# Patient Record
Sex: Female | Born: 1940
Health system: Southern US, Community
[De-identification: ages and names within clinical notes are randomized; demographics above are authoritative.]

## PROBLEM LIST (undated history)

## (undated) DIAGNOSIS — I739 Peripheral vascular disease, unspecified: Secondary | ICD-10-CM

## (undated) DIAGNOSIS — N952 Postmenopausal atrophic vaginitis: Secondary | ICD-10-CM

## (undated) DIAGNOSIS — E7800 Pure hypercholesterolemia, unspecified: Secondary | ICD-10-CM

## (undated) DIAGNOSIS — N200 Calculus of kidney: Secondary | ICD-10-CM

## (undated) DIAGNOSIS — I2510 Atherosclerotic heart disease of native coronary artery without angina pectoris: Secondary | ICD-10-CM

## (undated) DIAGNOSIS — G4700 Insomnia, unspecified: Secondary | ICD-10-CM

## (undated) DIAGNOSIS — H8110 Benign paroxysmal vertigo, unspecified ear: Secondary | ICD-10-CM

## (undated) DIAGNOSIS — Z87891 Personal history of nicotine dependence: Secondary | ICD-10-CM

## (undated) DIAGNOSIS — M069 Rheumatoid arthritis, unspecified: Secondary | ICD-10-CM

## (undated) DIAGNOSIS — M109 Gout, unspecified: Secondary | ICD-10-CM

## (undated) DIAGNOSIS — Z9049 Acquired absence of other specified parts of digestive tract: Secondary | ICD-10-CM

## (undated) DIAGNOSIS — F419 Anxiety disorder, unspecified: Secondary | ICD-10-CM

## (undated) DIAGNOSIS — N209 Urinary calculus, unspecified: Secondary | ICD-10-CM

## (undated) DIAGNOSIS — Z90710 Acquired absence of both cervix and uterus: Secondary | ICD-10-CM

## (undated) DIAGNOSIS — E079 Disorder of thyroid, unspecified: Secondary | ICD-10-CM

## (undated) DIAGNOSIS — L309 Dermatitis, unspecified: Secondary | ICD-10-CM

## (undated) DIAGNOSIS — I10 Essential (primary) hypertension: Secondary | ICD-10-CM

## (undated) DIAGNOSIS — E119 Type 2 diabetes mellitus without complications: Secondary | ICD-10-CM

## (undated) DIAGNOSIS — C4490 Unspecified malignant neoplasm of skin, unspecified: Secondary | ICD-10-CM

## (undated) DIAGNOSIS — M1990 Unspecified osteoarthritis, unspecified site: Secondary | ICD-10-CM

## (undated) DIAGNOSIS — Z8489 Family history of other specified conditions: Secondary | ICD-10-CM

## (undated) DIAGNOSIS — K219 Gastro-esophageal reflux disease without esophagitis: Secondary | ICD-10-CM

## (undated) DIAGNOSIS — E042 Nontoxic multinodular goiter: Secondary | ICD-10-CM

## (undated) DIAGNOSIS — N3000 Acute cystitis without hematuria: Secondary | ICD-10-CM

## (undated) DIAGNOSIS — G4730 Sleep apnea, unspecified: Secondary | ICD-10-CM

## (undated) DIAGNOSIS — N816 Rectocele: Secondary | ICD-10-CM

## (undated) DIAGNOSIS — N815 Vaginal enterocele: Secondary | ICD-10-CM

## (undated) DIAGNOSIS — R339 Retention of urine, unspecified: Secondary | ICD-10-CM

## (undated) HISTORY — DX: Postmenopausal atrophic vaginitis: N95.2

## (undated) HISTORY — DX: Acquired absence of both cervix and uterus: Z90.710

## (undated) HISTORY — DX: Anxiety disorder, unspecified: F41.9

## (undated) HISTORY — PX: CHOLECYSTECTOMY: SHX55

## (undated) HISTORY — DX: Gastro-esophageal reflux disease without esophagitis: K21.9

## (undated) HISTORY — DX: Acquired absence of other specified parts of digestive tract: Z90.49

## (undated) HISTORY — DX: Unspecified malignant neoplasm of skin, unspecified: C44.90

## (undated) HISTORY — DX: Personal history of nicotine dependence: Z87.891

## (undated) HISTORY — DX: Urinary calculus, unspecified: N20.9

## (undated) HISTORY — DX: Pure hypercholesterolemia, unspecified: E78.00

## (undated) HISTORY — DX: Rectocele: N81.6

## (undated) HISTORY — PX: COLONOSCOPY: SHX174

## (undated) HISTORY — DX: Sleep apnea, unspecified: G47.30

## (undated) HISTORY — DX: Type 2 diabetes mellitus without complications: E11.9

## (undated) HISTORY — DX: Disorder of thyroid, unspecified: E07.9

## (undated) HISTORY — PX: GALLBLADDER SURGERY: SHX652

## (undated) HISTORY — DX: Gout, unspecified: M10.9

## (undated) HISTORY — DX: Retention of urine, unspecified: R33.9

## (undated) HISTORY — DX: Benign paroxysmal vertigo, unspecified ear: H81.10

## (undated) HISTORY — DX: Vaginal enterocele: N81.5

## (undated) HISTORY — DX: Dermatitis, unspecified: L30.9

## (undated) HISTORY — DX: Insomnia, unspecified: G47.00

## (undated) HISTORY — DX: Acute cystitis without hematuria: N30.00

## (undated) HISTORY — PX: PARTIAL HYSTERECTOMY: SHX80

## (undated) HISTORY — DX: Calculus of kidney: N20.0

## (undated) HISTORY — DX: Unspecified osteoarthritis, unspecified site: M19.90

## (undated) HISTORY — PX: CATARACT EXTRACTION, BILATERAL: SHX1313

---

## 2003-08-14 ENCOUNTER — Ambulatory Visit (HOSPITAL_COMMUNITY): Admission: RE | Admit: 2003-08-14 | Discharge: 2003-08-14 | Payer: Self-pay | Admitting: Family Medicine

## 2004-03-03 ENCOUNTER — Ambulatory Visit (HOSPITAL_BASED_OUTPATIENT_CLINIC_OR_DEPARTMENT_OTHER): Admission: RE | Admit: 2004-03-03 | Discharge: 2004-03-03 | Payer: Self-pay | Admitting: Cardiology

## 2004-03-20 ENCOUNTER — Ambulatory Visit (HOSPITAL_COMMUNITY): Admission: RE | Admit: 2004-03-20 | Discharge: 2004-03-20 | Payer: Self-pay | Admitting: Gastroenterology

## 2004-03-20 ENCOUNTER — Encounter (INDEPENDENT_AMBULATORY_CARE_PROVIDER_SITE_OTHER): Payer: Self-pay | Admitting: Specialist

## 2004-05-06 ENCOUNTER — Ambulatory Visit: Payer: Self-pay | Admitting: Pulmonary Disease

## 2010-02-06 ENCOUNTER — Encounter: Admission: RE | Admit: 2010-02-06 | Discharge: 2010-02-06 | Payer: Self-pay | Admitting: Internal Medicine

## 2010-02-11 ENCOUNTER — Encounter: Admission: RE | Admit: 2010-02-11 | Discharge: 2010-02-11 | Payer: Self-pay | Admitting: Internal Medicine

## 2010-02-11 ENCOUNTER — Other Ambulatory Visit: Admission: RE | Admit: 2010-02-11 | Discharge: 2010-02-11 | Payer: Self-pay | Admitting: Interventional Radiology

## 2010-06-10 ENCOUNTER — Encounter
Admission: RE | Admit: 2010-06-10 | Discharge: 2010-06-10 | Payer: Self-pay | Source: Home / Self Care | Admitting: Internal Medicine

## 2010-09-29 ENCOUNTER — Other Ambulatory Visit: Payer: Self-pay | Admitting: Obstetrics and Gynecology

## 2010-09-29 DIAGNOSIS — R102 Pelvic and perineal pain: Secondary | ICD-10-CM

## 2010-10-06 ENCOUNTER — Ambulatory Visit (HOSPITAL_COMMUNITY)
Admission: RE | Admit: 2010-10-06 | Discharge: 2010-10-06 | Disposition: A | Discharge status: Home / Self Care | Payer: Medicare Other | Source: Ambulatory Visit | Attending: Obstetrics and Gynecology | Admitting: Obstetrics and Gynecology

## 2010-10-06 DIAGNOSIS — N949 Unspecified condition associated with female genital organs and menstrual cycle: Secondary | ICD-10-CM | POA: Insufficient documentation

## 2010-10-06 DIAGNOSIS — R102 Pelvic and perineal pain: Secondary | ICD-10-CM

## 2010-10-06 DIAGNOSIS — Z90710 Acquired absence of both cervix and uterus: Secondary | ICD-10-CM | POA: Insufficient documentation

## 2010-11-20 NOTE — Op Note (Signed)
Annette Suarez, Annette Suarez                            ACCOUNT NO.:  [id number]   MEDICAL RECORD NO.:  [id number]                   PATIENT TYPE:  AMB   LOCATION:  ENDO                                 FACILITY:  [facility]   PHYSICIAN:  John C. Madilyn Fireman, M.D.                 DATE OF BIRTH:  1941/04/16   DATE OF PROCEDURE:  03/20/2004  DATE OF DISCHARGE:                                 OPERATIVE REPORT   OPERATION:  Colonoscopy with polypectomy.   INDICATIONS FOR PROCEDURE:  Heme positive stool.   PROCEDURE:  The patient was placed in the left lateral decubitus position  and placed on the pulse monitor with continuous low flow oxygen delivered by  nasal cannula.  She was sedated with 75 mcg IV Fentanyl and 8 mg IV Versed.  The Olympus video colonoscope was inserted into the rectum and advanced to  the cecum, confirmed by transillumination of McBurney's point and  visualization of the ileocecal valve and appendiceal orifice.  The prep was  excellent.  The cecum, ascending, transverse, descending, and sigmoid colon  all appeared normal with no masses, polyps, diverticula or other mucosal  abnormalities.  At the rectosigmoid junction, there was an 8 mm relatively  flat polyp which was fulgurated by hot biopsy.  The remainder of the rectum  and sigmoid appeared normal down to the anus where a retroflex view did  reveal some small internal hemorrhoids.  The scope was then withdrawn and  the patient returned to the recovery room in stable condition.  She  tolerated the procedure well and there were no immediate complications.   IMPRESSION:  1.  Small rectosigmoid colon polyp.  2.  Small internal hemorrhoids.   PLAN:  Await histology to determine method and interval for future colon  screening.                                               John C. Madilyn Fireman, M.D.    JCH/MEDQ  D:  03/20/2004  T:  03/20/2004  Job:  [id number]   cc:   Holley Bouche, M.D.  [address]  [place], [place] [address]  Fax: [phone]

## 2010-11-20 NOTE — Procedures (Signed)
NAME:  Annette Suarez, Annette Suarez                ACCOUNT NO.:  [id number]   MEDICAL RECORD NO.:  [id number]          PATIENT TYPE:  OUT   LOCATION:  SLEEP CENTER                 FACILITY:  [facility]   PHYSICIAN:  Clinton D. Maple Hudson, M.D. DATE OF BIRTH:  09-05-1940   DATE OF ADMISSION:  03/03/2004  DATE OF DISCHARGE:  03/03/2004                              NOCTURNAL POLYSOMNOGRAM   REFERRING PHYSICIAN:  Dr. Armanda Magic   INDICATION FOR STUDY:  Hypersomnia with sleep apnea, daytime fatigue,  complains of waking at night with a jerk.   EPWORTH SLEEPINESS SCORE:  6/24   NECK SIZE:  15 inches.   BMI:  27.   WEIGHT:  151 pounds.   MEDICATION LIST:  Estradiol, Glipizide, enalapril, Zocor, aspirin, calcium.   SLEEP ARCHITECTURE:  Short total sleep time 283 minutes with sleep  efficiency 57%.  Stage I was 6%, Stage II 44%, Stages III 23%.  REM was 27%  of total sleep time.  Latency to sleep onset 48 minutes, latency to REM 85  minutes, awake after sleep onset 162 minutes, arousal index 23/hr.   RESPIRATORY DATA:  Split-study protocol. RDI 18/hr before CPAP reflecting 2  obstructive apneas and 47 hypopneas before CPAP, consistent with mild  obstructive sleep apnea. Events were not positional. REM RDI 32/hr. CPAP was  titrated to 10 CWP, RDI 06/hr.  A Respironics Comfort Curve Mask with small  nasal pillows and heated humidifier was used.   OXYGEN DATA:  Moderate snoring before CPAP with mild to moderate oxygen  desaturation briefly to 85%. Mean oxygen saturation through the study 95-96%  and oxygenation was normal with CPAP control.   CARDIAC DATA:  Sinus rhythm with PACs.   MOVEMENT/PARASOMNIA:  38 limb jerks were recorded of which 3 were associated  with arousal or awakening for a periodic limb movement with arousal index of  0.6/hr.   IMPRESSION/RECOMMENDATION:  Mild obstructive sleep apnea/hypopnea syndrome,  respiratory disturbance index 18/hr.  Good control on CPAP at 10 CWP, using  a small  Comfort Curv Respironics Mask with heated humidifier.                                   ______________________________                                Rennis Chris Maple Hudson, M.D.                                Diplomate, Biomedical engineer of Sleep Medicine    CDY/MEDQ  D:  03/08/2004 12:27:32  T:  03/09/2004 15:34:41  Job:  [id number]

## 2010-12-23 ENCOUNTER — Other Ambulatory Visit: Payer: Self-pay | Admitting: Family Medicine

## 2010-12-23 DIAGNOSIS — G8929 Other chronic pain: Secondary | ICD-10-CM

## 2010-12-30 ENCOUNTER — Ambulatory Visit
Admission: RE | Admit: 2010-12-30 | Discharge: 2010-12-30 | Disposition: A | Discharge status: Home / Self Care | Payer: Medicare Other | Source: Ambulatory Visit | Attending: Family Medicine | Admitting: Family Medicine

## 2010-12-30 DIAGNOSIS — M542 Cervicalgia: Secondary | ICD-10-CM

## 2010-12-30 DIAGNOSIS — G8929 Other chronic pain: Secondary | ICD-10-CM

## 2011-05-12 ENCOUNTER — Other Ambulatory Visit: Payer: Self-pay | Admitting: Internal Medicine

## 2011-05-12 DIAGNOSIS — E042 Nontoxic multinodular goiter: Secondary | ICD-10-CM

## 2011-05-17 ENCOUNTER — Ambulatory Visit
Admission: RE | Admit: 2011-05-17 | Discharge: 2011-05-17 | Disposition: A | Discharge status: Home / Self Care | Payer: Medicare Other | Source: Ambulatory Visit | Attending: Internal Medicine | Admitting: Internal Medicine

## 2011-05-17 DIAGNOSIS — E042 Nontoxic multinodular goiter: Secondary | ICD-10-CM

## 2011-09-09 ENCOUNTER — Ambulatory Visit: Payer: Medicare Other | Admitting: Gynecology

## 2012-02-17 ENCOUNTER — Other Ambulatory Visit: Payer: Self-pay | Admitting: Family Medicine

## 2012-02-17 DIAGNOSIS — H539 Unspecified visual disturbance: Secondary | ICD-10-CM

## 2012-02-23 ENCOUNTER — Other Ambulatory Visit: Payer: Medicare Other

## 2012-02-24 ENCOUNTER — Ambulatory Visit
Admission: RE | Admit: 2012-02-24 | Discharge: 2012-02-24 | Disposition: A | Discharge status: Home / Self Care | Payer: Medicare Other | Source: Ambulatory Visit | Attending: Family Medicine | Admitting: Family Medicine

## 2012-02-24 DIAGNOSIS — H539 Unspecified visual disturbance: Secondary | ICD-10-CM

## 2012-05-01 ENCOUNTER — Other Ambulatory Visit: Payer: Self-pay | Admitting: Internal Medicine

## 2012-05-01 DIAGNOSIS — E049 Nontoxic goiter, unspecified: Secondary | ICD-10-CM

## 2012-05-10 ENCOUNTER — Ambulatory Visit
Admission: RE | Admit: 2012-05-10 | Discharge: 2012-05-10 | Disposition: A | Discharge status: Home / Self Care | Payer: Medicare Other | Source: Ambulatory Visit | Attending: Internal Medicine | Admitting: Internal Medicine

## 2012-05-10 DIAGNOSIS — E049 Nontoxic goiter, unspecified: Secondary | ICD-10-CM

## 2014-08-08 ENCOUNTER — Other Ambulatory Visit: Payer: Self-pay | Admitting: Dermatology

## 2015-04-15 ENCOUNTER — Other Ambulatory Visit: Payer: Self-pay | Admitting: Internal Medicine

## 2015-04-15 DIAGNOSIS — E042 Nontoxic multinodular goiter: Secondary | ICD-10-CM

## 2015-04-23 ENCOUNTER — Ambulatory Visit
Admission: RE | Admit: 2015-04-23 | Discharge: 2015-04-23 | Disposition: A | Discharge status: Home / Self Care | Payer: Medicare Other | Source: Ambulatory Visit | Attending: Internal Medicine | Admitting: Internal Medicine

## 2015-04-23 DIAGNOSIS — E042 Nontoxic multinodular goiter: Secondary | ICD-10-CM

## 2016-03-25 ENCOUNTER — Other Ambulatory Visit: Payer: Self-pay | Admitting: Gastroenterology

## 2016-03-25 DIAGNOSIS — R1310 Dysphagia, unspecified: Secondary | ICD-10-CM

## 2016-03-30 ENCOUNTER — Ambulatory Visit
Admission: RE | Admit: 2016-03-30 | Discharge: 2016-03-30 | Disposition: A | Discharge status: Home / Self Care | Payer: Medicare Other | Source: Ambulatory Visit | Attending: Gastroenterology | Admitting: Gastroenterology

## 2016-03-30 DIAGNOSIS — R1310 Dysphagia, unspecified: Secondary | ICD-10-CM

## 2016-06-10 ENCOUNTER — Other Ambulatory Visit (HOSPITAL_COMMUNITY): Payer: Self-pay | Admitting: Family Medicine

## 2016-06-10 ENCOUNTER — Other Ambulatory Visit: Payer: Self-pay | Admitting: Family Medicine

## 2016-06-10 ENCOUNTER — Ambulatory Visit
Admission: RE | Admit: 2016-06-10 | Discharge: 2016-06-10 | Disposition: A | Discharge status: Home / Self Care | Payer: Medicare Other | Source: Ambulatory Visit | Attending: Family Medicine | Admitting: Family Medicine

## 2016-06-10 DIAGNOSIS — M25541 Pain in joints of right hand: Secondary | ICD-10-CM

## 2016-06-14 ENCOUNTER — Other Ambulatory Visit: Payer: Self-pay | Admitting: Family Medicine

## 2016-06-14 DIAGNOSIS — M79641 Pain in right hand: Secondary | ICD-10-CM

## 2016-06-15 ENCOUNTER — Ambulatory Visit
Admission: RE | Admit: 2016-06-15 | Discharge: 2016-06-15 | Disposition: A | Discharge status: Home / Self Care | Payer: Medicare Other | Source: Ambulatory Visit | Attending: Family Medicine | Admitting: Family Medicine

## 2016-06-15 ENCOUNTER — Other Ambulatory Visit: Payer: Self-pay | Admitting: Family Medicine

## 2016-06-15 DIAGNOSIS — M79641 Pain in right hand: Secondary | ICD-10-CM

## 2016-06-15 DIAGNOSIS — R9389 Abnormal findings on diagnostic imaging of other specified body structures: Secondary | ICD-10-CM

## 2016-06-15 MED ORDER — GADOBENATE DIMEGLUMINE 529 MG/ML IV SOLN
15.0000 mL | Freq: Once | INTRAVENOUS | Status: AC | PRN
  Administered 2016-06-15: 13 mL via INTRAVENOUS

## 2016-06-25 ENCOUNTER — Other Ambulatory Visit: Payer: Medicare Other

## 2016-07-05 HISTORY — PX: CARPAL TUNNEL RELEASE: SHX101

## 2016-09-06 ENCOUNTER — Telehealth: Payer: Self-pay | Admitting: Rheumatology

## 2016-09-06 NOTE — Telephone Encounter (Signed)
Opened in error

## 2016-09-07 NOTE — Progress Notes (Signed)
Office Visit Note  Patient: Annette Suarez             Date of Birth: [birth year]           MRN: [id number]             PCP: Shirline Frees, MD Referring: Delrae Rend, MD Visit Date: 09/08/2016 Occupation: @GUAROCC @    Subjective:  Pain hands   History of Present Illness: Annette Suarez is a 76 y.o. female seen in consultation per request of Dr. Buddy Duty. According to patient in early December she started experiencing hand pain and swelling. At the time she was seen by her PCP and had x-rays of her bilateral hands which were abnormal as was followed by MRI of her hands which was also abnormal. She was given some medication which did not help. She also had some lab work which was unremarkable. She states she was given meloxicam and Tylenol arthritis was advised. It is not helping her much. She also complains of Raynaud's phenomena  which is been lasting for over a year now. She denies pain and swelling in any other joints.  Activities of Daily Living:  Patient reports morning stiffness for 4-5 hours.   Patient Reports nocturnal pain.  Difficulty dressing/grooming: Denies Difficulty climbing stairs: Denies Difficulty getting out of chair: Denies Difficulty using hands for taps, buttons, cutlery, and/or writing: Reports   Review of Systems  Constitutional: Positive for fatigue. Negative for night sweats, weight gain, weight loss and weakness.  HENT: Positive for mouth dryness. Negative for mouth sores, trouble swallowing, trouble swallowing and nose dryness.   Eyes: Negative for pain, redness, visual disturbance and dryness.  Respiratory: Negative for cough, shortness of breath and difficulty breathing.   Cardiovascular: Negative for chest pain, palpitations, hypertension, irregular heartbeat and swelling in legs/feet.  Gastrointestinal: Negative for blood in stool, constipation and diarrhea.  Endocrine: Negative for increased urination.  Genitourinary: Negative for vaginal dryness.    Musculoskeletal: Positive for arthralgias, joint pain, joint swelling and morning stiffness. Negative for myalgias, muscle weakness, muscle tenderness and myalgias.  Skin: Positive for color change. Negative for rash, hair loss, skin tightness, ulcers and sensitivity to sunlight.  Allergic/Immunologic: Negative for susceptible to infections.  Neurological: Negative for dizziness, memory loss and night sweats.  Hematological: Negative for swollen glands.  Psychiatric/Behavioral: Positive for sleep disturbance. Negative for depressed mood. The patient is not nervous/anxious.     PMFS History:  Patient Active Problem List   Diagnosis Date Noted  . Pain in both hands 09/08/2016  . Essential hypertension 09/08/2016  . Type 2 diabetes mellitus  09/08/2016  . Gastroesophageal reflux disease without esophagitis 09/08/2016  . Osteopenia of multiple sites 09/08/2016  . History of anxiety 09/08/2016  . Benign paroxysmal positional vertigo 09/08/2016  . Smoker 09/08/2016  . Sleep apnea 09/08/2016    No past medical history on file.  No family history on file. No past surgical history on file. Social History   Social History Narrative  . No narrative on file     Objective: Vital Signs: BP 138/78 (BP Location: Right Arm)   Pulse 78   Resp 16   Ht 5\' 1"  (1.549 m)   Wt 150 lb (68 kg)   BMI 28.34 kg/m    Physical Exam  Constitutional: She is oriented to person, place, and time. She appears well-developed and well-nourished.  HENT:  Head: Normocephalic and atraumatic.  Eyes: Conjunctivae and EOM are normal.  Neck: Normal range of motion.  Cardiovascular: Normal rate, regular rhythm, normal heart sounds and intact distal pulses.   Pulmonary/Chest: Effort normal and breath sounds normal.  Abdominal: Soft. Bowel sounds are normal.  Lymphadenopathy:    She has no cervical adenopathy.  Neurological: She is alert and oriented to person, place, and time.  Skin: Skin is warm and dry.  Capillary refill takes less than 2 seconds.  Psychiatric: She has a normal mood and affect. Her behavior is normal.  Nursing note and vitals reviewed.    Musculoskeletal Exam: C-spine, thoracic spine good range of motion. Shoulder joints elbow joints are good range of motion. She has limited range of motion of bilateral wrist joints right wrist joint extension 40 and flexion 20, left wrist joint synovitis with extension 40 and flexion 45 she has synovitis on palpation of bilateral MCPs and PIP joints as depicted below. Hip joints knee joints ankles MTPs PIPs DIPs with good range of motion with no synovitis.  CDAI Exam: CDAI Homunculus Exam:   Tenderness:  RUE: wrist LUE: wrist Right hand: 1st MCP, 2nd MCP, 3rd MCP, 4th MCP, 5th MCP, 2nd PIP, 3rd PIP, 4th PIP and 5th PIP Left hand: 1st MCP, 2nd MCP, 3rd MCP, 4th MCP, 5th MCP, 2nd PIP, 3rd PIP, 4th PIP and 5th PIP  Swelling:  RUE: wrist LUE: wrist Right hand: 1st MCP, 2nd MCP, 3rd MCP, 5th MCP, 2nd PIP, 3rd PIP, 4th PIP and 5th PIP Left hand: 2nd MCP, 3rd MCP, 5th MCP, 2nd PIP, 3rd PIP, 4th PIP and 5th PIP  Joint Counts:  CDAI Tender Joint count: 20 CDAI Swollen Joint count: 17  Global Assessments:  Patient Global Assessment: 8 Provider Global Assessment: 8  CDAI Calculated Score: 53    Investigation: Findings:  07/15/2016 RF negative, ANA negative 04/12/2016 CMP normal, hemoglobin A1c 7.8, TSH 1.24 02/20/2016 CBC WBC 11.1, hemoglobin 13.9, platelets 317 IMPRESSION: MRI hand 1. Synovitis and suspected mild erosions along the index finger MCP joint. Lesser degree of synovitis in the fifth MCP joint and along the ring finger proximal interphalangeal joint. There is some scattered small erosions. Appearance suspicious for erosive arthropathy such as rheumatoid arthropathy. Given the multifocal involvement, I doubt that this is an example of septic joint. 2. Superimposed osteoarthritis especially notable in  the interphalangeal joints. Electronically Signed   By: Van Clines M.D.   On: 06/16/2016 09:02 06/15/2012 DEXA T score -1.6 left femoral    Imaging: Xr Hand 2 View Left  Result Date: 09/08/2016 Juxta articular osteopenia. Left second MCP joint narrowing was noted. All PIP joint space narrowing was noted. No erosive changes were noted. Impression: These signs are consistent with inflammatory arthritis and osteoarthritis overlap   Speciality Comments: No specialty comments available.    Procedures:  No procedures performed Allergies: Latex   Assessment / Plan:     Visit Diagnoses: Rheumatoid arthritis seronegative: Patient is symmetrical inflammatory arthritis involving wrist joint MCPs and PIP joints. She had MRI revealing synovitis and possible erosive changes. All her autoimmune workup so far including ANA and rheumatoid factor is negative. She's alarmed discomfort with nocturnal pain and incomplete fist formation she's having difficulty doing routine activities at home. She will benefit from prednisone as a bridging therapy but I'm hesitant to give her an adequate dose due to poorly controlled blood sugars. She states her blood sugar last night was 180. My plan is to keep her on prednisone 5 mg by mouth daily to relieve some of the stiffness in her hands. I will also  obtain following labs and plan to start her on methotrexate after the lab work. She'll also need folic acid 1 mg 2 tablets by mouth daily. We will have to monitor her blood work very closely every 2 weeks 3 and then every 2 months to monitor for drug toxicity. Indications side effects contraindications of methotrexate were discussed at length by me and Dr. Koleen Nimrod today. We will obtain an informed consent. A baseline chest x-ray will be also obtained. May need a pneumococcal vaccine as well.  Essential hypertension: Her blood pressure is fairly well controlled.  Type 2 diabetes mellitus : I have advised her to  monitor her blood sugars closely and then follow-up with Dr. Buddy Duty.  Dyslipidemia: According to patient her lipids have not been high and she is taking statins because of history of diabetes.  Gastroesophageal reflux disease without esophagitis: Not very symptomatic  History of anxiety  Benign paroxysmal positional vertigo  Sleep apnea  Former smoker   Orders: Orders Placed This Encounter  Procedures  . XR Hand 2 View Left  . DG Chest 2 View  . CBC with Differential/Platelet  . COMPLETE METABOLIC PANEL WITH GFR  . Sedimentation rate  . Cyclic citrul peptide antibody, IgG  . 14-3-3 eta Protein  . Angiotensin converting enzyme  . Hepatitis C antibody  . Hepatitis B Surface AntiGEN  . Hepatitis B Core Antibody, IgM  . Hepatitis A antibody, IgM  . Serum protein electrophoresis with reflex  . IgG, IgA, IgM  . HIV antibody  . Urinalysis, Routine w reflex microscopic  . Quantiferon tb gold assay (blood)  . CBC with Differential/Platelet  . COMPLETE METABOLIC PANEL WITH GFR   Meds ordered this encounter  Medications  . predniSONE (DELTASONE) 5 MG tablet    Sig: Take 1 tablet (5 mg total) by mouth daily with breakfast.    Dispense:  30 tablet    Refill:  1    Face-to-face time spent with patient was 50 minutes. 50% of time was spent in counseling and coordination of care.  Follow-Up Instructions: Return for Rheumatoid arthritis.   Bo Merino, MD  Note - This record has been created using Editor, commissioning.  Chart creation errors have been sought, but may not always  have been located. Such creation errors do not reflect on  the standard of medical care.

## 2016-09-08 ENCOUNTER — Ambulatory Visit (INDEPENDENT_AMBULATORY_CARE_PROVIDER_SITE_OTHER): Payer: Medicare Other | Admitting: Rheumatology

## 2016-09-08 ENCOUNTER — Ambulatory Visit (HOSPITAL_COMMUNITY)
Admission: RE | Admit: 2016-09-08 | Discharge: 2016-09-08 | Disposition: A | Discharge status: Home / Self Care | Payer: Medicare Other | Source: Ambulatory Visit | Attending: Rheumatology | Admitting: Rheumatology

## 2016-09-08 ENCOUNTER — Ambulatory Visit (INDEPENDENT_AMBULATORY_CARE_PROVIDER_SITE_OTHER): Payer: Medicare Other

## 2016-09-08 ENCOUNTER — Telehealth: Payer: Self-pay | Admitting: Radiology

## 2016-09-08 ENCOUNTER — Encounter: Payer: Self-pay | Admitting: Rheumatology

## 2016-09-08 VITALS — BP 138/78 | HR 78 | Resp 16 | Ht 61.0 in | Wt 150.0 lb

## 2016-09-08 DIAGNOSIS — I10 Essential (primary) hypertension: Secondary | ICD-10-CM | POA: Diagnosis not present

## 2016-09-08 DIAGNOSIS — K219 Gastro-esophageal reflux disease without esophagitis: Secondary | ICD-10-CM | POA: Diagnosis not present

## 2016-09-08 DIAGNOSIS — M79641 Pain in right hand: Secondary | ICD-10-CM | POA: Diagnosis not present

## 2016-09-08 DIAGNOSIS — M79642 Pain in left hand: Secondary | ICD-10-CM | POA: Diagnosis not present

## 2016-09-08 DIAGNOSIS — E118 Type 2 diabetes mellitus with unspecified complications: Secondary | ICD-10-CM

## 2016-09-08 DIAGNOSIS — E1159 Type 2 diabetes mellitus with other circulatory complications: Secondary | ICD-10-CM | POA: Insufficient documentation

## 2016-09-08 DIAGNOSIS — E119 Type 2 diabetes mellitus without complications: Secondary | ICD-10-CM | POA: Insufficient documentation

## 2016-09-08 DIAGNOSIS — Z79899 Other long term (current) drug therapy: Secondary | ICD-10-CM

## 2016-09-08 DIAGNOSIS — M0600 Rheumatoid arthritis without rheumatoid factor, unspecified site: Secondary | ICD-10-CM | POA: Diagnosis not present

## 2016-09-08 DIAGNOSIS — H8110 Benign paroxysmal vertigo, unspecified ear: Secondary | ICD-10-CM

## 2016-09-08 DIAGNOSIS — Z8659 Personal history of other mental and behavioral disorders: Secondary | ICD-10-CM

## 2016-09-08 DIAGNOSIS — Z111 Encounter for screening for respiratory tuberculosis: Secondary | ICD-10-CM | POA: Diagnosis not present

## 2016-09-08 DIAGNOSIS — Z1159 Encounter for screening for other viral diseases: Secondary | ICD-10-CM

## 2016-09-08 DIAGNOSIS — G4730 Sleep apnea, unspecified: Secondary | ICD-10-CM

## 2016-09-08 DIAGNOSIS — M8589 Other specified disorders of bone density and structure, multiple sites: Secondary | ICD-10-CM

## 2016-09-08 DIAGNOSIS — F17200 Nicotine dependence, unspecified, uncomplicated: Secondary | ICD-10-CM

## 2016-09-08 DIAGNOSIS — Z794 Long term (current) use of insulin: Secondary | ICD-10-CM

## 2016-09-08 DIAGNOSIS — Z114 Encounter for screening for human immunodeficiency virus [HIV]: Secondary | ICD-10-CM

## 2016-09-08 DIAGNOSIS — I152 Hypertension secondary to endocrine disorders: Secondary | ICD-10-CM | POA: Insufficient documentation

## 2016-09-08 LAB — COMPLETE METABOLIC PANEL WITH GFR
ALT: 7 U/L (ref 6–29)
AST: 9 U/L — ABNORMAL LOW (ref 10–35)
Albumin: 3.9 g/dL (ref 3.6–5.1)
Alkaline Phosphatase: 84 U/L (ref 33–130)
BUN: 23 mg/dL (ref 7–25)
CO2: 23 mmol/L (ref 20–31)
Calcium: 9.2 mg/dL (ref 8.6–10.4)
Chloride: 100 mmol/L (ref 98–110)
Creat: 0.82 mg/dL (ref 0.60–0.93)
GFR, Est African American: 82 mL/min (ref >=60)
GFR, Est Non African American: 71 mL/min (ref >=60)
Glucose, Bld: 132 mg/dL — ABNORMAL HIGH (ref 65–99)
Potassium: 4.4 mmol/L (ref 3.5–5.3)
Sodium: 140 mmol/L (ref 135–146)
Total Bilirubin: 0.8 mg/dL (ref 0.2–1.2)
Total Protein: 6.9 g/dL (ref 6.1–8.1)

## 2016-09-08 LAB — CBC WITH DIFFERENTIAL/PLATELET
Basophils Absolute: 0 cells/uL (ref 0–200)
Basophils Relative: 0 %
Eosinophils Absolute: 240 cells/uL (ref 15–500)
Eosinophils Relative: 2 %
HCT: 33.5 % — ABNORMAL LOW (ref 35.0–45.0)
Hemoglobin: 11.4 g/dL — ABNORMAL LOW (ref 11.7–15.5)
Lymphocytes Relative: 15 %
Lymphs Abs: 1800 cells/uL (ref 850–3900)
MCH: 30.2 pg (ref 27.0–33.0)
MCHC: 34 g/dL (ref 32.0–36.0)
MCV: 88.6 fL (ref 80.0–100.0)
MPV: 9.7 fL (ref 7.5–12.5)
Monocytes Absolute: 600 cells/uL (ref 200–950)
Monocytes Relative: 5 %
Neutro Abs: 9360 cells/uL — ABNORMAL HIGH (ref 1500–7800)
Neutrophils Relative %: 78 %
Platelets: 426 10*3/uL — ABNORMAL HIGH (ref 140–400)
RBC: 3.78 MIL/uL — ABNORMAL LOW (ref 3.80–5.10)
RDW: 13.4 % (ref 11.0–15.0)
WBC: 12 10*3/uL — ABNORMAL HIGH (ref 3.8–10.8)

## 2016-09-08 MED ORDER — PREDNISONE 5 MG PO TABS: 5.0000 mg | ORAL_TABLET | Freq: Every day | ORAL | 1 refills | Status: DC

## 2016-09-08 NOTE — Progress Notes (Signed)
Pharmacy Note  Subjective: Patient presents today to the [facility] to see Dr. Estanislado Pandy.  Patient seen by the pharmacist for counseling on methotrexate.    Objective: CBC, CMP: ordered today TB Gold: ordered today Hepatitis panel: ordered today HIV: ordered today  Chest-xray:  Ordered today  Contraception: post-menopausal  Assessment/Plan:  Patient was counseled on the purpose, proper use, and adverse effects of methotrexate including nausea, infection, and signs and symptoms of pneumonitis.  Reviewed instructions with patient to take methotrexate weekly along with folic acid daily.  Discussed the importance of frequent monitoring of kidney and liver function and blood counts.  Counseled patient to avoid NSAIDs and alcohol while on methotrexate.  Provided patient with educational materials on methotrexate and answered all questions.  Advised patient to get annual influenza vaccine and to get a pneumococcal vaccine if patient has not already had one.  Patient voiced understanding.  Patient consented to methotrexate use.  Will upload into chart.  Will plan to initiate methotrexate once patient's labs and chest x-ray returns.  Patient prefers to use subcutaneous methotrexate.  Educated patient on how to use a vial and syringe and reviewed injection technique with patient.  Patient was able to demonstrate proper technique for injections using vial and syringe.  Provided patient on educational material regarding injection technique and storage of methotrexate.    Patient was also started on prednisone 5 mg daily.  Counseled patient on the purpose, proper use, and adverse effects of prednisone.  Advised patient to closely monitor her blood glucose and contact her endocrinologist if her blood sugars increase while on low dose prednisone.  Patient voiced understanding.    Elisabeth Most, Pharm.D., BCPS Clinical Pharmacist Pager: [phone] Phone: [phone] 09/08/2016 8:57  AM

## 2016-09-08 NOTE — Telephone Encounter (Signed)
New patient/ plan is to start injectable Methotrexate when labs result. To you FYI you will likely see labs   She knows how to inject, she uses injectable insulin

## 2016-09-08 NOTE — Progress Notes (Signed)
normal

## 2016-09-08 NOTE — Patient Instructions (Addendum)
Check with your primary care to make sure you have had your pneumonia vaccines/ go to [facility] today for your chest xray. You do not need an appointment.     Methotrexate tablets What is this medicine? METHOTREXATE (METH oh TREX ate) is a chemotherapy drug used to treat cancer including breast cancer, leukemia, and lymphoma. This medicine can also be used to treat psoriasis and certain kinds of arthritis. This medicine may be used for other purposes; ask your health care provider or pharmacist if you have questions. COMMON BRAND NAME(S): Rheumatrex, Trexall What should I tell my health care provider before I take this medicine? They need to know if you have any of these conditions: -fluid in the stomach area or lungs -if you often drink alcohol -infection or immune system problems -kidney disease or on hemodialysis -liver disease -low blood counts, like low white cell, platelet, or red cell counts -lung disease -radiation therapy -stomach ulcers -ulcerative colitis -an unusual or allergic reaction to methotrexate, other medicines, foods, dyes, or preservatives -pregnant or trying to get pregnant -breast-feeding How should I use this medicine? Take this medicine by mouth with a glass of water. Follow the directions on the prescription label. Take your medicine at regular intervals. Do not take it more often than directed. Do not stop taking except on your doctor's advice. Make sure you know why you are taking this medicine and how often you should take it. If this medicine is used for a condition that is not cancer, like arthritis or psoriasis, it should be taken weekly, NOT daily. Taking this medicine more often than directed can cause serious side effects, even death. Talk to your healthcare provider about safe handling and disposal of this medicine. You may need to take special precautions. Talk to your pediatrician regarding the use of this medicine in children. While this  drug may be prescribed for selected conditions, precautions do apply. Overdosage: If you think you have taken too much of this medicine contact a poison control center or emergency room at once. NOTE: This medicine is only for you. Do not share this medicine with others. What if I miss a dose? If you miss a dose, talk with your doctor or health care professional. Do not take double or extra doses. What may interact with this medicine? This medicine may interact with the following medication: -acitretin -aspirin and aspirin-like medicines including salicylates -azathioprine -certain antibiotics like penicillins, tetracycline, and chloramphenicol -cyclosporine -gold -hydroxychloroquine -live virus vaccines -NSAIDs, medicines for pain and inflammation, like ibuprofen or naproxen -other cytotoxic agents -penicillamine -phenylbutazone -phenytoin -probenecid -retinoids such as isotretinoin and tretinoin -steroid medicines like prednisone or cortisone -sulfonamides like sulfasalazine and trimethoprim/sulfamethoxazole -theophylline This list may not describe all possible interactions. Give your health care provider a list of all the medicines, herbs, non-prescription drugs, or dietary supplements you use. Also tell them if you smoke, drink alcohol, or use illegal drugs. Some items may interact with your medicine. What should I watch for while using this medicine? Avoid alcoholic drinks. This medicine can make you more sensitive to the sun. Keep out of the sun. If you cannot avoid being in the sun, wear protective clothing and use sunscreen. Do not use sun lamps or tanning beds/booths. You may need blood work done while you are taking this medicine. Call your doctor or health care professional for advice if you get a fever, chills or sore throat, or other symptoms of a cold or flu. Do not treat yourself. This  drug decreases your body's ability to fight infections. Try to avoid being around  people who are sick. This medicine may increase your risk to bruise or bleed. Call your doctor or health care professional if you notice any unusual bleeding. Check with your doctor or health care professional if you get an attack of severe diarrhea, nausea and vomiting, or if you sweat a lot. The loss of too much body fluid can make it dangerous for you to take this medicine. Talk to your doctor about your risk of cancer. You may be more at risk for certain types of cancers if you take this medicine. Both men and women must use effective birth control with this medicine. Do not become pregnant while taking this medicine or until at least 1 normal menstrual cycle has occurred after stopping it. Women should inform their doctor if they wish to become pregnant or think they might be pregnant. Men should not father a child while taking this medicine and for 3 months after stopping it. There is a potential for serious side effects to an unborn child. Talk to your health care professional or pharmacist for more information. Do not breast-feed an infant while taking this medicine. What side effects may I notice from receiving this medicine? Side effects that you should report to your doctor or health care professional as soon as possible: -allergic reactions like skin rash, itching or hives, swelling of the face, lips, or tongue -breathing problems or shortness of breath -diarrhea -dry, nonproductive cough -low blood counts - this medicine may decrease the number of white blood cells, red blood cells and platelets. You may be at increased risk for infections and bleeding. -mouth sores -redness, blistering, peeling or loosening of the skin, including inside the mouth -signs of infection - fever or chills, cough, sore throat, pain or trouble passing urine -signs and symptoms of bleeding such as bloody or black, tarry stools; red or dark-brown urine; spitting up blood or brown material that looks like coffee  grounds; red spots on the skin; unusual bruising or bleeding from the eye, gums, or nose -signs and symptoms of kidney injury like trouble passing urine or change in the amount of urine -signs and symptoms of liver injury like dark yellow or brown urine; general ill feeling or flu-like symptoms; light-colored stools; loss of appetite; nausea; right upper belly pain; unusually weak or tired; yellowing of the eyes or skin Side effects that usually do not require medical attention (report to your doctor or health care professional if they continue or are bothersome): -dizziness -hair loss -tiredness -upset stomach -vomiting This list may not describe all possible side effects. Call your doctor for medical advice about side effects. You may report side effects to FDA at 1-800-FDA-1088. Where should I keep my medicine? Keep out of the reach of children. Store at room temperature between 20 and 25 degrees C (68 and 77 degrees F). Protect from light. Throw away any unused medicine after the expiration date. NOTE: This sheet is a summary. It may not cover all possible information. If you have questions about this medicine, talk to your doctor, pharmacist, or health care provider.  2018 [employer] Standard (2015-02-24 05:39:22)  Prednisone tablets What is this medicine? PREDNISONE (PRED ni sone) is a corticosteroid. It is commonly used to treat inflammation of the skin, joints, lungs, and other organs. Common conditions treated include asthma, allergies, and arthritis. It is also used for other conditions, such as blood disorders and diseases of the adrenal  glands. This medicine may be used for other purposes; ask your health care provider or pharmacist if you have questions. COMMON BRAND NAME(S): Deltasone, Predone, Sterapred, Sterapred DS What should I tell my health care provider before I take this medicine? They need to know if you have any of these conditions: -Cushing's  syndrome -diabetes -glaucoma -heart disease -high blood pressure -infection (especially a virus infection such as chickenpox, cold sores, or herpes) -kidney disease -liver disease -mental illness -myasthenia gravis -osteoporosis -seizures -stomach or intestine problems -thyroid disease -an unusual or allergic reaction to lactose, prednisone, other medicines, foods, dyes, or preservatives -pregnant or trying to get pregnant -breast-feeding How should I use this medicine? Take this medicine by mouth with a glass of water. Follow the directions on the prescription label. Take this medicine with food. If you are taking this medicine once a day, take it in the morning. Do not take more medicine than you are told to take. Do not suddenly stop taking your medicine because you may develop a severe reaction. Your doctor will tell you how much medicine to take. If your doctor wants you to stop the medicine, the dose may be slowly lowered over time to avoid any side effects. Talk to your pediatrician regarding the use of this medicine in children. Special care may be needed. Overdosage: If you think you have taken too much of this medicine contact a poison control center or emergency room at once. NOTE: This medicine is only for you. Do not share this medicine with others. What if I miss a dose? If you miss a dose, take it as soon as you can. If it is almost time for your next dose, talk to your doctor or health care professional. You may need to miss a dose or take an extra dose. Do not take double or extra doses without advice. What may interact with this medicine? Do not take this medicine with any of the following medications: -metyrapone -mifepristone This medicine may also interact with the following medications: -aminoglutethimide -amphotericin B -aspirin and aspirin-like medicines -barbiturates -certain medicines for diabetes, like glipizide or  glyburide -cholestyramine -cholinesterase inhibitors -cyclosporine -digoxin -diuretics -ephedrine -female hormones, like estrogens and birth control pills -isoniazid -ketoconazole -NSAIDS, medicines for pain and inflammation, like ibuprofen or naproxen -phenytoin -rifampin -toxoids -vaccines -warfarin This list may not describe all possible interactions. Give your health care provider a list of all the medicines, herbs, non-prescription drugs, or dietary supplements you use. Also tell them if you smoke, drink alcohol, or use illegal drugs. Some items may interact with your medicine. What should I watch for while using this medicine? Visit your doctor or health care professional for regular checks on your progress. If you are taking this medicine over a prolonged period, carry an identification card with your name and address, the type and dose of your medicine, and your doctor's name and address. This medicine may increase your risk of getting an infection. Tell your doctor or health care professional if you are around anyone with measles or chickenpox, or if you develop sores or blisters that do not heal properly. If you are going to have surgery, tell your doctor or health care professional that you have taken this medicine within the last twelve months. Ask your doctor or health care professional about your diet. You may need to lower the amount of salt you eat. This medicine may affect blood sugar levels. If you have diabetes, check with your doctor or health care professional before  you change your diet or the dose of your diabetic medicine. What side effects may I notice from receiving this medicine? Side effects that you should report to your doctor or health care professional as soon as possible: -allergic reactions like skin rash, itching or hives, swelling of the face, lips, or tongue -changes in emotions or moods -changes in vision -depressed mood -eye pain -fever or chills,  cough, sore throat, pain or difficulty passing urine -increased thirst -swelling of ankles, feet Side effects that usually do not require medical attention (report to your doctor or health care professional if they continue or are bothersome): -confusion, excitement, restlessness -headache -nausea, vomiting -skin problems, acne, thin and shiny skin -trouble sleeping -weight gain This list may not describe all possible side effects. Call your doctor for medical advice about side effects. You may report side effects to FDA at 1-800-FDA-1088. Where should I keep my medicine? Keep out of the reach of children. Store at room temperature between 15 and 30 degrees C (59 and 86 degrees F). Protect from light. Keep container tightly closed. Throw away any unused medicine after the expiration date. NOTE: This sheet is a summary. It may not cover all possible information. If you have questions about this medicine, talk to your doctor, pharmacist, or health care provider.  2018 [employer] Standard (2011-02-04 10:57:14)

## 2016-09-09 ENCOUNTER — Telehealth: Payer: Self-pay | Admitting: Rheumatology

## 2016-09-09 LAB — IGG, IGA, IGM
IgA: 275 mg/dL (ref 81–463)
IgG (Immunoglobin G), Serum: 837 mg/dL (ref 694–1618)
IgM, Serum: 37 mg/dL — ABNORMAL LOW (ref 48–271)

## 2016-09-09 LAB — URINALYSIS, ROUTINE W REFLEX MICROSCOPIC
Bilirubin Urine: NEGATIVE
Glucose, UA: NEGATIVE
Hgb urine dipstick: NEGATIVE
Ketones, ur: NEGATIVE
Nitrite: NEGATIVE
Protein, ur: NEGATIVE
Specific Gravity, Urine: 1.017 (ref 1.001–1.035)
pH: 5.5 (ref 5.0–8.0)

## 2016-09-09 LAB — URINALYSIS, MICROSCOPIC ONLY
Bacteria, UA: NONE SEEN [HPF]
Casts: NONE SEEN [LPF]
Crystals: NONE SEEN [HPF]
RBC / HPF: NONE SEEN RBC/HPF (ref <=2)
Yeast: NONE SEEN [HPF]

## 2016-09-09 LAB — HEPATITIS A ANTIBODY, IGM: Hep A IgM: NONREACTIVE

## 2016-09-09 LAB — SEDIMENTATION RATE: Sed Rate: 32 mm/hr — ABNORMAL HIGH (ref 0–30)

## 2016-09-09 LAB — HEPATITIS B CORE ANTIBODY, IGM: Hep B C IgM: NONREACTIVE

## 2016-09-09 LAB — HEPATITIS B SURFACE ANTIGEN: Hepatitis B Surface Ag: NEGATIVE

## 2016-09-09 LAB — CYCLIC CITRUL PEPTIDE ANTIBODY, IGG: Cyclic Citrullin Peptide Ab: <16 Units

## 2016-09-09 LAB — ANGIOTENSIN CONVERTING ENZYME: Angiotensin-Converting Enzyme: 37 U/L (ref 9–67)

## 2016-09-09 LAB — HEPATITIS C ANTIBODY: HCV Ab: NEGATIVE

## 2016-09-09 NOTE — Telephone Encounter (Signed)
Patient was seen yesterday for a new patient appt and patient states that Dr. Estanislado Pandy asked if she had both pneumonia shots and patient says that she has. Just an fyi.

## 2016-09-10 LAB — PROTEIN ELECTROPHORESIS, SERUM, WITH REFLEX
Albumin ELP: 3.8 g/dL (ref 3.8–4.8)
Alpha-1-Globulin: 0.5 g/dL — ABNORMAL HIGH (ref 0.2–0.3)
Alpha-2-Globulin: 0.9 g/dL (ref 0.5–0.9)
Beta 2: 0.5 g/dL (ref 0.2–0.5)
Beta Globulin: 0.4 g/dL (ref 0.4–0.6)
Gamma Globulin: 0.8 g/dL (ref 0.8–1.7)
Total Protein, Serum Electrophoresis: 6.9 g/dL (ref 6.1–8.1)

## 2016-09-10 LAB — QUANTIFERON TB GOLD ASSAY (BLOOD)
Interferon Gamma Release Assay: NEGATIVE
Mitogen-Nil: 9.33 IU/mL
Quantiferon Nil Value: 0.02 IU/mL
Quantiferon Tb Ag Minus Nil Value: 0 IU/mL

## 2016-09-11 LAB — 14-3-3 ETA PROTEIN: 14-3-3 eta Protein: <0.2 ng/mL (ref <0.2)

## 2016-09-13 ENCOUNTER — Telehealth: Payer: Self-pay | Admitting: Radiology

## 2016-09-13 MED ORDER — FOLIC ACID 1 MG PO TABS: 1.0000 mg | ORAL_TABLET | Freq: Every day | ORAL | 4 refills | Status: DC

## 2016-09-13 MED ORDER — "TUBERCULIN SYRINGE 27G X 1/2"" 1 ML MISC": 3 refills | Status: DC

## 2016-09-13 MED ORDER — METHOTREXATE SODIUM CHEMO INJECTION 50 MG/2ML: 10.0000 mg | INTRAMUSCULAR | 2 refills | Status: DC

## 2016-09-13 NOTE — Telephone Encounter (Signed)
-----   Message from Bo Merino, MD sent at 09/13/2016  9:00 AM EDT ----- Ok to start MTx at 0.46ml sq qweek with Folic acid. We can increase to 0.6 ml sq qwk and monitor labs. She will need rx for needles and syringes.

## 2016-09-13 NOTE — Progress Notes (Signed)
Ok to start MTx at 0.98ml sq qweek with Folic acid. We can increase to 0.6 ml sq qwk and monitor labs. She will need rx for needles and syringes.

## 2016-09-13 NOTE — Telephone Encounter (Signed)
Sent in for her / with folic acid needles syringes.

## 2016-09-21 ENCOUNTER — Ambulatory Visit: Payer: Self-pay | Admitting: Rheumatology

## 2016-09-23 ENCOUNTER — Encounter: Payer: Self-pay | Admitting: Pharmacist

## 2016-09-23 NOTE — Progress Notes (Signed)
Received letter from patient's insurance regarding potential clinical concern with use of methotrexate and meloxicam.  Patient was initiated on methotrexate on 09/13/16.  At 09/08/16 visit she was counseled to avoid NSAIDs while on methotrexate.    Elisabeth Most, Pharm.D., BCPS, CPP Clinical Pharmacist Pager: [phone] Phone: [phone] 09/23/2016 9:11 AM

## 2016-09-30 NOTE — Progress Notes (Signed)
Office Visit Note  Patient: Annette Suarez             Date of Birth: 02/21/1941           MRN: [id number]             PCP: Shirline Frees, MD Referring: Shirline Frees, MD Visit Date: 10/08/2016 Occupation: _0 @    Subjective:  Pain hands.   History of Present Illness: Annette Suarez is a 76 y.o. female with history of rheumatoid arthritis. She started methotrexate 0.4 ML subcutaneously per week about a month ago. She has not had any lab work since then and could not increase the dose further she continues to have pain and discomfort and swelling in her bilateral hands and bilateral wrist joints. He has some discomfort in her C-spine.   Activities of Daily Living:  Patient reports morning stiffness for 2 hours.   Patient Reports nocturnal pain.  Difficulty dressing/grooming: Reports Difficulty climbing stairs: Denies Difficulty getting out of chair: Denies Difficulty using hands for taps, buttons, cutlery, and/or writing: Reports   Review of Systems  Constitutional: Positive for fatigue. Negative for night sweats, weight gain, weight loss and weakness.  HENT: Negative for mouth sores, trouble swallowing, trouble swallowing, mouth dryness and nose dryness.   Eyes: Negative for pain, redness, visual disturbance and dryness.  Respiratory: Negative for cough, shortness of breath and difficulty breathing.   Cardiovascular: Negative for chest pain, palpitations, hypertension, irregular heartbeat and swelling in legs/feet.  Gastrointestinal: Negative for blood in stool, constipation and diarrhea.  Endocrine: Negative for increased urination.  Genitourinary: Negative for vaginal dryness.  Musculoskeletal: Positive for arthralgias, joint pain, joint swelling and morning stiffness. Negative for myalgias, muscle weakness, muscle tenderness and myalgias.  Skin: Negative for color change, rash, hair loss, skin tightness, ulcers and sensitivity to sunlight.  Allergic/Immunologic:  Negative for susceptible to infections.  Neurological: Negative for dizziness, memory loss and night sweats.  Hematological: Negative for swollen glands.  Psychiatric/Behavioral: Positive for sleep disturbance. Negative for depressed mood. The patient is not nervous/anxious.     PMFS History:  Patient Active Problem List   Diagnosis Date Noted  . Rheumatoid arthritis of multiple sites with negative rheumatoid factor ([place]) 10/06/2016  . High risk medication use 10/06/2016  . Dyslipidemia 10/06/2016  . Pain in both hands 09/08/2016  . Essential hypertension 09/08/2016  . Type 2 diabetes mellitus  09/08/2016  . Gastroesophageal reflux disease without esophagitis 09/08/2016  . Osteopenia of multiple sites 09/08/2016  . History of anxiety 09/08/2016  . Benign paroxysmal positional vertigo 09/08/2016  . Smoker 09/08/2016  . Sleep apnea 09/08/2016    No past medical history on file.  No family history on file. No past surgical history on file. Social History   Social History Narrative  . No narrative on file     Objective: Vital Signs: BP 130/74   Pulse 82   Resp 14   Wt 150 lb (68 kg)   BMI 28.34 kg/m    Physical Exam  Constitutional: She is oriented to person, place, and time. She appears well-developed and well-nourished.  HENT:  Head: Normocephalic and atraumatic.  Eyes: Conjunctivae and EOM are normal.  Neck: Normal range of motion.  Cardiovascular: Normal rate, regular rhythm, normal heart sounds and intact distal pulses.   Pulmonary/Chest: Effort normal and breath sounds normal.  Abdominal: Soft. Bowel sounds are normal.  Lymphadenopathy:    She has no cervical adenopathy.  Neurological: She is alert and oriented  to person, place, and time.  Skin: Skin is warm and dry. Capillary refill takes less than 2 seconds.  Psychiatric: She has a normal mood and affect. Her behavior is normal.  Nursing note and vitals reviewed.    Musculoskeletal Exam: C-spine some  discomfort with range of motion thoracic and lumbar spine good range of motion she has some thoracic kyphosis. Shoulder joints elbow joints were good range of motion. She has some synovitis in bilateral wrist joints with some him tenosynovitis. Synovial thickening and mild synovitis over her MCP joints was noted. PIP/DIP joints are good range of motion. Hip joints knee joints ankles MTPs PIPs with good range of motion with no synovitis..  CDAI Exam: CDAI Homunculus Exam:   Tenderness:  RUE: wrist LUE: wrist  Swelling:  RUE: wrist LUE: wrist Right hand: 2nd MCP and 3rd MCP Left hand: 2nd MCP and 3rd MCP  Joint Counts:  CDAI Tender Joint count: 2 CDAI Swollen Joint count: 6  Global Assessments:  Patient Global Assessment: 8 Provider Global Assessment: 7  CDAI Calculated Score: 23    Investigation: No additional findings. Office Visit on 09/08/2016  Component Date Value Ref Range Status  . WBC 09/08/2016 12.0* 3.8 - 10.8 K/uL Final  . RBC 09/08/2016 3.78* 3.80 - 5.10 MIL/uL Final  . Hemoglobin 09/08/2016 11.4* 11.7 - 15.5 g/dL Final  . HCT 09/08/2016 33.5* 35.0 - 45.0 % Final  . MCV 09/08/2016 88.6  80.0 - 100.0 fL Final  . MCH 09/08/2016 30.2  27.0 - 33.0 pg Final  . MCHC 09/08/2016 34.0  32.0 - 36.0 g/dL Final  . RDW 09/08/2016 13.4  11.0 - 15.0 % Final  . Platelets 09/08/2016 426* 140 - 400 K/uL Final  . MPV 09/08/2016 9.7  7.5 - 12.5 fL Final  . Neutro Abs 09/08/2016 9360* 1,500 - 7,800 cells/uL Final  . Lymphs Abs 09/08/2016 1800  850 - 3,900 cells/uL Final  . Monocytes Absolute 09/08/2016 600  200 - 950 cells/uL Final  . Eosinophils Absolute 09/08/2016 240  15 - 500 cells/uL Final  . Basophils Absolute 09/08/2016 0  0 - 200 cells/uL Final  . Neutrophils Relative % 09/08/2016 78  % Final  . Lymphocytes Relative 09/08/2016 15  % Final  . Monocytes Relative 09/08/2016 5  % Final  . Eosinophils Relative 09/08/2016 2  % Final  . Basophils Relative 09/08/2016 0  % Final   . Smear Review 09/08/2016 Criteria for review not met   Final  . Sodium 09/08/2016 140  135 - 146 mmol/L Final  . Potassium 09/08/2016 4.4  3.5 - 5.3 mmol/L Final  . Chloride 09/08/2016 100  98 - 110 mmol/L Final  . CO2 09/08/2016 23  20 - 31 mmol/L Final  . Glucose, Bld 09/08/2016 132* 65 - 99 mg/dL Final  . BUN 09/08/2016 23  7 - 25 mg/dL Final  . Creat 09/08/2016 0.82  0.60 - 0.93 mg/dL Final   Comment:   For patients > or = 74 years of age: The upper reference limit for Creatinine is approximately 13% higher for people identified as African-American.     . Total Bilirubin 09/08/2016 0.8  0.2 - 1.2 mg/dL Final  . Alkaline Phosphatase 09/08/2016 84  33 - 130 U/L Final  . AST 09/08/2016 9* 10 - 35 U/L Final  . ALT 09/08/2016 7  6 - 29 U/L Final  . Total Protein 09/08/2016 6.9  6.1 - 8.1 g/dL Final  . Albumin 09/08/2016 3.9  3.6 -  5.1 g/dL Final  . Calcium 09/08/2016 9.2  8.6 - 10.4 mg/dL Final  . GFR, Est African American 09/08/2016 82  >=60 mL/min Final  . GFR, Est Non African American 09/08/2016 71  >=60 mL/min Final  . Sed Rate 09/08/2016 32* 0 - 30 mm/hr Final  . Cyclic Citrullin Peptide Ab 09/08/2016 <16  Units Final   Comment:   Reference Range Negative               < 20 Weak Positive            20 - 39 Moderate Positive        40 - 59 Strong Positive        > 59   . 14-3-3 eta Protein 09/08/2016 <0.2  <0.2 ng/mL Final   Comment:   This test was developed and its analytical performance characteristics have been determined by [facility]. It has not been cleared or approved by FDA. This assay has been validated pursuant to the CLIA regulations and is used for clinical purposes.   . Angiotensin-Converting Enzyme 09/08/2016 37  9 - 67 U/L Final   Comment: ** Please note change in reference range(s). **     . HCV Ab 09/08/2016 NEGATIVE  NEGATIVE Final  . Hepatitis B Surface Ag 09/08/2016 NEGATIVE  NEGATIVE Final  . Hep B  C IgM 09/08/2016 NON REACTIVE  NON REACTIVE Final   Comment: High levels of Hepatitis B Core IgM antibody are detectable during the acute stage of Hepatitis B. This antibody is used to differentiate current from past HBV infection.     . Hep A IgM 09/08/2016 NON REACTIVE  NON REACTIVE Final  . Total Protein, Serum Electrophores* 09/08/2016 6.9  6.1 - 8.1 g/dL Final  . Albumin ELP 09/08/2016 3.8  3.8 - 4.8 g/dL Final  . Alpha-1-Globulin 09/08/2016 0.5* 0.2 - 0.3 g/dL Final  . Alpha-2-Globulin 09/08/2016 0.9  0.5 - 0.9 g/dL Final  . Beta Globulin 09/08/2016 0.4  0.4 - 0.6 g/dL Final  . Beta 2 09/08/2016 0.5  0.2 - 0.5 g/dL Final  . Gamma Globulin 09/08/2016 0.8  0.8 - 1.7 g/dL Final  . Abnormal Protein Band1 09/08/2016 NOT DET  g/dL Final  . SPE Interp. 09/08/2016 SEE NOTE   Final   Comment: One or more serum protein fractions are outside the normal ranges. No abnormal protein bands are apparent. Reviewed by Odis Hollingshead, MD, PhD, FCAP (Electronic Signature on File)   . Abnormal Protein Band2 09/08/2016 NOT DET  g/dL Final  . Abnormal Protein Band3 09/08/2016 NOT DET  g/dL Final  . IgG (Immunoglobin G), Serum 09/08/2016 837  694 - 1,618 mg/dL Final  . IgA 09/08/2016 275  81 - 463 mg/dL Final  . IgM, Serum 09/08/2016 37* 48 - 271 mg/dL Final  . Color, Urine 09/08/2016 YELLOW  YELLOW Final  . APPearance 09/08/2016 CLEAR  CLEAR Final  . Specific Gravity, Urine 09/08/2016 1.017  1.001 - 1.035 Final  . pH 09/08/2016 5.5  5.0 - 8.0 Final  . Glucose, UA 09/08/2016 NEGATIVE  NEGATIVE Final  . Bilirubin Urine 09/08/2016 NEGATIVE  NEGATIVE Final  . Ketones, ur 09/08/2016 NEGATIVE  NEGATIVE Final  . Hgb urine dipstick 09/08/2016 NEGATIVE  NEGATIVE Final  . Protein, ur 09/08/2016 NEGATIVE  NEGATIVE Final  . Nitrite 09/08/2016 NEGATIVE  NEGATIVE Final  . Leukocytes, UA 09/08/2016 2+* NEGATIVE Final  . Interferon Gamma Release Assay 09/08/2016 NEGATIVE  NEGATIVE Final  .  Quantiferon Nil  Value 09/08/2016 0.02  IU/mL Final  . Mitogen-Nil 09/08/2016 9.33  IU/mL Final  . Quantiferon Tb Ag Minus Nil Value 09/08/2016 0.00  IU/mL Final   Comment:   The Nil tube value is used to determine if the patient has a preexisting immune response which could cause a false-positive reading on the test. In order for a test to be valid, the Nil tube must have a value of less than or equal to 8.0 IU/mL.   The mitogen control tube is used to assure the patient has a healthy immune status and also serves as a control for correct blood handling and incubation. It is used to detect false-negative readings. The mitogen tube must have a gamma interferon value of greater than or equal to 0.5 IU/mL higher than the value of the Nil tube.   The TB antigen tube is coated with the M. tuberculosis specific antigens. For a test to be considered positive, the TB antigen tube value minus the Nil tube value must be greater than or equal to 0.35 IU/mL.   For additional information, please refer to http://education.questdiagnostics.com/faq/QFT (This link is being provided for informational/educational purposes only.)   . WBC, UA 09/08/2016 6-10* <=5 WBC/HPF Final  . RBC / HPF 09/08/2016 NONE SEEN  <=2 RBC/HPF Final  . Squamous Epithelial / LPF 09/08/2016 6-10* <=5 HPF Final  . Bacteria, UA 09/08/2016 NONE SEEN  NONE SEEN HPF Final  . Crystals 09/08/2016 NONE SEEN  NONE SEEN HPF Final  . Casts 09/08/2016 NONE SEEN  NONE SEEN LPF Final  . Yeast 09/08/2016 NONE SEEN  NONE SEEN HPF Final     Imaging: No results found.  Speciality Comments: No specialty comments available.    Procedures:  Medium Joint Inj Date/Time: 10/08/2016 10:11 AM Performed by: Bo Merino Authorized by: Bo Merino   Consent Given by:  Patient Site marked: the procedure site was marked   Timeout: prior to procedure the correct patient, procedure, and site was verified   Indications:  Pain and joint  swelling Location:  Wrist Site:  L intercarpal Prep: patient was prepped and draped in usual sterile fashion   Needle Size:  27 G Needle Length:  1.5 inches Approach:  Dorsal Ultrasound Guided: No   Fluoroscopic Guidance: No   Medications:  1 mL lidocaine 1 %; 20 mg triamcinolone acetonide 40 MG/ML Aspiration Attempted: Yes   Aspirate amount (mL):  0 Patient tolerance:  Patient tolerated the procedure well with no immediate complications   Allergies: Latex   Assessment / Plan:     Visit Diagnoses: Rheumatoid arthritis of multiple sites with negative rheumatoid factor ([place]) - MRI positive for synovitis and erosive changes. She has noticed some improvement on methotrexate but she is still on very low-dose at 0.4 ML subcutaneously. She has not had follow-up labs. We will check those labs today. She has not of pain and swelling in her bilateral wrist joints especially her left wrist joint. After informed consent was obtained left wrist joint was prepped and strong fashion injected with cortisone the procedures described above.  Pain in both hands: She continues to have some pain and swelling in her bilateral hands and wrists joints.  High risk medication use - Methotrexate 0.4 ML subcutaneous per week, folic acid 1 mg by mouth daily. Plan is to increase her methotrexate to 0.6 ML subcutaneous per week if her labs are stable we'll increase it to 0.8 ML subcutaneous per week. If she cannot tolerate higher dose  of methotrexate Plaquenil could be an option. Indications side effects contraindications of Plaquenil were also discussed at length today in case we have to at that in future.  Smoker  Osteopenia of multiple sites  Essential hypertension  Type 2 diabetes mellitus   Gastroesophageal reflux disease without esophagitis  History of anxiety  Sleep apnea, unspecified type  Dyslipidemia     Orders: Orders Placed This Encounter  Procedures  . Medium Joint Injection/Arthrocentesis    Meds ordered this encounter  Medications  . methotrexate 50 MG/2ML injection    Sig: Inject 0.6 mLs (15 mg total) into the skin once a week for two weeks then inject 0.8 mL (20 mg total) into the skin once a week.    Dispense:  4 mL    Refill:  0    Use 2 mL vials with preservative  . folic acid (FOLVITE) 1 MG tablet    Sig: Take 2 tablets (2 mg total) by mouth daily.    Dispense:  180 tablet    Refill:  4    Face-to-face time spent with patient was 30 minutes. 50% of time was spent in counseling and coordination of care.  Follow-Up Instructions: Return in about 2 months (around 12/08/2016) for Rheumatoid arthritis.   Bo Merino, MD  Note - This record has been created using Editor, commissioning.  Chart creation errors have been sought, but may not always  have been located. Such creation errors do not reflect on  the standard of medical care.

## 2016-10-06 DIAGNOSIS — M0609 Rheumatoid arthritis without rheumatoid factor, multiple sites: Secondary | ICD-10-CM | POA: Insufficient documentation

## 2016-10-06 DIAGNOSIS — E785 Hyperlipidemia, unspecified: Secondary | ICD-10-CM | POA: Insufficient documentation

## 2016-10-06 DIAGNOSIS — Z79899 Other long term (current) drug therapy: Secondary | ICD-10-CM | POA: Insufficient documentation

## 2016-10-08 ENCOUNTER — Ambulatory Visit (INDEPENDENT_AMBULATORY_CARE_PROVIDER_SITE_OTHER): Payer: Medicare Other | Admitting: Rheumatology

## 2016-10-08 ENCOUNTER — Other Ambulatory Visit: Payer: Self-pay | Admitting: Radiology

## 2016-10-08 ENCOUNTER — Encounter: Payer: Self-pay | Admitting: Rheumatology

## 2016-10-08 VITALS — BP 130/74 | HR 82 | Resp 14 | Wt 150.0 lb

## 2016-10-08 DIAGNOSIS — M25532 Pain in left wrist: Secondary | ICD-10-CM

## 2016-10-08 DIAGNOSIS — M0609 Rheumatoid arthritis without rheumatoid factor, multiple sites: Secondary | ICD-10-CM | POA: Diagnosis not present

## 2016-10-08 DIAGNOSIS — E785 Hyperlipidemia, unspecified: Secondary | ICD-10-CM | POA: Diagnosis not present

## 2016-10-08 DIAGNOSIS — Z8659 Personal history of other mental and behavioral disorders: Secondary | ICD-10-CM

## 2016-10-08 DIAGNOSIS — Z794 Long term (current) use of insulin: Secondary | ICD-10-CM

## 2016-10-08 DIAGNOSIS — I10 Essential (primary) hypertension: Secondary | ICD-10-CM | POA: Diagnosis not present

## 2016-10-08 DIAGNOSIS — M25432 Effusion, left wrist: Secondary | ICD-10-CM

## 2016-10-08 DIAGNOSIS — K219 Gastro-esophageal reflux disease without esophagitis: Secondary | ICD-10-CM

## 2016-10-08 DIAGNOSIS — Z79899 Other long term (current) drug therapy: Secondary | ICD-10-CM

## 2016-10-08 DIAGNOSIS — E118 Type 2 diabetes mellitus with unspecified complications: Secondary | ICD-10-CM

## 2016-10-08 DIAGNOSIS — M79641 Pain in right hand: Secondary | ICD-10-CM | POA: Diagnosis not present

## 2016-10-08 DIAGNOSIS — M8589 Other specified disorders of bone density and structure, multiple sites: Secondary | ICD-10-CM

## 2016-10-08 DIAGNOSIS — F17200 Nicotine dependence, unspecified, uncomplicated: Secondary | ICD-10-CM | POA: Diagnosis not present

## 2016-10-08 DIAGNOSIS — G4730 Sleep apnea, unspecified: Secondary | ICD-10-CM

## 2016-10-08 DIAGNOSIS — M79642 Pain in left hand: Secondary | ICD-10-CM

## 2016-10-08 LAB — CBC WITH DIFFERENTIAL/PLATELET
Basophils Absolute: 0 cells/uL (ref 0–200)
Basophils Relative: 0 %
Eosinophils Absolute: 169 cells/uL (ref 15–500)
Eosinophils Relative: 1 %
HCT: 36.6 % (ref 35.0–45.0)
Hemoglobin: 12.3 g/dL (ref 11.7–15.5)
Lymphocytes Relative: 10 %
Lymphs Abs: 1690 cells/uL (ref 850–3900)
MCH: 30.4 pg (ref 27.0–33.0)
MCHC: 33.6 g/dL (ref 32.0–36.0)
MCV: 90.4 fL (ref 80.0–100.0)
MPV: 9.5 fL (ref 7.5–12.5)
Monocytes Absolute: 507 cells/uL (ref 200–950)
Monocytes Relative: 3 %
Neutro Abs: 14534 cells/uL — ABNORMAL HIGH (ref 1500–7800)
Neutrophils Relative %: 86 %
Platelets: 361 10*3/uL (ref 140–400)
RBC: 4.05 MIL/uL (ref 3.80–5.10)
RDW: 14.5 % (ref 11.0–15.0)
WBC: 16.9 10*3/uL — ABNORMAL HIGH (ref 3.8–10.8)

## 2016-10-08 LAB — COMPLETE METABOLIC PANEL WITH GFR
ALT: 20 U/L (ref 6–29)
AST: 16 U/L (ref 10–35)
Albumin: 4.2 g/dL (ref 3.6–5.1)
Alkaline Phosphatase: 87 U/L (ref 33–130)
BUN: 18 mg/dL (ref 7–25)
CO2: 26 mmol/L (ref 20–31)
Calcium: 9.1 mg/dL (ref 8.6–10.4)
Chloride: 100 mmol/L (ref 98–110)
Creat: 0.85 mg/dL (ref 0.60–0.93)
GFR, Est African American: 79 mL/min (ref >=60)
GFR, Est Non African American: 68 mL/min (ref >=60)
Glucose, Bld: 162 mg/dL — ABNORMAL HIGH (ref 65–99)
Potassium: 4.5 mmol/L (ref 3.5–5.3)
Sodium: 138 mmol/L (ref 135–146)
Total Bilirubin: 0.9 mg/dL (ref 0.2–1.2)
Total Protein: 6.8 g/dL (ref 6.1–8.1)

## 2016-10-08 MED ORDER — FOLIC ACID 1 MG PO TABS
2.0000 mg | ORAL_TABLET | Freq: Every day | ORAL | 4 refills | Status: DC
Start: 2016-10-08 — End: 2017-04-13

## 2016-10-08 MED ORDER — LIDOCAINE HCL 1 % IJ SOLN
1.0000 mL | INTRAMUSCULAR | Status: AC | PRN
  Administered 2016-10-08: 1 mL

## 2016-10-08 MED ORDER — TRIAMCINOLONE ACETONIDE 40 MG/ML IJ SUSP
20.0000 mg | INTRAMUSCULAR | Status: AC | PRN
  Administered 2016-10-08: 20 mg via INTRA_ARTICULAR

## 2016-10-08 MED ORDER — METHOTREXATE SODIUM CHEMO INJECTION 50 MG/2ML
INTRAMUSCULAR | 0 refills | Status: DC
Start: 2016-10-08 — End: 2016-11-09

## 2016-10-08 NOTE — Patient Instructions (Addendum)
Continue prednisone 5 mg (1 tablet) daily for one week then decrease to 2.5 mg (1/2 tablet) daily for one week  Increase folic acid to 2 mg (2 tablets) daily  We will increase methotrexate to 0.6 mL weekly next week as long as your labs are stable.  Get labs two weeks after starting 0.6 mL weekly.  If those labs are stable, we can increase dose again to 0.8 mL weekly.  Get labs two weeks after increasing to 0.8 mL dose.   Standing Labs We placed an order today for your standing lab work.    Please come back and get your standing labs in 2 weeks x 2, then every 2 months.    We have open lab Monday through Friday from 8:30-11:30 AM and 1:30-4 PM at the office of Dr. Tresa Moore, PA.   The office is located at [address], [place], [place], [place] [address] No appointment is necessary.   Labs are drawn by [facility].  You may receive a bill from [facility] for your lab work.     Hydroxychloroquine tablets What is this medicine? HYDROXYCHLOROQUINE (hye drox ee KLOR oh kwin) is used to treat rheumatoid arthritis and systemic lupus erythematosus. It is also used to treat malaria. This medicine may be used for other purposes; ask your health care provider or pharmacist if you have questions. COMMON BRAND NAME(S): Plaquenil, Quineprox What should I tell my health care provider before I take this medicine? They need to know if you have any of these conditions: -diabetes -eye disease, vision problems -G6PD deficiency -history of blood diseases -history of irregular heartbeat -if you often drink alcohol -kidney disease -liver disease -porphyria -psoriasis -seizures -an unusual or allergic reaction to chloroquine, hydroxychloroquine, other medicines, foods, dyes, or preservatives -pregnant or trying to get pregnant -breast-feeding How should I use this medicine? Take this medicine by mouth with a glass of water. Follow the directions on the prescription label. Avoid  taking antacids within 4 hours of taking this medicine. It is best to separate these medicines by at least 4 hours. Do not cut, crush or chew this medicine. You can take it with or without food. If it upsets your stomach, take it with food. Take your medicine at regular intervals. Do not take your medicine more often than directed. Take all of your medicine as directed even if you think you are better. Do not skip doses or stop your medicine early. Talk to your pediatrician regarding the use of this medicine in children. While this drug may be prescribed for selected conditions, precautions do apply. Overdosage: If you think you have taken too much of this medicine contact a poison control center or emergency room at once. NOTE: This medicine is only for you. Do not share this medicine with others. What if I miss a dose? If you miss a dose, take it as soon as you can. If it is almost time for your next dose, take only that dose. Do not take double or extra doses. What may interact with this medicine? Do not take this medicine with any of the following medications: -cisapride -dofetilide -dronedarone -live virus vaccines -penicillamine -pimozide -thioridazine -ziprasidone This medicine may also interact with the following medications: -ampicillin -antacids -cimetidine -cyclosporine -digoxin -medicines for diabetes, like insulin, glipizide, glyburide -medicines for seizures like carbamazepine, phenobarbital, phenytoin -mefloquine -methotrexate -other medicines that prolong the QT interval (cause an abnormal heart rhythm) -praziquantel This list may not describe all possible interactions. Give your health  care provider a list of all the medicines, herbs, non-prescription drugs, or dietary supplements you use. Also tell them if you smoke, drink alcohol, or use illegal drugs. Some items may interact with your medicine. What should I watch for while using this medicine? Tell your doctor or  healthcare professional if your symptoms do not start to get better or if they get worse. Avoid taking antacids within 4 hours of taking this medicine. It is best to separate these medicines by at least 4 hours. Tell your doctor or health care professional right away if you have any change in your eyesight. Your vision and blood may be tested before and during use of this medicine. This medicine can make you more sensitive to the sun. Keep out of the sun. If you cannot avoid being in the sun, wear protective clothing and use sunscreen. Do not use sun lamps or tanning beds/booths. What side effects may I notice from receiving this medicine? Side effects that you should report to your doctor or health care professional as soon as possible: -allergic reactions like skin rash, itching or hives, swelling of the face, lips, or tongue -changes in vision -decreased hearing or ringing of the ears -redness, blistering, peeling or loosening of the skin, including inside the mouth -seizures -sensitivity to light -signs and symptoms of a dangerous change in heartbeat or heart rhythm like chest pain; dizziness; fast or irregular heartbeat; palpitations; feeling faint or lightheaded, falls; breathing problems -signs and symptoms of liver injury like dark yellow or brown urine; general ill feeling or flu-like symptoms; light-colored stools; loss of appetite; nausea; right upper belly pain; unusually weak or tired; yellowing of the eyes or skin -signs and symptoms of low blood sugar such as feeling anxious; confusion; dizziness; increased hunger; unusually weak or tired; sweating; shakiness; cold; irritable; headache; blurred vision; fast heartbeat; loss of consciousness -uncontrollable head, mouth, neck, arm, or leg movements Side effects that usually do not require medical attention (report to your doctor or health care professional if they continue or are bothersome): -anxious -diarrhea -dizziness -hair  loss -headache -irritable -loss of appetite -nausea, vomiting -stomach pain This list may not describe all possible side effects. Call your doctor for medical advice about side effects. You may report side effects to FDA at 1-800-FDA-1088. Where should I keep my medicine? Keep out of the reach of children. In children, this medicine can cause overdose with small doses. Store at room temperature between 15 and 30 degrees C (59 and 86 degrees F). Protect from moisture and light. Throw away any unused medicine after the expiration date. NOTE: This sheet is a summary. It may not cover all possible information. If you have questions about this medicine, talk to your doctor, pharmacist, or health care provider.  2018 Elsevier/Gold Standard (2016-02-04 14:16:15)

## 2016-10-08 NOTE — Progress Notes (Signed)
Pharmacy Note  Subjective: Patient presents today to the [facility] to see Dr. Estanislado Pandy.  Annette Suarez is a pleasant 76 yo F who presents for follow up of rheumatoid arthritis.  She is currently taking prednisone 5 mg daily and methotrexate 0.4 mL weekly and folic acid 1 mg daily.    Does the patient feel that his/her medications are working for him/her?  Patient started methotrexate 0.4 mL weekly four weeks ago.  She reports some improvement in her right hand, but continues to have joint pain and swelling.   Has the patient been experiencing any side effects to the medications prescribed?  No Does the patient have any problems obtaining medications?  No  Patient seen by the pharmacist for counseling on hydroxychloroquine.    Objective: CMP Latest Ref Rng & Units 09/08/2016  Glucose 65 - 99 mg/dL 132(H)  BUN 7 - 25 mg/dL 23  Creatinine 0.60 - 0.93 mg/dL 0.82  Sodium 135 - 146 mmol/L 140  Potassium 3.5 - 5.3 mmol/L 4.4  Chloride 98 - 110 mmol/L 100  CO2 20 - 31 mmol/L 23  Calcium 8.6 - 10.4 mg/dL 9.2  Total Protein 6.1 - 8.1 g/dL 6.9  Total Bilirubin 0.2 - 1.2 mg/dL 0.8  Alkaline Phos 33 - 130 U/L 84  AST 10 - 35 U/L 9(L)  ALT 6 - 29 U/L 7    CBC    Component Value Date/Time   WBC 12.0 (H) 09/08/2016 0912   RBC 3.78 (L) 09/08/2016 0912   HGB 11.4 (L) 09/08/2016 0912   HCT 33.5 (L) 09/08/2016 0912   PLT 426 (H) 09/08/2016 0912   MCV 88.6 09/08/2016 0912   MCH 30.2 09/08/2016 0912   MCHC 34.0 09/08/2016 0912   RDW 13.4 09/08/2016 0912   LYMPHSABS 1,800 09/08/2016 0912   MONOABS 600 09/08/2016 0912   EOSABS 240 09/08/2016 0912   BASOSABS 0 09/08/2016 0912    Assessment/Plan: She started methotrexate 0.4 mL weekly on 09/13/16.  Patient reports she is tolerating the medication well.  She is past due for standing labs today.  Plan is to increase dose to 0.6 mL weekly next week if labs are stable then increase to 0.8 mL two weeks later.  Patient was given standing lab  instructions for labs every 2 weeks x 2.  Patient also counseled to increase folic acid dose to 2 mg daily.    If patient is unable to increase methotrexate dose, will consider addition of hydroxychloroquine.  Patient was counseled on the purpose, proper use, and adverse effects of hydroxychloroquine including nausea/diarrhea, skin rash, headaches, and sun sensitivity.  Discussed importance of annual eye exams while on hydroxychloroquine to monitor to ocular toxicity and discussed importance of frequent laboratory monitoring.  Provided patient with eye exam form for baseline ophthalmologic exam.  Provided patient with educational materials on hydroxychloroquine and answered all questions.  Patient consented to hydroxychloroquine.  Will upload consent in the media tab.    Prednisone taper discussed with Dr. Estanislado Pandy.  Patient to continue prednisone 5 mg daily for one week then decrease dose to 2.5 mg daily for one week.  Patient was given prednisone taper instructions.    Elisabeth Most, Pharm.D., BCPS Clinical Pharmacist Pager: [phone] Phone: [phone] 10/08/2016 10:14 AM

## 2016-10-10 NOTE — Progress Notes (Signed)
Labs are stable. Glucose is elevated. She should monitor her glucose closely and follow up with PCP. She should be able to increase her methotrexate 0.6 mL subcutaneous every week. Her repeat labs should be in 2 weeks after increased dose.

## 2016-10-20 ENCOUNTER — Ambulatory Visit: Payer: Self-pay | Admitting: Rheumatology

## 2016-10-25 ENCOUNTER — Other Ambulatory Visit: Payer: Self-pay | Admitting: Radiology

## 2016-10-25 ENCOUNTER — Telehealth: Payer: Self-pay | Admitting: Radiology

## 2016-10-25 DIAGNOSIS — R200 Anesthesia of skin: Secondary | ICD-10-CM

## 2016-10-25 DIAGNOSIS — R202 Paresthesia of skin: Principal | ICD-10-CM

## 2016-10-25 DIAGNOSIS — Z79899 Other long term (current) drug therapy: Secondary | ICD-10-CM

## 2016-10-25 LAB — CBC WITH DIFFERENTIAL/PLATELET
Basophils Absolute: 0 cells/uL (ref 0–200)
Basophils Relative: 0 %
Eosinophils Absolute: 194 cells/uL (ref 15–500)
Eosinophils Relative: 2 %
HCT: 35 % (ref 35.0–45.0)
Hemoglobin: 11.6 g/dL — ABNORMAL LOW (ref 11.7–15.5)
Lymphocytes Relative: 18 %
Lymphs Abs: 1746 cells/uL (ref 850–3900)
MCH: 30.8 pg (ref 27.0–33.0)
MCHC: 33.1 g/dL (ref 32.0–36.0)
MCV: 92.8 fL (ref 80.0–100.0)
MPV: 9.5 fL (ref 7.5–12.5)
Monocytes Absolute: 582 cells/uL (ref 200–950)
Monocytes Relative: 6 %
Neutro Abs: 7178 cells/uL (ref 1500–7800)
Neutrophils Relative %: 74 %
Platelets: 314 10*3/uL (ref 140–400)
RBC: 3.77 MIL/uL — ABNORMAL LOW (ref 3.80–5.10)
RDW: 15.5 % — ABNORMAL HIGH (ref 11.0–15.0)
WBC: 9.7 10*3/uL (ref 3.8–10.8)

## 2016-10-25 LAB — COMPLETE METABOLIC PANEL WITH GFR
ALT: 11 U/L (ref 6–29)
AST: 11 U/L (ref 10–35)
Albumin: 4 g/dL (ref 3.6–5.1)
Alkaline Phosphatase: 78 U/L (ref 33–130)
BUN: 20 mg/dL (ref 7–25)
CO2: 23 mmol/L (ref 20–31)
Calcium: 8.9 mg/dL (ref 8.6–10.4)
Chloride: 104 mmol/L (ref 98–110)
Creat: 0.89 mg/dL (ref 0.60–0.93)
GFR, Est African American: 74 mL/min (ref >=60)
GFR, Est Non African American: 65 mL/min (ref >=60)
Glucose, Bld: 215 mg/dL — ABNORMAL HIGH (ref 65–99)
Potassium: 4.2 mmol/L (ref 3.5–5.3)
Sodium: 140 mmol/L (ref 135–146)
Total Bilirubin: 0.9 mg/dL (ref 0.2–1.2)
Total Protein: 6.3 g/dL (ref 6.1–8.1)

## 2016-10-25 NOTE — Telephone Encounter (Signed)
Patient stopped by for labs today and states her left hand fingers 1/2/3 are numb she feels like this is worse since her injection in her carpal tunnel. The injection was done on 10/08/16. I told her I would ask you about the increased numbness and call her back. Please advise.

## 2016-10-25 NOTE — Telephone Encounter (Signed)
I tried calling patient but could not reach her. We can do a prednisone taper with prednisone 20 mg for 2 days 15 for 2 days 10 for 2 days and 5 for 2 days to decrease pain and inflammation in her carpal tunnel. If her symptoms persist we can schedule nerve conduction velocities.

## 2016-10-26 ENCOUNTER — Telehealth: Payer: Self-pay | Admitting: Radiology

## 2016-10-26 NOTE — Telephone Encounter (Signed)
-----   Message from Bo Merino, MD sent at 10/26/2016 12:25 PM EDT ----- Glu elevated, anemia. Fax to PCP and notify Pt.

## 2016-10-26 NOTE — Progress Notes (Signed)
Glu elevated, anemia. Fax to PCP and notify Pt.

## 2016-10-26 NOTE — Telephone Encounter (Signed)
Patient has just completed a prednisone taper / I have called to advise of her labs.

## 2016-10-26 NOTE — Telephone Encounter (Signed)
She has declined the prednisone taper she has diabetes and has just finished prednisone you previously gave her.  She did want to proceed with the nerve study, I have put in the order  To you FYI

## 2016-11-05 ENCOUNTER — Encounter (INDEPENDENT_AMBULATORY_CARE_PROVIDER_SITE_OTHER): Payer: Self-pay | Admitting: Physical Medicine and Rehabilitation

## 2016-11-05 ENCOUNTER — Ambulatory Visit (INDEPENDENT_AMBULATORY_CARE_PROVIDER_SITE_OTHER): Payer: Medicare Other | Admitting: Physical Medicine and Rehabilitation

## 2016-11-05 DIAGNOSIS — R202 Paresthesia of skin: Secondary | ICD-10-CM | POA: Diagnosis not present

## 2016-11-05 NOTE — Progress Notes (Deleted)
Left wrist pain a few weeks ago. Had an injection, but fingers began to swell and she has difficulty bending them. Does have some numbness more in the first through third fingers with some in the fourth. Has sharp pains in left wrist which sometimes goes up to elbow. Difficulty sleeping. Right hand dominant.

## 2016-11-05 NOTE — Progress Notes (Signed)
GRETTELL Suarez - 76 y.o. female MRN [id number]  Date of birth: 1941/01/21  Office Visit Note: Visit Date: 11/05/2016 PCP: Shirline Frees, MD Referred by: Shirline Frees, MD  Subjective: Chief Complaint  Patient presents with  . Left Wrist - Pain  . Left Hand - Numbness   HPI: Mrs. Annette Suarez is a 76 year old right-hand dominant female followed by Dr. Patrecia Pour for her rheumatoid arthritis. She reports having had an injection into the dorsum of the hand several weeks ago and ever since then she feels like she is having increased numbness and tingling in the first 3 digits and half of the fourth digit. These are all radial digits. She denies any symptoms into the fifth digit. She said the injection was because of some left wrist pain that she was having. She does get some sharp pains in the left wrist and sometimes refer up to the elbow. She's had no pain radiating down the arm. She has difficulty sleeping because the symptoms do worsen at night. They seem to worsen with position. She does endorse some tingling numbness type symptoms off and on prior to the injection. She also endorses some right-sided complaints but milder. She's had no prior electrodiagnostic studies.    ROS Otherwise per HPI.  Assessment & Plan: Visit Diagnoses:  1. Paresthesia of skin     Plan: No additional findings.  Impression: The above electrodiagnostic study is ABNORMAL and reveals evidence of a severe left median nerve entrapment at the wrist (carpal tunnel syndrome) affecting sensory and motor components. The lesion is characterized by sensory and motor demyelination with evidence of axonal injury.   Recommendations: 1.  Follow-up with referring physician. 2.  Continue current management of symptoms. 3.  Suggest surgical evaluation.    Meds & Orders: No orders of the defined types were placed in this encounter.   Orders Placed This Encounter  Procedures  . NCV with EMG (electromyography)    Follow-up:  Return for Dr. Estanislado Pandy to call her with surgical reccomendation..   Procedures: No procedures performed  EMG & NCV Findings: Evaluation of the left median motor nerve showed prolonged distal onset latency (4.7 ms), reduced amplitude (0.4 mV), and decreased conduction velocity (Elbow-Wrist, 33 m/s).  The left median (across palm) sensory nerve showed no response (Wrist) and no response (Palm).  All remaining nerves (as indicated in the following tables) were within normal limits.    Needle evaluation of the left abductor pollicis brevis muscle showed increased insertional activity, widespread spontaneous activity, increased motor unit amplitude, and diminished recruitment.  All remaining muscles (as indicated in the following table) showed no evidence of electrical instability.    Impression: The above electrodiagnostic study is ABNORMAL and reveals evidence of a severe left median nerve entrapment at the wrist (carpal tunnel syndrome) affecting sensory and motor components. The lesion is characterized by sensory and motor demyelination with evidence of axonal injury.   Recommendations: 1.  Follow-up with referring physician. 2.  Continue current management of symptoms. 3.  Suggest surgical evaluation.    Nerve Conduction Studies Anti Sensory Summary Table   Stim Site NR Peak (ms) Norm Peak (ms) P-T Amp (V) Norm P-T Amp Site1 Site2 Delta-P (ms) Dist (cm) Vel (m/s) Norm Vel (m/s)  Left Median Acr Palm Anti Sensory (2nd Digit)  33.3C  Wrist *NR  <3.6  >10 Wrist Palm  0.0    Palm *NR  <2.0          Left Radial Anti Sensory (Base 1st  Digit)  32.8C  Wrist    1.8 <3.1 22.0  Wrist Base 1st Digit 1.8 0.0    Left Ulnar Anti Sensory (5th Digit)  33.4C  Wrist    3.2 <3.7 21.7 >15.0 Wrist 5th Digit 3.2 14.0 44 >38   Motor Summary Table   Stim Site NR Onset (ms) Norm Onset (ms) O-P Amp (mV) Norm O-P Amp Site1 Site2 Delta-0 (ms) Dist (cm) Vel (m/s) Norm Vel (m/s)  Left Median Motor (Abd Poll  Brev)  33.2C  Wrist    *4.7 <4.2 *0.4 >5 Elbow Wrist 6.0 20.0 *33 >50  Elbow    10.7  0.8         Left Ulnar Motor (Abd Dig Min)  33.4C  Wrist    2.6 <4.2 6.5 >3 B Elbow Wrist 2.9 18.0 62 >53  B Elbow    5.5  6.1  A Elbow B Elbow 1.5 9.5 63 >53  A Elbow    7.0  5.7          EMG   Side Muscle Nerve Root Ins Act Fibs Psw Amp Dur Poly Recrt Int Fraser Din Comment  Left Abd Poll Brev Median C8-T1 *Incr *4+ *4+ *Incr Nml 0 *Reduced Nml   Left 1stDorInt Ulnar C8-T1 Nml Nml Nml Nml Nml 0 Nml Nml     Nerve Conduction Studies Anti Sensory Left/Right Comparison   Stim Site L Lat (ms) R Lat (ms) L-R Lat (ms) L Amp (V) R Amp (V) L-R Amp (%) Site1 Site2 L Vel (m/s) R Vel (m/s) L-R Vel (m/s)  Median Acr Palm Anti Sensory (2nd Digit)  33.3C  Wrist       Wrist Palm     Palm             Radial Anti Sensory (Base 1st Digit)  32.8C  Wrist 1.8   22.0   Wrist Base 1st Digit     Ulnar Anti Sensory (5th Digit)  33.4C  Wrist 3.2   21.7   Wrist 5th Digit 44     Motor Left/Right Comparison   Stim Site L Lat (ms) R Lat (ms) L-R Lat (ms) L Amp (mV) R Amp (mV) L-R Amp (%) Site1 Site2 L Vel (m/s) R Vel (m/s) L-R Vel (m/s)  Median Motor (Abd Poll Brev)  33.2C  Wrist *4.7   *0.4   Elbow Wrist *33    Elbow 10.7   0.8         Ulnar Motor (Abd Dig Min)  33.4C  Wrist 2.6   6.5   B Elbow Wrist 62    B Elbow 5.5   6.1   A Elbow B Elbow 63    A Elbow 7.0   5.7               Clinical History: No specialty comments available.  She reports that she quit smoking about 38 years ago. She has never used smokeless tobacco. No results for input(s): HGBA1C, LABURIC in the last 8760 hours.  Objective:  VS:  HT:    WT:   BMI:     BP:   HR: bpm  TEMP: ( )  RESP:  Physical Exam  Musculoskeletal:  Inspection reveals no atrophy of the bilateral  FDI or hand intrinsics but there is atrophy of the left APB compared to right. There is no swelling, color changes, allodynia or dystrophic changes. There is 5 out of 5  strength in the bilateral wrist extension, finger abduction and long  finger flexion. There is decreased sensation to light touch in a median nerve distribution on the left compared to right.. There is a positive Tinel's test at the left wrist but not at the elbow. There is a negative Hoffmann's test bilaterally.    Ortho Exam Imaging: No results found.  Past Medical/Family/Surgical/Social History: Medications & Allergies reviewed per EMR Patient Active Problem List   Diagnosis Date Noted  . Rheumatoid arthritis of multiple sites with negative rheumatoid factor ([place]) 10/06/2016  . High risk medication use 10/06/2016  . Dyslipidemia 10/06/2016  . Pain in both hands 09/08/2016  . Essential hypertension 09/08/2016  . Type 2 diabetes mellitus  09/08/2016  . Gastroesophageal reflux disease without esophagitis 09/08/2016  . Osteopenia of multiple sites 09/08/2016  . History of anxiety 09/08/2016  . Benign paroxysmal positional vertigo 09/08/2016  . Smoker 09/08/2016  . Sleep apnea 09/08/2016   History reviewed. No pertinent past medical history. History reviewed. No pertinent family history. History reviewed. No pertinent surgical history. Social History   Occupational History  . Not on file.   Social History Main Topics  . Smoking status: Former Smoker    Quit date: 07/05/1978  . Smokeless tobacco: Never Used  . Alcohol use No  . Drug use: Unknown  . Sexual activity: Not on file

## 2016-11-08 ENCOUNTER — Other Ambulatory Visit: Payer: Self-pay

## 2016-11-08 ENCOUNTER — Other Ambulatory Visit: Payer: Self-pay | Admitting: Radiology

## 2016-11-08 DIAGNOSIS — Z79899 Other long term (current) drug therapy: Secondary | ICD-10-CM

## 2016-11-08 DIAGNOSIS — G5602 Carpal tunnel syndrome, left upper limb: Secondary | ICD-10-CM

## 2016-11-08 LAB — COMPLETE METABOLIC PANEL WITH GFR
ALT: 13 U/L (ref 6–29)
AST: 14 U/L (ref 10–35)
Albumin: 4.2 g/dL (ref 3.6–5.1)
Alkaline Phosphatase: 74 U/L (ref 33–130)
BUN: 15 mg/dL (ref 7–25)
CO2: 27 mmol/L (ref 20–31)
Calcium: 9.3 mg/dL (ref 8.6–10.4)
Chloride: 99 mmol/L (ref 98–110)
Creat: 0.93 mg/dL (ref 0.60–0.93)
GFR, Est African American: 71 mL/min (ref >=60)
GFR, Est Non African American: 61 mL/min (ref >=60)
Glucose, Bld: 238 mg/dL — ABNORMAL HIGH (ref 65–99)
Potassium: 4.5 mmol/L (ref 3.5–5.3)
Sodium: 137 mmol/L (ref 135–146)
Total Bilirubin: 0.9 mg/dL (ref 0.2–1.2)
Total Protein: 6.6 g/dL (ref 6.1–8.1)

## 2016-11-08 LAB — CBC WITH DIFFERENTIAL/PLATELET
Basophils Absolute: 121 cells/uL (ref 0–200)
Basophils Relative: 1 %
Eosinophils Absolute: 242 cells/uL (ref 15–500)
Eosinophils Relative: 2 %
HCT: 36.3 % (ref 35.0–45.0)
Hemoglobin: 12.4 g/dL (ref 11.7–15.5)
Lymphocytes Relative: 18 %
Lymphs Abs: 2178 cells/uL (ref 850–3900)
MCH: 30.9 pg (ref 27.0–33.0)
MCHC: 34.2 g/dL (ref 32.0–36.0)
MCV: 90.5 fL (ref 80.0–100.0)
MPV: 9.5 fL (ref 7.5–12.5)
Monocytes Absolute: 726 cells/uL (ref 200–950)
Monocytes Relative: 6 %
Neutro Abs: 8833 cells/uL — ABNORMAL HIGH (ref 1500–7800)
Neutrophils Relative %: 73 %
Platelets: 376 10*3/uL (ref 140–400)
RBC: 4.01 MIL/uL (ref 3.80–5.10)
RDW: 15.9 % — ABNORMAL HIGH (ref 11.0–15.0)
WBC: 12.1 10*3/uL — ABNORMAL HIGH (ref 3.8–10.8)

## 2016-11-08 NOTE — Procedures (Signed)
EMG & NCV Findings: Evaluation of the left median motor nerve showed prolonged distal onset latency (4.7 ms), reduced amplitude (0.4 mV), and decreased conduction velocity (Elbow-Wrist, 33 m/s).  The left median (across palm) sensory nerve showed no response (Wrist) and no response (Palm).  All remaining nerves (as indicated in the following tables) were within normal limits.    Needle evaluation of the left abductor pollicis brevis muscle showed increased insertional activity, widespread spontaneous activity, increased motor unit amplitude, and diminished recruitment.  All remaining muscles (as indicated in the following table) showed no evidence of electrical instability.    Impression: The above electrodiagnostic study is ABNORMAL and reveals evidence of a severe left median nerve entrapment at the wrist (carpal tunnel syndrome) affecting sensory and motor components. The lesion is characterized by sensory and motor demyelination with evidence of axonal injury.   Recommendations: 1.  Follow-up with referring physician. 2.  Continue current management of symptoms. 3.  Suggest surgical evaluation.    Nerve Conduction Studies Anti Sensory Summary Table   Stim Site NR Peak (ms) Norm Peak (ms) P-T Amp (V) Norm P-T Amp Site1 Site2 Delta-P (ms) Dist (cm) Vel (m/s) Norm Vel (m/s)  Left Median Acr Palm Anti Sensory (2nd Digit)  33.3C  Wrist *NR  <3.6  >10 Wrist Palm  0.0    Palm *NR  <2.0          Left Radial Anti Sensory (Base 1st Digit)  32.8C  Wrist    1.8 <3.1 22.0  Wrist Base 1st Digit 1.8 0.0    Left Ulnar Anti Sensory (5th Digit)  33.4C  Wrist    3.2 <3.7 21.7 >15.0 Wrist 5th Digit 3.2 14.0 44 >38   Motor Summary Table   Stim Site NR Onset (ms) Norm Onset (ms) O-P Amp (mV) Norm O-P Amp Site1 Site2 Delta-0 (ms) Dist (cm) Vel (m/s) Norm Vel (m/s)  Left Median Motor (Abd Poll Brev)  33.2C  Wrist    *4.7 <4.2 *0.4 >5 Elbow Wrist 6.0 20.0 *33 >50  Elbow    10.7  0.8         Left  Ulnar Motor (Abd Dig Min)  33.4C  Wrist    2.6 <4.2 6.5 >3 B Elbow Wrist 2.9 18.0 62 >53  B Elbow    5.5  6.1  A Elbow B Elbow 1.5 9.5 63 >53  A Elbow    7.0  5.7          EMG   Side Muscle Nerve Root Ins Act Fibs Psw Amp Dur Poly Recrt Int Fraser Din Comment  Left Abd Poll Brev Median C8-T1 *Incr *4+ *4+ *Incr Nml 0 *Reduced Nml   Left 1stDorInt Ulnar C8-T1 Nml Nml Nml Nml Nml 0 Nml Nml     Nerve Conduction Studies Anti Sensory Left/Right Comparison   Stim Site L Lat (ms) R Lat (ms) L-R Lat (ms) L Amp (V) R Amp (V) L-R Amp (%) Site1 Site2 L Vel (m/s) R Vel (m/s) L-R Vel (m/s)  Median Acr Palm Anti Sensory (2nd Digit)  33.3C  Wrist       Wrist Palm     Palm             Radial Anti Sensory (Base 1st Digit)  32.8C  Wrist 1.8   22.0   Wrist Base 1st Digit     Ulnar Anti Sensory (5th Digit)  33.4C  Wrist 3.2   21.7   Wrist 5th Digit 44  Motor Left/Right Comparison   Stim Site L Lat (ms) R Lat (ms) L-R Lat (ms) L Amp (mV) R Amp (mV) L-R Amp (%) Site1 Site2 L Vel (m/s) R Vel (m/s) L-R Vel (m/s)  Median Motor (Abd Poll Brev)  33.2C  Wrist *4.7   *0.4   Elbow Wrist *33    Elbow 10.7   0.8         Ulnar Motor (Abd Dig Min)  33.4C  Wrist 2.6   6.5   B Elbow Wrist 62    B Elbow 5.5   6.1   A Elbow B Elbow 63    A Elbow 7.0   5.7

## 2016-11-08 NOTE — Telephone Encounter (Signed)
Please refer to Dr.Xu for left carpal tunnel release

## 2016-11-08 NOTE — Telephone Encounter (Signed)
Patient came in for a lab draw today and has asked about the nerve study. She had a severe left CTS on the nerve study. I have told her I do not think you have reviewed the study yet, but I will ask you to. She states if she has to have CTR she would like to see Dr Erlinda Hong. Please review study and advise

## 2016-11-09 MED ORDER — METHOTREXATE SODIUM CHEMO INJECTION 50 MG/2ML: 20.0000 mg | INTRAMUSCULAR | 0 refills | Status: DC

## 2016-11-09 NOTE — Telephone Encounter (Signed)
ok 

## 2016-11-09 NOTE — Progress Notes (Signed)
Glucose is elevated, WBC elevated which is related to prednisone use

## 2016-11-09 NOTE — Telephone Encounter (Signed)
Patient advised referral has been made to Dr. Erlinda Hong. Patient advised Dr. Estanislado Pandy reviewed nerve study and she has left severe CTS. Patient verbalized understanding. Patient states she needs refill on MTX.   Last Visit: 10/08/16 Next Visit: 12/09/16 Labs: 11/09/16 Elevated glucose and WBC due to prednisone use  Okay to refill MTX?

## 2016-11-09 NOTE — Addendum Note (Signed)
Addended by: Carole Binning on: 11/09/2016 03:51 PM   Modules accepted: Orders

## 2016-11-09 NOTE — Telephone Encounter (Signed)
Referral placed for Dr. Erlinda Hong.

## 2016-11-09 NOTE — Telephone Encounter (Signed)
Attempted to contact the patient and left message for patient to call the office.  

## 2016-11-15 ENCOUNTER — Ambulatory Visit (INDEPENDENT_AMBULATORY_CARE_PROVIDER_SITE_OTHER): Payer: Medicare Other | Admitting: Orthopaedic Surgery

## 2016-11-15 ENCOUNTER — Encounter (INDEPENDENT_AMBULATORY_CARE_PROVIDER_SITE_OTHER): Payer: Self-pay | Admitting: Orthopaedic Surgery

## 2016-11-15 DIAGNOSIS — G5602 Carpal tunnel syndrome, left upper limb: Secondary | ICD-10-CM | POA: Diagnosis not present

## 2016-11-15 NOTE — Progress Notes (Signed)
Office Visit Note   Patient: Annette Suarez           Date of Birth: [birth year]           MRN: [id number] Visit Date: 11/15/2016              Requested by: Shirline Frees, MD [address], [place] [address] PCP: Shirline Frees, MD   Assessment & Plan: Visit Diagnoses:  1. Left carpal tunnel syndrome     Plan: I reviewed the nerve conduction studies which does show severe carpal tunnel syndrome with signs of axonal injury and demyelination. We do need to proceed with surgical release at this point. She understands risks benefits of surgery including infection, neurovascular injury, continued numbness postoperatively, temporary worsening of pain. She understands and wished to proceed. We'll get her scheduled in near future.  Follow-Up Instructions: Return for 2 week postop visit.   Orders:  No orders of the defined types were placed in this encounter.  No orders of the defined types were placed in this encounter.     Procedures: No procedures performed   Clinical Data: No additional findings.   Subjective: Chief Complaint  Patient presents with  . Left Hand - Pain    Patient is a 76 year old female comes in today with a history of rheumatoid arthritis. She comes in for left carpal tunnel syndrome diagnosed by nerve conduction studies with Dr. Ernestina Patches. It did show that it was severe. She endorses constant pain and stiffness and burning numbness worse at night.    Review of Systems  Constitutional: Negative.   HENT: Negative.   Eyes: Negative.   Respiratory: Negative.   Cardiovascular: Negative.   Endocrine: Negative.   Musculoskeletal: Negative.   Neurological: Negative.   Hematological: Negative.   Psychiatric/Behavioral: Negative.   All other systems reviewed and are negative.    Objective: Vital Signs: There were no vitals taken for this visit.  Physical Exam  Constitutional: She is oriented to person, place, and time. She appears  well-developed and well-nourished.  HENT:  Head: Normocephalic and atraumatic.  Eyes: EOM are normal.  Neck: Neck supple.  Pulmonary/Chest: Effort normal.  Abdominal: Soft.  Neurological: She is alert and oriented to person, place, and time.  Skin: Skin is warm. Capillary refill takes less than 2 seconds.  Psychiatric: She has a normal mood and affect. Her behavior is normal. Judgment and thought content normal.  Nursing note and vitals reviewed.   Ortho Exam Left hand exam does show flattening of the thenar muscles. APB is 4 out of 5 strength. Positive carpal tunnel compression signs. Specialty Comments:  No specialty comments available.  Imaging: No results found.   PMFS History: Patient Active Problem List   Diagnosis Date Noted  . Left carpal tunnel syndrome 11/15/2016  . Rheumatoid arthritis of multiple sites with negative rheumatoid factor ([place]) 10/06/2016  . High risk medication use 10/06/2016  . Dyslipidemia 10/06/2016  . Pain in both hands 09/08/2016  . Essential hypertension 09/08/2016  . Type 2 diabetes mellitus  09/08/2016  . Gastroesophageal reflux disease without esophagitis 09/08/2016  . Osteopenia of multiple sites 09/08/2016  . History of anxiety 09/08/2016  . Benign paroxysmal positional vertigo 09/08/2016  . Smoker 09/08/2016  . Sleep apnea 09/08/2016   No past medical history on file.  No family history on file.  No past surgical history on file. Social History   Occupational History  . Not on file.   Social History  Main Topics  . Smoking status: Former Smoker    Quit date: 07/05/1978  . Smokeless tobacco: Never Used  . Alcohol use No  . Drug use: Unknown  . Sexual activity: Not on file

## 2016-11-16 ENCOUNTER — Telehealth: Payer: Self-pay | Admitting: Rheumatology

## 2016-11-16 LAB — HM DIABETES EYE EXAM

## 2016-11-16 NOTE — Telephone Encounter (Signed)
Patient states she woke up this morning in severe pain in both hands and wrist and the base of her neck on both sides. Patient states the pain has subsided some. Patient states she is using Tylenol Arthritis. Patient states she is having trouble using her hands. Patient is scheduled to have CTR with Dr. Erlinda Hong on 11/19/16. Patient state she had a baseline PLQ eye exam in consideration of the possibility of starting her on PLQ. Patient is currently on MTX.

## 2016-11-16 NOTE — Telephone Encounter (Signed)
Patient having severe pain in neck, and both wrists. Patient had eye exam today. Question if she needs to come back for blood work in two week as she has been doing in the past. Patient scheduled for CTR on lt hand. Patient questions a different medicine for pain. Please call to discuss.

## 2016-11-16 NOTE — Telephone Encounter (Signed)
I spoke with patient. She is having a lot of discomfort in her hands. She is been taking methotrexate 0.8 ML subcutaneously per week. I've advised her to stay on the medication and do not stop for the surgery. We discussed possible use of Plaquenil although it can't have any immediate benefit. She would like to hold off until the surgery. I would prefer not to put her on prednisone as she'll be having carpal tunnel release soon although that can be started after her surgery. I also discussed possible use of gabapentin and tramadol but she declined. She will take Tylenol for right now.

## 2016-11-19 DIAGNOSIS — G5602 Carpal tunnel syndrome, left upper limb: Secondary | ICD-10-CM | POA: Diagnosis not present

## 2016-11-22 ENCOUNTER — Telehealth: Payer: Self-pay | Admitting: Rheumatology

## 2016-11-22 NOTE — Telephone Encounter (Signed)
Patient's husband called and states patient is supposed to be changing arthritis meds. She has had her eyes examined and her carpal tunnel surgery done. Please call patient or her husband about the medication change.

## 2016-11-22 NOTE — Telephone Encounter (Signed)
You spoke to patient about adding PLQ do you want her come in before her follow up on June 6th to discuss?   You also mentioned prednisone taper, but she had CTR scheduled, which she has now had done  Please advise.

## 2016-11-23 MED ORDER — HYDROXYCHLOROQUINE SULFATE 200 MG PO TABS: ORAL_TABLET | ORAL | 2 refills | Status: DC

## 2016-11-23 NOTE — Telephone Encounter (Signed)
Patient advised she may start PLQ BID Monday- Friday along with her MTX. Patient advised she would need to continue the yearly eye exam. Patient asked how long she before the medicine would take effect. Patient advise we give it a good 3 months of her being on the medication to evaluate its effect. Patient advised she may notice it helping sooner. Patient verbalized understanding. Prescription sent to the pharmacy.

## 2016-11-23 NOTE — Telephone Encounter (Signed)
She may start Plaquenil 200 mg by mouth twice a day Monday to Friday. She will continue to take methotrexate this will be a combination therapy. She will need eye exam on a yearly basis after baseline eye exam. We already have consent on her.

## 2016-12-02 NOTE — Progress Notes (Signed)
I can not interpret this opth report. Please, ask the form for PLQ eye exam to her opth.

## 2016-12-03 ENCOUNTER — Encounter (INDEPENDENT_AMBULATORY_CARE_PROVIDER_SITE_OTHER): Payer: Self-pay | Admitting: Orthopaedic Surgery

## 2016-12-03 ENCOUNTER — Ambulatory Visit (INDEPENDENT_AMBULATORY_CARE_PROVIDER_SITE_OTHER): Payer: Medicare Other | Admitting: Orthopaedic Surgery

## 2016-12-03 DIAGNOSIS — G5601 Carpal tunnel syndrome, right upper limb: Secondary | ICD-10-CM

## 2016-12-03 DIAGNOSIS — G5602 Carpal tunnel syndrome, left upper limb: Secondary | ICD-10-CM

## 2016-12-03 NOTE — Progress Notes (Signed)
Patient is 2 weeks status post left carpal tunnel release. She states she has no pain but still has persistent numbness. The incisions have healed. The sutures were removed. She does also have hand arthritis which has caused her to have some stiffness. I did tell her today that the numbness tends to persist longer than the pain especially since she has severe carpal tunnel syndrome with evidence of axonal injury.  I went ahead and put her in hand therapy and I'll like to see her back in about 4 weeks for recheck. Questions encouraged and answered.

## 2016-12-03 NOTE — Progress Notes (Signed)
Office Visit Note  Patient: Annette Suarez             Date of Birth: 21-Mar-1941           MRN: [id number]             PCP: Shirline Frees, MD Referring: Shirline Frees, MD Visit Date: 12/09/2016 Occupation: @GUAROCC @    Subjective:  Pain and swelling in bilateral hands.   History of Present Illness: Annette Suarez is a 76 y.o. female with seronegative rheumatoid arthritis. She states she is not any better on the current combination of medications. The Plaquenil causes diarrhea and she has to take Imodium at that. Despite of combination of methotrexate and Plaquenil she continues to have swelling and discomfort in her bilateral hands, her shoulders and neck.  Activities of Daily Living:  Patient reports morning stiffness for4-5  hours.   Patient Reports nocturnal pain.  Difficulty dressing/grooming: Reports Difficulty climbing stairs: Denies Difficulty getting out of chair: Denies Difficulty using hands for taps, buttons, cutlery, and/or writing: Reports   Review of Systems  Constitutional: Positive for fatigue. Negative for night sweats, weight gain, weight loss and weakness.  HENT: Negative for mouth sores, trouble swallowing, trouble swallowing, mouth dryness and nose dryness.   Eyes: Negative for pain, redness, visual disturbance and dryness.  Respiratory: Negative for cough, shortness of breath and difficulty breathing.   Cardiovascular: Negative for chest pain, palpitations, hypertension, irregular heartbeat and swelling in legs/feet.  Gastrointestinal: Negative for blood in stool, constipation and diarrhea.  Endocrine: Negative for increased urination.  Genitourinary: Negative for vaginal dryness.  Musculoskeletal: Positive for arthralgias, joint pain, joint swelling and morning stiffness. Negative for myalgias, muscle weakness, muscle tenderness and myalgias.  Skin: Negative for color change, rash, hair loss, skin tightness, ulcers and sensitivity to sunlight.    Allergic/Immunologic: Negative for susceptible to infections.  Neurological: Negative for dizziness, memory loss and night sweats.  Hematological: Negative for swollen glands.  Psychiatric/Behavioral: Positive for depressed mood and sleep disturbance. The patient is nervous/anxious.     PMFS History:  Patient Active Problem List   Diagnosis Date Noted  . Left carpal tunnel syndrome 11/15/2016  . Rheumatoid arthritis of multiple sites with negative rheumatoid factor ([place]) 10/06/2016  . High risk medication use 10/06/2016  . Dyslipidemia 10/06/2016  . Pain in both hands 09/08/2016  . Essential hypertension 09/08/2016  . Type 2 diabetes mellitus  09/08/2016  . Gastroesophageal reflux disease without esophagitis 09/08/2016  . Osteopenia of multiple sites 09/08/2016  . History of anxiety 09/08/2016  . Benign paroxysmal positional vertigo 09/08/2016  . Smoker 09/08/2016  . Sleep apnea 09/08/2016    No past medical history on file.  No family history on file. No past surgical history on file. Social History   Social History Narrative  . No narrative on file     Objective: Vital Signs: There were no vitals taken for this visit.   Physical Exam  Constitutional: She is oriented to person, place, and time. She appears well-developed and well-nourished.  HENT:  Head: Normocephalic and atraumatic.  Eyes: Conjunctivae and EOM are normal.  Neck: Normal range of motion.  Cardiovascular: Normal rate, regular rhythm, normal heart sounds and intact distal pulses.   Pulmonary/Chest: Effort normal and breath sounds normal.  Abdominal: Soft. Bowel sounds are normal.  Lymphadenopathy:    She has no cervical adenopathy.  Neurological: She is alert and oriented to person, place, and time.  Skin: Skin is warm and dry.  Capillary refill takes less than 2 seconds.  Psychiatric: She has a normal mood and affect. Her behavior is normal.  Nursing note and vitals reviewed.    Musculoskeletal  Exam: C-spine and thoracic lumbar spine good range of motion. She is some discomfort range of motion of her C-spine. She had discomfort range of motion of her right shoulder and right elbow. She has synovitis over bilateral wrist joints bilateral MCP joints and bilateral PIP joints as described below she has difficulty making a fist. Hip joints knee joints ankles MTPs PIPs with good range of motion with no synovitis.  CDAI Exam: CDAI Homunculus Exam:   Tenderness:  RUE: glenohumeral, ulnohumeral and radiohumeral and wrist LUE: wrist Right hand: 1st MCP, 2nd MCP, 3rd MCP, 4th MCP, 5th MCP, 2nd PIP, 3rd PIP, 4th PIP and 5th PIP Left hand: 1st MCP, 2nd MCP, 3rd MCP, 4th MCP, 5th MCP, 2nd PIP, 3rd PIP, 4th PIP and 5th PIP  Swelling:  RUE: wrist LUE: wrist Right hand: 1st MCP, 2nd MCP, 3rd MCP, 4th MCP, 5th MCP, 2nd PIP, 3rd PIP and 4th PIP Left hand: 2nd MCP, 3rd MCP, 4th MCP, 2nd PIP and 3rd PIP  Joint Counts:  CDAI Tender Joint count: 22 CDAI Swollen Joint count: 15  Global Assessments:  Patient Global Assessment: 9 Provider Global Assessment: 9  CDAI Calculated Score: 55    Investigation: No additional findings. CBC Latest Ref Rng & Units 11/08/2016 10/25/2016 10/08/2016  WBC 3.8 - 10.8 K/uL 12.1(H) 9.7 16.9(H)  Hemoglobin 11.7 - 15.5 g/dL 12.4 11.6(L) 12.3  Hematocrit 35.0 - 45.0 % 36.3 35.0 36.6  Platelets 140 - 400 K/uL 376 314 361   CMP Latest Ref Rng & Units 11/08/2016 10/25/2016 10/08/2016  Glucose 65 - 99 mg/dL 238(H) 215(H) 162(H)  BUN 7 - 25 mg/dL 15 20 18   Creatinine 0.60 - 0.93 mg/dL 0.93 0.89 0.85  Sodium 135 - 146 mmol/L 137 140 138  Potassium 3.5 - 5.3 mmol/L 4.5 4.2 4.5  Chloride 98 - 110 mmol/L 99 104 100  CO2 20 - 31 mmol/L 27 23 26   Calcium 8.6 - 10.4 mg/dL 9.3 8.9 9.1  Total Protein 6.1 - 8.1 g/dL 6.6 6.3 6.8  Total Bilirubin 0.2 - 1.2 mg/dL 0.9 0.9 0.9  Alkaline Phos 33 - 130 U/L 74 78 87  AST 10 - 35 U/L 14 11 16   ALT 6 - 29 U/L 13 11 20       Imaging: No results found.  Speciality Comments: No specialty comments available.    Procedures:  No procedures performed Allergies: Latex   Assessment / Plan:     Visit Diagnoses: Rheumatoid arthritis of multiple sites with negative rheumatoid factor [facility]): Patient is having a flare with increased pain and swelling in multiple joints. She's having difficulty with her daily activities. She reports diarrhea on Plaquenil. We will discontinue Plaquenil. Different treatment options and their side effects were discussed at length. After reviewing indications side effects contraindications she wants to proceed with Enbrel. A handout was given consent was taken. We'll apply for Enbrel. Once approved we will start the injection as soon as possible. She is diabetic I'll give her low-dose prednisone as a bridging therapy. Prescription was called in for prednisone 5 mg by mouth daily total 30 tablets with one refill.  High risk medication use - Methotrexate 20 mg subcutaneous every week, folic acid 2 mg by mouth daily, Plaquenil 200 mg twice a day Monday to Friday  Pain in both hands: She  has synovitis in bilateral hands and wrists joints  Left carpal tunnel syndrome: Most likely due to severe swelling  Osteopenia of multiple sites  History of sleep apnea  History of diabetes mellitus: Patient was advised to monitor blood sugar closely  History of hypertension: Monitor blood pressure closely  Smoker  History of hyperlipidemia  History of anxiety  History of vertigo    Orders: No orders of the defined types were placed in this encounter.  No orders of the defined types were placed in this encounter.   Face-to-face time spent with patient was 30 minutes. 50% of time was spent in counseling and coordination of care.  Follow-Up Instructions: No Follow-up on file.   Bo Merino, MD  Note - This record has been created using Editor, commissioning.  Chart creation errors have  been sought, but may not always  have been located. Such creation errors do not reflect on  the standard of medical care.

## 2016-12-07 ENCOUNTER — Telehealth (INDEPENDENT_AMBULATORY_CARE_PROVIDER_SITE_OTHER): Payer: Self-pay | Admitting: Orthopaedic Surgery

## 2016-12-07 NOTE — Telephone Encounter (Signed)
FAXED

## 2016-12-07 NOTE — Telephone Encounter (Signed)
Elmyra Ricks with [facility] on [address] called requesting a Rx for the patient and any op notes, MRI report and X-rays. Elmyra Ricks also request when the patient has surgery. The fax# is [phone]   The ph# is [phone]

## 2016-12-09 ENCOUNTER — Ambulatory Visit (INDEPENDENT_AMBULATORY_CARE_PROVIDER_SITE_OTHER): Payer: Medicare Other | Admitting: Rheumatology

## 2016-12-09 ENCOUNTER — Encounter: Payer: Self-pay | Admitting: Rheumatology

## 2016-12-09 ENCOUNTER — Telehealth: Payer: Self-pay

## 2016-12-09 VITALS — BP 121/77 | HR 86 | Resp 14 | Ht 62.0 in | Wt 151.0 lb

## 2016-12-09 DIAGNOSIS — Z87898 Personal history of other specified conditions: Secondary | ICD-10-CM

## 2016-12-09 DIAGNOSIS — Z8639 Personal history of other endocrine, nutritional and metabolic disease: Secondary | ICD-10-CM | POA: Diagnosis not present

## 2016-12-09 DIAGNOSIS — M0609 Rheumatoid arthritis without rheumatoid factor, multiple sites: Secondary | ICD-10-CM

## 2016-12-09 DIAGNOSIS — Z8669 Personal history of other diseases of the nervous system and sense organs: Secondary | ICD-10-CM | POA: Diagnosis not present

## 2016-12-09 DIAGNOSIS — G5602 Carpal tunnel syndrome, left upper limb: Secondary | ICD-10-CM | POA: Diagnosis not present

## 2016-12-09 DIAGNOSIS — M79641 Pain in right hand: Secondary | ICD-10-CM

## 2016-12-09 DIAGNOSIS — Z8679 Personal history of other diseases of the circulatory system: Secondary | ICD-10-CM

## 2016-12-09 DIAGNOSIS — F17200 Nicotine dependence, unspecified, uncomplicated: Secondary | ICD-10-CM | POA: Diagnosis not present

## 2016-12-09 DIAGNOSIS — M79642 Pain in left hand: Secondary | ICD-10-CM | POA: Diagnosis not present

## 2016-12-09 DIAGNOSIS — Z79899 Other long term (current) drug therapy: Secondary | ICD-10-CM | POA: Diagnosis not present

## 2016-12-09 DIAGNOSIS — M8589 Other specified disorders of bone density and structure, multiple sites: Secondary | ICD-10-CM

## 2016-12-09 DIAGNOSIS — Z8659 Personal history of other mental and behavioral disorders: Secondary | ICD-10-CM

## 2016-12-09 MED ORDER — PREDNISONE 5 MG PO TABS: 5.0000 mg | ORAL_TABLET | Freq: Every day | ORAL | 2 refills | Status: DC

## 2016-12-09 NOTE — Patient Instructions (Signed)
Etanercept injection What is this medicine? ETANERCEPT (et a NER sept) is used for the treatment of rheumatoid arthritis in adults and children. The medicine is also used to treat psoriatic arthritis, ankylosing spondylitis, and psoriasis. This medicine may be used for other purposes; ask your health care provider or pharmacist if you have questions. COMMON BRAND NAME(S): Enbrel What should I tell my health care provider before I take this medicine? They need to know if you have any of these conditions: -blood disorders -cancer -congestive heart failure -diabetes -exposure to chickenpox -immune system problems -infection -multiple sclerosis -seizure disorder -tuberculosis, a positive skin test for tuberculosis or have recently been in close contact with someone who has tuberculosis -Wegener's granulomatosis -an unusual or allergic reaction to etanercept, latex, other medicines, foods, dyes, or preservatives -pregnant or trying to get pregnant -breast-feeding How should I use this medicine? The medicine is given by injection under the skin. You will be taught how to prepare and give this medicine. Use exactly as directed. Take your medicine at regular intervals. Do not take your medicine more often than directed. It is important that you put your used needles and syringes in a special sharps container. Do not put them in a trash can. If you do not have a sharps container, call your pharmacist or healthcare provider to get one. A special MedGuide will be given to you by the pharmacist with each prescription and refill. Be sure to read this information carefully each time. Talk to your pediatrician regarding the use of this medicine in children. While this drug may be prescribed for children as young as 4 years of age for selected conditions, precautions do apply. Overdosage: If you think you have taken too much of this medicine contact a poison control center or emergency room at once. NOTE:  This medicine is only for you. Do not share this medicine with others. What if I miss a dose? If you miss a dose, contact your health care professional to find out when you should take your next dose. Do not take double or extra doses without advice. What may interact with this medicine? Do not take this medicine with any of the following medications: -anakinra This medicine may also interact with the following medications: -cyclophosphamide -sulfasalazine -vaccines This list may not describe all possible interactions. Give your health care provider a list of all the medicines, herbs, non-prescription drugs, or dietary supplements you use. Also tell them if you smoke, drink alcohol, or use illegal drugs. Some items may interact with your medicine. What should I watch for while using this medicine? Tell your doctor or healthcare professional if your symptoms do not start to get better or if they get worse. You will be tested for tuberculosis (TB) before you start this medicine. If your doctor prescribes any medicine for TB, you should start taking the TB medicine before starting this medicine. Make sure to finish the full course of TB medicine. Call your doctor or health care professional for advice if you get a fever, chills or sore throat, or other symptoms of a cold or flu. Do not treat yourself. This drug decreases your body's ability to fight infections. Try to avoid being around people who are sick. What side effects may I notice from receiving this medicine? Side effects that you should report to your doctor or health care professional as soon as possible: -allergic reactions like skin rash, itching or hives, swelling of the face, lips, or tongue -changes in vision -fever, chills   or any other sign of infection -numbness or tingling in legs or other parts of the body -red, scaly patches or raised bumps on the skin -shortness of breath or difficulty breathing -swollen lymph nodes in the  neck, underarm, or groin areas -unexplained weight loss -unusual bleeding or bruising -unusual swelling or fluid retention in the legs -unusually weak or tired Side effects that usually do not require medical attention (report to your doctor or health care professional if they continue or are bothersome): -dizziness -headache -nausea -redness, itching, or swelling at the injection site -vomiting This list may not describe all possible side effects. Call your doctor for medical advice about side effects. You may report side effects to FDA at 1-800-FDA-1088. Where should I keep my medicine? Keep out of the reach of children. Store between 2 and 8 degrees C (36 and 46 degrees F). Do not freeze or shake. Protect from light. Throw away any unused medicine after the expiration date. You will be instructed on how to store this medicine. NOTE: This sheet is a summary. It may not cover all possible information. If you have questions about this medicine, talk to your doctor, pharmacist, or health care provider.  2018 Elsevier/Gold Standard (2011-12-27 15:33:36)  

## 2016-12-09 NOTE — Telephone Encounter (Signed)
Received a fax from [employer] regarding a prior authorization approval for Enbrel through 07/04/2017  Reference number: [id number] Phone number:[phone]  Will scan document to scan center.   Left message for patient to call back. She has a high co-pay. Will offer to apply to [place] for patient assistance.    Juandiego Kolenovic, [place], CPhT   2:09 PM

## 2016-12-09 NOTE — Telephone Encounter (Signed)
Patient returned call. Informed her of the authorization approval. She agreed to start the [employer] application. She will come by on Monday to sign her part. We we fax the application after she signs. Patient voices understanding and denies any questions at this time.   Annette Suarez, [place], CPhT 2:52 PM

## 2016-12-09 NOTE — Telephone Encounter (Signed)
A prior authorization was submitted for Enbrel through Cover My Meds. Patient was in office and was provided with an [employer] application if needed. Will update and contact patient once we receive a response.   Kaoru Benda, [place], CPhT 11:49 AM

## 2016-12-13 ENCOUNTER — Telehealth: Payer: Self-pay

## 2016-12-13 NOTE — Telephone Encounter (Signed)
Patient came in office to sign application to [employer] for Enbrel. Patient portion is complete. We will fax the documents once the provider portion is complete. Can you complete the provider Rx portion to be faxed with the application? Thank you  Orlanda Lemmerman, [place], CPhT  10:27 AM

## 2016-12-13 NOTE — Telephone Encounter (Signed)
Last visit: 12/09/16 Next visit: 01/06/17 Labs: 11/08/16 Glucose is elevated, WBC elevated which is related to prednisone use, 09/08/16 TB negative  Plan per 12/09/16 was to apply for Enbrel.  Noted patient has latex allergy.  Counseled patient that the needle covers on the Enbrel Mini single-dose prefilled cartridge, SureClick autoinjector, and the single-dose prefilled syringes contain dry natural rubber (derivative of latex), which should not be handled by persons sensitive to the substance.  Patient voiced understanding.  Enbrel was approved by patient's insurance but has high copay.  Patient brought patient portion of Enbrel patient assistance application.    Provider form for [employer] was placed on your desk.

## 2016-12-14 ENCOUNTER — Ambulatory Visit (HOSPITAL_COMMUNITY)
Admission: RE | Admit: 2016-12-14 | Discharge: 2016-12-14 | Disposition: A | Discharge status: Home / Self Care | Payer: Medicare Other | Source: Ambulatory Visit | Attending: Family Medicine | Admitting: Family Medicine

## 2016-12-14 ENCOUNTER — Other Ambulatory Visit (HOSPITAL_COMMUNITY): Payer: Self-pay | Admitting: Internal Medicine

## 2016-12-14 DIAGNOSIS — M7989 Other specified soft tissue disorders: Secondary | ICD-10-CM | POA: Diagnosis not present

## 2016-12-14 DIAGNOSIS — M79661 Pain in right lower leg: Secondary | ICD-10-CM

## 2016-12-14 NOTE — Progress Notes (Signed)
VASCULAR LAB PRELIMINARY  PRELIMINARY  PRELIMINARY  PRELIMINARY  Right lower extremity venous duplex completed.    Preliminary report:  No evidence of DVT or superficial thrombosis in the right lower extremity. There is an area of fluid and mixed echoes coursing from the popliteal fossa into the mid calf consistent with a ruptured Baker's cyst.  Cecile Gillispie, RVS 12/14/2016, 4:53 PM

## 2016-12-14 NOTE — Telephone Encounter (Signed)
[employer] Safety net application for Enbrel was faxed to [phone]. Will update once we receive a response.  Phone: [phone]  Demetrios Loll, CPhT 1:34 PM

## 2016-12-17 ENCOUNTER — Encounter (INDEPENDENT_AMBULATORY_CARE_PROVIDER_SITE_OTHER): Payer: Medicare Other | Admitting: Physical Medicine and Rehabilitation

## 2016-12-20 ENCOUNTER — Telehealth: Payer: Self-pay

## 2016-12-20 NOTE — Addendum Note (Signed)
Addended by: Cyndia Skeeters on: 12/20/2016 11:12 AM   Modules accepted: Orders

## 2016-12-20 NOTE — Telephone Encounter (Signed)
Received a fax from [employer] regarding an approval for Enbrel. Patient has been approved from 12/20/16 through 07/04/17. This will be a new start. We will need to set up a nursing visit. Will you call patient to set that up. The medication will be sent to patient's home unless she request otherwise from the foundation. Thanks!  Will send documents to scan center.  Kathye Cipriani, [place], CPhT 10:41 AM

## 2016-12-20 NOTE — Telephone Encounter (Signed)
I spoke to patient.  She scheduled nurse visit for tomorrow, 12/21/16 at 2:00 PM for initial Enbrel injection.  We will start patient using an Enbrel sample.    Elisabeth Most, Pharm.D., BCPS, CPP Clinical Pharmacist Pager: [phone] Phone: [phone] 12/20/2016 11:10 AM

## 2016-12-21 ENCOUNTER — Ambulatory Visit (INDEPENDENT_AMBULATORY_CARE_PROVIDER_SITE_OTHER): Payer: Medicare Other | Admitting: *Deleted

## 2016-12-21 VITALS — BP 153/73 | HR 87

## 2016-12-21 DIAGNOSIS — M0579 Rheumatoid arthritis with rheumatoid factor of multiple sites without organ or systems involvement: Secondary | ICD-10-CM | POA: Diagnosis not present

## 2016-12-21 MED ORDER — ETANERCEPT 50 MG/ML ~~LOC~~ SOSY
50.0000 mg | PREFILLED_SYRINGE | Freq: Once | SUBCUTANEOUS | Status: AC
  Administered 2016-12-21: 50 mg via SUBCUTANEOUS

## 2016-12-21 NOTE — Progress Notes (Signed)
Patient in office for initial start to Enbrel. Patient was given injection in her right thigh. Patient tolerated injection well. Patient was provided with a sample. Patient was monitored in office for 30 minutes after administration for adverse reactions. No adverse reactions noted.   Administrations This Visit    etanercept (ENBREL) 50 MG/ML injection 50 mg    Admin Date 12/21/2016 Action Given Dose 50 mg Route Subcutaneous Administered By Carole Binning, LPN

## 2016-12-21 NOTE — Progress Notes (Signed)
Pharmacy Note  Subjective: Patient presents today to the [facility] to be initiated on Enbrel.  Patient previously consented to Enbrel on 12/09/16 (see Media Tab).  Patient presents to clinic today to receive first dose of Enbrel.  Patient seen by the pharmacist for counseling on Enbrel.    Objective: TB Test: negative (09/08/16) Hepatitis panel: negative (09/08/16)  CBC    Component Value Date/Time   WBC 12.1 (H) 11/08/2016 1402   RBC 4.01 11/08/2016 1402   HGB 12.4 11/08/2016 1402   HCT 36.3 11/08/2016 1402   PLT 376 11/08/2016 1402   MCV 90.5 11/08/2016 1402   MCH 30.9 11/08/2016 1402   MCHC 34.2 11/08/2016 1402   RDW 15.9 (H) 11/08/2016 1402   LYMPHSABS 2,178 11/08/2016 1402   MONOABS 726 11/08/2016 1402   EOSABS 242 11/08/2016 1402   BASOSABS 121 11/08/2016 1402    Assessment/Plan:  Counseled patient that Enbrel is a TNF blocking agent.  Reviewed Enbrel dose of 50 mg once weekly.  Counseled patient on purpose, proper use, and adverse effects of Enbrel.  Reviewed the most common adverse effects including infections, headache, and injection site reactions. Discussed that there is the possibility of an increased risk of malignancy but it is not well understood if this increased risk is due to the medication or the disease state.  Advised patient to get yearly dermatology exams due to risk of skin cancer.  Reviewed the importance of regular labs while on Enbrel therapy.  Advised patient to get standing labs one month after starting Enbrel then every 2 months.  Provided patient with standing lab orders.  Counseled patient that Enbrel should be held prior to scheduled surgery.  Counseled patient to avoid live vaccines while on Enbrel.  Advised patient to get annual influenza vaccine and the pneumococcal vaccine as needed.  Reviewed storage instructions for Enbrel.    Patient was counseled on how to administer subcutaneous Enbrel injection using a demonstration pen.  Patient  received her first Enbrel injection.  Patient was monitored for 30 minutes post injection.  No injection site reaction noted.    Patient scheduled for follow up on 01/06/17.    Elisabeth Most, Pharm.D., BCPS Clinical Pharmacist Pager: [phone] Phone: [phone] 12/21/2016 2:19 PM

## 2016-12-21 NOTE — Patient Instructions (Addendum)
Start taking Enbrel 50 mg once weekly.    Your prescription will be mailed from [facility].  Their phone number is [phone]   Standing Labs We placed an order today for your standing lab work.    Please come back and get your standing labs in 1 month and every 2 months  We have open lab Monday through Friday from 8:30-11:30 AM and 1:30-4 PM at the office of Dr. Tresa Moore, PA.   The office is located at [address] [place], [place], [place] [address] No appointment is necessary.   Labs are drawn by [facility].  You may receive a bill from [facility] for your lab work. If you have any questions regarding directions or hours of operation,  please call [phone].    Etanercept injection What is this medicine? ETANERCEPT (et a [employer]) is used for the treatment of rheumatoid arthritis in adults and children. The medicine is also used to treat psoriatic arthritis, ankylosing spondylitis, and psoriasis. This medicine may be used for other purposes; ask your health care provider or pharmacist if you have questions. COMMON BRAND NAME(S): Enbrel What should I tell my health care provider before I take this medicine? They need to know if you have any of these conditions: -blood disorders -cancer -congestive heart failure -diabetes -exposure to chickenpox -immune system problems -infection -multiple sclerosis -seizure disorder -tuberculosis, a positive skin test for tuberculosis or have recently been in close contact with someone who has tuberculosis -Wegener's granulomatosis -an unusual or allergic reaction to etanercept, latex, other medicines, foods, dyes, or preservatives -pregnant or trying to get pregnant -breast-feeding How should I use this medicine? The medicine is given by injection under the skin. You will be taught how to prepare and give this medicine. Use exactly as directed. Take your medicine at regular intervals. Do not take  your medicine more often than directed. It is important that you put your used needles and syringes in a special sharps container. Do not put them in a trash can. If you do not have a sharps container, call your pharmacist or healthcare provider to get one. A special MedGuide will be given to you by the pharmacist with each prescription and refill. Be sure to read this information carefully each time. Talk to your pediatrician regarding the use of this medicine in children. While this drug may be prescribed for children as young as 52 years of age for selected conditions, precautions do apply. Overdosage: If you think you have taken too much of this medicine contact a poison control center or emergency room at once. NOTE: This medicine is only for you. Do not share this medicine with others. What if I miss a dose? If you miss a dose, contact your health care professional to find out when you should take your next dose. Do not take double or extra doses without advice. What may interact with this medicine? Do not take this medicine with any of the following medications: -anakinra This medicine may also interact with the following medications: -cyclophosphamide -sulfasalazine -vaccines This list may not describe all possible interactions. Give your health care provider a list of all the medicines, herbs, non-prescription drugs, or dietary supplements you use. Also tell them if you smoke, drink alcohol, or use illegal drugs. Some items may interact with your medicine. What should I watch for while using this medicine? Tell your doctor or healthcare professional if your symptoms do not start to get better or if they get  worse. You will be tested for tuberculosis (TB) before you start this medicine. If your doctor prescribes any medicine for TB, you should start taking the TB medicine before starting this medicine. Make sure to finish the full course of TB medicine. Call your doctor or health care  professional for advice if you get a fever, chills or sore throat, or other symptoms of a cold or flu. Do not treat yourself. This drug decreases your body's ability to fight infections. Try to avoid being around people who are sick. What side effects may I notice from receiving this medicine? Side effects that you should report to your doctor or health care professional as soon as possible: -allergic reactions like skin rash, itching or hives, swelling of the face, lips, or tongue -changes in vision -fever, chills or any other sign of infection -numbness or tingling in legs or other parts of the body -red, scaly patches or raised bumps on the skin -shortness of breath or difficulty breathing -swollen lymph nodes in the neck, underarm, or groin areas -unexplained weight loss -unusual bleeding or bruising -unusual swelling or fluid retention in the legs -unusually weak or tired Side effects that usually do not require medical attention (report to your doctor or health care professional if they continue or are bothersome): -dizziness -headache -nausea -redness, itching, or swelling at the injection site -vomiting This list may not describe all possible side effects. Call your doctor for medical advice about side effects. You may report side effects to FDA at 1-800-FDA-1088. Where should I keep my medicine? Keep out of the reach of children. Store between 2 and 8 degrees C (36 and 46 degrees F). Do not freeze or shake. Protect from light. Throw away any unused medicine after the expiration date. You will be instructed on how to store this medicine. NOTE: This sheet is a summary. It may not cover all possible information. If you have questions about this medicine, talk to your doctor, pharmacist, or health care provider.  2018 Elsevier/Gold Standard (2011-12-27 15:33:36)

## 2016-12-30 ENCOUNTER — Encounter (INDEPENDENT_AMBULATORY_CARE_PROVIDER_SITE_OTHER): Payer: Self-pay | Admitting: Orthopaedic Surgery

## 2016-12-30 ENCOUNTER — Ambulatory Visit (INDEPENDENT_AMBULATORY_CARE_PROVIDER_SITE_OTHER): Payer: Medicare Other | Admitting: Orthopaedic Surgery

## 2016-12-30 DIAGNOSIS — G5602 Carpal tunnel syndrome, left upper limb: Secondary | ICD-10-CM

## 2016-12-30 NOTE — Progress Notes (Signed)
Patient is 6 weeks status post left carpal tunnel release. She is doing well. She is just complaining of global numbness in her index finger. Her scar is fully healed. There is no tenderness. She is progressing appropriately with hand therapy. At this point patient is doing very well release her. Follow-up with me as needed.

## 2017-01-03 NOTE — Progress Notes (Signed)
Office Visit Note  Patient: Annette Suarez             Date of Birth: 20-Sep-1940           MRN: [id number]             PCP: Shirline Frees, MD Referring: Shirline Frees, MD Visit Date: 01/06/2017 Occupation: @GUAROCC @    Subjective:  Left hand pain.   History of Present Illness: Annette Suarez is a 76 y.o. female with history of  seronegative rheumatoid arthritis. She was started on an Brohl on 12/20/2016 after being on methotrexate monotherapy. She had left carpal tunnel release in May 2018 and has been going through physical therapy. She has not noticed some improvement since she's been on Enbrel. She states she still have some discomfort in her bilateral hands. Increase activity causes increased pain.  Activities of Daily Living:  Patient reports morning stiffness for 1 hour  Patient Denies nocturnal pain.  Difficulty dressing/grooming: Reports Difficulty climbing stairs: Denies Difficulty getting out of chair: Denies Difficulty using hands for taps, buttons, cutlery, and/or writing: Reports   Review of Systems  Constitutional: Negative for fatigue, night sweats, weight gain, weight loss and weakness.  HENT: Positive for mouth sores. Negative for trouble swallowing, trouble swallowing, mouth dryness and nose dryness.   Eyes: Positive for dryness. Negative for pain, redness and visual disturbance.  Respiratory: Negative.  Negative for cough, shortness of breath and difficulty breathing.   Cardiovascular: Negative.  Negative for chest pain, palpitations, hypertension, irregular heartbeat and swelling in legs/feet.  Gastrointestinal: Negative.  Negative for blood in stool, constipation and diarrhea.  Endocrine: Negative.  Negative for increased urination.  Genitourinary: Negative.  Negative for nocturia and vaginal dryness.  Musculoskeletal: Positive for arthralgias, joint pain, joint swelling, muscle weakness and morning stiffness. Negative for myalgias, muscle tenderness and  myalgias.  Skin: Negative.  Negative for color change, rash, hair loss, skin tightness, ulcers and sensitivity to sunlight.  Allergic/Immunologic: Negative for susceptible to infections.  Neurological: Negative.  Negative for dizziness, headaches, memory loss and night sweats.  Hematological: Negative.  Negative for swollen glands.  Psychiatric/Behavioral: Negative for depressed mood and sleep disturbance. The patient is nervous/anxious.     PMFS History:  Patient Active Problem List   Diagnosis Date Noted  . Left carpal tunnel syndrome 11/15/2016  . Rheumatoid arthritis of multiple sites with negative rheumatoid factor ([place]) 10/06/2016  . High risk medication use 10/06/2016  . Dyslipidemia 10/06/2016  . Pain in both hands 09/08/2016  . Essential hypertension 09/08/2016  . Type 2 diabetes mellitus  09/08/2016  . Gastroesophageal reflux disease without esophagitis 09/08/2016  . Osteopenia of multiple sites 09/08/2016  . History of anxiety 09/08/2016  . Benign paroxysmal positional vertigo 09/08/2016  . Smoker 09/08/2016  . Sleep apnea 09/08/2016    History reviewed. No pertinent past medical history.  History reviewed. No pertinent family history. History reviewed. No pertinent surgical history. Social History   Social History Narrative  . No narrative on file     Objective: Vital Signs: BP (!) 160/74   Pulse 92   Resp 14   Ht 5\' 2"  (1.575 m)   Wt 154 lb (69.9 kg)   BMI 28.17 kg/m    Physical Exam  Constitutional: She is oriented to person, place, and time. She appears well-developed and well-nourished.  HENT:  Head: Normocephalic and atraumatic.  Eyes: Conjunctivae and EOM are normal.  Neck: Normal range of motion.  Cardiovascular: Normal rate, regular  rhythm, normal heart sounds and intact distal pulses.   Pulmonary/Chest: Effort normal and breath sounds normal.  Abdominal: Soft. Bowel sounds are normal.  Lymphadenopathy:    She has no cervical adenopathy.    Neurological: She is alert and oriented to person, place, and time.  Skin: Skin is warm and dry. Capillary refill takes less than 2 seconds.  Psychiatric: She has a normal mood and affect. Her behavior is normal.  Nursing note and vitals reviewed.    Musculoskeletal Exam: C-spine and thoracic lumbar spine good range of motion. Shoulder joints elbow joints wrist joints are good range of motion. She had no synovitis on examination today she had DIP PIP thickening consistent with osteoarthritis. Hip joints knee joints ankles MTPs PIPs DIPs are good range of motion. She has postsurgical changes in her left wrist secondary to carpal tunnel release.  CDAI Exam: CDAI Homunculus Exam:   Joint Counts:  CDAI Tender Joint count: 0 CDAI Swollen Joint count: 0  Global Assessments:  Patient Global Assessment: 6 Provider Global Assessment: 2  CDAI Calculated Score: 8    Investigation: Findings:  09/08/16 Tb gold negative    CBC Latest Ref Rng & Units 11/08/2016 10/25/2016 10/08/2016  WBC 3.8 - 10.8 K/uL 12.1(H) 9.7 16.9(H)  Hemoglobin 11.7 - 15.5 g/dL 12.4 11.6(L) 12.3  Hematocrit 35.0 - 45.0 % 36.3 35.0 36.6  Platelets 140 - 400 K/uL 376 314 361   CMP Latest Ref Rng & Units 11/08/2016 10/25/2016 10/08/2016  Glucose 65 - 99 mg/dL 238(H) 215(H) 162(H)  BUN 7 - 25 mg/dL 15 20 18   Creatinine 0.60 - 0.93 mg/dL 0.93 0.89 0.85  Sodium 135 - 146 mmol/L 137 140 138  Potassium 3.5 - 5.3 mmol/L 4.5 4.2 4.5  Chloride 98 - 110 mmol/L 99 104 100  CO2 20 - 31 mmol/L 27 23 26   Calcium 8.6 - 10.4 mg/dL 9.3 8.9 9.1  Total Protein 6.1 - 8.1 g/dL 6.6 6.3 6.8  Total Bilirubin 0.2 - 1.2 mg/dL 0.9 0.9 0.9  Alkaline Phos 33 - 130 U/L 74 78 87  AST 10 - 35 U/L 14 11 16   ALT 6 - 29 U/L 13 11 20    Imaging: No results found.  Speciality Comments: No specialty comments available.    Procedures:  No procedures performed Allergies: Latex   Assessment / Plan:     Visit Diagnoses: Rheumatoid arthritis of  multiple sites with negative rheumatoid factor ([place]): She has no synovitis on examination today. Although she continues to have some arthralgias which I believe is secondary to underlying osteoarthritis.  High risk medication use - Methotrexate 0.8 ML subcutaneous every week, folic acid 2 mg by mouth daily, Enbrel 50 mg subcutaneous every week(12/20/16) prednisone 5 mg by mouth daily. We will taper her prednisone to 2.5 mg by mouth daily for next 2 weeks and then 2.5 mg every other week for the next 2 weeks. She is also interested in switching to oral methotrexate. I will do that next visit if she continues to do well. - Plan: CBC with Differential/Platelet, COMPLETE METABOLIC PANEL WITH GFR today and then every 3 months.  Pain in both hands: She has some underlying osteoarthritis which contradicts to some of the discomfort.  S/P carpal tunnel release - left by Dr. Erlinda Hong 11/2016  Osteopenia of multiple sites: Followed up by PCP  Smoker: Smoking cessation  History of diabetes mellitus  History of sleep apnea  History of anxiety  History of hyperlipidemia  History of gastroesophageal reflux (  GERD)  High risk medication use - Plan: CBC with Differential/Platelet, COMPLETE METABOLIC PANEL WITH GFR    Orders: No orders of the defined types were placed in this encounter.  No orders of the defined types were placed in this encounter.   Face-to-face time spent with patient was 30 minutes. 50% of time was spent in counseling and coordination of care.  Follow-Up Instructions: Return in about 3 months (around 04/08/2017) for Rheumatoid arthritis, oPenia.   Bo Merino, MD  Note - This record has been created using Editor, commissioning.  Chart creation errors have been sought, but may not always  have been located. Such creation errors do not reflect on  the standard of medical care.

## 2017-01-06 ENCOUNTER — Ambulatory Visit (INDEPENDENT_AMBULATORY_CARE_PROVIDER_SITE_OTHER): Payer: Medicare Other | Admitting: Rheumatology

## 2017-01-06 ENCOUNTER — Encounter: Payer: Self-pay | Admitting: Rheumatology

## 2017-01-06 VITALS — BP 160/74 | HR 92 | Resp 14 | Ht 62.0 in | Wt 154.0 lb

## 2017-01-06 DIAGNOSIS — M79642 Pain in left hand: Secondary | ICD-10-CM | POA: Diagnosis not present

## 2017-01-06 DIAGNOSIS — Z9889 Other specified postprocedural states: Secondary | ICD-10-CM

## 2017-01-06 DIAGNOSIS — M0609 Rheumatoid arthritis without rheumatoid factor, multiple sites: Secondary | ICD-10-CM | POA: Diagnosis not present

## 2017-01-06 DIAGNOSIS — M79641 Pain in right hand: Secondary | ICD-10-CM | POA: Diagnosis not present

## 2017-01-06 DIAGNOSIS — Z79899 Other long term (current) drug therapy: Secondary | ICD-10-CM | POA: Diagnosis not present

## 2017-01-06 DIAGNOSIS — F17200 Nicotine dependence, unspecified, uncomplicated: Secondary | ICD-10-CM

## 2017-01-06 DIAGNOSIS — Z8659 Personal history of other mental and behavioral disorders: Secondary | ICD-10-CM

## 2017-01-06 DIAGNOSIS — Z8719 Personal history of other diseases of the digestive system: Secondary | ICD-10-CM

## 2017-01-06 DIAGNOSIS — M8589 Other specified disorders of bone density and structure, multiple sites: Secondary | ICD-10-CM

## 2017-01-06 DIAGNOSIS — Z8669 Personal history of other diseases of the nervous system and sense organs: Secondary | ICD-10-CM

## 2017-01-06 DIAGNOSIS — Z8639 Personal history of other endocrine, nutritional and metabolic disease: Secondary | ICD-10-CM

## 2017-01-06 LAB — CBC WITH DIFFERENTIAL/PLATELET
Basophils Absolute: 0 cells/uL (ref 0–200)
Basophils Relative: 0 %
Eosinophils Absolute: 102 cells/uL (ref 15–500)
Eosinophils Relative: 1 %
HCT: 34.1 % — ABNORMAL LOW (ref 35.0–45.0)
Hemoglobin: 11.5 g/dL — ABNORMAL LOW (ref 11.7–15.5)
Lymphocytes Relative: 13 %
Lymphs Abs: 1326 cells/uL (ref 850–3900)
MCH: 32 pg (ref 27.0–33.0)
MCHC: 33.7 g/dL (ref 32.0–36.0)
MCV: 95 fL (ref 80.0–100.0)
MPV: 9.6 fL (ref 7.5–12.5)
Monocytes Absolute: 408 cells/uL (ref 200–950)
Monocytes Relative: 4 %
Neutro Abs: 8364 cells/uL — ABNORMAL HIGH (ref 1500–7800)
Neutrophils Relative %: 82 %
Platelets: 295 10*3/uL (ref 140–400)
RBC: 3.59 MIL/uL — ABNORMAL LOW (ref 3.80–5.10)
RDW: 15 % (ref 11.0–15.0)
WBC: 10.2 10*3/uL (ref 3.8–10.8)

## 2017-01-06 LAB — COMPLETE METABOLIC PANEL WITH GFR
ALT: 16 U/L (ref 6–29)
AST: 15 U/L (ref 10–35)
Albumin: 4.5 g/dL (ref 3.6–5.1)
Alkaline Phosphatase: 120 U/L (ref 33–130)
BUN: 17 mg/dL (ref 7–25)
CO2: 23 mmol/L (ref 20–31)
Calcium: 9.2 mg/dL (ref 8.6–10.4)
Chloride: 100 mmol/L (ref 98–110)
Creat: 0.9 mg/dL (ref 0.60–0.93)
GFR, Est African American: 73 mL/min (ref >=60)
GFR, Est Non African American: 64 mL/min (ref >=60)
Glucose, Bld: 236 mg/dL — ABNORMAL HIGH (ref 65–99)
Potassium: 5.2 mmol/L (ref 3.5–5.3)
Sodium: 134 mmol/L — ABNORMAL LOW (ref 135–146)
Total Bilirubin: 0.9 mg/dL (ref 0.2–1.2)
Total Protein: 6.7 g/dL (ref 6.1–8.1)

## 2017-01-06 NOTE — Patient Instructions (Signed)
Taper prednisone take one half tablet for two weeks, then one half tablet every other day for two weeks, then you can stop prednisone   Standing Labs We placed an order today for your standing lab work.    Please come back and get your standing labs in three months   We have open lab Monday through Friday from 8:30-11:30 AM and 1:30-4 PM at the office of Dr. Bo Merino.   The office is located at [address] [place], [place], [place] [address] No appointment is necessary.   Labs are drawn by [facility].  You may receive a bill from [facility] for your lab work. If you have any questions regarding directions or hours of operation,  please call [phone].

## 2017-01-07 NOTE — Progress Notes (Signed)
Glu elevated

## 2017-01-21 ENCOUNTER — Telehealth: Payer: Self-pay

## 2017-01-21 NOTE — Telephone Encounter (Addendum)
Patient called wanting to let us know that she had a red circle on her upper right leg from where she self injected her Enbrel.  Stated that it's not that red, not itching, and not hot.  She noticed it on Wednesday, but it has stayed the same size since then, and this is the first time this has happened.    Patient would also would like to know would it be okay if she were to be a day late taking her Enbrel due to shipment not arriving until next Wednesday.  CB# [phone]. Please advise.

## 2017-01-21 NOTE — Telephone Encounter (Signed)
I spoke to patient regarding injection site reaction.  Patient reports she has mild pinkness around where she injected her Enbrel on Tuesday.  She reports the area is not raised.  It is not itchy or hard.    I discussed that injection site reactions occur with Enbrel.  Reviewed management strategies including Zyrtec and Zantac.  Patient voiced understanding.    Reviewed that more severe anaphylaxis reactions would require 911 and discontinuation of the medication.  Patient denies any other adverse effects except mild pinkness at injection site.    We also discussed Enbrel dose for next week.  Offered patient a sample if she would like to take her medication on Tuesday as scheduled or advised it was okay for her to wait until Wednesday for next next dose.  Patient reports she will wait until Wednesday.  Patient denies any further questions or concerns at this time.   Elisabeth Most, Pharm.D., BCPS, CPP Clinical Pharmacist Pager: [phone] Phone: [phone] 01/21/2017 12:23 PM

## 2017-01-27 ENCOUNTER — Telehealth (INDEPENDENT_AMBULATORY_CARE_PROVIDER_SITE_OTHER): Payer: Self-pay | Admitting: Orthopaedic Surgery

## 2017-01-27 NOTE — Telephone Encounter (Signed)
Plan of Care faxed 01/26/17

## 2017-02-09 ENCOUNTER — Other Ambulatory Visit: Payer: Self-pay | Admitting: Rheumatology

## 2017-02-09 NOTE — Telephone Encounter (Signed)
Last Visit: 01/06/17 Next Visit: 04/13/17 Labs: 01/06/17 Elevated glucose  Okay to refill per Dr. Estanislado Pandy

## 2017-03-02 ENCOUNTER — Telehealth: Payer: Self-pay | Admitting: Rheumatology

## 2017-03-02 NOTE — Telephone Encounter (Signed)
Please call in prednisone 5 mg by mouth daily. Schedule appointment to  discuss other treatment options.

## 2017-03-02 NOTE — Telephone Encounter (Signed)
Patient states she is having a flare. She came off of prednisone completely last week and started to flare. Patient would like to know what to do.

## 2017-03-02 NOTE — Telephone Encounter (Signed)
Patient states she is having pain in her right hand and is unable to use it. Patient states her wrist is swollen. Patient states she is having swelling in her fingers and is unable to make a fist. Patient states she is right hand dominant. Patient states she finished Prednisone about 2 weeks ago. Patient states she started to noticed the pain starting then. Patient is on Enbrel as well as MTX 0.8 mL weekly and taking it as prescribed. Please advise.

## 2017-03-03 NOTE — Telephone Encounter (Signed)
Patient advised of recommendation. Patient states she has Prednisone 5 mg at home and will take it daily. Patient has been scheduled for 03/15/17 at 2:30 pm.

## 2017-03-07 NOTE — Progress Notes (Signed)
Office Visit Note  Patient: Annette Suarez             Date of Birth: [birth year]           MRN: [id number]             PCP: Shirline Frees, MD Referring: Shirline Frees, MD Visit Date: 03/15/2017 Occupation: @GUAROCC @    Subjective:  Pain and swelling in hands.   History of Present Illness: Annette Suarez is a 76 y.o. female with history of sero negative rheumatoid arthritis. She's been on methotrexate and Enbrel combination for almost 3 months now. She has not noticed any improvement in her symptoms being on Enbrel. She continues to have pain and swelling in her bilateral hands. She has difficulty gripping objects and having significant morning stiffness. She had to start back on prednisone and she's currently taking 5 mg by mouth daily. She would like to taper prednisone as she is diabetic. Her husband accompanied her today. He is uncertain if she really had swelling and was only having pain.  Activities of Daily Living:  Patient reports morning stiffness for 1-2 hours.   Patient Reports nocturnal pain.  Difficulty dressing/grooming: Denies Difficulty climbing stairs: Denies Difficulty getting out of chair: Denies Difficulty using hands for taps, buttons, cutlery, and/or writing: Reports   Review of Systems  Constitutional: Positive for fatigue. Negative for night sweats, weight gain, weight loss and weakness.  HENT: Negative for mouth sores, trouble swallowing, trouble swallowing, mouth dryness and nose dryness.   Eyes: Negative for pain, redness, visual disturbance and dryness.  Respiratory: Negative for cough, shortness of breath and difficulty breathing.   Cardiovascular: Negative for chest pain, palpitations, hypertension, irregular heartbeat and swelling in legs/feet.  Gastrointestinal: Negative for blood in stool, constipation and diarrhea.  Endocrine: Negative for increased urination.  Genitourinary: Negative for vaginal dryness.  Musculoskeletal: Positive for  arthralgias, joint pain, joint swelling and morning stiffness. Negative for myalgias, muscle weakness, muscle tenderness and myalgias.  Skin: Negative for color change, rash, hair loss, skin tightness, ulcers and sensitivity to sunlight.  Allergic/Immunologic: Negative for susceptible to infections.  Neurological: Negative for dizziness, memory loss and night sweats.  Hematological: Negative for swollen glands.  Psychiatric/Behavioral: Positive for sleep disturbance. Negative for depressed mood. The patient is nervous/anxious.     PMFS History:  Patient Active Problem List   Diagnosis Date Noted  . Left carpal tunnel syndrome 11/15/2016  . Rheumatoid arthritis of multiple sites with negative rheumatoid factor ([place]) 10/06/2016  . High risk medication use 10/06/2016  . Dyslipidemia 10/06/2016  . Pain in both hands 09/08/2016  . Essential hypertension 09/08/2016  . Type 2 diabetes mellitus  09/08/2016  . Gastroesophageal reflux disease without esophagitis 09/08/2016  . Osteopenia of multiple sites 09/08/2016  . History of anxiety 09/08/2016  . Benign paroxysmal positional vertigo 09/08/2016  . Smoker 09/08/2016  . Sleep apnea 09/08/2016    History reviewed. No pertinent past medical history.  No family history on file. History reviewed. No pertinent surgical history. Social History   Social History Narrative  . No narrative on file     Objective: Vital Signs: BP 136/78   Pulse 78   Resp 14   Ht 5\' 1"  (1.549 m)   Wt 158 lb (71.7 kg)   BMI 29.85 kg/m    Physical Exam  Constitutional: She is oriented to person, place, and time. She appears well-developed and well-nourished.  HENT:  Head: Normocephalic and atraumatic.  Eyes: Conjunctivae and  EOM are normal.  Neck: Normal range of motion.  Cardiovascular: Normal rate, regular rhythm, normal heart sounds and intact distal pulses.   Pulmonary/Chest: Effort normal and breath sounds normal.  Abdominal: Soft. Bowel sounds are  normal.  Lymphadenopathy:    She has no cervical adenopathy.  Neurological: She is alert and oriented to person, place, and time.  Skin: Skin is warm and dry. Capillary refill takes less than 2 seconds.  Psychiatric: She has a normal mood and affect. Her behavior is normal.  Nursing note and vitals reviewed.    Musculoskeletal Exam: C-spine and thoracic lumbar spine good range of motion. Shoulder joints elbow joints wrist joints are good range of motion. She had tenderness on palpation of her wrist joints but no synovitis was noted. Minimal synovitis was noted over bilateral second MCP joints. She has severe DIP PIP thickening consistent with osteoarthritis. Hip joints knee joints ankles MTPs PIPs with good range of motion with no synovitis.  CDAI Exam: CDAI Homunculus Exam:   Tenderness:  RUE: wrist LUE: wrist Right hand: 2nd MCP Left hand: 2nd MCP  Swelling:  Right hand: 2nd MCP Left hand: 2nd MCP  Joint Counts:  CDAI Tender Joint count: 4 CDAI Swollen Joint count: 2  Global Assessments:  Patient Global Assessment: 8 Provider Global Assessment: 4  CDAI Calculated Score: 18    Investigation: No additional findings.TB Gold: Negative in 09/2016 CBC Latest Ref Rng & Units 01/06/2017 11/08/2016 10/25/2016  WBC 3.8 - 10.8 K/uL 10.2 12.1(H) 9.7  Hemoglobin 11.7 - 15.5 g/dL 11.5(L) 12.4 11.6(L)  Hematocrit 35.0 - 45.0 % 34.1(L) 36.3 35.0  Platelets 140 - 400 K/uL 295 376 314   CMP Latest Ref Rng & Units 01/06/2017 11/08/2016 10/25/2016  Glucose 65 - 99 mg/dL 236(H) 238(H) 215(H)  BUN 7 - 25 mg/dL 17 15 20   Creatinine 0.60 - 0.93 mg/dL 0.90 0.93 0.89  Sodium 135 - 146 mmol/L 134(L) 137 140  Potassium 3.5 - 5.3 mmol/L 5.2 4.5 4.2  Chloride 98 - 110 mmol/L 100 99 104  CO2 20 - 31 mmol/L 23 27 23   Calcium 8.6 - 10.4 mg/dL 9.2 9.3 8.9  Total Protein 6.1 - 8.1 g/dL 6.7 6.6 6.3  Total Bilirubin 0.2 - 1.2 mg/dL 0.9 0.9 0.9  Alkaline Phos 33 - 130 U/L 120 74 78  AST 10 - 35 U/L 15 14 11    ALT 6 - 29 U/L 16 13 11     Imaging: Dg Ribs Unilateral W/chest Right  Result Date: 03/08/2017 CLINICAL DATA:  Right lateral chest and rib pain for 2 months EXAM: RIGHT RIBS AND CHEST - 3+ VIEW COMPARISON:  09/08/2016 FINDINGS: Single-view chest demonstrates no acute consolidation or effusion. Normal cardiomediastinal silhouette with atherosclerosis. No pneumothorax. Surgical clips in the right upper quadrant. Right rib series demonstrates no acute displaced right rib fracture. IMPRESSION: 1. No radiographic evidence for acute cardiopulmonary abnormality 2. No acute displaced right rib fracture Electronically Signed   By: Donavan Foil M.D.   On: 03/08/2017 14:11    Speciality Comments: No specialty comments available.    Procedures:  No procedures performed Allergies: Latex   Assessment / Plan:     Visit Diagnoses: Rheumatoid arthritis of multiple sites with negative rheumatoid factor [facility]): Patient continues to have ongoing pain and discomfort. She gives history of a severe flare about 2 weeks ago requiring prednisone which she's taking 5 mg a day. She states she has not noticed much improvement so far. She believes that Enbrel is  not working for her. I do not see much synovitis on examination today. We had detailed discussion regarding this for over an hour. At this point she would like to taper off prednisone over the next 2 weeks. If she has a flare then she will switch to Humira. If her symptoms do not change and her symptoms are coming from osteoarthritis then she may continue on Enbrel. Informed consent was obtained and a handout on Humira was given. I will apply for Humira per her request. She understands that she will have to stop the Enbrel every week prior to starting Humira.  High risk medication use - MTX 0.8 ML subcutaneous every week, folic acid 2 mg by mouth daily, Enbrel 50 mg subcutaneous every week(12/20/16) prednisone 5 mg by mouth daily. - Plan: CBC with  Differential/Platelet, COMPLETE METABOLIC PANEL WITH GFR today and every 3 months.  Pain in both hands - She has moderate to severe osteoarthritis in her hands which is causing a lot of her symptoms. We had detailed discussion regarding that as well. Those symptoms are not going to  improve by use of DMARD's or Biologics.  S/P carpal tunnel release - left by Dr. Erlinda Hong 11/2016: Doing well  Osteopenia of multiple sites - Followed up by PCP  Smoker - Smoking cessation  History of hypertension: Her blood pressure is slightly elevated.  History of diabetes mellitus: I would prefer her to get off prednisone. I've advised to monitor blood glucose closely.  History of sleep apnea  History of anxiety: Contributes to a lot of her symptoms.  History of hyperlipidemia  History of gastroesophageal reflux (GERD)  OSA   Orders: No orders of the defined types were placed in this encounter.  No orders of the defined types were placed in this encounter.   Face-to-face time spent with patient was 60 minutes. Greater than 70 50% of time was spent in counseling and coordination of care.  Follow-Up Instructions: Return for Rheumatoid arthritis, Osteoarthritis.   Bo Merino, MD  Note - This record has been created using Editor, commissioning.  Chart creation errors have been sought, but may not always  have been located. Such creation errors do not reflect on  the standard of medical care.

## 2017-03-08 ENCOUNTER — Other Ambulatory Visit: Payer: Self-pay | Admitting: Family Medicine

## 2017-03-08 ENCOUNTER — Ambulatory Visit
Admission: RE | Admit: 2017-03-08 | Discharge: 2017-03-08 | Disposition: A | Discharge status: Home / Self Care | Payer: Medicare Other | Source: Ambulatory Visit | Attending: Family Medicine | Admitting: Family Medicine

## 2017-03-08 DIAGNOSIS — R0789 Other chest pain: Secondary | ICD-10-CM

## 2017-03-09 ENCOUNTER — Telehealth: Payer: Self-pay | Admitting: Rheumatology

## 2017-03-09 MED ORDER — ETANERCEPT 50 MG/ML ~~LOC~~ SOAJ: 50.0000 mg | SUBCUTANEOUS | 0 refills | Status: DC

## 2017-03-09 NOTE — Telephone Encounter (Signed)
Prescription faxed to [facility].

## 2017-03-09 NOTE — Telephone Encounter (Signed)
Patient requesting a refill called in on her [facility]. Patient uses Production assistant, radio.

## 2017-03-09 NOTE — Telephone Encounter (Signed)
Last Visit: 01/06/17 Next Visit: 04/13/17 Labs: 01/06/17 Elevated glucose TB Gold: 09/08/16 Neg   Okay to refill per Dr. Estanislado Pandy

## 2017-03-15 ENCOUNTER — Encounter: Payer: Self-pay | Admitting: Rheumatology

## 2017-03-15 ENCOUNTER — Ambulatory Visit (INDEPENDENT_AMBULATORY_CARE_PROVIDER_SITE_OTHER): Payer: Medicare Other | Admitting: Rheumatology

## 2017-03-15 ENCOUNTER — Telehealth: Payer: Self-pay | Admitting: Radiology

## 2017-03-15 VITALS — BP 136/78 | HR 78 | Resp 14 | Ht 61.0 in | Wt 158.0 lb

## 2017-03-15 DIAGNOSIS — Z9889 Other specified postprocedural states: Secondary | ICD-10-CM

## 2017-03-15 DIAGNOSIS — Z8659 Personal history of other mental and behavioral disorders: Secondary | ICD-10-CM | POA: Diagnosis not present

## 2017-03-15 DIAGNOSIS — F17200 Nicotine dependence, unspecified, uncomplicated: Secondary | ICD-10-CM | POA: Diagnosis not present

## 2017-03-15 DIAGNOSIS — Z8669 Personal history of other diseases of the nervous system and sense organs: Secondary | ICD-10-CM

## 2017-03-15 DIAGNOSIS — M79641 Pain in right hand: Secondary | ICD-10-CM

## 2017-03-15 DIAGNOSIS — Z8679 Personal history of other diseases of the circulatory system: Secondary | ICD-10-CM

## 2017-03-15 DIAGNOSIS — M0609 Rheumatoid arthritis without rheumatoid factor, multiple sites: Secondary | ICD-10-CM | POA: Diagnosis not present

## 2017-03-15 DIAGNOSIS — M8589 Other specified disorders of bone density and structure, multiple sites: Secondary | ICD-10-CM

## 2017-03-15 DIAGNOSIS — Z8719 Personal history of other diseases of the digestive system: Secondary | ICD-10-CM | POA: Diagnosis not present

## 2017-03-15 DIAGNOSIS — M79642 Pain in left hand: Secondary | ICD-10-CM

## 2017-03-15 DIAGNOSIS — Z79899 Other long term (current) drug therapy: Secondary | ICD-10-CM

## 2017-03-15 DIAGNOSIS — Z8639 Personal history of other endocrine, nutritional and metabolic disease: Secondary | ICD-10-CM | POA: Diagnosis not present

## 2017-03-15 NOTE — Telephone Encounter (Signed)
Enbrel not working  Dr Estanislado Pandy wants [place] to change to another treatment  Humira, Orencia, or Annette Suarez  Please check and see what would be covered best, she will also need patient assistance, so Humira may be best

## 2017-03-15 NOTE — Patient Instructions (Addendum)
Standing Labs We placed an order today for your standing lab work.    Please come back and get your standing labs in one month after starting Humira, then every 3 months.   We have open lab Monday through Friday from 8:30-11:30 AM and 1:30-4 PM at the office of Dr. Bo Merino.   The office is located at [address] [place], [place], [place] [address] No appointment is necessary.   Labs are drawn by [facility].  You may receive a bill from [facility] for your lab work. If you have any questions regarding directions or hours of operation,  please call [phone].      Adalimumab Injection What is this medicine? ADALIMUMAB (a dal AYE mu mab) is used to treat rheumatoid and psoriatic arthritis. It is also used to treat ankylosing spondylitis, Crohn's disease, ulcerative colitis, plaque psoriasis, hidradenitis suppurativa, and uveitis. This medicine may be used for other purposes; ask your health care provider or pharmacist if you have questions. COMMON BRAND NAME(S): CYLTEZO, Humira What should I tell my health care provider before I take this medicine? They need to know if you have any of these conditions: -diabetes -heart disease -hepatitis B or history of hepatitis B infection -immune system problems -infection or history of infections -multiple sclerosis -recently received or scheduled to receive a vaccine -scheduled to have surgery -tuberculosis, a positive skin test for tuberculosis or have recently been in close contact with someone who has tuberculosis -an unusual reaction to adalimumab, other medicines, mannitol, latex, rubber, foods, dyes, or preservatives -pregnant or trying to get pregnant -breast-feeding How should I use this medicine? This medicine is for injection under the skin. You will be taught how to prepare and give this medicine. Use exactly as directed. Take your medicine at regular intervals. Do not take your medicine more often than directed. A special  MedGuide will be given to you by the pharmacist with each prescription and refill. Be sure to read this information carefully each time. It is important that you put your used needles and syringes in a special sharps container. Do not put them in a trash can. If you do not have a sharps container, call your pharmacist or healthcare provider to get one. Talk to your pediatrician regarding the use of this medicine in children. While this drug may be prescribed for children as young as 2 years for selected conditions, precautions do apply. The manufacturer of the medicine offers free information to patients and their health care partners. Call [phone] for more information. Overdosage: If you think you have taken too much of this medicine contact a poison control center or emergency room at once. NOTE: This medicine is only for you. Do not share this medicine with others. What if I miss a dose? If you miss a dose, take it as soon as you can. If it is almost time for your next dose, take only that dose. Do not take double or extra doses. Give the next dose when your next scheduled dose is due. Call your doctor or health care professional if you are not sure how to handle a missed dose. What may interact with this medicine? Do not take this medicine with any of the following medications: -abatacept -anakinra -etanercept -infliximab -live virus vaccines -rilonacept This medicine may also interact with the following medications: -vaccines This list may not describe all possible interactions. Give your health care provider a list of all the medicines, herbs, non-prescription drugs, or dietary supplements you use. Also  tell them if you smoke, drink alcohol, or use illegal drugs. Some items may interact with your medicine. What should I watch for while using this medicine? Visit your doctor or health care professional for regular checks on your progress. Tell your doctor or healthcare professional if  your symptoms do not start to get better or if they get worse. You will be tested for tuberculosis (TB) before you start this medicine. If your doctor prescribes any medicine for TB, you should start taking the TB medicine before starting this medicine. Make sure to finish the full course of TB medicine. Call your doctor or health care professional if you get a cold or other infection while receiving this medicine. Do not treat yourself. This medicine may decrease your body's ability to fight infection. Talk to your doctor about your risk of cancer. You may be more at risk for certain types of cancers if you take this medicine. What side effects may I notice from receiving this medicine? Side effects that you should report to your doctor or health care professional as soon as possible: -allergic reactions like skin rash, itching or hives, swelling of the face, lips, or tongue -breathing problems -changes in vision -chest pain -fever, chills, or any other sign of infection -numbness or tingling -red, scaly patches or raised bumps on the skin -swelling of the ankles -swollen lymph nodes in the neck, underarm, or groin areas -unexplained weight loss -unusual bleeding or bruising -unusually weak or tired Side effects that usually do not require medical attention (report to your doctor or health care professional if they continue or are bothersome): -headache -nausea -redness, itching, swelling, or bruising at site where injected This list may not describe all possible side effects. Call your doctor for medical advice about side effects. You may report side effects to FDA at 1-800-FDA-1088. Where should I keep my medicine? Keep out of the reach of children. Store in the original container and in the refrigerator between 2 and 8 degrees C (36 and 46 degrees F). Do not freeze. The product may be stored in a cool carrier with an ice pack, if needed. Protect from light. Throw away any unused medicine  after the expiration date. NOTE: This sheet is a summary. It may not cover all possible information. If you have questions about this medicine, talk to your doctor, pharmacist, or health care provider.  2018 Elsevier/Gold Standard (2015-01-08 11:11:43)

## 2017-03-15 NOTE — Progress Notes (Signed)
Counseled patient on purpose, proper use, and adverse effects of Humira.  Reviewed the most common adverse effects including infections, headache, and injection site reactions.Reviewed the importance of regular labs while on Humira therapy.  Counseled patient that Humira should be held prior to scheduled surgery.  Counseled patient to avoid live vaccines while on Humira.  Advised patient to get annual influenza vaccine and the pneumococcal vaccine as indicated.  Provided patient with medication education material and answered all questions.  Patient consented to Humira.  Will upload consent into the media tab.  Reviewed storage instructions of Humira.  Advised initial injection must be administered in office.

## 2017-03-16 ENCOUNTER — Telehealth: Payer: Self-pay

## 2017-03-16 LAB — CBC WITH DIFFERENTIAL/PLATELET
Basophils Absolute: 87 cells/uL (ref 0–200)
Basophils Relative: 0.7 %
Eosinophils Absolute: 87 cells/uL (ref 15–500)
Eosinophils Relative: 0.7 %
HCT: 35.5 % (ref 35.0–45.0)
Hemoglobin: 12.1 g/dL (ref 11.7–15.5)
Lymphs Abs: 1401 cells/uL (ref 850–3900)
MCH: 31.8 pg (ref 27.0–33.0)
MCHC: 34.1 g/dL (ref 32.0–36.0)
MCV: 93.4 fL (ref 80.0–100.0)
MPV: 10.8 fL (ref 7.5–12.5)
Monocytes Relative: 3.6 %
Neutro Abs: 10379 cells/uL — ABNORMAL HIGH (ref 1500–7800)
Neutrophils Relative %: 83.7 %
Platelets: 346 10*3/uL (ref 140–400)
RBC: 3.8 10*6/uL (ref 3.80–5.10)
RDW: 13.6 % (ref 11.0–15.0)
Total Lymphocyte: 11.3 %
WBC mixed population: 446 cells/uL (ref 200–950)
WBC: 12.4 10*3/uL — ABNORMAL HIGH (ref 3.8–10.8)

## 2017-03-16 LAB — COMPLETE METABOLIC PANEL WITH GFR
AG Ratio: 1.7 (calc) (ref 1.0–2.5)
ALT: 14 U/L (ref 6–29)
AST: 16 U/L (ref 10–35)
Albumin: 4.4 g/dL (ref 3.6–5.1)
Alkaline phosphatase (APISO): 89 U/L (ref 33–130)
BUN/Creatinine Ratio: 18 (calc) (ref 6–22)
BUN: 19 mg/dL (ref 7–25)
CO2: 27 mmol/L (ref 20–32)
Calcium: 9.4 mg/dL (ref 8.6–10.4)
Chloride: 100 mmol/L (ref 98–110)
Creat: 1.06 mg/dL — ABNORMAL HIGH (ref 0.60–0.93)
GFR, Est African American: 60 mL/min/{1.73_m2} (ref >=60)
GFR, Est Non African American: 52 mL/min/{1.73_m2} — ABNORMAL LOW (ref >=60)
Globulin: 2.6 g/dL (calc) (ref 1.9–3.7)
Glucose, Bld: 229 mg/dL — ABNORMAL HIGH (ref 65–99)
Potassium: 4.9 mmol/L (ref 3.5–5.3)
Sodium: 136 mmol/L (ref 135–146)
Total Bilirubin: 1.1 mg/dL (ref 0.2–1.2)
Total Protein: 7 g/dL (ref 6.1–8.1)

## 2017-03-16 NOTE — Telephone Encounter (Signed)
Received a fax from [employer] regarding a prior authorization approval for HUMIRA from 03/16/17 to 07/04/17.   Reference number:[id number] Phone [phone]  Will send document to scan center.   Pheonix Wisby, [place], CPhT 10:53 AM

## 2017-03-16 NOTE — Telephone Encounter (Signed)
A prior authorization for Humira was submitted to pts insurance [employer]) via cover my meds. Will update once we receive a response.  Marleny Faller, [place], CPhT 10:07 AM

## 2017-03-16 NOTE — Telephone Encounter (Signed)
Completed prescriber portion and placed on her desk for signature

## 2017-03-16 NOTE — Telephone Encounter (Signed)
A prior authorization for HUMIRA was approved by patients insurance. (See notes)  Spoke to patient in office about [employer] Patient Assistance. Patient agreed to apply. Application was filled out and signed by patient. Will need the provider portion completed and signed before it can be submitted.    Lore Polka, [place], CPhT 11:01 AM

## 2017-03-16 NOTE — Progress Notes (Signed)
Repeat labs in 1 mth

## 2017-03-17 ENCOUNTER — Telehealth: Payer: Self-pay

## 2017-03-17 NOTE — Telephone Encounter (Signed)
Received a fax from [employer] stating that they received the pts application. They will complete the review and contact us with results.  Will update once we receive a response.  Haylyn Halberg, [place], CPhT 2:52 PM

## 2017-03-17 NOTE — Telephone Encounter (Signed)
Patients application for [employer] patient assistance was faxed to foundation. Will update once we receive a response.   Brick Ketcher, [place], CPhT 8:19 AM

## 2017-03-24 NOTE — Telephone Encounter (Signed)
Called to check the status of patients applications. Spoke with Annette Suarez who states that the patient is missing a copy of her medicare card and proof of income. Patient gave consent on application for program to use [employer] Report for proof of income. Annette Suarez states that the foundation has not yet implemented that program and documents would be required in order to process application. A call was made to the patient from the foundation [employer]) and a letter was sent out as well. Information can be faxed to [phone] with pt name and dob.   Called patient to update her. Had to leave a message.  Annette Suarez 1:40 PM

## 2017-03-25 ENCOUNTER — Telehealth: Payer: Self-pay | Admitting: Rheumatology

## 2017-03-25 NOTE — Telephone Encounter (Signed)
Patient left a message, returning your call.

## 2017-03-25 NOTE — Telephone Encounter (Signed)
Called patient to follow up on application. Gave her the update and informed her of the missing documents. Patient will bring documents to be faxed on Tuesday, September 25. Will fax documents and update once we receive a response.   Annette Suarez, [place], CPhT 4:11 PM

## 2017-03-31 ENCOUNTER — Other Ambulatory Visit: Payer: Self-pay | Admitting: Family Medicine

## 2017-03-31 ENCOUNTER — Ambulatory Visit
Admission: RE | Admit: 2017-03-31 | Discharge: 2017-03-31 | Disposition: A | Discharge status: Home / Self Care | Payer: Medicare Other | Source: Ambulatory Visit | Attending: Family Medicine | Admitting: Family Medicine

## 2017-03-31 DIAGNOSIS — M533 Sacrococcygeal disorders, not elsewhere classified: Secondary | ICD-10-CM

## 2017-04-01 NOTE — Telephone Encounter (Signed)
Patient did not show up with her documents like she said. Called her to follow up. She states that she has been sick and was unable to come out with the information. She will bring them with her to her next appointment on 10/10. We will submit the documents then.   Patient voices understanding and denies any questions at this time.   Zethan Alfieri, [place], CPhT 9:26 AM

## 2017-04-05 DIAGNOSIS — M19042 Primary osteoarthritis, left hand: Secondary | ICD-10-CM

## 2017-04-05 DIAGNOSIS — M19041 Primary osteoarthritis, right hand: Secondary | ICD-10-CM | POA: Insufficient documentation

## 2017-04-05 NOTE — Progress Notes (Signed)
Office Visit Note  Patient: Annette Suarez             Date of Birth: 29-Mar-1941           MRN: [id number]             PCP: Shirline Frees, MD Referring: Shirline Frees, MD Visit Date: 04/13/2017 Occupation: @GUAROCC @    Subjective:  Medication Management (feels okay except for her right hand, has increased activity has not changed to Humira yet, is using Enbrel )   History of Present Illness: Annette Suarez is a 76 y.o. female with history of rheumatoid arthritis and osteoarthritis overlap. She states about 2 weeks ago she fell and landed on her back. Her pelvis was hurting and she went to see Dr. Kenton Kingfisher. She states x-rays were unremarkable except for some arthritis. She was given 8 tablets of meloxicam which were helpful. She states her arthritis is doing better on Enbrel. At this once point she does not want to change to Humira. She has some discomfort in her wrists joints and MCP joints. She states the pain is tolerable. She does have a stiffness every morning. She taper off prednisone.    Activities of Daily Living:  Patient reports morning stiffness for left hand 30 minutes.   Patient Denies nocturnal pain.  Difficulty dressing/grooming: Denies Difficulty climbing stairs: Denies Difficulty getting out of chair: Denies Difficulty using hands for taps, buttons, cutlery, and/or writing: Reports   Review of Systems  Constitutional: Positive for fatigue. Negative for night sweats, weight gain, weight loss and weakness.  HENT: Negative.  Negative for mouth sores, trouble swallowing, trouble swallowing, mouth dryness and nose dryness.   Eyes: Negative.  Negative for pain, redness, visual disturbance and dryness.  Respiratory: Negative.  Negative for cough, shortness of breath and difficulty breathing.   Cardiovascular: Negative.  Negative for chest pain, palpitations, hypertension, irregular heartbeat and swelling in legs/feet.  Gastrointestinal: Positive for constipation. Negative  for abdominal pain, blood in stool and diarrhea.       Stools are black  Endocrine: Negative.  Negative for increased urination.  Genitourinary: Negative for nocturia and vaginal dryness.  Musculoskeletal: Positive for arthralgias, joint pain and morning stiffness. Negative for joint swelling, myalgias, muscle weakness, muscle tenderness and myalgias.       Right hand pain  Skin: Negative.  Negative for color change, rash, hair loss, skin tightness, ulcers and sensitivity to sunlight.  Allergic/Immunologic: Negative.  Negative for susceptible to infections.  Neurological: Negative.  Negative for dizziness, memory loss and night sweats.       [place] backwards couple weeks ago hurt back/ buttocks area   Hematological: Negative for swollen glands.  Psychiatric/Behavioral: Positive for depressed mood. Negative for suicidal ideas and sleep disturbance. The patient is nervous/anxious.     PMFS History:  Patient Active Problem List   Diagnosis Date Noted  . Primary osteoarthritis of both hands 04/05/2017  . Left carpal tunnel syndrome 11/15/2016  . Rheumatoid arthritis of multiple sites with negative rheumatoid factor ([place]) 10/06/2016  . High risk medication use 10/06/2016  . Dyslipidemia 10/06/2016  . Pain in both hands 09/08/2016  . Essential hypertension 09/08/2016  . Type 2 diabetes mellitus  09/08/2016  . Gastroesophageal reflux disease without esophagitis 09/08/2016  . Osteopenia of multiple sites 09/08/2016  . History of anxiety 09/08/2016  . Benign paroxysmal positional vertigo 09/08/2016  . Smoker 09/08/2016  . Sleep apnea 09/08/2016    No past medical history on file.  No family history on file. No past surgical history on file. Social History   Social History Narrative  . No narrative on file     Objective: Vital Signs: BP 136/74   Pulse 78   Resp 16   Ht 5\' 2"  (1.575 m)   Wt 156 lb (70.8 kg)   BMI 28.53 kg/m    Physical Exam  Constitutional: She is oriented to  person, place, and time. She appears well-developed and well-nourished.  HENT:  Head: Normocephalic and atraumatic.  Eyes: Conjunctivae and EOM are normal.  Neck: Normal range of motion.  Cardiovascular: Normal rate, regular rhythm, normal heart sounds and intact distal pulses.   Pulmonary/Chest: Effort normal and breath sounds normal.  Abdominal: Soft. Bowel sounds are normal.  Lymphadenopathy:    She has no cervical adenopathy.  Neurological: She is alert and oriented to person, place, and time.  Skin: Skin is warm and dry. Capillary refill takes less than 2 seconds.  Psychiatric: She has a normal mood and affect. Her behavior is normal.  Nursing note and vitals reviewed.    Musculoskeletal Exam: C-spine and thoracic lumbar spine good range of motion. Shoulder joints elbow joints wrist joints with good range of motion. She has some synovial thickening over her MCP joints. She has prominence of PIP/DIP joints. No synovitis was noted. Hip joints knee joints ankles MTPs PIPs with good range of motion with no synovitis.  CDAI Exam: CDAI Homunculus Exam:   Joint Counts:  CDAI Tender Joint count: 0 CDAI Swollen Joint count: 0  Global Assessments:  Patient Global Assessment: 2 Provider Global Assessment: 2  CDAI Calculated Score: 4    Investigation: No additional findings. CBC Latest Ref Rng & Units 03/15/2017 01/06/2017 11/08/2016  WBC 3.8 - 10.8 Thousand/uL 12.4(H) 10.2 12.1(H)  Hemoglobin 11.7 - 15.5 g/dL 12.1 11.5(L) 12.4  Hematocrit 35.0 - 45.0 % 35.5 34.1(L) 36.3  Platelets 140 - 400 Thousand/uL 346 295 376   CMP Latest Ref Rng & Units 03/15/2017 01/06/2017 11/08/2016  Glucose 65 - 99 mg/dL 229(H) 236(H) 238(H)  BUN 7 - 25 mg/dL 19 17 15   Creatinine 0.60 - 0.93 mg/dL 1.06(H) 0.90 0.93  Sodium 135 - 146 mmol/L 136 134(L) 137  Potassium 3.5 - 5.3 mmol/L 4.9 5.2 4.5  Chloride 98 - 110 mmol/L 100 100 99  CO2 20 - 32 mmol/L 27 23 27   Calcium 8.6 - 10.4 mg/dL 9.4 9.2 9.3  Total  Protein 6.1 - 8.1 g/dL 7.0 6.7 6.6  Total Bilirubin 0.2 - 1.2 mg/dL 1.1 0.9 0.9  Alkaline Phos 33 - 130 U/L - 120 74  AST 10 - 35 U/L 16 15 14   ALT 6 - 29 U/L 14 16 13     Imaging: Dg Lumbar Spine 2-3 Views  Result Date: 03/31/2017 CLINICAL DATA:  Status post fall ten days ago with persistent left posterior back and hip and pelvis pain. EXAM: LUMBAR SPINE - 2-3 VIEW COMPARISON:  None in PACs FINDINGS: The lumbar vertebral bodies are preserved in height. The pedicles and transverse processes are intact. The disc space heights are well maintained with exception of L5-S1 where there is moderate narrowing. There is facet joint hypertrophy from L3-4 inferiorly. There is no spondylolisthesis. The observed portions of the sacrum are normal. There is dense calcification in the wall of the abdominal aorta. IMPRESSION: There is no compression fracture or spondylolisthesis. There is moderate degenerative disc space narrowing at L5-S1. There is degenerative facet joint hypertrophy from L3 inferiorly. Abdominal aortic  atherosclerosis. Electronically Signed   By: David  Martinique M.D.   On: 03/31/2017 10:19   Dg Sacrum/coccyx  Result Date: 03/31/2017 CLINICAL DATA:  Left posterior back and hip and pelvis pain after a fall 10 days ago. EXAM: SACRUM AND COCCYX - 2+ VIEW COMPARISON:  None in PACs FINDINGS: The sacrum is subjectively adequately mineralized. Large phleboliths or uterine fibroids project over the left aspect of the body of the sacrum. The SI joints are grossly normal. On the lateral view the contour of the sacrum is normal. The sacrococcygeal junction exhibits mild angulation posteriorly. The presacral soft tissues are normal. IMPRESSION: There is no acute bony abnormality of the sacrum or coccyx. Minimal posterior orientation of the coccyx is most likely developmental. Electronically Signed   By: David  Martinique M.D.   On: 03/31/2017 10:20    Speciality Comments: No specialty comments  available.    Procedures:  No procedures performed Allergies: Latex   Assessment / Plan:     Visit Diagnoses: Rheumatoid arthritis of multiple sites with negative rheumatoid factor ([place]): She's been having some arthralgias but had no synovitis on examination. I believe she is doing quite well on current combination of medications. Her most recent lab shows elevation in her creatinine. I will reduce her methotrexate to 0.6 ML subcutaneous every week. Prescription refill was given today.  High risk medication use - Methotrexate 0.8 ML subcutaneous every week, folic acid 2 mg by mouth daily, Enbrel 50 mg subcutaneous every week. Labs have been stable except for increase in creatinine. She will need labs every 3 months to monitor for drug toxicity. Her TB gold which is a new on yearly basis is due in March.  Pain in both hands: Joint protection and muscle strengthening discussed.  Primary osteoarthritis of both hands  Osteopenia of multiple sites - Followed up by PCP. She's been taking calcium and vitamin D.  Her other medical problems are listed as follows:  History of gastroesophageal reflux (GERD)  History of hyperlipidemia  History of sleep apnea  History of anxiety  History of hypertension  History of diabetes mellitus    Orders: Orders Placed This Encounter  Procedures  . CBC with Differential/Platelet  . COMPLETE METABOLIC PANEL WITH GFR  . Quantiferon tb gold assay (blood)   Meds ordered this encounter  Medications  . methotrexate 50 MG/2ML injection    Sig: INJECT 0.6 ML (20 MG) INTO THE SKIN ONCE A WEEK.    Dispense:  10 mL    Refill:  0    Please dispense 2 mL vials with perseveratives.  . folic acid (FOLVITE) 1 MG tablet    Sig: Take 2 tablets (2 mg total) by mouth daily.    Dispense:  180 tablet    Refill:  4    Face-to-face time spent with patient was 30 minutes. Greater than 50% of time was spent in counseling and coordination of care.  Follow-Up  Instructions: Return in about 5 months (around 09/11/2017) for Rheumatoid arthritis, Osteoarthritis.   Bo Merino, MD  Note - This record has been created using Editor, commissioning.  Chart creation errors have been sought, but may not always  have been located. Such creation errors do not reflect on  the standard of medical care.

## 2017-04-13 ENCOUNTER — Encounter: Payer: Self-pay | Admitting: Rheumatology

## 2017-04-13 ENCOUNTER — Ambulatory Visit (INDEPENDENT_AMBULATORY_CARE_PROVIDER_SITE_OTHER): Payer: Medicare Other | Admitting: Rheumatology

## 2017-04-13 VITALS — BP 136/74 | HR 78 | Resp 16 | Ht 62.0 in | Wt 156.0 lb

## 2017-04-13 DIAGNOSIS — Z8669 Personal history of other diseases of the nervous system and sense organs: Secondary | ICD-10-CM | POA: Diagnosis not present

## 2017-04-13 DIAGNOSIS — M79642 Pain in left hand: Secondary | ICD-10-CM

## 2017-04-13 DIAGNOSIS — Z8659 Personal history of other mental and behavioral disorders: Secondary | ICD-10-CM | POA: Diagnosis not present

## 2017-04-13 DIAGNOSIS — M79641 Pain in right hand: Secondary | ICD-10-CM

## 2017-04-13 DIAGNOSIS — Z79899 Other long term (current) drug therapy: Secondary | ICD-10-CM | POA: Diagnosis not present

## 2017-04-13 DIAGNOSIS — M8589 Other specified disorders of bone density and structure, multiple sites: Secondary | ICD-10-CM | POA: Diagnosis not present

## 2017-04-13 DIAGNOSIS — M19041 Primary osteoarthritis, right hand: Secondary | ICD-10-CM | POA: Diagnosis not present

## 2017-04-13 DIAGNOSIS — M19042 Primary osteoarthritis, left hand: Secondary | ICD-10-CM | POA: Diagnosis not present

## 2017-04-13 DIAGNOSIS — Z8679 Personal history of other diseases of the circulatory system: Secondary | ICD-10-CM

## 2017-04-13 DIAGNOSIS — Z8639 Personal history of other endocrine, nutritional and metabolic disease: Secondary | ICD-10-CM | POA: Diagnosis not present

## 2017-04-13 DIAGNOSIS — M0609 Rheumatoid arthritis without rheumatoid factor, multiple sites: Secondary | ICD-10-CM | POA: Diagnosis not present

## 2017-04-13 DIAGNOSIS — Z8719 Personal history of other diseases of the digestive system: Secondary | ICD-10-CM | POA: Diagnosis not present

## 2017-04-13 MED ORDER — FOLIC ACID 1 MG PO TABS: 2.0000 mg | ORAL_TABLET | Freq: Every day | ORAL | 4 refills | Status: DC

## 2017-04-13 MED ORDER — METHOTREXATE SODIUM CHEMO INJECTION 50 MG/2ML: INTRAMUSCULAR | 0 refills | Status: DC

## 2017-04-13 NOTE — Patient Instructions (Addendum)
Standing Labs We placed an order today for your standing lab work.    Please come back and get your standing labs in January and every 3 months.   We have open lab Monday through Friday from 8:30-11:30 AM and 1:30-4 PM at the office of Dr. Bo Merino.   The office is located at [address], [place], [place], [place] [address] No appointment is necessary.   Labs are drawn by [facility].  You may receive a bill from [facility] for your lab work. If you have any questions regarding directions or hours of operation,  please call [phone].   Knee Exercises Ask your health care provider which exercises are safe for you. Do exercises exactly as told by your health care provider and adjust them as directed. It is normal to feel mild stretching, pulling, tightness, or discomfort as you do these exercises, but you should stop right away if you feel sudden pain or your pain gets worse.Do not begin these exercises until told by your health care provider. STRETCHING AND RANGE OF MOTION EXERCISES These exercises warm up your muscles and joints and improve the movement and flexibility of your knee. These exercises also help to relieve pain, numbness, and tingling. Exercise A: Knee Extension, Prone 1. Lie on your abdomen on a bed. 2. Place your left / right knee just beyond the edge of the surface so your knee is not on the bed. You can put a towel under your left / right thigh just above your knee for comfort. 3. Relax your leg muscles and allow gravity to straighten your knee. You should feel a stretch behind your left / right knee. 4. Hold this position for __________ seconds. 5. Scoot up so your knee is supported between repetitions. Repeat __________ times. Complete this stretch __________ times a day. Exercise B: Knee Flexion, Active  1. Lie on your back with both knees straight. If this causes back discomfort, bend your left / right knee so your foot is flat on the floor. 2. Slowly slide  your left / right heel back toward your buttocks until you feel a gentle stretch in the front of your knee or thigh. 3. Hold this position for __________ seconds. 4. Slowly slide your left / right heel back to the starting position. Repeat __________ times. Complete this exercise __________ times a day. Exercise C: Quadriceps, Prone  1. Lie on your abdomen on a firm surface, such as a bed or padded floor. 2. Bend your left / right knee and hold your ankle. If you cannot reach your ankle or pant leg, loop a belt around your foot and grab the belt instead. 3. Gently pull your heel toward your buttocks. Your knee should not slide out to the side. You should feel a stretch in the front of your thigh and knee. 4. Hold this position for __________ seconds. Repeat __________ times. Complete this stretch __________ times a day. Exercise D: Hamstring, Supine 1. Lie on your back. 2. Loop a belt or towel over the ball of your left / right foot. The ball of your foot is on the walking surface, right under your toes. 3. Straighten your left / right knee and slowly pull on the belt to raise your leg until you feel a gentle stretch behind your knee. ? Do not let your left / right knee bend while you do this. ? Keep your other leg flat on the floor. 4. Hold this position for __________ seconds. Repeat __________ times. Complete  this stretch __________ times a day. STRENGTHENING EXERCISES These exercises build strength and endurance in your knee. Endurance is the ability to use your muscles for a long time, even after they get tired. Exercise E: Quadriceps, Isometric  1. Lie on your back with your left / right leg extended and your other knee bent. Put a rolled towel or small pillow under your knee if told by your health care provider. 2. Slowly tense the muscles in the front of your left / right thigh. You should see your kneecap slide up toward your hip or see increased dimpling just above the knee. This  motion will push the back of the knee toward the floor. 3. For __________ seconds, keep the muscle as tight as you can without increasing your pain. 4. Relax the muscles slowly and completely. Repeat __________ times. Complete this exercise __________ times a day. Exercise F: Straight Leg Raises - Quadriceps 1. Lie on your back with your left / right leg extended and your other knee bent. 2. Tense the muscles in the front of your left / right thigh. You should see your kneecap slide up or see increased dimpling just above the knee. Your thigh may even shake a bit. 3. Keep these muscles tight as you raise your leg 4-6 inches (10-15 cm) off the floor. Do not let your knee bend. 4. Hold this position for __________ seconds. 5. Keep these muscles tense as you lower your leg. 6. Relax your muscles slowly and completely after each repetition. Repeat __________ times. Complete this exercise __________ times a day. Exercise G: Hamstring, Isometric 1. Lie on your back on a firm surface. 2. Bend your left / right knee approximately __________ degrees. 3. Dig your left / right heel into the surface as if you are trying to pull it toward your buttocks. Tighten the muscles in the back of your thighs to dig as hard as you can without increasing any pain. 4. Hold this position for __________ seconds. 5. Release the tension gradually and allow your muscles to relax completely for __________ seconds after each repetition. Repeat __________ times. Complete this exercise __________ times a day. Exercise H: Hamstring Curls  If told by your health care provider, do this exercise while wearing ankle weights. Begin with __________ weights. Then increase the weight by 1 lb (0.5 kg) increments. Do not wear ankle weights that are more than __________. 1. Lie on your abdomen with your legs straight. 2. Bend your left / right knee as far as you can without feeling pain. Keep your hips flat against the floor. 3. Hold  this position for __________ seconds. 4. Slowly lower your leg to the starting position.  Repeat __________ times. Complete this exercise __________ times a day. Exercise I: Squats (Quadriceps) 1. Stand in front of a table, with your feet and knees pointing straight ahead. You may rest your hands on the table for balance but not for support. 2. Slowly bend your knees and lower your hips like you are going to sit in a chair. ? Keep your weight over your heels, not over your toes. ? Keep your lower legs upright so they are parallel with the table legs. ? Do not let your hips go lower than your knees. ? Do not bend lower than told by your health care provider. ? If your knee pain increases, do not bend as low. 3. Hold the squat position for __________ seconds. 4. Slowly push with your legs to return to standing. Do  not use your hands to pull yourself to standing. Repeat __________ times. Complete this exercise __________ times a day. Exercise J: Wall Slides (Quadriceps)  1. Lean your back against a smooth wall or door while you walk your feet out 18-24 inches (46-61 cm) from it. 2. Place your feet hip-width apart. 3. Slowly slide down the wall or door until your knees bend __________ degrees. Keep your knees over your heels, not over your toes. Keep your knees in line with your hips. 4. Hold for __________ seconds. Repeat __________ times. Complete this exercise __________ times a day. Exercise K: Straight Leg Raises - Hip Abductors 1. Lie on your side with your left / right leg in the top position. Lie so your head, shoulder, knee, and hip line up. You may bend your bottom knee to help you keep your balance. 2. Roll your hips slightly forward so your hips are stacked directly over each other and your left / right knee is facing forward. 3. Leading with your heel, lift your top leg 4-6 inches (10-15 cm). You should feel the muscles in your outer hip lifting. ? Do not let your foot drift  forward. ? Do not let your knee roll toward the ceiling. 4. Hold this position for __________ seconds. 5. Slowly return your leg to the starting position. 6. Let your muscles relax completely after each repetition. Repeat __________ times. Complete this exercise __________ times a day. Exercise L: Straight Leg Raises - Hip Extensors 1. Lie on your abdomen on a firm surface. You can put a pillow under your hips if that is more comfortable. 2. Tense the muscles in your buttocks and lift your left / right leg about 4-6 inches (10-15 cm). Keep your knee straight as you lift your leg. 3. Hold this position for __________ seconds. 4. Slowly lower your leg to the starting position. 5. Let your leg relax completely after each repetition. Repeat __________ times. Complete this exercise __________ times a day. This information is not intended to replace advice given to you by your health care provider. Make sure you discuss any questions you have with your health care provider. Document Released: 05/05/2005 Document Revised: 03/15/2016 Document Reviewed: 04/27/2015 Elsevier Interactive Patient Education  2018 [employer].

## 2017-04-14 NOTE — Telephone Encounter (Signed)
*  See previous office visit note* Patient is not interested in starting HUMIRA at this time. Will hold off at this time.  Sreshta Cressler, [place], CPhT 10:37 AM

## 2017-05-05 ENCOUNTER — Telehealth: Payer: Self-pay

## 2017-05-05 NOTE — Telephone Encounter (Signed)
Patient would like a call back.  Has some questions.  Cb# is [phone].  Thank You.

## 2017-05-05 NOTE — Telephone Encounter (Signed)
Returned patient's call. She states that she received a letter from [employer] requesting a renewal application for [id number]. Patient plans to come in on 11/2 to complete and sign application. We will faxed application to foundation once provider portion is complete.   Prestina Raigoza, [place], CPhT 4:20 PM

## 2017-05-06 ENCOUNTER — Telehealth: Payer: Self-pay

## 2017-05-06 NOTE — Telephone Encounter (Signed)
Patient came by clinic with her financial documents for [employer] application. Application was completed and faxed to foundation. Will update once we receive a response.   Ambriella Kitt, [place], CPhT 1:45 PM

## 2017-05-24 ENCOUNTER — Telehealth: Payer: Self-pay

## 2017-05-24 NOTE — Telephone Encounter (Signed)
Called foundation to check the status of patient's application. Spoke to [place] who states that the application is pending a benefits investigation. Will update once we receive a response.   Annette Suarez, [place], CPhT 2:04 PM

## 2017-05-24 NOTE — Telephone Encounter (Signed)
Patient called to check the status of her [employer] patient assistance application for [id number]. Her application is still being reviewed but we should have an answer by the end of the year. She also states that she will need a refill on her medication before the end of the year. She has 3 pens left and it should last her until the first week of December. Please send a new Rx to [employer] for her.   Thanks!  Locklan Canoy, [place], CPhT 9:55 AM

## 2017-05-25 NOTE — Telephone Encounter (Signed)
Last Visit: 03/15/17 Next Visit: 09/12/17 Labs: 03/15/17 Glucose and WBC elevated, pt on Prednisone TB Gold: 09/08/16 Neg  Okay to refill per Dr. Estanislado Pandy

## 2017-07-07 ENCOUNTER — Telehealth: Payer: Self-pay

## 2017-07-07 NOTE — Telephone Encounter (Signed)
Received a fax from [employer] regarding a prior authorization approval for HUMIRA through 07/04/2018.   Reference [id number] Phone number:[phone]  Will send document to scan center.  Lakayla Barrington, [place], CPhT 2:44 PM

## 2017-07-07 NOTE — Telephone Encounter (Signed)
Received a prior authorization renewal request for Humira from cover my meds. Authorization was submitted to pts insurance via cover my meds. Will update once we receive a response.   Andy Moye, [place], CPhT 2:36 PM

## 2017-07-18 ENCOUNTER — Other Ambulatory Visit: Payer: Self-pay | Admitting: Rheumatology

## 2017-07-19 NOTE — Telephone Encounter (Signed)
Last Visit: 04/13/17 Next Visit: 09/12/17   Okay to refill per Dr. Estanislado Pandy

## 2017-07-20 ENCOUNTER — Telehealth: Payer: Self-pay

## 2017-07-20 ENCOUNTER — Other Ambulatory Visit: Payer: Self-pay

## 2017-07-20 DIAGNOSIS — Z79899 Other long term (current) drug therapy: Secondary | ICD-10-CM

## 2017-07-20 NOTE — Telephone Encounter (Signed)
[place] to check the status of pts application. Spoke with Daisa who states that the pt application is currently in the insurance verification stage (last stage). She could not give me a timeframe when we would have a response. She is hoping by the end of the month.   Called pt to update. No answer.  Vivan Vanderveer, [place], CPhT 1:46 PM

## 2017-07-22 LAB — CBC WITH DIFFERENTIAL/PLATELET
Basophils Absolute: 77 cells/uL (ref 0–200)
Basophils Relative: 1 %
Eosinophils Absolute: 239 cells/uL (ref 15–500)
Eosinophils Relative: 3.1 %
HCT: 36.2 % (ref 35.0–45.0)
Hemoglobin: 12.4 g/dL (ref 11.7–15.5)
Lymphs Abs: 1717 cells/uL (ref 850–3900)
MCH: 31.4 pg (ref 27.0–33.0)
MCHC: 34.3 g/dL (ref 32.0–36.0)
MCV: 91.6 fL (ref 80.0–100.0)
MPV: 10.3 fL (ref 7.5–12.5)
Monocytes Relative: 8.7 %
Neutro Abs: 4997 cells/uL (ref 1500–7800)
Neutrophils Relative %: 64.9 %
Platelets: 332 10*3/uL (ref 140–400)
RBC: 3.95 10*6/uL (ref 3.80–5.10)
RDW: 13.1 % (ref 11.0–15.0)
Total Lymphocyte: 22.3 %
WBC mixed population: 670 cells/uL (ref 200–950)
WBC: 7.7 10*3/uL (ref 3.8–10.8)

## 2017-07-22 LAB — COMPLETE METABOLIC PANEL WITH GFR
AG Ratio: 2.1 (calc) (ref 1.0–2.5)
ALT: 13 U/L (ref 6–29)
AST: 11 U/L (ref 10–35)
Albumin: 4.6 g/dL (ref 3.6–5.1)
Alkaline phosphatase (APISO): 102 U/L (ref 33–130)
BUN/Creatinine Ratio: 21 (calc) (ref 6–22)
BUN: 20 mg/dL (ref 7–25)
CO2: 27 mmol/L (ref 20–32)
Calcium: 9.7 mg/dL (ref 8.6–10.4)
Chloride: 101 mmol/L (ref 98–110)
Creat: 0.96 mg/dL — ABNORMAL HIGH (ref 0.60–0.93)
GFR, Est African American: 68 mL/min/{1.73_m2} (ref >=60)
GFR, Est Non African American: 58 mL/min/{1.73_m2} — ABNORMAL LOW (ref >=60)
Globulin: 2.2 g/dL (calc) (ref 1.9–3.7)
Glucose, Bld: 257 mg/dL — ABNORMAL HIGH (ref 65–99)
Potassium: 5.3 mmol/L (ref 3.5–5.3)
Sodium: 138 mmol/L (ref 135–146)
Total Bilirubin: 1.1 mg/dL (ref 0.2–1.2)
Total Protein: 6.8 g/dL (ref 6.1–8.1)

## 2017-07-22 LAB — QUANTIFERON-TB GOLD PLUS
Mitogen-NIL: 6.8 IU/mL
NIL: 0.03 IU/mL
QuantiFERON-TB Gold Plus: NEGATIVE
TB1-NIL: 0.01 IU/mL
TB2-NIL: 0 IU/mL

## 2017-07-22 NOTE — Telephone Encounter (Signed)
Received a fax from [employer] stating that pt has been approved to receive Enbrel free of charge from the foundation through 07/04/2018.   Called pt to update. Patient voices understanding and denies any questions at this time.  Will send documents to scan center  Demetrios Loll, CPhT 10:28 AM

## 2017-07-29 ENCOUNTER — Encounter: Payer: Self-pay | Admitting: *Deleted

## 2017-08-18 ENCOUNTER — Other Ambulatory Visit: Payer: Self-pay | Admitting: Rheumatology

## 2017-08-18 NOTE — Telephone Encounter (Signed)
Last Visit: 04/13/17 Next Visit: 09/12/17 Labs: 07/20/17 Elevated glucose   Okay to refill per Dr. Estanislado Pandy

## 2017-08-29 NOTE — Progress Notes (Signed)
Office Visit Note  Patient: Annette Suarez             Date of Birth: May 11, 1941           MRN: [id number]             PCP: Shirline Frees, MD Referring: Shirline Frees, MD Visit Date: 09/12/2017 Occupation: @GUAROCC @    Subjective:  Medication Management   History of Present Illness: Annette Suarez is a 77 y.o. female with history of rheumatoid arthritis and osteoarthritis.  Patient states that in January 2019 she developed pain and swelling in her right first MTP joint.  At the time she was seen by Dr. Moreen Fowler who evaluated her and checked uric acid.  She was diagnosed with gout and was placed on prednisone taper.  She has not taken any other medications since then.  Her rheumatoid arthritis is well controlled currently.  Patient was hospitalized on March 7 for the nausea and vomiting.  She was diagnosed with urinary tract infection and was treated with ciprofloxacin.  Activities of Daily Living:  Patient reports morning stiffness for 0 minute.   Patient Denies nocturnal pain.  Difficulty dressing/grooming: Denies Difficulty climbing stairs: Reports Difficulty getting out of chair: Reports Difficulty using hands for taps, buttons, cutlery, and/or writing: Denies   Review of Systems  Constitutional: Positive for fatigue. Negative for night sweats, weight gain, weight loss and weakness.  HENT: Positive for mouth dryness. Negative for mouth sores, trouble swallowing, trouble swallowing and nose dryness.   Eyes: Positive for dryness. Negative for pain, redness and visual disturbance.  Respiratory: Negative for cough, shortness of breath and difficulty breathing.   Cardiovascular: Negative for chest pain, palpitations, hypertension, irregular heartbeat and swelling in legs/feet.  Gastrointestinal: Negative for blood in stool, constipation and diarrhea.  Endocrine: Negative for increased urination.  Genitourinary: Negative for vaginal dryness.  Musculoskeletal: Negative for  arthralgias, joint pain, joint swelling, myalgias, muscle weakness, morning stiffness, muscle tenderness and myalgias.  Skin: Negative for color change, rash, hair loss, skin tightness, ulcers and sensitivity to sunlight.  Allergic/Immunologic: Negative for susceptible to infections.  Neurological: Negative for dizziness, memory loss and night sweats.  Hematological: Negative for swollen glands.  Psychiatric/Behavioral: Negative for depressed mood and sleep disturbance. The patient is nervous/anxious.     PMFS History:  Patient Active Problem List   Diagnosis Date Noted  . Dehydration 09/09/2017  . Acute lower UTI 09/09/2017  . Nausea & vomiting 09/08/2017  . Primary osteoarthritis of both hands 04/05/2017  . Left carpal tunnel syndrome 11/15/2016  . Rheumatoid arthritis of multiple sites with negative rheumatoid factor ([place]) 10/06/2016  . High risk medication use 10/06/2016  . Dyslipidemia 10/06/2016  . Pain in both hands 09/08/2016  . Essential hypertension 09/08/2016  . Type 2 diabetes mellitus  09/08/2016  . Gastroesophageal reflux disease without esophagitis 09/08/2016  . Osteopenia of multiple sites 09/08/2016  . History of anxiety 09/08/2016  . Benign paroxysmal positional vertigo 09/08/2016  . Smoker 09/08/2016  . Sleep apnea 09/08/2016    Past Medical History:  Diagnosis Date  . Diabetes mellitus without complication ([place])   . Goiter, nontoxic, multinodular   . Hypertension   . RA (rheumatoid arthritis) ([facility])     Family History  Problem Relation Age of Onset  . Hypertension Other   . Diabetes Mother   . Heart disease Father   . Diabetes Brother   . Cancer Sister        breast   .  Diabetes Son   . Crohn's disease Son    Past Surgical History:  Procedure Laterality Date  . CHOLECYSTECTOMY     Social History   Social History Narrative  . Not on file     Objective: Vital Signs: BP 121/78 (BP Location: Left Arm, Patient Position: Sitting, Cuff Size:  Normal)   Pulse 78   Resp 18   Ht 5\' 2"  (1.575 m)   Wt 161 lb (73 kg)   BMI 29.45 kg/m    Physical Exam  Constitutional: She is oriented to person, place, and time. She appears well-developed and well-nourished.  HENT:  Head: Normocephalic and atraumatic.  Eyes: Conjunctivae and EOM are normal.  Neck: Normal range of motion.  Cardiovascular: Normal rate, regular rhythm, normal heart sounds and intact distal pulses.  Pulmonary/Chest: Effort normal and breath sounds normal.  Abdominal: Soft. Bowel sounds are normal.  Lymphadenopathy:    She has no cervical adenopathy.  Neurological: She is alert and oriented to person, place, and time.  Skin: Skin is warm and dry. Capillary refill takes less than 2 seconds.  Psychiatric: She has a normal mood and affect. Her behavior is normal.  Nursing note and vitals reviewed.    Musculoskeletal Exam: C-spine thoracic lumbar spine good range of motion.  Shoulder joints elbow joints wrist joint MCPs PIPs [place] good range of motion.  She has mild MCP thickening.  Hip joints knee joints ankles MTPs PIPs are good range of motion with no synovitis.  CDAI Exam: CDAI Homunculus Exam:   Joint Counts:  CDAI Tender Joint count: 0 CDAI Swollen Joint count: 0  Global Assessments:  Patient Global Assessment: 1 Provider Global Assessment: 1  CDAI Calculated Score: 2    Investigation: No additional findings.TB Gold: 07/20/2017 Negative  CBC Latest Ref Rng & Units 09/09/2017 09/08/2017 09/08/2017  WBC 4.0 - 10.5 K/uL 9.5 - 15.2(H)  Hemoglobin 12.0 - 15.0 g/dL 10.8(L) 12.2 12.6  Hematocrit 36.0 - 46.0 % 31.8(L) 36.0 36.0  Platelets 150 - 400 K/uL 210 - 220   CMP Latest Ref Rng & Units 09/10/2017 09/09/2017 09/08/2017  Glucose 65 - 99 mg/dL 169(H) 245(H) 294(H)  BUN 6 - 20 mg/dL 15 18 17   Creatinine 0.44 - 1.00 mg/dL 0.98 0.98 0.80  Sodium 135 - 145 mmol/L 140 140 139  Potassium 3.5 - 5.1 mmol/L 3.7 3.5 3.9  Chloride 101 - 111 mmol/L 109 107 103  CO2 22 -  32 mmol/L 24 23 -  Calcium 8.9 - 10.3 mg/dL 9.1 7.6(L) -  Total Protein 6.5 - 8.1 g/dL - 5.7(L) -  Total Bilirubin 0.3 - 1.2 mg/dL - 1.5(H) -  Alkaline Phos 38 - 126 U/L - 56 -  AST 15 - 41 U/L - 35 -  ALT 14 - 54 U/L - 27 -    Imaging: Ct Abdomen Pelvis Wo Contrast  Result Date: 09/08/2017 CLINICAL DATA:  Nausea vomiting diarrhea EXAM: CT ABDOMEN AND PELVIS WITHOUT CONTRAST TECHNIQUE: Multidetector CT imaging of the abdomen and pelvis was performed following the standard protocol without IV contrast. COMPARISON:  None. FINDINGS: Lower chest: Lung bases demonstrate no acute consolidation or pleural effusion. Normal heart size. Hepatobiliary: No focal liver abnormality is seen. Status post cholecystectomy. No biliary dilatation. Pancreas: Unremarkable. No pancreatic ductal dilatation or surrounding inflammatory changes. Spleen: Normal in size without focal abnormality. Adrenals/Urinary Tract: Adrenal glands are within normal limits. No hydronephrosis. No ureteral stone. Nonspecific perinephric fat stranding. The bladder is within normal limits Stomach/Bowel: Stomach is  nonenlarged. No dilated small bowel. No colon wall thickening. Appendix not confidently identified but no right lower quadrant inflammation. Extensive sigmoid colon diverticula without acute disease. Vascular/Lymphatic: Moderate severe aortic atherosclerosis. No aneurysmal dilatation. No significantly enlarged lymph nodes. Reproductive: Status post hysterectomy. No adnexal masses. Other: Negative for free air or free fluid. Small fat in the left inguinal region. Mild hazy appearance of the central mesentery with small scattered lymph nodes measuring up to 5 mm. Musculoskeletal: Marked degenerative change at L5-S1. No acute or suspicious bone lesion. IMPRESSION: 1. No definite CT evidence for acute intra-abdominal or pelvic abnormality. Negative for bowel obstruction or bowel wall thickening. 2. Minimal haziness of the central mesentery with  a few small lymph nodes present, could be due to nonspecific mesenteric edema or possibly panniculitis. Electronically Signed   By: Donavan Foil M.D.   On: 09/08/2017 20:46   US Abdomen Limited  Result Date: 09/09/2017 CLINICAL DATA:  Right upper quadrant pain with nausea vomiting and diarrhea EXAM: ULTRASOUND ABDOMEN LIMITED RIGHT UPPER QUADRANT COMPARISON:  CT 09/08/2017 FINDINGS: Gallbladder: Surgically absent Common bile duct: Diameter: 5.6 mm Liver: No focal lesion identified. Within normal limits in parenchymal echogenicity. Portal vein is patent on color Doppler imaging with normal direction of blood flow towards the liver. IMPRESSION: 1. Surgical absence of the gallbladder.  No biliary dilatation 2. Otherwise negative right upper quadrant abdominal ultrasound Electronically Signed   By: Donavan Foil M.D.   On: 09/09/2017 18:01   Dg Abd Acute W/chest  Result Date: 09/08/2017 CLINICAL DATA:  Nausea, vomiting, diarrhea, hyperglycemia, diabetes, former smoking history EXAM: DG ABDOMEN ACUTE W/ 1V CHEST COMPARISON:  Chest x-ray 03/08/2017 FINDINGS: No active infiltrate or effusion is seen. Mediastinal hilar contours are unremarkable. The heart is mildly enlarged and stable. Supine and left lateral decubitus films of the abdomen show no bowel obstruction. No free air is seen. Rounded calcifications are noted to the left of midline of the pelvis. These possibly could be within the urinary bladder or within a urinary bladder diverticulum. These do not appear to be typical of phleboliths or calcified nodes there are degenerative changes present in the lower lumbar spine. IMPRESSION: 1. No active lung disease.  Stable mild cardiomegaly. 2. No bowel obstruction.  No free air. 3. Rounded calcifications in the left pelvis as noted above. CT of the abdomen pelvis may be helpful to localize if necessary. Electronically Signed   By: Ivar Drape M.D.   On: 09/08/2017 17:11    Speciality Comments: No specialty  comments available.    Procedures:  No procedures performed Allergies: Latex   Assessment / Plan:     Visit Diagnoses: Rheumatoid arthritis of multiple sites with negative rheumatoid factor [facility]): Patient has no active synovitis.  Her arthritis seems to be quite well controlled on current regimen.  I have advised her to hold Enbrel and methotrexate until she has repeat UA done.  She is on ciprofloxacin for recent urinary tract infection for which she was hospitalized.  We also discussed spacing Enbrel to every other week and  staying on methotrexate on a weekly basis.  High risk medication use - Enbrel 50 mg sq q wk, MTX 0.6 ml sq qwk, folic acid 1mg  po qd.  Her labs are stable.  She had mild elevation of creatinine probably secondary to dehydration.  Primary osteoarthritis of both hands: Some discomfort.  Right foot pain: She had an episode of increased right foot pain and was treated for possible gout.  She  was asymptomatic today.  I will obtain uric acid level today.  If her uric acid is elevated we may have to add colchicine.  She is also on thiazide diuretic.  Have advised to not to take the medication and discuss that further with her PCP.  Osteopenia of multiple sites - Followed up by PCP. She's been taking calcium and vitamin D.  Other medical problems are listed as follows:  History of diabetes mellitus  History of gastroesophageal reflux (GERD)  History of hypertension  History of hyperlipidemia  History of anxiety  History of sleep apnea    Orders: Orders Placed This Encounter  Procedures  . Uric acid   No orders of the defined types were placed in this encounter.   Face-to-face time spent with patient was 30 minutes.  Greater than 50% of time was spent in counseling and coordination of care.  Follow-Up Instructions: Return in about 3 months (around 12/13/2017) for Rheumatoid arthritis, Osteoarthritis.   Bo Merino, MD  Note - This record has been  created using Editor, commissioning.  Chart creation errors have been sought, but may not always  have been located. Such creation errors do not reflect on  the standard of medical care.

## 2017-09-08 ENCOUNTER — Emergency Department (HOSPITAL_COMMUNITY): Payer: Medicare Other

## 2017-09-08 ENCOUNTER — Other Ambulatory Visit: Payer: Self-pay

## 2017-09-08 ENCOUNTER — Encounter (HOSPITAL_COMMUNITY): Payer: Self-pay | Admitting: *Deleted

## 2017-09-08 ENCOUNTER — Observation Stay (HOSPITAL_COMMUNITY)
Admission: EM | Admit: 2017-09-08 | Discharge: 2017-09-10 | Disposition: A | Discharge status: Home / Self Care | Payer: Medicare Other | Attending: Internal Medicine | Admitting: Internal Medicine

## 2017-09-08 DIAGNOSIS — Z9049 Acquired absence of other specified parts of digestive tract: Secondary | ICD-10-CM | POA: Insufficient documentation

## 2017-09-08 DIAGNOSIS — E860 Dehydration: Secondary | ICD-10-CM | POA: Diagnosis not present

## 2017-09-08 DIAGNOSIS — Z79899 Other long term (current) drug therapy: Secondary | ICD-10-CM | POA: Diagnosis not present

## 2017-09-08 DIAGNOSIS — Z794 Long term (current) use of insulin: Secondary | ICD-10-CM | POA: Diagnosis not present

## 2017-09-08 DIAGNOSIS — Z7982 Long term (current) use of aspirin: Secondary | ICD-10-CM | POA: Diagnosis not present

## 2017-09-08 DIAGNOSIS — R112 Nausea with vomiting, unspecified: Secondary | ICD-10-CM

## 2017-09-08 DIAGNOSIS — I10 Essential (primary) hypertension: Secondary | ICD-10-CM | POA: Insufficient documentation

## 2017-09-08 DIAGNOSIS — M069 Rheumatoid arthritis, unspecified: Secondary | ICD-10-CM | POA: Diagnosis not present

## 2017-09-08 DIAGNOSIS — E118 Type 2 diabetes mellitus with unspecified complications: Secondary | ICD-10-CM

## 2017-09-08 DIAGNOSIS — E1165 Type 2 diabetes mellitus with hyperglycemia: Secondary | ICD-10-CM | POA: Insufficient documentation

## 2017-09-08 DIAGNOSIS — E785 Hyperlipidemia, unspecified: Secondary | ICD-10-CM | POA: Diagnosis not present

## 2017-09-08 DIAGNOSIS — M0609 Rheumatoid arthritis without rheumatoid factor, multiple sites: Secondary | ICD-10-CM | POA: Diagnosis present

## 2017-09-08 DIAGNOSIS — E1159 Type 2 diabetes mellitus with other circulatory complications: Secondary | ICD-10-CM | POA: Diagnosis present

## 2017-09-08 DIAGNOSIS — I700 Atherosclerosis of aorta: Secondary | ICD-10-CM | POA: Diagnosis not present

## 2017-09-08 DIAGNOSIS — Z90710 Acquired absence of both cervix and uterus: Secondary | ICD-10-CM | POA: Insufficient documentation

## 2017-09-08 DIAGNOSIS — K5730 Diverticulosis of large intestine without perforation or abscess without bleeding: Secondary | ICD-10-CM | POA: Insufficient documentation

## 2017-09-08 DIAGNOSIS — E119 Type 2 diabetes mellitus without complications: Secondary | ICD-10-CM | POA: Diagnosis present

## 2017-09-08 DIAGNOSIS — N390 Urinary tract infection, site not specified: Secondary | ICD-10-CM | POA: Diagnosis present

## 2017-09-08 DIAGNOSIS — N3000 Acute cystitis without hematuria: Secondary | ICD-10-CM | POA: Diagnosis not present

## 2017-09-08 DIAGNOSIS — G4730 Sleep apnea, unspecified: Secondary | ICD-10-CM | POA: Diagnosis not present

## 2017-09-08 DIAGNOSIS — E042 Nontoxic multinodular goiter: Secondary | ICD-10-CM | POA: Diagnosis not present

## 2017-09-08 DIAGNOSIS — R1011 Right upper quadrant pain: Secondary | ICD-10-CM

## 2017-09-08 DIAGNOSIS — I152 Hypertension secondary to endocrine disorders: Secondary | ICD-10-CM | POA: Diagnosis present

## 2017-09-08 DIAGNOSIS — Z87891 Personal history of nicotine dependence: Secondary | ICD-10-CM | POA: Diagnosis not present

## 2017-09-08 HISTORY — DX: Nausea with vomiting, unspecified: R11.2

## 2017-09-08 HISTORY — DX: Rheumatoid arthritis, unspecified: M06.9

## 2017-09-08 HISTORY — DX: Essential (primary) hypertension: I10

## 2017-09-08 HISTORY — DX: Type 2 diabetes mellitus without complications: E11.9

## 2017-09-08 HISTORY — DX: Nontoxic multinodular goiter: E04.2

## 2017-09-08 LAB — I-STAT CHEM 8, ED
BUN: 17 mg/dL (ref 6–20)
Calcium, Ion: 0.99 mmol/L — ABNORMAL LOW (ref 1.15–1.40)
Chloride: 103 mmol/L (ref 101–111)
Creatinine, Ser: 0.8 mg/dL (ref 0.44–1.00)
Glucose, Bld: 294 mg/dL — ABNORMAL HIGH (ref 65–99)
HCT: 36 % (ref 36.0–46.0)
Hemoglobin: 12.2 g/dL (ref 12.0–15.0)
Potassium: 3.9 mmol/L (ref 3.5–5.1)
Sodium: 139 mmol/L (ref 135–145)
TCO2: 21 mmol/L — ABNORMAL LOW (ref 22–32)

## 2017-09-08 LAB — COMPREHENSIVE METABOLIC PANEL
ALT: 17 U/L (ref 14–54)
AST: 30 U/L (ref 15–41)
Albumin: 3.9 g/dL (ref 3.5–5.0)
Alkaline Phosphatase: 77 U/L (ref 38–126)
Anion gap: 12 (ref 5–15)
BUN: 18 mg/dL (ref 6–20)
CO2: 23 mmol/L (ref 22–32)
Calcium: 8.1 mg/dL — ABNORMAL LOW (ref 8.9–10.3)
Chloride: 109 mmol/L (ref 101–111)
Creatinine, Ser: 0.97 mg/dL (ref 0.44–1.00)
GFR calc Af Amer: >60 mL/min (ref >60)
GFR calc non Af Amer: 56 mL/min — ABNORMAL LOW (ref >60)
Glucose, Bld: 300 mg/dL — ABNORMAL HIGH (ref 65–99)
Potassium: 4.2 mmol/L (ref 3.5–5.1)
Sodium: 144 mmol/L (ref 135–145)
Total Bilirubin: 1.6 mg/dL — ABNORMAL HIGH (ref 0.3–1.2)
Total Protein: 6.4 g/dL — ABNORMAL LOW (ref 6.5–8.1)

## 2017-09-08 LAB — URINALYSIS, ROUTINE W REFLEX MICROSCOPIC
Bilirubin Urine: NEGATIVE
Glucose, UA: >=500 mg/dL — AB
Ketones, ur: 20 mg/dL — AB
Leukocytes, UA: NEGATIVE
Nitrite: POSITIVE — AB
Protein, ur: NEGATIVE mg/dL
Specific Gravity, Urine: 1.012 (ref 1.005–1.030)
pH: 5 (ref 5.0–8.0)

## 2017-09-08 LAB — CBC WITH DIFFERENTIAL/PLATELET
Basophils Absolute: 0 10*3/uL (ref 0.0–0.1)
Basophils Relative: 0 %
Eosinophils Absolute: 0 10*3/uL (ref 0.0–0.7)
Eosinophils Relative: 0 %
HCT: 36 % (ref 36.0–46.0)
Hemoglobin: 12.6 g/dL (ref 12.0–15.0)
Lymphocytes Relative: 2 %
Lymphs Abs: 0.3 10*3/uL — ABNORMAL LOW (ref 0.7–4.0)
MCH: 32.6 pg (ref 26.0–34.0)
MCHC: 35 g/dL (ref 30.0–36.0)
MCV: 93.3 fL (ref 78.0–100.0)
Monocytes Absolute: 0.2 10*3/uL (ref 0.1–1.0)
Monocytes Relative: 2 %
Neutro Abs: 14.6 10*3/uL — ABNORMAL HIGH (ref 1.7–7.7)
Neutrophils Relative %: 96 %
Platelets: 220 10*3/uL (ref 150–400)
RBC: 3.86 MIL/uL — ABNORMAL LOW (ref 3.87–5.11)
RDW: 13.7 % (ref 11.5–15.5)
WBC: 15.2 10*3/uL — ABNORMAL HIGH (ref 4.0–10.5)

## 2017-09-08 LAB — CBG MONITORING, ED
Glucose-Capillary: 293 mg/dL — ABNORMAL HIGH (ref 65–99)
Glucose-Capillary: 203 mg/dL — ABNORMAL HIGH (ref 65–99)

## 2017-09-08 MED ORDER — MECLIZINE HCL 25 MG PO TABS: 25.0000 mg | ORAL_TABLET | Freq: Three times a day (TID) | ORAL | 0 refills | Status: DC | PRN

## 2017-09-08 MED ORDER — CIPROFLOXACIN HCL 500 MG PO TABS: 500.0000 mg | ORAL_TABLET | Freq: Two times a day (BID) | ORAL | 0 refills | Status: DC

## 2017-09-08 MED ORDER — ONDANSETRON 4 MG PO TBDP: ORAL_TABLET | ORAL | 0 refills | Status: DC

## 2017-09-08 MED ORDER — METOCLOPRAMIDE HCL 5 MG/ML IJ SOLN
10.0000 mg | Freq: Once | INTRAMUSCULAR | Status: AC
  Administered 2017-09-08: 10 mg via INTRAVENOUS
  Filled 2017-09-08: qty 2

## 2017-09-08 MED ORDER — ONDANSETRON HCL 4 MG/2ML IJ SOLN
4.0000 mg | Freq: Once | INTRAMUSCULAR | Status: AC
  Administered 2017-09-08: 4 mg via INTRAVENOUS
  Filled 2017-09-08: qty 2

## 2017-09-08 MED ORDER — MECLIZINE HCL 25 MG PO TABS
25.0000 mg | ORAL_TABLET | Freq: Once | ORAL | Status: AC
  Administered 2017-09-08: 25 mg via ORAL
  Filled 2017-09-08: qty 1

## 2017-09-08 MED ORDER — CIPROFLOXACIN HCL 500 MG PO TABS
500.0000 mg | ORAL_TABLET | Freq: Once | ORAL | Status: AC
  Administered 2017-09-08: 500 mg via ORAL
  Filled 2017-09-08: qty 1

## 2017-09-08 MED ORDER — IOPAMIDOL (ISOVUE-300) INJECTION 61%
INTRAVENOUS | Status: AC
  Filled 2017-09-08: qty 30

## 2017-09-08 MED ORDER — SODIUM CHLORIDE 0.9 % IV BOLUS (SEPSIS)
1000.0000 mL | Freq: Once | INTRAVENOUS | Status: AC
  Administered 2017-09-08: 1000 mL via INTRAVENOUS

## 2017-09-08 MED ORDER — SODIUM CHLORIDE 0.9 % IV BOLUS (SEPSIS)
500.0000 mL | Freq: Once | INTRAVENOUS | Status: AC
  Administered 2017-09-08: 500 mL via INTRAVENOUS

## 2017-09-08 NOTE — ED Notes (Signed)
Patient transported to CT 

## 2017-09-08 NOTE — Discharge Instructions (Signed)
Drink plenty of fluids.  Follow-up with your doctor next week for recheck.  Return here sooner if getting worse

## 2017-09-08 NOTE — ED Provider Notes (Addendum)
[facility] DEPT Provider Note   CSN: [id number] Arrival date & time: 09/08/17  1500     History   Chief Complaint Chief Complaint  Patient presents with  . Emesis  . Hyperglycemia    HPI Annette Suarez is a 77 y.o. female.  Patient complains of vomiting today multiple times she did have one episode of diarrhea yesterday and she also complains of vertigo symptoms when she moves around   The history is provided by the patient. No language interpreter was used.  Emesis   This is a new problem. The current episode started 12 to 24 hours ago. The problem occurs 2 to 4 times per day. The problem has not changed since onset.The emesis has an appearance of stomach contents. There has been no fever. Pertinent negatives include no abdominal pain, no cough, no diarrhea and no headaches.  Hyperglycemia  Associated symptoms: dizziness and vomiting   Associated symptoms: no abdominal pain, no chest pain and no fatigue     Past Medical History:  Diagnosis Date  . Diabetes mellitus without complication ([place])   . Goiter, nontoxic, multinodular   . Hypertension   . RA (rheumatoid arthritis) [facility])     Patient Active Problem List   Diagnosis Date Noted  . Primary osteoarthritis of both hands 04/05/2017  . Left carpal tunnel syndrome 11/15/2016  . Rheumatoid arthritis of multiple sites with negative rheumatoid factor ([place]) 10/06/2016  . High risk medication use 10/06/2016  . Dyslipidemia 10/06/2016  . Pain in both hands 09/08/2016  . Essential hypertension 09/08/2016  . Type 2 diabetes mellitus  09/08/2016  . Gastroesophageal reflux disease without esophagitis 09/08/2016  . Osteopenia of multiple sites 09/08/2016  . History of anxiety 09/08/2016  . Benign paroxysmal positional vertigo 09/08/2016  . Smoker 09/08/2016  . Sleep apnea 09/08/2016    Past Surgical History:  Procedure Laterality Date  . CHOLECYSTECTOMY      OB History    No data  available       Home Medications    Prior to Admission medications   Medication Sig Start Date End Date Taking? Authorizing Provider  acetaminophen (TYLENOL 8 HOUR) 650 MG CR tablet Take 650 mg by mouth every 8 (eight) hours as needed for pain.   Yes [provider]  aspirin EC 81 MG tablet Take 81 mg by mouth daily.   Yes [provider]  etanercept (ENBREL SURECLICK) 50 MG/ML injection Inject 0.98 mLs (50 mg total) into the skin once a week. 03/09/17  Yes Deveshwar, Abel Presto, MD  folic acid (FOLVITE) 1 MG tablet Take 2 tablets (2 mg total) by mouth daily. 04/13/17  Yes Deveshwar, Abel Presto, MD  insulin aspart protamine- aspart (NOVOLOG MIX 70/30) (70-30) 100 UNIT/ML injection Inject 30 Units into the skin 2 (two) times daily with a meal.   Yes [provider]  irbesartan (AVAPRO) 300 MG tablet  08/29/16  Yes [provider]  LORazepam (ATIVAN) 0.5 MG tablet Take 0.5 mg by mouth at bedtime as needed for anxiety.  08/13/16  Yes [provider]  metFORMIN (GLUCOPHAGE-XR) 500 MG 24 hr tablet Take 500 mg by mouth daily.  08/30/16  Yes [provider]  methotrexate 50 MG/2ML injection INJECT 0.6 MILLILITERS INTO THE SKIN ONCE WEEKLY 08/18/17  Yes Deveshwar, Abel Presto, MD  omeprazole (PRILOSEC) 40 MG capsule Take 40 mg by mouth daily.  11/10/16  Yes [provider]  simvastatin (ZOCOR) 20 MG tablet Take 20 mg by mouth daily.  08/29/16  Yes [provider]  ciprofloxacin (CIPRO) 500 MG tablet Take 1 tablet (500 mg total) by mouth 2 (two) times daily. 09/08/17   Milton Ferguson, MD  hydrochlorothiazide (HYDRODIURIL) 12.5 MG tablet Take 12.5 mg by mouth daily.  03/14/17   [provider]  meclizine (ANTIVERT) 25 MG tablet Take 1 tablet (25 mg total) by mouth 3 (three) times daily as needed for dizziness. 09/08/17   Milton Ferguson, MD  ondansetron (ZOFRAN ODT) 4 MG disintegrating tablet 4mg  ODT q4 hours prn nausea/vomit 09/08/17   Milton Ferguson,  MD  TUBERCULIN SYR 1CC/27GX1/2" (B-D TB SYRINGE 1CC/27GX1/2") 27G X 1/2" 1 ML MISC USE WEEKLY TO INJECT METHOTREXATE 07/19/17   Bo Merino, MD    Family History No family history on file.  Social History Social History   Tobacco Use  . Smoking status: Former Smoker    Last attempt to quit: 07/05/1978    Years since quitting: 39.2  . Smokeless tobacco: Never Used  Substance Use Topics  . Alcohol use: No  . Drug use: Not on file     Allergies   Latex   Review of Systems Review of Systems  Constitutional: Negative for appetite change and fatigue.  HENT: Negative for congestion, ear discharge and sinus pressure.   Eyes: Negative for discharge.  Respiratory: Negative for cough.   Cardiovascular: Negative for chest pain.  Gastrointestinal: Positive for vomiting. Negative for abdominal pain and diarrhea.  Genitourinary: Negative for frequency and hematuria.  Musculoskeletal: Negative for back pain.  Skin: Negative for rash.  Neurological: Positive for dizziness. Negative for seizures and headaches.  Psychiatric/Behavioral: Negative for hallucinations.     Physical Exam Updated Vital Signs BP 128/73   Pulse (!) 113   Temp 98.3 F (36.8 C) (Oral)   Resp (!) 26   Ht 5\' 2"  (1.575 m)   Wt 72.1 kg (159 lb)   SpO2 94%   BMI 29.08 kg/m   Physical Exam  Constitutional: She is oriented to person, place, and time. She appears well-developed.  HENT:  Head: Normocephalic.  Dry mucous membranes  Eyes: Conjunctivae and EOM are normal. No scleral icterus.  Neck: Neck supple. No thyromegaly present.  Cardiovascular: Exam reveals no gallop and no friction rub.  No murmur heard. Mild tachycardia regular rhythm  Pulmonary/Chest: No stridor. She has no wheezes. She has no rales. She exhibits no tenderness.  Abdominal: She exhibits no distension. There is no tenderness. There is no rebound.  Musculoskeletal: Normal range of motion. She exhibits no edema.  Lymphadenopathy:     She has no cervical adenopathy.  Neurological: She is oriented to person, place, and time. She exhibits normal muscle tone. Coordination normal.  Skin: No rash noted. No erythema.  Psychiatric: She has a normal mood and affect. Her behavior is normal.     ED Treatments / Results  Labs (all labs ordered are listed, but only abnormal results are displayed) Labs Reviewed  CBC WITH DIFFERENTIAL/PLATELET - Abnormal; Notable for the following components:      Result Value   WBC 15.2 (*)    RBC 3.86 (*)    Neutro Abs 14.6 (*)    Lymphs Abs 0.3 (*)    All other components within normal limits  COMPREHENSIVE METABOLIC PANEL - Abnormal; Notable for the following components:   Glucose, Bld 300 (*)    Calcium 8.1 (*)    Total Protein 6.4 (*)    Total Bilirubin 1.6 (*)    GFR calc non  Af Amer 56 (*)    All other components within normal limits  URINALYSIS, ROUTINE W REFLEX MICROSCOPIC - Abnormal; Notable for the following components:   Glucose, UA >=500 (*)    Hgb urine dipstick MODERATE (*)    Ketones, ur 20 (*)    Nitrite POSITIVE (*)    Bacteria, UA MANY (*)    Squamous Epithelial / LPF 0-5 (*)    All other components within normal limits  CBG MONITORING, ED - Abnormal; Notable for the following components:   Glucose-Capillary 293 (*)    All other components within normal limits  I-STAT CHEM 8, ED - Abnormal; Notable for the following components:   Glucose, Bld 294 (*)    Calcium, Ion 0.99 (*)    TCO2 21 (*)    All other components within normal limits  CBG MONITORING, ED - Abnormal; Notable for the following components:   Glucose-Capillary 203 (*)    All other components within normal limits    EKG  EKG Interpretation None       Radiology Ct Abdomen Pelvis Wo Contrast  Result Date: 09/08/2017 CLINICAL DATA:  Nausea vomiting diarrhea EXAM: CT ABDOMEN AND PELVIS WITHOUT CONTRAST TECHNIQUE: Multidetector CT imaging of the abdomen and pelvis was performed following the  standard protocol without IV contrast. COMPARISON:  None. FINDINGS: Lower chest: Lung bases demonstrate no acute consolidation or pleural effusion. Normal heart size. Hepatobiliary: No focal liver abnormality is seen. Status post cholecystectomy. No biliary dilatation. Pancreas: Unremarkable. No pancreatic ductal dilatation or surrounding inflammatory changes. Spleen: Normal in size without focal abnormality. Adrenals/Urinary Tract: Adrenal glands are within normal limits. No hydronephrosis. No ureteral stone. Nonspecific perinephric fat stranding. The bladder is within normal limits Stomach/Bowel: Stomach is nonenlarged. No dilated small bowel. No colon wall thickening. Appendix not confidently identified but no right lower quadrant inflammation. Extensive sigmoid colon diverticula without acute disease. Vascular/Lymphatic: Moderate severe aortic atherosclerosis. No aneurysmal dilatation. No significantly enlarged lymph nodes. Reproductive: Status post hysterectomy. No adnexal masses. Other: Negative for free air or free fluid. Small fat in the left inguinal region. Mild hazy appearance of the central mesentery with small scattered lymph nodes measuring up to 5 mm. Musculoskeletal: Marked degenerative change at L5-S1. No acute or suspicious bone lesion. IMPRESSION: 1. No definite CT evidence for acute intra-abdominal or pelvic abnormality. Negative for bowel obstruction or bowel wall thickening. 2. Minimal haziness of the central mesentery with a few small lymph nodes present, could be due to nonspecific mesenteric edema or possibly panniculitis. Electronically Signed   By: Donavan Foil M.D.   On: 09/08/2017 20:46   Dg Abd Acute W/chest  Result Date: 09/08/2017 CLINICAL DATA:  Nausea, vomiting, diarrhea, hyperglycemia, diabetes, former smoking history EXAM: DG ABDOMEN ACUTE W/ 1V CHEST COMPARISON:  Chest x-ray 03/08/2017 FINDINGS: No active infiltrate or effusion is seen. Mediastinal hilar contours are  unremarkable. The heart is mildly enlarged and stable. Supine and left lateral decubitus films of the abdomen show no bowel obstruction. No free air is seen. Rounded calcifications are noted to the left of midline of the pelvis. These possibly could be within the urinary bladder or within a urinary bladder diverticulum. These do not appear to be typical of phleboliths or calcified nodes there are degenerative changes present in the lower lumbar spine. IMPRESSION: 1. No active lung disease.  Stable mild cardiomegaly. 2. No bowel obstruction.  No free air. 3. Rounded calcifications in the left pelvis as noted above. CT of the abdomen pelvis may  be helpful to localize if necessary. Electronically Signed   By: Ivar Drape M.D.   On: 09/08/2017 17:11    Procedures Procedures (including critical care time)  Medications Ordered in ED Medications  iopamidol (ISOVUE-300) 61 % injection (not administered)  ciprofloxacin (CIPRO) tablet 500 mg (not administered)  metoCLOPramide (REGLAN) injection 10 mg (not administered)  sodium chloride 0.9 % bolus 1,000 mL (0 mLs Intravenous Stopped 09/08/17 1709)  ondansetron (ZOFRAN) injection 4 mg (4 mg Intravenous Given 09/08/17 1555)  sodium chloride 0.9 % bolus 500 mL (0 mLs Intravenous Stopped 09/08/17 2019)  meclizine (ANTIVERT) tablet 25 mg (25 mg Oral Given 09/08/17 2047)     Initial Impression / Assessment and Plan / ED Course  I have reviewed the triage vital signs and the nursing notes.  Pertinent labs & imaging results that were available during my care of the patient were reviewed by me and considered in my medical decision making (see chart for details).     Patient's labs including CBC chemistries urinalysis.  They show sugar elevation possible urinary tract infection, mild elevated white count.  CT scan unremarkable.  Patient will be treated for vertigo with Antivert.  She is also given Zofran and Cipro for possible urinary tract infection.  Patient was  offered admission to the hospital for the nausea vomiting but she did not want to be admitted.  Patient was told to follow-up with her doctor next week and definitely return here sooner if having any problems When the patient sat up to try to leave she got dizzy again nauseated and she will be admitted to the hospital for vomiting Final Clinical Impressions(s) / ED Diagnoses   Final diagnoses:  Dehydration    ED Discharge Orders        Ordered    ciprofloxacin (CIPRO) 500 MG tablet  2 times daily     09/08/17 2217    ondansetron (ZOFRAN ODT) 4 MG disintegrating tablet     09/08/17 2217    meclizine (ANTIVERT) 25 MG tablet  3 times daily PRN     09/08/17 2217       Milton Ferguson, MD 09/08/17 2221    Milton Ferguson, MD 09/08/17 2241

## 2017-09-08 NOTE — ED Notes (Signed)
Family at bedside. 

## 2017-09-08 NOTE — ED Triage Notes (Signed)
Per EMS, pt from home, reports n/v/d, and hyperglycemia at 373.  Denies abd pain.  She is diabetic.  sxs onset at 0600 this am with several emesis.  ? Change in LOC.  Denies any urinary sxs.  Pt is mildly hypertensive, but has not taken her meds d/t vomiting.

## 2017-09-09 ENCOUNTER — Observation Stay (HOSPITAL_COMMUNITY): Payer: Medicare Other

## 2017-09-09 ENCOUNTER — Encounter (HOSPITAL_COMMUNITY): Payer: Self-pay

## 2017-09-09 ENCOUNTER — Other Ambulatory Visit: Payer: Self-pay

## 2017-09-09 DIAGNOSIS — M0609 Rheumatoid arthritis without rheumatoid factor, multiple sites: Secondary | ICD-10-CM

## 2017-09-09 DIAGNOSIS — R112 Nausea with vomiting, unspecified: Secondary | ICD-10-CM

## 2017-09-09 DIAGNOSIS — N390 Urinary tract infection, site not specified: Secondary | ICD-10-CM

## 2017-09-09 DIAGNOSIS — I10 Essential (primary) hypertension: Secondary | ICD-10-CM | POA: Diagnosis not present

## 2017-09-09 DIAGNOSIS — E860 Dehydration: Secondary | ICD-10-CM

## 2017-09-09 LAB — GLUCOSE, CAPILLARY
Glucose-Capillary: 226 mg/dL — ABNORMAL HIGH (ref 65–99)
Glucose-Capillary: 212 mg/dL — ABNORMAL HIGH (ref 65–99)
Glucose-Capillary: 168 mg/dL — ABNORMAL HIGH (ref 65–99)
Glucose-Capillary: 121 mg/dL — ABNORMAL HIGH (ref 65–99)
Glucose-Capillary: 159 mg/dL — ABNORMAL HIGH (ref 65–99)

## 2017-09-09 LAB — HEPATIC FUNCTION PANEL
ALT: 27 U/L (ref 14–54)
AST: 35 U/L (ref 15–41)
Albumin: 3.2 g/dL — ABNORMAL LOW (ref 3.5–5.0)
Alkaline Phosphatase: 56 U/L (ref 38–126)
Bilirubin, Direct: 0.3 mg/dL (ref 0.1–0.5)
Indirect Bilirubin: 1.2 mg/dL — ABNORMAL HIGH (ref 0.3–0.9)
Total Bilirubin: 1.5 mg/dL — ABNORMAL HIGH (ref 0.3–1.2)
Total Protein: 5.7 g/dL — ABNORMAL LOW (ref 6.5–8.1)

## 2017-09-09 LAB — BASIC METABOLIC PANEL
Anion gap: 10 (ref 5–15)
BUN: 18 mg/dL (ref 6–20)
CO2: 23 mmol/L (ref 22–32)
Calcium: 7.6 mg/dL — ABNORMAL LOW (ref 8.9–10.3)
Chloride: 107 mmol/L (ref 101–111)
Creatinine, Ser: 0.98 mg/dL (ref 0.44–1.00)
GFR calc Af Amer: >60 mL/min (ref >60)
GFR calc non Af Amer: 55 mL/min — ABNORMAL LOW (ref >60)
Glucose, Bld: 245 mg/dL — ABNORMAL HIGH (ref 65–99)
Potassium: 3.5 mmol/L (ref 3.5–5.1)
Sodium: 140 mmol/L (ref 135–145)

## 2017-09-09 LAB — CBC
HCT: 31.8 % — ABNORMAL LOW (ref 36.0–46.0)
Hemoglobin: 10.8 g/dL — ABNORMAL LOW (ref 12.0–15.0)
MCH: 32.3 pg (ref 26.0–34.0)
MCHC: 34 g/dL (ref 30.0–36.0)
MCV: 95.2 fL (ref 78.0–100.0)
Platelets: 210 10*3/uL (ref 150–400)
RBC: 3.34 MIL/uL — ABNORMAL LOW (ref 3.87–5.11)
RDW: 14.1 % (ref 11.5–15.5)
WBC: 9.5 10*3/uL (ref 4.0–10.5)

## 2017-09-09 LAB — MAGNESIUM: Magnesium: 1.2 mg/dL — ABNORMAL LOW (ref 1.7–2.4)

## 2017-09-09 MED ORDER — SODIUM CHLORIDE 0.9 % IV SOLN
1.5000 mg/kg/h | Freq: Once | INTRAVENOUS | Status: AC
  Administered 2017-09-09: 1.5 mg/kg/h via INTRAVENOUS
  Filled 2017-09-09: qty 100

## 2017-09-09 MED ORDER — ACETAMINOPHEN 325 MG PO TABS
650.0000 mg | ORAL_TABLET | Freq: Four times a day (QID) | ORAL | Status: DC | PRN
  Administered 2017-09-09: 650 mg via ORAL
  Filled 2017-09-09: qty 2

## 2017-09-09 MED ORDER — IRBESARTAN 300 MG PO TABS
300.0000 mg | ORAL_TABLET | Freq: Every day | ORAL | Status: DC
  Administered 2017-09-09 – 2017-09-10 (×2): 300 mg via ORAL
  Filled 2017-09-09 (×2): qty 1

## 2017-09-09 MED ORDER — INSULIN ASPART 100 UNIT/ML ~~LOC~~ SOLN
0.0000 [IU] | Freq: Three times a day (TID) | SUBCUTANEOUS | Status: DC
  Administered 2017-09-09: 1 [IU] via SUBCUTANEOUS
  Administered 2017-09-09: 3 [IU] via SUBCUTANEOUS
  Administered 2017-09-09 – 2017-09-10 (×2): 2 [IU] via SUBCUTANEOUS

## 2017-09-09 MED ORDER — ASPIRIN EC 81 MG PO TBEC
81.0000 mg | DELAYED_RELEASE_TABLET | Freq: Every day | ORAL | Status: DC
  Administered 2017-09-09 – 2017-09-10 (×2): 81 mg via ORAL
  Filled 2017-09-09 (×2): qty 1

## 2017-09-09 MED ORDER — INSULIN GLARGINE 100 UNIT/ML ~~LOC~~ SOLN
10.0000 [IU] | Freq: Every day | SUBCUTANEOUS | Status: DC
  Administered 2017-09-09 – 2017-09-10 (×2): 10 [IU] via SUBCUTANEOUS
  Filled 2017-09-09 (×2): qty 0.1

## 2017-09-09 MED ORDER — PANTOPRAZOLE SODIUM 40 MG PO TBEC
40.0000 mg | DELAYED_RELEASE_TABLET | Freq: Every day | ORAL | Status: DC
  Administered 2017-09-09: 40 mg via ORAL
  Filled 2017-09-09: qty 1

## 2017-09-09 MED ORDER — ONDANSETRON HCL 4 MG PO TABS: 4.0000 mg | ORAL_TABLET | Freq: Four times a day (QID) | ORAL | Status: DC | PRN

## 2017-09-09 MED ORDER — CIPROFLOXACIN HCL 500 MG PO TABS
500.0000 mg | ORAL_TABLET | Freq: Two times a day (BID) | ORAL | Status: DC
  Administered 2017-09-09 – 2017-09-10 (×3): 500 mg via ORAL
  Filled 2017-09-09 (×3): qty 1

## 2017-09-09 MED ORDER — SODIUM CHLORIDE 0.9 % IV BOLUS (SEPSIS)
500.0000 mL | Freq: Once | INTRAVENOUS | Status: AC
  Administered 2017-09-09: 500 mL via INTRAVENOUS

## 2017-09-09 MED ORDER — FOLIC ACID 1 MG PO TABS
2.0000 mg | ORAL_TABLET | Freq: Every day | ORAL | Status: DC
  Administered 2017-09-09 – 2017-09-10 (×2): 2 mg via ORAL
  Filled 2017-09-09 (×2): qty 2

## 2017-09-09 MED ORDER — ENOXAPARIN SODIUM 40 MG/0.4ML ~~LOC~~ SOLN
40.0000 mg | SUBCUTANEOUS | Status: DC
  Administered 2017-09-09 – 2017-09-10 (×2): 40 mg via SUBCUTANEOUS
  Filled 2017-09-09 (×2): qty 0.4

## 2017-09-09 MED ORDER — GLUCERNA SHAKE PO LIQD
237.0000 mL | Freq: Once | ORAL | Status: AC
  Administered 2017-09-09: 237 mL via ORAL
  Filled 2017-09-09: qty 237

## 2017-09-09 MED ORDER — LORAZEPAM 0.5 MG PO TABS: 0.5000 mg | ORAL_TABLET | Freq: Every evening | ORAL | Status: DC | PRN

## 2017-09-09 MED ORDER — SIMVASTATIN 20 MG PO TABS
20.0000 mg | ORAL_TABLET | Freq: Every day | ORAL | Status: DC
  Administered 2017-09-09 – 2017-09-10 (×2): 20 mg via ORAL
  Filled 2017-09-09 (×2): qty 1

## 2017-09-09 MED ORDER — METOCLOPRAMIDE HCL 5 MG/ML IJ SOLN
10.0000 mg | Freq: Three times a day (TID) | INTRAMUSCULAR | Status: DC
  Administered 2017-09-09 – 2017-09-10 (×4): 10 mg via INTRAVENOUS
  Filled 2017-09-09 (×4): qty 2

## 2017-09-09 MED ORDER — SODIUM CHLORIDE 0.9 % IV SOLN
INTRAVENOUS | Status: AC
  Administered 2017-09-09 (×2): via INTRAVENOUS

## 2017-09-09 MED ORDER — ACETAMINOPHEN 650 MG RE SUPP: 650.0000 mg | Freq: Four times a day (QID) | RECTAL | Status: DC | PRN

## 2017-09-09 MED ORDER — RISAQUAD PO CAPS
1.0000 | ORAL_CAPSULE | Freq: Three times a day (TID) | ORAL | Status: DC
  Administered 2017-09-09 – 2017-09-10 (×3): 1 via ORAL
  Filled 2017-09-09 (×3): qty 1

## 2017-09-09 MED ORDER — ONDANSETRON HCL 4 MG/2ML IJ SOLN: 4.0000 mg | Freq: Four times a day (QID) | INTRAMUSCULAR | Status: DC | PRN

## 2017-09-09 NOTE — ED Notes (Signed)
ED TO INPATIENT HANDOFF REPORT  Name/Age/Gender Annette Suarez 77 y.o. female  Code Status Code Status History    This patient does not have a recorded code status. Please follow your organizational policy for patients in this situation.      Home/SNF/Other Home  Chief Complaint Nausea; Vomiting; Diarrhea; Hyperglycemia; Tachacardia  Level of Care/Admitting Diagnosis ED Disposition    ED Disposition Condition Comment   Admit  Hospital Area: [facility] [[id number]]  Level of Care: Telemetry [5]  Admit to tele based on following criteria: Monitor QTC interval  Diagnosis: Nausea & vomiting [295188]  Admitting Physician: Rise Patience [phone]  Attending Physician: Rise Patience [phone]  PT Class (Do Not Modify): Observation [104]  PT Acc Code (Do Not Modify): Observation [10022]       Medical History Past Medical History:  Diagnosis Date  . Diabetes mellitus without complication ([place])   . Goiter, nontoxic, multinodular   . Hypertension   . RA (rheumatoid arthritis) ([facility])     Allergies Allergies  Allergen Reactions  . Latex     IV Location/Drains/Wounds Patient Lines/Drains/Airways Status   Active Line/Drains/Airways    Name:   Placement date:   Placement time:   Site:   Days:   Peripheral IV 09/08/17 Left Hand   09/08/17    1538    Hand   1   Peripheral IV 09/08/17 Left;Posterior;Lateral Forearm   09/08/17    1557    Forearm   1          Labs/Imaging Results for orders placed or performed during the hospital encounter of 09/08/17 (from the past 48 hour(s))  CBG monitoring, ED     Status: Abnormal   Collection Time: 09/08/17  3:27 PM  Result Value Ref Range   Glucose-Capillary 293 (H) 65 - 99 mg/dL  CBC with Differential/Platelet     Status: Abnormal   Collection Time: 09/08/17  3:57 PM  Result Value Ref Range   WBC 15.2 (H) 4.0 - 10.5 K/uL   RBC 3.86 (L) 3.87 - 5.11 MIL/uL   Hemoglobin 12.6 12.0 - 15.0 g/dL   HCT 36.0 36.0  - 46.0 %   MCV 93.3 78.0 - 100.0 fL   MCH 32.6 26.0 - 34.0 pg   MCHC 35.0 30.0 - 36.0 g/dL   RDW 13.7 11.5 - 15.5 %   Platelets 220 150 - 400 K/uL   Neutrophils Relative % 96 %   Neutro Abs 14.6 (H) 1.7 - 7.7 K/uL   Lymphocytes Relative 2 %   Lymphs Abs 0.3 (L) 0.7 - 4.0 K/uL   Monocytes Relative 2 %   Monocytes Absolute 0.2 0.1 - 1.0 K/uL   Eosinophils Relative 0 %   Eosinophils Absolute 0.0 0.0 - 0.7 K/uL   Basophils Relative 0 %   Basophils Absolute 0.0 0.0 - 0.1 K/uL    Comment: Performed at [facility], [place] [address], [place], [place] [address]  Comprehensive metabolic panel     Status: Abnormal   Collection Time: 09/08/17  3:57 PM  Result Value Ref Range   Sodium 144 135 - 145 mmol/L   Potassium 4.2 3.5 - 5.1 mmol/L   Chloride 109 101 - 111 mmol/L   CO2 23 22 - 32 mmol/L   Glucose, Bld 300 (H) 65 - 99 mg/dL   BUN 18 6 - 20 mg/dL   Creatinine, Ser 0.97 0.44 - 1.00 mg/dL   Calcium 8.1 (L) 8.9 - 10.3  mg/dL   Total Protein 6.4 (L) 6.5 - 8.1 g/dL   Albumin 3.9 3.5 - 5.0 g/dL   AST 30 15 - 41 U/L   ALT 17 14 - 54 U/L   Alkaline Phosphatase 77 38 - 126 U/L   Total Bilirubin 1.6 (H) 0.3 - 1.2 mg/dL   GFR calc non Af Amer 56 (L) >60 mL/min   GFR calc Af Amer >60 >60 mL/min    Comment: (NOTE) The eGFR has been calculated using the CKD EPI equation. This calculation has not been validated in all clinical situations. eGFR's persistently <60 mL/min signify possible Chronic Kidney Disease.    Anion gap 12 5 - 15    Comment: Performed at [facility], [place] [address] [place], [place] [address]  I-stat chem 8, ed     Status: Abnormal   Collection Time: 09/08/17  4:04 PM  Result Value Ref Range   Sodium 139 135 - 145 mmol/L   Potassium 3.9 3.5 - 5.1 mmol/L   Chloride 103 101 - 111 mmol/L   BUN 17 6 - 20 mg/dL   Creatinine, Ser 0.80 0.44 - 1.00 mg/dL   Glucose, Bld 294 (H) 65 - 99 mg/dL   Calcium, Ion 0.99 (L) 1.15 - 1.40 mmol/L   TCO2  21 (L) 22 - 32 mmol/L   Hemoglobin 12.2 12.0 - 15.0 g/dL   HCT 36.0 36.0 - 46.0 %  Urinalysis, Routine w reflex microscopic     Status: Abnormal   Collection Time: 09/08/17  5:59 PM  Result Value Ref Range   Color, Urine YELLOW YELLOW   APPearance CLEAR CLEAR   Specific Gravity, Urine 1.012 1.005 - 1.030   pH 5.0 5.0 - 8.0   Glucose, UA >=500 (A) NEGATIVE mg/dL   Hgb urine dipstick MODERATE (A) NEGATIVE   Bilirubin Urine NEGATIVE NEGATIVE   Ketones, ur 20 (A) NEGATIVE mg/dL   Protein, ur NEGATIVE NEGATIVE mg/dL   Nitrite POSITIVE (A) NEGATIVE   Leukocytes, UA NEGATIVE NEGATIVE   RBC / HPF 0-5 0 - 5 RBC/hpf   WBC, UA 0-5 0 - 5 WBC/hpf   Bacteria, UA MANY (A) NONE SEEN   Squamous Epithelial / LPF 0-5 (A) NONE SEEN    Comment: Performed at [facility], [place] [address] [place], [place] [address]  CBG monitoring, ED     Status: Abnormal   Collection Time: 09/08/17  8:43 PM  Result Value Ref Range   Glucose-Capillary 203 (H) 65 - 99 mg/dL   Ct Abdomen Pelvis Wo Contrast  Result Date: 09/08/2017 CLINICAL DATA:  Nausea vomiting diarrhea EXAM: CT ABDOMEN AND PELVIS WITHOUT CONTRAST TECHNIQUE: Multidetector CT imaging of the abdomen and pelvis was performed following the standard protocol without IV contrast. COMPARISON:  None. FINDINGS: Lower chest: Lung bases demonstrate no acute consolidation or pleural effusion. Normal heart size. Hepatobiliary: No focal liver abnormality is seen. Status post cholecystectomy. No biliary dilatation. Pancreas: Unremarkable. No pancreatic ductal dilatation or surrounding inflammatory changes. Spleen: Normal in size without focal abnormality. Adrenals/Urinary Tract: Adrenal glands are within normal limits. No hydronephrosis. No ureteral stone. Nonspecific perinephric fat stranding. The bladder is within normal limits Stomach/Bowel: Stomach is nonenlarged. No dilated small bowel. No colon wall thickening. Appendix not confidently identified but  no right lower quadrant inflammation. Extensive sigmoid colon diverticula without acute disease. Vascular/Lymphatic: Moderate severe aortic atherosclerosis. No aneurysmal dilatation. No significantly enlarged lymph nodes. Reproductive: Status post hysterectomy. No adnexal masses. Other: Negative for free air  or free fluid. Small fat in the left inguinal region. Mild hazy appearance of the central mesentery with small scattered lymph nodes measuring up to 5 mm. Musculoskeletal: Marked degenerative change at L5-S1. No acute or suspicious bone lesion. IMPRESSION: 1. No definite CT evidence for acute intra-abdominal or pelvic abnormality. Negative for bowel obstruction or bowel wall thickening. 2. Minimal haziness of the central mesentery with a few small lymph nodes present, could be due to nonspecific mesenteric edema or possibly panniculitis. Electronically Signed   By: Donavan Foil M.D.   On: 09/08/2017 20:46   Dg Abd Acute W/chest  Result Date: 09/08/2017 CLINICAL DATA:  Nausea, vomiting, diarrhea, hyperglycemia, diabetes, former smoking history EXAM: DG ABDOMEN ACUTE W/ 1V CHEST COMPARISON:  Chest x-ray 03/08/2017 FINDINGS: No active infiltrate or effusion is seen. Mediastinal hilar contours are unremarkable. The heart is mildly enlarged and stable. Supine and left lateral decubitus films of the abdomen show no bowel obstruction. No free air is seen. Rounded calcifications are noted to the left of midline of the pelvis. These possibly could be within the urinary bladder or within a urinary bladder diverticulum. These do not appear to be typical of phleboliths or calcified nodes there are degenerative changes present in the lower lumbar spine. IMPRESSION: 1. No active lung disease.  Stable mild cardiomegaly. 2. No bowel obstruction.  No free air. 3. Rounded calcifications in the left pelvis as noted above. CT of the abdomen pelvis may be helpful to localize if necessary. Electronically Signed   By: Ivar Drape  M.D.   On: 09/08/2017 17:11    Pending Labs Unresulted Labs (From admission, onward)   None      Vitals/Pain Today's Vitals   09/08/17 2300 09/08/17 2330 09/09/17 0002 09/09/17 0012  BP: 125/79 106/64 100/71   Pulse: (!) 107 (!) 114 (!) 107   Resp: (!) 24 (!) 23 (!) 29   Temp:      TempSrc:      SpO2: 94% 94% 98%   Weight:      Height:      PainSc:    0-No pain    Isolation Precautions No active isolations  Medications Medications  iopamidol (ISOVUE-300) 61 % injection (not administered)  sodium chloride 0.9 % bolus 1,000 mL (0 mLs Intravenous Stopped 09/08/17 1709)  ondansetron (ZOFRAN) injection 4 mg (4 mg Intravenous Given 09/08/17 1555)  sodium chloride 0.9 % bolus 500 mL (0 mLs Intravenous Stopped 09/08/17 2019)  meclizine (ANTIVERT) tablet 25 mg (25 mg Oral Given 09/08/17 2047)  ciprofloxacin (CIPRO) tablet 500 mg (500 mg Oral Given 09/08/17 2233)  metoCLOPramide (REGLAN) injection 10 mg (10 mg Intravenous Given 09/08/17 2229)    Mobility walks

## 2017-09-09 NOTE — Progress Notes (Signed)
PROGRESS NOTE    Patient: Annette Suarez     PCP: Shirline Frees, MD                    DOB: 1941/05/03            DOA: 09/08/2017 [id number]             DOS: 09/09/2017, 11:48 AM   LOS: 0 days   Date of Service: The patient was seen and examined on 09/09/2017  Subjective:  She was seen and examined this morning aside from episode of diarrhea this morning but reports continued to have nausea but no vomiting. Complete by husband and son at the bedside.  Reporting that metformin also gives some nausea and diarrhea but her doctor continues to prescribe the medication. Denies any fever or chills. Patient urinalysis also consistent with UTI.  ----------------------------------------------------------------------------------------------------------------------  Brief Narrative:   Principal Problem:   Nausea & vomiting Active Problems:   Essential hypertension   Type 2 diabetes mellitus    Sleep apnea   Rheumatoid arthritis of multiple sites with negative rheumatoid factor (HCC)   Dehydration   Acute lower UTI   Assessment & Plan:   Nausea vomiting with dehydration -Unknown etiology, possible gastroenteritis, viral -Gentle IV fluid hydration -Continue Reglan IV every 8 hours -Continue to be symptomatic and no resolution considering MRI of the head, evaluation for possible gastroparesis - Nonspecific haziness in the mesentery and possible panniculitis per CAT scan-closely observe.  Diabetes mellitus type II/hyperglycemia -Home medication Novolin 70/30, patient has been placed on Lantus and sliding scale insulin. -Per patient's family still on metformin which is hard to have diarrhea nausea -plan to hold it   Acute cystitis -Follow with cultures, getting p.o. antibiotics of ciprofloxacin   Hypertension -Stable for now, holding HCTZ/ARB  Diarrhea -If continued episode we will check for C. difficile colitis  History of rheumatoid arthritis  -takes Enbrel and methotrexate on  Tuesdays.  History of sleep apnea  - cont. CPAP.  Hyperlipidemia  - cont.  statins.   DVT prophylaxis: Lovenox. Code Status: Full code. Family Communication: Patient's husband. Disposition Plan: Home. Consults called: None. Admission status: Observation.    Antimicrobials:  Anti-infectives (From admission, onward)   Start     Dose/Rate Route Frequency Ordered Stop   09/09/17 0900  ciprofloxacin (CIPRO) tablet 500 mg     500 mg Oral 2 times daily 09/09/17 0813     09/08/17 2230  ciprofloxacin (CIPRO) tablet 500 mg     500 mg Oral  Once 09/08/17 2216 09/08/17 2233   09/08/17 0000  ciprofloxacin (CIPRO) 500 MG tablet     500 mg Oral 2 times daily 09/08/17 2217        Objective: Vitals:   09/09/17 0002 09/09/17 0038 09/09/17 0046 09/09/17 0512  BP: 100/71 (!) 156/54  (!) 121/47  Pulse: (!) 107 (!) 115  (!) 104  Resp: (!) 29 18  20   Temp:  99.1 F (37.3 C)  99.3 F (37.4 C)  TempSrc:  Oral  Oral  SpO2: 98% 95%  95%  Weight:   72.3 kg (159 lb 6.3 oz)   Height:   5\' 2"  (1.575 m)     Intake/Output Summary (Last 24 hours) at 09/09/2017 1148 Last data filed at 09/09/2017 0802 Gross per 24 hour  Intake 2415 ml  Output -  Net 2415 ml   Filed Weights   09/08/17 1535 09/09/17 0046  Weight: 72.1 kg (159 lb) 72.3  kg (159 lb 6.3 oz)    Examination:  General exam: Appears calm and comfortable  Psychiatry: Judgement and insight appear normal. Mood & affect appropriate. HEENT: WNLs Respiratory system: Clear to auscultation. Respiratory effort normal. Cardiovascular system: S1 & S2 heard, RRR. No JVD, murmurs, rubs, gallops or clicks. No pedal edema. Gastrointestinal system: Abd. nondistended, soft and nontender. No organomegaly or masses felt. Normal bowel sounds heard. Central nervous system: Alert and oriented. No focal neurological deficits. Extremities: Symmetric 5 x 5 power. Skin: No rashes, lesions or ulcers Wounds:   Data Reviewed: I have personally reviewed  following labs and imaging studies  CBC: Recent Labs  Lab 09/08/17 1557 09/08/17 1604 09/09/17 0441  WBC 15.2*  --  9.5  NEUTROABS 14.6*  --   --   HGB 12.6 12.2 10.8*  HCT 36.0 36.0 31.8*  MCV 93.3  --  95.2  PLT 220  --  481   Basic Metabolic Panel: Recent Labs  Lab 09/08/17 1557 09/08/17 1604 09/09/17 0441  NA 144 139 140  K 4.2 3.9 3.5  CL 109 103 107  CO2 23  --  23  GLUCOSE 300* 294* 245*  BUN 18 17 18   CREATININE 0.97 0.80 0.98  CALCIUM 8.1*  --  7.6*  MG  --   --  1.2*   GFR: Estimated Creatinine Clearance: 46.9 mL/min (by C-G formula based on SCr of 0.98 mg/dL). Liver Function Tests: Recent Labs  Lab 09/08/17 1557 09/09/17 0441  AST 30 35  ALT 17 27  ALKPHOS 77 56  BILITOT 1.6* 1.5*  PROT 6.4* 5.7*  ALBUMIN 3.9 3.2*   No results for input(s): LIPASE, AMYLASE in the last 168 hours. No results for input(s): AMMONIA in the last 168 hours. Coagulation Profile: No results for input(s): INR, PROTIME in the last 168 hours. Cardiac Enzymes: No results for input(s): CKTOTAL, CKMB, CKMBINDEX, TROPONINI in the last 168 hours. BNP (last 3 results) No results for input(s): PROBNP in the last 8760 hours. HbA1C: No results for input(s): HGBA1C in the last 72 hours. CBG: Recent Labs  Lab 09/08/17 1527 09/08/17 2043 09/09/17 0112 09/09/17 0735 09/09/17 1137  GLUCAP 293* 203* 226* 212* 168*   Lipid Profile: No results for input(s): CHOL, HDL, LDLCALC, TRIG, CHOLHDL, LDLDIRECT in the last 72 hours. Thyroid Function Tests: No results for input(s): TSH, T4TOTAL, FREET4, T3FREE, THYROIDAB in the last 72 hours. Anemia Panel: No results for input(s): VITAMINB12, FOLATE, FERRITIN, TIBC, IRON, RETICCTPCT in the last 72 hours. Sepsis Labs: No results for input(s): PROCALCITON, LATICACIDVEN in the last 168 hours.  No results found for this or any previous visit (from the past 240 hour(s)).    Radiology Studies: Ct Abdomen Pelvis Wo Contrast  Result Date:  09/08/2017 CLINICAL DATA:  Nausea vomiting diarrhea EXAM: CT ABDOMEN AND PELVIS WITHOUT CONTRAST TECHNIQUE: Multidetector CT imaging of the abdomen and pelvis was performed following the standard protocol without IV contrast. COMPARISON:  None. FINDINGS: Lower chest: Lung bases demonstrate no acute consolidation or pleural effusion. Normal heart size. Hepatobiliary: No focal liver abnormality is seen. Status post cholecystectomy. No biliary dilatation. Pancreas: Unremarkable. No pancreatic ductal dilatation or surrounding inflammatory changes. Spleen: Normal in size without focal abnormality. Adrenals/Urinary Tract: Adrenal glands are within normal limits. No hydronephrosis. No ureteral stone. Nonspecific perinephric fat stranding. The bladder is within normal limits Stomach/Bowel: Stomach is nonenlarged. No dilated small bowel. No colon wall thickening. Appendix not confidently identified but no right lower quadrant inflammation. Extensive sigmoid colon  diverticula without acute disease. Vascular/Lymphatic: Moderate severe aortic atherosclerosis. No aneurysmal dilatation. No significantly enlarged lymph nodes. Reproductive: Status post hysterectomy. No adnexal masses. Other: Negative for free air or free fluid. Small fat in the left inguinal region. Mild hazy appearance of the central mesentery with small scattered lymph nodes measuring up to 5 mm. Musculoskeletal: Marked degenerative change at L5-S1. No acute or suspicious bone lesion. IMPRESSION: 1. No definite CT evidence for acute intra-abdominal or pelvic abnormality. Negative for bowel obstruction or bowel wall thickening. 2. Minimal haziness of the central mesentery with a few small lymph nodes present, could be due to nonspecific mesenteric edema or possibly panniculitis. Electronically Signed   By: Donavan Foil M.D.   On: 09/08/2017 20:46   Dg Abd Acute W/chest  Result Date: 09/08/2017 CLINICAL DATA:  Nausea, vomiting, diarrhea, hyperglycemia, diabetes,  former smoking history EXAM: DG ABDOMEN ACUTE W/ 1V CHEST COMPARISON:  Chest x-ray 03/08/2017 FINDINGS: No active infiltrate or effusion is seen. Mediastinal hilar contours are unremarkable. The heart is mildly enlarged and stable. Supine and left lateral decubitus films of the abdomen show no bowel obstruction. No free air is seen. Rounded calcifications are noted to the left of midline of the pelvis. These possibly could be within the urinary bladder or within a urinary bladder diverticulum. These do not appear to be typical of phleboliths or calcified nodes there are degenerative changes present in the lower lumbar spine. IMPRESSION: 1. No active lung disease.  Stable mild cardiomegaly. 2. No bowel obstruction.  No free air. 3. Rounded calcifications in the left pelvis as noted above. CT of the abdomen pelvis may be helpful to localize if necessary. Electronically Signed   By: Ivar Drape M.D.   On: 09/08/2017 17:11    Scheduled Meds: . aspirin EC  81 mg Oral Daily  . ciprofloxacin  500 mg Oral BID  . enoxaparin (LOVENOX) injection  40 mg Subcutaneous Q24H  . folic acid  2 mg Oral Daily  . insulin aspart  0-9 Units Subcutaneous TID WC  . insulin glargine  10 Units Subcutaneous Daily  . irbesartan  300 mg Oral Daily  . metoCLOPramide (REGLAN) injection  10 mg Intravenous Q8H  . pantoprazole  40 mg Oral Daily  . simvastatin  20 mg Oral Daily   Continuous Infusions: . sodium chloride 125 mL/hr at 09/09/17 0300    Time spent: >25 minutes  Deatra James, MD [facility],  Pager [phone]  If 7PM-7AM, please contact night-coverage [email or web address]   Password [facility]  09/09/2017, 11:48 AM

## 2017-09-09 NOTE — Progress Notes (Signed)
Inpatient Diabetes Program Recommendations  AACE/ADA: New Consensus Statement on Inpatient Glycemic Control (2015)  Target Ranges:  Prepandial:   less than 140 mg/dL      Peak postprandial:   less than 180 mg/dL (1-2 hours)      Critically ill patients:  140 - 180 mg/dL   Lab Results  Component Value Date   GLUCAP 212 (H) 09/09/2017    Review of Glycemic Control  Diabetes history: DM2 Outpatient Diabetes medications: 70/30 30 units bid,  Current orders for Inpatient glycemic control: Lantus 10 units QD, Novolog 0-9 units tidwc Blood sugars in 200s over past 24H.  Inpatient Diabetes Program Recommendations:     HgbA1C to assess glycemic control prior to discharge. Increase Lantus to 15 units Q24H Add Novolog 3 units tidwc for meal coverage insulin if pt is eating > 50%.  Thank you. Lorenda Peck, RD, LDN, CDE Inpatient Diabetes Coordinator [phone]

## 2017-09-09 NOTE — H&P (Signed)
History and Physical    Annette Suarez YQI:[id number] DOB: [birth year] DOA: 09/08/2017  PCP: Shirline Frees, MD  Patient coming from: Home.  Chief Complaint: Nausea vomiting.  HPI: Annette Suarez is a 77 y.o. female with history of diabetes mellitus type 2, hypertension, hyperlipidemia, rheumatoid arthritis presents to the ER with complaints of nausea vomiting.  Patient has been having nausea vomiting since yesterday morning.  Denies any abdominal pain or diarrhea.  Denies any blood in the vomitus.  Denies any fever or chills.  Denies any recent travel or sick contacts.  Denies using any antibiotics recently.  Since patient had persistent vomiting patient presented to the ER.  On standing patient also gets some dizziness.  ED Course: In the ER patient was initially tachycardic and was given fluids.  UA shows ketones.  No definite signs of infection.  CT scan shows nothing acute except for some haziness in the mesentery.  Possible panniculitis.  Patient was given fluids.  Since patient looks dehydrated and also get dizzy on standing patient admitted for further observation.  Review of Systems: As per HPI, rest all negative.   Past Medical History:  Diagnosis Date  . Diabetes mellitus without complication ([place])   . Goiter, nontoxic, multinodular   . Hypertension   . RA (rheumatoid arthritis) ([place])     Past Surgical History:  Procedure Laterality Date  . CHOLECYSTECTOMY       reports that she quit smoking about 39 years ago. she has never used smokeless tobacco. She reports that she does not drink alcohol. Her drug history is not on file.  Allergies  Allergen Reactions  . Latex     Family History  Problem Relation Age of Onset  . Hypertension Other     Prior to Admission medications   Medication Sig Start Date End Date Taking? Authorizing Provider  acetaminophen (TYLENOL 8 HOUR) 650 MG CR tablet Take 650 mg by mouth every 8 (eight) hours as needed for pain.   Yes [provider]  aspirin EC 81 MG tablet Take 81 mg by mouth daily.   Yes [provider]  etanercept (ENBREL SURECLICK) 50 MG/ML injection Inject 0.98 mLs (50 mg total) into the skin once a week. 03/09/17  Yes Deveshwar, Abel Presto, MD  folic acid (FOLVITE) 1 MG tablet Take 2 tablets (2 mg total) by mouth daily. 04/13/17  Yes Deveshwar, Abel Presto, MD  insulin aspart protamine- aspart (NOVOLOG MIX 70/30) (70-30) 100 UNIT/ML injection Inject 30 Units into the skin 2 (two) times daily with a meal.   Yes [provider]  irbesartan (AVAPRO) 300 MG tablet  08/29/16  Yes [provider]  LORazepam (ATIVAN) 0.5 MG tablet Take 0.5 mg by mouth at bedtime as needed for anxiety.  08/13/16  Yes [provider]  metFORMIN (GLUCOPHAGE-XR) 500 MG 24 hr tablet Take 500 mg by mouth daily.  08/30/16  Yes [provider]  methotrexate 50 MG/2ML injection INJECT 0.6 MILLILITERS INTO THE SKIN ONCE WEEKLY 08/18/17  Yes Deveshwar, Abel Presto, MD  omeprazole (PRILOSEC) 40 MG capsule Take 40 mg by mouth daily.  11/10/16  Yes [provider]  simvastatin (ZOCOR) 20 MG tablet Take 20 mg by mouth daily.  08/29/16  Yes [provider]  ciprofloxacin (CIPRO) 500 MG tablet Take 1 tablet (500 mg total) by mouth 2 (two) times daily. 09/08/17   Milton Ferguson, MD  hydrochlorothiazide (HYDRODIURIL) 12.5 MG tablet Take 12.5 mg by mouth daily.  03/14/17   [provider]  meclizine (ANTIVERT) 25 MG tablet Take 1 tablet (25 mg total) by mouth 3 (three) times daily as needed for dizziness. 09/08/17   Milton Ferguson, MD  ondansetron (ZOFRAN ODT) 4 MG disintegrating tablet 4mg  ODT q4 hours prn nausea/vomit 09/08/17   Milton Ferguson, MD  TUBERCULIN SYR 1CC/27GX1/2" (B-D TB SYRINGE 1CC/27GX1/2") 27G X 1/2" 1 ML MISC USE WEEKLY TO INJECT METHOTREXATE 07/19/17   Bo Merino, MD    Physical Exam: Vitals:   09/08/17 2330 09/09/17 0002 09/09/17 0038 09/09/17 0046  BP: 106/64 100/71 (!) 156/54     Pulse: (!) 114 (!) 107 (!) 115   Resp: (!) 23 (!) 29 18   Temp:   99.1 F (37.3 C)   TempSrc:   Oral   SpO2: 94% 98% 95%   Weight:    72.3 kg (159 lb 6.3 oz)  Height:    5\' 2"  (1.575 m)      Constitutional: Moderately built and nourished. Vitals:   09/08/17 2330 09/09/17 0002 09/09/17 0038 09/09/17 0046  BP: 106/64 100/71 (!) 156/54   Pulse: (!) 114 (!) 107 (!) 115   Resp: (!) 23 (!) 29 18   Temp:   99.1 F (37.3 C)   TempSrc:   Oral   SpO2: 94% 98% 95%   Weight:    72.3 kg (159 lb 6.3 oz)  Height:    5\' 2"  (1.575 m)   Eyes: Anicteric no pallor. ENMT: No discharge from the ears eyes nose or mouth. Neck: No mass felt.  No neck rigidity.  No JVD appreciated. Respiratory: No rhonchi or crepitations.  Cardiovascular: S1-S2 heard. Abdomen: Soft nontender bowel sounds present.  No guarding or rigidity. Musculoskeletal: No edema.  No joint effusion. Skin: No rash.  Skin appears warm. Neurologic: Alert awake oriented to time place and person.  Moves all extremities. Psychiatric: Appears normal.  Normal affect.   Labs on Admission: I have personally reviewed following labs and imaging studies  CBC: Recent Labs  Lab 09/08/17 1557 09/08/17 1604  WBC 15.2*  --   NEUTROABS 14.6*  --   HGB 12.6 12.2  HCT 36.0 36.0  MCV 93.3  --   PLT 220  --    Basic Metabolic Panel: Recent Labs  Lab 09/08/17 1557 09/08/17 1604  NA 144 139  K 4.2 3.9  CL 109 103  CO2 23  --   GLUCOSE 300* 294*  BUN 18 17  CREATININE 0.97 0.80  CALCIUM 8.1*  --    GFR: Estimated Creatinine Clearance: 57.5 mL/min (by C-G formula based on SCr of 0.8 mg/dL). Liver Function Tests: Recent Labs  Lab 09/08/17 1557  AST 30  ALT 17  ALKPHOS 77  BILITOT 1.6*  PROT 6.4*  ALBUMIN 3.9   No results for input(s): LIPASE, AMYLASE in the last 168 hours. No results for input(s): AMMONIA in the last 168 hours. Coagulation Profile: No results for input(s): INR, PROTIME in the last 168 hours. Cardiac  Enzymes: No results for input(s): CKTOTAL, CKMB, CKMBINDEX, TROPONINI in the last 168 hours. BNP (last 3 results) No results for input(s): PROBNP in the last 8760 hours. HbA1C: No results for input(s): HGBA1C in the last 72 hours. CBG: Recent Labs  Lab 09/08/17 1527 09/08/17 2043 09/09/17 0112  GLUCAP 293* 203* 226*   Lipid Profile: No results for input(s): CHOL, HDL, LDLCALC, TRIG, CHOLHDL, LDLDIRECT in the last 72 hours. Thyroid Function Tests: No results for input(s): TSH, T4TOTAL, FREET4, T3FREE, THYROIDAB in the  last 72 hours. Anemia Panel: No results for input(s): VITAMINB12, FOLATE, FERRITIN, TIBC, IRON, RETICCTPCT in the last 72 hours. Urine analysis:    Component Value Date/Time   COLORURINE YELLOW 09/08/2017 1759   APPEARANCEUR CLEAR 09/08/2017 1759   LABSPEC 1.012 09/08/2017 1759   PHURINE 5.0 09/08/2017 1759   GLUCOSEU >=500 (A) 09/08/2017 1759   HGBUR MODERATE (A) 09/08/2017 1759   BILIRUBINUR NEGATIVE 09/08/2017 1759   KETONESUR 20 (A) 09/08/2017 1759   PROTEINUR NEGATIVE 09/08/2017 1759   NITRITE POSITIVE (A) 09/08/2017 1759   LEUKOCYTESUR NEGATIVE 09/08/2017 1759   Sepsis Labs: @LABRCNTIP (procalcitonin:4,lacticidven:4) )No results found for this or any previous visit (from the past 240 hour(s)).   Radiological Exams on Admission: Ct Abdomen Pelvis Wo Contrast  Result Date: 09/08/2017 CLINICAL DATA:  Nausea vomiting diarrhea EXAM: CT ABDOMEN AND PELVIS WITHOUT CONTRAST TECHNIQUE: Multidetector CT imaging of the abdomen and pelvis was performed following the standard protocol without IV contrast. COMPARISON:  None. FINDINGS: Lower chest: Lung bases demonstrate no acute consolidation or pleural effusion. Normal heart size. Hepatobiliary: No focal liver abnormality is seen. Status post cholecystectomy. No biliary dilatation. Pancreas: Unremarkable. No pancreatic ductal dilatation or surrounding inflammatory changes. Spleen: Normal in size without focal  abnormality. Adrenals/Urinary Tract: Adrenal glands are within normal limits. No hydronephrosis. No ureteral stone. Nonspecific perinephric fat stranding. The bladder is within normal limits Stomach/Bowel: Stomach is nonenlarged. No dilated small bowel. No colon wall thickening. Appendix not confidently identified but no right lower quadrant inflammation. Extensive sigmoid colon diverticula without acute disease. Vascular/Lymphatic: Moderate severe aortic atherosclerosis. No aneurysmal dilatation. No significantly enlarged lymph nodes. Reproductive: Status post hysterectomy. No adnexal masses. Other: Negative for free air or free fluid. Small fat in the left inguinal region. Mild hazy appearance of the central mesentery with small scattered lymph nodes measuring up to 5 mm. Musculoskeletal: Marked degenerative change at L5-S1. No acute or suspicious bone lesion. IMPRESSION: 1. No definite CT evidence for acute intra-abdominal or pelvic abnormality. Negative for bowel obstruction or bowel wall thickening. 2. Minimal haziness of the central mesentery with a few small lymph nodes present, could be due to nonspecific mesenteric edema or possibly panniculitis. Electronically Signed   By: Donavan Foil M.D.   On: 09/08/2017 20:46   Dg Abd Acute W/chest  Result Date: 09/08/2017 CLINICAL DATA:  Nausea, vomiting, diarrhea, hyperglycemia, diabetes, former smoking history EXAM: DG ABDOMEN ACUTE W/ 1V CHEST COMPARISON:  Chest x-ray 03/08/2017 FINDINGS: No active infiltrate or effusion is seen. Mediastinal hilar contours are unremarkable. The heart is mildly enlarged and stable. Supine and left lateral decubitus films of the abdomen show no bowel obstruction. No free air is seen. Rounded calcifications are noted to the left of midline of the pelvis. These possibly could be within the urinary bladder or within a urinary bladder diverticulum. These do not appear to be typical of phleboliths or calcified nodes there are  degenerative changes present in the lower lumbar spine. IMPRESSION: 1. No active lung disease.  Stable mild cardiomegaly. 2. No bowel obstruction.  No free air. 3. Rounded calcifications in the left pelvis as noted above. CT of the abdomen pelvis may be helpful to localize if necessary. Electronically Signed   By: Ivar Drape M.D.   On: 09/08/2017 17:11     Assessment/Plan Principal Problem:   Nausea & vomiting Active Problems:   Essential hypertension   Type 2 diabetes mellitus    Sleep apnea   Rheumatoid arthritis of multiple sites with negative rheumatoid factor (  [place])   Dehydration    1. Nausea and vomiting with dehydration -cause unclear.  Could be viral gastroenteritis.  We will continue with hydration.  Closely observe.  I have placed patient on full liquid diet for now.  Since patient also has dizziness and if vomiting persist may consider MRI brain. 2. Nonspecific haziness in the mesentery and possible panniculitis per CAT scan-closely observe. 3. Diabetes mellitus type 2 presently hyperglycemic -patient usually takes Novolin 70/30.  I have placed patient on Lantus 10 units along with sliding scale coverage for now.  Closely follow CBGs.  Patient agreeable for being placed on Lantus. 4. Hypertension -patient states he does not take hydrochlorothiazide.  Takes ARB.  Which will be continued. 5. History of rheumatoid arthritis -takes Enbrel and methotrexate on Tuesdays. 6. History of sleep apnea on CPAP. 7. Hyperlipidemia on statins.   DVT prophylaxis: Lovenox. Code Status: Full code. Family Communication: Patient's husband. Disposition Plan: Home. Consults called: None. Admission status: Observation.   Rise Patience MD [facility] Pager [phone].  If 7PM-7AM, please contact night-coverage [email or web address] Password [email or web address]  09/09/2017, 2:20 AM

## 2017-09-09 NOTE — Progress Notes (Signed)
Pt has declined the use of CPAP QHS.  RT to monitor and assess as needed.  

## 2017-09-10 DIAGNOSIS — N390 Urinary tract infection, site not specified: Secondary | ICD-10-CM | POA: Diagnosis not present

## 2017-09-10 DIAGNOSIS — R112 Nausea with vomiting, unspecified: Secondary | ICD-10-CM | POA: Diagnosis not present

## 2017-09-10 LAB — GLUCOSE, CAPILLARY: Glucose-Capillary: 158 mg/dL — ABNORMAL HIGH (ref 65–99)

## 2017-09-10 LAB — BASIC METABOLIC PANEL
Anion gap: 7 (ref 5–15)
BUN: 15 mg/dL (ref 6–20)
CO2: 24 mmol/L (ref 22–32)
Calcium: 9.1 mg/dL (ref 8.9–10.3)
Chloride: 109 mmol/L (ref 101–111)
Creatinine, Ser: 0.98 mg/dL (ref 0.44–1.00)
GFR calc Af Amer: >60 mL/min (ref >60)
GFR calc non Af Amer: 55 mL/min — ABNORMAL LOW (ref >60)
Glucose, Bld: 169 mg/dL — ABNORMAL HIGH (ref 65–99)
Potassium: 3.7 mmol/L (ref 3.5–5.1)
Sodium: 140 mmol/L (ref 135–145)

## 2017-09-10 MED ORDER — CIPROFLOXACIN HCL 500 MG PO TABS: 500.0000 mg | ORAL_TABLET | Freq: Two times a day (BID) | ORAL | 0 refills | Status: AC

## 2017-09-10 MED ORDER — ONDANSETRON 4 MG PO TBDP: ORAL_TABLET | ORAL | 0 refills | Status: DC

## 2017-09-10 NOTE — Discharge Summary (Signed)
Physician Discharge Summary  Annette Suarez EQA:[id number] DOB: 05/11/1941 DOA: 09/08/2017  PCP: Shirline Frees, MD  Admit date: 09/08/2017 Discharge date: 09/10/2017  Admitted From: Home Disposition:  Home  Discharge Condition: Stable CODE STATUS:Full Diet recommendation: Heart Healthy  Brief/Interim Summary:  Admission H and P:  Annette Suarez is a 77 y.o. female with history of diabetes mellitus type 2, hypertension, hyperlipidemia, rheumatoid arthritis presents to the ER with complaints of nausea vomiting.  Patient has been having nausea vomiting since yesterday morning.  Denies any abdominal pain or diarrhea.  Denies any blood in the vomitus.  Denies any fever or chills.  Denies any recent travel or sick contacts.  Denies using any antibiotics recently.  Since patient had persistent vomiting patient presented to the ER.  On standing patient also gets some dizziness.  Hospital course: Patient was admitted and started on IV fluids.  Antiemetic were administered. she was also suspected to have urinary tract infection and she was started on antibiotics. During my evaluation today, she feels very comfortable.  Nausea and vomiting stopped.  Patient is stable for discharge to home.   Following problems were addressed during her  hospitalization:   Nausea vomiting with dehydration -Unknown etiology, possible gastroenteritis, viral -Was treated with IV fluid hydration -Continue PRN antiemetics at home  Diabetes mellitus type II/hyperglycemia -Continue the home regimen  UTI -On p.o. antibiotics , ciprofloxacin.  Patient denies any dysuria.  She is afebrile.  Her white cell counts are stable.  Hypertension -Continue home meds  History of rheumatoid arthritis -takes Enbrel and methotrexate on Tuesdays.  History of sleep apnea  - cont. CPAP.  Hyperlipidemia  - cont.  statins.         Discharge Diagnoses:  Principal Problem:   Nausea & vomiting Active Problems:    Essential hypertension   Type 2 diabetes mellitus    Sleep apnea   Rheumatoid arthritis of multiple sites with negative rheumatoid factor (HCC)   Dehydration   Acute lower UTI    Discharge Instructions  Discharge Instructions    Diet - low sodium heart healthy   Complete by:  As directed    Discharge instructions   Complete by:  As directed    1) Follow up with your PMD in a week. 2) Do CBC and BMP test during the follow up with your PMD. 3) Take prescribed medications as instructed.   Increase activity slowly   Complete by:  As directed      Allergies as of 09/10/2017      Reactions   Latex       Medication List    TAKE these medications   aspirin EC 81 MG tablet Take 81 mg by mouth daily.   ciprofloxacin 500 MG tablet Commonly known as:  CIPRO Take 1 tablet (500 mg total) by mouth 2 (two) times daily for 2 days.   etanercept 50 MG/ML injection Commonly known as:  ENBREL SURECLICK Inject 9.79 mLs (50 mg total) into the skin once a week.   folic acid 1 MG tablet Commonly known as:  FOLVITE Take 2 tablets (2 mg total) by mouth daily.   hydrochlorothiazide 12.5 MG tablet Commonly known as:  HYDRODIURIL Take 12.5 mg by mouth daily.   insulin aspart protamine- aspart (70-30) 100 UNIT/ML injection Commonly known as:  NOVOLOG MIX 70/30 Inject 30 Units into the skin 2 (two) times daily with a meal.   irbesartan 300 MG tablet Commonly known as:  AVAPRO   LORazepam  0.5 MG tablet Commonly known as:  ATIVAN Take 0.5 mg by mouth at bedtime as needed for anxiety.   metFORMIN 500 MG 24 hr tablet Commonly known as:  GLUCOPHAGE-XR Take 500 mg by mouth daily.   methotrexate 50 MG/2ML injection INJECT 0.6 MILLILITERS INTO THE SKIN ONCE WEEKLY   omeprazole 40 MG capsule Commonly known as:  PRILOSEC Take 40 mg by mouth daily.   ondansetron 4 MG disintegrating tablet Commonly known as:  ZOFRAN ODT 4mg  ODT q4 hours prn nausea/vomit   simvastatin 20 MG  tablet Commonly known as:  ZOCOR Take 20 mg by mouth daily.   TUBERCULIN SYR 1CC/27GX1/2" 27G X 1/2" 1 ML Misc Commonly known as:  B-D TB SYRINGE 1CC/27GX1/2" USE WEEKLY TO INJECT METHOTREXATE   TYLENOL 8 HOUR 650 MG CR tablet Generic drug:  acetaminophen Take 650 mg by mouth every 8 (eight) hours as needed for pain.      Follow-up Information    Shirline Frees, MD. Schedule an appointment as soon as possible for a visit in 1 week(s).   Specialty:  Family Medicine Contact information: [place] [address] [phone]          Allergies  Allergen Reactions  . Latex     Consultations: None  Procedures/Studies: Ct Abdomen Pelvis Wo Contrast  Result Date: 09/08/2017 CLINICAL DATA:  Nausea vomiting diarrhea EXAM: CT ABDOMEN AND PELVIS WITHOUT CONTRAST TECHNIQUE: Multidetector CT imaging of the abdomen and pelvis was performed following the standard protocol without IV contrast. COMPARISON:  None. FINDINGS: Lower chest: Lung bases demonstrate no acute consolidation or pleural effusion. Normal heart size. Hepatobiliary: No focal liver abnormality is seen. Status post cholecystectomy. No biliary dilatation. Pancreas: Unremarkable. No pancreatic ductal dilatation or surrounding inflammatory changes. Spleen: Normal in size without focal abnormality. Adrenals/Urinary Tract: Adrenal glands are within normal limits. No hydronephrosis. No ureteral stone. Nonspecific perinephric fat stranding. The bladder is within normal limits Stomach/Bowel: Stomach is nonenlarged. No dilated small bowel. No colon wall thickening. Appendix not confidently identified but no right lower quadrant inflammation. Extensive sigmoid colon diverticula without acute disease. Vascular/Lymphatic: Moderate severe aortic atherosclerosis. No aneurysmal dilatation. No significantly enlarged lymph nodes. Reproductive: Status post hysterectomy. No adnexal masses. Other: Negative for free air or  free fluid. Small fat in the left inguinal region. Mild hazy appearance of the central mesentery with small scattered lymph nodes measuring up to 5 mm. Musculoskeletal: Marked degenerative change at L5-S1. No acute or suspicious bone lesion. IMPRESSION: 1. No definite CT evidence for acute intra-abdominal or pelvic abnormality. Negative for bowel obstruction or bowel wall thickening. 2. Minimal haziness of the central mesentery with a few small lymph nodes present, could be due to nonspecific mesenteric edema or possibly panniculitis. Electronically Signed   By: Donavan Foil M.D.   On: 09/08/2017 20:46   US Abdomen Limited  Result Date: 09/09/2017 CLINICAL DATA:  Right upper quadrant pain with nausea vomiting and diarrhea EXAM: ULTRASOUND ABDOMEN LIMITED RIGHT UPPER QUADRANT COMPARISON:  CT 09/08/2017 FINDINGS: Gallbladder: Surgically absent Common bile duct: Diameter: 5.6 mm Liver: No focal lesion identified. Within normal limits in parenchymal echogenicity. Portal vein is patent on color Doppler imaging with normal direction of blood flow towards the liver. IMPRESSION: 1. Surgical absence of the gallbladder.  No biliary dilatation 2. Otherwise negative right upper quadrant abdominal ultrasound Electronically Signed   By: Donavan Foil M.D.   On: 09/09/2017 18:01   Dg Abd Acute W/chest  Result Date: 09/08/2017  CLINICAL DATA:  Nausea, vomiting, diarrhea, hyperglycemia, diabetes, former smoking history EXAM: DG ABDOMEN ACUTE W/ 1V CHEST COMPARISON:  Chest x-ray 03/08/2017 FINDINGS: No active infiltrate or effusion is seen. Mediastinal hilar contours are unremarkable. The heart is mildly enlarged and stable. Supine and left lateral decubitus films of the abdomen show no bowel obstruction. No free air is seen. Rounded calcifications are noted to the left of midline of the pelvis. These possibly could be within the urinary bladder or within a urinary bladder diverticulum. These do not appear to be typical of  phleboliths or calcified nodes there are degenerative changes present in the lower lumbar spine. IMPRESSION: 1. No active lung disease.  Stable mild cardiomegaly. 2. No bowel obstruction.  No free air. 3. Rounded calcifications in the left pelvis as noted above. CT of the abdomen pelvis may be helpful to localize if necessary. Electronically Signed   By: Ivar Drape M.D.   On: 09/08/2017 17:11    (Echo, Carotid, EGD, Colonoscopy, ERCP)    Subjective: Patient seen and examined the bedside this morning.  Remains comfortable.  Denies any nausea, vomiting or abdominal pain.  Stable for discharge to home today.  Discharge Exam: Vitals:   09/09/17 2136 09/10/17 0448  BP: 139/71 (!) 190/87  Pulse: 63 76  Resp: 18 18  Temp: 98 F (36.7 C) 98 F (36.7 C)  SpO2: 96% 98%   Vitals:   09/09/17 0512 09/09/17 1430 09/09/17 2136 09/10/17 0448  BP: (!) 121/47 (!) 114/44 139/71 (!) 190/87  Pulse: (!) 104 90 63 76  Resp: 20 18 18 18   Temp: 99.3 F (37.4 C) 98.8 F (37.1 C) 98 F (36.7 C) 98 F (36.7 C)  TempSrc: Oral Oral Oral Oral  SpO2: 95% 97% 96% 98%  Weight:      Height:        General: Pt is alert, awake, not in acute distress Cardiovascular: RRR, S1/S2 +, no rubs, no gallops Respiratory: CTA bilaterally, no wheezing, no rhonchi Abdominal: Soft, NT, ND, bowel sounds + Extremities: no edema, no cyanosis    The results of significant diagnostics from this hospitalization (including imaging, microbiology, ancillary and laboratory) are listed below for reference.     Microbiology: No results found for this or any previous visit (from the past 240 hour(s)).   Labs: BNP (last 3 results) No results for input(s): BNP in the last 8760 hours. Basic Metabolic Panel: Recent Labs  Lab 09/08/17 1557 09/08/17 1604 09/09/17 0441 09/10/17 0454  NA 144 139 140 140  K 4.2 3.9 3.5 3.7  CL 109 103 107 109  CO2 23  --  23 24  GLUCOSE 300* 294* 245* 169*  BUN 18 17 18 15   CREATININE  0.97 0.80 0.98 0.98  CALCIUM 8.1*  --  7.6* 9.1  MG  --   --  1.2*  --    Liver Function Tests: Recent Labs  Lab 09/08/17 1557 09/09/17 0441  AST 30 35  ALT 17 27  ALKPHOS 77 56  BILITOT 1.6* 1.5*  PROT 6.4* 5.7*  ALBUMIN 3.9 3.2*   No results for input(s): LIPASE, AMYLASE in the last 168 hours. No results for input(s): AMMONIA in the last 168 hours. CBC: Recent Labs  Lab 09/08/17 1557 09/08/17 1604 09/09/17 0441  WBC 15.2*  --  9.5  NEUTROABS 14.6*  --   --   HGB 12.6 12.2 10.8*  HCT 36.0 36.0 31.8*  MCV 93.3  --  95.2  PLT 220  --  210   Cardiac Enzymes: No results for input(s): CKTOTAL, CKMB, CKMBINDEX, TROPONINI in the last 168 hours. BNP: Invalid input(s): POCBNP CBG: Recent Labs  Lab 09/09/17 0735 09/09/17 1137 09/09/17 1635 09/09/17 2127 09/10/17 0754  GLUCAP 212* 168* 121* 159* 158*   D-Dimer No results for input(s): DDIMER in the last 72 hours. Hgb A1c No results for input(s): HGBA1C in the last 72 hours. Lipid Profile No results for input(s): CHOL, HDL, LDLCALC, TRIG, CHOLHDL, LDLDIRECT in the last 72 hours. Thyroid function studies No results for input(s): TSH, T4TOTAL, T3FREE, THYROIDAB in the last 72 hours.  Invalid input(s): FREET3 Anemia work up No results for input(s): VITAMINB12, FOLATE, FERRITIN, TIBC, IRON, RETICCTPCT in the last 72 hours. Urinalysis    Component Value Date/Time   COLORURINE YELLOW 09/08/2017 1759   APPEARANCEUR CLEAR 09/08/2017 1759   LABSPEC 1.012 09/08/2017 1759   PHURINE 5.0 09/08/2017 1759   GLUCOSEU >=500 (A) 09/08/2017 1759   HGBUR MODERATE (A) 09/08/2017 1759   BILIRUBINUR NEGATIVE 09/08/2017 1759   KETONESUR 20 (A) 09/08/2017 1759   PROTEINUR NEGATIVE 09/08/2017 1759   NITRITE POSITIVE (A) 09/08/2017 1759   LEUKOCYTESUR NEGATIVE 09/08/2017 1759   Sepsis Labs Invalid input(s): PROCALCITONIN,  WBC,  LACTICIDVEN Microbiology No results found for this or any previous visit (from the past 240  hour(s)).   Time coordinating discharge: Over 30 minutes  SIGNED:   Marene Lenz, MD  [facility] 09/10/2017, 11:47 AM Pager [phone]  If 7PM-7AM, please contact night-coverage [email or web address] Password [email or web address]

## 2017-09-12 ENCOUNTER — Ambulatory Visit: Payer: Medicare Other | Admitting: Rheumatology

## 2017-09-12 ENCOUNTER — Encounter: Payer: Self-pay | Admitting: Rheumatology

## 2017-09-12 VITALS — BP 121/78 | HR 78 | Resp 18 | Ht 62.0 in | Wt 161.0 lb

## 2017-09-12 DIAGNOSIS — Z8669 Personal history of other diseases of the nervous system and sense organs: Secondary | ICD-10-CM

## 2017-09-12 DIAGNOSIS — Z8639 Personal history of other endocrine, nutritional and metabolic disease: Secondary | ICD-10-CM | POA: Diagnosis not present

## 2017-09-12 DIAGNOSIS — M19042 Primary osteoarthritis, left hand: Secondary | ICD-10-CM

## 2017-09-12 DIAGNOSIS — Z8659 Personal history of other mental and behavioral disorders: Secondary | ICD-10-CM

## 2017-09-12 DIAGNOSIS — M0609 Rheumatoid arthritis without rheumatoid factor, multiple sites: Secondary | ICD-10-CM | POA: Diagnosis not present

## 2017-09-12 DIAGNOSIS — Z8679 Personal history of other diseases of the circulatory system: Secondary | ICD-10-CM

## 2017-09-12 DIAGNOSIS — Z79899 Other long term (current) drug therapy: Secondary | ICD-10-CM

## 2017-09-12 DIAGNOSIS — Z8719 Personal history of other diseases of the digestive system: Secondary | ICD-10-CM | POA: Diagnosis not present

## 2017-09-12 DIAGNOSIS — M79671 Pain in right foot: Secondary | ICD-10-CM | POA: Diagnosis not present

## 2017-09-12 DIAGNOSIS — M19041 Primary osteoarthritis, right hand: Secondary | ICD-10-CM

## 2017-09-12 DIAGNOSIS — M8589 Other specified disorders of bone density and structure, multiple sites: Secondary | ICD-10-CM

## 2017-09-12 LAB — URIC ACID: Uric Acid, Serum: 5.7 mg/dL (ref 2.5–7.0)

## 2017-09-12 NOTE — Patient Instructions (Signed)
Standing Labs We placed an order today for your standing lab work.    Please come back and get your standing labs in June and every 3 months  We have open lab Monday through Friday from 8:30-11:30 AM and 1:30-4 PM at the office of Dr. Kaile Bixler.   The office is located at [address], [address], [place], [place] [address] No appointment is necessary.   Labs are drawn by [facility].  You may receive a bill from [facility] for your lab work. If you have any questions regarding directions or hours of operation,  please call [phone].    

## 2017-09-13 NOTE — Progress Notes (Signed)
Uric acid normal

## 2017-09-15 LAB — BASIC METABOLIC PANEL
BUN: 12 (ref 4–21)
Chloride: 103 (ref 99–108)
Creatinine: 0.8 (ref 0.5–1.1)
Glucose: 182
Potassium: 4.5 (ref 3.4–5.3)
Sodium: 140 (ref 137–147)

## 2017-09-15 LAB — COMPREHENSIVE METABOLIC PANEL
GFR calc Af Amer: 86
GFR calc non Af Amer: 71

## 2017-11-29 NOTE — Progress Notes (Signed)
Office Visit Note  Patient: Annette Suarez             Date of Birth: Apr 06, 1941           MRN: [id number]             PCP: Shirline Frees, MD Referring: Shirline Frees, MD Visit Date: 12/13/2017 Occupation: @GUAROCC @    Subjective:  Follow-up (BI LAT HAND PAIN AND WEAKNESS, POSSIBLE BLOOD WORK)   History of Present Illness: Annette Suarez is a 77 y.o. female with history of rheumatoid arthritis and osteoarthritis.  She states she was recently hospitalized due to nausea and vomiting.  She was diagnosed with possible GI or UTI infection.  The symptoms have resolved now.  Her arthritis is well controlled.  She states she had swelling in her right first MTP joint 1 day which is resolved now.  Activities of Daily Living:  Patient reports morning stiffness for 10 minutes.   Patient Reports nocturnal pain.  Difficulty dressing/grooming: Denies Difficulty climbing stairs: Reports Difficulty getting out of chair: Denies Difficulty using hands for taps, buttons, cutlery, and/or writing: Reports   Review of Systems  Constitutional: Positive for fatigue. Negative for fever, night sweats, weight gain and weight loss.  HENT: Negative for ear pain, mouth sores, trouble swallowing, trouble swallowing, mouth dryness and nose dryness.   Eyes: Negative for pain, redness, visual disturbance and dryness.  Respiratory: Negative for cough, shortness of breath and difficulty breathing.   Cardiovascular: Positive for swelling in legs/feet. Negative for chest pain, palpitations, hypertension and irregular heartbeat.  Gastrointestinal: Negative for blood in stool, constipation and diarrhea.  Endocrine: Negative for increased urination.  Genitourinary: Negative for difficulty urinating and vaginal dryness.  Musculoskeletal: Positive for morning stiffness. Negative for arthralgias, joint pain, joint swelling, myalgias, muscle weakness, muscle tenderness and myalgias.  Skin: Positive for sensitivity to  sunlight. Negative for color change, rash, hair loss, skin tightness and ulcers.  Allergic/Immunologic: Negative for susceptible to infections.  Neurological: Negative for dizziness, numbness, memory loss, night sweats and weakness.  Hematological: Negative for bruising/bleeding tendency and swollen glands.  Psychiatric/Behavioral: Positive for depressed mood and sleep disturbance. The patient is not nervous/anxious.     PMFS History:  Patient Active Problem List   Diagnosis Date Noted  . Primary osteoarthritis of both feet 12/13/2017  . Dehydration 09/09/2017  . Acute lower UTI 09/09/2017  . Nausea & vomiting 09/08/2017  . Primary osteoarthritis of both hands 04/05/2017  . Left carpal tunnel syndrome 11/15/2016  . Rheumatoid arthritis of multiple sites with negative rheumatoid factor ([place]) 10/06/2016  . High risk medication use 10/06/2016  . Dyslipidemia 10/06/2016  . Pain in both hands 09/08/2016  . Essential hypertension 09/08/2016  . Type 2 diabetes mellitus  09/08/2016  . Gastroesophageal reflux disease without esophagitis 09/08/2016  . Osteopenia of multiple sites 09/08/2016  . History of anxiety 09/08/2016  . Benign paroxysmal positional vertigo 09/08/2016  . Smoker 09/08/2016  . Sleep apnea 09/08/2016    Past Medical History:  Diagnosis Date  . Diabetes mellitus without complication ([place])   . Goiter, nontoxic, multinodular   . Hypertension   . RA (rheumatoid arthritis) (HCC)     Family History  Problem Relation Age of Onset  . Hypertension Other   . Diabetes Mother   . Heart disease Father   . Diabetes Brother   . Cancer Sister        breast   . Diabetes Son   . Crohn's disease Son  Past Surgical History:  Procedure Laterality Date  . CHOLECYSTECTOMY     Social History   Social History Narrative  . Not on file     Objective: Vital Signs: BP 118/78 (BP Location: Left Arm, Patient Position: Sitting, Cuff Size: Normal)   Pulse 89   Ht 5\' 1"  (1.549  m)   Wt 157 lb (71.2 kg)   BMI 29.66 kg/m    Physical Exam  Constitutional: She is oriented to person, place, and time. She appears well-developed and well-nourished.  HENT:  Head: Normocephalic and atraumatic.  Eyes: Conjunctivae and EOM are normal.  Neck: Normal range of motion.  Cardiovascular: Normal rate, regular rhythm, normal heart sounds and intact distal pulses.  Pulmonary/Chest: Effort normal and breath sounds normal.  Abdominal: Soft. Bowel sounds are normal.  Lymphadenopathy:    She has no cervical adenopathy.  Neurological: She is alert and oriented to person, place, and time.  Skin: Skin is warm and dry. Capillary refill takes less than 2 seconds.  Psychiatric: She has a normal mood and affect. Her behavior is normal.  Nursing note and vitals reviewed.    Musculoskeletal Exam: C-spine thoracic lumbar spine good range of motion.  Shoulder joints elbow joints wrist joints are good range of motion.  She has DIP PIP thickening in her hands and feet consistent with osteoarthritis.  No synovitis was noted.  Hip joints were in good range of motion.  Knee joints were in good range of motion.  With no synovitis or swelling.  No effusion or warmth was noted.  Bilateral first MTP thickening consistent with bunions was noted.  CDAI Exam: CDAI Homunculus Exam:   Joint Counts:  CDAI Tender Joint count: 0 CDAI Swollen Joint count: 0  Global Assessments:  Patient Global Assessment: 4 Provider Global Assessment: 2  CDAI Calculated Score: 6    Investigation: TB Gold -January 2019. No additional findings. CBC Latest Ref Rng & Units 09/09/2017 09/08/2017 09/08/2017  WBC 4.0 - 10.5 K/uL 9.5 - 15.2(H)  Hemoglobin 12.0 - 15.0 g/dL 10.8(L) 12.2 12.6  Hematocrit 36.0 - 46.0 % 31.8(L) 36.0 36.0  Platelets 150 - 400 K/uL 210 - 220   CMP Latest Ref Rng & Units 09/10/2017 09/09/2017 09/08/2017  Glucose 65 - 99 mg/dL 169(H) 245(H) 294(H)  BUN 6 - 20 mg/dL 15 18 17   Creatinine 0.44 - 1.00 mg/dL  0.98 0.98 0.80  Sodium 135 - 145 mmol/L 140 140 139  Potassium 3.5 - 5.1 mmol/L 3.7 3.5 3.9  Chloride 101 - 111 mmol/L 109 107 103  CO2 22 - 32 mmol/L 24 23 -  Calcium 8.9 - 10.3 mg/dL 9.1 7.6(L) -  Total Protein 6.5 - 8.1 g/dL - 5.7(L) -  Total Bilirubin 0.3 - 1.2 mg/dL - 1.5(H) -  Alkaline Phos 38 - 126 U/L - 56 -  AST 15 - 41 U/L - 35 -  ALT 14 - 54 U/L - 27 -     Imaging: No results found.  Speciality Comments: No specialty comments available.    Procedures:  No procedures performed Allergies: Latex   Assessment / Plan:     Visit Diagnoses: Rheumatoid arthritis of multiple sites with negative rheumatoid factor (HCC)-patient had no synovitis on examination.  She has been doing quite well on combination of Enbrel subcu every other week and methotrexate 0.6 mL subcu weekly.  She would like to reduce the methotrexate dose to 0.4 mL subcu weekly.  I was in agreement .  She will continue Adderall  at current dose.  High risk medication use -  Enbrel 50 mg sq q o wk, MTX 0.6 ml sq qwk, folic acid 1mg  po qd.  Her labs are stable.  She had mild elevation of creatinine probably secondary to dehydration. - Plan: CBC with Differential/Platelet, COMPLETE METABOLIC PANEL WITH GFR today and then every 3 months.  Primary osteoarthritis of both hands-she has severe osteoarthritis in her hands which causes discomfort.  Joint protection muscle strengthening was discussed.  A handout on hand exercises was given.  Primary osteoarthritis of both feet-she has bilateral first MTP thickening.  She is not having much discomfort in her feet.  Osteopenia of multiple sites-use of calcium and vitamin D was discussed.  Essential hypertension-her blood pressure is stable.  History of vertigo  History of hyperlipidemia  History of sleep apnea  History of anxiety  Type 2 diabetes mellitus   Gastroesophageal reflux disease without esophagitis  Former smoker    Orders: Orders Placed This  Encounter  Procedures  . CBC with Differential/Platelet  . COMPLETE METABOLIC PANEL WITH GFR   No orders of the defined types were placed in this encounter.   Face-to-face time spent with patient was 30 minutes.> 50% of time was spent in counseling and coordination of care.  Follow-Up Instructions: Return in about 5 months (around 05/15/2018) for Rheumatoid arthritis, Osteoarthritis.   Bo Merino, MD  Note - This record has been created using Editor, commissioning.  Chart creation errors have been sought, but may not always  have been located. Such creation errors do not reflect on  the standard of medical care.

## 2017-12-13 ENCOUNTER — Ambulatory Visit: Payer: Medicare Other | Admitting: Rheumatology

## 2017-12-13 ENCOUNTER — Encounter: Payer: Self-pay | Admitting: Physician Assistant

## 2017-12-13 VITALS — BP 118/78 | HR 89 | Ht 61.0 in | Wt 157.0 lb

## 2017-12-13 DIAGNOSIS — Z79899 Other long term (current) drug therapy: Secondary | ICD-10-CM

## 2017-12-13 DIAGNOSIS — Z87898 Personal history of other specified conditions: Secondary | ICD-10-CM

## 2017-12-13 DIAGNOSIS — I10 Essential (primary) hypertension: Secondary | ICD-10-CM

## 2017-12-13 DIAGNOSIS — E118 Type 2 diabetes mellitus with unspecified complications: Secondary | ICD-10-CM

## 2017-12-13 DIAGNOSIS — M19042 Primary osteoarthritis, left hand: Secondary | ICD-10-CM

## 2017-12-13 DIAGNOSIS — M0609 Rheumatoid arthritis without rheumatoid factor, multiple sites: Secondary | ICD-10-CM | POA: Diagnosis not present

## 2017-12-13 DIAGNOSIS — Z87891 Personal history of nicotine dependence: Secondary | ICD-10-CM

## 2017-12-13 DIAGNOSIS — Z8659 Personal history of other mental and behavioral disorders: Secondary | ICD-10-CM

## 2017-12-13 DIAGNOSIS — K219 Gastro-esophageal reflux disease without esophagitis: Secondary | ICD-10-CM | POA: Diagnosis not present

## 2017-12-13 DIAGNOSIS — Z8669 Personal history of other diseases of the nervous system and sense organs: Secondary | ICD-10-CM | POA: Diagnosis not present

## 2017-12-13 DIAGNOSIS — M8589 Other specified disorders of bone density and structure, multiple sites: Secondary | ICD-10-CM

## 2017-12-13 DIAGNOSIS — M19072 Primary osteoarthritis, left ankle and foot: Secondary | ICD-10-CM

## 2017-12-13 DIAGNOSIS — Z794 Long term (current) use of insulin: Secondary | ICD-10-CM

## 2017-12-13 DIAGNOSIS — M19041 Primary osteoarthritis, right hand: Secondary | ICD-10-CM

## 2017-12-13 DIAGNOSIS — Z8639 Personal history of other endocrine, nutritional and metabolic disease: Secondary | ICD-10-CM

## 2017-12-13 DIAGNOSIS — M19071 Primary osteoarthritis, right ankle and foot: Secondary | ICD-10-CM

## 2017-12-13 LAB — COMPLETE METABOLIC PANEL WITH GFR
AG Ratio: 1.9 (calc) (ref 1.0–2.5)
ALT: 12 U/L (ref 6–29)
AST: 12 U/L (ref 10–35)
Albumin: 4.5 g/dL (ref 3.6–5.1)
Alkaline phosphatase (APISO): 104 U/L (ref 33–130)
BUN: 19 mg/dL (ref 7–25)
CO2: 25 mmol/L (ref 20–32)
Calcium: 9.4 mg/dL (ref 8.6–10.4)
Chloride: 100 mmol/L (ref 98–110)
Creat: 0.93 mg/dL (ref 0.60–0.93)
GFR, Est African American: 70 mL/min/{1.73_m2} (ref >=60)
GFR, Est Non African American: 61 mL/min/{1.73_m2} (ref >=60)
Globulin: 2.4 g/dL (calc) (ref 1.9–3.7)
Glucose, Bld: 245 mg/dL — ABNORMAL HIGH (ref 65–99)
Potassium: 4.9 mmol/L (ref 3.5–5.3)
Sodium: 136 mmol/L (ref 135–146)
Total Bilirubin: 0.9 mg/dL (ref 0.2–1.2)
Total Protein: 6.9 g/dL (ref 6.1–8.1)

## 2017-12-13 LAB — CBC WITH DIFFERENTIAL/PLATELET
Basophils Absolute: 81 cells/uL (ref 0–200)
Basophils Relative: 0.8 %
Eosinophils Absolute: 192 cells/uL (ref 15–500)
Eosinophils Relative: 1.9 %
HCT: 37.2 % (ref 35.0–45.0)
Hemoglobin: 13 g/dL (ref 11.7–15.5)
Lymphs Abs: 2141 cells/uL (ref 850–3900)
MCH: 31.3 pg (ref 27.0–33.0)
MCHC: 34.9 g/dL (ref 32.0–36.0)
MCV: 89.6 fL (ref 80.0–100.0)
MPV: 10.4 fL (ref 7.5–12.5)
Monocytes Relative: 6 %
Neutro Abs: 7080 cells/uL (ref 1500–7800)
Neutrophils Relative %: 70.1 %
Platelets: 330 10*3/uL (ref 140–400)
RBC: 4.15 10*6/uL (ref 3.80–5.10)
RDW: 13 % (ref 11.0–15.0)
Total Lymphocyte: 21.2 %
WBC mixed population: 606 cells/uL (ref 200–950)
WBC: 10.1 10*3/uL (ref 3.8–10.8)

## 2017-12-13 NOTE — Patient Instructions (Addendum)
Hand Exercises Hand exercises can be helpful to almost anyone. These exercises can strengthen the hands, improve flexibility and movement, and increase blood flow to the hands. These results can make work and daily tasks easier. Hand exercises can be especially helpful for people who have joint pain from arthritis or have nerve damage from overuse (carpal tunnel syndrome). These exercises can also help people who have injured a hand. Most of these hand exercises are fairly gentle stretching routines. You can do them often throughout the day. Still, it is a good idea to ask your health care provider which exercises would be best for you. Warming your hands before exercise may help to reduce stiffness. You can do this with gentle massage or by placing your hands in warm water for 15 minutes. Also, make sure you pay attention to your level of hand pain as you begin an exercise routine. Exercises Knuckle Bend Repeat this exercise 5-10 times with each hand. 1. Stand or sit with your arm, hand, and all five fingers pointed straight up. Make sure your wrist is straight. 2. Gently and slowly bend your fingers down and inward until the tips of your fingers are touching the tops of your palm. 3. Hold this position for a few seconds. 4. Extend your fingers out to their original position, all pointing straight up again.  Finger Fan Repeat this exercise 5-10 times with each hand. 1. Hold your arm and hand out in front of you. Keep your wrist straight. 2. Squeeze your hand into a fist. 3. Hold this position for a few seconds. 4. Fan out, or spread apart, your hand and fingers as much as possible, stretching every joint fully.  Tabletop Repeat this exercise 5-10 times with each hand. 1. Stand or sit with your arm, hand, and all five fingers pointed straight up. Make sure your wrist is straight. 2. Gently and slowly bend your fingers at the knuckles where they meet the hand until your hand is making an  upside-down L shape. Your fingers should form a tabletop. 3. Hold this position for a few seconds. 4. Extend your fingers out to their original position, all pointing straight up again.  Making Os Repeat this exercise 5-10 times with each hand. 1. Stand or sit with your arm, hand, and all five fingers pointed straight up. Make sure your wrist is straight. 2. Make an O shape by touching your pointer finger to your thumb. Hold for a few seconds. Then open your hand wide. 3. Repeat this motion with each finger on your hand.  Table Spread Repeat this exercise 5-10 times with each hand. 1. Place your hand on a table with your palm facing down. Make sure your wrist is straight. 2. Spread your fingers out as much as possible. Hold this position for a few seconds. 3. Slide your fingers back together again. Hold for a few seconds.  Ball Grip  Repeat this exercise 10-15 times with each hand. 1. Hold a tennis ball or another soft ball in your hand. 2. While slowly increasing pressure, squeeze the ball as hard as possible. 3. Squeeze as hard as you can for 3-5 seconds. 4. Relax and repeat.  Wrist Curls Repeat this exercise 10-15 times with each hand. 1. Sit in a chair that has armrests. 2. Hold a light weight in your hand, such as a dumbbell that weighs 1-3 pounds (0.5-1.4 kg). Ask your health care provider what weight would be best for you. 3. Rest your hand just over   the end of the chair arm with your palm facing up. 4. Gently pivot your wrist up and down while holding the weight. Do not twist your wrist from side to side.  Contact a health care provider if:  Your hand pain or discomfort gets much worse when you do an exercise.  Your hand pain or discomfort does not improve within 2 hours after you exercise. If you have any of these problems, stop doing these exercises right away. Do not do them again unless your health care provider says that you can. Get help right away if:  You  develop sudden, severe hand pain. If this happens, stop doing these exercises right away. Do not do them again unless your health care provider says that you can. This information is not intended to replace advice given to you by your health care provider. Make sure you discuss any questions you have with your health care provider. Document Released: 06/02/2015 Document Revised: 11/27/2015 Document Reviewed: 12/30/2014 Elsevier Interactive Patient Education  2018 [place] We placed an order today for your standing lab work.    Please come back and get your standing labs in September and every 3 months  We have open lab Monday through Friday from 8:30-11:30 AM and 1:30-4:00 PM  at the office of Dr. Bo Merino.   You may experience shorter wait times on Monday and Friday afternoons. The office is located at [address] [place], [place], [place] [address] No appointment is necessary.   Labs are drawn by [facility].  You may receive a bill from [facility] for your lab work. If you have any questions regarding directions or hours of operation,  please call [phone].

## 2017-12-14 NOTE — Progress Notes (Signed)
Consult elevated.  Please notify patient.

## 2018-01-03 ENCOUNTER — Telehealth: Payer: Self-pay | Admitting: Rheumatology

## 2018-01-03 MED ORDER — ETANERCEPT 50 MG/ML ~~LOC~~ SOAJ: 50.0000 mg | SUBCUTANEOUS | 0 refills | Status: DC

## 2018-01-03 NOTE — Telephone Encounter (Signed)
Last Visit: 12/13/17 Next Visit: 05/15/18 Labs: 12/13/17 glucose is elevated but everything is normal. TB Gold: 07/20/17   Okay to refill per Hazel Sams, PA-C  Prescription faxed to [facility]

## 2018-01-03 NOTE — Telephone Encounter (Signed)
Patient needs a refill on Enbrel sent to [facility].

## 2018-01-18 ENCOUNTER — Other Ambulatory Visit: Payer: Self-pay | Admitting: Rheumatology

## 2018-01-18 NOTE — Telephone Encounter (Signed)
Last Visit: 12/13/17 Next visit: 05/15/18 Labs: 12/13/17 glucose is elevated but everything is normal.  Okay to refill per Dr. Estanislado Pandy

## 2018-03-09 LAB — LIPID PANEL
Cholesterol: 131 (ref 0–200)
HDL: 40 (ref 35–70)
LDL Cholesterol: 65
Triglycerides: 130 (ref 40–160)

## 2018-03-22 ENCOUNTER — Other Ambulatory Visit: Payer: Self-pay

## 2018-03-22 DIAGNOSIS — Z79899 Other long term (current) drug therapy: Secondary | ICD-10-CM

## 2018-03-22 LAB — COMPLETE METABOLIC PANEL WITH GFR
AG Ratio: 1.8 (calc) (ref 1.0–2.5)
ALT: 12 U/L (ref 6–29)
AST: 14 U/L (ref 10–35)
Albumin: 4.6 g/dL (ref 3.6–5.1)
Alkaline phosphatase (APISO): 89 U/L (ref 33–130)
BUN/Creatinine Ratio: 21 (calc) (ref 6–22)
BUN: 24 mg/dL (ref 7–25)
CO2: 21 mmol/L (ref 20–32)
Calcium: 9.9 mg/dL (ref 8.6–10.4)
Chloride: 106 mmol/L (ref 98–110)
Creat: 1.14 mg/dL — ABNORMAL HIGH (ref 0.60–0.93)
GFR, Est African American: 54 mL/min/{1.73_m2} — ABNORMAL LOW (ref >=60)
GFR, Est Non African American: 47 mL/min/{1.73_m2} — ABNORMAL LOW (ref >=60)
Globulin: 2.5 g/dL (calc) (ref 1.9–3.7)
Glucose, Bld: 166 mg/dL — ABNORMAL HIGH (ref 65–99)
Potassium: 5.5 mmol/L — ABNORMAL HIGH (ref 3.5–5.3)
Sodium: 141 mmol/L (ref 135–146)
Total Bilirubin: 1.1 mg/dL (ref 0.2–1.2)
Total Protein: 7.1 g/dL (ref 6.1–8.1)

## 2018-03-22 LAB — CBC WITH DIFFERENTIAL/PLATELET
Basophils Absolute: 77 cells/uL (ref 0–200)
Basophils Relative: 0.9 %
Eosinophils Absolute: 255 cells/uL (ref 15–500)
Eosinophils Relative: 3 %
HCT: 38.9 % (ref 35.0–45.0)
Hemoglobin: 13.4 g/dL (ref 11.7–15.5)
Lymphs Abs: 2287 cells/uL (ref 850–3900)
MCH: 31.9 pg (ref 27.0–33.0)
MCHC: 34.4 g/dL (ref 32.0–36.0)
MCV: 92.6 fL (ref 80.0–100.0)
MPV: 10.7 fL (ref 7.5–12.5)
Monocytes Relative: 8.1 %
Neutro Abs: 5194 cells/uL (ref 1500–7800)
Neutrophils Relative %: 61.1 %
Platelets: 325 10*3/uL (ref 140–400)
RBC: 4.2 10*6/uL (ref 3.80–5.10)
RDW: 12.5 % (ref 11.0–15.0)
Total Lymphocyte: 26.9 %
WBC mixed population: 689 cells/uL (ref 200–950)
WBC: 8.5 10*3/uL (ref 3.8–10.8)

## 2018-03-23 NOTE — Progress Notes (Signed)
Creatinine is elevated.  It could be secondary to methotrexate use oral diuretics.  She was clinically doing well in June.  It is okay for her to reduce her methotrexate to 0.4 mL weekly.  Repeat BMP in 1 month.

## 2018-03-24 NOTE — Progress Notes (Signed)
Patient may discontinue methotrexate.

## 2018-03-29 ENCOUNTER — Other Ambulatory Visit: Payer: Self-pay | Admitting: Internal Medicine

## 2018-03-29 DIAGNOSIS — E042 Nontoxic multinodular goiter: Secondary | ICD-10-CM

## 2018-04-05 ENCOUNTER — Telehealth: Payer: Self-pay | Admitting: Pharmacy Technician

## 2018-04-05 ENCOUNTER — Telehealth: Payer: Self-pay | Admitting: Rheumatology

## 2018-04-05 ENCOUNTER — Ambulatory Visit
Admission: RE | Admit: 2018-04-05 | Discharge: 2018-04-05 | Disposition: A | Discharge status: Home / Self Care | Payer: Medicare Other | Source: Ambulatory Visit | Attending: Internal Medicine | Admitting: Internal Medicine

## 2018-04-05 DIAGNOSIS — E042 Nontoxic multinodular goiter: Secondary | ICD-10-CM

## 2018-04-05 NOTE — Telephone Encounter (Signed)
Patient has recently stopped taking methotrexate and would like to know if she can also stop taking Folic Acid. She requested a call back at [phone]  10:57 AM Beatriz Chancellor, CPhT

## 2018-04-05 NOTE — Telephone Encounter (Signed)
Spoke to patient about renewal for 2020, She will stop by office to complete application  00:34 AM Annette Suarez, CPhT

## 2018-04-05 NOTE — Telephone Encounter (Signed)
4:36pm Patient left message, returning your call.

## 2018-04-05 NOTE — Telephone Encounter (Signed)
Please see note below. 

## 2018-04-05 NOTE — Telephone Encounter (Signed)
Attempted to contact the patient and left message for patient to call the office.  

## 2018-04-06 NOTE — Telephone Encounter (Signed)
Attempted to contact the patient and left message for patient to call the office.  

## 2018-04-06 NOTE — Telephone Encounter (Signed)
Patient advised she may stop taking her Folic acid since she has discontinued her MTX. Patient verbalized understanding.

## 2018-04-19 ENCOUNTER — Telehealth: Payer: Self-pay | Admitting: Pharmacy Technician

## 2018-04-19 ENCOUNTER — Telehealth: Payer: Self-pay | Admitting: *Deleted

## 2018-04-19 NOTE — Telephone Encounter (Signed)
Patient thought she had a missed phone call from our office. We have not tried to contact patient since 04/06/18 when we spoke with patient regarding stopping her Folic Acid since she is no longer on MTX. Patient advised.

## 2018-04-19 NOTE — Telephone Encounter (Signed)
Received a fax from [employer] regarding a prior authorization for Enbrel. Authorization has been APPROVED from 04/19/18 to 07/05/19.   Will send document to scan center.  Authorization # [id number] Phone # [phone]  9:52 AM Beatriz Chancellor, CPhT

## 2018-04-25 NOTE — Telephone Encounter (Signed)
Faxed Renewal Application to [employer]. Awaiting response.  Will send document to scan center.  Phone# [phone] Fax# [phone]  8:26 AM Beatriz Chancellor, CPhT

## 2018-04-28 IMAGING — CR DG ABDOMEN ACUTE W/ 1V CHEST
4 series · 4 of 4 positions shown · non-contrast
Comparison: Chest x-ray 03/08/2017

CLINICAL DATA: Nausea, vomiting, diarrhea, hyperglycemia, diabetes,
former smoking history

EXAM:
DG ABDOMEN ACUTE W/ 1V CHEST

[w abdomen decub (1 of 2)]
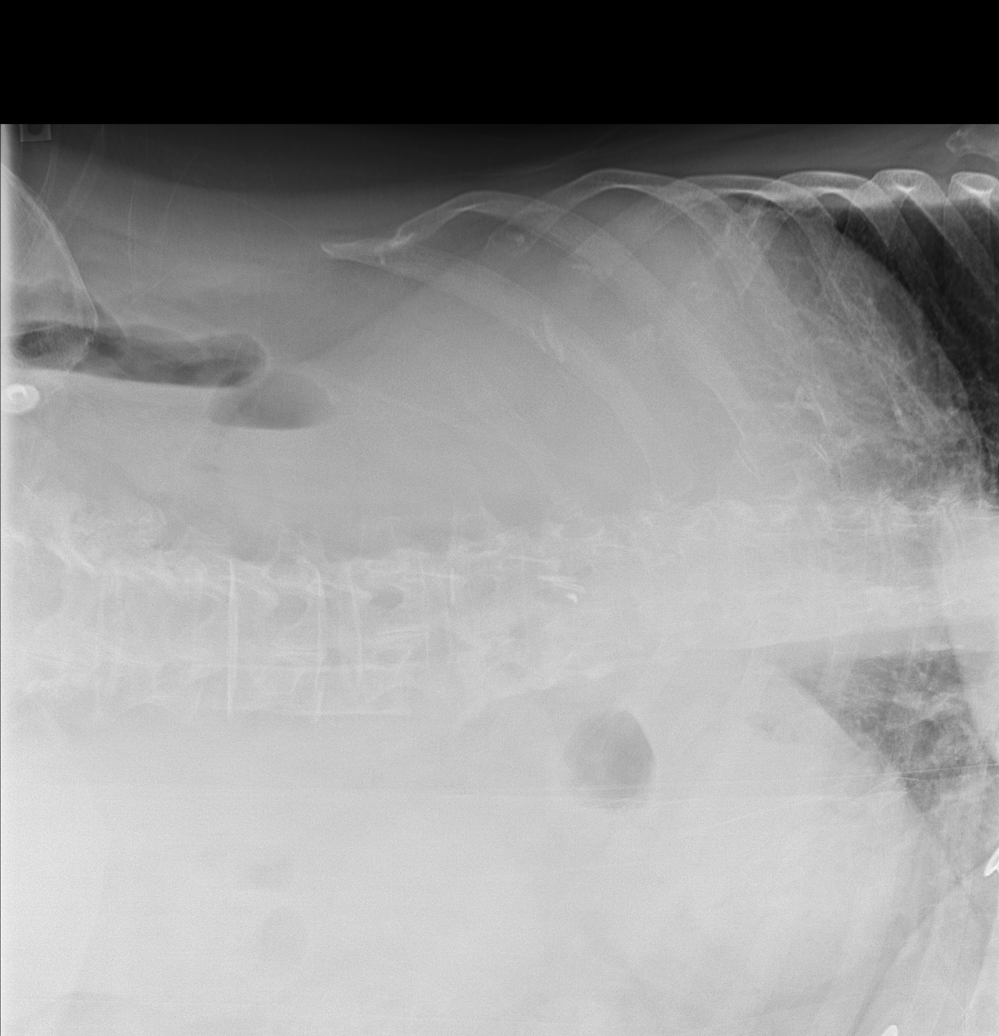

[w abdomen decub (2 of 2)]
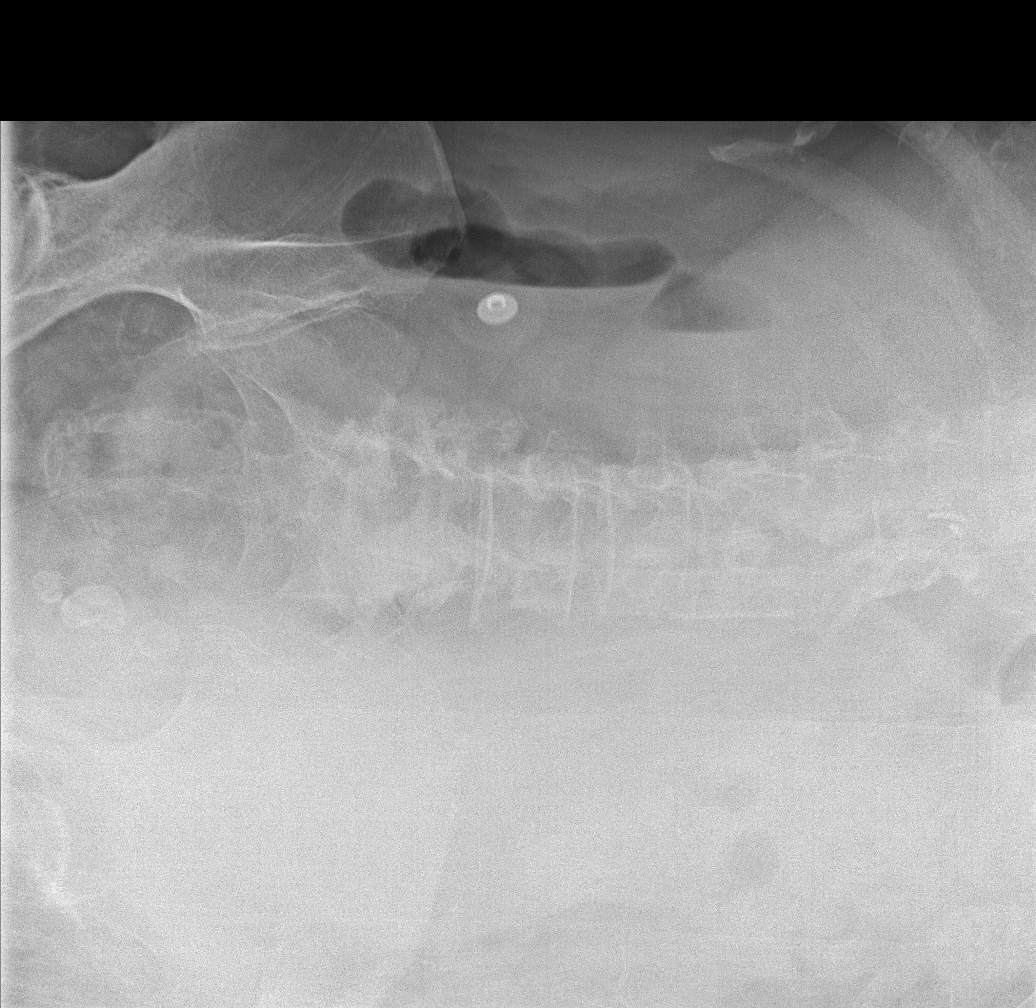

[x chest ap]
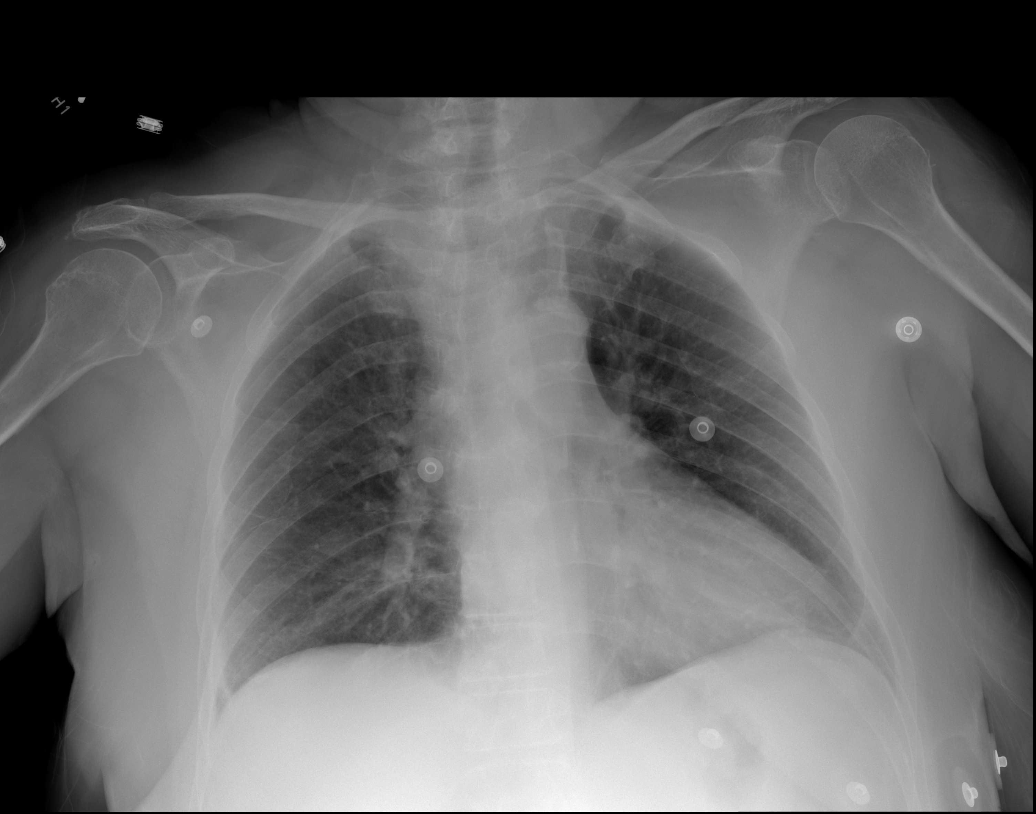

[x abdomen supine]
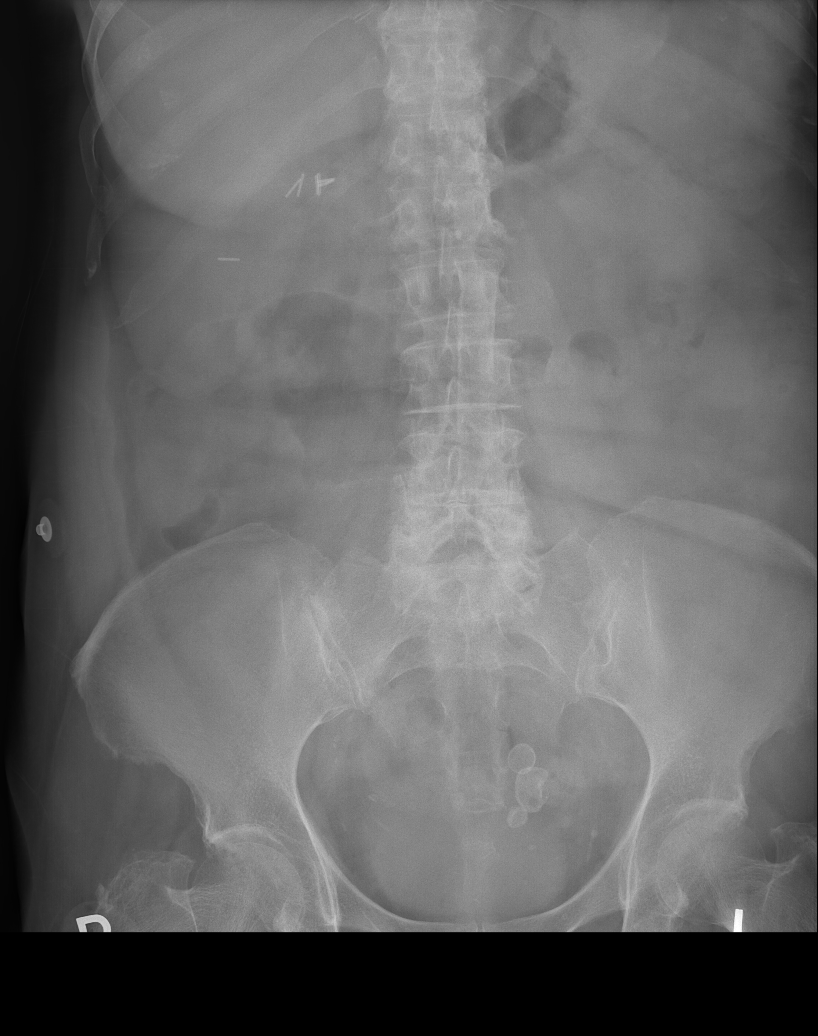

[4 of 4 positions shown; findings below may reference images not displayed]

FINDINGS: No active infiltrate or effusion is seen. Mediastinal hilar contours
are unremarkable. The heart is mildly enlarged and stable.

Supine and left lateral decubitus films of the abdomen show no bowel
obstruction. No free air is seen. Rounded calcifications are noted
to the left of midline of the pelvis. These possibly could be within
the urinary bladder or within a urinary bladder diverticulum. These
do not appear to be typical of phleboliths or calcified nodes there
are degenerative changes present in the lower lumbar spine..
IMPRESSION: 1. No active lung disease.  Stable mild cardiomegaly.
2. No bowel obstruction.  No free air.
3. Rounded calcifications in the left pelvis as noted above. CT of
the abdomen pelvis may be helpful to localize if necessary.

## 2018-04-28 IMAGING — CT CT ABD-PELV W/O CM
2 of 4 series · 16 of 46 positions shown, 18 images · non-contrast
Comparison: None.

CLINICAL DATA: Nausea vomiting diarrhea

EXAM:
CT ABDOMEN AND PELVIS WITHOUT CONTRAST
TECHNIQUE: Multidetector CT imaging of the abdomen and pelvis was performed
following the standard protocol without IV contrast.

[Series 2: axial st · axial · 0.90mm/px · z∈[-824,-400]mm · 13 of 95 slices shown, 15 images]
[im 5/95  soft-tissue]
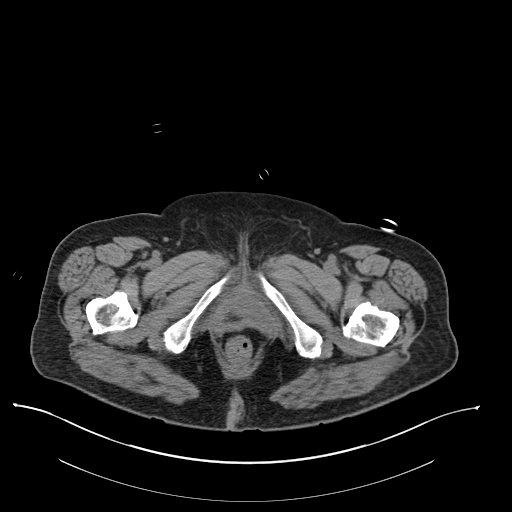
[im 5/95  bone]
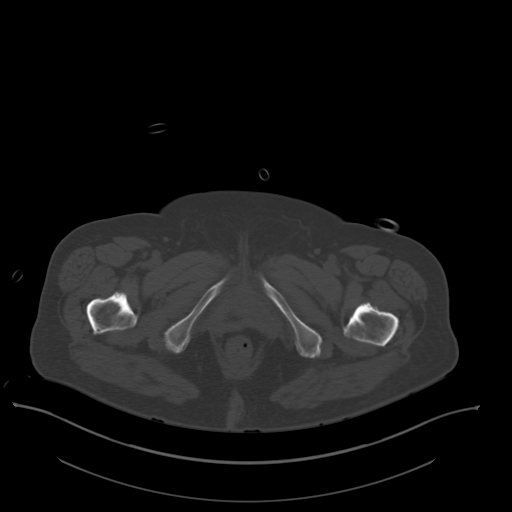
[im 15/95  soft-tissue]
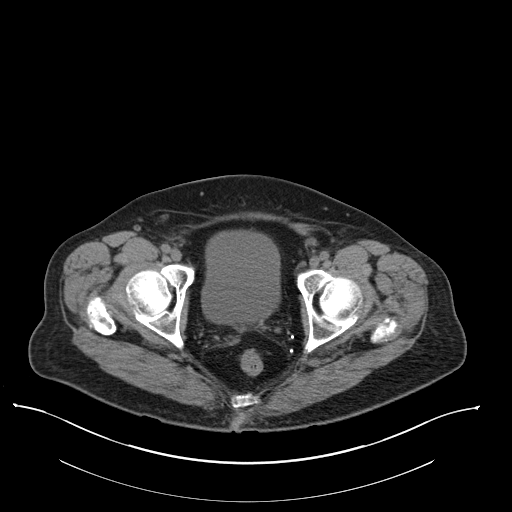
[im 19/95  soft-tissue]
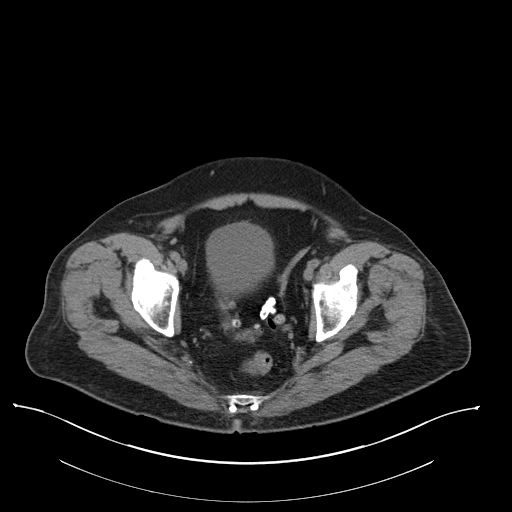
[im 29/95  soft-tissue]
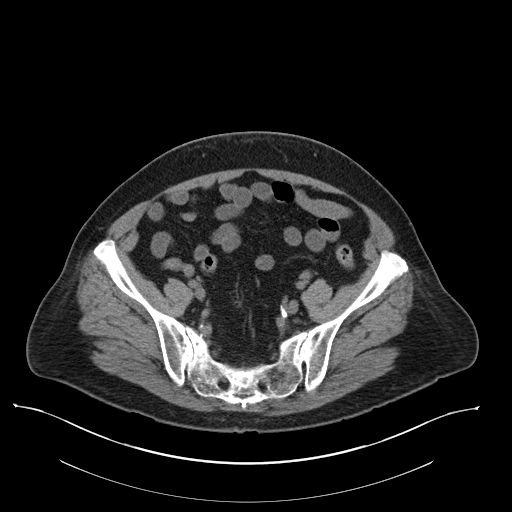
[im 33/95  soft-tissue]
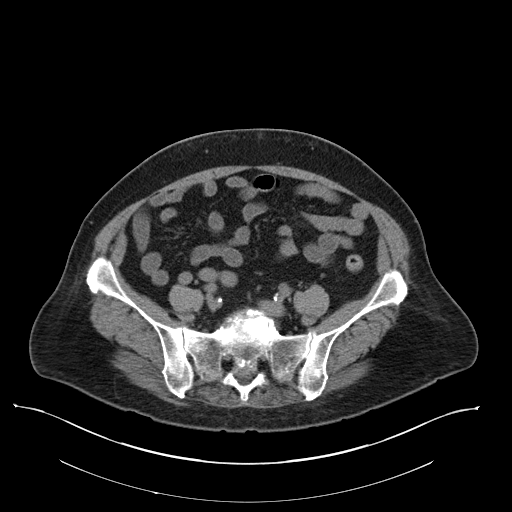
[im 43/95  soft-tissue]
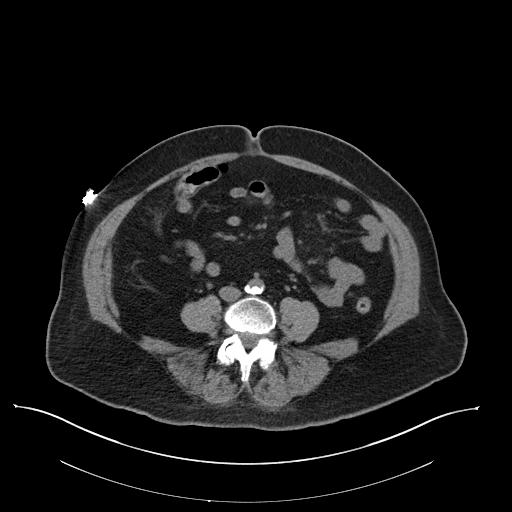
[im 48/95  soft-tissue]
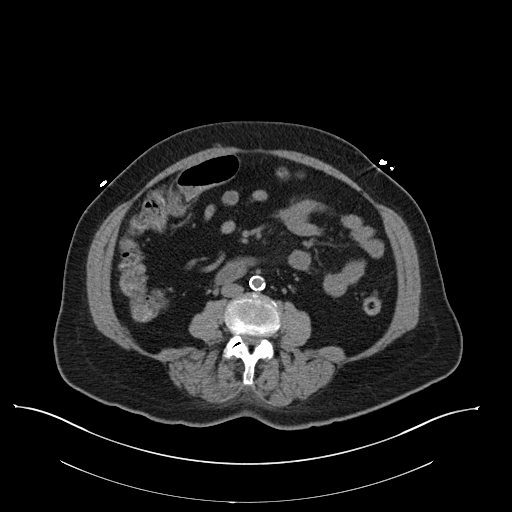
[im 52/95  soft-tissue]
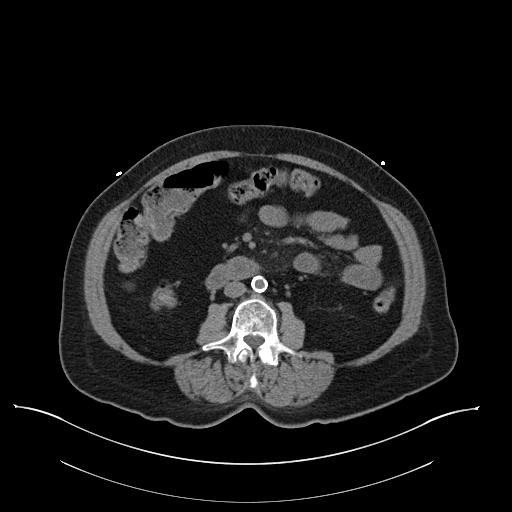
[im 62/95  soft-tissue]
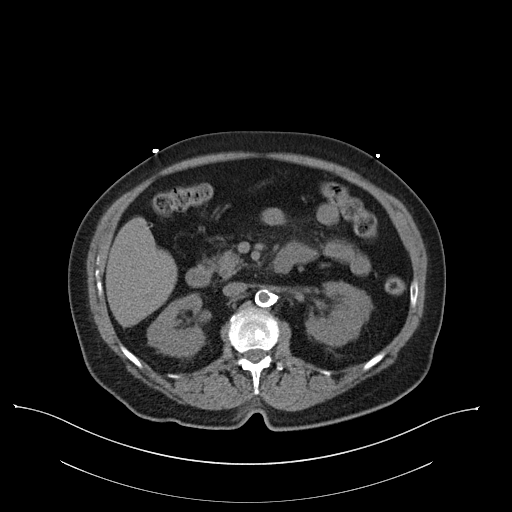
[im 62/95  bone]
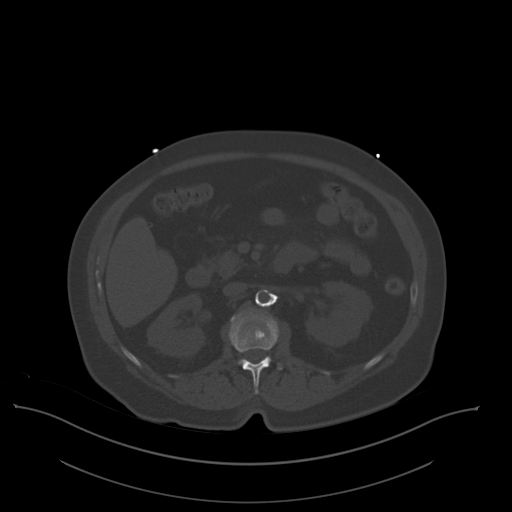
[im 66/95  soft-tissue]
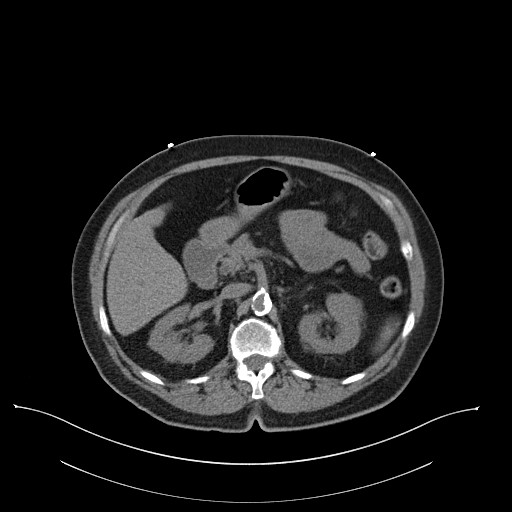
[im 76/95  soft-tissue]
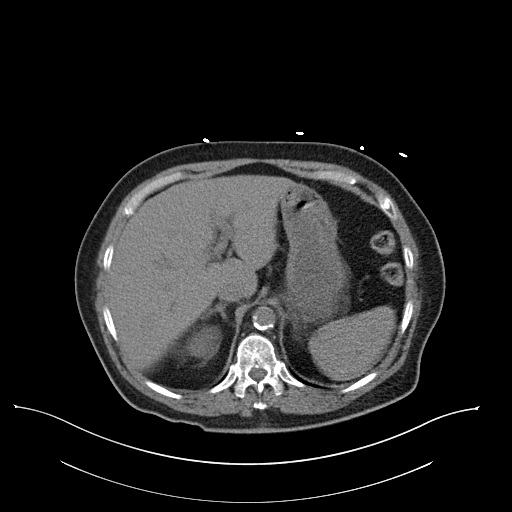
[im 80/95  soft-tissue]
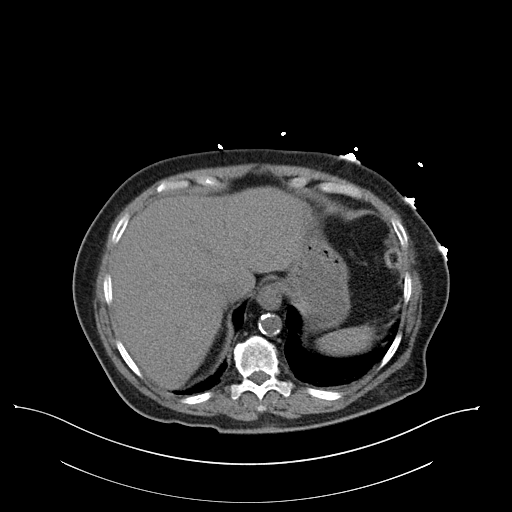
[im 90/95  soft-tissue]
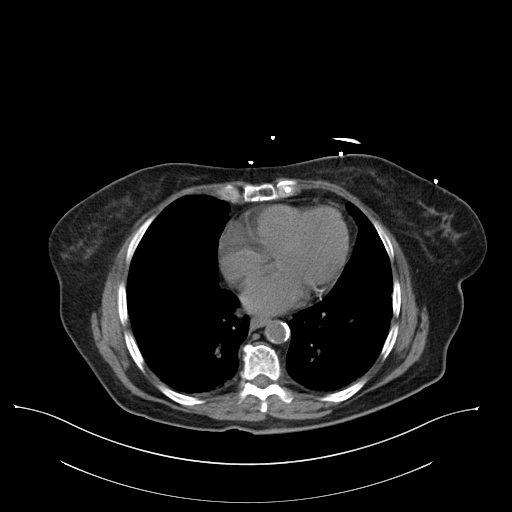

[Series 5: coronal st · coronal · 0.76mm/px · 3 of 98 slices shown]
[im 33/98  soft-tissue]
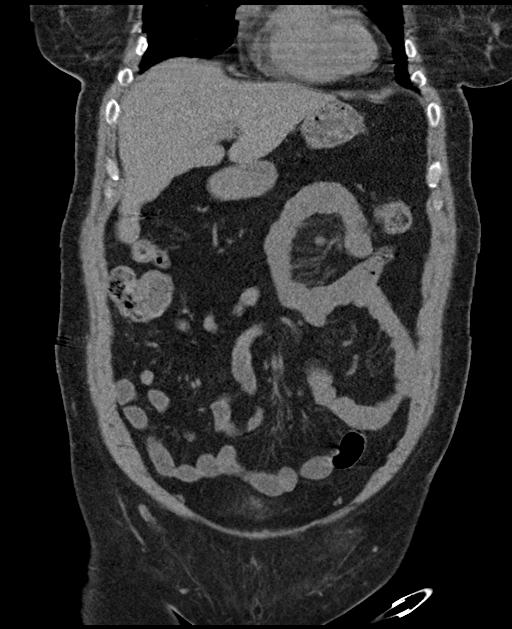
[im 44/98  soft-tissue]
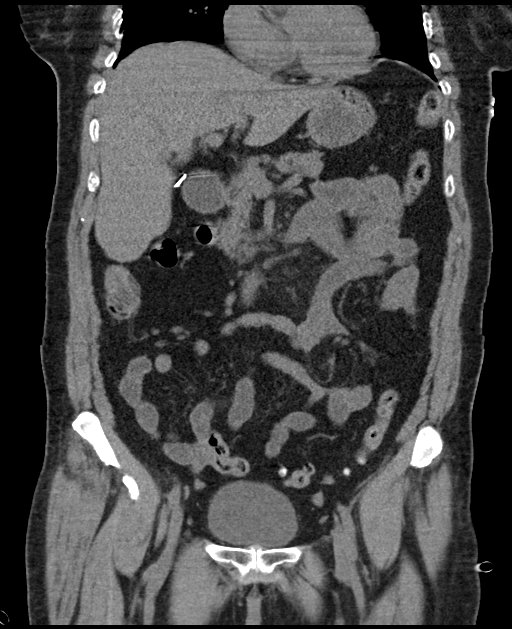
[im 54/98  soft-tissue]
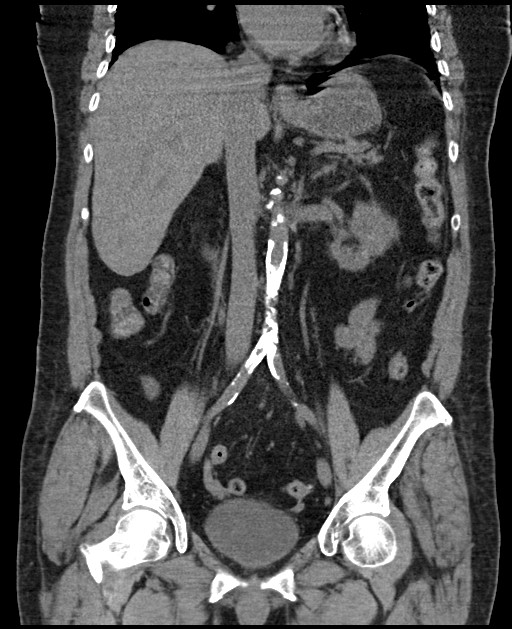

[16 of 46 positions shown; findings below may reference images not displayed]

FINDINGS: Lower chest: Lung bases demonstrate no acute consolidation or
pleural effusion. Normal heart size.

Hepatobiliary: No focal liver abnormality is seen. Status post
cholecystectomy. No biliary dilatation.

Pancreas: Unremarkable. No pancreatic ductal dilatation or
surrounding inflammatory changes.

Spleen: Normal in size without focal abnormality.

Adrenals/Urinary Tract: Adrenal glands are within normal limits. No
hydronephrosis. No ureteral stone. Nonspecific perinephric fat
stranding. The bladder is within normal limits

Stomach/Bowel: Stomach is nonenlarged. No dilated small bowel. No
colon wall thickening. Appendix not confidently identified but no
right lower quadrant inflammation. Extensive sigmoid colon
diverticula without acute disease.

Vascular/Lymphatic: Moderate severe aortic atherosclerosis. No
aneurysmal dilatation. No significantly enlarged lymph nodes.

Reproductive: Status post hysterectomy. No adnexal masses.

Other: Negative for free air or free fluid. Small fat in the left
inguinal region. Mild hazy appearance of the central mesentery with
small scattered lymph nodes measuring up to 5 mm.

Musculoskeletal: Marked degenerative change at L5-S1. No acute or
suspicious bone lesion.
IMPRESSION: 1. No definite CT evidence for acute intra-abdominal or pelvic
abnormality. Negative for bowel obstruction or bowel wall
thickening.
2. Minimal haziness of the central mesentery with a few small lymph
nodes present, could be due to nonspecific mesenteric edema or
possibly panniculitis.

## 2018-05-01 NOTE — Telephone Encounter (Signed)
Received fax from [employer], renewal application has been APPROVED. Coverage is from 07/05/2018 to 07/05/2019.  Will send document to scan Center.  Phone# [phone] Fax# [phone]  8:36 AM Beatriz Chancellor, CPhT

## 2018-05-01 NOTE — Progress Notes (Signed)
Office Visit Note  Patient: Annette Suarez             Date of Birth: 08-22-1940           MRN: [id number]             PCP: Shirline Frees, MD Referring: Shirline Frees, MD Visit Date: 05/15/2018 Occupation: @GUAROCC @  Subjective:  Medication management.  History of Present Illness: Annette Suarez is a 77 y.o. female with history of rheumatoid arthritis and osteoarthritis.   Patient is currently taking Enbrel 50 mg sureclick every other week.  She had been off methotrexate for the last few months.  She held one dose of Enbrel last week due to having a UTI and being on antibiotics.  She plans to resume Enbrel tomorrow.  She complained about the injection being very painful. Denies any joint pain, stiffness, or swelling. Reports having some squamous cell cancer and had them removed last week.  She has had her yearly flu shot and reports being up to date on pneumonia vaccines.    Activities of Daily Living:  Patient reports morning stiffness for 0 minutes.   Patient Denies nocturnal pain.  Difficulty dressing/grooming: Denies Difficulty climbing stairs: Denies Difficulty getting out of chair: Denies Difficulty using hands for taps, buttons, cutlery, and/or writing: Denies  Review of Systems  Constitutional: Positive for fatigue.  HENT: Negative for mouth sores, trouble swallowing, trouble swallowing and mouth dryness.   Eyes: Negative for pain, redness, itching and dryness.  Respiratory: Negative for shortness of breath, wheezing and difficulty breathing.   Cardiovascular: Negative for chest pain, palpitations and swelling in legs/feet.  Gastrointestinal: Negative for abdominal pain, constipation, diarrhea, nausea and vomiting.  Endocrine: Negative for increased urination.  Genitourinary: Negative for painful urination, nocturia and pelvic pain.  Musculoskeletal: Negative for arthralgias, joint pain, joint swelling and morning stiffness.  Skin: Negative for rash and hair loss.    Allergic/Immunologic: Negative for susceptible to infections.  Neurological: Negative for dizziness, light-headedness, headaches, memory loss and weakness.  Hematological: Negative for bruising/bleeding tendency.  Psychiatric/Behavioral: Negative for confusion. The patient is not nervous/anxious.     PMFS History:  Patient Active Problem List   Diagnosis Date Noted  . Primary osteoarthritis of both feet 12/13/2017  . Dehydration 09/09/2017  . Acute lower UTI 09/09/2017  . Nausea & vomiting 09/08/2017  . Primary osteoarthritis of both hands 04/05/2017  . Left carpal tunnel syndrome 11/15/2016  . Rheumatoid arthritis of multiple sites with negative rheumatoid factor ([place]) 10/06/2016  . High risk medication use 10/06/2016  . Dyslipidemia 10/06/2016  . Pain in both hands 09/08/2016  . Essential hypertension 09/08/2016  . Type 2 diabetes mellitus  09/08/2016  . Gastroesophageal reflux disease without esophagitis 09/08/2016  . Osteopenia of multiple sites 09/08/2016  . History of anxiety 09/08/2016  . Benign paroxysmal positional vertigo 09/08/2016  . Smoker 09/08/2016  . Sleep apnea 09/08/2016    Past Medical History:  Diagnosis Date  . Diabetes mellitus without complication ([place])   . Goiter, nontoxic, multinodular   . Hypertension   . RA (rheumatoid arthritis) (HCC)     Family History  Problem Relation Age of Onset  . Hypertension Other   . Diabetes Mother   . Heart disease Father   . Diabetes Brother   . Cancer Sister        breast   . Diabetes Son   . Crohn's disease Son    Past Surgical History:  Procedure Laterality Date  .  CHOLECYSTECTOMY    . SQUAMOUS CELL CARCINOMA EXCISION  05/2018   Social History   Social History Narrative  . Not on file    Objective: Vital Signs: BP 117/80 (BP Location: Left Arm, Patient Position: Sitting, Cuff Size: Normal)   Pulse 88   Resp 14   Ht 5\' 1"  (1.549 m)   Wt 155 lb 6.4 oz (70.5 kg)   BMI 29.36 kg/m    Physical  Exam  Constitutional: She is oriented to person, place, and time. She appears well-developed and well-nourished.  HENT:  Head: Normocephalic and atraumatic.  Eyes: Conjunctivae and EOM are normal.  Neck: Normal range of motion.  Cardiovascular: Normal rate, regular rhythm, normal heart sounds and intact distal pulses.  Pulmonary/Chest: Effort normal and breath sounds normal.  Abdominal: Soft. Bowel sounds are normal.  Lymphadenopathy:    She has no cervical adenopathy.  Neurological: She is alert and oriented to person, place, and time.  Skin: Skin is warm and dry. Capillary refill takes less than 2 seconds.  Psychiatric: She has a normal mood and affect. Her behavior is normal.  Nursing note and vitals reviewed.    Musculoskeletal Exam: C-spine thoracic lumbar spine good range of motion.  Shoulder joints elbow joints wrist joints were in good range of motion.  She has DIP and PIP thickening but no synovitis was noted.  Hip joints knee joints ankles MTPs PIPs were in good range of motion with no synovitis.  CDAI Exam: CDAI Score: 0.2  Patient Global Assessment: 1 (mm); Provider Global Assessment: 1 (mm) Swollen: 0 ; Tender: 0  Joint Exam   Not documented   There is currently no information documented on the homunculus. Go to the Rheumatology activity and complete the homunculus joint exam.  Investigation: No additional findings.  Imaging: No results found.  Recent Labs: Lab Results  Component Value Date   WBC 8.5 03/22/2018   HGB 13.4 03/22/2018   PLT 325 03/22/2018   NA 141 03/22/2018   K 5.5 (H) 03/22/2018   CL 106 03/22/2018   CO2 21 03/22/2018   GLUCOSE 166 (H) 03/22/2018   BUN 24 03/22/2018   CREATININE 1.14 (H) 03/22/2018   BILITOT 1.1 03/22/2018   ALKPHOS 56 09/09/2017   AST 14 03/22/2018   ALT 12 03/22/2018   PROT 7.1 03/22/2018   ALBUMIN 3.2 (L) 09/09/2017   CALCIUM 9.9 03/22/2018   GFRAA 54 (L) 03/22/2018   QFTBGOLDPLUS NEGATIVE 07/20/2017     Speciality Comments: No specialty comments available.  Procedures:  No procedures performed Allergies: Latex   Assessment / Plan:     Visit Diagnoses: Rheumatoid arthritis of multiple sites with negative rheumatoid factor (HCC)-patient has no synovitis on examination.  She has been off methotrexate for a long time.  She has been taking Enbrel every other week for last several months.  I have advised her to increase the interval between the Enbrel injections to every 3 weeks since she has a flare then she can go back to every 2 weeks.  High risk medication use -  Enbrel 50 mg sq q o wk.  Patient does not like the method of sure click.  She will be switching to Enbrel auto touch..  Demonstrated proper injection technique with  demo pen.  Patient able to demonstrate proper injection technique using the teach back method.  Prescription faxed to [employer] for autotouch device.  Her labs have been stable.  Her last labs showed decrease in GFR.  Which  probably was related to hydrochlorothiazide use.  She states she is not taking any NSAIDs.- Plan: QuantiFERON-TB Gold Plus with her next labs.  She is also concerned about frequent skin cancer.  We had detailed discussion regarding different treatment options including Orencia.  At this point she does not want to change her treatment.  She will discuss it further with dermatologist.  Primary osteoarthritis of both hands-joint protection muscle strengthening was discussed.  Primary osteoarthritis of both feet-she is currently not having much discomfort in her feet.  Osteopenia of multiple sites-she is on calcium and vitamin D.  Resistive exercises were discussed.  Essential hypertension-her blood pressure is controlled.  Other medical problems are listed as follows:  History of vertigo  History of hyperlipidemia  History of sleep apnea  History of anxiety  Type 2 diabetes mellitus   Gastroesophageal reflux disease without  esophagitis  Former smoker   Recommend flu, Pneumovax 23, Prevnar 13, and Shingrix as indicated. Orders: No orders of the defined types were placed in this encounter.  No orders of the defined types were placed in this encounter.   Face-to-face time spent with patient was 30 minutes. Greater than 50% of time was spent in counseling and coordination of care.  Follow-Up Instructions: Return for Rheumatoid arthritis, Osteoarthritis.   Bo Merino, MD  Note - This record has been created using Editor, commissioning.  Chart creation errors have been sought, but may not always  have been located. Such creation errors do not reflect on  the standard of medical care.

## 2018-05-05 HISTORY — PX: SQUAMOUS CELL CARCINOMA EXCISION: SHX2433

## 2018-05-15 ENCOUNTER — Encounter: Payer: Self-pay | Admitting: Physician Assistant

## 2018-05-15 ENCOUNTER — Ambulatory Visit: Payer: Medicare Other | Admitting: Rheumatology

## 2018-05-15 VITALS — BP 117/80 | HR 88 | Resp 14 | Ht 61.0 in | Wt 155.4 lb

## 2018-05-15 DIAGNOSIS — M0609 Rheumatoid arthritis without rheumatoid factor, multiple sites: Secondary | ICD-10-CM | POA: Diagnosis not present

## 2018-05-15 DIAGNOSIS — M19042 Primary osteoarthritis, left hand: Secondary | ICD-10-CM

## 2018-05-15 DIAGNOSIS — M19072 Primary osteoarthritis, left ankle and foot: Secondary | ICD-10-CM

## 2018-05-15 DIAGNOSIS — Z79899 Other long term (current) drug therapy: Secondary | ICD-10-CM | POA: Diagnosis not present

## 2018-05-15 DIAGNOSIS — Z794 Long term (current) use of insulin: Secondary | ICD-10-CM

## 2018-05-15 DIAGNOSIS — Z8639 Personal history of other endocrine, nutritional and metabolic disease: Secondary | ICD-10-CM

## 2018-05-15 DIAGNOSIS — M8589 Other specified disorders of bone density and structure, multiple sites: Secondary | ICD-10-CM

## 2018-05-15 DIAGNOSIS — E118 Type 2 diabetes mellitus with unspecified complications: Secondary | ICD-10-CM

## 2018-05-15 DIAGNOSIS — Z87891 Personal history of nicotine dependence: Secondary | ICD-10-CM

## 2018-05-15 DIAGNOSIS — Z8659 Personal history of other mental and behavioral disorders: Secondary | ICD-10-CM

## 2018-05-15 DIAGNOSIS — M19071 Primary osteoarthritis, right ankle and foot: Secondary | ICD-10-CM | POA: Diagnosis not present

## 2018-05-15 DIAGNOSIS — Z8669 Personal history of other diseases of the nervous system and sense organs: Secondary | ICD-10-CM

## 2018-05-15 DIAGNOSIS — I10 Essential (primary) hypertension: Secondary | ICD-10-CM

## 2018-05-15 DIAGNOSIS — Z87898 Personal history of other specified conditions: Secondary | ICD-10-CM

## 2018-05-15 DIAGNOSIS — K219 Gastro-esophageal reflux disease without esophagitis: Secondary | ICD-10-CM

## 2018-05-15 DIAGNOSIS — M19041 Primary osteoarthritis, right hand: Secondary | ICD-10-CM | POA: Diagnosis not present

## 2018-05-15 MED ORDER — ETANERCEPT 50 MG/ML ~~LOC~~ SOCT: 50.0000 mg | SUBCUTANEOUS | 0 refills | Status: DC

## 2018-05-15 NOTE — Telephone Encounter (Signed)
Patient is switching to Enbrel Mini device.  Received a Prior Authorization request from [employer] for Enbrel Mini. Authorization has been submitted to patient's insurance via Cover My Meds. Will update once we receive a response.  11:34 AM Beatriz Chancellor, CPhT

## 2018-05-15 NOTE — Patient Instructions (Addendum)
Standing Labs We placed an order today for your standing lab work.    Please come back and get your standing labs in December and then every 3 months. Please get TB Gold with your next blood work  We have open lab Monday through Friday from 8:30-11:30 AM and 1:30-4:00 PM  at the office of Dr. Bo Merino.   You may experience shorter wait times on Monday and Friday afternoons. The office is located at [address] [place], [place], [place] [address] No appointment is necessary.   Labs are drawn by [facility].  You may receive a bill from [facility] for your lab work. If you have any questions regarding directions or hours of operation,  please call [phone].   Just as a reminder please drink plenty of water prior to coming for your lab work. Thanks!   Vaccines You are taking a medication(s) that can suppress your immune system.  The following immunizations are recommended: . Flu annually . Pneumonia (Pneumovax 25 and Prevnar 13 after age 11) . Shingrix  Please check with your PCP to make sure you are up to date.  In addition to staying up to date on lab work and vaccines it is important to:  Marland Kitchen Yearly skin checks with dermatologist due to increase risk in skin cancer with TNF inhibitors .

## 2018-05-15 NOTE — Addendum Note (Signed)
Addended by: Mariella Saa C on: 05/15/2018 03:58 PM   Modules accepted: Orders

## 2018-05-15 NOTE — Telephone Encounter (Signed)
error 

## 2018-05-15 NOTE — Telephone Encounter (Signed)
Received a fax from [employer] regarding a prior authorization for Enbrel Mini. Authorization has been APPROVED from 05/15/2018 to 07/05/2019.   Will send document to scan center.  Authorization # [id number] Phone # [phone]  11:38 AM Beatriz Chancellor, CPhT

## 2018-05-27 IMAGING — US US ABDOMEN LIMITED
1 series · 14 of 25 positions shown · non-contrast
Comparison: CT 09/08/2017

CLINICAL DATA: Right upper quadrant pain with nausea vomiting and
diarrhea

EXAM:
ULTRASOUND ABDOMEN LIMITED RIGHT UPPER QUADRANT

[Series 1: us abdomen limited · 0.28mm/px · 14 of 32 slices shown]
[im 1/32]
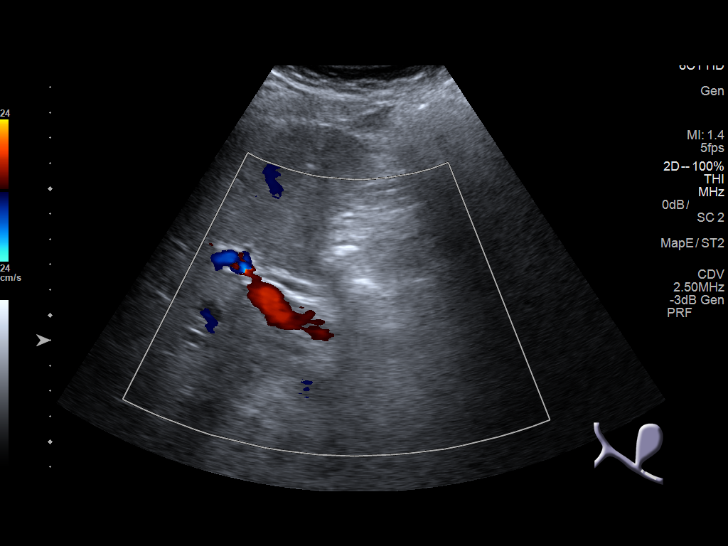
[im 3/32]
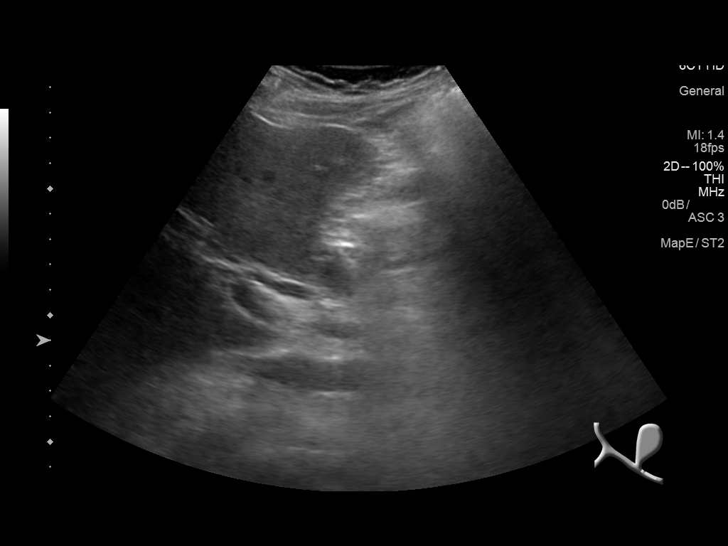
[im 6/32]
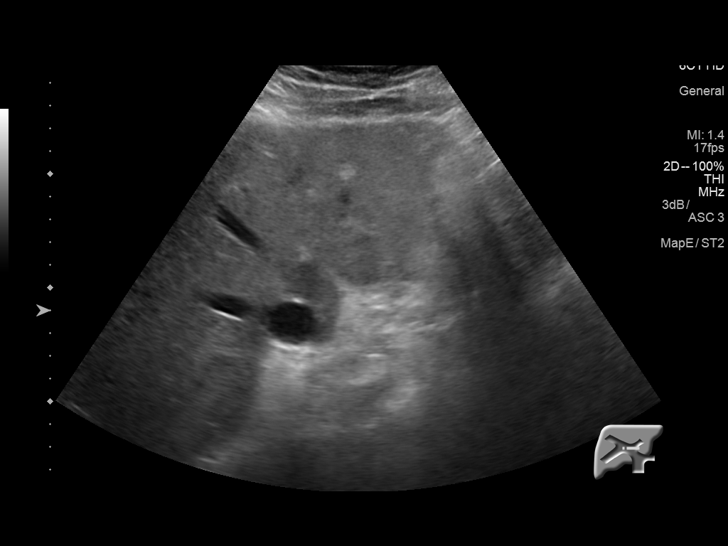
[im 8/32]
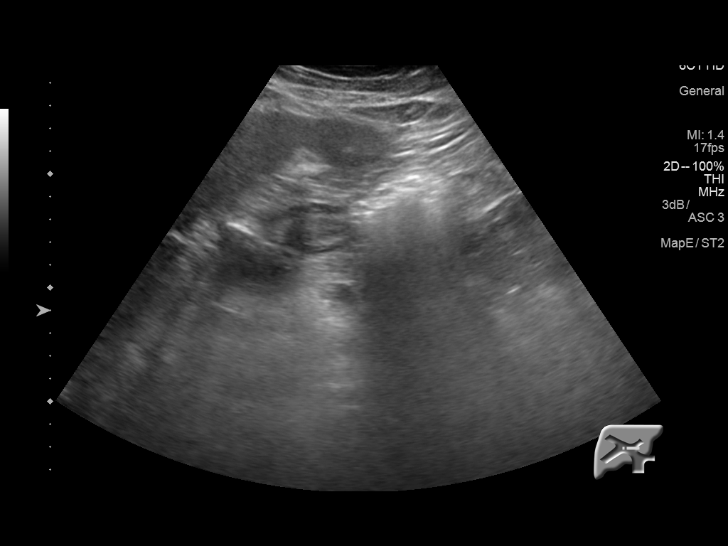
[im 11/32]
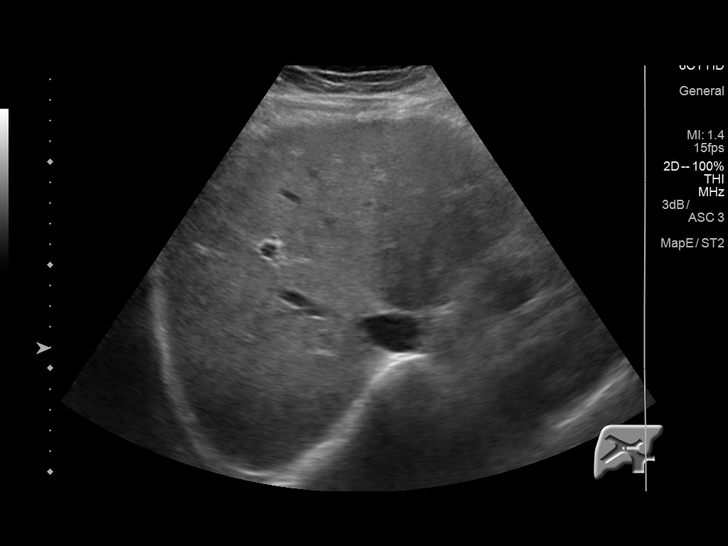
[im 12/32]
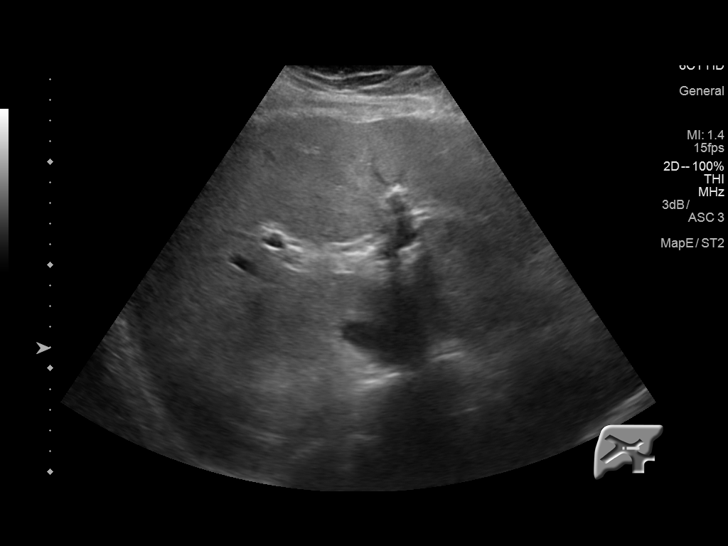
[im 15/32]
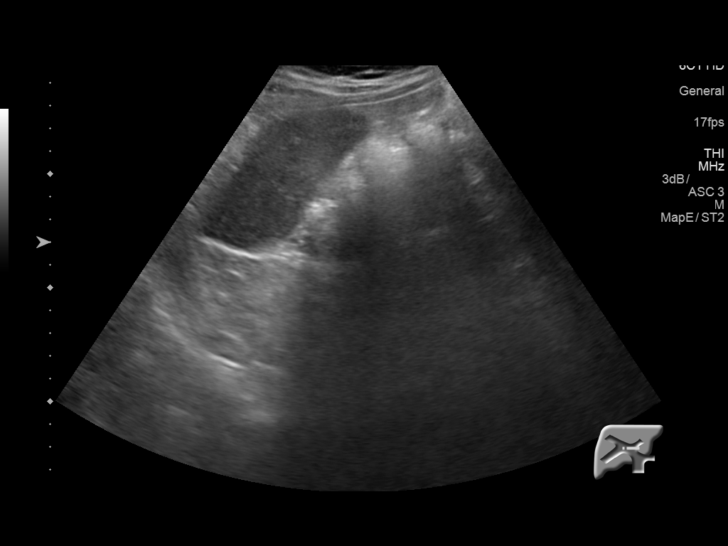
[im 17/32]
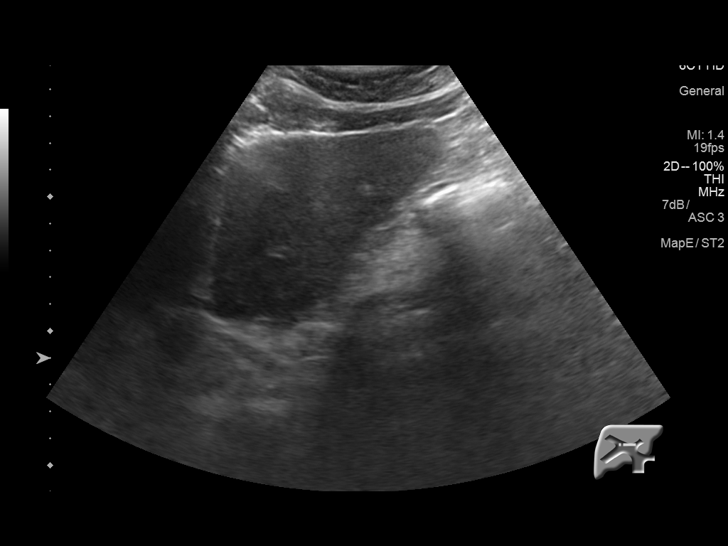
[im 20/32]
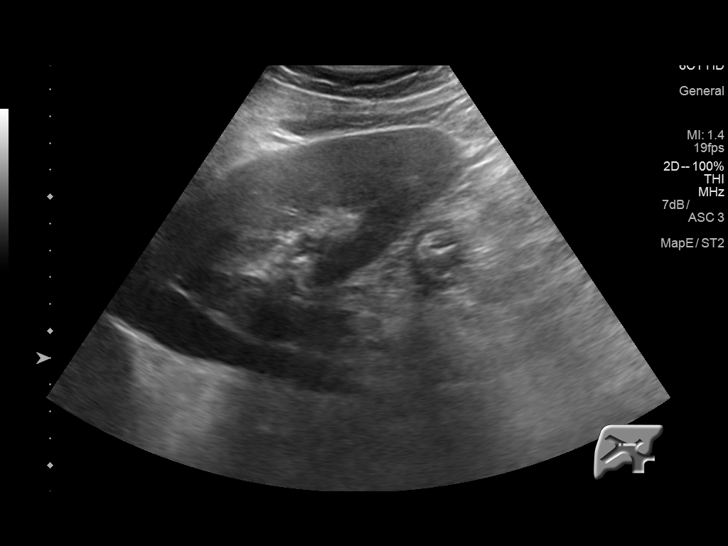
[im 21/32]
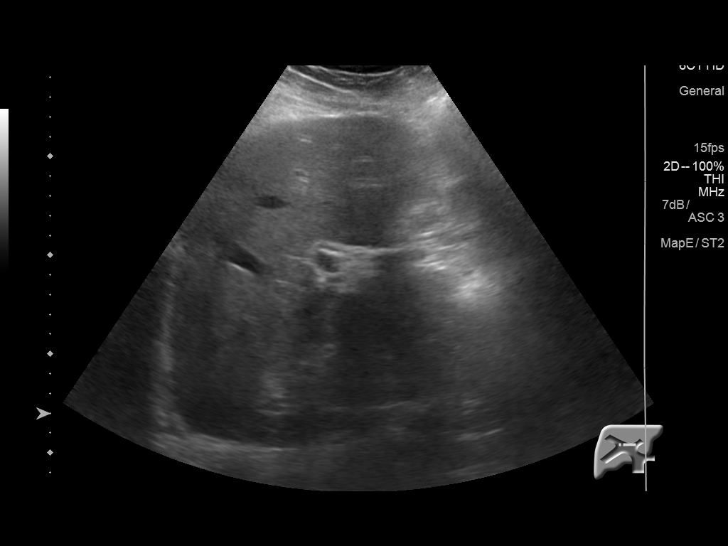
[im 24/32]
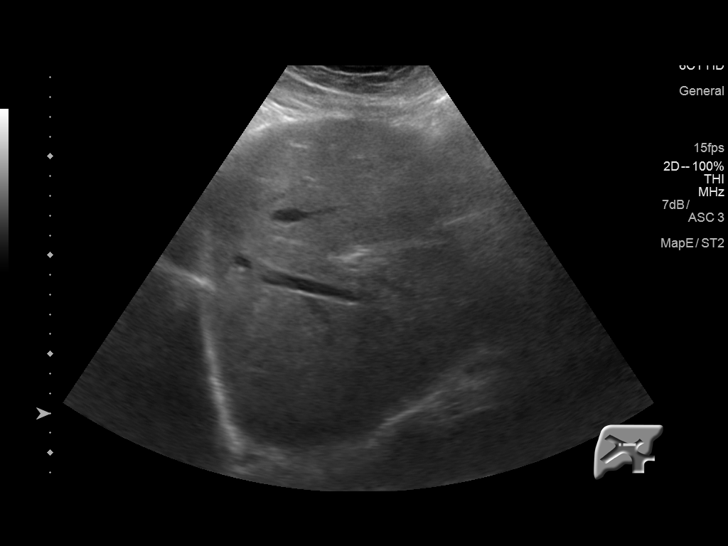
[im 26/32]
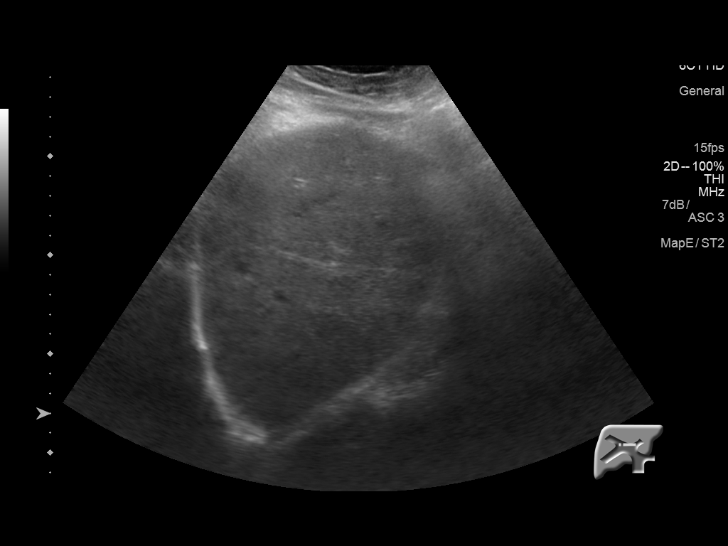
[im 29/32]
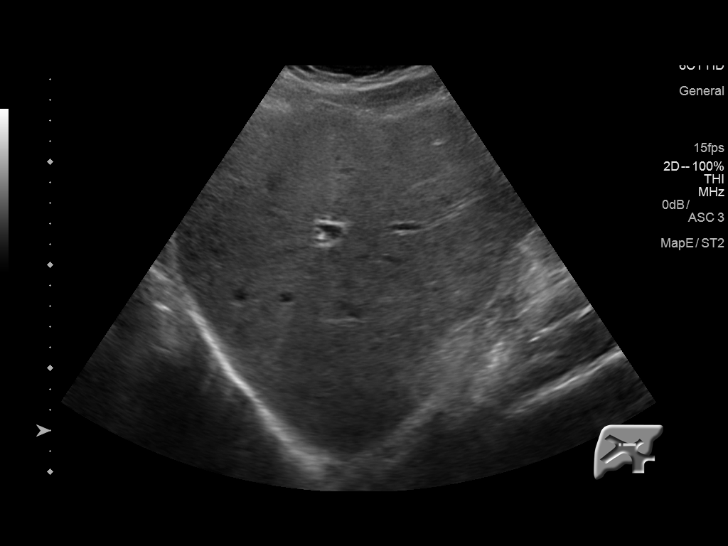
[im 32/32]
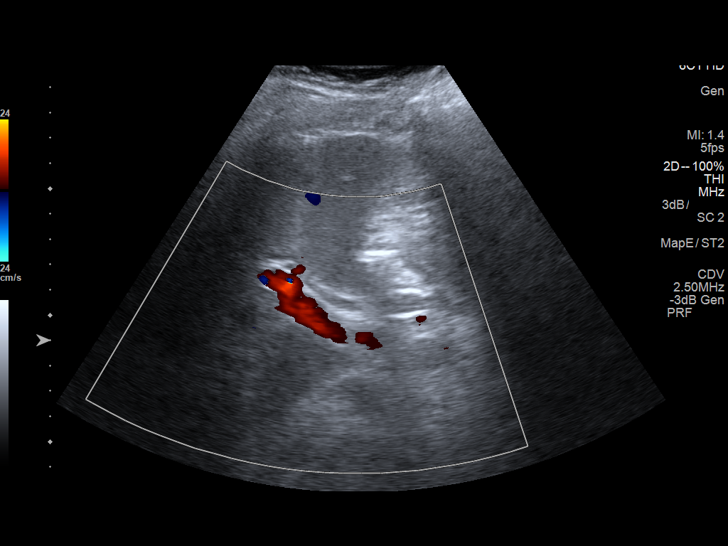

[14 of 25 positions shown; findings below may reference images not displayed]

FINDINGS: Gallbladder:

Surgically absent

Common bile duct:

Diameter: 5.6 mm

Liver:

No focal lesion identified. Within normal limits in parenchymal
echogenicity. Portal vein is patent on color Doppler imaging with
normal direction of blood flow towards the liver.
IMPRESSION: 1. Surgical absence of the gallbladder.  No biliary dilatation
2. Otherwise negative right upper quadrant abdominal ultrasound

## 2018-06-13 ENCOUNTER — Other Ambulatory Visit: Payer: Self-pay

## 2018-06-13 DIAGNOSIS — Z79899 Other long term (current) drug therapy: Secondary | ICD-10-CM

## 2018-06-14 NOTE — Progress Notes (Signed)
Labs are stable. Glu is elevated.

## 2018-06-16 LAB — CBC WITH DIFFERENTIAL/PLATELET
Basophils Absolute: 95 cells/uL (ref 0–200)
Basophils Relative: 0.9 %
Eosinophils Absolute: 223 cells/uL (ref 15–500)
Eosinophils Relative: 2.1 %
HCT: 44.8 % (ref 35.0–45.0)
Hemoglobin: 14.9 g/dL (ref 11.7–15.5)
Lymphs Abs: 1813 cells/uL (ref 850–3900)
MCH: 30.5 pg (ref 27.0–33.0)
MCHC: 33.3 g/dL (ref 32.0–36.0)
MCV: 91.6 fL (ref 80.0–100.0)
MPV: 10.4 fL (ref 7.5–12.5)
Monocytes Relative: 5.4 %
Neutro Abs: 7897 cells/uL — ABNORMAL HIGH (ref 1500–7800)
Neutrophils Relative %: 74.5 %
Platelets: 357 10*3/uL (ref 140–400)
RBC: 4.89 10*6/uL (ref 3.80–5.10)
RDW: 11.9 % (ref 11.0–15.0)
Total Lymphocyte: 17.1 %
WBC mixed population: 572 cells/uL (ref 200–950)
WBC: 10.6 10*3/uL (ref 3.8–10.8)

## 2018-06-16 LAB — COMPLETE METABOLIC PANEL WITH GFR
AG Ratio: 1.8 (calc) (ref 1.0–2.5)
ALT: 13 U/L (ref 6–29)
AST: 14 U/L (ref 10–35)
Albumin: 4.7 g/dL (ref 3.6–5.1)
Alkaline phosphatase (APISO): 111 U/L (ref 33–130)
BUN/Creatinine Ratio: 18 (calc) (ref 6–22)
BUN: 19 mg/dL (ref 7–25)
CO2: 25 mmol/L (ref 20–32)
Calcium: 9.5 mg/dL (ref 8.6–10.4)
Chloride: 102 mmol/L (ref 98–110)
Creat: 1.05 mg/dL — ABNORMAL HIGH (ref 0.60–0.93)
GFR, Est African American: 60 mL/min/{1.73_m2} (ref >=60)
GFR, Est Non African American: 52 mL/min/{1.73_m2} — ABNORMAL LOW (ref >=60)
Globulin: 2.6 g/dL (calc) (ref 1.9–3.7)
Glucose, Bld: 293 mg/dL — ABNORMAL HIGH (ref 65–99)
Potassium: 4.5 mmol/L (ref 3.5–5.3)
Sodium: 137 mmol/L (ref 135–146)
Total Bilirubin: 1 mg/dL (ref 0.2–1.2)
Total Protein: 7.3 g/dL (ref 6.1–8.1)

## 2018-06-16 LAB — QUANTIFERON-TB GOLD PLUS
Mitogen-NIL: >10 IU/mL
NIL: 0.02 IU/mL
QuantiFERON-TB Gold Plus: NEGATIVE
TB1-NIL: 0 IU/mL
TB2-NIL: 0 IU/mL

## 2018-07-10 ENCOUNTER — Telehealth: Payer: Self-pay | Admitting: *Deleted

## 2018-07-10 NOTE — Telephone Encounter (Signed)
Patient states she was started on 2 different antibiotics prescribed by her PCP. Patient asked what to do about her Enbrel injection. Patient was advised to hold her Enbrel until she has completed antibiotics and is well. Patient asked if she were to need Diflucan due to a yeast infection from the antibiotics would she need to hold her Enbrel. Patient advised if it more than the standard dose of Diflucan she will need to holed her Enbrel until yeast infection clears. Patient advised if it is the standard dose okay to resume Enbrel .

## 2018-09-22 ENCOUNTER — Other Ambulatory Visit: Payer: Self-pay | Admitting: *Deleted

## 2018-09-22 DIAGNOSIS — M0609 Rheumatoid arthritis without rheumatoid factor, multiple sites: Secondary | ICD-10-CM

## 2018-09-22 MED ORDER — ETANERCEPT 50 MG/ML ~~LOC~~ SOCT: 50.0000 mg | SUBCUTANEOUS | 0 refills | Status: DC

## 2018-09-22 NOTE — Telephone Encounter (Signed)
Last Visit: 05/15/18 Next Visit: 10/16/18 Labs: 06/13/18 Stable, elevated glucose TB Gold: 06/13/18 Neg   Okay to refill per Dr. Estanislado Pandy

## 2018-10-03 ENCOUNTER — Telehealth: Payer: Self-pay | Admitting: *Deleted

## 2018-10-03 NOTE — Telephone Encounter (Signed)
Patient called to see if she was due for labs. Patient advised she is due for labs but with the circumstances  Of COVID -19 we can postpone them until May 2020 as she does not want to come out of the house. Patient verbalized understanding.

## 2018-10-16 ENCOUNTER — Ambulatory Visit: Payer: Medicare Other | Admitting: Rheumatology

## 2018-11-23 ENCOUNTER — Telehealth: Payer: Self-pay | Admitting: *Deleted

## 2018-11-23 NOTE — Progress Notes (Signed)
Office Visit Note  Patient: Annette Suarez             Date of Birth: [birth year]           MRN: [id number]             PCP: Shirline Frees, MD Referring: Shirline Frees, MD Visit Date: 12/06/2018 Occupation: @GUAROCC @  Subjective:  Right trochanteric bursitis     History of Present Illness: Annette Suarez is a 78 y.o. female with history of seronegative rheumatoid arthritis and osteoarthritis.  She is on Enbrel 50 mg every 14 days.  She denies any recent rheumatoid arthritis flares.  She denies any hand pain or joint swelling.  She has noticed decreased grip strength. She has been experiencing right trochanteric bursitis and lower back pain if she walks prolonged distances.  She also has pain in both knee joints if she is getting up from a chair.  Activities of Daily Living:  Patient reports morning stiffness for 0 minutes.   Patient Reports nocturnal pain.  Difficulty dressing/grooming: Denies Difficulty climbing stairs: Denies Difficulty getting out of chair: Reports Difficulty using hands for taps, buttons, cutlery, and/or writing: Reports  Review of Systems  Constitutional: Positive for fatigue.  HENT: Negative for mouth sores, mouth dryness and nose dryness.   Eyes: Negative for pain, itching, visual disturbance and dryness.  Respiratory: Negative for cough, hemoptysis, shortness of breath, wheezing and difficulty breathing.   Cardiovascular: Negative for chest pain, palpitations, hypertension and swelling in legs/feet.  Gastrointestinal: Positive for constipation and vomiting. Negative for abdominal pain, blood in stool and diarrhea.  Endocrine: Negative for increased urination.  Genitourinary: Negative for painful urination and pelvic pain.  Musculoskeletal: Positive for arthralgias, joint pain and joint swelling. Negative for myalgias, muscle weakness, morning stiffness, muscle tenderness and myalgias.  Skin: Negative for color change, pallor, rash, hair loss,  nodules/bumps, redness, skin tightness, ulcers and sensitivity to sunlight.  Allergic/Immunologic: Negative for susceptible to infections.  Neurological: Negative for dizziness, light-headedness, headaches and memory loss.  Hematological: Negative for swollen glands.  Psychiatric/Behavioral: Negative for depressed mood, confusion and sleep disturbance. The patient is not nervous/anxious.     PMFS History:  Patient Active Problem List   Diagnosis Date Noted  . Primary osteoarthritis of both feet 12/13/2017  . Dehydration 09/09/2017  . Acute lower UTI 09/09/2017  . Nausea & vomiting 09/08/2017  . Primary osteoarthritis of both hands 04/05/2017  . Left carpal tunnel syndrome 11/15/2016  . Rheumatoid arthritis of multiple sites with negative rheumatoid factor ([place]) 10/06/2016  . High risk medication use 10/06/2016  . Dyslipidemia 10/06/2016  . Pain in both hands 09/08/2016  . Essential hypertension 09/08/2016  . Type 2 diabetes mellitus  09/08/2016  . Gastroesophageal reflux disease without esophagitis 09/08/2016  . Osteopenia of multiple sites 09/08/2016  . History of anxiety 09/08/2016  . Benign paroxysmal positional vertigo 09/08/2016  . Smoker 09/08/2016  . Sleep apnea 09/08/2016    Past Medical History:  Diagnosis Date  . Diabetes mellitus without complication ([place])   . Goiter, nontoxic, multinodular   . Hypertension   . RA (rheumatoid arthritis) ([facility])     Family History  Problem Relation Age of Onset  . Hypertension Other   . Diabetes Mother   . Heart disease Father   . Diabetes Brother   . Cancer Sister        breast   . Diabetes Son   . Crohn's disease Son    Past Surgical History:  Procedure Laterality Date  . CHOLECYSTECTOMY    . SQUAMOUS CELL CARCINOMA EXCISION  05/2018   Social History   Social History Narrative  . Not on file    There is no immunization history on file for this patient.   Objective: Vital Signs: BP 124/81 (BP Location: Left Arm,  Patient Position: Sitting, Cuff Size: Normal)   Pulse 80   Resp 13   Ht 5\' 1"  (1.549 m)   Wt 161 lb 12.8 oz (73.4 kg)   BMI 30.57 kg/m    Physical Exam Vitals signs and nursing note reviewed.  Constitutional:      Appearance: She is well-developed.  HENT:     Head: Normocephalic and atraumatic.  Eyes:     Conjunctiva/sclera: Conjunctivae normal.  Neck:     Musculoskeletal: Normal range of motion.  Cardiovascular:     Rate and Rhythm: Normal rate and regular rhythm.     Heart sounds: Normal heart sounds.  Pulmonary:     Effort: Pulmonary effort is normal.     Breath sounds: Normal breath sounds.  Abdominal:     General: Bowel sounds are normal.     Palpations: Abdomen is soft.  Lymphadenopathy:     Cervical: No cervical adenopathy.  Skin:    General: Skin is warm and dry.     Capillary Refill: Capillary refill takes less than 2 seconds.  Neurological:     Mental Status: She is alert and oriented to person, place, and time.  Psychiatric:        Behavior: Behavior normal.      Musculoskeletal Exam: C-spine, thoracic spine, and lumbar spine good ROM.  No midline spinal tenderness.  No SI joint tenderness.  Shoulder joints, elbow joints, wrist joints, MCPs, PIPs, and DIPs good ROM with no synovitis.  PIP and DIP synovial thickening consistent with osteoarthritis of both hands.  Bilateral CMC joint synovial thickening.  Hip joints, knee joints, ankle joints, MTPs, PIPs, and DIPs good ROM with no synovitis.  No warmth or effusion of knee joints.  No tenderness or swelling of ankle joints.  1st MTP joint synovial thickening.    CDAI Exam: CDAI Score: Not documented Patient Global Assessment: Not documented; Provider Global Assessment: Not documented Swollen: Not documented; Tender: Not documented Joint Exam   Not documented   There is currently no information documented on the homunculus. Go to the Rheumatology activity and complete the homunculus joint exam.   Investigation: No additional findings.  Imaging: No results found.  Recent Labs: Lab Results  Component Value Date   WBC 10.6 06/13/2018   HGB 14.9 06/13/2018   PLT 357 06/13/2018   NA 137 06/13/2018   K 4.5 06/13/2018   CL 102 06/13/2018   CO2 25 06/13/2018   GLUCOSE 293 (H) 06/13/2018   BUN 19 06/13/2018   CREATININE 1.05 (H) 06/13/2018   BILITOT 1.0 06/13/2018   ALKPHOS 56 09/09/2017   AST 14 06/13/2018   ALT 13 06/13/2018   PROT 7.3 06/13/2018   ALBUMIN 3.2 (L) 09/09/2017   CALCIUM 9.5 06/13/2018   GFRAA 60 06/13/2018   QFTBGOLDPLUS NEGATIVE 06/13/2018    Speciality Comments: No specialty comments available.  Procedures:  No procedures performed Allergies: Latex   Assessment / Plan:     Visit Diagnoses: Rheumatoid arthritis of multiple sites with negative rheumatoid factor ([place]): She has no synovitis on exam.  She has not had any recent rheumatoid arthritis flares.  She is clinically doing well on Enbrel 50 mg  subcutaneous injections every 14 days.  She has not missed any doses recently.  She has chronic lower back and bilateral knee joint pain.  She was given a handout of exercises for her knee joints and lower back exercises.  She will continue on Enbrel 50 mg subcutaneous injection every 14 days.  She was advised to notify us that shows increased joint pain or joint swelling.  She declined x-rays of her hands and feet today.  She will follow-up in the office in 5 months.  High risk medication use - She is on Enbrel mini 50 mg every 14 days.  Last TB gold negative on 06/13/2018 and will monitor yearly.  Most recent CBC/CMP stable on 06/13/2018.  She is overdue for labs and CBC/CMP ordered for today will monitor every 3 months and standing orders placed.  - Plan: CBC with Differential/Platelet, COMPLETE METABOLIC PANEL WITH GFR, CBC with Differential/Platelet, COMPLETE METABOLIC PANEL WITH GFR  Primary osteoarthritis of both hands: She has PIP and DIP synovial  thickening consistent with osteoarthritis of bilateral hands.  She has bilateral CMC joint synovial thickening.  She has complete fist formation bilaterally.  No synovitis or inflammation was noted today.  She has noticed decreased grip strength bilaterally.  Joint protection and muscle strengthening were discussed.  Primary osteoarthritis of both feet: She has osteoarthritic changes in bilateral feet.  She has bilateral first MTP joint synovial thickening.  She has no discomfort in her feet at this time.  She was proper fitting shoes.  Trochanteric bursitis, right hip: She has tenderness over the right trochanter bursa today.  She experiences increased discomfort when she is lying on her right side and walking for long distances.  She was given a handout of exercises to perform at home.  Chronic pain of both knee joints: She is been experiencing increased discomfort in bilateral knee joints especially when walking for long distances.  No warmth or effusion was noted.  She has good range of motion on exam.  She was given a handout of knee exercises to perform at home.  She declined knee x-rays today.  Chronic midline low back pain without sciatica: She has been experiencing increased lower back pain especially when standing for prolonged periods of time.  She denied any symptoms of sciatica.  She has no midline spinal tenderness at this time.  Most of her lower back pain seems to be muscular.  She declined a x-rays of her lower back.  She was given a handout of back exercises to perform.  Other medical conditions are listed as follows:  Osteopenia of multiple sites  Essential hypertension  History of vertigo  History of hyperlipidemia  History of sleep apnea  History of anxiety  Type 2 diabetes mellitus   Gastroesophageal reflux disease without esophagitis  Former smoker    Orders: Orders Placed This Encounter  Procedures  . CBC with Differential/Platelet  . COMPLETE METABOLIC  PANEL WITH GFR  . CBC with Differential/Platelet  . COMPLETE METABOLIC PANEL WITH GFR   No orders of the defined types were placed in this encounter.     Follow-Up Instructions: Return in about 5 months (around 05/08/2019) for Rheumatoid arthritis, Osteoarthritis.   Ofilia Neas, PA-C  Note - This record has been created using Dragon software.  Chart creation errors have been sought, but may not always  have been located. Such creation errors do not reflect on  the standard of medical care.

## 2018-11-23 NOTE — Telephone Encounter (Signed)
Received a call from the patient asking if she could wait and just have her labs drawn the day of her appointment on 12/06/18. Called patient to advised she may wait until her appointment to have her labs drawn. Patient verbalized understanding.

## 2018-12-06 ENCOUNTER — Ambulatory Visit: Payer: Medicare Other | Admitting: Physician Assistant

## 2018-12-06 ENCOUNTER — Encounter: Payer: Self-pay | Admitting: Physician Assistant

## 2018-12-06 ENCOUNTER — Other Ambulatory Visit: Payer: Self-pay

## 2018-12-06 VITALS — BP 124/81 | HR 80 | Resp 13 | Ht 61.0 in | Wt 161.8 lb

## 2018-12-06 DIAGNOSIS — M19041 Primary osteoarthritis, right hand: Secondary | ICD-10-CM

## 2018-12-06 DIAGNOSIS — M19071 Primary osteoarthritis, right ankle and foot: Secondary | ICD-10-CM | POA: Diagnosis not present

## 2018-12-06 DIAGNOSIS — Z794 Long term (current) use of insulin: Secondary | ICD-10-CM

## 2018-12-06 DIAGNOSIS — G8929 Other chronic pain: Secondary | ICD-10-CM

## 2018-12-06 DIAGNOSIS — M8589 Other specified disorders of bone density and structure, multiple sites: Secondary | ICD-10-CM

## 2018-12-06 DIAGNOSIS — Z87891 Personal history of nicotine dependence: Secondary | ICD-10-CM

## 2018-12-06 DIAGNOSIS — M0609 Rheumatoid arthritis without rheumatoid factor, multiple sites: Secondary | ICD-10-CM

## 2018-12-06 DIAGNOSIS — M25562 Pain in left knee: Secondary | ICD-10-CM

## 2018-12-06 DIAGNOSIS — Z79899 Other long term (current) drug therapy: Secondary | ICD-10-CM

## 2018-12-06 DIAGNOSIS — Z8639 Personal history of other endocrine, nutritional and metabolic disease: Secondary | ICD-10-CM

## 2018-12-06 DIAGNOSIS — Z8659 Personal history of other mental and behavioral disorders: Secondary | ICD-10-CM

## 2018-12-06 DIAGNOSIS — Z87898 Personal history of other specified conditions: Secondary | ICD-10-CM

## 2018-12-06 DIAGNOSIS — M545 Low back pain, unspecified: Secondary | ICD-10-CM

## 2018-12-06 DIAGNOSIS — Z8669 Personal history of other diseases of the nervous system and sense organs: Secondary | ICD-10-CM

## 2018-12-06 DIAGNOSIS — M19042 Primary osteoarthritis, left hand: Secondary | ICD-10-CM

## 2018-12-06 DIAGNOSIS — K219 Gastro-esophageal reflux disease without esophagitis: Secondary | ICD-10-CM

## 2018-12-06 DIAGNOSIS — E118 Type 2 diabetes mellitus with unspecified complications: Secondary | ICD-10-CM

## 2018-12-06 DIAGNOSIS — M19072 Primary osteoarthritis, left ankle and foot: Secondary | ICD-10-CM

## 2018-12-06 DIAGNOSIS — M25561 Pain in right knee: Secondary | ICD-10-CM

## 2018-12-06 DIAGNOSIS — M7061 Trochanteric bursitis, right hip: Secondary | ICD-10-CM

## 2018-12-06 DIAGNOSIS — I10 Essential (primary) hypertension: Secondary | ICD-10-CM

## 2018-12-06 NOTE — Patient Instructions (Signed)
Back Exercises If you have pain in your back, do these exercises 2-3 times each day or as told by your doctor. When the pain goes away, do the exercises once each day, but repeat the steps more times for each exercise (do more repetitions). If you do not have pain in your back, do these exercises once each day or as told by your doctor. Exercises Single Knee to Chest Do these steps 3-5 times in a row for each leg: 1. Lie on your back on a firm bed or the floor with your legs stretched out. 2. Bring one knee to your chest. 3. Hold your knee to your chest by grabbing your knee or thigh. 4. Pull on your knee until you feel a gentle stretch in your lower back. 5. Keep doing the stretch for 10-30 seconds. 6. Slowly let go of your leg and straighten it. Pelvic Tilt Do these steps 5-10 times in a row: 1. Lie on your back on a firm bed or the floor with your legs stretched out. 2. Bend your knees so they point up to the ceiling. Your feet should be flat on the floor. 3. Tighten your lower belly (abdomen) muscles to press your lower back against the floor. This will make your tailbone point up to the ceiling instead of pointing down to your feet or the floor. 4. Stay in this position for 5-10 seconds while you gently tighten your muscles and breathe evenly. Cat-Cow Do these steps until your lower back bends more easily: 1. Get on your hands and knees on a firm surface. Keep your hands under your shoulders, and keep your knees under your hips. You may put padding under your knees. 2. Let your head hang down, and make your tailbone point down to the floor so your lower back is round like the back of a cat. 3. Stay in this position for 5 seconds. 4. Slowly lift your head and make your tailbone point up to the ceiling so your back hangs low (sags) like the back of a cow. 5. Stay in this position for 5 seconds.  Press-Ups Do these steps 5-10 times in a row: 1. Lie on your belly (face-down) on the  floor. 2. Place your hands near your head, about shoulder-width apart. 3. While you keep your back relaxed and keep your hips on the floor, slowly straighten your arms to raise the top half of your body and lift your shoulders. Do not use your back muscles. To make yourself more comfortable, you may change where you place your hands. 4. Stay in this position for 5 seconds. 5. Slowly return to lying flat on the floor.  Bridges Do these steps 10 times in a row: 1. Lie on your back on a firm surface. 2. Bend your knees so they point up to the ceiling. Your feet should be flat on the floor. 3. Tighten your butt muscles and lift your butt off of the floor until your waist is almost as high as your knees. If you do not feel the muscles working in your butt and the back of your thighs, slide your feet 1-2 inches farther away from your butt. 4. Stay in this position for 3-5 seconds. 5. Slowly lower your butt to the floor, and let your butt muscles relax. If this exercise is too easy, try doing it with your arms crossed over your chest. Belly Crunches Do these steps 5-10 times in a row: 1. Lie on your back on a  firm bed or the floor with your legs stretched out. 2. Bend your knees so they point up to the ceiling. Your feet should be flat on the floor. 3. Cross your arms over your chest. 4. Tip your chin a little bit toward your chest but do not bend your neck. 5. Tighten your belly muscles and slowly raise your chest just enough to lift your shoulder blades a tiny bit off of the floor. 6. Slowly lower your chest and your head to the floor. Back Lifts Do these steps 5-10 times in a row: 1. Lie on your belly (face-down) with your arms at your sides, and rest your forehead on the floor. 2. Tighten the muscles in your legs and your butt. 3. Slowly lift your chest off of the floor while you keep your hips on the floor. Keep the back of your head in line with the curve in your back. Look at the floor  while you do this. 4. Stay in this position for 3-5 seconds. 5. Slowly lower your chest and your face to the floor. Contact a doctor if:  Your back pain gets a lot worse when you do an exercise.  Your back pain does not lessen 2 hours after you exercise. If you have any of these problems, stop doing the exercises. Do not do them again unless your doctor says it is okay. Get help right away if:  You have sudden, very bad back pain. If this happens, stop doing the exercises. Do not do them again unless your doctor says it is okay. This information is not intended to replace advice given to you by your health care provider. Make sure you discuss any questions you have with your health care provider. Document Released: 07/24/2010 Document Revised: 03/15/2018 Document Reviewed: 08/15/2014 Elsevier Interactive Patient Education  2019 [employer]. Knee Exercises Ask your health care provider which exercises are safe for you. Do exercises exactly as told by your health care provider and adjust them as directed. It is normal to feel mild stretching, pulling, tightness, or discomfort as you do these exercises, but you should stop right away if you feel sudden pain or your pain gets worse.Do not begin these exercises until told by your health care provider. STRETCHING AND RANGE OF MOTION EXERCISES These exercises warm up your muscles and joints and improve the movement and flexibility of your knee. These exercises also help to relieve pain, numbness, and tingling. Exercise A: Knee Extension, Prone 1. Lie on your abdomen on a bed. 2. Place your left / right knee just beyond the edge of the surface so your knee is not on the bed. You can put a towel under your left / right thigh just above your knee for comfort. 3. Relax your leg muscles and allow gravity to straighten your knee. You should feel a stretch behind your left / right knee. 4. Hold this position for __________ seconds. 5. Scoot up so your  knee is supported between repetitions. Repeat __________ times. Complete this stretch __________ times a day. Exercise B: Knee Flexion, Active  1. Lie on your back with both knees straight. If this causes back discomfort, bend your left / right knee so your foot is flat on the floor. 2. Slowly slide your left / right heel back toward your buttocks until you feel a gentle stretch in the front of your knee or thigh. 3. Hold this position for __________ seconds. 4. Slowly slide your left / right heel back to the  starting position. Repeat __________ times. Complete this exercise __________ times a day. Exercise C: Quadriceps, Prone  1. Lie on your abdomen on a firm surface, such as a bed or padded floor. 2. Bend your left / right knee and hold your ankle. If you cannot reach your ankle or pant leg, loop a belt around your foot and grab the belt instead. 3. Gently pull your heel toward your buttocks. Your knee should not slide out to the side. You should feel a stretch in the front of your thigh and knee. 4. Hold this position for __________ seconds. Repeat __________ times. Complete this stretch __________ times a day. Exercise D: Hamstring, Supine 1. Lie on your back. 2. Loop a belt or towel over the ball of your left / right foot. The ball of your foot is on the walking surface, right under your toes. 3. Straighten your left / right knee and slowly pull on the belt to raise your leg until you feel a gentle stretch behind your knee. ? Do not let your left / right knee bend while you do this. ? Keep your other leg flat on the floor. 4. Hold this position for __________ seconds. Repeat __________ times. Complete this stretch __________ times a day. STRENGTHENING EXERCISES These exercises build strength and endurance in your knee. Endurance is the ability to use your muscles for a long time, even after they get tired. Exercise E: Quadriceps, Isometric  1. Lie on your back with your left / right  leg extended and your other knee bent. Put a rolled towel or small pillow under your knee if told by your health care provider. 2. Slowly tense the muscles in the front of your left / right thigh. You should see your kneecap slide up toward your hip or see increased dimpling just above the knee. This motion will push the back of the knee toward the floor. 3. For __________ seconds, keep the muscle as tight as you can without increasing your pain. 4. Relax the muscles slowly and completely. Repeat __________ times. Complete this exercise __________ times a day. Exercise F: Straight Leg Raises - Quadriceps 1. Lie on your back with your left / right leg extended and your other knee bent. 2. Tense the muscles in the front of your left / right thigh. You should see your kneecap slide up or see increased dimpling just above the knee. Your thigh may even shake a bit. 3. Keep these muscles tight as you raise your leg 4-6 inches (10-15 cm) off the floor. Do not let your knee bend. 4. Hold this position for __________ seconds. 5. Keep these muscles tense as you lower your leg. 6. Relax your muscles slowly and completely after each repetition. Repeat __________ times. Complete this exercise __________ times a day. Exercise G: Hamstring, Isometric 1. Lie on your back on a firm surface. 2. Bend your left / right knee approximately __________ degrees. 3. Dig your left / right heel into the surface as if you are trying to pull it toward your buttocks. Tighten the muscles in the back of your thighs to dig as hard as you can without increasing any pain. 4. Hold this position for __________ seconds. 5. Release the tension gradually and allow your muscles to relax completely for __________ seconds after each repetition. Repeat __________ times. Complete this exercise __________ times a day. Exercise H: Hamstring Curls  If told by your health care provider, do this exercise while wearing ankle weights. Begin with  __________ weights. Then increase the weight by 1 lb (0.5 kg) increments. Do not wear ankle weights that are more than __________. 1. Lie on your abdomen with your legs straight. 2. Bend your left / right knee as far as you can without feeling pain. Keep your hips flat against the floor. 3. Hold this position for __________ seconds. 4. Slowly lower your leg to the starting position.  Repeat __________ times. Complete this exercise __________ times a day. Exercise I: Squats (Quadriceps) 1. Stand in front of a table, with your feet and knees pointing straight ahead. You may rest your hands on the table for balance but not for support. 2. Slowly bend your knees and lower your hips like you are going to sit in a chair. ? Keep your weight over your heels, not over your toes. ? Keep your lower legs upright so they are parallel with the table legs. ? Do not let your hips go lower than your knees. ? Do not bend lower than told by your health care provider. ? If your knee pain increases, do not bend as low. 3. Hold the squat position for __________ seconds. 4. Slowly push with your legs to return to standing. Do not use your hands to pull yourself to standing. Repeat __________ times. Complete this exercise __________ times a day. Exercise J: Wall Slides (Quadriceps)  1. Lean your back against a smooth wall or door while you walk your feet out 18-24 inches (46-61 cm) from it. 2. Place your feet hip-width apart. 3. Slowly slide down the wall or door until your knees bend __________ degrees. Keep your knees over your heels, not over your toes. Keep your knees in line with your hips. 4. Hold for __________ seconds. Repeat __________ times. Complete this exercise __________ times a day. Exercise K: Straight Leg Raises - Hip Abductors 1. Lie on your side with your left / right leg in the top position. Lie so your head, shoulder, knee, and hip line up. You may bend your bottom knee to help you keep your  balance. 2. Roll your hips slightly forward so your hips are stacked directly over each other and your left / right knee is facing forward. 3. Leading with your heel, lift your top leg 4-6 inches (10-15 cm). You should feel the muscles in your outer hip lifting. ? Do not let your foot drift forward. ? Do not let your knee roll toward the ceiling. 4. Hold this position for __________ seconds. 5. Slowly return your leg to the starting position. 6. Let your muscles relax completely after each repetition. Repeat __________ times. Complete this exercise __________ times a day. Exercise L: Straight Leg Raises - Hip Extensors 1. Lie on your abdomen on a firm surface. You can put a pillow under your hips if that is more comfortable. 2. Tense the muscles in your buttocks and lift your left / right leg about 4-6 inches (10-15 cm). Keep your knee straight as you lift your leg. 3. Hold this position for __________ seconds. 4. Slowly lower your leg to the starting position. 5. Let your leg relax completely after each repetition. Repeat __________ times. Complete this exercise __________ times a day. This information is not intended to replace advice given to you by your health care provider. Make sure you discuss any questions you have with your health care provider. Document Released: 05/05/2005 Document Revised: 03/15/2016 Document Reviewed: 04/27/2015 Elsevier Interactive Patient Education  2018 [employer]. Trochanteric Bursitis Rehab Ask your health  care provider which exercises are safe for you. Do exercises exactly as told by your health care provider and adjust them as directed. It is normal to feel mild stretching, pulling, tightness, or discomfort as you do these exercises, but you should stop right away if you feel sudden pain or your pain gets worse.Do not begin these exercises until told by your health care provider. Stretching exercises These exercises warm up your muscles and joints and  improve the movement and flexibility of your hip. These exercises also help to relieve pain and stiffness. Exercise A: Iliotibial band stretch  7. Lie on your side with your left / right leg in the top position. 8. Bend your left / right knee and grab your ankle. 9. Slowly bring your knee back so your thigh is behind your body. 10. Slowly lower your knee toward the floor until you feel a gentle stretch on the outside of your left / right thigh. If you do not feel a stretch and your knee will not fall farther, place the heel of your other foot on top of your outer knee and pull your thigh down farther. 11. Hold this position for __________ seconds. 12. Slowly return to the starting position. Repeat __________ times. Complete this exercise __________ times a day. Strengthening exercises These exercises build strength and endurance in your hip and pelvis. Endurance is the ability to use your muscles for a long time, even after they get tired. Exercise B: Bridge (hip extensors)  5. Lie on your back on a firm surface with your knees bent and your feet flat on the floor. 6. Tighten your buttocks muscles and lift your buttocks off the floor until your trunk is level with your thighs. You should feel the muscles working in your buttocks and the back of your thighs. If this exercise is too easy, try doing it with your arms crossed over your chest. 7. Hold this position for __________ seconds. 8. Slowly return to the starting position. 9. Let your muscles relax completely between repetitions. Repeat __________ times. Complete this exercise __________ times a day. Exercise C: Squats (knee extensors and  quadriceps) 1. Stand in front of a table, with your feet and knees pointing straight ahead. You may rest your hands on the table for balance but not for support. 2. Slowly bend your knees and lower your hips like you are going to sit in a chair. ? Keep your weight over your heels, not over your  toes. ? Keep your lower legs upright so they are parallel with the table legs. ? Do not let your hips go lower than your knees. ? Do not bend lower than told by your health care provider. ? If your hip pain increases, do not bend as low. 3. Hold this position for __________ seconds. 4. Slowly push with your legs to return to standing. Do not use your hands to pull yourself to standing. Repeat __________ times. Complete this exercise __________ times a day. Exercise D: Hip hike 6. Stand sideways on a bottom step. Stand on your left / right leg with your other foot unsupported next to the step. You can hold onto the railing or wall if needed for balance. 7. Keeping your knees straight and your torso square, lift your left / right hip up toward the ceiling. 8. Hold this position for __________ seconds. 9. Slowly let your left / right hip lower toward the floor, past the starting position. Your foot should get closer to the floor.  Do not lean or bend your knees. Repeat __________ times. Complete this exercise __________ times a day. Exercise E: Single leg stand 6. Stand near a counter or door frame that you can hold onto for balance as needed. It is helpful to stand in front of a mirror for this exercise so you can watch your hip. 7. Squeeze your left / right buttock muscles then lift up your other foot. Do not let your left / right hip push out to the side. 8. Hold this position for __________ seconds. Repeat __________ times. Complete this exercise __________ times a day. This information is not intended to replace advice given to you by your health care provider. Make sure you discuss any questions you have with your health care provider. Document Released: 07/29/2004 Document Revised: 02/26/2016 Document Reviewed: 06/06/2015 Elsevier Interactive Patient Education  2019 [employer].

## 2018-12-12 ENCOUNTER — Other Ambulatory Visit: Payer: Self-pay

## 2018-12-12 ENCOUNTER — Other Ambulatory Visit: Payer: Self-pay | Admitting: Internal Medicine

## 2018-12-12 DIAGNOSIS — E042 Nontoxic multinodular goiter: Secondary | ICD-10-CM

## 2018-12-12 DIAGNOSIS — Z79899 Other long term (current) drug therapy: Secondary | ICD-10-CM

## 2018-12-13 LAB — CBC WITH DIFFERENTIAL/PLATELET
Absolute Monocytes: 725 cells/uL (ref 200–950)
Basophils Absolute: 98 cells/uL (ref 0–200)
Basophils Relative: 1 %
Eosinophils Absolute: 294 cells/uL (ref 15–500)
Eosinophils Relative: 3 %
HCT: 41.7 % (ref 35.0–45.0)
Hemoglobin: 14 g/dL (ref 11.7–15.5)
Lymphs Abs: 2450 cells/uL (ref 850–3900)
MCH: 31.2 pg (ref 27.0–33.0)
MCHC: 33.6 g/dL (ref 32.0–36.0)
MCV: 92.9 fL (ref 80.0–100.0)
MPV: 10.3 fL (ref 7.5–12.5)
Monocytes Relative: 7.4 %
Neutro Abs: 6233 cells/uL (ref 1500–7800)
Neutrophils Relative %: 63.6 %
Platelets: 320 10*3/uL (ref 140–400)
RBC: 4.49 10*6/uL (ref 3.80–5.10)
RDW: 12.7 % (ref 11.0–15.0)
Total Lymphocyte: 25 %
WBC: 9.8 10*3/uL (ref 3.8–10.8)

## 2018-12-13 LAB — COMPLETE METABOLIC PANEL WITH GFR
AG Ratio: 1.8 (calc) (ref 1.0–2.5)
ALT: 13 U/L (ref 6–29)
AST: 14 U/L (ref 10–35)
Albumin: 4.6 g/dL (ref 3.6–5.1)
Alkaline phosphatase (APISO): 98 U/L (ref 37–153)
BUN: 17 mg/dL (ref 7–25)
CO2: 26 mmol/L (ref 20–32)
Calcium: 9.4 mg/dL (ref 8.6–10.4)
Chloride: 99 mmol/L (ref 98–110)
Creat: 0.91 mg/dL (ref 0.60–0.93)
GFR, Est African American: 72 mL/min/{1.73_m2} (ref >=60)
GFR, Est Non African American: 62 mL/min/{1.73_m2} (ref >=60)
Globulin: 2.5 g/dL (calc) (ref 1.9–3.7)
Glucose, Bld: 93 mg/dL (ref 65–99)
Potassium: 4.4 mmol/L (ref 3.5–5.3)
Sodium: 139 mmol/L (ref 135–146)
Total Bilirubin: 1 mg/dL (ref 0.2–1.2)
Total Protein: 7.1 g/dL (ref 6.1–8.1)

## 2018-12-13 LAB — SPECIMEN COMPROMISED

## 2018-12-13 NOTE — Progress Notes (Signed)
CBC WNL.  CMP WNL (note from lab states specimen could have been compromised so her glucose could be falsely low) please advise patient to monitor blood glucose at home.

## 2019-01-17 ENCOUNTER — Other Ambulatory Visit: Payer: Medicare Other

## 2019-02-20 ENCOUNTER — Other Ambulatory Visit: Payer: Self-pay

## 2019-02-20 ENCOUNTER — Ambulatory Visit (INDEPENDENT_AMBULATORY_CARE_PROVIDER_SITE_OTHER): Payer: Medicare Other | Admitting: Rheumatology

## 2019-02-20 ENCOUNTER — Encounter: Payer: Self-pay | Admitting: Rheumatology

## 2019-02-20 VITALS — BP 120/89 | HR 94 | Resp 13 | Ht 61.0 in | Wt 165.2 lb

## 2019-02-20 DIAGNOSIS — M19072 Primary osteoarthritis, left ankle and foot: Secondary | ICD-10-CM

## 2019-02-20 DIAGNOSIS — M0609 Rheumatoid arthritis without rheumatoid factor, multiple sites: Secondary | ICD-10-CM

## 2019-02-20 DIAGNOSIS — Z8639 Personal history of other endocrine, nutritional and metabolic disease: Secondary | ICD-10-CM

## 2019-02-20 DIAGNOSIS — M7061 Trochanteric bursitis, right hip: Secondary | ICD-10-CM

## 2019-02-20 DIAGNOSIS — M8589 Other specified disorders of bone density and structure, multiple sites: Secondary | ICD-10-CM

## 2019-02-20 DIAGNOSIS — Z8659 Personal history of other mental and behavioral disorders: Secondary | ICD-10-CM

## 2019-02-20 DIAGNOSIS — Z79899 Other long term (current) drug therapy: Secondary | ICD-10-CM | POA: Diagnosis not present

## 2019-02-20 DIAGNOSIS — M19071 Primary osteoarthritis, right ankle and foot: Secondary | ICD-10-CM | POA: Diagnosis not present

## 2019-02-20 DIAGNOSIS — G8929 Other chronic pain: Secondary | ICD-10-CM

## 2019-02-20 DIAGNOSIS — M19041 Primary osteoarthritis, right hand: Secondary | ICD-10-CM | POA: Diagnosis not present

## 2019-02-20 DIAGNOSIS — K219 Gastro-esophageal reflux disease without esophagitis: Secondary | ICD-10-CM

## 2019-02-20 DIAGNOSIS — Z794 Long term (current) use of insulin: Secondary | ICD-10-CM

## 2019-02-20 DIAGNOSIS — M545 Low back pain, unspecified: Secondary | ICD-10-CM

## 2019-02-20 DIAGNOSIS — I10 Essential (primary) hypertension: Secondary | ICD-10-CM

## 2019-02-20 DIAGNOSIS — Z87898 Personal history of other specified conditions: Secondary | ICD-10-CM

## 2019-02-20 DIAGNOSIS — M19042 Primary osteoarthritis, left hand: Secondary | ICD-10-CM

## 2019-02-20 DIAGNOSIS — E118 Type 2 diabetes mellitus with unspecified complications: Secondary | ICD-10-CM

## 2019-02-20 DIAGNOSIS — Z8669 Personal history of other diseases of the nervous system and sense organs: Secondary | ICD-10-CM

## 2019-02-20 DIAGNOSIS — Z87891 Personal history of nicotine dependence: Secondary | ICD-10-CM

## 2019-02-20 MED ORDER — LEFLUNOMIDE 10 MG PO TABS: ORAL_TABLET | ORAL | 2 refills | Status: DC

## 2019-02-20 NOTE — Progress Notes (Signed)
Office Visit Note  Patient: Annette Suarez             Date of Birth: 05/01/1941           MRN: [id number]             PCP: Shirline Frees, MD Referring: Shirline Frees, MD Visit Date: 02/20/2019 Occupation: @GUAROCC @  Subjective:  Discuss medication options     History of Present Illness: Annette Suarez is a 78 y.o. female with history of seronegative rheumatoid arthritis and osteoarthritis.  She was previously on Enbrel 50 mg sq injections every 14 days but she started having increased joint pain and swelling and resumed injecting weekly in June 2020.  She has not noticed any improvement since increasing the frequency of Enbrel injections.  She reports that she has to inject insulin daily, so she  does not want to be on any other injectable medications.  She was previously on injectable methotrexate but discontinued due to elevated creatinine.  She reports that she is having increased pain in bilateral hands and bilateral knee joints.  She denies any joint swelling at this time.  She has difficulty getting up from a seated position due to discomfort in her knees.  Activities of Daily Living:  Patient reports morning stiffness for 1 hour.   Patient Reports nocturnal pain.  Difficulty dressing/grooming: Denies Difficulty climbing stairs: Denies Difficulty getting out of chair: Denies Difficulty using hands for taps, buttons, cutlery, and/or writing: Reports  Review of Systems  Constitutional: Positive for fatigue.  HENT: Positive for mouth dryness. Negative for mouth sores and nose dryness.   Eyes: Positive for visual disturbance. Negative for pain, itching and dryness.  Respiratory: Positive for wheezing. Negative for shortness of breath and difficulty breathing.   Cardiovascular: Negative for chest pain, palpitations and swelling in legs/feet.  Gastrointestinal: Negative for blood in stool, constipation and diarrhea.  Endocrine: Negative for increased urination.  Genitourinary:  Negative for painful urination.  Musculoskeletal: Positive for arthralgias, joint pain, joint swelling and morning stiffness.  Skin: Negative for rash and redness.  Allergic/Immunologic: Negative for susceptible to infections.  Neurological: Negative for dizziness, light-headedness, headaches, memory loss and weakness.  Hematological: Negative for bruising/bleeding tendency.  Psychiatric/Behavioral: Negative for confusion and sleep disturbance. The patient is not nervous/anxious.     PMFS History:  Patient Active Problem List   Diagnosis Date Noted  . Primary osteoarthritis of both feet 12/13/2017  . Dehydration 09/09/2017  . Acute lower UTI 09/09/2017  . Nausea & vomiting 09/08/2017  . Primary osteoarthritis of both hands 04/05/2017  . Left carpal tunnel syndrome 11/15/2016  . Rheumatoid arthritis of multiple sites with negative rheumatoid factor ([place]) 10/06/2016  . High risk medication use 10/06/2016  . Dyslipidemia 10/06/2016  . Pain in both hands 09/08/2016  . Essential hypertension 09/08/2016  . Type 2 diabetes mellitus  09/08/2016  . Gastroesophageal reflux disease without esophagitis 09/08/2016  . Osteopenia of multiple sites 09/08/2016  . History of anxiety 09/08/2016  . Benign paroxysmal positional vertigo 09/08/2016  . Smoker 09/08/2016  . Sleep apnea 09/08/2016    Past Medical History:  Diagnosis Date  . Diabetes mellitus without complication ([place])   . Goiter, nontoxic, multinodular   . Hypertension   . RA (rheumatoid arthritis) ([facility])     Family History  Problem Relation Age of Onset  . Hypertension Other   . Diabetes Mother   . Heart disease Father   . Diabetes Brother   . Cancer Sister  breast   . Diabetes Son   . Crohn's disease Son    Past Surgical History:  Procedure Laterality Date  . CHOLECYSTECTOMY    . SQUAMOUS CELL CARCINOMA EXCISION  05/2018   Social History   Social History Narrative  . Not on file    There is no immunization  history on file for this patient.   Objective: Vital Signs: BP 120/89 (BP Location: Left Arm, Patient Position: Sitting, Cuff Size: Normal)   Pulse 94   Resp 13   Ht 5\' 1"  (1.549 m)   Wt 165 lb 3.2 oz (74.9 kg)   BMI 31.21 kg/m    Physical Exam Vitals signs and nursing note reviewed.  Constitutional:      Appearance: She is well-developed.  HENT:     Head: Normocephalic and atraumatic.  Eyes:     Conjunctiva/sclera: Conjunctivae normal.  Neck:     Musculoskeletal: Normal range of motion.  Cardiovascular:     Rate and Rhythm: Normal rate and regular rhythm.     Heart sounds: Normal heart sounds.  Pulmonary:     Effort: Pulmonary effort is normal.     Breath sounds: Normal breath sounds.  Abdominal:     General: Bowel sounds are normal.     Palpations: Abdomen is soft.  Lymphadenopathy:     Cervical: No cervical adenopathy.  Skin:    General: Skin is warm and dry.     Capillary Refill: Capillary refill takes less than 2 seconds.  Neurological:     Mental Status: She is alert and oriented to person, place, and time.  Psychiatric:        Behavior: Behavior normal.      Musculoskeletal Exam: C-spine, thoracic spine, lumbar spine good range of motion.  No midline spinal tenderness.  No SI joint tenderness.  Shoulder joints, elbow joints, wrist joints, MCPs and PIPs and DIPs good range of motion no synovitis.  She has PIP and DIP synovial thickening consistent with osteoarthritis of bilateral hands.  She has bilateral CMC joint synovial thickening.  Hip joints have good range of motion.  She has tenderness over the right trochanteric bursa.  Knee joints, ankle joints, MTPs, PIPs and DIPs good range of motion no synovitis.  No warmth or effusion of bilateral knee joints.  No tenderness or swelling of ankle joints.  CDAI Exam: CDAI Score: 1  Patient Global: 7 mm; Provider Global: 3 mm Swollen: 0 ; Tender: 0  Joint Exam   No joint exam has been documented for this visit    There is currently no information documented on the homunculus. Go to the Rheumatology activity and complete the homunculus joint exam.  Investigation: No additional findings.  Imaging: No results found.  Recent Labs: Lab Results  Component Value Date   WBC 9.8 12/12/2018   HGB 14.0 12/12/2018   PLT 320 12/12/2018   NA 139 12/12/2018   K 4.4 12/12/2018   CL 99 12/12/2018   CO2 26 12/12/2018   GLUCOSE 93 12/12/2018   BUN 17 12/12/2018   CREATININE 0.91 12/12/2018   BILITOT 1.0 12/12/2018   ALKPHOS 56 09/09/2017   AST 14 12/12/2018   ALT 13 12/12/2018   PROT 7.1 12/12/2018   ALBUMIN 3.2 (L) 09/09/2017   CALCIUM 9.4 12/12/2018   GFRAA 72 12/12/2018   QFTBGOLDPLUS NEGATIVE 06/13/2018    Speciality Comments: Recieves Enbrel through [facility] 02/20/2019 Osteopenia managed by Dr. Annice Pih 02/20/2019  Procedures:  No procedures performed Allergies:  Latex     Assessment / Plan:     Visit Diagnoses: Rheumatoid arthritis of multiple sites with negative rheumatoid factor ([place]) - She has no synovitis on exam.  She is been having increased pain in bilateral hands and bilateral knee joints.  She has PIP and DIP synovial thickening consistent with osteoarthritis of bilateral hands.  She has bilateral CMC joint synovial thickening.  Knee joints have discomfort when rising from a seated position.  No warmth or effusion of knee joints were noted.  She has been on Enbrel 50 mg subcutaneous injections every 14 days for couple of years.  She recently has been having increased arthralgias and increase the frequency of Enbrel injections to once weekly in June 2020.  She did not notice any improvement increasing the frequency.  She does not want to be on any injectable medications at this time due to having to inject insulin on a daily basis.  She would like to switch to [place] and discontinue Enbrel. Indications, contraindications, potential side effects of Arava were  discussed.  She will start taking Arava 10 mg 1 tablet by mouth daily for 2 weeks and return for lab work at that time.  If labs are stable she will increase to Arava 20 mg by mouth daily.  She is in agreement with the plan and consent was obtained today.  She will follow-up in the office in 3 months.  Medication counseling:  Patient was counseled on the purpose, proper use, and adverse effects of leflunomide including risk of infection, nausea/diarrhea/weight loss, increase in blood pressure, rash, hair loss, tingling in the hands and feet, and signs and symptoms of interstitial lung disease.   Also counseled on Black Box warning of liver injury and importance of avoiding alcohol while on therapy. Discussed that there is the possibility of an increased risk of malignancy but it is not well understood if this increased risk is due to the medication or the disease state.  Counseled patient to avoid live vaccines. Recommend annual influenza, Pneumovax 23, Prevnar 13, and Shingrix as indicated.   Discussed the importance of frequent monitoring of liver function and blood count.  Standing orders placed.  Discussed importance of birth control while on leflunomide due to risk of congenital abnormalities, and patient confirms she is postmenopausal. Provided patient with educational materials on leflunomide and answered all questions.  Patient consented to [place] use, and consent will be uploaded into the media tab.   High risk medication use - Arava 10 mg po daily for 2 weeks and if labs are stable at that time he will increase to 20 mg po daily.  She will return for lab work in 2 weeks x 2, then 2 months, then every 3 months.  Standing orders are in place.  She will be discontinuing Enbrel due to not wanting to be on an injectable medication at this time.   Primary osteoarthritis of both hands - She has PIP and DIP synovial thickening consistent with osteoarthritis of both hands.  CMC joint synovial thickening  bilaterally. She has no synovitis or tenderness on exam.  Joint protection and muscle strengthening were discussed.   Primary osteoarthritis of both feet-She has osteoarthritic changes in both feet.  She has bunions bilaterally.  No tenderness on exam.  She wears proper fitting shoes.   Osteopenia of multiple sites -Managed by Dr. Kenton Kingfisher. She is on a daily calcium/vitamin D supplement.  Last DEXA in March 2018 was stable.  Trochanteric bursitis, right hip -  She has tenderness over the right trochanteric bursa.  She has good ROM of both hips on exam.  She has no groin pain.  She was given a handout of exercises to perform.   Chronic midline low back pain without sciatica -She has no lower back pain at this time.   Other medical conditions are listed as follows:   Essential hypertension  History of anxiety   Type 2 diabetes mellitus   History of sleep apnea  Gastroesophageal reflux disease without esophagitis  History of vertigo  History of hyperlipidemia   Former smoker   Orders: No orders of the defined types were placed in this encounter.  Meds ordered this encounter  Medications  . leflunomide (ARAVA) 10 MG tablet    Sig: Take 1 tablet (10mg ) daily for 2 weeks then increase to 2 tablets (20 mg) daily if tolerated.    Dispense:  60 tablet    Refill:  2    Face-to-face time spent with patient was 30 minutes. Greater than 50% of time was spent in counseling and coordination of care.  Follow-Up Instructions: Return in about 5 months (around 07/23/2019) for Rheumatoid arthritis, Osteoarthritis.  Hazel Sams PA-C  I examined and evaluated the patient with Hazel Sams PA.  Patient had no synovitis on examination today.  Although she wants to get off Enbrel.  Her arthritis has been well controlled.  Different treatment options and their side effects were discussed at length.  After reviewing indication side effects contraindications she wanted to proceed with [employer].  The plan of  care was discussed as noted above.  Bo Merino, MD   Note - This record has been created using Editor, commissioning.  Chart creation errors have been sought, but may not always  have been located. Such creation errors do not reflect on  the standard of medical care.

## 2019-02-20 NOTE — Progress Notes (Signed)
Pharmacy Note  Subjective: Patient presents today to the [facility] to see Dr. Estanislado Pandy.  Patient seen by the pharmacist for counseling on leflunomide Jolee Ewing) for rheumatoid arthritis.  Prior therapy includes Enbrel.  Objective: CBC    Component Value Date/Time   WBC 9.8 12/12/2018 1133   RBC 4.49 12/12/2018 1133   HGB 14.0 12/12/2018 1133   HCT 41.7 12/12/2018 1133   PLT 320 12/12/2018 1133   MCV 92.9 12/12/2018 1133   MCH 31.2 12/12/2018 1133   MCHC 33.6 12/12/2018 1133   RDW 12.7 12/12/2018 1133   LYMPHSABS 2,450 12/12/2018 1133   MONOABS 0.2 09/08/2017 1557   EOSABS 294 12/12/2018 1133   BASOSABS 98 12/12/2018 1133    CMP     Component Value Date/Time   NA 139 12/12/2018 1133   K 4.4 12/12/2018 1133   CL 99 12/12/2018 1133   CO2 26 12/12/2018 1133   GLUCOSE 93 12/12/2018 1133   BUN 17 12/12/2018 1133   CREATININE 0.91 12/12/2018 1133   CALCIUM 9.4 12/12/2018 1133   PROT 7.1 12/12/2018 1133   ALBUMIN 3.2 (L) 09/09/2017 0441   AST 14 12/12/2018 1133   ALT 13 12/12/2018 1133   ALKPHOS 56 09/09/2017 0441   BILITOT 1.0 12/12/2018 1133   GFRNONAA 62 12/12/2018 1133   GFRAA 72 12/12/2018 1133    Baseline Immunosuppressant Therapy Labs Quantiferon TB Gold Latest Ref Rng & Units 06/13/2018  Quantiferon TB Gold Plus NEGATIVE NEGATIVE    Hepatitis Latest Ref Rng & Units 09/08/2016  Hep B Surface Ag NEGATIVE NEGATIVE  Hep B IgM NON REACTIVE NON REACTIVE  Hep C Ab NEGATIVE NEGATIVE  Hep A IgM NON REACTIVE NON REACTIVE    No results found for: HIV  Immunoglobulin Electrophoresis Latest Ref Rng & Units 09/08/2016  IgG 694 - 1,618 mg/dL 837  IgM 48 - 271 mg/dL 37(L)    Serum Protein Electrophoresis Latest Ref Rng & Units 12/12/2018  Total Protein 6.1 - 8.1 g/dL 7.1  Albumin 3.8 - 4.8 g/dL -  Alpha-1 0.2 - 0.3 g/dL -  Alpha-2 0.5 - 0.9 g/dL -  Beta Globulin 0.4 - 0.6 g/dL -  Beta 2 0.2 - 0.5 g/dL -  Gamma Globulin 0.8 - 1.7 g/dL -  Interpretation -  -    No results found for: G6PDH  No results found for: TPMT   Pregnancy status:  hysterectomy  Assessment/Plan:  Patient was counseled on the purpose, proper use, and adverse effects of leflunomide including risk of infection, nausea/diarrhea/weight loss, increase in blood pressure, rash, hair loss, tingling in the hands and feet, and signs and symptoms of interstitial lung disease.   Also counseled on Black Box warning of liver injury and importance of avoiding alcohol while on therapy. Discussed that there is the possibility of an increased risk of malignancy but it is not well understood if this increased risk is due to the medication or the disease state.  Counseled patient to avoid live vaccines. Recommend annual influenza, Pneumovax 23, Prevnar 13, and Shingrix as indicated.   Discussed the importance of frequent monitoring of liver function and blood count.  Standing orders placed.  Discussed importance of birth control while on leflunomide due to risk of congenital abnormalities, and patient confirms hysterectomy.  Provided patient with educational materials on leflunomide and answered all questions.  Patient consented to [place] use, and consent will be uploaded into the media tab.    Patient dose will be Arava 10 mg daily for  2 weeks then increase to 2 tablets daily as tolerated.  [facility] per patient request.  She is to discontinue Enbrel.  All questions encourage and answered.  Instructed patient to call with any other questions or concerns.  Mariella Saa, PharmD, [place], [place] [facility] Pharmacist [phone]  02/20/2019 12:42 PM

## 2019-02-20 NOTE — Patient Instructions (Addendum)
Standing Labs We placed an order today for your standing lab work.    Please come back and get your standing labs in 2 weeks x2, 2 months, then every 3 months   We have open lab daily Monday through Thursday from 8:30-12:30 PM and 1:30-4:30 PM and Friday from 8:30-12:30 PM and 1:30 -4:00 PM at the office of Dr. Bo Merino.   You may experience shorter wait times on Monday and Friday afternoons. The office is located at [address] [place], [place], [place] [address] No appointment is necessary.   Labs are drawn by [facility].  You may receive a bill from [facility] for your lab work.  If you wish to have your labs drawn at another location, please call the office 24 hours in advance to send orders.  If you have any questions regarding directions or hours of operation,  please call [phone].   Just as a reminder please drink plenty of water prior to coming for your lab work. Thanks!   Leflunomide tablets What is this medicine? LEFLUNOMIDE (le FLOO na mide) is for rheumatoid arthritis. This medicine may be used for other purposes; ask your health care provider or pharmacist if you have questions. COMMON BRAND NAME(S): Arava What should I tell my health care provider before I take this medicine? They need to know if you have any of these conditions:  diabetes  have a fever or infection  high blood pressure  immune system problems  kidney disease  liver disease  low blood cell counts, like low white cell, platelet, or red cell counts  lung or breathing disease, like asthma  recently received or scheduled to receive a vaccine  receiving treatment for cancer  skin conditions or sensitivity  tingling of the fingers or toes, or other nerve disorder  tuberculosis  an unusual or allergic reaction to leflunomide, teriflunomide, other medicines, food, dyes, or preservatives  pregnant or trying to get pregnant  breast-feeding How should I use this medicine?  Take this medicine by mouth with a full glass of water. Follow the directions on the prescription label. Take your medicine at regular intervals. Do not take your medicine more often than directed. Do not stop taking except on your doctor's advice. Talk to your pediatrician regarding the use of this medicine in children. Special care may be needed. Overdosage: If you think you have taken too much of this medicine contact a poison control center or emergency room at once. NOTE: This medicine is only for you. Do not share this medicine with others. What if I miss a dose? If you miss a dose, take it as soon as you can. If it is almost time for your next dose, take only that dose. Do not take double or extra doses. What may interact with this medicine? Do not take this medicine with any of the following medications:  teriflunomide This medicine may also interact with the following medications:  alosetron  birth control pills  caffeine  cefaclor  certain medicines for diabetes like nateglinide, repaglinide, rosiglitazone, pioglitazone  certain medicines for high cholesterol like atorvastatin, pravastatin, rosuvastatin, simvastatin  charcoal  cholestyramine  ciprofloxacin  duloxetine  furosemide  ketoprofen  live virus vaccines  medicines that increase your risk for infection  methotrexate  mitoxantrone  paclitaxel  penicillin  theophylline  tizanidine  warfarin This list may not describe all possible interactions. Give your health care provider a list of all the medicines, herbs, non-prescription drugs, or dietary supplements you use. Also  tell them if you smoke, drink alcohol, or use illegal drugs. Some items may interact with your medicine. What should I watch for while using this medicine? Visit your health care provider for regular checks on your progress. Tell your doctor or health care provider if your symptoms do not start to get better or if they get worse.  You may need blood work done while you are taking this medicine. This medicine may cause serious skin reactions. They can happen weeks to months after starting the medicine. Contact your health care provider right away if you notice fevers or flu-like symptoms with a rash. The rash may be red or purple and then turn into blisters or peeling of the skin. Or, you might notice a red rash with swelling of the face, lips or lymph nodes in your neck or under your arms. This medicine may stay in your body for up to 2 years after your last dose. Tell your doctor about any unusual side effects or symptoms. A medicine can be given to help lower your blood levels of this medicine more quickly. Women must use effective birth control with this medicine. There is a potential for serious side effects to an unborn child. Do not become pregnant while taking this medicine. Inform your doctor if you wish to become pregnant. This medicine remains in your blood after you stop taking it. You must continue using effective birth control until the blood levels have been checked and they are low enough. A medicine can be given to help lower your blood levels of this medicine more quickly. Immediately talk to your doctor if you think you may be pregnant. You may need a pregnancy test. Talk to your health care provider or pharmacist for more information. You should not receive certain vaccines during your treatment and for a certain time after your treatment with this medication ends. Talk to your health care provider for more information. What side effects may I notice from receiving this medicine? Side effects that you should report to your doctor or health care professional as soon as possible:  allergic reactions like skin rash, itching or hives, swelling of the face, lips, or tongue  breathing problems  cough  increased blood pressure  low blood counts - this medicine may decrease the number of white blood cells and  platelets. You may be at increased risk for infections and bleeding.  pain, tingling, numbness in the hands or feet  rash, fever, and swollen lymph nodes  redness, blistering, peeing or loosening of the skin, including inside the mouth  signs of decreased platelets or bleeding - bruising, pinpoint red spots on the skin, black, tarry stools, blood in urine  signs of infection - fever or chills, cough, sore throat, pain or trouble passing urine  signs and symptoms of liver injury like dark yellow or brown urine; general ill feeling or flu-like symptoms; light-colored stools; loss of appetite; nausea; right upper belly pain; unusually weak or tired; yellowing of the eyes or skin  trouble passing urine or change in the amount of urine  vomiting Side effects that usually do not require medical attention (report to your doctor or health care professional if they continue or are bothersome):  diarrhea  hair thinning or loss  headache  nausea  tiredness This list may not describe all possible side effects. Call your doctor for medical advice about side effects. You may report side effects to FDA at 1-800-FDA-1088. Where should I keep my medicine? Keep out  of the reach of children. Store at room temperature between 15 and 30 degrees C (59 and 86 degrees F). Protect from moisture and light. Throw away any unused medicine after the expiration date. NOTE: This sheet is a summary. It may not cover all possible information. If you have questions about this medicine, talk to your doctor, pharmacist, or health care provider.  2020 Elsevier/Gold Standard (2018-09-22 15:06:48)      Hip Bursitis Rehab Ask your health care provider which exercises are safe for you. Do exercises exactly as told by your health care provider and adjust them as directed. It is normal to feel mild stretching, pulling, tightness, or discomfort as you do these exercises. Stop right away if you feel sudden pain or your  pain gets worse. Do not begin these exercises until told by your health care provider. Stretching exercise This exercise warms up your muscles and joints and improves the movement and flexibility of your hip. This exercise also helps to relieve pain and stiffness. Iliotibial band stretch An iliotibial band is a strong band of muscle tissue that runs from the outer side of your hip to the outer side of your thigh and knee. 1. Lie on your side with your left / right leg in the top position. 2. Bend your left / right knee and grab your ankle. Stretch out your bottom arm to help you balance. 3. Slowly bring your knee back so your thigh is behind your body. 4. Slowly lower your knee toward the floor until you feel a gentle stretch on the outside of your left / right thigh. If you do not feel a stretch and your knee will not fall farther, place the heel of your other foot on top of your knee and pull your knee down toward the floor with your foot. 5. Hold this position for __________ seconds. 6. Slowly return to the starting position. Repeat __________ times. Complete this exercise __________ times a day. Strengthening exercises These exercises build strength and endurance in your hip and pelvis. Endurance is the ability to use your muscles for a long time, even after they get tired. Bridge This exercise strengthens the muscles that move your thigh backward (hip extensors). 1. Lie on your back on a firm surface with your knees bent and your feet flat on the floor. 2. Tighten your buttocks muscles and lift your buttocks off the floor until your trunk is level with your thighs. ? Do not arch your back. ? You should feel the muscles working in your buttocks and the back of your thighs. If you do not feel these muscles, slide your feet 1-2 inches (2.5-5 cm) farther away from your buttocks. ? If this exercise is too easy, try doing it with your arms crossed over your chest. 3. Hold this position for  __________ seconds. 4. Slowly lower your hips to the starting position. 5. Let your muscles relax completely after each repetition. Repeat __________ times. Complete this exercise __________ times a day. Squats This exercise strengthens the muscles in front of your thigh and knee (quadriceps). 1. Stand in front of a table, with your feet and knees pointing straight ahead. You may rest your hands on the table for balance but not for support. 2. Slowly bend your knees and lower your hips like you are going to sit in a chair. ? Keep your weight over your heels, not over your toes. ? Keep your lower legs upright so they are parallel with the table legs. ?  Do not let your hips go lower than your knees. ? Do not bend lower than told by your health care provider. ? If your hip pain increases, do not bend as low. 3. Hold the squat position for __________ seconds. 4. Slowly push with your legs to return to standing. Do not use your hands to pull yourself to standing. Repeat __________ times. Complete this exercise __________ times a day. Hip hike 1. Stand sideways on a bottom step. Stand on your left / right leg with your other foot unsupported next to the step. You can hold on to the railing or wall for balance if needed. 2. Keep your knees straight and your torso square. Then lift your left / right hip up toward the ceiling. 3. Hold this position for __________ seconds. 4. Slowly let your left / right hip lower toward the floor, past the starting position. Your foot should get closer to the floor. Do not lean or bend your knees. Repeat __________ times. Complete this exercise __________ times a day. Single leg stand 1. Without shoes, stand near a railing or in a doorway. You may hold on to the railing or door frame as needed for balance. 2. Squeeze your left / right buttock muscles, then lift up your other foot. ? Do not let your left / right hip push out to the side. ? It is helpful to stand in  front of a mirror for this exercise so you can watch your hip. 3. Hold this position for __________ seconds. Repeat __________ times. Complete this exercise __________ times a day. This information is not intended to replace advice given to you by your health care provider. Make sure you discuss any questions you have with your health care provider. Document Released: 07/29/2004 Document Revised: 10/16/2018 Document Reviewed: 10/16/2018 Elsevier Patient Education  2020 Elsevier

## 2019-03-06 ENCOUNTER — Ambulatory Visit
Admission: RE | Admit: 2019-03-06 | Discharge: 2019-03-06 | Disposition: A | Discharge status: Home / Self Care | Payer: Medicare Other | Source: Ambulatory Visit | Attending: Internal Medicine | Admitting: Internal Medicine

## 2019-03-06 DIAGNOSIS — E042 Nontoxic multinodular goiter: Secondary | ICD-10-CM

## 2019-03-09 ENCOUNTER — Other Ambulatory Visit: Payer: Self-pay

## 2019-03-09 DIAGNOSIS — Z79899 Other long term (current) drug therapy: Secondary | ICD-10-CM

## 2019-03-10 LAB — COMPLETE METABOLIC PANEL WITH GFR
AG Ratio: 1.8 (calc) (ref 1.0–2.5)
ALT: 14 U/L (ref 6–29)
AST: 15 U/L (ref 10–35)
Albumin: 4.3 g/dL (ref 3.6–5.1)
Alkaline phosphatase (APISO): 93 U/L (ref 37–153)
BUN/Creatinine Ratio: 18 (calc) (ref 6–22)
BUN: 19 mg/dL (ref 7–25)
CO2: 26 mmol/L (ref 20–32)
Calcium: 9.4 mg/dL (ref 8.6–10.4)
Chloride: 104 mmol/L (ref 98–110)
Creat: 1.03 mg/dL — ABNORMAL HIGH (ref 0.60–0.93)
GFR, Est African American: 61 mL/min/{1.73_m2} (ref >=60)
GFR, Est Non African American: 53 mL/min/{1.73_m2} — ABNORMAL LOW (ref >=60)
Globulin: 2.4 g/dL (calc) (ref 1.9–3.7)
Glucose, Bld: 132 mg/dL — ABNORMAL HIGH (ref 65–99)
Potassium: 4.9 mmol/L (ref 3.5–5.3)
Sodium: 139 mmol/L (ref 135–146)
Total Bilirubin: 0.8 mg/dL (ref 0.2–1.2)
Total Protein: 6.7 g/dL (ref 6.1–8.1)

## 2019-03-10 LAB — CBC WITH DIFFERENTIAL/PLATELET
Absolute Monocytes: 640 cells/uL (ref 200–950)
Basophils Absolute: 97 cells/uL (ref 0–200)
Basophils Relative: 1 %
Eosinophils Absolute: 310 cells/uL (ref 15–500)
Eosinophils Relative: 3.2 %
HCT: 38.9 % (ref 35.0–45.0)
Hemoglobin: 13.2 g/dL (ref 11.7–15.5)
Lymphs Abs: 2212 cells/uL (ref 850–3900)
MCH: 31.4 pg (ref 27.0–33.0)
MCHC: 33.9 g/dL (ref 32.0–36.0)
MCV: 92.6 fL (ref 80.0–100.0)
MPV: 10.4 fL (ref 7.5–12.5)
Monocytes Relative: 6.6 %
Neutro Abs: 6441 cells/uL (ref 1500–7800)
Neutrophils Relative %: 66.4 %
Platelets: 312 10*3/uL (ref 140–400)
RBC: 4.2 10*6/uL (ref 3.80–5.10)
RDW: 12.3 % (ref 11.0–15.0)
Total Lymphocyte: 22.8 %
WBC: 9.7 10*3/uL (ref 3.8–10.8)

## 2019-03-13 NOTE — Progress Notes (Signed)
Glucose is elevated-132.  Creatinine is borderline elevated and GFR is low at 53. Please advise patient to avoid NSAIDs and to stay hydrated.   CBC WNL

## 2019-03-20 LAB — LIPID PANEL
Cholesterol: 149 (ref 0–200)
HDL: 36 (ref 35–70)
LDL Cholesterol: 66
Triglycerides: 236 — AB (ref 40–160)

## 2019-04-11 ENCOUNTER — Telehealth: Payer: Self-pay | Admitting: Rheumatology

## 2019-04-11 NOTE — Telephone Encounter (Signed)
Patient called stating for the past week she has been experiencing numbness and tingling in her right hand.  Patient states Dr. Estanislado Pandy prescribed Leflunomide in August and is checking if her symptoms could be a result of changing medications.

## 2019-04-11 NOTE — Telephone Encounter (Signed)
It is unlikely that the tingling is due to [place].  Most likely the tingling is due to arthritis in the wrist joint and compression of the median nerve.  As arthritis improves the tingling should improve.  If patient prefers we can schedule an appointment for her.

## 2019-04-11 NOTE — Telephone Encounter (Signed)
Patient states she is having tingling and numbness in her right hand and only slightly in her left hand. Patient states the right hand is worse. Patient states it has been going on for approximately one week. Patient states she does not recall any injury. Patient states the numbness and tingling is almost all the time. Patient states the only change she has had recently is starting [place] in August. Please advise.

## 2019-04-12 ENCOUNTER — Other Ambulatory Visit: Payer: Self-pay

## 2019-04-12 ENCOUNTER — Encounter: Payer: Self-pay | Admitting: Physician Assistant

## 2019-04-12 ENCOUNTER — Ambulatory Visit (INDEPENDENT_AMBULATORY_CARE_PROVIDER_SITE_OTHER): Payer: Medicare Other | Admitting: Physician Assistant

## 2019-04-12 VITALS — BP 114/80 | HR 95 | Resp 16 | Ht 61.0 in | Wt 161.4 lb

## 2019-04-12 DIAGNOSIS — Z79899 Other long term (current) drug therapy: Secondary | ICD-10-CM | POA: Diagnosis not present

## 2019-04-12 DIAGNOSIS — R202 Paresthesia of skin: Secondary | ICD-10-CM | POA: Diagnosis not present

## 2019-04-12 DIAGNOSIS — Z8639 Personal history of other endocrine, nutritional and metabolic disease: Secondary | ICD-10-CM

## 2019-04-12 DIAGNOSIS — Z794 Long term (current) use of insulin: Secondary | ICD-10-CM

## 2019-04-12 DIAGNOSIS — K219 Gastro-esophageal reflux disease without esophagitis: Secondary | ICD-10-CM

## 2019-04-12 DIAGNOSIS — M0609 Rheumatoid arthritis without rheumatoid factor, multiple sites: Secondary | ICD-10-CM | POA: Diagnosis not present

## 2019-04-12 DIAGNOSIS — Z8669 Personal history of other diseases of the nervous system and sense organs: Secondary | ICD-10-CM

## 2019-04-12 DIAGNOSIS — I10 Essential (primary) hypertension: Secondary | ICD-10-CM

## 2019-04-12 DIAGNOSIS — M19041 Primary osteoarthritis, right hand: Secondary | ICD-10-CM

## 2019-04-12 DIAGNOSIS — M19072 Primary osteoarthritis, left ankle and foot: Secondary | ICD-10-CM

## 2019-04-12 DIAGNOSIS — M7061 Trochanteric bursitis, right hip: Secondary | ICD-10-CM

## 2019-04-12 DIAGNOSIS — Z87891 Personal history of nicotine dependence: Secondary | ICD-10-CM

## 2019-04-12 DIAGNOSIS — Z87898 Personal history of other specified conditions: Secondary | ICD-10-CM

## 2019-04-12 DIAGNOSIS — M8589 Other specified disorders of bone density and structure, multiple sites: Secondary | ICD-10-CM

## 2019-04-12 DIAGNOSIS — M19042 Primary osteoarthritis, left hand: Secondary | ICD-10-CM

## 2019-04-12 DIAGNOSIS — Z8659 Personal history of other mental and behavioral disorders: Secondary | ICD-10-CM

## 2019-04-12 DIAGNOSIS — M19071 Primary osteoarthritis, right ankle and foot: Secondary | ICD-10-CM

## 2019-04-12 DIAGNOSIS — E118 Type 2 diabetes mellitus with unspecified complications: Secondary | ICD-10-CM

## 2019-04-12 MED ORDER — LEFLUNOMIDE 20 MG PO TABS: 20.0000 mg | ORAL_TABLET | Freq: Every day | ORAL | 0 refills | Status: DC

## 2019-04-12 NOTE — Progress Notes (Signed)
Office Visit Note  Patient: Annette Suarez             Date of Birth: Jun 20, 1941           MRN: [id number]             PCP: Shirline Frees, MD Referring: Shirline Frees, MD Visit Date: 04/12/2019 Occupation: @GUAROCC @  Subjective:  Bilateral hand numbness    History of Present Illness: Annette Suarez is a 78 y.o. female with history of seronegative rheumatoid arthritis and osteoarthritis.  Patient is taking Arava 10 mg 2 tablets by mouth daily which she started in August 2020.  She tolerating Arava without any side effects.  She has not noticed any improvement yet.  She states starting about 2 weeks ago she developed numbness and tingling in both hands.  She states that symptoms have been constant and she is been experiencing increased symptoms at night.  She states her symptoms are worse on her right hand.  She continues to have joint pain in the right hand.  She states that she was shelling peas the other day and had increased discomfort in the right first MCP joint.  She is been taking Tylenol as needed for pain relief.  She denies any joint swelling at this time.  Activities of Daily Living:  Patient reports morning stiffness for 2 hours.   Patient Reports nocturnal pain.  Difficulty dressing/grooming: Denies Difficulty climbing stairs: Reports Difficulty getting out of chair: Denies Difficulty using hands for taps, buttons, cutlery, and/or writing: Reports  Review of Systems  Constitutional: Negative for fatigue.  HENT: Negative for mouth sores, mouth dryness and nose dryness.   Eyes: Negative for pain, visual disturbance and dryness.  Respiratory: Negative for cough, hemoptysis, shortness of breath and difficulty breathing.   Cardiovascular: Positive for swelling in legs/feet. Negative for chest pain, palpitations and hypertension.  Gastrointestinal: Negative for blood in stool, constipation and diarrhea.  Endocrine: Negative for increased urination.  Genitourinary: Negative  for painful urination and involuntary urination.  Musculoskeletal: Positive for arthralgias, joint pain, joint swelling and morning stiffness. Negative for myalgias, muscle weakness, muscle tenderness and myalgias.  Skin: Negative for color change, pallor, rash, hair loss, nodules/bumps, skin tightness, ulcers and sensitivity to sunlight.  Neurological: Positive for numbness. Negative for dizziness and headaches.  Hematological: Negative for bruising/bleeding tendency and swollen glands.  Psychiatric/Behavioral: Positive for sleep disturbance. Negative for depressed mood. The patient is not nervous/anxious.     PMFS History:  Patient Active Problem List   Diagnosis Date Noted  . Primary osteoarthritis of both feet 12/13/2017  . Dehydration 09/09/2017  . Acute lower UTI 09/09/2017  . Nausea & vomiting 09/08/2017  . Primary osteoarthritis of both hands 04/05/2017  . Left carpal tunnel syndrome 11/15/2016  . Rheumatoid arthritis of multiple sites with negative rheumatoid factor ([place]) 10/06/2016  . High risk medication use 10/06/2016  . Dyslipidemia 10/06/2016  . Pain in both hands 09/08/2016  . Essential hypertension 09/08/2016  . Type 2 diabetes mellitus  09/08/2016  . Gastroesophageal reflux disease without esophagitis 09/08/2016  . Osteopenia of multiple sites 09/08/2016  . History of anxiety 09/08/2016  . Benign paroxysmal positional vertigo 09/08/2016  . Smoker 09/08/2016  . Sleep apnea 09/08/2016    Past Medical History:  Diagnosis Date  . Diabetes mellitus without complication ([place])   . Goiter, nontoxic, multinodular   . Hypertension   . RA (rheumatoid arthritis) ([facility])     Family History  Problem Relation Age of  Onset  . Hypertension Other   . Diabetes Mother   . Heart disease Father   . Diabetes Brother   . Cancer Sister        breast   . Diabetes Son   . Crohn's disease Son    Past Surgical History:  Procedure Laterality Date  . CHOLECYSTECTOMY    . SQUAMOUS  CELL CARCINOMA EXCISION  05/2018   Social History   Social History Narrative  . Not on file    There is no immunization history on file for this patient.   Objective: Vital Signs: BP 114/80 (BP Location: Left Arm, Patient Position: Sitting, Cuff Size: Normal)   Pulse 95   Resp 16   Ht 5\' 1"  (1.549 m)   Wt 161 lb 6.4 oz (73.2 kg)   BMI 30.50 kg/m    Physical Exam Vitals signs and nursing note reviewed.  Constitutional:      Appearance: She is well-developed.  HENT:     Head: Normocephalic and atraumatic.  Eyes:     Conjunctiva/sclera: Conjunctivae normal.  Neck:     Musculoskeletal: Normal range of motion.  Cardiovascular:     Rate and Rhythm: Normal rate and regular rhythm.     Heart sounds: Normal heart sounds.  Pulmonary:     Effort: Pulmonary effort is normal.     Breath sounds: Normal breath sounds.  Abdominal:     General: Bowel sounds are normal.     Palpations: Abdomen is soft.  Lymphadenopathy:     Cervical: No cervical adenopathy.  Skin:    General: Skin is warm and dry.     Capillary Refill: Capillary refill takes less than 2 seconds.  Neurological:     Mental Status: She is alert and oriented to person, place, and time.  Psychiatric:        Behavior: Behavior normal.      Musculoskeletal Exam: C-spine, thoracic spine, spine good range of motion.  No midline spinal tenderness.  No SI joint tenderness.  Shoulder joints, elbow joints, MCPs, PIPs and DIPs good range of motion with no synovitis.  She has tenderness and mild inflammation of bilateral wrist joints.  PIP and DIP synovial thickening consistent with osteoarthritis of both hands.  Bilateral CMC joint synovial thickening.  She synovial thickening and tenderness of bilateral first MTP joints.  Hip joints, knee joints medical joints and MTPs and PIPs, DIPs good range of motion with no synovitis.  No warmth or effusion of bilateral knee joints.  No tenderness or swelling of ankle joints.  No tenderness  of MTPs or PIP joints.  CDAI Exam: CDAI Score: - Patient Global: -; Provider Global: - Swollen: -; Tender: - Joint Exam   No joint exam has been documented for this visit   There is currently no information documented on the homunculus. Go to the Rheumatology activity and complete the homunculus joint exam.  Investigation: No additional findings.  Imaging: No results found.  Recent Labs: Lab Results  Component Value Date   WBC 9.7 03/09/2019   HGB 13.2 03/09/2019   PLT 312 03/09/2019   NA 139 03/09/2019   K 4.9 03/09/2019   CL 104 03/09/2019   CO2 26 03/09/2019   GLUCOSE 132 (H) 03/09/2019   BUN 19 03/09/2019   CREATININE 1.03 (H) 03/09/2019   BILITOT 0.8 03/09/2019   ALKPHOS 56 09/09/2017   AST 15 03/09/2019   ALT 14 03/09/2019   PROT 6.7 03/09/2019   ALBUMIN 3.2 (L) 09/09/2017  CALCIUM 9.4 03/09/2019   GFRAA 61 03/09/2019   QFTBGOLDPLUS NEGATIVE 06/13/2018    Speciality Comments: Recieves Enbrel through [facility] 02/20/2019 Osteopenia managed by Dr. Annice Pih 02/20/2019  Procedures:  No procedures performed Allergies: Latex    Assessment / Plan:     Visit Diagnoses: Rheumatoid arthritis of multiple sites with negative rheumatoid factor ([place]): She has tenderness and mild inflammation of bilateral wrist joints.  She has been experiencing paresthesias in both hands for the past 2 weeks.  We explained that the paresthesias are likely related to having inflammation in both wrist and may be compressing the median nerves.  She recently switched from Enbrel to [place] due to not wanting to be on injectable medication.  She is taking Arava 10 mg 2 tablets by mouth daily which was started in August 2020.  We will obtain lab work today to monitor for drug toxicity.  She will continue on this current treatment regimen.  A refill of [place] was sent to the pharmacy today.  She declined a prednisone taper or cortisone injection at this time.  She was advised  to notify us if she develops increased joint pain or joint swelling.  She will follow-up in the office in 3 months.  High risk medication use -Arava 10 mg 2 tablets daily started in August 2020.  Most recent CBC/CMP within normal limits except for mildly elevated serum creatinine on 03/09/2019.  Due for CBC/CMP today to monitor for drug toxicity and will monitor every 3 months.  Previously on Enbrel.  - Plan: CBC with Differential/Platelet, COMPLETE METABOLIC PANEL WITH GFR  Primary osteoarthritis of both hands: She has PIP and DIP synovial thickening system with osteoarthritis of both hands.  She has complete fist formation bilaterally.  She experiences CMC joint tenderness and discomfort at times.  Joint protection and muscle strengthening were discussed.  Paresthesia of both hands: She has been experiencing bilateral hand paresthesias for the past 2 weeks.  She has been experiencing nocturnal symptoms. She has ongoing inflammation in bilateral wrist joints which is contributing to her symptoms.  We discussed using bilateral carpal tunnel night splints for symptomatic relief.  She declined cortisone injection at this time.  She declined a prednisone taper.  She declined nerve conduction study.  Primary osteoarthritis of both feet: She has osteoarthritic changes in bilateral feet.  She has no tenderness at this time.  She wears proper fitting shoes.  Osteopenia of multiple sites - Managed by Dr. Kenton Kingfisher. She is on a daily calcium/vitamin D supplement.  Last DEXA in March 2018 was stable.  Due for repeat DEXA.  Trochanteric bursitis, right hip: She has intermittent right trochanteric bursitis.   Other medical conditions are listed as follows:   Gastroesophageal reflux disease without esophagitis  History of anxiety  Essential hypertension  History of sleep apnea  History of vertigo  Type 2 diabetes mellitus   History of hyperlipidemia  Former smoker  Orders: Orders Placed This Encounter   Procedures  . CBC with Differential/Platelet  . COMPLETE METABOLIC PANEL WITH GFR   No orders of the defined types were placed in this encounter.   Face-to-face time spent with patient was 30 minutes. Greater than 50% of time was spent in counseling and coordination of care.  Follow-Up Instructions: Return in about 3 months (around 07/13/2019) for Rheumatoid arthritis, Osteoarthritis.   Ofilia Neas, PA-C  Note - This record has been created using Dragon software.  Chart creation errors have been sought, but may  not always  have been located. Such creation errors do not reflect on  the standard of medical care.

## 2019-04-12 NOTE — Telephone Encounter (Signed)
Patient advised it is unlikely that the tingling is due to [place].  Most likely the tingling is due to arthritis in the wrist joint and compression of the median nerve.  As arthritis improves the tingling should improve.  Patient has been scheduled for an appointment 04/12/19 at 1:20 pm.

## 2019-04-12 NOTE — Patient Instructions (Signed)
Magnesium malate 250 mg by mouth at bedtime for muscle spasms/muscle cramps You can increase to 500 mg at bedtime if you tolerate the lower dose

## 2019-04-13 LAB — CBC WITH DIFFERENTIAL/PLATELET
Absolute Monocytes: 626 cells/uL (ref 200–950)
Basophils Absolute: 96 cells/uL (ref 0–200)
Basophils Relative: 1.1 %
Eosinophils Absolute: 383 cells/uL (ref 15–500)
Eosinophils Relative: 4.4 %
HCT: 38.4 % (ref 35.0–45.0)
Hemoglobin: 12.8 g/dL (ref 11.7–15.5)
Lymphs Abs: 1757 cells/uL (ref 850–3900)
MCH: 30.5 pg (ref 27.0–33.0)
MCHC: 33.3 g/dL (ref 32.0–36.0)
MCV: 91.4 fL (ref 80.0–100.0)
MPV: 10.9 fL (ref 7.5–12.5)
Monocytes Relative: 7.2 %
Neutro Abs: 5838 cells/uL (ref 1500–7800)
Neutrophils Relative %: 67.1 %
Platelets: 331 10*3/uL (ref 140–400)
RBC: 4.2 10*6/uL (ref 3.80–5.10)
RDW: 12 % (ref 11.0–15.0)
Total Lymphocyte: 20.2 %
WBC: 8.7 10*3/uL (ref 3.8–10.8)

## 2019-04-13 LAB — COMPLETE METABOLIC PANEL WITH GFR
AG Ratio: 1.7 (calc) (ref 1.0–2.5)
ALT: 14 U/L (ref 6–29)
AST: 18 U/L (ref 10–35)
Albumin: 4.3 g/dL (ref 3.6–5.1)
Alkaline phosphatase (APISO): 103 U/L (ref 37–153)
BUN: 15 mg/dL (ref 7–25)
CO2: 27 mmol/L (ref 20–32)
Calcium: 9.3 mg/dL (ref 8.6–10.4)
Chloride: 103 mmol/L (ref 98–110)
Creat: 0.91 mg/dL (ref 0.60–0.93)
GFR, Est African American: 71 mL/min/{1.73_m2} (ref >=60)
GFR, Est Non African American: 61 mL/min/{1.73_m2} (ref >=60)
Globulin: 2.6 g/dL (calc) (ref 1.9–3.7)
Glucose, Bld: 186 mg/dL — ABNORMAL HIGH (ref 65–99)
Potassium: 4.7 mmol/L (ref 3.5–5.3)
Sodium: 138 mmol/L (ref 135–146)
Total Bilirubin: 0.6 mg/dL (ref 0.2–1.2)
Total Protein: 6.9 g/dL (ref 6.1–8.1)

## 2019-04-13 NOTE — Progress Notes (Signed)
Glucose is elevated-186.  Rest of CMP WNL.  CBC WNL

## 2019-05-08 ENCOUNTER — Ambulatory Visit: Payer: Medicare Other | Admitting: Physician Assistant

## 2019-06-05 ENCOUNTER — Telehealth: Payer: Self-pay

## 2019-06-05 ENCOUNTER — Telehealth: Payer: Self-pay | Admitting: Rheumatology

## 2019-06-05 NOTE — Telephone Encounter (Signed)
Patient states she has had tingling and numbness in her right hand and states she has been taking arava on schedule and has not missed any doses. Patient states her right hand itches at night. Patient states she has one joint that is swollen "between her thumb and first finger." Patient complains of bilateral foot pain, patient states she has noticed swelling in her ankles. Patient denies any discolorations of skin in her hands/feet. Patient states she mentioned the numbness/tingling at her last visit on 04/12/2019 but was advised she had not been on arava long enough to see the full benefit. Please advise.

## 2019-06-05 NOTE — Telephone Encounter (Signed)
I spoke with patient.  She states the rate was not effective and she continues to have swelling in her wrist and hands.  She continues to have tingling in her hands.  She was not having these symptoms while she was on Enbrel.  She states she has been off Enbrel since August.  She would like to restart on Enbrel.  Please schedule a virtual visit to discuss the medication and to apply for Enbrel.

## 2019-06-05 NOTE — Telephone Encounter (Signed)
Patient will be restarting on enbrel, she has a virtual visit on 06/07/2019 to discuss. Patient states she previously received enbrel through [place].   Can we re-apply through [employer]?

## 2019-06-05 NOTE — Telephone Encounter (Signed)
Patient is scheduled for a virtual visit on 06/07/2019. Message has been sent to [place] to re-apply for enbrel through [place].

## 2019-06-05 NOTE — Telephone Encounter (Signed)
Patient is having a lot of tingling/ numbness in right hand. This bothers her at night not during the day.  Patient states this has been going on since September, but not bad until now. Patient does have some tingling in left hand, but not as bad. Patient also having pain with feet when she stands on them for awhile. Please call patient to discuss.

## 2019-06-06 NOTE — Progress Notes (Signed)
Virtual Visit via Telephone Note  I connected with Annette Suarez on 06/07/19 at 10:30 AM EST by telephone and verified that I am speaking with the correct person using two identifiers.  Location: Patient: home  Provider: Clinic This service was conducted via virtual visit. The patient was located at home. I was located in my office.  Consent was obtained prior to the virtual visit and is aware of possible charges through their insurance for this visit.  The patient is an established patient.  Dr. Estanislado Pandy, MD conducted the virtual visit and Hazel Sams, PA-C acted as scribe during the service.  Office staff helped with scheduling follow up visits after the service was conducted.   I discussed the limitations, risks, security and privacy concerns of performing an evaluation and management service by telephone and the availability of in person appointments. I also discussed with the patient that there may be a patient responsible charge related to this service. The patient expressed understanding and agreed to proceed.  CC: Discuss medications  History of Present Illness: Patient is a 78 year old female with a past medical history of seronegative rheumatoid arthritis and osteoarthritis.  She is taking Arava 10 mg 2 tablet daily and has not missed any doses. She discontinued Enbrel in August and would like to restart on Enbrel.  She continues to have persistent pain and numbness in the right hand.  She is also having pain and inflammation in both ankle joints.    Review of Systems  Constitutional: Negative for fever and malaise/fatigue.  Eyes: Negative for photophobia, pain, discharge and redness.  Respiratory: Negative for cough, shortness of breath and wheezing.   Cardiovascular: Negative for chest pain and palpitations.  Gastrointestinal: Negative for blood in stool, constipation and diarrhea.  Genitourinary: Negative for dysuria.  Musculoskeletal: Positive for joint pain. Negative for back  pain, myalgias and neck pain.       +Joint swelling  +Morning stiffness   Skin: Negative for rash.  Neurological: Positive for tingling (R hand). Negative for dizziness and headaches.  Psychiatric/Behavioral: Positive for depression. The patient is nervous/anxious. The patient does not have insomnia.       Observations/Objective: Physical Exam  Constitutional: She is oriented to person, place, and time.  Neurological: She is alert and oriented to person, place, and time.  Psychiatric: Mood, memory, affect and judgment normal.   Patient reports morning stiffness for  1-2 hours.   Patient reports nocturnal pain.  Difficulty dressing/grooming: Denies Difficulty climbing stairs: Denies Difficulty getting out of chair: Denies Difficulty using hands for taps, buttons, cutlery, and/or writing: Denies  She rates her RA a 6/10 currently.   Assessment and Plan: Visit Diagnoses: Rheumatoid arthritis of multiple sites with negative rheumatoid factor [facility]): She is having pain and inflammation in the right hand/wrist and both ankle joints.  She has been experiencing paresthesias in both hands likely due to underlying inflammation.   She has been taking Arava 20 mg 1 tablet po daily since August 2020, but she is having an inadequate response and would like to restart on Enbrel 50 mg sq weekly injections.  She had previously discontinued Enbrel in August 2020 due to the concern for contracting covid-19.  She will return to the office to administer the enbrel injection since it has been greater than 3 months since her last injection, so she will need to be montiored.  She will continue taking Arava 20 mg po daily until Enbrel starts working.  She will follow up  in the office in 3 months to assess her response.   High risk medication use -Arava 20 mg 1 tablet daily started in August 2020.  She is having an inadequate response to [place].  She discontinued Enbrel in August 2020 and would like to restart on  Enbrel.  She will come to the office to administer the first injection so we can monitor her. CBC and CMP were drawn on 04/12/19.  TB gold is due next week.  Future order placed today.   Primary osteoarthritis of both hands: She is having pain and inflammation in the right hand currently.   Paresthesia of both hands: She has ongoing paresthesias in both hands, worse in rhe right hand likely related to inflammation in both wrist joints  She will be restarting on Enbrel.   Primary osteoarthritis of both feet: She is having increased pain in both feet. She has had intermittent swelling in both ankle joints.   Osteopenia of multiple sites - Managed by Dr. Kenton Kingfisher. She is on a daily calcium/vitamin D supplement.  Last DEXA in March 2018 was stable.  Due for repeat DEXA.  Trochanteric bursitis, right hip: She has intermittent symptoms of right trochanteric bursitis.   Other medical conditions are listed as follows:   Gastroesophageal reflux disease without esophagitis  History of anxiety  Essential hypertension  History of sleep apnea  History of vertigo  Type 2 diabetes mellitus   History of hyperlipidemia  Former smoker  Follow Up Instructions: She will follow up in 3 months.    I discussed the assessment and treatment plan with the patient. The patient was provided an opportunity to ask questions and all were answered. The patient agreed with the plan and demonstrated an understanding of the instructions.   The patient was advised to call back or seek an in-person evaluation if the symptoms worsen or if the condition fails to improve as anticipated.  I provided 25 minutes of non-face-to-face time during this encounter.   Bo Merino, MD   Scribed by-  Hazel Sams, PA-C

## 2019-06-07 ENCOUNTER — Encounter: Payer: Self-pay | Admitting: Rheumatology

## 2019-06-07 ENCOUNTER — Telehealth: Payer: Self-pay | Admitting: Pharmacy Technician

## 2019-06-07 ENCOUNTER — Other Ambulatory Visit: Payer: Self-pay

## 2019-06-07 ENCOUNTER — Telehealth (INDEPENDENT_AMBULATORY_CARE_PROVIDER_SITE_OTHER): Payer: Medicare Other | Admitting: Rheumatology

## 2019-06-07 DIAGNOSIS — Z79899 Other long term (current) drug therapy: Secondary | ICD-10-CM | POA: Diagnosis not present

## 2019-06-07 DIAGNOSIS — M19071 Primary osteoarthritis, right ankle and foot: Secondary | ICD-10-CM

## 2019-06-07 DIAGNOSIS — R202 Paresthesia of skin: Secondary | ICD-10-CM | POA: Diagnosis not present

## 2019-06-07 DIAGNOSIS — E118 Type 2 diabetes mellitus with unspecified complications: Secondary | ICD-10-CM

## 2019-06-07 DIAGNOSIS — K219 Gastro-esophageal reflux disease without esophagitis: Secondary | ICD-10-CM

## 2019-06-07 DIAGNOSIS — M0609 Rheumatoid arthritis without rheumatoid factor, multiple sites: Secondary | ICD-10-CM | POA: Diagnosis not present

## 2019-06-07 DIAGNOSIS — M19041 Primary osteoarthritis, right hand: Secondary | ICD-10-CM

## 2019-06-07 DIAGNOSIS — Z8639 Personal history of other endocrine, nutritional and metabolic disease: Secondary | ICD-10-CM

## 2019-06-07 DIAGNOSIS — Z8659 Personal history of other mental and behavioral disorders: Secondary | ICD-10-CM

## 2019-06-07 DIAGNOSIS — Z87891 Personal history of nicotine dependence: Secondary | ICD-10-CM

## 2019-06-07 DIAGNOSIS — M7061 Trochanteric bursitis, right hip: Secondary | ICD-10-CM

## 2019-06-07 DIAGNOSIS — Z794 Long term (current) use of insulin: Secondary | ICD-10-CM

## 2019-06-07 DIAGNOSIS — I10 Essential (primary) hypertension: Secondary | ICD-10-CM

## 2019-06-07 DIAGNOSIS — Z87898 Personal history of other specified conditions: Secondary | ICD-10-CM

## 2019-06-07 DIAGNOSIS — M8589 Other specified disorders of bone density and structure, multiple sites: Secondary | ICD-10-CM

## 2019-06-07 DIAGNOSIS — M19072 Primary osteoarthritis, left ankle and foot: Secondary | ICD-10-CM

## 2019-06-07 DIAGNOSIS — Z8669 Personal history of other diseases of the nervous system and sense organs: Secondary | ICD-10-CM

## 2019-06-07 DIAGNOSIS — M19042 Primary osteoarthritis, left hand: Secondary | ICD-10-CM

## 2019-06-07 NOTE — Telephone Encounter (Signed)
Patient application has been mailed to patient, will fax provider portion for 2021 renewal.   Advised patient to complete patient application and mail to [employer], patient verbalized understanding.

## 2019-06-07 NOTE — Telephone Encounter (Signed)
Submitted a Prior Authorization request to [facility] for ENBREL  via Cover My Meds. Will update once we receive a response.  Once determination is received, will fax approval to [place] for PAP renewal application.

## 2019-06-07 NOTE — Telephone Encounter (Signed)
Received notification from [facility] regarding a prior authorization for ENBREL. Authorization has been APPROVED from 06/07/19 to 07/04/20.   Will send document to scan center. Will send to [place] for pap renewal  Authorization # [id number] Phone # [phone]  3:56 PM Jakerra Floyd Delice Lesch, CPhT

## 2019-06-08 NOTE — Telephone Encounter (Signed)
Provider portion has been faxed.  Provided document to [employer] on 06/08/2019.

## 2019-06-11 NOTE — Telephone Encounter (Signed)
Thanks for the update

## 2019-06-19 NOTE — Telephone Encounter (Signed)
Patient states she received application, completed it and mailed to [employer].   Patient states she would like to re-start enbrel with enbrel sureclick versus the enbrel mini.

## 2019-06-22 NOTE — Telephone Encounter (Signed)
She is still active for 2020. Application for [id number] is pending.

## 2019-06-22 NOTE — Telephone Encounter (Signed)
Called [employer], and [employer] provided verbal to change prescription on file to [employer].  11:08 AM Beatriz Chancellor, CPhT

## 2019-06-22 NOTE — Telephone Encounter (Signed)
Patient is scheduled for a nurse visit to re-start enbrel on 06/25/2019. Advised patient to call and schedule a shipment from [place] because prescription is still active for 2020.

## 2019-06-22 NOTE — Telephone Encounter (Signed)
Attempted to contact patient and left message on machine for patient to call the office to schedule enbrel re-start nurse visit.

## 2019-06-25 ENCOUNTER — Other Ambulatory Visit: Payer: Self-pay

## 2019-06-25 ENCOUNTER — Ambulatory Visit (INDEPENDENT_AMBULATORY_CARE_PROVIDER_SITE_OTHER): Payer: Medicare Other | Admitting: Pharmacist

## 2019-06-25 VITALS — BP 142/85 | HR 103

## 2019-06-25 DIAGNOSIS — M0609 Rheumatoid arthritis without rheumatoid factor, multiple sites: Secondary | ICD-10-CM

## 2019-06-25 MED ORDER — ENBREL SURECLICK 50 MG/ML ~~LOC~~ SOAJ: 50.0000 mg | SUBCUTANEOUS | 0 refills | Status: DC

## 2019-06-25 NOTE — Progress Notes (Signed)
Pharmacy Note  Subjective:   Patient is being resuming Enbrel after a gap in therapy greater than 3 months.  Patient was previously counseled extensively on and consented to initiation of Enbrel at that time.   Objective: CMP     Component Value Date/Time   NA 138 04/12/2019 1405   K 4.7 04/12/2019 1405   CL 103 04/12/2019 1405   CO2 27 04/12/2019 1405   GLUCOSE 186 (H) 04/12/2019 1405   BUN 15 04/12/2019 1405   CREATININE 0.91 04/12/2019 1405   CALCIUM 9.3 04/12/2019 1405   PROT 6.9 04/12/2019 1405   ALBUMIN 3.2 (L) 09/09/2017 0441   AST 18 04/12/2019 1405   ALT 14 04/12/2019 1405   ALKPHOS 56 09/09/2017 0441   BILITOT 0.6 04/12/2019 1405   GFRNONAA 61 04/12/2019 1405   GFRAA 71 04/12/2019 1405    CBC    Component Value Date/Time   WBC 8.7 04/12/2019 1405   RBC 4.20 04/12/2019 1405   HGB 12.8 04/12/2019 1405   HCT 38.4 04/12/2019 1405   PLT 331 04/12/2019 1405   MCV 91.4 04/12/2019 1405   MCH 30.5 04/12/2019 1405   MCHC 33.3 04/12/2019 1405   RDW 12.0 04/12/2019 1405   LYMPHSABS 1,757 04/12/2019 1405   MONOABS 0.2 09/08/2017 1557   EOSABS 383 04/12/2019 1405   BASOSABS 96 04/12/2019 1405    Baseline Immunosuppressant Therapy Labs TB GOLD Quantiferon TB Gold Latest Ref Rng & Units 06/13/2018  Quantiferon TB Gold Plus NEGATIVE NEGATIVE   Hepatitis Panel Hepatitis Latest Ref Rng & Units 09/08/2016  Hep B Surface Ag NEGATIVE NEGATIVE  Hep B IgM NON REACTIVE NON REACTIVE  Hep C Ab NEGATIVE NEGATIVE  Hep A IgM NON REACTIVE NON REACTIVE   HIV No results found for: HIV Immunoglobulins Immunoglobulin Electrophoresis Latest Ref Rng & Units 09/08/2016  IgG 694 - 1,618 mg/dL 837  IgM 48 - 271 mg/dL 37(L)   SPEP Serum Protein Electrophoresis Latest Ref Rng & Units 04/12/2019  Total Protein 6.1 - 8.1 g/dL 6.9  Albumin 3.8 - 4.8 g/dL -  Alpha-1 0.2 - 0.3 g/dL -  Alpha-2 0.5 - 0.9 g/dL -  Beta Globulin 0.4 - 0.6 g/dL -  Beta 2 0.2 - 0.5 g/dL -  Gamma Globulin 0.8 -  1.7 g/dL -  Interpretation - -   G6PD No results found for: G6PDH TPMT No results found for: TPMT   Chest x-ray: no active cardiopulmonary disease  Patient running a fever or have signs/symptoms of infection? No  Assessment/Plan:  Demonstrated proper injection technique with Enbrel Sureclick demo pen.  Patient able to demonstrate proper injection technique using the teach back method.  Patient self injected in the Enbrel Sureclick with:  Sample Medication: Enbrel Sureclick [place]: [id number] Lot: [id number] Expiration: 03/22  Patient tolerated well.  Observed for 30 mins in office for adverse reaction and none noted.   Patient is to return in 1 month for labs.  Standing orders placed. Prescription already called in to [facility].  All questions encouraged and answered.  Instructed patient to call with any further questions or concerns.  Mariella Saa, PharmD, [facility] Rheumatology Clinical Pharmacist  06/25/2019 10:41 AM

## 2019-07-11 ENCOUNTER — Telehealth: Payer: Self-pay | Admitting: Rheumatology

## 2019-07-11 NOTE — Telephone Encounter (Signed)
Patient calling because of her refill on Enbrel. Per patient, Dance movement psychotherapist [employer] states application is pending. She cannot get medication until it gets cleared up. Please call to advise status of this.

## 2019-07-11 NOTE — Telephone Encounter (Signed)
Called [employer], patient did not call in for refill until this week and [id number] application is still pending. Placed request for application to be expedited as patient need refill asap. Rep processed application while I was on the phone and will now call patient now to schedule her shipment. Will follow up with patient to confirm later today.  [place] Phone# [phone]

## 2019-07-11 NOTE — Telephone Encounter (Signed)
Called patient, she was able to schedule shipment with [employer]. Closing Encounter.

## 2019-07-11 NOTE — Telephone Encounter (Signed)
Received fax from [employer], application has been APPROVED. Coverage is from 07/11/19 to 07/04/20.  Will send document to scan Center.  Phone# [phone] Fax# [phone]

## 2019-07-11 NOTE — Telephone Encounter (Signed)
Patient calling because she is unable to get her refill on Enbrel at this time due to patient assistance application pending approval. Patient is requesting samples until she can get refill. Please call to advise.

## 2019-07-13 ENCOUNTER — Ambulatory Visit: Payer: Medicare Other | Admitting: Physician Assistant

## 2019-07-19 NOTE — Telephone Encounter (Signed)
Annette Suarez, did you see this message?

## 2019-07-19 NOTE — Telephone Encounter (Signed)
Patient states she has received her shipment from [facility] and received Enbrel Mini. Patient states she would like the Enbrel Sureclick. Patient states she believes the Enbrel is starting to help.

## 2019-07-25 ENCOUNTER — Ambulatory Visit: Payer: Medicare Other | Admitting: Rheumatology

## 2019-07-25 LAB — COMPREHENSIVE METABOLIC PANEL
Albumin: 4.6 (ref 3.5–5.0)
Calcium: 9.2 (ref 8.7–10.7)
GFR calc Af Amer: 74
GFR calc non Af Amer: 61

## 2019-07-25 LAB — BASIC METABOLIC PANEL
BUN: 15 (ref 4–21)
CO2: 27 — AB (ref 13–22)
Chloride: 102 (ref 99–108)
Creatinine: 0.9 (ref 0.5–1.1)
Glucose: 116
Potassium: 4 (ref 3.4–5.3)
Sodium: 138 (ref 137–147)

## 2019-07-25 LAB — HEPATIC FUNCTION PANEL
ALT: 17 (ref 7–35)
AST: 18 (ref 13–35)
Alkaline Phosphatase: 83 (ref 25–125)
Bilirubin, Total: 1.1

## 2019-07-25 LAB — HEMOGLOBIN A1C: Hemoglobin A1C: 7.9

## 2019-07-25 LAB — TSH: TSH: 1.39 (ref 0.41–5.90)

## 2019-08-02 ENCOUNTER — Telehealth: Payer: Self-pay | Admitting: Rheumatology

## 2019-08-02 NOTE — Telephone Encounter (Signed)
Sharyn Lull from [employer] left a voicemail stating patient reported a damaged Enbrel mini cartridge.  Sharyn Lull states it was reported to the manufacturer and they will replace it free of charge once we receive a prescription to dispense.  Please call  [phone]

## 2019-08-02 NOTE — Telephone Encounter (Signed)
Returned the call to [place] and was provided Case [id number] and was advised to leave a voicemail on the pharmacy line with case number and authorization to replace the cartridge. When transferred, I left the requested voicemail to replace the cartridge.

## 2019-08-07 ENCOUNTER — Telehealth: Payer: Self-pay | Admitting: *Deleted

## 2019-08-07 NOTE — Telephone Encounter (Signed)
Received TB Gold results drawn by [facility] on 07/25/19.   TB Gold: Negative  Reviewed by Hazel Sams, PA-C

## 2019-09-03 NOTE — Progress Notes (Signed)
Office Visit Note  Patient: Annette Suarez             Date of Birth: Nov 17, 1940           MRN: [id number]             PCP: Shirline Frees, MD Referring: Shirline Frees, MD Visit Date: 09/07/2019 Occupation: @GUAROCC @  Subjective:  Right shoulder joint pain  History of Present Illness: Annette Suarez is a 78 y.o. female with history of seronegative rheumatoid arthritis and osteoarthritis.  She is on Enbrel 50 mg subcutaneous injections once weekly.  She restarted on Enbrel on 06/25/2019 after being off of Enbrel since her visit 2020 due to concern for COVID-19.  She discontinued Arava once she started on Enbrel.  She has no significant improvement since restarting on Enbrel.  She states she experiences nocturnal pain in the right shoulder when lying on her right side but denies any discomfort at this time.  She is not having any joint pain or inflammation at this time.  She states she continues to have chronic fatigue but has been walking for exercise.   Activities of Daily Living:  Patient reports morning stiffness for 1-2 hours.   Patient Denies nocturnal pain.  Difficulty dressing/grooming: Denies Difficulty climbing stairs: Denies Difficulty getting out of chair: Denies Difficulty using hands for taps, buttons, cutlery, and/or writing: Reports  Review of Systems  Constitutional: Positive for fatigue.  HENT: Negative for mouth sores, mouth dryness and nose dryness.   Eyes: Negative for pain, itching, visual disturbance and dryness.  Respiratory: Positive for wheezing. Negative for cough, hemoptysis, shortness of breath and difficulty breathing.   Cardiovascular: Negative for chest pain, palpitations, hypertension and swelling in legs/feet.  Gastrointestinal: Negative for blood in stool, constipation and diarrhea.  Endocrine: Negative for increased urination.  Genitourinary: Negative for difficulty urinating and painful urination.  Musculoskeletal: Positive for arthralgias, joint  pain and morning stiffness. Negative for joint swelling, myalgias, muscle weakness, muscle tenderness and myalgias.  Skin: Negative for color change, pallor, rash, hair loss, nodules/bumps, skin tightness, ulcers and sensitivity to sunlight.  Allergic/Immunologic: Negative for susceptible to infections.  Neurological: Negative for dizziness, numbness, headaches, memory loss and weakness.  Hematological: Negative for bruising/bleeding tendency and swollen glands.  Psychiatric/Behavioral: Negative for depressed mood, confusion and sleep disturbance. The patient is not nervous/anxious.     PMFS History:  Patient Active Problem List   Diagnosis Date Noted  . Primary osteoarthritis of both feet 12/13/2017  . Dehydration 09/09/2017  . Acute lower UTI 09/09/2017  . Nausea & vomiting 09/08/2017  . Primary osteoarthritis of both hands 04/05/2017  . Left carpal tunnel syndrome 11/15/2016  . Rheumatoid arthritis of multiple sites with negative rheumatoid factor ([place]) 10/06/2016  . High risk medication use 10/06/2016  . Dyslipidemia 10/06/2016  . Pain in both hands 09/08/2016  . Essential hypertension 09/08/2016  . Type 2 diabetes mellitus  09/08/2016  . Gastroesophageal reflux disease without esophagitis 09/08/2016  . Osteopenia of multiple sites 09/08/2016  . History of anxiety 09/08/2016  . Benign paroxysmal positional vertigo 09/08/2016  . Smoker 09/08/2016  . Sleep apnea 09/08/2016    Past Medical History:  Diagnosis Date  . Diabetes mellitus without complication ([place])   . Goiter, nontoxic, multinodular   . Hypertension   . RA (rheumatoid arthritis) ([facility])     Family History  Problem Relation Age of Onset  . Hypertension Other   . Diabetes Mother   . Heart disease Father   .  Diabetes Brother   . Cancer Sister        breast   . Diabetes Son   . Crohn's disease Son    Past Surgical History:  Procedure Laterality Date  . CHOLECYSTECTOMY    . SQUAMOUS CELL CARCINOMA EXCISION   05/2018   Social History   Social History Narrative  . Not on file    There is no immunization history on file for this patient.   Objective: Vital Signs: BP (!) 121/92 (BP Location: Left Arm, Patient Position: Sitting, Cuff Size: Normal)   Pulse 96   Resp 13   Ht 5\' 2"  (1.575 m)   Wt 165 lb (74.8 kg)   BMI 30.18 kg/m    Physical Exam Vitals and nursing note reviewed.  Constitutional:      Appearance: She is well-developed.  HENT:     Head: Normocephalic and atraumatic.  Eyes:     Conjunctiva/sclera: Conjunctivae normal.  Pulmonary:     Effort: Pulmonary effort is normal.  Abdominal:     General: Bowel sounds are normal.     Palpations: Abdomen is soft.  Musculoskeletal:     Cervical back: Normal range of motion.  Lymphadenopathy:     Cervical: No cervical adenopathy.  Skin:    General: Skin is warm and dry.     Capillary Refill: Capillary refill takes less than 2 seconds.  Neurological:     Mental Status: She is alert and oriented to person, place, and time.  Psychiatric:        Behavior: Behavior normal.     Musculoskeletal Exam: C-spine, thoracic spine, lumbar spine good range of motion.  No midline spinal tenderness.  Shoulder joints, ligaments, wrist joints, MCPs, PIPs, DIPs good range of motion no synovitis.  She has PIP and DIP thickening consistent with osteoarthritis of both hands.  Hip joints, knee joints, ankle joints, MTPs and PIPs and DIPs good range of motion with no synovitis.  No warmth or effusion of bilateral knee joints.  No tenderness or swelling of ankle joints.  No tenderness over trochanteric bursa bilaterally.  CDAI Exam: CDAI Score: 0.8  Patient Global: 4 mm; Provider Global: 4 mm Swollen: 0 ; Tender: 0  Joint Exam 09/07/2019   No joint exam has been documented for this visit   There is currently no information documented on the homunculus. Go to the Rheumatology activity and complete the homunculus joint exam.  Investigation: No  additional findings.  Imaging: No results found.  Recent Labs: Lab Results  Component Value Date   WBC 8.7 04/12/2019   HGB 12.8 04/12/2019   PLT 331 04/12/2019   NA 138 04/12/2019   K 4.7 04/12/2019   CL 103 04/12/2019   CO2 27 04/12/2019   GLUCOSE 186 (H) 04/12/2019   BUN 15 04/12/2019   CREATININE 0.91 04/12/2019   BILITOT 0.6 04/12/2019   ALKPHOS 56 09/09/2017   AST 18 04/12/2019   ALT 14 04/12/2019   PROT 6.9 04/12/2019   ALBUMIN 3.2 (L) 09/09/2017   CALCIUM 9.3 04/12/2019   GFRAA 71 04/12/2019   QFTBGOLDPLUS NEGATIVE 06/13/2018    Speciality Comments: Recieves Enbrel through [facility] 02/20/2019 Osteopenia managed by Dr. Annice Pih 02/20/2019  Procedures:  No procedures performed Allergies: Latex   Assessment / Plan:     Visit Diagnoses: Rheumatoid arthritis of multiple sites with negative rheumatoid factor ([place]): She has no synovitis on exam today.  She has not had any recent rheumatoid arthritis flares since restarting  on Enbrel on 06/25/2019.  She discontinued Arava once starting on Enbrel.  She has noticed a significant improvement since restarting on Enbrel after holding it since August 2020 due to the concern for COVID-19.  She experiences occasional nocturnal pain in the right shoulder when lying on her right side but is not having any other discomfort at this time.  She has no inflammation on exam today.  She will continue on Enbrel 50 mg subcutaneous injections once weekly.  She will notify us if she does increase joint pain or joint swelling.  She will follow-up in the office in 5 months  High risk medication use - Enbrel 50 mg sq injections once weekly. (inadequate response to [place]).  CBC and CMP were drawn on 04/12/2019.  She is due to update CBC and CMP today.  She will return in June and every 3 months to monitor for drug toxicity.  TB gold was negative on 07/25/2019.  We discussed the importance of holding Enbrel if she develops any signs  or symptoms of an infection and to resume once the infection is completely cleared.- Plan: CBC with Differential/Platelet, COMPLETE METABOLIC PANEL WITH GFR  Primary osteoarthritis of both hands: She has PIP and DIPs sickening consistent with osteoarthritis of both hands.  No tenderness or synovitis was noted.  She has complete fist formation bilaterally.  Joint protection and muscle strengthening were discussed.  Paresthesia of both hands: She has been experiencing less frequent paresthesias since restarting on Enbrel.  No wrist tenderness or synovitis was noted on exam today.  Primary osteoarthritis of both feet: She is not experiencing any discomfort in her feet at this time.  She was proper fitting shoes.  Osteopenia of multiple sites - Managed by Dr. Kenton Kingfisher. She is on a daily calcium/vitamin D supplement. Last DEXA in March 2018 was stable.Due for repeat DEXA.  Trochanteric bursitis, right hip: She has intermittent right trochanter bursitis.  She has no tenderness on exam today.  Other medical conditions are listed as follows:   Gastroesophageal reflux disease without esophagitis  History of sleep apnea  History of vertigo  Essential hypertension  History of hyperlipidemia  Type 2 diabetes mellitus   Former smoker  History of anxiety  Orders: Orders Placed This Encounter  Procedures  . CBC with Differential/Platelet  . COMPLETE METABOLIC PANEL WITH GFR   No orders of the defined types were placed in this encounter.     Follow-Up Instructions: Return in about 5 months (around 02/07/2020) for Rheumatoid arthritis, Osteoarthritis.   Ofilia Neas, PA-C   I examined and evaluated the patient with Hazel Sams PA.  Patient is doing much better on combination therapy now.  She had no synovitis on my examination.  We will continue current regimen.  The plan of care was discussed as noted above.  Bo Merino, MD  Note - This record has been created using [employer].  Chart creation errors have been sought, but may not always  have been located. Such creation errors do not reflect on  the standard of medical care.

## 2019-09-07 ENCOUNTER — Ambulatory Visit: Payer: Medicare Other | Admitting: Rheumatology

## 2019-09-07 ENCOUNTER — Encounter: Payer: Self-pay | Admitting: Rheumatology

## 2019-09-07 ENCOUNTER — Other Ambulatory Visit: Payer: Self-pay

## 2019-09-07 VITALS — BP 121/92 | HR 96 | Resp 13 | Ht 62.0 in | Wt 165.0 lb

## 2019-09-07 DIAGNOSIS — E118 Type 2 diabetes mellitus with unspecified complications: Secondary | ICD-10-CM

## 2019-09-07 DIAGNOSIS — M7061 Trochanteric bursitis, right hip: Secondary | ICD-10-CM

## 2019-09-07 DIAGNOSIS — Z87891 Personal history of nicotine dependence: Secondary | ICD-10-CM

## 2019-09-07 DIAGNOSIS — M0609 Rheumatoid arthritis without rheumatoid factor, multiple sites: Secondary | ICD-10-CM

## 2019-09-07 DIAGNOSIS — Z79899 Other long term (current) drug therapy: Secondary | ICD-10-CM

## 2019-09-07 DIAGNOSIS — M19071 Primary osteoarthritis, right ankle and foot: Secondary | ICD-10-CM

## 2019-09-07 DIAGNOSIS — R202 Paresthesia of skin: Secondary | ICD-10-CM | POA: Diagnosis not present

## 2019-09-07 DIAGNOSIS — Z8669 Personal history of other diseases of the nervous system and sense organs: Secondary | ICD-10-CM

## 2019-09-07 DIAGNOSIS — M8589 Other specified disorders of bone density and structure, multiple sites: Secondary | ICD-10-CM

## 2019-09-07 DIAGNOSIS — Z8659 Personal history of other mental and behavioral disorders: Secondary | ICD-10-CM

## 2019-09-07 DIAGNOSIS — K219 Gastro-esophageal reflux disease without esophagitis: Secondary | ICD-10-CM

## 2019-09-07 DIAGNOSIS — Z8639 Personal history of other endocrine, nutritional and metabolic disease: Secondary | ICD-10-CM

## 2019-09-07 DIAGNOSIS — I10 Essential (primary) hypertension: Secondary | ICD-10-CM

## 2019-09-07 DIAGNOSIS — Z794 Long term (current) use of insulin: Secondary | ICD-10-CM

## 2019-09-07 DIAGNOSIS — Z87898 Personal history of other specified conditions: Secondary | ICD-10-CM

## 2019-09-07 DIAGNOSIS — M19072 Primary osteoarthritis, left ankle and foot: Secondary | ICD-10-CM

## 2019-09-07 DIAGNOSIS — M19041 Primary osteoarthritis, right hand: Secondary | ICD-10-CM | POA: Diagnosis not present

## 2019-09-07 DIAGNOSIS — M19042 Primary osteoarthritis, left hand: Secondary | ICD-10-CM

## 2019-09-07 MED ORDER — ENBREL SURECLICK 50 MG/ML ~~LOC~~ SOAJ: 50.0000 mg | SUBCUTANEOUS | 0 refills | Status: DC

## 2019-09-07 NOTE — Patient Instructions (Signed)
Standing Labs We placed an order today for your standing lab work.    Please come back and get your standing labs in June and every 3 months.   We have open lab daily Monday through Thursday from 8:30-12:30 PM and 1:30-4:30 PM and Friday from 8:30-12:30 PM and 1:30-4:00 PM at the office of Dr. Shaili Deveshwar.   You may experience shorter wait times on Monday and Friday afternoons. The office is located at [address], [address], [place], [place] [address] No appointment is necessary.   Labs are drawn by [facility].  You may receive a bill from [facility] for your lab work.  If you wish to have your labs drawn at another location, please call the office 24 hours in advance to send orders.  If you have any questions regarding directions or hours of operation,  please call [phone].   Just as a reminder please drink plenty of water prior to coming for your lab work. Thanks!  

## 2019-09-08 LAB — COMPLETE METABOLIC PANEL WITH GFR
AG Ratio: 1.8 (calc) (ref 1.0–2.5)
ALT: 11 U/L (ref 6–29)
AST: 14 U/L (ref 10–35)
Albumin: 4.2 g/dL (ref 3.6–5.1)
Alkaline phosphatase (APISO): 82 U/L (ref 37–153)
BUN/Creatinine Ratio: 21 (calc) (ref 6–22)
BUN: 21 mg/dL (ref 7–25)
CO2: 26 mmol/L (ref 20–32)
Calcium: 9 mg/dL (ref 8.6–10.4)
Chloride: 104 mmol/L (ref 98–110)
Creat: 0.98 mg/dL — ABNORMAL HIGH (ref 0.60–0.93)
GFR, Est African American: 65 mL/min/{1.73_m2} (ref >=60)
GFR, Est Non African American: 56 mL/min/{1.73_m2} — ABNORMAL LOW (ref >=60)
Globulin: 2.4 g/dL (calc) (ref 1.9–3.7)
Glucose, Bld: 129 mg/dL — ABNORMAL HIGH (ref 65–99)
Potassium: 4.4 mmol/L (ref 3.5–5.3)
Sodium: 140 mmol/L (ref 135–146)
Total Bilirubin: 0.9 mg/dL (ref 0.2–1.2)
Total Protein: 6.6 g/dL (ref 6.1–8.1)

## 2019-09-08 LAB — CBC WITH DIFFERENTIAL/PLATELET
Absolute Monocytes: 673 cells/uL (ref 200–950)
Basophils Absolute: 89 cells/uL (ref 0–200)
Basophils Relative: 0.9 %
Eosinophils Absolute: 317 cells/uL (ref 15–500)
Eosinophils Relative: 3.2 %
HCT: 37.6 % (ref 35.0–45.0)
Hemoglobin: 12.9 g/dL (ref 11.7–15.5)
Lymphs Abs: 2396 cells/uL (ref 850–3900)
MCH: 31.2 pg (ref 27.0–33.0)
MCHC: 34.3 g/dL (ref 32.0–36.0)
MCV: 91 fL (ref 80.0–100.0)
MPV: 11.1 fL (ref 7.5–12.5)
Monocytes Relative: 6.8 %
Neutro Abs: 6425 cells/uL (ref 1500–7800)
Neutrophils Relative %: 64.9 %
Platelets: 287 10*3/uL (ref 140–400)
RBC: 4.13 10*6/uL (ref 3.80–5.10)
RDW: 12.4 % (ref 11.0–15.0)
Total Lymphocyte: 24.2 %
WBC: 9.9 10*3/uL (ref 3.8–10.8)

## 2019-09-10 ENCOUNTER — Telehealth: Payer: Self-pay | Admitting: Rheumatology

## 2019-09-10 NOTE — Telephone Encounter (Signed)
Patient called stating she was returning your call.   °

## 2019-09-10 NOTE — Telephone Encounter (Signed)
Returned patient's call and reviewed lab results with patient.

## 2019-09-10 NOTE — Progress Notes (Signed)
CBC WNL.  Creatinine is borderline elevated. GFR is WNL. Please advise patient to avoid NSAIDs.   Glucose is elevated at 129.  Rest of CMP WNL.

## 2019-09-13 ENCOUNTER — Other Ambulatory Visit: Payer: Self-pay | Admitting: *Deleted

## 2019-09-13 DIAGNOSIS — M0609 Rheumatoid arthritis without rheumatoid factor, multiple sites: Secondary | ICD-10-CM

## 2019-09-13 MED ORDER — ENBREL SURECLICK 50 MG/ML ~~LOC~~ SOAJ: 50.0000 mg | SUBCUTANEOUS | 0 refills | Status: DC

## 2019-09-13 NOTE — Telephone Encounter (Signed)
Received a fax from [facility] stating they received a a prescription that was to blurry to read. Prescription was sent via e-scribe on 09/07/19. Resending prescription.

## 2019-10-02 ENCOUNTER — Telehealth: Payer: Self-pay | Admitting: Rheumatology

## 2019-10-02 NOTE — Telephone Encounter (Signed)
Called a verbal prescription to [facility].

## 2019-10-02 NOTE — Telephone Encounter (Signed)
[facility] called stating they were unable to read patient's prescription of Enbrel.  They are requesting a clearer copy be resent or you can call it in verbally to [phone]

## 2019-11-13 ENCOUNTER — Telehealth: Payer: Self-pay | Admitting: Rheumatology

## 2019-11-13 NOTE — Telephone Encounter (Signed)
Patient called stating she was prescribed an antibiotic for a UTI and is checking if it is okay for her to take her Enbrel injection today.  Patient requested a return call.

## 2019-11-13 NOTE — Telephone Encounter (Signed)
Patient states she is on an antibiotic for UTI for 7 days. Patient advised to hold her Enbrel until she has completed the antibiotic and is well. Patient verbalized understanding.

## 2019-11-26 ENCOUNTER — Telehealth: Payer: Self-pay | Admitting: Rheumatology

## 2019-11-26 LAB — BASIC METABOLIC PANEL: Glucose: 186

## 2019-11-26 LAB — HEMOGLOBIN A1C: Hemoglobin A1C: 8.1

## 2019-11-26 NOTE — Telephone Encounter (Signed)
Patient called stating her Urologist put her on low dose antibiotic to take for 3 months and patient is checking if it will interfere with her Enbrel.  Patient states she doesn't have a current infection.  Patient states the antibiotic is to prevent another UTI.   Patient states she can be reached at [phone] before 11:00 and [phone] after 11:00.

## 2019-11-26 NOTE — Telephone Encounter (Signed)
Advised patient She may continue Enbrel as the antibiotic is for prevention. If she develops an active infection she should hold Enbrel until infection resolves. Patient verbalized understanding.

## 2019-11-26 NOTE — Telephone Encounter (Signed)
She may continue Enbrel as the antibiotic is for prevention. If she develops an active infection she should hold Enbrel until infection resolves.   Mariella Saa, PharmD, [place], CPP Clinical Specialty Pharmacist (Rheumatology and Pulmonology)  11/26/2019 10:01 AM

## 2019-12-28 ENCOUNTER — Other Ambulatory Visit: Payer: Self-pay | Admitting: Rheumatology

## 2019-12-28 DIAGNOSIS — M0609 Rheumatoid arthritis without rheumatoid factor, multiple sites: Secondary | ICD-10-CM

## 2019-12-28 MED ORDER — ENBREL SURECLICK 50 MG/ML ~~LOC~~ SOAJ: 50.0000 mg | SUBCUTANEOUS | 0 refills | Status: DC

## 2019-12-28 NOTE — Telephone Encounter (Signed)
Ok to refill 30 day supply of Enbrel 50 mg sq weekly injections.

## 2019-12-28 NOTE — Telephone Encounter (Signed)
Verified verbal prescription with Hazel Sams, PA-C to send in Enbrel 50 mg sq weekly injections # 4 pens with no refills.

## 2019-12-28 NOTE — Telephone Encounter (Signed)
Margreta Journey from [facility] left a voicemail stating a new prescription is needed for patient's Enbrel.  Please return our call to [phone] Monday-Friday 8:00 am - 8:00 pm EST

## 2019-12-28 NOTE — Telephone Encounter (Signed)
Last Visit: 09/07/2019 Next Visit: 02/08/2020 Labs: 09/07/2019 CBC WNL. Creatinine is borderline elevated. GFR is WNL. Glucose is elevated at 129. Rest of CMP WNL. TB Gold: 07/25/2019 Neg   Current Dose per office note 09/07/2019: Enbrel 50 mg sq injections once weekly  [id number] arthritis   Attempted to contact the patient and left message to advise patient she is due to update labs.   Okay to refill 30 day supply Enbrel?

## 2019-12-28 NOTE — Telephone Encounter (Signed)
Routing to clinical staff.

## 2019-12-31 ENCOUNTER — Other Ambulatory Visit: Payer: Self-pay

## 2019-12-31 DIAGNOSIS — Z79899 Other long term (current) drug therapy: Secondary | ICD-10-CM

## 2020-01-01 LAB — COMPLETE METABOLIC PANEL WITH GFR
AG Ratio: 1.7 (calc) (ref 1.0–2.5)
ALT: 13 U/L (ref 6–29)
AST: 11 U/L (ref 10–35)
Albumin: 4.2 g/dL (ref 3.6–5.1)
Alkaline phosphatase (APISO): 88 U/L (ref 37–153)
BUN/Creatinine Ratio: 15 (calc) (ref 6–22)
BUN: 17 mg/dL (ref 7–25)
CO2: 28 mmol/L (ref 20–32)
Calcium: 9.4 mg/dL (ref 8.6–10.4)
Chloride: 101 mmol/L (ref 98–110)
Creat: 1.14 mg/dL — ABNORMAL HIGH (ref 0.60–0.93)
GFR, Est African American: 54 mL/min/{1.73_m2} — ABNORMAL LOW (ref >=60)
GFR, Est Non African American: 47 mL/min/{1.73_m2} — ABNORMAL LOW (ref >=60)
Globulin: 2.5 g/dL (calc) (ref 1.9–3.7)
Glucose, Bld: 197 mg/dL — ABNORMAL HIGH (ref 65–99)
Potassium: 4.6 mmol/L (ref 3.5–5.3)
Sodium: 138 mmol/L (ref 135–146)
Total Bilirubin: 1.1 mg/dL (ref 0.2–1.2)
Total Protein: 6.7 g/dL (ref 6.1–8.1)

## 2020-01-01 LAB — CBC WITH DIFFERENTIAL/PLATELET
Absolute Monocytes: 612 cells/uL (ref 200–950)
Basophils Absolute: 81 cells/uL (ref 0–200)
Basophils Relative: 0.9 %
Eosinophils Absolute: 279 cells/uL (ref 15–500)
Eosinophils Relative: 3.1 %
HCT: 37.7 % (ref 35.0–45.0)
Hemoglobin: 12.9 g/dL (ref 11.7–15.5)
Lymphs Abs: 2376 cells/uL (ref 850–3900)
MCH: 31.3 pg (ref 27.0–33.0)
MCHC: 34.2 g/dL (ref 32.0–36.0)
MCV: 91.5 fL (ref 80.0–100.0)
MPV: 10.6 fL (ref 7.5–12.5)
Monocytes Relative: 6.8 %
Neutro Abs: 5652 cells/uL (ref 1500–7800)
Neutrophils Relative %: 62.8 %
Platelets: 292 10*3/uL (ref 140–400)
RBC: 4.12 10*6/uL (ref 3.80–5.10)
RDW: 12.6 % (ref 11.0–15.0)
Total Lymphocyte: 26.4 %
WBC: 9 10*3/uL (ref 3.8–10.8)

## 2020-01-01 NOTE — Progress Notes (Signed)
Glucose is elevated-197.  Creatinine is elevated-1.14.  GFR is low-47.  Please notify the patient and advise her to avoid NSAIDs.  Please forward lab work to PCP.   CBC WNL

## 2020-01-17 ENCOUNTER — Other Ambulatory Visit: Payer: Self-pay | Admitting: *Deleted

## 2020-01-17 DIAGNOSIS — M0609 Rheumatoid arthritis without rheumatoid factor, multiple sites: Secondary | ICD-10-CM

## 2020-01-17 MED ORDER — ENBREL SURECLICK 50 MG/ML ~~LOC~~ SOAJ: 50.0000 mg | SUBCUTANEOUS | 0 refills | Status: DC

## 2020-01-17 NOTE — Telephone Encounter (Signed)
Patient contacted the office requesting a refill on Enbrel to be sent to [facility].  Last Visit: 09/07/2019 Next Visit: 02/08/2020 Labs: 12/31/2019 Glucose is elevated-197. Creatinine is elevated-1.14. GFR is low-47. CBC WNL TB Gold: 07/25/2019 Neg   Current Dose per office note 09/07/2019: Enbrel 50 mg sq injections once weekly  DX: Rheumatoid arthritis   Okay to refill Enbrel?

## 2020-01-21 ENCOUNTER — Telehealth: Payer: Self-pay | Admitting: Rheumatology

## 2020-01-21 NOTE — Telephone Encounter (Signed)
Patient called back stating she meant to say prefilled syringes.  Patient states she is confused over the two different types of injections and requesting a return call.

## 2020-01-21 NOTE — Telephone Encounter (Signed)
Patient advised her prescription was sent to [facility] on 01/17/2020. Patient advised the prescription has been sent for Enbrel [facility]. Patient will call to set up shipment.

## 2020-01-21 NOTE — Telephone Encounter (Signed)
Patient called requesting a return call to confirm that the prescription refill of Enbrel was for pre-filled mini cartridges.

## 2020-01-22 LAB — LIPID PANEL
Cholesterol: 126 (ref 0–200)
HDL: 44 (ref 35–70)
LDL Cholesterol: 55
Triglycerides: 164 — AB (ref 40–160)

## 2020-01-22 LAB — BASIC METABOLIC PANEL: Glucose: 169

## 2020-01-22 LAB — TSH: TSH: 2.25 (ref 0.41–5.90)

## 2020-01-22 LAB — HEMOGLOBIN A1C: Hemoglobin A1C: 7.5

## 2020-01-25 NOTE — Progress Notes (Signed)
Office Visit Note  Patient: Annette Suarez             Date of Birth: [birth year]           MRN: [id number]             PCP: Shirline Frees, MD Referring: Shirline Frees, MD Visit Date: 02/08/2020 Occupation: @GUAROCC @  Subjective:  Other (soreness in shoulders and neck )   History of Present Illness: Annette Suarez is a 79 y.o. female history of rheumatoid arthritis and osteoarthritis.  She denies any joint swelling.  She states she has been asked having some discomfort across her trapezius region.  She has some discomfort in her shoulder region.  She has osteoarthritis in her hands and feet which causes discomfort off and on.  Her right trochanteric bursa pain has improved but she flares off and on with walking.  Activities of Daily Living:  Patient reports morning stiffness for 2-3 hours.   Patient Reports nocturnal pain.  Difficulty dressing/grooming: Denies Difficulty climbing stairs: Denies Difficulty getting out of chair: Denies Difficulty using hands for taps, buttons, cutlery, and/or writing: Reports  Review of Systems  Constitutional: Positive for fatigue. Negative for night sweats, weight gain and weight loss.  HENT: Negative for mouth sores, trouble swallowing, trouble swallowing, mouth dryness and nose dryness.   Eyes: Negative for pain, redness, itching, visual disturbance and dryness.  Respiratory: Negative for cough, shortness of breath and difficulty breathing.   Cardiovascular: Negative for chest pain, palpitations, hypertension, irregular heartbeat and swelling in legs/feet.  Gastrointestinal: Negative for blood in stool, constipation and diarrhea.  Endocrine: Negative for increased urination.  Genitourinary: Negative for difficulty urinating and vaginal dryness.  Musculoskeletal: Positive for arthralgias, joint pain, myalgias, morning stiffness, muscle tenderness and myalgias. Negative for joint swelling and muscle weakness.  Skin: Negative for color change, rash,  hair loss, redness, skin tightness, ulcers and sensitivity to sunlight.  Allergic/Immunologic: Negative for susceptible to infections.  Neurological: Positive for numbness and weakness. Negative for dizziness, headaches, memory loss and night sweats.  Hematological: Positive for bruising/bleeding tendency. Negative for swollen glands.  Psychiatric/Behavioral: Negative for depressed mood, confusion and sleep disturbance. The patient is not nervous/anxious.     PMFS History:  Patient Active Problem List   Diagnosis Date Noted  . Primary osteoarthritis of both feet 12/13/2017  . Dehydration 09/09/2017  . Acute lower UTI 09/09/2017  . Nausea & vomiting 09/08/2017  . Primary osteoarthritis of both hands 04/05/2017  . Left carpal tunnel syndrome 11/15/2016  . Rheumatoid arthritis of multiple sites with negative rheumatoid factor ([place]) 10/06/2016  . High risk medication use 10/06/2016  . Dyslipidemia 10/06/2016  . Pain in both hands 09/08/2016  . Essential hypertension 09/08/2016  . Type 2 diabetes mellitus  09/08/2016  . Gastroesophageal reflux disease without esophagitis 09/08/2016  . Osteopenia of multiple sites 09/08/2016  . History of anxiety 09/08/2016  . Benign paroxysmal positional vertigo 09/08/2016  . Smoker 09/08/2016  . Sleep apnea 09/08/2016    Past Medical History:  Diagnosis Date  . Diabetes mellitus without complication ([facility])   . Goiter, nontoxic, multinodular   . Hypertension   . RA (rheumatoid arthritis) ([facility])     Family History  Problem Relation Age of Onset  . Hypertension Other   . Diabetes Mother   . Heart disease Father   . Diabetes Brother   . Cancer Sister        breast   . Diabetes Son   .  Crohn's disease Son    Past Surgical History:  Procedure Laterality Date  . CHOLECYSTECTOMY    . SQUAMOUS CELL CARCINOMA EXCISION  05/2018   Social History   Social History Narrative  . Not on file    There is no immunization history on file for this  patient.   Objective: Vital Signs: BP (!) 147/79 (BP Location: Left Arm, Patient Position: Sitting, Cuff Size: Normal)   Pulse 88   Resp 15   Ht 5\' 1"  (1.549 m)   Wt 162 lb 9.6 oz (73.8 kg)   BMI 30.72 kg/m    Physical Exam Vitals and nursing note reviewed.  Constitutional:      Appearance: She is well-developed.  HENT:     Head: Normocephalic and atraumatic.  Eyes:     Conjunctiva/sclera: Conjunctivae normal.  Cardiovascular:     Rate and Rhythm: Normal rate and regular rhythm.     Heart sounds: Normal heart sounds.  Pulmonary:     Effort: Pulmonary effort is normal.     Breath sounds: Normal breath sounds.  Abdominal:     General: Bowel sounds are normal.     Palpations: Abdomen is soft.  Musculoskeletal:     Cervical back: Normal range of motion.  Lymphadenopathy:     Cervical: No cervical adenopathy.  Skin:    General: Skin is warm and dry.     Capillary Refill: Capillary refill takes less than 2 seconds.  Neurological:     Mental Status: She is alert and oriented to person, place, and time.  Psychiatric:        Behavior: Behavior normal.      Musculoskeletal Exam: C-spine was in good range of motion.  She has discomfort range of motion of her shoulder joints.  Elbow joints, wrist joints with good range of motion.  She is MCP thickening.  She has bilateral CMC PIP and DIP thickening with no synovitis.  Hip joints, knee joints, ankles, MTPs and PIPs with good range of motion with no synovitis.  CDAI Exam: CDAI Score: 0.8  Patient Global: 4 mm; Provider Global: 4 mm Swollen: 0 ; Tender: 0  Joint Exam 02/08/2020   No joint exam has been documented for this visit   There is currently no information documented on the homunculus. Go to the Rheumatology activity and complete the homunculus joint exam.  Investigation: No additional findings.  Imaging: No results found.  Recent Labs: Lab Results  Component Value Date   WBC 9.0 12/31/2019   HGB 12.9  12/31/2019   PLT 292 12/31/2019   NA 138 12/31/2019   K 4.6 12/31/2019   CL 101 12/31/2019   CO2 28 12/31/2019   GLUCOSE 197 (H) 12/31/2019   BUN 17 12/31/2019   CREATININE 1.14 (H) 12/31/2019   BILITOT 1.1 12/31/2019   ALKPHOS 56 09/09/2017   AST 11 12/31/2019   ALT 13 12/31/2019   PROT 6.7 12/31/2019   ALBUMIN 3.2 (L) 09/09/2017   CALCIUM 9.4 12/31/2019   GFRAA 54 (L) 12/31/2019   QFTBGOLDPLUS NEGATIVE 06/13/2018    Speciality Comments: Recieves Enbrel through [facility] 02/20/2019 Osteopenia managed by Dr. Annice Pih 02/20/2019  Procedures:  No procedures performed Allergies: Latex   Assessment / Plan:     Visit Diagnoses: Rheumatoid arthritis of multiple sites with negative rheumatoid factor ([place]) -patient complains of pain and stiffness in her hands.  She had no synovitis.  The discomfort is mostly over DIP and PIP joints due to underlying osteoarthritis.  Will obtain x-rays to monitor for radiographic progression.  Plan: XR Hand 2 View Right, XR Hand 2 View Left, XR Foot 2 Views Right, XR Foot 2 Views Left.  X-ray was consistent with rheumatoid arthritis and osteoarthritis overlap.  No comparison films were available.  High risk medication use - Enbrel 50 mg sq injections once weekly. (inadequate response to [place]).  August 02, 2019 TB Gold negative.  Her labs were normal in June except for low GFR.  We will continue to monitor labs every 3 months.  Patient states she has had COVID-19 vaccination.  She was advised to wear a mask and practice social distancing and hand hygiene.  She was also advised to get booster when available.  In case she develops COVID-19 infection she will be a candidate for antibody infusion.  Chronic pain of both shoulders -she complains of pain and discomfort in her bilateral shoulders.  She has some tenderness on palpation over subacromial region.  Plan: XR Shoulder Left, XR Shoulder Right.  X-rays were unremarkable except for some  acromial spurring.  Have given her handout on shoulder joint exercises.  Primary osteoarthritis of both hands-she has DIP and PIP thickening.  Joint protection muscle strengthening was discussed.  Paresthesia of both hands  Primary osteoarthritis of both feet-proper fitting shoes were discussed.  Osteopenia of multiple sites - Managed by Dr. Kenton Kingfisher. She is on a daily calcium/vitamin D supplement.  Last DEXA in March 2018 was stable.  Due for repeat DEXA.   Trochanteric bursitis, right hip-she is off-and-on discomfort.  Have advised her to do stretching exercises.  Gastroesophageal reflux disease without esophagitis  History of vertigo  History of sleep apnea  Essential hypertension  Type 2 diabetes mellitus   History of hyperlipidemia  History of anxiety  Former smoker  Orders: Orders Placed This Encounter  Procedures  . XR Hand 2 View Right  . XR Hand 2 View Left  . XR Foot 2 Views Right  . XR Foot 2 Views Left  . XR Shoulder Left  . XR Shoulder Right   No orders of the defined types were placed in this encounter.     Follow-Up Instructions: Return in about 5 months (around 07/10/2020) for Rheumatoid arthritis, Osteoarthritis.   Bo Merino, MD  Note - This record has been created using Editor, commissioning.  Chart creation errors have been sought, but may not always  have been located. Such creation errors do not reflect on  the standard of medical care.

## 2020-02-08 ENCOUNTER — Ambulatory Visit: Payer: Self-pay

## 2020-02-08 ENCOUNTER — Other Ambulatory Visit: Payer: Self-pay

## 2020-02-08 ENCOUNTER — Encounter: Payer: Self-pay | Admitting: Rheumatology

## 2020-02-08 ENCOUNTER — Ambulatory Visit: Payer: Medicare Other | Admitting: Rheumatology

## 2020-02-08 VITALS — BP 147/79 | HR 88 | Resp 15 | Ht 61.0 in | Wt 162.6 lb

## 2020-02-08 DIAGNOSIS — M25512 Pain in left shoulder: Secondary | ICD-10-CM

## 2020-02-08 DIAGNOSIS — M19041 Primary osteoarthritis, right hand: Secondary | ICD-10-CM

## 2020-02-08 DIAGNOSIS — M19071 Primary osteoarthritis, right ankle and foot: Secondary | ICD-10-CM | POA: Diagnosis not present

## 2020-02-08 DIAGNOSIS — Z87898 Personal history of other specified conditions: Secondary | ICD-10-CM

## 2020-02-08 DIAGNOSIS — M19072 Primary osteoarthritis, left ankle and foot: Secondary | ICD-10-CM

## 2020-02-08 DIAGNOSIS — Z87891 Personal history of nicotine dependence: Secondary | ICD-10-CM

## 2020-02-08 DIAGNOSIS — Z8659 Personal history of other mental and behavioral disorders: Secondary | ICD-10-CM

## 2020-02-08 DIAGNOSIS — M19042 Primary osteoarthritis, left hand: Secondary | ICD-10-CM

## 2020-02-08 DIAGNOSIS — E118 Type 2 diabetes mellitus with unspecified complications: Secondary | ICD-10-CM

## 2020-02-08 DIAGNOSIS — M0609 Rheumatoid arthritis without rheumatoid factor, multiple sites: Secondary | ICD-10-CM | POA: Diagnosis not present

## 2020-02-08 DIAGNOSIS — Z79899 Other long term (current) drug therapy: Secondary | ICD-10-CM

## 2020-02-08 DIAGNOSIS — M25511 Pain in right shoulder: Secondary | ICD-10-CM

## 2020-02-08 DIAGNOSIS — R202 Paresthesia of skin: Secondary | ICD-10-CM

## 2020-02-08 DIAGNOSIS — Z8639 Personal history of other endocrine, nutritional and metabolic disease: Secondary | ICD-10-CM

## 2020-02-08 DIAGNOSIS — M8589 Other specified disorders of bone density and structure, multiple sites: Secondary | ICD-10-CM

## 2020-02-08 DIAGNOSIS — I10 Essential (primary) hypertension: Secondary | ICD-10-CM

## 2020-02-08 DIAGNOSIS — K219 Gastro-esophageal reflux disease without esophagitis: Secondary | ICD-10-CM

## 2020-02-08 DIAGNOSIS — G8929 Other chronic pain: Secondary | ICD-10-CM

## 2020-02-08 DIAGNOSIS — Z8669 Personal history of other diseases of the nervous system and sense organs: Secondary | ICD-10-CM

## 2020-02-08 DIAGNOSIS — M7061 Trochanteric bursitis, right hip: Secondary | ICD-10-CM

## 2020-02-08 DIAGNOSIS — Z794 Long term (current) use of insulin: Secondary | ICD-10-CM

## 2020-02-08 NOTE — Patient Instructions (Addendum)
Standing Labs We placed an order today for your standing lab work.   Please have your standing labs drawn in September and every 3 months If possible, please have your labs drawn 2 weeks prior to your appointment so that the provider can discuss your results at your appointment.  We have open lab daily Monday through Thursday from 8:30-12:30 PM and 1:30-4:30 PM and Friday from 8:30-12:30 PM and 1:30-4:00 PM at the office of Dr. Bo Merino, [facility].   Please be advised, patients with office appointments requiring lab work will take precedents over walk-in lab work.  If possible, please come for your lab work on Monday and Friday afternoons, as you may experience shorter wait times. The office is located at [address], [place], [place], [place] [address] No appointment is necessary.   Labs are drawn by [facility]. Please bring your co-pay at the time of your lab draw.  You may receive a bill from [facility] for your lab work.  If you wish to have your labs drawn at another location, please call the office 24 hours in advance to send orders.  If you have any questions regarding directions or hours of operation,  please call [phone].   As a reminder, please drink plenty of water prior to coming for your lab work. Thanks!  COVID-19 vaccine recommendations:   COVID-19 vaccine is recommended for everyone (unless you are allergic to a vaccine component), even if you are on a medication that suppresses your immune system.   If you are on Methotrexate, Cellcept (mycophenolate), Rinvoq, Morrie Sheldon, and Olumiant- hold the medication for 1 week after each vaccine. Hold Methotrexate for 2 weeks after the single dose COVID-19 vaccine.   If you are on Orencia subcutaneous injection - hold medication one week prior to and one week after the first COVID-19 vaccine dose (only).   If you are on Orencia IV infusions- time vaccination administration so that the first COVID-19 vaccination  will occur four weeks after the infusion and postpone the subsequent infusion by one week.   If you are on Cyclophosphamide or Rituxan infusions please contact your doctor prior to receiving the COVID-19 vaccine.   Do not take Tylenol or ant anti-inflammatory medications (NSAIDs) 24 hours prior to the COVID-19 vaccination.   There is no direct evidence about the efficacy of the COVID-19 vaccine in individuals who are on medications that suppress the immune system.   Even if you are fully vaccinated, and you are on any medications that suppress your immune system, please continue to wear a mask, maintain at least six feet social distance and practice hand hygiene.   If you develop a COVID-19 infection, please contact your PCP or our office to determine if you need antibody infusion.  We anticipate that a booster vaccine will be available soon for immunosuppressed individuals. Please cal our office before receiving your booster dose to make adjustments to your medication regimen.  Shoulder Exercises Ask your health care provider which exercises are safe for you. Do exercises exactly as told by your health care provider and adjust them as directed. It is normal to feel mild stretching, pulling, tightness, or discomfort as you do these exercises. Stop right away if you feel sudden pain or your pain gets worse. Do not begin these exercises until told by your health care provider. Stretching exercises External rotation and abduction This exercise is sometimes called corner stretch. This exercise rotates your arm outward (external rotation) and moves your arm out from  your body (abduction). 1. Stand in a doorway with one of your feet slightly in front of the other. This is called a staggered stance. If you cannot reach your forearms to the door frame, stand facing a corner of a room. 2. Choose one of the following positions as told by your health care provider: ? Place your hands and forearms on the  door frame above your head. ? Place your hands and forearms on the door frame at the height of your head. ? Place your hands on the door frame at the height of your elbows. 3. Slowly move your weight onto your front foot until you feel a stretch across your chest and in the front of your shoulders. Keep your head and chest upright and keep your abdominal muscles tight. 4. Hold for __________ seconds. 5. To release the stretch, shift your weight to your back foot. Repeat __________ times. Complete this exercise __________ times a day. Extension, standing 1. Stand and hold a broomstick, a cane, or a similar object behind your back. ? Your hands should be a little wider than shoulder width apart. ? Your palms should face away from your back. 2. Keeping your elbows straight and your shoulder muscles relaxed, move the stick away from your body until you feel a stretch in your shoulders (extension). ? Avoid shrugging your shoulders while you move the stick. Keep your shoulder blades tucked down toward the middle of your back. 3. Hold for __________ seconds. 4. Slowly return to the starting position. Repeat __________ times. Complete this exercise __________ times a day. Range-of-motion exercises Pendulum  1. Stand near a wall or a surface that you can hold onto for balance. 2. Bend at the waist and let your left / right arm hang straight down. Use your other arm to support you. Keep your back straight and do not lock your knees. 3. Relax your left / right arm and shoulder muscles, and move your hips and your trunk so your left / right arm swings freely. Your arm should swing because of the motion of your body, not because you are using your arm or shoulder muscles. 4. Keep moving your hips and trunk so your arm swings in the following directions, as told by your health care provider: ? Side to side. ? Forward and backward. ? In clockwise and counterclockwise circles. 5. Continue each motion for  __________ seconds, or for as long as told by your health care provider. 6. Slowly return to the starting position. Repeat __________ times. Complete this exercise __________ times a day. Shoulder flexion, standing  1. Stand and hold a broomstick, a cane, or a similar object. Place your hands a little more than shoulder width apart on the object. Your left / right hand should be palm up, and your other hand should be palm down. 2. Keep your elbow straight and your shoulder muscles relaxed. Push the stick up with your healthy arm to raise your left / right arm in front of your body, and then over your head until you feel a stretch in your shoulder (flexion). ? Avoid shrugging your shoulder while you raise your arm. Keep your shoulder blade tucked down toward the middle of your back. 3. Hold for __________ seconds. 4. Slowly return to the starting position. Repeat __________ times. Complete this exercise __________ times a day. Shoulder abduction, standing 1. Stand and hold a broomstick, a cane, or a similar object. Place your hands a little more than shoulder width apart on  the object. Your left / right hand should be palm up, and your other hand should be palm down. 2. Keep your elbow straight and your shoulder muscles relaxed. Push the object across your body toward your left / right side. Raise your left / right arm to the side of your body (abduction) until you feel a stretch in your shoulder. ? Do not raise your arm above shoulder height unless your health care provider tells you to do that. ? If directed, raise your arm over your head. ? Avoid shrugging your shoulder while you raise your arm. Keep your shoulder blade tucked down toward the middle of your back. 3. Hold for __________ seconds. 4. Slowly return to the starting position. Repeat __________ times. Complete this exercise __________ times a day. Internal rotation  1. Place your left / right hand behind your back, palm up. 2. Use  your other hand to dangle an exercise band, a towel, or a similar object over your shoulder. Grasp the band with your left / right hand so you are holding on to both ends. 3. Gently pull up on the band until you feel a stretch in the front of your left / right shoulder. The movement of your arm toward the center of your body is called internal rotation. ? Avoid shrugging your shoulder while you raise your arm. Keep your shoulder blade tucked down toward the middle of your back. 4. Hold for __________ seconds. 5. Release the stretch by letting go of the band and lowering your hands. Repeat __________ times. Complete this exercise __________ times a day. Strengthening exercises External rotation  1. Sit in a stable chair without armrests. 2. Secure an exercise band to a stable object at elbow height on your left / right side. 3. Place a soft object, such as a folded towel or a small pillow, between your left / right upper arm and your body to move your elbow about 4 inches (10 cm) away from your side. 4. Hold the end of the exercise band so it is tight and there is no slack. 5. Keeping your elbow pressed against the soft object, slowly move your forearm out, away from your abdomen (external rotation). Keep your body steady so only your forearm moves. 6. Hold for __________ seconds. 7. Slowly return to the starting position. Repeat __________ times. Complete this exercise __________ times a day. Shoulder abduction  1. Sit in a stable chair without armrests, or stand up. 2. Hold a __________ weight in your left / right hand, or hold an exercise band with both hands. 3. Start with your arms straight down and your left / right palm facing in, toward your body. 4. Slowly lift your left / right hand out to your side (abduction). Do not lift your hand above shoulder height unless your health care provider tells you that this is safe. ? Keep your arms straight. ? Avoid shrugging your shoulder while you  do this movement. Keep your shoulder blade tucked down toward the middle of your back. 5. Hold for __________ seconds. 6. Slowly lower your arm, and return to the starting position. Repeat __________ times. Complete this exercise __________ times a day. Shoulder extension 1. Sit in a stable chair without armrests, or stand up. 2. Secure an exercise band to a stable object in front of you so it is at shoulder height. 3. Hold one end of the exercise band in each hand. Your palms should face each other. 4. Straighten your elbows and  lift your hands up to shoulder height. 5. Step back, away from the secured end of the exercise band, until the band is tight and there is no slack. 6. Squeeze your shoulder blades together as you pull your hands down to the sides of your thighs (extension). Stop when your hands are straight down by your sides. Do not let your hands go behind your body. 7. Hold for __________ seconds. 8. Slowly return to the starting position. Repeat __________ times. Complete this exercise __________ times a day. Shoulder row 1. Sit in a stable chair without armrests, or stand up. 2. Secure an exercise band to a stable object in front of you so it is at waist height. 3. Hold one end of the exercise band in each hand. Position your palms so that your thumbs are facing the ceiling (neutral position). 4. Bend each of your elbows to a 90-degree angle (right angle) and keep your upper arms at your sides. 5. Step back until the band is tight and there is no slack. 6. Slowly pull your elbows back behind you. 7. Hold for __________ seconds. 8. Slowly return to the starting position. Repeat __________ times. Complete this exercise __________ times a day. Shoulder press-ups  1. Sit in a stable chair that has armrests. Sit upright, with your feet flat on the floor. 2. Put your hands on the armrests so your elbows are bent and your fingers are pointing forward. Your hands should be about even  with the sides of your body. 3. Push down on the armrests and use your arms to lift yourself off the chair. Straighten your elbows and lift yourself up as much as you comfortably can. ? Move your shoulder blades down, and avoid letting your shoulders move up toward your ears. ? Keep your feet on the ground. As you get stronger, your feet should support less of your body weight as you lift yourself up. 4. Hold for __________ seconds. 5. Slowly lower yourself back into the chair. Repeat __________ times. Complete this exercise __________ times a day. Wall push-ups  1. Stand so you are facing a stable wall. Your feet should be about one arm-length away from the wall. 2. Lean forward and place your palms on the wall at shoulder height. 3. Keep your feet flat on the floor as you bend your elbows and lean forward toward the wall. 4. Hold for __________ seconds. 5. Straighten your elbows to push yourself back to the starting position. Repeat __________ times. Complete this exercise __________ times a day. This information is not intended to replace advice given to you by your health care provider. Make sure you discuss any questions you have with your health care provider. Document Revised: 10/13/2018 Document Reviewed: 07/21/2018 Elsevier Patient Education  [place].

## 2020-03-06 ENCOUNTER — Telehealth: Payer: Self-pay | Admitting: Rheumatology

## 2020-03-06 NOTE — Progress Notes (Signed)
Office Visit Note  Patient: Annette Suarez             Date of Birth: Sep 26, 1940           MRN: [id number]             PCP: Shirline Frees, MD Referring: Shirline Frees, MD Visit Date: 03/07/2020 Occupation: @GUAROCC @  Subjective:  Right leg pain   History of Present Illness: Annette Suarez is a 79 y.o. female with history of seronegative rheumatoid arthritis and osteoarthritis. She is on Enbrel 50 mg sq injections once weekly.  She denies missing any doses of Enbrel recently.  She denies any recent rheumatoid arthritis flares.  She presents today with right knee joint pain which started 3 days ago and is progressively worsening.  According to the patient she was stepping up on a stair at home and slightly twisted her right knee and has had discomfort since then. She states the pain started to radiate on the lateral aspect of the right hip and down to her right ankle.  She denies any joint swelling.  She has been using a cane to assist with ambulation.  She is having severe pain when trying to walk.  She has started to take Tylenol which has been subsiding the discomfort.  She denies any lower back pain at this time.  She denies any calf pain.  She states that she does have a history of rupturing a Baker's cyst behind the right knee but denies similar symptoms at this time.    Activities of Daily Living:  Patient reports morning stiffness for a few minutes.   Patient Reports nocturnal pain.  Difficulty dressing/grooming: Reports Difficulty climbing stairs: Reports Difficulty getting out of chair: Reports Difficulty using hands for taps, buttons, cutlery, and/or writing: Denies  Review of Systems  Constitutional: Positive for fatigue.  HENT: Negative for mouth sores, mouth dryness and nose dryness.   Eyes: Negative for pain, visual disturbance and dryness.  Respiratory: Negative for cough, hemoptysis, shortness of breath and difficulty breathing.   Cardiovascular: Negative for chest pain,  palpitations, hypertension and swelling in legs/feet.  Gastrointestinal: Negative for blood in stool, constipation and diarrhea.  Endocrine: Negative for increased urination.  Genitourinary: Negative for painful urination.  Musculoskeletal: Positive for arthralgias and joint pain. Negative for joint swelling, myalgias, muscle weakness, morning stiffness, muscle tenderness and myalgias.  Skin: Negative for color change, pallor, rash, hair loss, nodules/bumps, skin tightness, ulcers and sensitivity to sunlight.  Allergic/Immunologic: Negative for susceptible to infections.  Neurological: Negative for dizziness, numbness, headaches and weakness.  Hematological: Negative for swollen glands.  Psychiatric/Behavioral: Positive for sleep disturbance. Negative for depressed mood. The patient is not nervous/anxious.     PMFS History:  Patient Active Problem List   Diagnosis Date Noted  . Primary osteoarthritis of both feet 12/13/2017  . Dehydration 09/09/2017  . Acute lower UTI 09/09/2017  . Nausea & vomiting 09/08/2017  . Primary osteoarthritis of both hands 04/05/2017  . Left carpal tunnel syndrome 11/15/2016  . Rheumatoid arthritis of multiple sites with negative rheumatoid factor ([place]) 10/06/2016  . High risk medication use 10/06/2016  . Dyslipidemia 10/06/2016  . Pain in both hands 09/08/2016  . Essential hypertension 09/08/2016  . Type 2 diabetes mellitus  09/08/2016  . Gastroesophageal reflux disease without esophagitis 09/08/2016  . Osteopenia of multiple sites 09/08/2016  . History of anxiety 09/08/2016  . Benign paroxysmal positional vertigo 09/08/2016  . Smoker 09/08/2016  . Sleep apnea 09/08/2016  Past Medical History:  Diagnosis Date  . Acute cystitis   . Anxiety   . Arthritis   . Diabetes mellitus without complication ([place])   . GERD (gastroesophageal reflux disease)   . Goiter, nontoxic, multinodular   . Hypertension   . Incomplete bladder emptying   . Postmenopausal  atrophic vaginitis   . RA (rheumatoid arthritis) ([place])   . Rectocele   . Renal calculus   . Urolithiasis   . Vaginal enterocele     Family History  Problem Relation Age of Onset  . Hypertension Other   . Diabetes Mother   . Heart disease Father   . Diabetes Brother   . Cancer Sister        breast   . Bladder Cancer Brother   . Diabetes Son   . Crohn's disease Son    Past Surgical History:  Procedure Laterality Date  . CHOLECYSTECTOMY    . SQUAMOUS CELL CARCINOMA EXCISION  05/2018   Social History   Social History Narrative  . Not on file   Immunization History  Administered Date(s) Administered  . PFIZER SARS-COV-2 Vaccination 08/06/2019, 08/27/2019     Objective: Vital Signs: BP (!) 198/68 (BP Location: Right Arm, Patient Position: Sitting, Cuff Size: Normal)   Pulse 85   Resp 15   Ht 5\' 2"  (1.575 m)   Wt 164 lb (74.4 kg)   BMI 30.00 kg/m    Physical Exam Vitals and nursing note reviewed.  Constitutional:      Appearance: She is well-developed.  HENT:     Head: Normocephalic and atraumatic.  Eyes:     Conjunctiva/sclera: Conjunctivae normal.  Pulmonary:     Effort: Pulmonary effort is normal.  Abdominal:     Palpations: Abdomen is soft.  Musculoskeletal:     Cervical back: Normal range of motion.  Skin:    General: Skin is warm and dry.     Capillary Refill: Capillary refill takes less than 2 seconds.  Neurological:     Mental Status: She is alert and oriented to person, place, and time.  Psychiatric:        Behavior: Behavior normal.      Musculoskeletal Exam:  C-spine, thoracic spine, and lumbar spine good ROM.  Shoulder joints, elbow joints, wrist joints, MCPs, PIPs, and DIPs good ROM with no synovitis.  Complete fist formation bilaterally.  She has PIP and DIP thickening consistent with osteoarthritis of both hands.  She is able to make a complete fist bilaterally.  She has good hip range of motion bilaterally.  Some discomfort in the right hip.   She has tenderness palpation over the right trochanteric bursa and right IT band.  Right knee has painful range of motion but no effusion was noted.  Left knee joint has good range of motion with no warmth or effusion. No baker's cyst palpable.  Ankle joints have good range of motion no tenderness or inflammation.  No calf tenderness noted.  CDAI Exam: CDAI Score: -- Patient Global: --; Provider Global: -- Swollen: --; Tender: -- Joint Exam 03/07/2020   No joint exam has been documented for this visit   There is currently no information documented on the homunculus. Go to the Rheumatology activity and complete the homunculus joint exam.  Investigation: No additional findings.  Imaging: XR Foot 2 Views Left  Result Date: 02/08/2020 Juxta-articular osteopenia was noted.  First MTP severe narrowing and hallux valgus deformity was noted.  Second and third MTP joint narrowing was noted.  PIP and DIP narrowing was noted.  No intertarsal, tibiotalar or subtalar joint space narrowing was noted.  Inferior posterior calcaneal spurs were noted.  No erosive changes were noted. Impression: These findings are consistent with rheumatoid arthritis and osteoarthritis overlap.  XR Foot 2 Views Right  Result Date: 02/08/2020 Juxta-articular osteopenia was noted.  First, second and third MTP joint narrowing was noted.  Hallux valgus deformity was noted.  PIP and DIP narrowing was noted.  Dorsal spurring was noted.  No tibiotalar or subtalar joint space narrowing was noted.  Inferior and posterior calcaneal spurs were noted.  No erosive changes were noted. Impression: These findings are consistent with rheumatoid arthritis and osteoarthritis overlap.  XR Hand 2 View Left  Result Date: 02/08/2020 Juxta-articular osteopenia was noted.  Narrowing of all MCP joints with most severe narrowing of the first second and third MCP joints was noted.  Narrowing of all PIP and DIP joints was noted.  No intercarpal or  radiocarpal joint space narrowing was noted.  No erosive changes were noted. Impression: These findings are consistent with rheumatoid arthritis and osteoarthritis overlap.  XR Hand 2 View Right  Result Date: 02/08/2020 Juxta-articular osteopenia was noted.  Narrowing of all MCP joints with most severe narrowing of the first second and third MCP joints was noted.  Narrowing of all PIP and DIP joints was noted.  No intercarpal or radiocarpal joint space narrowing was noted.  No erosive changes were noted. Impression: These findings are consistent with rheumatoid arthritis and osteoarthritis overlap.  XR Shoulder Left  Result Date: 02/08/2020 No glenohumeral joint space narrowing was noted.  No acromioclavicular joint space narrowing was noted.  Some spurring of the acromion was noted. Impression: Osteoarthritic changes of the shoulder joint was noted.  XR Shoulder Right  Result Date: 02/08/2020 No glenohumeral joint space narrowing was noted.  No acromioclavicular joint space narrowing was noted.  Some spurring of the acromion was noted. Impression: Osteoarthritic changes of the shoulder joint was noted.   Recent Labs: Lab Results  Component Value Date   WBC 9.0 12/31/2019   HGB 12.9 12/31/2019   PLT 292 12/31/2019   NA 138 12/31/2019   K 4.6 12/31/2019   CL 101 12/31/2019   CO2 28 12/31/2019   GLUCOSE 197 (H) 12/31/2019   BUN 17 12/31/2019   CREATININE 1.14 (H) 12/31/2019   BILITOT 1.1 12/31/2019   ALKPHOS 56 09/09/2017   AST 11 12/31/2019   ALT 13 12/31/2019   PROT 6.7 12/31/2019   ALBUMIN 3.2 (L) 09/09/2017   CALCIUM 9.4 12/31/2019   GFRAA 54 (L) 12/31/2019   QFTBGOLDPLUS NEGATIVE 06/13/2018    Speciality Comments: Recieves Enbrel through [facility] 02/20/2019 Osteopenia managed by Dr. Annice Pih 02/20/2019  Procedures:  No procedures performed Allergies: Latex   Assessment / Plan:     Visit Diagnoses: Rheumatoid arthritis of multiple sites with  negative rheumatoid factor ([place]) - She has no joint tenderness or synovitis on exam.  She has not had any recent rheumatoid arthritis flares.  She is clinically doing well on Enbrel 50 mg subcutaneous injections once weekly.  She had updated x-rays of both hands and both feet at her last visit on 02/08/2020 which were consistent with osteoarthritis and rheumatoid arthritis overlap.  No erosive changes were noted.  She presents today with acute pain in the right knee joint which started about 3 days ago after twisting her knee when going up a step at home.  She has no warmth or  effusion on examination today.  X-rays of the right knee were obtained which were consistent with osteoarthritis and chondrocalcinosis.  X-ray results were discussed with the patient today in the office.  She was not a good candidate for cortisone injection due to her blood pressure being elevated at 198/68 in the office.  She was sent directly to her PCPs office for further evaluation and management.  She will also be evaluated at Ortho care today due to the severity of her right knee joint pain.  Her MRI of the right knee was recommended but she has claustrophobia and anxiety and was hesitant to schedule that today.  She is not experiencing any other joint pain or inflammation.  She will continue on Enbrel 50 mg subcutaneous injections once weekly.  She will follow-up for routine visit in 5 months.   High risk medication use - Enbrel 50 mg sq injections once weekly. (inadequate response to [place]).  TB Gold negative 07/25/19.  CBC and CMP were drawn on 12/31/2019.  She will be due to update lab work at the end of September and every 3 months to monitor for drug toxicity.  Standing orders for CBC and CMP are in place. She has received both COVID-19 vaccinations and is planning on receiving the third dose.  She was advised to hold NSAIDs and Tylenol 24 hours prior to receiving the vaccine.  She was encouraged to continue to wear a mask and  social distance.  She was advised to notify us or her PCP if she develops the COVID-19 infection in order to receive the antibody infusion.  She voiced understanding.  Acute pain of right knee -she presents today with acute right knee joint pain which started about 3 days ago after twisting her right knee when going up a step at home.  She did not have any other injuries or falls.  She has noticed intermittent mechanical symptoms and has been having difficulty ambulating due to the severity of pain.  She was using a wheelchair today while in the office.  She has painful range of motion of the right knee joint on exam but no warmth or effusion was noted.  X-rays of the right knee joint were obtained today.  An MRI of the right knee joint was recommended but she was hesitant to schedule that at this time due to her history of anxiety and claustrophobia.  She will be evaluated Ortho care today for further recommendations.  She was not a good candidate for cortisone injection while in the office due to her blood pressure being 198/68.  She was sent to her PCP urgently for further recommendations.  She cannot take NSAIDs due to elevated creatinine and low GFR.  She has been taking Tylenol as needed for pain relief.  Plan: XR KNEE 3 VIEW RIGHT  Trochanteric bursitis, right hip: She has tenderness palpation over the right trochanteric bursa.  Chronic pain of both shoulders - XRs are unremarkable except for some acromial spurring.  She has good range of motion of both shoulder joints on examination today.  She has no discomfort at this time.  She was given a handout of shoulder joint exercises at her last office visit on 02/08/2020.  Primary osteoarthritis of both hands: She has PIP and DIP thickening consistent with osteoarthritis of both hands.  No tenderness or inflammation was noted.  She is able to make a complete fist bilaterally.  Joint protection and muscle strengthening were discussed.  Paresthesia of both  hands: Resolved  Primary osteoarthritis of both feet: She is not having any discomfort in her feet at this time.  She has good range of motion of both ankle joints with no tenderness or warmth.  Osteopenia of multiple sites - Managed by Dr. Warren Lacy 02/20/19. She is on a daily calcium/vitamin D supplement.   Essential hypertension: Her blood pressure was 198/68 today in the office.  We called her PCPs office and she was set up for an urgent appointment at 1230 pm and she was driven by her husband to that appointment.    Other medical conditions are listed as follows:   Gastroesophageal reflux disease without esophagitis  History of vertigo  History of sleep apnea  Type 2 diabetes mellitus   History of hyperlipidemia  History of anxiety  Former smoker    Orders: Orders Placed This Encounter  Procedures  . XR KNEE 3 VIEW RIGHT   No orders of the defined types were placed in this encounter.    Follow-Up Instructions: Return in about 5 months (around 08/07/2020) for Rheumatoid arthritis, Osteoarthritis.   Ofilia Neas, PA-C  Note - This record has been created using Dragon software.  Chart creation errors have been sought, but may not always  have been located. Such creation errors do not reflect on  the standard of medical care.

## 2020-03-06 NOTE — Telephone Encounter (Signed)
OPENED IN ERROR

## 2020-03-07 ENCOUNTER — Encounter: Payer: Self-pay | Admitting: Physician Assistant

## 2020-03-07 ENCOUNTER — Ambulatory Visit: Payer: Self-pay

## 2020-03-07 ENCOUNTER — Other Ambulatory Visit: Payer: Self-pay

## 2020-03-07 ENCOUNTER — Ambulatory Visit: Payer: Medicare Other | Admitting: Physician Assistant

## 2020-03-07 VITALS — BP 198/68 | HR 85 | Resp 15 | Ht 62.0 in | Wt 164.0 lb

## 2020-03-07 VITALS — Ht 62.0 in | Wt 164.0 lb

## 2020-03-07 DIAGNOSIS — E118 Type 2 diabetes mellitus with unspecified complications: Secondary | ICD-10-CM

## 2020-03-07 DIAGNOSIS — M8589 Other specified disorders of bone density and structure, multiple sites: Secondary | ICD-10-CM

## 2020-03-07 DIAGNOSIS — Z794 Long term (current) use of insulin: Secondary | ICD-10-CM

## 2020-03-07 DIAGNOSIS — Z87898 Personal history of other specified conditions: Secondary | ICD-10-CM

## 2020-03-07 DIAGNOSIS — Z87891 Personal history of nicotine dependence: Secondary | ICD-10-CM

## 2020-03-07 DIAGNOSIS — M25561 Pain in right knee: Secondary | ICD-10-CM

## 2020-03-07 DIAGNOSIS — Z8659 Personal history of other mental and behavioral disorders: Secondary | ICD-10-CM

## 2020-03-07 DIAGNOSIS — M0609 Rheumatoid arthritis without rheumatoid factor, multiple sites: Secondary | ICD-10-CM | POA: Diagnosis not present

## 2020-03-07 DIAGNOSIS — Z79899 Other long term (current) drug therapy: Secondary | ICD-10-CM | POA: Diagnosis not present

## 2020-03-07 DIAGNOSIS — M7061 Trochanteric bursitis, right hip: Secondary | ICD-10-CM

## 2020-03-07 DIAGNOSIS — Z8639 Personal history of other endocrine, nutritional and metabolic disease: Secondary | ICD-10-CM

## 2020-03-07 DIAGNOSIS — M19072 Primary osteoarthritis, left ankle and foot: Secondary | ICD-10-CM

## 2020-03-07 DIAGNOSIS — M19041 Primary osteoarthritis, right hand: Secondary | ICD-10-CM

## 2020-03-07 DIAGNOSIS — M25511 Pain in right shoulder: Secondary | ICD-10-CM

## 2020-03-07 DIAGNOSIS — M19071 Primary osteoarthritis, right ankle and foot: Secondary | ICD-10-CM

## 2020-03-07 DIAGNOSIS — G8929 Other chronic pain: Secondary | ICD-10-CM

## 2020-03-07 DIAGNOSIS — M25512 Pain in left shoulder: Secondary | ICD-10-CM

## 2020-03-07 DIAGNOSIS — I10 Essential (primary) hypertension: Secondary | ICD-10-CM

## 2020-03-07 DIAGNOSIS — R202 Paresthesia of skin: Secondary | ICD-10-CM

## 2020-03-07 DIAGNOSIS — Z8669 Personal history of other diseases of the nervous system and sense organs: Secondary | ICD-10-CM

## 2020-03-07 DIAGNOSIS — M19042 Primary osteoarthritis, left hand: Secondary | ICD-10-CM

## 2020-03-07 DIAGNOSIS — K219 Gastro-esophageal reflux disease without esophagitis: Secondary | ICD-10-CM

## 2020-03-07 MED ORDER — LIDOCAINE HCL 1 % IJ SOLN
5.0000 mL | INTRAMUSCULAR | Status: AC | PRN
  Administered 2020-03-07: 5 mL

## 2020-03-07 MED ORDER — METHYLPREDNISOLONE ACETATE 40 MG/ML IJ SUSP
40.0000 mg | INTRAMUSCULAR | Status: AC | PRN
  Administered 2020-03-07: 40 mg via INTRA_ARTICULAR

## 2020-03-07 NOTE — Patient Instructions (Signed)
Standing Labs We placed an order today for your standing lab work.   Please have your standing labs drawn in September and every 3 months   If possible, please have your labs drawn 2 weeks prior to your appointment so that the provider can discuss your results at your appointment.  We have open lab daily Monday through Thursday from 8:30-12:30 PM and 1:30-4:30 PM and Friday from 8:30-12:30 PM and 1:30-4:00 PM at the office of Dr. Shaili Deveshwar, [facility].   Please be advised, patients with office appointments requiring lab work will take precedents over walk-in lab work.  If possible, please come for your lab work on Monday and Friday afternoons, as you may experience shorter wait times. The office is located at [address], [address], [place], [place] [address] No appointment is necessary.   Labs are drawn by [facility]. Please bring your co-pay at the time of your lab draw.  You may receive a bill from [facility] for your lab work.  If you wish to have your labs drawn at another location, please call the office 24 hours in advance to send orders.  If you have any questions regarding directions or hours of operation,  please call [phone].   As a reminder, please drink plenty of water prior to coming for your lab work. Thanks!  COVID-19 vaccine recommendations:   COVID-19 vaccine is recommended for everyone (unless you are allergic to a vaccine component), even if you are on a medication that suppresses your immune system.   If you are on Methotrexate, Cellcept (mycophenolate), Rinvoq, Xeljanz, and Olumiant- hold the medication for 1 week after each vaccine. Hold Methotrexate for 2 weeks after the single dose COVID-19 vaccine.   If you are on Orencia subcutaneous injection - hold medication one week prior to and one week after the first COVID-19 vaccine dose (only).   If you are on Orencia IV infusions- time vaccination administration so that the first COVID-19  vaccination will occur four weeks after the infusion and postpone the subsequent infusion by one week.   If you are on Cyclophosphamide or Rituxan infusions please contact your doctor prior to receiving the COVID-19 vaccine.   Do not take Tylenol or any anti-inflammatory medications (NSAIDs) 24 hours prior to the COVID-19 vaccination.   There is no direct evidence about the efficacy of the COVID-19 vaccine in individuals who are on medications that suppress the immune system.   Even if you are fully vaccinated, and you are on any medications that suppress your immune system, please continue to wear a mask, maintain at least six feet social distance and practice hand hygiene.   If you develop a COVID-19 infection, please contact your PCP or our office to determine if you need antibody infusion.  The booster vaccine is now available for immunocompromised patients. It is advised that if you had Pfizer vaccine you should get Pfizer booster.  If you had a Moderna vaccine then you should get a Moderna booster. Johnson and Johnson does not have a booster vaccine at this time.  Please see the following web sites for updated information.   [email or web address]://[email or web address]/[email or web address]/[email or web address]-19-Vaccination-Patient-Resources.[email or web address]  https://[email or web address]/About-Us/Newsroom/Press-Releases/ID/1159   

## 2020-03-07 NOTE — Progress Notes (Signed)
Office Visit Note   Patient: Annette Suarez           Date of Birth: [birth year]           MRN: [id number] Visit Date: 03/07/2020              Requested by: Shirline Frees, MD [place],  [place] [address] PCP: Shirline Frees, MD  Chief Complaint  Patient presents with  . Right Knee - Pain    DOI 03/04/2020 twist injury       HPI: This is a pleasant 79 year old woman with a 3-day history of right knee pain.  She has a history of rheumatoid arthritis.  She denies any popping or giving way.  She just states she twisted the knee a little bit and felt something pull.  Since then she has had progressive difficulty bearing weight with regards to the medial side of her knee.  The pain runs up and down from her knee.  She denies any swelling or ecchymosis.  She denies any calf pain.  She states she does have a history of a Baker's cyst in the past  Assessment & Plan: Visit Diagnoses: No diagnosis found.  Plan: Findings consistent with most likely a degenerative meniscus tear.  She may have also a MCL sprain.  I discussed with her giving her an injection today.  Also gave her a prescription for a hinged knee brace.  She is already had a prescription for tramadol.  She will follow-up in 1 week for reexamination  Follow-Up Instructions: No follow-ups on file.   Ortho Exam  Patient is alert, oriented, no adenopathy, well-dressed, normal affect, normal respiratory effort. Right knee: No effusion no cellulitis only mild soft tissue swelling but not different from the other side.  She has global tenderness but is mostly tender over the medial joint line in the insertion proximally and distally of the MCL.  Could not open up the joint with stress on valgus.  Her compartments are soft and nontender.  Imaging: No results found. No images are attached to the encounter.  Labs: Lab Results  Component Value Date   ESRSEDRATE 32 (H) 09/08/2016   LABURIC 5.7 09/12/2017      Lab Results  Component Value Date   ALBUMIN 3.2 (L) 09/09/2017   ALBUMIN 3.9 09/08/2017   ALBUMIN 4.5 01/06/2017   LABURIC 5.7 09/12/2017    Lab Results  Component Value Date   MG 1.2 (L) 09/09/2017   No results found for: VD25OH  No results found for: PREALBUMIN CBC EXTENDED Latest Ref Rng & Units 12/31/2019 09/07/2019 04/12/2019  WBC 3.8 - 10.8 Thousand/uL 9.0 9.9 8.7  RBC 3.80 - 5.10 Million/uL 4.12 4.13 4.20  HGB 11.7 - 15.5 g/dL 12.9 12.9 12.8  HCT 35 - 45 % 37.7 37.6 38.4  PLT 140 - 400 Thousand/uL 292 287 331  NEUTROABS 1,500 - 7,800 cells/uL 5,652 6,425 5,838  LYMPHSABS 850 - 3,900 cells/uL 2,376 2,396 1,757     Body mass index is 30 kg/m.  Orders:  No orders of the defined types were placed in this encounter.  No orders of the defined types were placed in this encounter.    Procedures: Large Joint Inj: R knee on 03/07/2020 2:53 PM Indications: pain and diagnostic evaluation Details: 22 G 1.5 in needle, anterolateral approach  Arthrogram: No  Medications: 40 mg methylPREDNISolone acetate 40 MG/ML; 5 mL lidocaine 1 % Outcome: tolerated well, no immediate complications Procedure, treatment  alternatives, risks and benefits explained, specific risks discussed. Consent was given by the patient.      Clinical Data: No additional findings.  ROS:  All other systems negative, except as noted in the HPI. Review of Systems  Objective: Vital Signs: Ht 5\' 2"  (1.575 m)   Wt 164 lb (74.4 kg)   BMI 30.00 kg/m   Specialty Comments:  No specialty comments available.  PMFS History: Patient Active Problem List   Diagnosis Date Noted  . Primary osteoarthritis of both feet 12/13/2017  . Dehydration 09/09/2017  . Acute lower UTI 09/09/2017  . Nausea & vomiting 09/08/2017  . Primary osteoarthritis of both hands 04/05/2017  . Left carpal tunnel syndrome 11/15/2016  . Rheumatoid arthritis of multiple sites with negative rheumatoid factor ([place]) 10/06/2016  .  High risk medication use 10/06/2016  . Dyslipidemia 10/06/2016  . Pain in both hands 09/08/2016  . Essential hypertension 09/08/2016  . Type 2 diabetes mellitus  09/08/2016  . Gastroesophageal reflux disease without esophagitis 09/08/2016  . Osteopenia of multiple sites 09/08/2016  . History of anxiety 09/08/2016  . Benign paroxysmal positional vertigo 09/08/2016  . Smoker 09/08/2016  . Sleep apnea 09/08/2016   Past Medical History:  Diagnosis Date  . Acute cystitis   . Anxiety   . Arthritis   . Diabetes mellitus without complication ([place])   . GERD (gastroesophageal reflux disease)   . Goiter, nontoxic, multinodular   . Hypertension   . Incomplete bladder emptying   . Postmenopausal atrophic vaginitis   . RA (rheumatoid arthritis) ([place])   . Rectocele   . Renal calculus   . Urolithiasis   . Vaginal enterocele     Family History  Problem Relation Age of Onset  . Hypertension Other   . Diabetes Mother   . Heart disease Father   . Diabetes Brother   . Cancer Sister        breast   . Bladder Cancer Brother   . Diabetes Son   . Crohn's disease Son     Past Surgical History:  Procedure Laterality Date  . CHOLECYSTECTOMY    . SQUAMOUS CELL CARCINOMA EXCISION  05/2018   Social History   Occupational History  . Not on file  Tobacco Use  . Smoking status: Former Smoker    Packs/day: 0.20    Years: 10.00    Pack years: 2.00    Types: Cigarettes    Quit date: 07/05/1978    Years since quitting: 41.7  . Smokeless tobacco: Never Used  Vaping Use  . Vaping Use: Never used  Substance and Sexual Activity  . Alcohol use: No  . Drug use: No  . Sexual activity: Not on file

## 2020-03-14 ENCOUNTER — Ambulatory Visit: Payer: Medicare Other | Admitting: Family

## 2020-03-14 ENCOUNTER — Other Ambulatory Visit: Payer: Self-pay

## 2020-03-14 DIAGNOSIS — M25561 Pain in right knee: Secondary | ICD-10-CM | POA: Diagnosis not present

## 2020-03-14 DIAGNOSIS — Z79899 Other long term (current) drug therapy: Secondary | ICD-10-CM

## 2020-03-15 LAB — COMPLETE METABOLIC PANEL WITH GFR
AG Ratio: 1.8 (calc) (ref 1.0–2.5)
ALT: 9 U/L (ref 6–29)
AST: 9 U/L — ABNORMAL LOW (ref 10–35)
Albumin: 4.3 g/dL (ref 3.6–5.1)
Alkaline phosphatase (APISO): 87 U/L (ref 37–153)
BUN/Creatinine Ratio: 21 (calc) (ref 6–22)
BUN: 24 mg/dL (ref 7–25)
CO2: 30 mmol/L (ref 20–32)
Calcium: 9.1 mg/dL (ref 8.6–10.4)
Chloride: 100 mmol/L (ref 98–110)
Creat: 1.12 mg/dL — ABNORMAL HIGH (ref 0.60–0.93)
GFR, Est African American: 55 mL/min/{1.73_m2} — ABNORMAL LOW (ref >=60)
GFR, Est Non African American: 47 mL/min/{1.73_m2} — ABNORMAL LOW (ref >=60)
Globulin: 2.4 g/dL (calc) (ref 1.9–3.7)
Glucose, Bld: 178 mg/dL — ABNORMAL HIGH (ref 65–99)
Potassium: 4.7 mmol/L (ref 3.5–5.3)
Sodium: 140 mmol/L (ref 135–146)
Total Bilirubin: 1 mg/dL (ref 0.2–1.2)
Total Protein: 6.7 g/dL (ref 6.1–8.1)

## 2020-03-15 LAB — CBC WITH DIFFERENTIAL/PLATELET
Absolute Monocytes: 655 cells/uL (ref 200–950)
Basophils Absolute: 79 cells/uL (ref 0–200)
Basophils Relative: 0.6 %
Eosinophils Absolute: 236 cells/uL (ref 15–500)
Eosinophils Relative: 1.8 %
HCT: 40.1 % (ref 35.0–45.0)
Hemoglobin: 13.5 g/dL (ref 11.7–15.5)
Lymphs Abs: 2764 cells/uL (ref 850–3900)
MCH: 31.2 pg (ref 27.0–33.0)
MCHC: 33.7 g/dL (ref 32.0–36.0)
MCV: 92.6 fL (ref 80.0–100.0)
MPV: 10.4 fL (ref 7.5–12.5)
Monocytes Relative: 5 %
Neutro Abs: 9367 cells/uL — ABNORMAL HIGH (ref 1500–7800)
Neutrophils Relative %: 71.5 %
Platelets: 357 10*3/uL (ref 140–400)
RBC: 4.33 10*6/uL (ref 3.80–5.10)
RDW: 12.6 % (ref 11.0–15.0)
Total Lymphocyte: 21.1 %
WBC: 13.1 10*3/uL — ABNORMAL HIGH (ref 3.8–10.8)

## 2020-03-17 NOTE — Progress Notes (Signed)
Glucose is elevated-178.  Creatinine is elevated-1.12. GFR is low-47 but stable.  Please advise the patient to avoid taking NSAIDs.  Please forward to PCP.   AST borderline low. ALT WNL.    CBC elevated-13.1-absolute neutrophil are elevated. Please clarify if the patient has had any recent infections.

## 2020-03-18 ENCOUNTER — Encounter: Payer: Self-pay | Admitting: Family

## 2020-03-18 NOTE — Progress Notes (Signed)
Office Visit Note   Patient: Annette Suarez           Date of Birth: 1940/12/04           MRN: [id number] Visit Date: 03/14/2020              Requested by: Shirline Frees, MD [place],  [place] [address] PCP: Shirline Frees, MD  Chief Complaint  Patient presents with  . Right Knee - Follow-up      HPI: The patient is a 79 year old woman who presents to the day 1 week follow-up for acute right knee pain.  About 10 days ago she twisted her knee and felt something pull she went on to have progressive difficulty bearing weight due to pain especially in the medial aspect of her knee pain was running up and down from her knee she did not have any associated swelling or bruising no calf pain  She does have a history of Baker's cyst as well as rheumatoid arthritis  At last visit about a week ago she did receive a Depo-Medrol injection she felt this provided her with excellent reduction of her pain today she is ambulating with a cane and a knee brace on the right knee pleased with her improvement  Assessment & Plan: Visit Diagnoses:  1. Acute pain of right knee     Plan: Findings consistent with a degenerative meniscus tear.  At this point we will proceed conservatively allow her some time to see if she continues to improve.  She will follow-up in about 3 weeks if she is having ongoing pain we will consider repeat injection versus MRI of the knee  Follow-Up Instructions: Return in about 3 weeks (around 04/04/2020).   Right Knee Exam   Muscle Strength  The patient has normal right knee strength.  Tenderness  The patient is experiencing tenderness in the medial joint line.  Range of Motion  The patient has normal right knee ROM.  Tests  Varus: negative Valgus: negative  Other  Erythema: absent Swelling: mild  Comments:  No pain of the MCL today      Patient is alert, oriented, no adenopathy, well-dressed, normal affect, normal respiratory  effort.   Imaging: No results found. No images are attached to the encounter.  Labs: Lab Results  Component Value Date   ESRSEDRATE 32 (H) 09/08/2016   LABURIC 5.7 09/12/2017     Lab Results  Component Value Date   ALBUMIN 3.2 (L) 09/09/2017   ALBUMIN 3.9 09/08/2017   ALBUMIN 4.5 01/06/2017   LABURIC 5.7 09/12/2017    Lab Results  Component Value Date   MG 1.2 (L) 09/09/2017   No results found for: VD25OH  No results found for: PREALBUMIN CBC EXTENDED Latest Ref Rng & Units 03/14/2020 12/31/2019 09/07/2019  WBC 3.8 - 10.8 Thousand/uL 13.1(H) 9.0 9.9  RBC 3.80 - 5.10 Million/uL 4.33 4.12 4.13  HGB 11.7 - 15.5 g/dL 13.5 12.9 12.9  HCT 35 - 45 % 40.1 37.7 37.6  PLT 140 - 400 Thousand/uL 357 292 287  NEUTROABS 1,500 - 7,800 cells/uL 9,367(H) 5,652 6,425  LYMPHSABS 850 - 3,900 cells/uL 2,764 2,376 2,396     There is no height or weight on file to calculate BMI.  Orders:  No orders of the defined types were placed in this encounter.  No orders of the defined types were placed in this encounter.    Procedures: No procedures performed  Clinical Data: No additional findings.  ROS:  All other systems negative, except as noted in the HPI. Review of Systems  Objective: Vital Signs: There were no vitals taken for this visit.  Specialty Comments:  No specialty comments available.  PMFS History: Patient Active Problem List   Diagnosis Date Noted  . Primary osteoarthritis of both feet 12/13/2017  . Dehydration 09/09/2017  . Acute lower UTI 09/09/2017  . Nausea & vomiting 09/08/2017  . Primary osteoarthritis of both hands 04/05/2017  . Left carpal tunnel syndrome 11/15/2016  . Rheumatoid arthritis of multiple sites with negative rheumatoid factor ([place]) 10/06/2016  . High risk medication use 10/06/2016  . Dyslipidemia 10/06/2016  . Pain in both hands 09/08/2016  . Essential hypertension 09/08/2016  . Type 2 diabetes mellitus  09/08/2016  . Gastroesophageal  reflux disease without esophagitis 09/08/2016  . Osteopenia of multiple sites 09/08/2016  . History of anxiety 09/08/2016  . Benign paroxysmal positional vertigo 09/08/2016  . Smoker 09/08/2016  . Sleep apnea 09/08/2016   Past Medical History:  Diagnosis Date  . Acute cystitis   . Anxiety   . Arthritis   . Diabetes mellitus without complication ([place])   . GERD (gastroesophageal reflux disease)   . Goiter, nontoxic, multinodular   . Hypertension   . Incomplete bladder emptying   . Postmenopausal atrophic vaginitis   . RA (rheumatoid arthritis) ([place])   . Rectocele   . Renal calculus   . Urolithiasis   . Vaginal enterocele     Family History  Problem Relation Age of Onset  . Hypertension Other   . Diabetes Mother   . Heart disease Father   . Diabetes Brother   . Cancer Sister        breast   . Bladder Cancer Brother   . Diabetes Son   . Crohn's disease Son     Past Surgical History:  Procedure Laterality Date  . CHOLECYSTECTOMY    . SQUAMOUS CELL CARCINOMA EXCISION  05/2018   Social History   Occupational History  . Not on file  Tobacco Use  . Smoking status: Former Smoker    Packs/day: 0.20    Years: 10.00    Pack years: 2.00    Types: Cigarettes    Quit date: 07/05/1978    Years since quitting: 41.7  . Smokeless tobacco: Never Used  Vaping Use  . Vaping Use: Never used  Substance and Sexual Activity  . Alcohol use: No  . Drug use: No  . Sexual activity: Not on file

## 2020-03-24 ENCOUNTER — Ambulatory Visit: Payer: Medicare Other | Admitting: Orthopedic Surgery

## 2020-03-24 ENCOUNTER — Encounter: Payer: Self-pay | Admitting: Physician Assistant

## 2020-03-24 VITALS — Ht 62.0 in | Wt 164.0 lb

## 2020-03-24 DIAGNOSIS — M1711 Unilateral primary osteoarthritis, right knee: Secondary | ICD-10-CM

## 2020-03-25 ENCOUNTER — Encounter: Payer: Self-pay | Admitting: Orthopedic Surgery

## 2020-03-25 DIAGNOSIS — M1711 Unilateral primary osteoarthritis, right knee: Secondary | ICD-10-CM | POA: Diagnosis not present

## 2020-03-25 MED ORDER — METHYLPREDNISOLONE ACETATE 40 MG/ML IJ SUSP
40.0000 mg | INTRAMUSCULAR | Status: AC | PRN
  Administered 2020-03-25: 40 mg via INTRA_ARTICULAR

## 2020-03-25 MED ORDER — LIDOCAINE HCL (PF) 1 % IJ SOLN
5.0000 mL | INTRAMUSCULAR | Status: AC | PRN
  Administered 2020-03-25: 5 mL

## 2020-03-25 NOTE — Progress Notes (Signed)
Office Visit Note   Patient: Annette Suarez           Date of Birth: 1941-01-17           MRN: [id number] Visit Date: 03/24/2020              Requested by: Shirline Frees, MD [place],  [place] [address] PCP: Shirline Frees, MD  Chief Complaint  Patient presents with  . Right Knee - Pain      HPI: Patient is a 79 year old woman who has been having increasing pain from right knee arthritis she states that while going up her stairs the knee went out she has pain with ambulation decreased range of motion she states that cortisone injections in the past had helped she is currently nonweightbearing with a patella sleeve.  Patient states she has tried using the brace ice without relief.  Assessment & Plan: Visit Diagnoses:  1. Unilateral primary osteoarthritis, right knee     Plan: Right knee is injected for temporary relief we will set up an MRI scan to see if there is any meniscal or cartilage pathology.  Follow-up after the MRI scan.  Follow-Up Instructions: No follow-ups on file.   Ortho Exam  Patient is alert, oriented, no adenopathy, well-dressed, normal affect, normal respiratory effort. Review of the radiographs that were obtained this month showed medial joint line narrowing with calcification of the meniscus.  Patient has pain primarily over the medial joint line collaterals and cruciates are stable there is minimal swelling no skin abrasions.  Imaging: No results found. No images are attached to the encounter.  Labs: Lab Results  Component Value Date   ESRSEDRATE 32 (H) 09/08/2016   LABURIC 5.7 09/12/2017     Lab Results  Component Value Date   ALBUMIN 3.2 (L) 09/09/2017   ALBUMIN 3.9 09/08/2017   ALBUMIN 4.5 01/06/2017   LABURIC 5.7 09/12/2017    Lab Results  Component Value Date   MG 1.2 (L) 09/09/2017   No results found for: VD25OH  No results found for: PREALBUMIN CBC EXTENDED Latest Ref Rng & Units 03/14/2020  12/31/2019 09/07/2019  WBC 3.8 - 10.8 Thousand/uL 13.1(H) 9.0 9.9  RBC 3.80 - 5.10 Million/uL 4.33 4.12 4.13  HGB 11.7 - 15.5 g/dL 13.5 12.9 12.9  HCT 35 - 45 % 40.1 37.7 37.6  PLT 140 - 400 Thousand/uL 357 292 287  NEUTROABS 1,500 - 7,800 cells/uL 9,367(H) 5,652 6,425  LYMPHSABS 850 - 3,900 cells/uL 2,764 2,376 2,396     Body mass index is 30 kg/m.  Orders:  Orders Placed This Encounter  Procedures  . MR Knee Right w/o contrast   No orders of the defined types were placed in this encounter.    Procedures: Large Joint Inj: R knee on 03/25/2020 12:47 PM Indications: pain and diagnostic evaluation Details: 22 G 1.5 in needle, anteromedial approach  Arthrogram: No  Medications: 5 mL lidocaine (PF) 1 %; 40 mg methylPREDNISolone acetate 40 MG/ML Outcome: tolerated well, no immediate complications Procedure, treatment alternatives, risks and benefits explained, specific risks discussed. Consent was given by the patient. Immediately prior to procedure a time out was called to verify the correct patient, procedure, equipment, support staff and site/side marked as required. Patient was prepped and draped in the usual sterile fashion.      Clinical Data: No additional findings.  ROS:  All other systems negative, except as noted in the HPI. Review of Systems  Objective: Vital Signs:  Ht 5\' 2"  (1.575 m)   Wt 164 lb (74.4 kg)   BMI 30.00 kg/m   Specialty Comments:  No specialty comments available.  PMFS History: Patient Active Problem List   Diagnosis Date Noted  . Primary osteoarthritis of both feet 12/13/2017  . Dehydration 09/09/2017  . Acute lower UTI 09/09/2017  . Nausea & vomiting 09/08/2017  . Primary osteoarthritis of both hands 04/05/2017  . Left carpal tunnel syndrome 11/15/2016  . Rheumatoid arthritis of multiple sites with negative rheumatoid factor ([place]) 10/06/2016  . High risk medication use 10/06/2016  . Dyslipidemia 10/06/2016  . Pain in both hands  09/08/2016  . Essential hypertension 09/08/2016  . Type 2 diabetes mellitus  09/08/2016  . Gastroesophageal reflux disease without esophagitis 09/08/2016  . Osteopenia of multiple sites 09/08/2016  . History of anxiety 09/08/2016  . Benign paroxysmal positional vertigo 09/08/2016  . Smoker 09/08/2016  . Sleep apnea 09/08/2016   Past Medical History:  Diagnosis Date  . Acute cystitis   . Anxiety   . Arthritis   . Diabetes mellitus without complication ([place])   . GERD (gastroesophageal reflux disease)   . Goiter, nontoxic, multinodular   . Hypertension   . Incomplete bladder emptying   . Postmenopausal atrophic vaginitis   . RA (rheumatoid arthritis) ([place])   . Rectocele   . Renal calculus   . Urolithiasis   . Vaginal enterocele     Family History  Problem Relation Age of Onset  . Hypertension Other   . Diabetes Mother   . Heart disease Father   . Diabetes Brother   . Cancer Sister        breast   . Bladder Cancer Brother   . Diabetes Son   . Crohn's disease Son     Past Surgical History:  Procedure Laterality Date  . CHOLECYSTECTOMY    . SQUAMOUS CELL CARCINOMA EXCISION  05/2018   Social History   Occupational History  . Not on file  Tobacco Use  . Smoking status: Former Smoker    Packs/day: 0.20    Years: 10.00    Pack years: 2.00    Types: Cigarettes    Quit date: 07/05/1978    Years since quitting: 41.7  . Smokeless tobacco: Never Used  Vaping Use  . Vaping Use: Never used  Substance and Sexual Activity  . Alcohol use: No  . Drug use: No  . Sexual activity: Not on file

## 2020-03-27 ENCOUNTER — Telehealth: Payer: Self-pay | Admitting: Orthopedic Surgery

## 2020-03-27 ENCOUNTER — Other Ambulatory Visit: Payer: Self-pay | Admitting: Physician Assistant

## 2020-03-27 MED ORDER — DIAZEPAM 5 MG PO TABS: ORAL_TABLET | ORAL | 0 refills | Status: DC

## 2020-03-27 NOTE — Telephone Encounter (Signed)
1 valium was called in to take 30 minutes before MRI . She needs to be sure she has a ride

## 2020-03-27 NOTE — Telephone Encounter (Signed)
Patient needs premedication for upcoming MRI on 10/18. Please send prescription to pharmacy on file for patient for she states to be claustrophobic. Patient phone number is [phone] or [phone].

## 2020-03-27 NOTE — Telephone Encounter (Signed)
Please see below.

## 2020-03-27 NOTE — Telephone Encounter (Signed)
I called and lm on vm to advise of message below. To call with any questions.  

## 2020-04-04 ENCOUNTER — Ambulatory Visit: Payer: Medicare Other | Admitting: Physician Assistant

## 2020-04-14 ENCOUNTER — Ambulatory Visit: Payer: Medicare Other | Admitting: Orthopedic Surgery

## 2020-04-15 ENCOUNTER — Other Ambulatory Visit: Payer: Self-pay

## 2020-04-15 DIAGNOSIS — M0609 Rheumatoid arthritis without rheumatoid factor, multiple sites: Secondary | ICD-10-CM

## 2020-04-15 MED ORDER — ENBREL SURECLICK 50 MG/ML ~~LOC~~ SOAJ: 50.0000 mg | SUBCUTANEOUS | 0 refills | Status: DC

## 2020-04-15 NOTE — Telephone Encounter (Signed)
Refill request received via fax from [facility] for enbrel.   Last Visit: 03/07/2020 Next Visit: 07/14/2020 Labs: 03/14/2020 Glucose is elevated-178. Creatinine is elevated-1.12. GFR is low-47 but stable. AST borderline low. ALT WNL. CBC elevated-13.1-absolute neutrophil are elevated.   TB Gold:  07/25/2019 negative   Current Dose per office note on 03/07/2020: Enbrel 50 mg sq injections once weekly.  DX: Rheumatoid arthritis of multiple sites with negative rheumatoid factor  Okay to refill enbrel?

## 2020-04-17 ENCOUNTER — Ambulatory Visit
Admission: RE | Admit: 2020-04-17 | Discharge: 2020-04-17 | Disposition: A | Discharge status: Home / Self Care | Payer: Medicare Other | Source: Ambulatory Visit | Attending: Orthopedic Surgery | Admitting: Orthopedic Surgery

## 2020-04-17 ENCOUNTER — Other Ambulatory Visit: Payer: Self-pay

## 2020-04-17 DIAGNOSIS — M1711 Unilateral primary osteoarthritis, right knee: Secondary | ICD-10-CM

## 2020-04-21 ENCOUNTER — Encounter: Payer: Self-pay | Admitting: Orthopedic Surgery

## 2020-04-21 ENCOUNTER — Telehealth: Payer: Self-pay

## 2020-04-21 ENCOUNTER — Ambulatory Visit: Payer: Medicare Other | Admitting: Orthopedic Surgery

## 2020-04-21 VITALS — Ht 62.0 in | Wt 164.0 lb

## 2020-04-21 DIAGNOSIS — M1711 Unilateral primary osteoarthritis, right knee: Secondary | ICD-10-CM | POA: Diagnosis not present

## 2020-04-21 DIAGNOSIS — M23203 Derangement of unspecified medial meniscus due to old tear or injury, right knee: Secondary | ICD-10-CM

## 2020-04-21 NOTE — Telephone Encounter (Signed)
Patient advised will need to fill out application. Patient states she will come to office to pick up. Application form left at front for patient pick up.

## 2020-04-21 NOTE — Telephone Encounter (Signed)
Attempted to contact the patient and left message for patient to call the office.   Patient will need to fill out application. We can mail the application or she can pick it up from the office.

## 2020-04-21 NOTE — Progress Notes (Signed)
Office Visit Note   Patient: Annette Suarez           Date of Birth: 1940/07/11           MRN: [id number] Visit Date: 04/21/2020              Requested by: Shirline Frees, MD [address] [place],  [place] [address] PCP: Shirline Frees, MD  Chief Complaint  Patient presents with  . Right Knee - Follow-up    MRI review       HPI: Patient is a 79 year old woman who presents with persistent pain of her right knee she complains of medial joint line mechanical symptoms she has been using a brace using a cane states that she has not been able to perform activities of daily living for several months.  She is status post MRI scan.  Assessment & Plan: Visit Diagnoses:  1. Unilateral primary osteoarthritis, right knee   2. Old complex tear of medial meniscus of right knee     Plan: We reviewed her MRI scan findings discussed that arthroscopic intervention is an option discussed that she may have arthritic symptoms after debridement.  Patient states she understands risk and benefits were reviewed patient states she would like to proceed with surgery as soon as possible.  Follow-Up Instructions: Return if symptoms worsen or fail to improve.   Ortho Exam  Patient is alert, oriented, no adenopathy, well-dressed, normal affect, normal respiratory effort. Examination patient has antalgic gait she uses a cane she has swelling around the right knee she is tender to palpation of the medial and lateral joint line collaterals are cruciates are stable she has tenderness to palpation in the popliteal fossa.  Review of the MRI scan shows a large tear of the posterior horn of the medial meniscus as well as arthritic changes of the medial joint line as well as the patellofemoral joint patient also has an effusion with a large Baker's cyst.  Imaging: No results found. No images are attached to the encounter.  Labs: Lab Results  Component Value Date   ESRSEDRATE 32 (H) 09/08/2016    LABURIC 5.7 09/12/2017     Lab Results  Component Value Date   ALBUMIN 3.2 (L) 09/09/2017   ALBUMIN 3.9 09/08/2017   ALBUMIN 4.5 01/06/2017   LABURIC 5.7 09/12/2017    Lab Results  Component Value Date   MG 1.2 (L) 09/09/2017   No results found for: VD25OH  No results found for: PREALBUMIN CBC EXTENDED Latest Ref Rng & Units 03/14/2020 12/31/2019 09/07/2019  WBC 3.8 - 10.8 Thousand/uL 13.1(H) 9.0 9.9  RBC 3.80 - 5.10 Million/uL 4.33 4.12 4.13  HGB 11.7 - 15.5 g/dL 13.5 12.9 12.9  HCT 35 - 45 % 40.1 37.7 37.6  PLT 140 - 400 Thousand/uL 357 292 287  NEUTROABS 1,500 - 7,800 cells/uL 9,367(H) 5,652 6,425  LYMPHSABS 850 - 3,900 cells/uL 2,764 2,376 2,396     Body mass index is 30 kg/m.  Orders:  No orders of the defined types were placed in this encounter.  No orders of the defined types were placed in this encounter.    Procedures: No procedures performed  Clinical Data: No additional findings.  ROS:  All other systems negative, except as noted in the HPI. Review of Systems  Objective: Vital Signs: Ht 5\' 2"  (1.575 m)   Wt 164 lb (74.4 kg)   BMI 30.00 kg/m   Specialty Comments:  No specialty comments available.  [place]  History: Patient Active Problem List   Diagnosis Date Noted  . Primary osteoarthritis of both feet 12/13/2017  . Dehydration 09/09/2017  . Acute lower UTI 09/09/2017  . Nausea & vomiting 09/08/2017  . Primary osteoarthritis of both hands 04/05/2017  . Left carpal tunnel syndrome 11/15/2016  . Rheumatoid arthritis of multiple sites with negative rheumatoid factor ([place]) 10/06/2016  . High risk medication use 10/06/2016  . Dyslipidemia 10/06/2016  . Pain in both hands 09/08/2016  . Essential hypertension 09/08/2016  . Type 2 diabetes mellitus  09/08/2016  . Gastroesophageal reflux disease without esophagitis 09/08/2016  . Osteopenia of multiple sites 09/08/2016  . History of anxiety 09/08/2016  . Benign paroxysmal positional vertigo  09/08/2016  . Smoker 09/08/2016  . Sleep apnea 09/08/2016   Past Medical History:  Diagnosis Date  . Acute cystitis   . Anxiety   . Arthritis   . Diabetes mellitus without complication ([place])   . GERD (gastroesophageal reflux disease)   . Goiter, nontoxic, multinodular   . Hypertension   . Incomplete bladder emptying   . Postmenopausal atrophic vaginitis   . RA (rheumatoid arthritis) ([place])   . Rectocele   . Renal calculus   . Urolithiasis   . Vaginal enterocele     Family History  Problem Relation Age of Onset  . Hypertension Other   . Diabetes Mother   . Heart disease Father   . Diabetes Brother   . Cancer Sister        breast   . Bladder Cancer Brother   . Diabetes Son   . Crohn's disease Son     Past Surgical History:  Procedure Laterality Date  . CHOLECYSTECTOMY    . SQUAMOUS CELL CARCINOMA EXCISION  05/2018   Social History   Occupational History  . Not on file  Tobacco Use  . Smoking status: Former Smoker    Packs/day: 0.20    Years: 10.00    Pack years: 2.00    Types: Cigarettes    Quit date: 07/05/1978    Years since quitting: 41.8  . Smokeless tobacco: Never Used  Vaping Use  . Vaping Use: Never used  Substance and Sexual Activity  . Alcohol use: No  . Drug use: No  . Sexual activity: Not on file

## 2020-04-21 NOTE — Telephone Encounter (Signed)
Patient called stating she needs to fill out a new application to patient assistance program for her Enbrel prescription.  Patient states last year "the girl at our office completed the application and she  just stopped by to sign the paperwork."  Patient is requesting a return call.

## 2020-05-01 ENCOUNTER — Other Ambulatory Visit: Payer: Self-pay

## 2020-05-07 ENCOUNTER — Other Ambulatory Visit: Payer: Self-pay | Admitting: Physician Assistant

## 2020-05-08 ENCOUNTER — Ambulatory Visit: Payer: Medicare Other | Admitting: Internal Medicine

## 2020-05-12 ENCOUNTER — Telehealth: Payer: Self-pay

## 2020-05-12 NOTE — Telephone Encounter (Signed)
Returned patient's call. She says she just got off the phone with [employer] and they advised they have all the information they need. Applications is in review. Nothing further needed at this time.

## 2020-05-12 NOTE — Telephone Encounter (Signed)
Patient called stating she received a letter from [employer] stating "her application was not completely filled out."  Patient states she completed the patient portion of the application and dropped it off at the office.  Patient states Seth Bake told her they would complete the provider portion of the application and submit it.  Patient states she is not sure which part was not completed and requesting a return call.

## 2020-05-13 ENCOUNTER — Encounter (HOSPITAL_BASED_OUTPATIENT_CLINIC_OR_DEPARTMENT_OTHER): Payer: Self-pay | Admitting: Orthopedic Surgery

## 2020-05-13 ENCOUNTER — Telehealth: Payer: Self-pay | Admitting: Pharmacy Technician

## 2020-05-13 ENCOUNTER — Other Ambulatory Visit: Payer: Self-pay

## 2020-05-13 NOTE — Telephone Encounter (Signed)
Received notification from [facility] regarding a prior authorization for ENBREL. Authorization has been APPROVED from 07/05/20 until 2/31/22.   Authorization # [id number]

## 2020-05-16 ENCOUNTER — Other Ambulatory Visit (HOSPITAL_COMMUNITY)
Admission: RE | Admit: 2020-05-16 | Discharge: 2020-05-16 | Disposition: A | Discharge status: Home / Self Care | Payer: Medicare Other | Source: Ambulatory Visit | Attending: Orthopedic Surgery | Admitting: Orthopedic Surgery

## 2020-05-16 ENCOUNTER — Other Ambulatory Visit: Payer: Self-pay

## 2020-05-16 ENCOUNTER — Encounter (HOSPITAL_BASED_OUTPATIENT_CLINIC_OR_DEPARTMENT_OTHER)
Admission: RE | Admit: 2020-05-16 | Discharge: 2020-05-16 | Disposition: A | Discharge status: Home / Self Care | Payer: Medicare Other | Source: Ambulatory Visit | Attending: Orthopedic Surgery | Admitting: Orthopedic Surgery

## 2020-05-16 DIAGNOSIS — I10 Essential (primary) hypertension: Secondary | ICD-10-CM | POA: Insufficient documentation

## 2020-05-16 DIAGNOSIS — Z20822 Contact with and (suspected) exposure to covid-19: Secondary | ICD-10-CM | POA: Insufficient documentation

## 2020-05-16 DIAGNOSIS — Z01812 Encounter for preprocedural laboratory examination: Secondary | ICD-10-CM | POA: Diagnosis not present

## 2020-05-16 DIAGNOSIS — Z01818 Encounter for other preprocedural examination: Secondary | ICD-10-CM | POA: Insufficient documentation

## 2020-05-16 DIAGNOSIS — E119 Type 2 diabetes mellitus without complications: Secondary | ICD-10-CM | POA: Insufficient documentation

## 2020-05-16 LAB — BASIC METABOLIC PANEL
Anion gap: 9 (ref 5–15)
BUN: 13 mg/dL (ref 8–23)
CO2: 27 mmol/L (ref 22–32)
Calcium: 9.2 mg/dL (ref 8.9–10.3)
Chloride: 102 mmol/L (ref 98–111)
Creatinine, Ser: 0.88 mg/dL (ref 0.44–1.00)
GFR, Estimated: >60 mL/min (ref >60)
Glucose, Bld: 236 mg/dL — ABNORMAL HIGH (ref 70–99)
Potassium: 4.5 mmol/L (ref 3.5–5.1)
Sodium: 138 mmol/L (ref 135–145)

## 2020-05-16 LAB — SARS CORONAVIRUS 2 (TAT 6-24 HRS): SARS Coronavirus 2: NEGATIVE

## 2020-05-16 NOTE — Progress Notes (Signed)

## 2020-05-20 ENCOUNTER — Encounter (HOSPITAL_BASED_OUTPATIENT_CLINIC_OR_DEPARTMENT_OTHER)
Admission: RE | Disposition: A | Discharge status: Home / Self Care | Payer: Self-pay | Source: Home / Self Care | Attending: Orthopedic Surgery

## 2020-05-20 ENCOUNTER — Ambulatory Visit (HOSPITAL_BASED_OUTPATIENT_CLINIC_OR_DEPARTMENT_OTHER)
Admission: RE | Admit: 2020-05-20 | Discharge: 2020-05-20 | Disposition: A | Discharge status: Home / Self Care | Payer: Medicare Other | Attending: Orthopedic Surgery | Admitting: Orthopedic Surgery

## 2020-05-20 ENCOUNTER — Ambulatory Visit (HOSPITAL_BASED_OUTPATIENT_CLINIC_OR_DEPARTMENT_OTHER): Payer: Medicare Other | Admitting: Anesthesiology

## 2020-05-20 ENCOUNTER — Encounter (HOSPITAL_BASED_OUTPATIENT_CLINIC_OR_DEPARTMENT_OTHER): Payer: Self-pay | Admitting: Orthopedic Surgery

## 2020-05-20 ENCOUNTER — Other Ambulatory Visit: Payer: Self-pay

## 2020-05-20 DIAGNOSIS — S83241A Other tear of medial meniscus, current injury, right knee, initial encounter: Secondary | ICD-10-CM | POA: Diagnosis not present

## 2020-05-20 DIAGNOSIS — X58XXXA Exposure to other specified factors, initial encounter: Secondary | ICD-10-CM | POA: Diagnosis not present

## 2020-05-20 DIAGNOSIS — M23231 Derangement of other medial meniscus due to old tear or injury, right knee: Secondary | ICD-10-CM

## 2020-05-20 DIAGNOSIS — M65861 Other synovitis and tenosynovitis, right lower leg: Secondary | ICD-10-CM | POA: Diagnosis not present

## 2020-05-20 DIAGNOSIS — M93861 Other specified osteochondropathies, right lower leg: Secondary | ICD-10-CM

## 2020-05-20 DIAGNOSIS — Z91040 Latex allergy status: Secondary | ICD-10-CM | POA: Diagnosis not present

## 2020-05-20 DIAGNOSIS — M23261 Derangement of other lateral meniscus due to old tear or injury, right knee: Secondary | ICD-10-CM

## 2020-05-20 DIAGNOSIS — M23200 Derangement of unspecified lateral meniscus due to old tear or injury, right knee: Secondary | ICD-10-CM | POA: Diagnosis not present

## 2020-05-20 DIAGNOSIS — M1711 Unilateral primary osteoarthritis, right knee: Secondary | ICD-10-CM | POA: Insufficient documentation

## 2020-05-20 DIAGNOSIS — M23203 Derangement of unspecified medial meniscus due to old tear or injury, right knee: Secondary | ICD-10-CM

## 2020-05-20 DIAGNOSIS — Z87891 Personal history of nicotine dependence: Secondary | ICD-10-CM | POA: Insufficient documentation

## 2020-05-20 HISTORY — DX: Family history of other specified conditions: Z84.89

## 2020-05-20 HISTORY — PX: KNEE ARTHROSCOPY: SHX127

## 2020-05-20 LAB — GLUCOSE, CAPILLARY
Glucose-Capillary: 194 mg/dL — ABNORMAL HIGH (ref 70–99)
Glucose-Capillary: 208 mg/dL — ABNORMAL HIGH (ref 70–99)

## 2020-05-20 SURGERY — ARTHROSCOPY, KNEE
Anesthesia: General | Site: Knee | Laterality: Right

## 2020-05-20 MED ORDER — MIDAZOLAM HCL 5 MG/5ML IJ SOLN
INTRAMUSCULAR | Status: DC | PRN
  Administered 2020-05-20: 1 mg via INTRAVENOUS

## 2020-05-20 MED ORDER — BUPIVACAINE HCL (PF) 0.5 % IJ SOLN
INTRAMUSCULAR | Status: DC | PRN
  Administered 2020-05-20: 30 mL

## 2020-05-20 MED ORDER — HYDROCODONE-ACETAMINOPHEN 5-325 MG PO TABS: 1.0000 | ORAL_TABLET | ORAL | 0 refills | Status: DC | PRN

## 2020-05-20 MED ORDER — METOPROLOL TARTRATE 5 MG/5ML IV SOLN
INTRAVENOUS | Status: AC
  Filled 2020-05-20: qty 5

## 2020-05-20 MED ORDER — ONDANSETRON HCL 4 MG/2ML IJ SOLN
INTRAMUSCULAR | Status: DC | PRN
  Administered 2020-05-20: 4 mg via INTRAVENOUS

## 2020-05-20 MED ORDER — LIDOCAINE 2% (20 MG/ML) 5 ML SYRINGE
INTRAMUSCULAR | Status: AC
  Filled 2020-05-20: qty 5

## 2020-05-20 MED ORDER — SODIUM CHLORIDE 0.9 % IR SOLN
Status: DC | PRN
  Administered 2020-05-20: 3000 mL

## 2020-05-20 MED ORDER — DEXAMETHASONE SODIUM PHOSPHATE 4 MG/ML IJ SOLN
INTRAMUSCULAR | Status: DC | PRN
  Administered 2020-05-20: 5 mg via INTRAVENOUS

## 2020-05-20 MED ORDER — ACETAMINOPHEN 500 MG PO TABS: 1000.0000 mg | ORAL_TABLET | Freq: Once | ORAL | Status: DC

## 2020-05-20 MED ORDER — MIDAZOLAM HCL 2 MG/2ML IJ SOLN
INTRAMUSCULAR | Status: AC
  Filled 2020-05-20: qty 2

## 2020-05-20 MED ORDER — ONDANSETRON HCL 4 MG/2ML IJ SOLN
INTRAMUSCULAR | Status: AC
  Filled 2020-05-20: qty 2

## 2020-05-20 MED ORDER — METOPROLOL TARTRATE 5 MG/5ML IV SOLN
INTRAVENOUS | Status: DC | PRN
  Administered 2020-05-20 (×2): 1 mg via INTRAVENOUS

## 2020-05-20 MED ORDER — FENTANYL CITRATE (PF) 100 MCG/2ML IJ SOLN
INTRAMUSCULAR | Status: AC
  Filled 2020-05-20: qty 2

## 2020-05-20 MED ORDER — FENTANYL CITRATE (PF) 100 MCG/2ML IJ SOLN
25.0000 ug | INTRAMUSCULAR | Status: DC | PRN
  Administered 2020-05-20: 50 ug via INTRAVENOUS

## 2020-05-20 MED ORDER — PROPOFOL 10 MG/ML IV BOLUS
INTRAVENOUS | Status: DC | PRN
  Administered 2020-05-20: 100 mg via INTRAVENOUS

## 2020-05-20 MED ORDER — CEFAZOLIN SODIUM-DEXTROSE 2-4 GM/100ML-% IV SOLN
INTRAVENOUS | Status: AC
  Filled 2020-05-20: qty 100

## 2020-05-20 MED ORDER — CEFAZOLIN SODIUM-DEXTROSE 2-4 GM/100ML-% IV SOLN
2.0000 g | INTRAVENOUS | Status: AC
  Administered 2020-05-20: 2 g via INTRAVENOUS

## 2020-05-20 MED ORDER — LACTATED RINGERS IV SOLN: INTRAVENOUS | Status: DC

## 2020-05-20 MED ORDER — LIDOCAINE HCL (CARDIAC) PF 100 MG/5ML IV SOSY
PREFILLED_SYRINGE | INTRAVENOUS | Status: DC | PRN
  Administered 2020-05-20: 60 mg via INTRAVENOUS

## 2020-05-20 MED ORDER — FENTANYL CITRATE (PF) 100 MCG/2ML IJ SOLN
INTRAMUSCULAR | Status: DC | PRN
  Administered 2020-05-20 (×2): 50 ug via INTRAVENOUS

## 2020-05-20 MED ORDER — PHENYLEPHRINE 40 MCG/ML (10ML) SYRINGE FOR IV PUSH (FOR BLOOD PRESSURE SUPPORT)
PREFILLED_SYRINGE | INTRAVENOUS | Status: DC | PRN
  Administered 2020-05-20: 180 ug via INTRAVENOUS

## 2020-05-20 SURGICAL SUPPLY — 28 items
BLADE EXCALIBUR 4.0X13 (MISCELLANEOUS) ×2 IMPLANT
BNDG COHESIVE 6X5 TAN STRL LF (GAUZE/BANDAGES/DRESSINGS) ×2 IMPLANT
COVER WAND RF STERILE (DRAPES) IMPLANT
DISSECTOR 4.0MM X 13CM (MISCELLANEOUS) IMPLANT
DRAPE ARTHROSCOPY W/POUCH 90 (DRAPES) ×2 IMPLANT
DRAPE U-SHAPE 47X51 STRL (DRAPES) ×2 IMPLANT
DRSG EMULSION OIL 3X3 NADH (GAUZE/BANDAGES/DRESSINGS) ×2 IMPLANT
DURAPREP 26ML APPLICATOR (WOUND CARE) ×2 IMPLANT
EXCALIBUR 3.8MM X 13CM (MISCELLANEOUS) IMPLANT
GAUZE SPONGE 4X4 12PLY STRL (GAUZE/BANDAGES/DRESSINGS) ×2 IMPLANT
GLOVE BIOGEL PI IND STRL 9 (GLOVE) ×1 IMPLANT
GLOVE BIOGEL PI INDICATOR 9 (GLOVE) ×1
GLOVE SURG ORTHO 9.0 STRL STRW (GLOVE) ×2 IMPLANT
GOWN STRL REUS W/ TWL LRG LVL3 (GOWN DISPOSABLE) ×1 IMPLANT
GOWN STRL REUS W/ TWL XL LVL3 (GOWN DISPOSABLE) ×1 IMPLANT
GOWN STRL REUS W/TWL LRG LVL3 (GOWN DISPOSABLE) ×2
GOWN STRL REUS W/TWL XL LVL3 (GOWN DISPOSABLE) ×2
MANIFOLD NEPTUNE II (INSTRUMENTS) IMPLANT
NDL SAFETY ECLIPSE 18X1.5 (NEEDLE) ×1 IMPLANT
NEEDLE HYPO 18GX1.5 SHARP (NEEDLE) ×2
PACK ARTHROSCOPY DSU (CUSTOM PROCEDURE TRAY) ×2 IMPLANT
PACK BASIN DAY SURGERY FS (CUSTOM PROCEDURE TRAY) ×2 IMPLANT
PORT APPOLLO RF 90DEGREE MULTI (SURGICAL WAND) IMPLANT
PROBE APOLLO 90XL (SURGICAL WAND) IMPLANT
SUT ETHILON 2 0 FSLX (SUTURE) ×2 IMPLANT
TOWEL GREEN STERILE FF (TOWEL DISPOSABLE) ×2 IMPLANT
TUBING ARTHROSCOPY IRRIG 16FT (MISCELLANEOUS) ×2 IMPLANT
WRAP KNEE MAXI GEL POST OP (GAUZE/BANDAGES/DRESSINGS) IMPLANT

## 2020-05-20 NOTE — Interval H&P Note (Signed)
History and Physical Interval Note:  05/20/2020 8:17 AM  Annette Suarez  has presented today for surgery, with the diagnosis of Medial Meniscal Tear Right Knee.  The various methods of treatment have been discussed with the patient and family. After consideration of risks, benefits and other options for treatment, the patient has consented to  Procedure(s): RIGHT KNEE ARTHROSCOPY AND DEBRIDEMENT (Right) as a surgical intervention.  The patient's history has been reviewed, patient examined, no change in status, stable for surgery.  I have reviewed the patient's chart and labs.  Questions were answered to the patient's satisfaction.     [name]

## 2020-05-20 NOTE — Op Note (Signed)
05/20/2020  9:29 AM  PATIENT:  Annette Suarez    PRE-OPERATIVE DIAGNOSIS:  Medial Meniscal Tear Right Knee  POST-OPERATIVE DIAGNOSIS:  Same  PROCEDURE:  RIGHT KNEE ARTHROSCOPY AND DEBRIDEMENT  SURGEON:  [name], MD  PHYSICIAN ASSISTANT:None ANESTHESIA:   General  PREOPERATIVE INDICATIONS:  Annette Suarez is a  79 y.o. female with a diagnosis of Medial Meniscal Tear Right Knee who failed conservative measures and elected for surgical management.    The risks benefits and alternatives were discussed with the patient preoperatively including but not limited to the risks of infection, bleeding, nerve injury, cardiopulmonary complications, the need for revision surgery, among others, and the patient was willing to proceed.  OPERATIVE IMPLANTS: None  @ENCIMAGES @  OPERATIVE FINDINGS: Large degenerative meniscal tear medial and lateral meniscus with large osteochondral defect of the medial femoral condyle medial tibial plateau patella and trochlea.  OPERATIVE PROCEDURE: Patient was brought the operating room underwent a general anesthetic.  After adequate levels anesthesia were obtaine patient's right lower extremity was prepped using DuraPrep draped in the sterile field a timeout was called.  The scope was inserted into the anterior lateral portal and an anterior medial working portal was established.  Visualization showed significant amount of synovitis this was debrided with a shaver the electrical wand was used for hemostasis.  With valgus stress the patient had a large medial meniscal tear that was debrided with a shaver she also had a large osteochondral defect of the tibial plateau and the medial femoral condyle.  The shaver was used to debride both lesions back to bleeding viable subchondral bone.  Examination of the notch showed an intact ACL and PCL.  Examination lateral joint line in the figure-of-four position also showed a complex tear of the lateral meniscus the articular cartilage  was intact.  There was further synovectomy performed electrocautery was used for hemostasis.  With the knee extended patient had more synovitis there was also a large osteochondral defects of the patella and trochlea there was a large plica.  The plica was excised the synovitis was excised the electrical wand was used for hemostasis.  A survey of all compartments showed there to be no loose bodies.  The instruments were removed the portals were closed using 2-0 nylon the joint was infused with 30 cc of quarter percent Marcaine plain patient tolerated this well she was extubated taken the PACU in stable condition   DISCHARGE PLANNING:  Antibiotic duration: Preoperative antibiotics Kefzol  Weightbearing: Weightbearing as tolerated  Pain medication: Prescription for Vicodin  Dressing care/ Wound VAC: Follow-up in the office in 1 week  Ambulatory devices: Walker or crutches  Discharge to: Home.  Follow-up: In the office 1 week post operative.

## 2020-05-20 NOTE — H&P (Signed)
Annette Suarez is an 79 y.o. female.   Chief Complaint: Right knee pain HPI: Patient is a 79 year old woman who presents with persistent pain of her right knee she complains of medial joint line mechanical symptoms she has been using a brace using a cane states that she has not been able to perform activities of daily living for several months.  She is status post MRI scan.  Past Medical History:  Diagnosis Date  . Acute cystitis   . Acute cystitis    UTI  . Anxiety   . Arthritis   . Diabetes mellitus without complication ([place])   . Family history of adverse reaction to anesthesia   . GERD (gastroesophageal reflux disease)   . Goiter, nontoxic, multinodular   . Gout   . H/O: hysterectomy   . Hx laparoscopic cholecystectomy   . Hypertension   . Incomplete bladder emptying   . Postmenopausal atrophic vaginitis   . RA (rheumatoid arthritis) ([place])   . Rectocele   . Renal calculus   . Thyroid condition   . Urolithiasis   . Vaginal enterocele     Past Surgical History:  Procedure Laterality Date  . CARPAL TUNNEL RELEASE  2018  . CHOLECYSTECTOMY    . GALLBLADDER SURGERY    . SQUAMOUS CELL CARCINOMA EXCISION  05/2018    Family History  Problem Relation Age of Onset  . Diabetes Mother   . Heart disease Father 57  . Diabetes Son   . Crohn's disease Son    Social History:  reports that she quit smoking about 41 years ago. Her smoking use included cigarettes. She has a 2.00 pack-year smoking history. She has never used smokeless tobacco. She reports that she does not drink alcohol and does not use drugs.  Allergies:  Allergies  Allergen Reactions  . Latex Rash    No medications prior to admission.    No results found for this or any previous visit (from the past 48 hour(s)). No results found.  Review of Systems  All other systems reviewed and are negative.   Height 5\' 1"  (1.549 m), weight 72.6 kg. Physical Exam  Patient is alert, oriented, no adenopathy, well-dressed,  normal affect, normal respiratory effort. Examination patient has antalgic gait she uses a cane she has swelling around the right knee she is tender to palpation of the medial and lateral joint line collaterals are cruciates are stable she has tenderness to palpation in the popliteal fossa.  Review of the MRI scan shows a large tear of the posterior horn of the medial meniscus as well as arthritic changes of the medial joint line as well as the patellofemoral joint patient also has an effusion with a large Baker's cyst.Heart RRR Lungs Clear Assessment/Plan 1. Unilateral primary osteoarthritis, right knee   2. Old complex tear of medial meniscus of right knee     Plan: We reviewed her MRI scan findings discussed that arthroscopic intervention is an option discussed that she may have arthritic symptoms after debridement.  Patient states she understands risk and benefits were reviewed patient states she would like to proceed with surgery as soon as possible.   Bevely Palmer Lolamae Voisin, PA 05/20/2020, 6:37 AM

## 2020-05-20 NOTE — Transfer of Care (Signed)
Immediate Anesthesia Transfer of Care Note  Patient: Annette Suarez  Procedure(s) Performed: RIGHT KNEE ARTHROSCOPY AND DEBRIDEMENT (Right Knee)  Patient Location: PACU  Anesthesia Type:General  Level of Consciousness: awake, alert  and oriented  Airway & Oxygen Therapy: Patient Spontanous Breathing and Patient connected to nasal cannula oxygen  Post-op Assessment: Report given to RN and Post -op Vital signs reviewed and stable  Post vital signs: Reviewed and stable  Last Vitals:  Vitals Value Taken Time  BP 210/89 05/20/20 0932  Temp    Pulse 87 05/20/20 0932  Resp 14 05/20/20 0932  SpO2 100 % 05/20/20 0932  Vitals shown include unvalidated device data.  Last Pain:  Vitals:   05/20/20 0719  TempSrc: Oral  PainSc: 4       Patients Stated Pain Goal: 5 (81/02/54 8628)  Complications: No complications documented.

## 2020-05-20 NOTE — Anesthesia Postprocedure Evaluation (Deleted)
Anesthesia Post Note  Patient: Annette Suarez  Procedure(s) Performed: RIGHT KNEE ARTHROSCOPY AND DEBRIDEMENT (Right Knee)     Patient location during evaluation: PACU Anesthesia Type: General Level of consciousness: awake and alert Pain management: pain level controlled Vital Signs Assessment: post-procedure vital signs reviewed and stable Respiratory status: spontaneous breathing, nonlabored ventilation, respiratory function stable and patient connected to nasal cannula oxygen Cardiovascular status: blood pressure returned to baseline and stable Postop Assessment: no apparent nausea or vomiting Anesthetic complications: no   No complications documented.  Last Vitals:  Vitals:   05/20/20 0942 05/20/20 0943  BP:    Pulse: 87 88  Resp: 20 19  Temp:    SpO2: 100% 100%    Last Pain:  Vitals:   05/20/20 0934  TempSrc:   PainSc: Asleep        RLE Motor Response:  (will assess when awake ) (05/20/20 0935)        Maddalena Linarez L Harrie Cazarez

## 2020-05-20 NOTE — Anesthesia Preprocedure Evaluation (Addendum)
Anesthesia Evaluation  Patient identified by MRN, date of birth, ID band Patient awake    Reviewed: Allergy & Precautions, NPO status , Patient's Chart, lab work & pertinent test results  Airway Mallampati: III  TM Distance: >3 FB Neck ROM: Full    Dental no notable dental hx. (+) Teeth Intact, Dental Advisory Given   Pulmonary sleep apnea , former smoker,    Pulmonary exam normal breath sounds clear to auscultation       Cardiovascular hypertension, negative cardio ROS Normal cardiovascular exam Rhythm:Regular Rate:Normal     Neuro/Psych PSYCHIATRIC DISORDERS Anxiety negative neurological ROS     GI/Hepatic Neg liver ROS, GERD  Medicated and Controlled,  Endo/Other  negative endocrine ROSdiabetes, Insulin Dependent  Renal/GU negative Renal ROS  negative genitourinary   Musculoskeletal  (+) Arthritis , Rheumatoid disorders,    Abdominal   Peds  Hematology negative hematology ROS (+)   Anesthesia Other Findings   Reproductive/Obstetrics                            Anesthesia Physical Anesthesia Plan  ASA: III  Anesthesia Plan: General   Post-op Pain Management:    Induction: Intravenous  PONV Risk Score and Plan: 3 and Ondansetron, Dexamethasone and Treatment may vary due to age or medical condition  Airway Management Planned: LMA  Additional Equipment:   Intra-op Plan:   Post-operative Plan: Extubation in OR  Informed Consent: I have reviewed the patients History and Physical, chart, labs and discussed the procedure including the risks, benefits and alternatives for the proposed anesthesia with the patient or authorized representative who has indicated his/her understanding and acceptance.     Dental advisory given  Plan Discussed with: CRNA  Anesthesia Plan Comments:         Anesthesia Quick Evaluation

## 2020-05-20 NOTE — Anesthesia Postprocedure Evaluation (Signed)
Anesthesia Post Note  Patient: Annette Suarez  Procedure(s) Performed: RIGHT KNEE ARTHROSCOPY AND DEBRIDEMENT (Right Knee)     Patient location during evaluation: PACU Anesthesia Type: General and Regional Level of consciousness: awake and alert Pain management: pain level controlled Vital Signs Assessment: post-procedure vital signs reviewed and stable Respiratory status: spontaneous breathing, nonlabored ventilation, respiratory function stable and patient connected to nasal cannula oxygen Cardiovascular status: blood pressure returned to baseline and stable Postop Assessment: no apparent nausea or vomiting Anesthetic complications: no   No complications documented.  Last Vitals:  Vitals:   05/20/20 1024 05/20/20 1026  BP:    Pulse: 92 92  Resp: 20 12  Temp:    SpO2: 100% 100%    Last Pain:  Vitals:   05/20/20 1007  TempSrc:   PainSc: 4                  Ilia Dimaano L Nyjah Denio

## 2020-05-20 NOTE — Anesthesia Procedure Notes (Signed)
Procedure Name: LMA Insertion Date/Time: 05/20/2020 8:51 AM Performed by: Bufford Spikes, CRNA Pre-anesthesia Checklist: Patient identified, Emergency Drugs available, Suction available and Patient being monitored Patient Re-evaluated:Patient Re-evaluated prior to induction Oxygen Delivery Method: Circle system utilized Preoxygenation: Pre-oxygenation with 100% oxygen Induction Type: IV induction Ventilation: Mask ventilation without difficulty LMA: LMA inserted LMA Size: 4.0 Number of attempts: 1 Airway Equipment and Method: Bite block Placement Confirmation: positive ETCO2 Tube secured with: Tape Dental Injury: Teeth and Oropharynx as per pre-operative assessment

## 2020-05-20 NOTE — Discharge Instructions (Signed)

## 2020-05-20 NOTE — Interval H&P Note (Signed)
History and Physical Interval Note:  05/20/2020 8:21 AM  Annette Suarez  has presented today for surgery, with the diagnosis of Medial Meniscal Tear Right Knee.  The various methods of treatment have been discussed with the patient and family. After consideration of risks, benefits and other options for treatment, the patient has consented to  Procedure(s): RIGHT KNEE ARTHROSCOPY AND DEBRIDEMENT (Right) as a surgical intervention.  The patient's history has been reviewed, patient examined, no change in status, stable for surgery.  I have reviewed the patient's chart and labs.  Questions were answered to the patient's satisfaction.     [name]

## 2020-05-21 ENCOUNTER — Encounter (HOSPITAL_BASED_OUTPATIENT_CLINIC_OR_DEPARTMENT_OTHER): Payer: Self-pay | Admitting: Orthopedic Surgery

## 2020-05-23 ENCOUNTER — Telehealth: Payer: Self-pay

## 2020-05-23 NOTE — Telephone Encounter (Signed)
Spoke with the patient she did not have a fall she just twisted her knee awkwardly.  She does not have any bleeding through the dressing.  She has been icing and elevating.  Taking Tylenol.  She was concerned if she retore the meniscus.  I explained to her that I could not give her an answer for this hopefully she has inflammation.  I recommended Aleve or Advil rather than Tylenol as it would help with the inflammation.  She does have a postop follow-up

## 2020-05-23 NOTE — Telephone Encounter (Signed)
Patient called left VM on triage phone. States she had SU Tuesday. Twisted knee a little bit. Would like to know what to do. She is having pain.  [phone]

## 2020-05-26 ENCOUNTER — Telehealth: Payer: Self-pay

## 2020-05-26 NOTE — Telephone Encounter (Signed)
Called [employer], the letters were generated before they received her missing information. Nothing else is needed at this time. Application is currently pending.  Called patient and advised.

## 2020-05-26 NOTE — Telephone Encounter (Signed)
Patient called stating she sent in her [employer] application for next year.  Patient states she has received 4 letters (all dated 05/01/20) from [place] stating they can't continue with the application because of missing information.   Patient states she called [employer] on 05/22/20 and spoke with someone who stated they did not need any additional information.  Patient states over the weekend she received another letter from [place] stating if they do not receive the information within 45 days her application may be withdrawn and that a copy was sent to the nurse.  Patient states she is confused if additional information is needed or if it is complete and isn't sure if "the nurse" is for Dr. Arlean Hopping office or one of their nurses.  Patient is requesting a return call.

## 2020-06-02 ENCOUNTER — Ambulatory Visit (INDEPENDENT_AMBULATORY_CARE_PROVIDER_SITE_OTHER): Payer: Medicare Other | Admitting: Physician Assistant

## 2020-06-02 ENCOUNTER — Encounter: Payer: Self-pay | Admitting: Orthopedic Surgery

## 2020-06-02 VITALS — Ht 61.0 in | Wt 160.0 lb

## 2020-06-02 DIAGNOSIS — M23203 Derangement of unspecified medial meniscus due to old tear or injury, right knee: Secondary | ICD-10-CM

## 2020-06-02 NOTE — Progress Notes (Signed)
Office Visit Note   Patient: Annette Suarez           Date of Birth: [birth year]           MRN: [id number] Visit Date: 06/02/2020              Requested by: Shirline Frees, MD [place],   [id number] PCP: Shirline Frees, MD  Chief Complaint  Patient presents with  . Right Knee - Routine Post Op    05/20/20 right knee scope and debridement       HPI: Patient is 2 weeks status post right knee arthroscopy and debridement.  Overall she is feeling well.  She does feel she is better than prior to surgery.  She does have some stiffness when she first arises out of bed or out of a chair  Assessment & Plan: Visit Diagnoses: No diagnosis found.  Plan: I did talk to her about physical therapy and she would like to defer this at this time.  I talked her about quadricep strengthening.  Also talked about doing low impact exercises such as walking.  She will follow-up for final visit in 4 weeks.  Follow-Up Instructions: No follow-ups on file.   Ortho Exam  Patient is alert, oriented, no adenopathy, well-dressed, normal affect, normal respiratory effort. Well-healed surgical portals no cellulitis no effusion swelling is overall well controlled.  Range of motion of the knee is fairly painless.  Good patellar mobility compartments are soft and nontender  Imaging: No results found. No images are attached to the encounter.  Labs: Lab Results  Component Value Date   ESRSEDRATE 32 (H) 09/08/2016   LABURIC 5.7 09/12/2017     Lab Results  Component Value Date   ALBUMIN 3.2 (L) 09/09/2017   ALBUMIN 3.9 09/08/2017   ALBUMIN 4.5 01/06/2017   LABURIC 5.7 09/12/2017    Lab Results  Component Value Date   MG 1.2 (L) 09/09/2017   No results found for: VD25OH  No results found for: PREALBUMIN CBC EXTENDED Latest Ref Rng & Units 03/14/2020 12/31/2019 09/07/2019  WBC 3.8 - 10.8 Thousand/uL 13.1(H) 9.0 9.9  RBC 3.80 - 5.10 Million/uL 4.33 4.12 4.13  HGB 11.7 -  15.5 g/dL 13.5 12.9 12.9  HCT 35 - 45 % 40.1 37.7 37.6  PLT 140 - 400 Thousand/uL 357 292 287  NEUTROABS 1,500 - 7,800 cells/uL 9,367(H) 5,652 6,425  LYMPHSABS 850 - 3,900 cells/uL 2,764 2,376 2,396     Body mass index is 30.23 kg/m.  Orders:  No orders of the defined types were placed in this encounter.  No orders of the defined types were placed in this encounter.    Procedures: No procedures performed  Clinical Data: No additional findings.  ROS:  All other systems negative, except as noted in the HPI. Review of Systems  Objective: Vital Signs: Ht 5\' 1"  (1.549 m)   Wt 160 lb (72.6 kg)   BMI 30.23 kg/m   Specialty Comments:  No specialty comments available.  PMFS History: Patient Active Problem List   Diagnosis Date Noted  . Old complex tear of medial meniscus of right knee   . Old complex tear of lateral meniscus of right knee   . Primary osteoarthritis of both feet 12/13/2017  . Dehydration 09/09/2017  . Acute lower UTI 09/09/2017  . Nausea & vomiting 09/08/2017  . Primary osteoarthritis of both hands 04/05/2017  . Left carpal tunnel syndrome 11/15/2016  . Rheumatoid arthritis of multiple  sites with negative rheumatoid factor ([place]) 10/06/2016  . High risk medication use 10/06/2016  . Dyslipidemia 10/06/2016  . Pain in both hands 09/08/2016  . Essential hypertension 09/08/2016  . Type 2 diabetes mellitus  09/08/2016  . Gastroesophageal reflux disease without esophagitis 09/08/2016  . Osteopenia of multiple sites 09/08/2016  . History of anxiety 09/08/2016  . Benign paroxysmal positional vertigo 09/08/2016  . Smoker 09/08/2016  . Sleep apnea 09/08/2016   Past Medical History:  Diagnosis Date  . Acute cystitis   . Acute cystitis    UTI  . Anxiety   . Arthritis   . Diabetes mellitus without complication ([place])   . Family history of adverse reaction to anesthesia   . GERD (gastroesophageal reflux disease)   . Goiter, nontoxic, multinodular   .  Gout   . H/O: hysterectomy   . Hx laparoscopic cholecystectomy   . Hypertension   . Incomplete bladder emptying   . Postmenopausal atrophic vaginitis   . RA (rheumatoid arthritis) ([place])   . Rectocele   . Renal calculus   . Thyroid condition   . Urolithiasis   . Vaginal enterocele     Family History  Problem Relation Age of Onset  . Diabetes Mother   . Heart disease Father 16  . Diabetes Son   . Crohn's disease Son     Past Surgical History:  Procedure Laterality Date  . CARPAL TUNNEL RELEASE  2018  . CHOLECYSTECTOMY    . GALLBLADDER SURGERY    . KNEE ARTHROSCOPY Right 05/20/2020   Procedure: RIGHT KNEE ARTHROSCOPY AND DEBRIDEMENT;  Surgeon: [name], MD;  Location: [facility];  Service: Orthopedics;  Laterality: Right;  . SQUAMOUS CELL CARCINOMA EXCISION  05/2018   Social History   Occupational History  . Not on file  Tobacco Use  . Smoking status: Former Smoker    Packs/day: 0.20    Years: 10.00    Pack years: 2.00    Types: Cigarettes    Quit date: 07/05/1978    Years since quitting: 41.9  . Smokeless tobacco: Never Used  Vaping Use  . Vaping Use: Never used  Substance and Sexual Activity  . Alcohol use: No  . Drug use: No  . Sexual activity: Not Currently

## 2020-06-03 ENCOUNTER — Other Ambulatory Visit: Payer: Self-pay

## 2020-06-03 ENCOUNTER — Encounter: Payer: Self-pay | Admitting: Orthopedic Surgery

## 2020-06-03 ENCOUNTER — Ambulatory Visit (INDEPENDENT_AMBULATORY_CARE_PROVIDER_SITE_OTHER): Payer: Medicare Other | Admitting: Orthopedic Surgery

## 2020-06-03 VITALS — BP 100/70 | HR 90 | Temp 97.7°F | Resp 20 | Ht 62.5 in | Wt 158.6 lb

## 2020-06-03 DIAGNOSIS — E042 Nontoxic multinodular goiter: Secondary | ICD-10-CM

## 2020-06-03 DIAGNOSIS — Z794 Long term (current) use of insulin: Secondary | ICD-10-CM

## 2020-06-03 DIAGNOSIS — E1169 Type 2 diabetes mellitus with other specified complication: Secondary | ICD-10-CM

## 2020-06-03 DIAGNOSIS — Z87440 Personal history of urinary (tract) infections: Secondary | ICD-10-CM

## 2020-06-03 DIAGNOSIS — G4733 Obstructive sleep apnea (adult) (pediatric): Secondary | ICD-10-CM | POA: Diagnosis not present

## 2020-06-03 DIAGNOSIS — M19042 Primary osteoarthritis, left hand: Secondary | ICD-10-CM

## 2020-06-03 DIAGNOSIS — E118 Type 2 diabetes mellitus with unspecified complications: Secondary | ICD-10-CM

## 2020-06-03 DIAGNOSIS — I10 Essential (primary) hypertension: Secondary | ICD-10-CM

## 2020-06-03 DIAGNOSIS — K219 Gastro-esophageal reflux disease without esophagitis: Secondary | ICD-10-CM

## 2020-06-03 DIAGNOSIS — Z780 Asymptomatic menopausal state: Secondary | ICD-10-CM

## 2020-06-03 DIAGNOSIS — M19041 Primary osteoarthritis, right hand: Secondary | ICD-10-CM

## 2020-06-03 DIAGNOSIS — Z0131 Encounter for examination of blood pressure with abnormal findings: Secondary | ICD-10-CM

## 2020-06-03 DIAGNOSIS — Z8659 Personal history of other mental and behavioral disorders: Secondary | ICD-10-CM

## 2020-06-03 DIAGNOSIS — E785 Hyperlipidemia, unspecified: Secondary | ICD-10-CM

## 2020-06-03 DIAGNOSIS — M0609 Rheumatoid arthritis without rheumatoid factor, multiple sites: Secondary | ICD-10-CM

## 2020-06-03 DIAGNOSIS — Z79899 Other long term (current) drug therapy: Secondary | ICD-10-CM

## 2020-06-03 DIAGNOSIS — M8580 Other specified disorders of bone density and structure, unspecified site: Secondary | ICD-10-CM

## 2020-06-03 NOTE — Progress Notes (Signed)
Careteam: Patient Care Team: Shirline Frees, MD as PCP - General  Seen by: Windell Moulding, AGNP-C  PLACE OF SERVICE:  [place] Directive information    Allergies  Allergen Reactions  . Latex Rash    No chief complaint on file.    HPI: Patient is a 79 y.o. female seen today to establish care at [facility].   Previously a patient of Dr. Azalia Bilis, [place].She chose to switch providers because he was hard to get an appointment with.  She has been married for over 57 years. Lives in a single family home with her husband and small dog. Retired Glass blower/designer.   Diabetes- Diagnosed about 12 years ago. She has numerous family members who also had it including her mother.Takes Novo log 70/30- injects 30 units twice daily with meals. In the past she took metformin, but it was stopped due to GI issues. She has also tried Alvarado, but it was discontinued due to increased UTI's. She checks her blood sugar three times daily. Claims nightly sugars are low. Brought her blood glucose journal with her today.   Recorded blood glucose:    05/31/2020- AM 377/ PM 256   06/01/2020- AM 286/ PM 313   06/02/2020- AM 207/ PM 281  Denies any recent hypoglycemic events. Claims her diet is low in carbs and sugars. Drinks one Coke zero daily. Does not have sweets in the house. Likes to eat eggs with french toast and sugar free syrup for breakfast. Likes to have sandwiches for lunch. She likes to eat salads and pasta or pizza for dinner.   Hypertension- she was diagnosed over 15 years ago. Takes irbesartan daily. Tries to avoid salt in her diet. Does not monitor her blood pressures at home. She had outpatient surgery 2 weeks ago and was told her blood pressures are different in both arms. Requesting cardiology referral. Denies blurred vision, headaches, dizziness or chest pain.   Sleep apnea- She had a sleep study about 10-15 years ago. She was told her results were  normal and did not have to use a CPAP machine. Denies excessive daytime sleepiness.   Rheumatoid arthritis with multiple sites- followed by Dr. Estanislado Pandy, last seen 08/21.Takes Enbrel weekly injections.  Ibuprofen used PRN for pain. Pain located in shoulders and hands. ROM is limited in her hands at DIP and PIP.  Started to develop bilateral shoulder pain earlier this summer. Shoulders were x-rayed and she was advised to start exercises. She never did them and claims pain has decreased . In addition, she has osteoarthritis secondary to RA. On 05/20/20 she went to the [facility] for repair of her right lateral meniscus by Dr. Sharol Given. Discharged same day. She was given hydrocodone after surgery, but it made her nauseous and she stopped taking it. Due to recent surgery she is walking with a Cane. Denies any recent falls.    Thyroid nodules-She has been told she has a goiter. In the past,  she has had yearly ultrasounds with fine needle aspiration.  Last ultrasound on 03/2019, no change in size. Left upper and lower nodules biopsied in past and benign. No further studies recommended since it has been greater than 5 years.   Frequent UTI- followed by Dr. Jeffie Pollock and [facility]. Has a history of frequent UTIs requiring antibiotics. Claims she was on Augmentin for months. She was also on Jardiance when frequent UTI's began. Jardiance stopped and so did UTI's. Denies any symptoms of  UTI at this time. Urinates 3 times a night- normal for her. Stops drinking fluids about 8 and goes to bed around 9. Denies dysuria or hematuria.   Acid reflux- diagnosed years ago. Takes omeprazole daily prior to breakfast. Does not know food triggers. Denies reflux with medication.   Anxiety- Claims she has anxiety when traveling to town. States she is scared of the traffic. Previous provider prescribed her lorazepam PRN. Uses medication about 1-2 times weekly. Denies panic attacks.   Colonoscopy- Believes she did  not have to follow up with another study. Will need to request copy from Dr. Kenton Kingfisher office.   Dexa scan- diagnosed osteopenic in 2018. Requesting to have another at [facility] with her mammogram next week. Does not take calcium or vitamin D. Walks a few times a week for exercise.   Mammogram- she gets one every year because her sister had breast cancer. Scheduled next week at [facility]. Does not perform monthly breast exams. Denies any breast tenderness, pain or lumps.   Eye exam- last noted diabetic eye exam 11/16/2016 with Susanne Greenhouse. Scheduled for December this year. She is having trouble seeing close. Uses readers.   Dental exam- She has dentures. Goes to Dr. Barney Drain every 6 months. Denies dental pain or sores.   She has received her flu vaccine this year.   Unsure if she is up to date or has received her pneumonia, shingles or tetanus vaccinations. Claims she had shingles vaccine.   Quit smoking over 40 years ago, use to smoke about 1/2 pack for 5 years. Does not drink alcohol.   She has a healthcare power of attorney- copy requested.             Review of Systems:  Review of Systems  Constitutional: Positive for diaphoresis. Negative for fever and weight loss.  HENT: Negative for congestion, hearing loss and sore throat.        Dentures  Eyes:       Uses readers  Respiratory: Negative for shortness of breath and wheezing.   Cardiovascular: Negative for chest pain and palpitations.  Gastrointestinal: Positive for constipation and heartburn. Negative for blood in stool.  Genitourinary: Positive for frequency and urgency.  Musculoskeletal: Positive for joint pain.  Skin:       Dry skin  Neurological: Negative for dizziness, weakness and headaches.  Psychiatric/Behavioral: Negative for depression. The patient is nervous/anxious. The patient does not have insomnia.     Past Medical History:  Diagnosis Date  . Acute cystitis   . Acute cystitis    UTI  .  Anxiety   . Arthritis   . Diabetes mellitus without complication ([place])   . Family history of adverse reaction to anesthesia   . GERD (gastroesophageal reflux disease)   . Goiter, nontoxic, multinodular   . Gout   . H/O: hysterectomy   . Hx laparoscopic cholecystectomy   . Hypertension   . Incomplete bladder emptying   . Postmenopausal atrophic vaginitis   . RA (rheumatoid arthritis) ([place])   . Rectocele   . Renal calculus   . Thyroid condition   . Urolithiasis   . Vaginal enterocele    Past Surgical History:  Procedure Laterality Date  . CARPAL TUNNEL RELEASE  2018  . CHOLECYSTECTOMY    . GALLBLADDER SURGERY    . KNEE ARTHROSCOPY Right 05/20/2020   Procedure: RIGHT KNEE ARTHROSCOPY AND DEBRIDEMENT;  Surgeon: [name], MD;  Location: [place] [facility];  Service: Orthopedics;  Laterality:  Right;  . SQUAMOUS CELL CARCINOMA EXCISION  05/2018   Social History:   reports that she quit smoking about 41 years ago. Her smoking use included cigarettes. She has a 2.00 pack-year smoking history. She has never used smokeless tobacco. She reports that she does not drink alcohol and does not use drugs.  Family History  Problem Relation Age of Onset  . Diabetes Mother   . Heart disease Father 45  . Diabetes Son   . Crohn's disease Son     Medications: Patient's Medications  New Prescriptions   No medications on file  Previous Medications   ASPIRIN EC 81 MG TABLET    Take 81 mg by mouth daily.   ETANERCEPT (ENBREL SURECLICK) 50 MG/ML INJECTION    Inject 50 mg into the skin once a week.   HYDROCODONE-ACETAMINOPHEN (NORCO/VICODIN) 5-325 MG TABLET    Take 1 tablet by mouth every 4 (four) hours as needed for moderate pain.   INSULIN ASPART PROTAMINE- ASPART (NOVOLOG MIX 70/30) (70-30) 100 UNIT/ML INJECTION    Inject 30 Units into the skin 2 (two) times daily with a meal.   IRBESARTAN (AVAPRO) 300 MG TABLET    Take 300 mg by mouth daily.    LORAZEPAM (ATIVAN) 0.5 MG TABLET     Take 0.5 mg by mouth at bedtime as needed for anxiety.    OMEPRAZOLE (PRILOSEC) 40 MG CAPSULE    Take 40 mg by mouth daily.    PROBIOTIC PRODUCT (PROBIOTIC PO)    Take by mouth daily.   SIMVASTATIN (ZOCOR) 20 MG TABLET    Take 20 mg by mouth daily.   Modified Medications   No medications on file  Discontinued Medications   No medications on file    Physical Exam:  There were no vitals filed for this visit. There is no height or weight on file to calculate BMI. Wt Readings from Last 3 Encounters:  06/02/20 160 lb (72.6 kg)  05/20/20 160 lb 7.9 oz (72.8 kg)  04/21/20 164 lb (74.4 kg)    Physical Exam Vitals reviewed.  Constitutional:      Appearance: Normal appearance. She is normal weight.  HENT:     Head: Normocephalic.     Nose: Nose normal.     Mouth/Throat:     Mouth: Mucous membranes are moist.     Pharynx: No posterior oropharyngeal erythema.  Eyes:     Extraocular Movements: Extraocular movements intact.     Pupils: Pupils are equal, round, and reactive to light.  Neck:     Thyroid: Thyromegaly present.   Cardiovascular:     Rate and Rhythm: Normal rate and regular rhythm.     Pulses: Normal pulses.     Heart sounds: Normal heart sounds. No murmur heard.   Pulmonary:     Effort: Pulmonary effort is normal. No respiratory distress.     Breath sounds: Normal breath sounds. No wheezing.  Abdominal:     General: Bowel sounds are normal. There is no distension.     Palpations: Abdomen is soft.     Tenderness: There is no abdominal tenderness.  Musculoskeletal:     Right shoulder: Normal range of motion.     Left shoulder: Normal range of motion.     Right hand: Swelling and bony tenderness present. Normal range of motion.     Left hand: Swelling and bony tenderness present. Normal range of motion.     Right knee: Effusion and erythema present.  Right lower leg: No edema.     Left lower leg: No edema.       Legs:  Lymphadenopathy:     Cervical: No  cervical adenopathy.  Skin:    General: Skin is warm and dry.     Capillary Refill: Capillary refill takes less than 2 seconds.  Neurological:     General: No focal deficit present.     Mental Status: She is alert and oriented to person, place, and time. Mental status is at baseline.  Psychiatric:        Mood and Affect: Mood normal.        Behavior: Behavior normal.        Thought Content: Thought content normal.        Judgment: Judgment normal.     Labs reviewed: Basic Metabolic Panel: Recent Labs    12/31/19 1156 03/14/20 1123 05/16/20 1000  NA 138 140 138  K 4.6 4.7 4.5  CL 101 100 102  CO2 28 30 27   GLUCOSE 197* 178* 236*  BUN 17 24 13   CREATININE 1.14* 1.12* 0.88  CALCIUM 9.4 9.1 9.2   Liver Function Tests: Recent Labs    09/07/19 1137 12/31/19 1156 03/14/20 1123  AST 14 11 9*  ALT 11 13 9   BILITOT 0.9 1.1 1.0  PROT 6.6 6.7 6.7   No results for input(s): LIPASE, AMYLASE in the last 8760 hours. No results for input(s): AMMONIA in the last 8760 hours. CBC: Recent Labs    09/07/19 1137 12/31/19 1156 03/14/20 1123  WBC 9.9 9.0 13.1*  NEUTROABS 6,425 5,652 9,367*  HGB 12.9 12.9 13.5  HCT 37.6 37.7 40.1  MCV 91.0 91.5 92.6  PLT 287 292 357   Lipid Panel: No results for input(s): CHOL, HDL, LDLCALC, TRIG, CHOLHDL, LDLDIRECT in the last 8760 hours. TSH: No results for input(s): TSH in the last 8760 hours. A1C: No results found for: HGBA1C   Assessment/Plan 1. Essential hypertension - ongoing, blood pressures vary >40 mm between left and right arm, will need to refer to cardiology for further work up - continue amlodipine and irbesartan regimen - continue low sodium diet - CBC with Differential/Platelets - Ambulatory referral to Cardiology  2. Type 2 diabetes mellitus  -ongoing with home blood glucose readings >250 - education done with patient about diabetic diet that limits carbs and sugars - cannot take metformin due to GI issues - history  urinary tract infections with Jardiance - continue Novolog 70/30 30 units twice daily with meals - will f/u to see if diabetic eye exam done next month - Hemoglobin A1c - Microalbumin/Creatinine Ratio, Urine - CMP  3. Gastroesophageal reflux disease without esophagitis - stable with medication - continue omeprazole regimen  4. Obstructive sleep apnea syndrome - stable, she denies daytime sleepiness - will f/u to find past sleep study  5. Rheumatoid arthritis of multiple sites with negative rheumatoid factor ([place]) - followed by Dr. Estanislado Pandy - bilateral hands with swelling at DIP and PIP, limited ROM - full ROM with shoulders - continue Enbrel regimen  6. Hyperlipidemia associated with type 2 diabetes mellitus ([place]) - stable at this time - continue statin regimen - lipid panel- future  7. History of anxiety - stable at this time, no reported panic attacks - mar consider SSRI if usage of lorazepam increases - recommend mediatation and breathing techniques  8. Primary osteoarthritis of both hands -ongoing, swelling and tenderness at DIP and PIP joints  9. High risk medication use -  stable, not taking hydrocodone at this time due to nausea - reports using lorazepam 1-2 times weekly, will discuss SSRI in future if usage increases  10. History of frequent urinary tract infections - resolved at this time, believed to be due to [place] - Followed by Dr. Jeffie Pollock  11. Osteopenia after menopause - stable, no recent fractures - start Caltrate 600-400 mg/units daily  - DG Bone Density; Future  12. Multinodular goiter - stable, US thyroid done in 2020, no further follow-up reccommended, benign > 5years - TSH  13. Encounter for examination of blood pressure with abnormal findings - blood pressures between left and right arm > 40 mm difference systolic today, patient asymptomatic - believed to have been first noticed during knee arthroscopy - ambulatory referral to  cardiology  I provided 65 minutes of face-to-face time during this encounter.    Next appt: 06/24/2020 Windell Moulding, [facility] [phone]

## 2020-06-03 NOTE — Patient Instructions (Signed)
Caltrate 600- 400 mg/units- take one daily for bone health     Osteopenia  Osteopenia is a loss of thickness (density) inside of the bones. Another name for osteopenia is low bone mass. Mild osteopenia is a normal part of aging. It is not a disease, and it does not cause symptoms. However, if you have osteopenia and continue to lose bone mass, you could develop a condition that causes the bones to become thin and break more easily (osteoporosis). You may also lose some height, have back pain, and have a stooped posture. Although osteopenia is not a disease, making changes to your lifestyle and diet can help to prevent osteopenia from developing into osteoporosis. What are the causes? Osteopenia is caused by loss of calcium in the bones.  Bones are constantly changing. Old bone cells are continually being replaced with new bone cells. This process builds new bone. The mineral calcium is needed to build new bone and maintain bone density. Bone density is usually highest around age 33. After that, most people's bodies cannot replace all the bone they have lost with new bone. What increases the risk? You are more likely to develop this condition if:  You are older than age 28.  You are a woman who went through menopause early.  You have a long illness that keeps you in bed.  You do not get enough exercise.  You lack certain nutrients (malnutrition).  You have an overactive thyroid gland (hyperthyroidism).  You smoke.  You drink a lot of alcohol.  You are taking medicines that weaken the bones, such as steroids. What are the signs or symptoms? This condition does not cause any symptoms. You may have a slightly higher risk for bone breaks (fractures), so getting fractures more easily than normal may be an indication of osteopenia. How is this diagnosed? Your health care provider can diagnose this condition with a special type of X-ray exam that measures bone density (dual-energy X-ray  absorptiometry, DEXA). This test can measure bone density in your hips, spine, and wrists. Osteopenia has no symptoms, so this condition is usually diagnosed after a routine bone density screening test is done for osteoporosis. This routine screening is usually done for:  Women who are age 1 or older.  Men who are age 5 or older. If you have risk factors for osteopenia, you may have the screening test at an earlier age. How is this treated? Making dietary and lifestyle changes can lower your risk for osteoporosis. If you have severe osteopenia that is close to becoming osteoporosis, your health care provider may prescribe medicines and dietary supplements such as calcium and vitamin D. These supplements help to rebuild bone density. Follow these instructions at home:   Take over-the-counter and prescription medicines only as told by your health care provider. These include vitamins and supplements.  Eat a diet that is high in calcium and vitamin D. ? Calcium is found in dairy products, beans, salmon, and leafy green vegetables like spinach and broccoli. ? Look for foods that have vitamin D and calcium added to them (fortified foods), such as orange juice, cereal, and bread.  Do 30 or more minutes of a weight-bearing exercise every day, such as walking, jogging, or playing a sport. These types of exercises strengthen the bones.  Take precautions at home to lower your risk of falling, such as: ? Keeping rooms well-lit and free of clutter, such as cords. ? Installing safety rails on stairs. ? Using rubber mats in the  bathroom or other areas that are often wet or slippery.  Do not use any products that contain nicotine or tobacco, such as cigarettes and e-cigarettes. If you need help quitting, ask your health care provider.  Avoid alcohol or limit alcohol intake to no more than 1 drink a day for nonpregnant women and 2 drinks a day for men. One drink equals 12 oz of beer, 5 oz of wine, or 1  oz of hard liquor.  Keep all follow-up visits as told by your health care provider. This is important. Contact a health care provider if:  You have not had a bone density screening for osteoporosis and you are: ? A woman, age 38 or older. ? A man, age 45 or older.  You are a postmenopausal woman who has not had a bone density screening for osteoporosis.  You are older than age 3 and you want to know if you should have bone density screening for osteoporosis. Summary  Osteopenia is a loss of thickness (density) inside of the bones. Another name for osteopenia is low bone mass.  Osteopenia is not a disease, but it may increase your risk for a condition that causes the bones to become thin and break more easily (osteoporosis).  You may be at risk for osteopenia if you are older than age 59 or if you are a woman who went through early menopause.  Osteopenia does not cause any symptoms, but it can be diagnosed with a bone density screening test.  Dietary and lifestyle changes are the first treatment for osteopenia. These may lower your risk for osteoporosis. This information is not intended to replace advice given to you by your health care provider. Make sure you discuss any questions you have with your health care provider. Document Revised: 06/03/2017 Document Reviewed: 03/30/2017 Elsevier Patient Education  2020 [employer].

## 2020-06-04 ENCOUNTER — Other Ambulatory Visit: Payer: Self-pay | Admitting: Orthopedic Surgery

## 2020-06-04 LAB — HEMOGLOBIN A1C
Hgb A1c MFr Bld: 8 % of total Hgb — ABNORMAL HIGH (ref <5.7)
Mean Plasma Glucose: 183 (calc)
eAG (mmol/L): 10.1 (calc)

## 2020-06-04 LAB — CBC WITH DIFFERENTIAL/PLATELET
Absolute Monocytes: 576 cells/uL (ref 200–950)
Basophils Absolute: 92 cells/uL (ref 0–200)
Basophils Relative: 0.7 %
Eosinophils Absolute: 170 cells/uL (ref 15–500)
Eosinophils Relative: 1.3 %
HCT: 39.6 % (ref 35.0–45.0)
Hemoglobin: 13.7 g/dL (ref 11.7–15.5)
Lymphs Abs: 2856 cells/uL (ref 850–3900)
MCH: 31.8 pg (ref 27.0–33.0)
MCHC: 34.6 g/dL (ref 32.0–36.0)
MCV: 91.9 fL (ref 80.0–100.0)
MPV: 10.6 fL (ref 7.5–12.5)
Monocytes Relative: 4.4 %
Neutro Abs: 9406 cells/uL — ABNORMAL HIGH (ref 1500–7800)
Neutrophils Relative %: 71.8 %
Platelets: 386 10*3/uL (ref 140–400)
RBC: 4.31 10*6/uL (ref 3.80–5.10)
RDW: 12 % (ref 11.0–15.0)
Total Lymphocyte: 21.8 %
WBC: 13.1 10*3/uL — ABNORMAL HIGH (ref 3.8–10.8)

## 2020-06-04 LAB — COMPREHENSIVE METABOLIC PANEL
AG Ratio: 1.7 (calc) (ref 1.0–2.5)
ALT: 10 U/L (ref 6–29)
AST: 12 U/L (ref 10–35)
Albumin: 4.6 g/dL (ref 3.6–5.1)
Alkaline phosphatase (APISO): 96 U/L (ref 37–153)
BUN/Creatinine Ratio: 19 (calc) (ref 6–22)
BUN: 20 mg/dL (ref 7–25)
CO2: 27 mmol/L (ref 20–32)
Calcium: 9.5 mg/dL (ref 8.6–10.4)
Chloride: 102 mmol/L (ref 98–110)
Creat: 1.06 mg/dL — ABNORMAL HIGH (ref 0.60–0.93)
Globulin: 2.7 g/dL (calc) (ref 1.9–3.7)
Glucose, Bld: 182 mg/dL — ABNORMAL HIGH (ref 65–139)
Potassium: 4.2 mmol/L (ref 3.5–5.3)
Sodium: 140 mmol/L (ref 135–146)
Total Bilirubin: 1 mg/dL (ref 0.2–1.2)
Total Protein: 7.3 g/dL (ref 6.1–8.1)

## 2020-06-04 LAB — TSH: TSH: 1.67 mIU/L (ref 0.40–4.50)

## 2020-06-06 LAB — MICROALBUMIN / CREATININE URINE RATIO
Creatinine, Urine: 88 mg/dL (ref 20–275)
Microalb Creat Ratio: 14 mcg/mg creat (ref <30)
Microalb, Ur: 1.2 mg/dL

## 2020-06-11 ENCOUNTER — Other Ambulatory Visit: Payer: Self-pay

## 2020-06-11 ENCOUNTER — Ambulatory Visit: Payer: Medicare Other | Admitting: Internal Medicine

## 2020-06-11 ENCOUNTER — Encounter: Payer: Self-pay | Admitting: Internal Medicine

## 2020-06-11 VITALS — BP 138/88 | HR 89 | Ht 61.0 in | Wt 158.0 lb

## 2020-06-11 DIAGNOSIS — I10 Essential (primary) hypertension: Secondary | ICD-10-CM | POA: Diagnosis not present

## 2020-06-11 DIAGNOSIS — E119 Type 2 diabetes mellitus without complications: Secondary | ICD-10-CM

## 2020-06-11 DIAGNOSIS — R6889 Other general symptoms and signs: Secondary | ICD-10-CM | POA: Diagnosis not present

## 2020-06-11 LAB — BASIC METABOLIC PANEL
BUN/Creatinine Ratio: 16 (ref 12–28)
BUN: 16 mg/dL (ref 8–27)
CO2: 24 mmol/L (ref 20–29)
Calcium: 9.4 mg/dL (ref 8.7–10.3)
Chloride: 101 mmol/L (ref 96–106)
Creatinine, Ser: 0.97 mg/dL (ref 0.57–1.00)
GFR calc Af Amer: 65 mL/min/{1.73_m2} (ref >59)
GFR calc non Af Amer: 57 mL/min/{1.73_m2} — ABNORMAL LOW (ref >59)
Glucose: 169 mg/dL — ABNORMAL HIGH (ref 65–99)
Potassium: 4.6 mmol/L (ref 3.5–5.2)
Sodium: 141 mmol/L (ref 134–144)

## 2020-06-11 NOTE — Progress Notes (Signed)
Cardiology Office Note:    Date:  06/11/2020   ID:  Annette Suarez, DOB 1940-10-14, MRN [id number]  PCP:  [name], NP  [place] Cardiologist:  No primary care provider on file.  [facility] Electrophysiologist:  None   CC: Blood Pressure Differential Consulted for the evaluation of blood pressure differential left and right arm at the behest of [place], Amy E, NP  History of Present Illness:    Annette Suarez is a 79 y.o. female with a hx of Diabetes and HTN, HLD, OSA not on CPAP who presents for evaluation.  Patient notes that for the last 6 months to one year, has had right arm and left arm cuff differences.  This does not change with either her wrist cuff or manual assessment.  Patient notes that despite this, she is feeling good.  Has had no chest pain, chest pressure, chest tightness, chest stinging. Patient exertion notable for able to walk, exercise, and go shopping without SOB or DOE.  Had  knee surgery ~ two weeks prior with knee pain that is improving.  No syncope or near syncope.  No fatigue, notes no palpitations or funny heart beats.     Patient reports prior cardiac testing including  echo,  stress test,  heart catheterizations,  cardioversion,  ablations.  Ambulatory BP (Wrist Cuff) Averages Reviewed Right Arm:  125-162/78-92 vs Left Arm 103/64   Past Medical History:  Diagnosis Date  . Acute cystitis    UTI  . Anxiety   . Arthritis   . Benign positional vertigo   . Dermatitis   . Diabetes mellitus without complication ([place])   . Family history of adverse reaction to anesthesia   . GERD (gastroesophageal reflux disease)   . Goiter, nontoxic, multinodular   . Gout   . H/O: hysterectomy   . History of tobacco use   . Hx laparoscopic cholecystectomy   . Hypercholesterolemia   . Hypertension   . Incomplete bladder emptying   . Insomnia   . Postmenopausal atrophic vaginitis   . RA (rheumatoid arthritis) ([place])   . Rectocele   . Renal calculus   . Sleep  apnea   . Thyroid condition   . Type 2 diabetes mellitus ([place])   . Urolithiasis   . Vaginal enterocele     Past Surgical History:  Procedure Laterality Date  . CARPAL TUNNEL RELEASE  2018  . CATARACT EXTRACTION, BILATERAL    . CHOLECYSTECTOMY    . COLONOSCOPY     07/11/2009, 02/2015  . GALLBLADDER SURGERY    . KNEE ARTHROSCOPY Right 05/20/2020   Procedure: RIGHT KNEE ARTHROSCOPY AND DEBRIDEMENT;  Surgeon: [name], MD;  Location: [facility];  Service: Orthopedics;  Laterality: Right;  . PARTIAL HYSTERECTOMY     [place] left intact  . SQUAMOUS CELL CARCINOMA EXCISION  05/2018    Current Medications: Current Meds  Medication Sig  . amLODipine (NORVASC) 2.5 MG tablet Take 2.5 mg by mouth daily. For blood pressure  . aspirin EC 81 MG tablet Take 81 mg by mouth daily.  . cephALEXin (KEFLEX) 250 MG capsule Take 250 mg by mouth at bedtime.  Marland Kitchen etanercept (ENBREL SURECLICK) 50 MG/ML injection Inject 50 mg into the skin once a week.  . fluocinonide (LIDEX) 0.05 % external solution Apply 1 application topically daily as needed.  Marland Kitchen ibuprofen (ADVIL) 200 MG tablet Take 200 mg by mouth as needed. For pain  . insulin aspart protamine- aspart (NOVOLOG MIX  70/30) (70-30) 100 UNIT/ML injection Inject 34 Units into the skin 2 (two) times daily with a meal.  . irbesartan (AVAPRO) 300 MG tablet Take 300 mg by mouth daily.   Marland Kitchen loperamide (IMODIUM A-D) 2 MG tablet Take 2 mg by mouth as needed for diarrhea or loose stools.  Marland Kitchen LORazepam (ATIVAN) 0.5 MG tablet Take 0.5 mg by mouth at bedtime as needed for anxiety.   . Omeprazole 20 MG TBDD Take 20 mg by mouth daily.  . ondansetron (ZOFRAN) 4 MG tablet Take 4 mg by mouth 3 (three) times daily as needed for nausea or vomiting.  . Probiotic Product (PROBIOTIC PO) Take by mouth daily.  . simvastatin (ZOCOR) 20 MG tablet Take 20 mg by mouth every evening.      Allergies:   Amaryl [glimepiride], Codeine, Jardiance [empagliflozin],  Pneumovax 23 [pneumococcal vac polyvalent], Wellbutrin [bupropion], and Latex   Social History   Socioeconomic History  . Marital status: Married    Spouse name: Not on file  . Number of children: Not on file  . Years of education: Not on file  . Highest education level: Not on file  Occupational History  . Not on file  Tobacco Use  . Smoking status: Former Smoker    Packs/day: 0.20    Years: 10.00    Pack years: 2.00    Types: Cigarettes    Quit date: 07/05/1978    Years since quitting: 41.9  . Smokeless tobacco: Never Used  Vaping Use  . Vaping Use: Never used  Substance and Sexual Activity  . Alcohol use: No  . Drug use: No  . Sexual activity: Not Currently  Other Topics Concern  . Not on file  Social History Narrative   Diet: Blank      Do you drink/ eat things with caffeine? Yes      Marital status:  Married                             What year were you married ? 196      Do you live in a house, apartment,assistred living, condo, trailer, etc.)? House      Is it one or more stories? 1      How many persons live in your home ? 2      Do you have any pets in your home ?(please list)  1 Sam-Dog      Highest Level of education completed: 12      Current or past profession: Office      Do you exercise?   Some                           Type & how often  Walk Daily      ADVANCED DIRECTIVES (Please bring copies)      Do you have a living will? Blank      Do you have a DNR form?  Blank                     If not, do you want to discuss one?       Do you have signed POA?HPOA forms?  Yes               If so, please bring to your appointment      FUNCTIONAL STATUS- To be completed by Spouse / child / Staff  Do you have difficulty bathing or dressing yourself ?  No      Do you have difficulty preparing food or eating ?  No      Do you have difficulty managing your mediation ?  No      Do you have difficulty managing your finances ?  No      Do you have  difficulty affording your medication ?  Yes (Some)      Social Determinants of Health   Financial Resource Strain:   . Difficulty of Paying Living Expenses: Not on file  Food Insecurity:   . Worried About Charity fundraiser in the Last Year: Not on file  . Ran Out of Food in the Last Year: Not on file  Transportation Needs:   . Lack of Transportation (Medical): Not on file  . Lack of Transportation (Non-Medical): Not on file  Physical Activity:   . Days of Exercise per Week: Not on file  . Minutes of Exercise per Session: Not on file  Stress:   . Feeling of Stress : Not on file  Social Connections:   . Frequency of Communication with Friends and Family: Not on file  . Frequency of Social Gatherings with Friends and Family: Not on file  . Attends Religious Services: Not on file  . Active Member of Clubs or Organizations: Not on file  . Attends Archivist Meetings: Not on file  . Marital Status: Not on file    Family History: The patient's family history includes Crohn's disease in her son; Diabetes in her mother and son; Heart disease (age of onset: 41) in her father. Denies family history of sudden cardiac death including drowning, car accidents, or unexplained deaths in the family. No history of aortic dissection, subclavian stenosis, or coarctation of the aorta in the family  ROS:   Please see the history of present illness.    Right knee pain and recovering from knee surgery All other systems reviewed and are negative.  EKGs/Labs/Other Studies Reviewed:    The following studies were reviewed today:  EKG:   05/16/20 SR rate 84 with non-specific T wave flattening  Recent Labs: 06/03/2020: ALT 10; BUN 20; Creat 1.06; Hemoglobin 13.7; Platelets 386; Potassium 4.2; Sodium 140; TSH 1.67  Recent Lipid Panel    Component Value Date/Time   CHOL 126 01/22/2020 0000   TRIG 164 (A) 01/22/2020 0000   HDL 44 01/22/2020 0000   LDLCALC 55 01/22/2020 0000    Risk  Assessment/Calculations:      Physical Exam:    VS:  BP 138/88 (BP Location: Right Leg, Patient Position: Prone, Cuff Size: Normal)   Pulse 89   Ht 5\' 1"  (1.549 m)   Wt 158 lb (71.7 kg)   SpO2 98%   BMI 29.85 kg/m     Blood Pressure  Left Arm 132/290 Right Arm 140/86 Right Leg 138/88  Wt Readings from Last 3 Encounters:  06/11/20 158 lb (71.7 kg)  06/03/20 158 lb 9.6 oz (71.9 kg)  06/02/20 160 lb (72.6 kg)     GEN:  Well nourished, well developed in no acute distress HEENT: Normal NECK: No JVD; No carotid bruits LYMPHATICS: No lymphadenopathy CARDIAC: RRR, no murmurs, rubs, gallops; +2 radial pulses bilaterally without subclavian bruit bilaterally RESPIRATORY:  Clear to auscultation without rales, wheezing or rhonchi  ABDOMEN: Soft, non-tender, non-distended MUSCULOSKELETAL:  No edema; No deformity  SKIN: Warm and dry NEUROLOGIC:  Alert and oriented x 3 PSYCHIATRIC:  Normal affect   ASSESSMENT:    1. Diabetes mellitus with coincident hypertension ([place])   2. Essential hypertension   3. Alteration in blood pressure    PLAN:    In order of problems listed above:  Diabetes with hypertension  Question of Subclavian Artery Stenosis Question of Coarctation of the aora - Ambulator Blood Pressure Differential at home greater than 20 mm Hg; which could be suggestive of coarctation; > 15 mm Hg which could be suggestive of of subclavian stenosis - will continue ambulatory BP monitoring; gave education on how to perform ambulatory blood pressure monitoring including the frequency and technique; goal ambulatory blood pressure < 135/85 on average - will get echocardiogram to look for Doppler signal of Coarctation (may need CT in the future) - will get CT for eval of subclavian vessels - continue home medications - discussed diet (DASH/low sodium), and exercise/weight loss interventions   Hyperlipidemia -LDL goal less than 100 -continue current statin - gave education on  dietary changes  3-4 follow up unless new symptoms or abnormal test results warranting change in plan Will bring ambulatory wrist cuff at this meeting  Would be reasonable for  APP Follow up    Shared Decision Making/Informed Consent      Patient  And husband amenable to plan  Medication Adjustments/Labs and Tests Ordered: Current medicines are reviewed at length with the patient today.  Concerns regarding medicines are outlined above.  Orders Placed This Encounter  Procedures  . CT ANGIO CHEST AORTA W/CM & OR WO/CM  . Basic Metabolic Panel (BMET)  . ECHOCARDIOGRAM COMPLETE  . VAS US CAROTID   No orders of the defined types were placed in this encounter.   Patient Instructions  Medication Instructions:   *If you need a refill on your cardiac medications before your next appointment, please call your pharmacy*   Lab Work:   If you have labs (blood work) drawn today and your tests are completely normal, you will receive your results only by: Marland Kitchen MyChart Message (if you have MyChart) OR . A paper copy in the mail If you have any lab test that is abnormal or we need to change your treatment, we will call you to review the results.   Testing/Procedures:  Your physician has requested that you have an echocardiogram. Echocardiography is a painless test that uses sound waves to create images of your heart. It provides your doctor with information about the size and shape of your heart and how well your heart's chambers and valves are working. This procedure takes approximately one hour. There are no restrictions for this procedure.   Non-Cardiac CT scanning, (CAT scanning), is a noninvasive, special x-ray that produces cross-sectional images of the body using x-rays and a computer. CT scans help physicians diagnose and treat medical conditions. For some CT exams, a contrast material is used to enhance visibility in the area of the body being studied. CT scans provide greater  clarity and reveal more details than regular x-ray exams.      Follow-Up: At [facility], you and your health needs are our priority.  As part of our continuing mission to provide you with exceptional heart care, we have created designated Provider Care Teams.  These Care Teams include your primary Cardiologist (physician) and Advanced Practice Providers (APPs -  Physician Assistants and Nurse Practitioners) who all work together to provide you with the care you need, when you need it.  We recommend signing up for the patient portal called "  MyChart".  Sign up information is provided on this After Visit Summary.  MyChart is used to connect with patients for Virtual Visits (Telemedicine).  Patients are able to view lab/test results, encounter notes, upcoming appointments, etc.  Non-urgent messages can be sent to your provider as well.   To learn more about what you can do with MyChart, go to [email or web address].    Your next appointment:   3 month(s)  The format for your next appointment:   In Person  Provider:   Rudean Haskell, MD   Other Instructions      Signed, Werner Lean, MD  06/11/2020 10:55 AM    [place]

## 2020-06-11 NOTE — Patient Instructions (Addendum)
Medication Instructions:   *If you need a refill on your cardiac medications before your next appointment, please call your pharmacy*   Lab Work:   If you have labs (blood work) drawn today and your tests are completely normal, you will receive your results only by: Marland Kitchen MyChart Message (if you have MyChart) OR . A paper copy in the mail If you have any lab test that is abnormal or we need to change your treatment, we will call you to review the results.   Testing/Procedures:  Your physician has requested that you have an echocardiogram. Echocardiography is a painless test that uses sound waves to create images of your heart. It provides your doctor with information about the size and shape of your heart and how well your heart's chambers and valves are working. This procedure takes approximately one hour. There are no restrictions for this procedure.   Non-Cardiac CT scanning, (CAT scanning), is a noninvasive, special x-ray that produces cross-sectional images of the body using x-rays and a computer. CT scans help physicians diagnose and treat medical conditions. For some CT exams, a contrast material is used to enhance visibility in the area of the body being studied. CT scans provide greater clarity and reveal more details than regular x-ray exams.      Follow-Up: At [facility], you and your health needs are our priority.  As part of our continuing mission to provide you with exceptional heart care, we have created designated Provider Care Teams.  These Care Teams include your primary Cardiologist (physician) and Advanced Practice Providers (APPs -  Physician Assistants and Nurse Practitioners) who all work together to provide you with the care you need, when you need it.  We recommend signing up for the patient portal called "MyChart".  Sign up information is provided on this After Visit Summary.  MyChart is used to connect with patients for Virtual Visits (Telemedicine).  Patients  are able to view lab/test results, encounter notes, upcoming appointments, etc.  Non-urgent messages can be sent to your provider as well.   To learn more about what you can do with MyChart, go to [email or web address].    Your next appointment:   3 month(s)  The format for your next appointment:   In Person  Provider:   Rudean Haskell, MD   Other Instructions

## 2020-06-23 ENCOUNTER — Ambulatory Visit: Payer: Medicare Other | Admitting: Physician Assistant

## 2020-06-24 ENCOUNTER — Encounter: Payer: Self-pay | Admitting: Orthopedic Surgery

## 2020-06-24 ENCOUNTER — Ambulatory Visit (INDEPENDENT_AMBULATORY_CARE_PROVIDER_SITE_OTHER): Payer: Medicare Other | Admitting: Orthopedic Surgery

## 2020-06-24 ENCOUNTER — Other Ambulatory Visit: Payer: Self-pay

## 2020-06-24 VITALS — BP 140/92 | HR 98 | Temp 97.7°F

## 2020-06-24 DIAGNOSIS — I10 Essential (primary) hypertension: Secondary | ICD-10-CM | POA: Diagnosis not present

## 2020-06-24 DIAGNOSIS — Z794 Long term (current) use of insulin: Secondary | ICD-10-CM

## 2020-06-24 DIAGNOSIS — E118 Type 2 diabetes mellitus with unspecified complications: Secondary | ICD-10-CM | POA: Diagnosis not present

## 2020-06-24 DIAGNOSIS — E1169 Type 2 diabetes mellitus with other specified complication: Secondary | ICD-10-CM

## 2020-06-24 DIAGNOSIS — K219 Gastro-esophageal reflux disease without esophagitis: Secondary | ICD-10-CM | POA: Diagnosis not present

## 2020-06-24 DIAGNOSIS — R6889 Other general symptoms and signs: Secondary | ICD-10-CM

## 2020-06-24 DIAGNOSIS — M0609 Rheumatoid arthritis without rheumatoid factor, multiple sites: Secondary | ICD-10-CM

## 2020-06-24 DIAGNOSIS — Z8659 Personal history of other mental and behavioral disorders: Secondary | ICD-10-CM

## 2020-06-24 DIAGNOSIS — E785 Hyperlipidemia, unspecified: Secondary | ICD-10-CM

## 2020-06-24 MED ORDER — IRBESARTAN 300 MG PO TABS
300.0000 mg | ORAL_TABLET | Freq: Every day | ORAL | 1 refills | Status: DC
Start: 2020-06-24 — End: 2020-12-04

## 2020-06-24 MED ORDER — SIMVASTATIN 20 MG PO TABS
20.0000 mg | ORAL_TABLET | Freq: Every evening | ORAL | 1 refills | Status: DC
Start: 2020-06-24 — End: 2021-01-19

## 2020-06-24 MED ORDER — FREESTYLE LIBRE 14 DAY READER DEVI: 14.0000 [IU] | Freq: Two times a day (BID) | 1 refills | Status: DC

## 2020-06-24 MED ORDER — FREESTYLE LIBRE 14 DAY SENSOR MISC: 14.0000 [IU] | Freq: Two times a day (BID) | 1 refills | Status: DC

## 2020-06-24 MED ORDER — AMLODIPINE BESYLATE 2.5 MG PO TABS
2.5000 mg | ORAL_TABLET | Freq: Every day | ORAL | 1 refills | Status: DC
Start: 2020-06-24 — End: 2020-07-14

## 2020-06-24 MED ORDER — LORAZEPAM 0.5 MG PO TABS: 0.5000 mg | ORAL_TABLET | Freq: Every evening | ORAL | 0 refills | Status: DC | PRN

## 2020-06-24 NOTE — Patient Instructions (Addendum)
Please take 70/30 insulin 15-30 minutes after you eat. If you only eat 50% of dinner, cut insulin in half (17 units).       Hypoglycemia Hypoglycemia is when the sugar (glucose) level in your blood is too low. Signs of low blood sugar may include:  Feeling: ? Hungry. ? Worried or nervous (anxious). ? Sweaty and clammy. ? Confused. ? Dizzy. ? Sleepy. ? Sick to your stomach (nauseous).  Having: ? A fast heartbeat. ? A headache. ? A change in your vision. ? Tingling or no feeling (numbness) around your mouth, lips, or tongue. ? Jerky movements that you cannot control (seizure).  Having trouble with: ? Moving (coordination). ? Sleeping. ? Passing out (fainting). ? Getting upset easily (irritability). Low blood sugar can happen to people who have diabetes and people who do not have diabetes. Low blood sugar can happen quickly, and it can be an emergency. Treating low blood sugar Low blood sugar is often treated by eating or drinking something sugary right away, such as:  Fruit juice, 4-6 oz (120-150 mL).  Regular soda (not diet soda), 4-6 oz (120-150 mL).  Low-fat milk, 4 oz (120 mL).  Several pieces of hard candy.  Sugar or honey, 1 Tbsp (15 mL). Treating low blood sugar if you have diabetes If you can think clearly and swallow safely, follow the 15:15 rule:  Take 15 grams of a fast-acting carb (carbohydrate). Talk with your doctor about how much you should take.  Always keep a source of fast-acting carb with you, such as: ? Sugar tablets (glucose pills). Take 3-4 pills. ? 6-8 pieces of hard candy. ? 4-6 oz (120-150 mL) of fruit juice. ? 4-6 oz (120-150 mL) of regular (not diet) soda. ? 1 Tbsp (15 mL) honey or sugar.  Check your blood sugar 15 minutes after you take the carb.  If your blood sugar is still at or below 70 mg/dL (3.9 mmol/L), take 15 grams of a carb again.  If your blood sugar does not go above 70 mg/dL (3.9 mmol/L) after 3 tries, get help right  away.  After your blood sugar goes back to normal, eat a meal or a snack within 1 hour.  Treating very low blood sugar If your blood sugar is at or below 54 mg/dL (3 mmol/L), you have very low blood sugar (severe hypoglycemia). This may also cause:  Passing out.  Jerky movements you cannot control (seizure).  Losing consciousness (coma). This is an emergency. Do not wait to see if the symptoms will go away. Get medical help right away. Call your local emergency services (911 in the [place] Do not drive yourself to the hospital. If you have very low blood sugar and you cannot eat or drink, you may need a glucagon shot (injection). A family member or friend should learn how to check your blood sugar and how to give you a glucagon shot. Ask your doctor if you need to have a glucagon shot kit at home. Follow these instructions at home: General instructions  Take over-the-counter and prescription medicines only as told by your doctor.  Stay aware of your blood sugar as told by your doctor.  Limit alcohol intake to no more than 1 drink a day for nonpregnant women and 2 drinks a day for men. One drink equals 12 oz of beer (355 mL), 5 oz of wine (148 mL), or 1 oz of hard liquor (44 mL).  Keep all follow-up visits as told by your doctor. This is  important. If you have diabetes:   Follow your diabetes care plan as told by your doctor. Make sure you: ? Know the signs of low blood sugar. ? Take your medicines as told. ? Follow your exercise and meal plan. ? Eat on time. Do not skip meals. ? Check your blood sugar as often as told by your doctor. Always check it before and after exercise. ? Follow your sick day plan when you cannot eat or drink normally. Make this plan ahead of time with your doctor.  Share your diabetes care plan with: ? Your work or school. ? People you live with.  Check your pee (urine) for ketones: ? When you are sick. ? As told by your doctor.  Carry a card or wear  jewelry that says you have diabetes. Contact a doctor if:  You have trouble keeping your blood sugar in your target range.  You have low blood sugar often. Get help right away if:  You still have symptoms after you eat or drink something sugary.  Your blood sugar is at or below 54 mg/dL (3 mmol/L).  You have jerky movements that you cannot control.  You pass out. These symptoms may be an emergency. Do not wait to see if the symptoms will go away. Get medical help right away. Call your local emergency services (911 in the [place] Do not drive yourself to the hospital. Summary  Hypoglycemia happens when the level of sugar (glucose) in your blood is too low.  Low blood sugar can happen to people who have diabetes and people who do not have diabetes. Low blood sugar can happen quickly, and it can be an emergency.  Make sure you know the signs of low blood sugar and know how to treat it.  Always keep a source of sugar (fast-acting carb) with you to treat low blood sugar. This information is not intended to replace advice given to you by your health care provider. Make sure you discuss any questions you have with your health care provider. Document Revised: 10/12/2018 Document Reviewed: 07/25/2015 Elsevier Patient Education  2020 [employer].

## 2020-06-24 NOTE — Progress Notes (Signed)
Careteam: Patient Care Team: Annette Heir, NP as PCP - General (Adult Health Nurse Practitioner)  Seen by: Hazle Nordmann, AGNP-C  PLACE OF SERVICE:  [facility]  Advanced Directive information Does Patient Have a Medical Advance Directive?: Yes, Does patient want to make changes to medical advance directive?: Yes (ED - Information included in AVS)  Allergies  Allergen Reactions  . Amaryl [Glimepiride]     Don't Remember  . Codeine     SICK  . Jardiance [Empagliflozin]     Urinary tract infection- SIDE EFFECT  . Pneumovax 23 [Pneumococcal Vac Polyvalent]     Red / Swelling / Hot to touch SIDE EFFECTS  . Wellbutrin [Bupropion]     Unknown  . Latex Rash    Chief Complaint  Patient presents with  . Annual Exam    Yearly physical   . Health Maintenance    Discuss need for eye exam      HPI: Patient is a 79 y.o. female seen today for follow up on blood sugars.   MMSE 29/30 today.   She is still taking 34 units of 70/30 daily. Checking her sugars several times daily. Brought her blood glucose journal with her today.Reports her blood sugar dropped to the 40's twice since last seen. States she feels hot and sweaty when her sugar is in the 40's. Noticed her blood sugars dropped into 40's when she skipped dinner night before but still gave herself insulin. Drank orange juice to elevate blood glucose in both events. States she has glucose tabs at home. Does not like checking her sugars so often, asking for [employer].   Claims 1 week ago she woke up very dizzy and nauseous. She felt like the room was spinning around her. She had her hair done the day before and got a lot of water in her ear. When she was dizzy the room was spinning around her. She slept in bed the rest of the day and felt better after incident. She has not had another episode since. No falls or injuries occurred. Also measured her blood sugar and it was normal.   Plans to see Dr. Corliss Skains in  the next few weeks. Still taking Embrel. Denies any signs of infection. Still having right knee pain. She thinks her overall pain has improved. Takes ibuprofen 2-3 times a week. Also using OTC voltaren gel on her right shoulder.   Recently seen by eye doctor. Told she has astigmatism. New glasses have been ordered.   Having trouble sleeping. Does not go to bed the same time every night. Taking ativan to help her sleep. Asking for additional interventions to help with sleep.   Seen by Dr. Izora Ribas, cardiology on 12/8. He has since ordered CT chest and aorta to evaluate right subclavian artery stenosis versus coarctation. Echocardiogram also ordered. She denies chest pain, sob, dizziness occurred once.     Review of Systems:  Review of Systems  Constitutional: Negative for chills, fever and malaise/fatigue.  HENT: Positive for hearing loss. Negative for sore throat.   Eyes:       Glasses  Respiratory: Negative for cough, shortness of breath and wheezing.   Cardiovascular: Negative for chest pain and leg swelling.  Gastrointestinal: Negative for constipation and heartburn.  Genitourinary: Negative for dysuria and hematuria.  Musculoskeletal: Positive for joint pain and myalgias.  Skin:       Dry skin  Neurological: Positive for dizziness. Negative for weakness and headaches.  Psychiatric/Behavioral: Negative  for depression. The patient is nervous/anxious.     Past Medical History:  Diagnosis Date  . Acute cystitis    UTI  . Anxiety   . Arthritis   . Benign positional vertigo   . Dermatitis   . Diabetes mellitus without complication ([place])   . Family history of adverse reaction to anesthesia   . GERD (gastroesophageal reflux disease)   . Goiter, nontoxic, multinodular   . Gout   . H/O: hysterectomy   . History of tobacco use   . Hx laparoscopic cholecystectomy   . Hypercholesterolemia   . Hypertension   . Incomplete bladder emptying   . Insomnia   . Postmenopausal  atrophic vaginitis   . RA (rheumatoid arthritis) ([place])   . Rectocele   . Renal calculus   . Sleep apnea   . Thyroid condition   . Type 2 diabetes mellitus ([place])   . Urolithiasis   . Vaginal enterocele    Past Surgical History:  Procedure Laterality Date  . CARPAL TUNNEL RELEASE  2018  . CATARACT EXTRACTION, BILATERAL    . CHOLECYSTECTOMY    . COLONOSCOPY     07/11/2009, 02/2015  . GALLBLADDER SURGERY    . KNEE ARTHROSCOPY Right 05/20/2020   Procedure: RIGHT KNEE ARTHROSCOPY AND DEBRIDEMENT;  Surgeon: [name], MD;  Location: [facility];  Service: Orthopedics;  Laterality: Right;  . PARTIAL HYSTERECTOMY     Overies left intact  . SQUAMOUS CELL CARCINOMA EXCISION  05/2018   Social History:   reports that she quit smoking about 42 years ago. Her smoking use included cigarettes. She has a 2.00 pack-year smoking history. She has never used smokeless tobacco. She reports that she does not drink alcohol and does not use drugs.  Family History  Problem Relation Age of Onset  . Diabetes Mother   . Heart disease Father 93  . Diabetes Son   . Crohn's disease Son     Medications: Patient's Medications  New Prescriptions   No medications on file  Previous Medications   AMLODIPINE (NORVASC) 2.5 MG TABLET    Take 2.5 mg by mouth daily. For blood pressure   ASPIRIN EC 81 MG TABLET    Take 81 mg by mouth daily.   ETANERCEPT (ENBREL SURECLICK) 50 MG/ML INJECTION    Inject 50 mg into the skin once a week.   FLUOCINONIDE (LIDEX) 0.05 % EXTERNAL SOLUTION    Apply 1 application topically daily as needed.   IBUPROFEN (ADVIL) 200 MG TABLET    Take 200 mg by mouth as needed. For pain   INSULIN ASPART PROTAMINE- ASPART (NOVOLOG MIX 70/30) (70-30) 100 UNIT/ML INJECTION    Inject 34 Units into the skin 2 (two) times daily with a meal.   IRBESARTAN (AVAPRO) 300 MG TABLET    Take 300 mg by mouth daily.    LOPERAMIDE (IMODIUM A-D) 2 MG TABLET    Take 2 mg by mouth as needed for  diarrhea or loose stools.   LORAZEPAM (ATIVAN) 0.5 MG TABLET    Take 0.5 mg by mouth at bedtime as needed for anxiety.    OMEPRAZOLE 20 MG TBDD    Take 20 mg by mouth daily.   ONDANSETRON (ZOFRAN) 4 MG TABLET    Take 4 mg by mouth 3 (three) times daily as needed for nausea or vomiting.   PROBIOTIC PRODUCT (PROBIOTIC PO)    Take by mouth daily.   SIMVASTATIN (ZOCOR) 20 MG TABLET    Take  20 mg by mouth every evening.   Modified Medications   No medications on file  Discontinued Medications   CEPHALEXIN (KEFLEX) 250 MG CAPSULE    Take 250 mg by mouth at bedtime.    Physical Exam:  Vitals:   06/24/20 1310  BP: (!) 140/92  Pulse: 98  Temp: 97.7 F (36.5 C)  SpO2: 92%   There is no height or weight on file to calculate BMI. Wt Readings from Last 3 Encounters:  06/11/20 158 lb (71.7 kg)  06/03/20 158 lb 9.6 oz (71.9 kg)  06/02/20 160 lb (72.6 kg)    Physical Exam Vitals reviewed.  Constitutional:      Appearance: Normal appearance.  HENT:     Head: Normocephalic.     Mouth/Throat:     Mouth: Mucous membranes are moist.  Cardiovascular:     Rate and Rhythm: Normal rate and regular rhythm.     Pulses: Normal pulses.     Heart sounds: Normal heart sounds. No murmur heard.   Pulmonary:     Effort: Pulmonary effort is normal. No respiratory distress.     Breath sounds: Normal breath sounds. No wheezing.  Abdominal:     General: Bowel sounds are normal. There is no distension.     Palpations: Abdomen is soft.     Tenderness: There is no abdominal tenderness.  Musculoskeletal:     Cervical back: No tenderness.     Right lower leg: No edema.     Left lower leg: No edema.  Lymphadenopathy:     Cervical: No cervical adenopathy.  Skin:    General: Skin is warm and dry.     Capillary Refill: Capillary refill takes less than 2 seconds.  Neurological:     General: No focal deficit present.     Mental Status: She is alert and oriented to person, place, and time.     Motor: No  weakness.     Gait: Gait normal.     Comments: MMSE 29/30  Psychiatric:        Mood and Affect: Mood normal.        Behavior: Behavior normal.        Thought Content: Thought content normal.        Judgment: Judgment normal.     Labs reviewed: Basic Metabolic Panel: Recent Labs    07/25/19 0000 09/07/19 1137 01/22/20 0000 03/14/20 1123 05/16/20 1000 06/03/20 0000 06/11/20 1036  NA 138   < >  --    < > 138 140 141  K 4.0   < >  --    < > 4.5 4.2 4.6  CL 102   < >  --    < > 102 102 101  CO2 27*   < >  --    < > 27 27 24   GLUCOSE  --    < >  --    < > 236* 182* 169*  BUN 15   < >  --    < > 13 20 16   CREATININE 0.9   < >  --    < > 0.88 1.06* 0.97  CALCIUM 9.2   < >  --    < > 9.2 9.5 9.4  TSH 1.39  --  2.25  --   --  1.67  --    < > = values in this interval not displayed.   Liver Function Tests: Recent Labs    07/25/19 0000 09/07/19 1137 12/31/19 1156  03/14/20 1123 06/03/20 0000  AST 18   < > 11 9* 12  ALT 17   < > 13 9 10   ALKPHOS 83  --   --   --   --   BILITOT  --    < > 1.1 1.0 1.0  PROT  --    < > 6.7 6.7 7.3  ALBUMIN 4.6  --   --   --   --    < > = values in this interval not displayed.   No results for input(s): LIPASE, AMYLASE in the last 8760 hours. No results for input(s): AMMONIA in the last 8760 hours. CBC: Recent Labs    12/31/19 1156 03/14/20 1123 06/03/20 0000  WBC 9.0 13.1* 13.1*  NEUTROABS 5,652 9,367* 9,406*  HGB 12.9 13.5 13.7  HCT 37.7 40.1 39.6  MCV 91.5 92.6 91.9  PLT 292 357 386   Lipid Panel: Recent Labs    01/22/20 0000  CHOL 126  HDL 44  LDLCALC 55  TRIG 164*   TSH: Recent Labs    07/25/19 0000 01/22/20 0000 06/03/20 0000  TSH 1.39 2.25 1.67   A1C: Lab Results  Component Value Date   HGBA1C 8.0 (H) 06/03/2020     Assessment/Plan 1. Type 2 diabetes mellitus  - 2 reported incidents of hypoglycemia, suspect both events were caused by skipping dinner - advise patient to give 34 units of 70/30 is she eats  >50 percent of meal - advise patient to give 17 units of 70/30 if she eats < 50 percent of meal - continue orange juice or glucose tabs for low blood sugar - last A1C- 8.0 - Continuous Blood Gluc Sensor (FREESTYLE LIBRE 14 DAY SENSOR) MISC; 14 Units by Does not apply route in the morning and at bedtime.  Dispense: 1 each; Refill: 1 - Continuous Blood Gluc Receiver (FREESTYLE LIBRE 14 DAY READER) DEVI; 14 Units by Does not apply route in the morning and at bedtime.  Dispense: 1 each; Refill: 1 - Continuous Blood Gluc Receiver (FREESTYLE LIBRE 14 DAY READER) DEVI; 14 Units by Does not apply route in the morning and at bedtime.  Dispense: 1 each; Refill: 1  2. Essential hypertension - stable with medication, continue amlodipine and irbesartan  3. Gastroesophageal reflux disease without esophagitis - stable with medication - continue omeprazole daily  4. Hyperlipidemia associated with type 2 diabetes mellitus ([place]) - stable with statin, continue simvastatin 20 mg daily  5. History of anxiety - no recent panic attacks - advised patient to create a sleep schedule and got to bed same time everyday - continue lorazepam 0.5 mg QHS prn for anxiety  6. Rheumatoid arthritis of multiple sites with negative rheumatoid factor (HCC) - stable with Enbrel and prn ibuprofen - continue f/u with Dr. Corena Pilgrim  7. Alternation in blood pressure - recently seen by cardiology - CT chest and aorta ordered to r/o subclavian aorta stenosis versus coarctation - she remains asymptomatic at this time  Next appt: 08/26/2020 Niel Hummer  [facility] [phone]

## 2020-06-30 ENCOUNTER — Encounter: Payer: Self-pay | Admitting: Physician Assistant

## 2020-06-30 ENCOUNTER — Ambulatory Visit (INDEPENDENT_AMBULATORY_CARE_PROVIDER_SITE_OTHER): Payer: Medicare Other | Admitting: Physician Assistant

## 2020-06-30 VITALS — Ht 61.0 in | Wt 158.0 lb

## 2020-06-30 DIAGNOSIS — M23203 Derangement of unspecified medial meniscus due to old tear or injury, right knee: Secondary | ICD-10-CM

## 2020-06-30 NOTE — Progress Notes (Signed)
Office Visit Note   Patient: Annette Suarez           Date of Birth: 03/20/1941           MRN: [id number] Visit Date: 06/30/2020              Requested by: [name], NP [phone],  [place] [address] PCP: [name], NP  Chief Complaint  Patient presents with  . Right Knee - Follow-up    Right knee arthroscopy 05/20/2020      HPI: This is a pleasant 79 year old woman who is status post right knee arthroscopy approximately 6 weeks ago.  While she does feel better than prior to the surgery she has not yet feels she is as active as she was prior to the injury  Assessment & Plan: Visit Diagnoses: No diagnosis found.  Plan: She will use her Voltaren gel.  She understands that some of this is due to arthritis and inflammation.  We will follow up with a recheck with Dr. Sharol Given in 6 weeks  Follow-Up Instructions: No follow-ups on file.   Ortho Exam  Patient is alert, oriented, no adenopathy, well-dressed, normal affect, normal respiratory effort.  Right knee well-healed surgical portals no swelling no effusion good range of motion   Imaging: No results found. No images are attached to the encounter.  Labs: Lab Results  Component Value Date   HGBA1C 8.0 (H) 06/03/2020   HGBA1C 7.5 01/22/2020   HGBA1C 8.1 11/26/2019   ESRSEDRATE 32 (H) 09/08/2016   LABURIC 5.7 09/12/2017     Lab Results  Component Value Date   ALBUMIN 4.6 07/25/2019   ALBUMIN 3.2 (L) 09/09/2017   ALBUMIN 3.9 09/08/2017   LABURIC 5.7 09/12/2017    Lab Results  Component Value Date   MG 1.2 (L) 09/09/2017   No results found for: VD25OH  No results found for: PREALBUMIN CBC EXTENDED Latest Ref Rng & Units 06/03/2020 03/14/2020 12/31/2019  WBC 3.8 - 10.8 Thousand/uL 13.1(H) 13.1(H) 9.0  RBC 3.80 - 5.10 Million/uL 4.31 4.33 4.12  HGB 11.7 - 15.5 g/dL 13.7 13.5 12.9  HCT 35.0 - 45.0 % 39.6 40.1 37.7  PLT 140 - 400 Thousand/uL 386 357 292  NEUTROABS 1,500 - 7,800 cells/uL 9,406(H)  9,367(H) 5,652  LYMPHSABS 850 - 3,900 cells/uL 2,856 2,764 2,376     Body mass index is 29.85 kg/m.  Orders:  No orders of the defined types were placed in this encounter.  No orders of the defined types were placed in this encounter.    Procedures: No procedures performed  Clinical Data: No additional findings.  ROS:  All other systems negative, except as noted in the HPI. Review of Systems  Objective: Vital Signs: Ht 5\' 1"  (1.549 m)   Wt 158 lb (71.7 kg)   BMI 29.85 kg/m   Specialty Comments:  No specialty comments available.  PMFS History: Patient Active Problem List   Diagnosis Date Noted  . Alteration in blood pressure 06/11/2020  . Old complex tear of medial meniscus of right knee   . Old complex tear of lateral meniscus of right knee   . Primary osteoarthritis of both feet 12/13/2017  . Dehydration 09/09/2017  . Acute lower UTI 09/09/2017  . Nausea & vomiting 09/08/2017  . Primary osteoarthritis of both hands 04/05/2017  . Left carpal tunnel syndrome 11/15/2016  . Rheumatoid arthritis of multiple sites with negative rheumatoid factor ([place]) 10/06/2016  . High risk medication use  10/06/2016  . Dyslipidemia 10/06/2016  . Pain in both hands 09/08/2016  . Diabetes mellitus with coincident hypertension (HCC) 09/08/2016  . Type 2 diabetes mellitus  09/08/2016  . Gastroesophageal reflux disease without esophagitis 09/08/2016  . Osteopenia of multiple sites 09/08/2016  . History of anxiety 09/08/2016  . Benign paroxysmal positional vertigo 09/08/2016  . Smoker 09/08/2016  . Sleep apnea 09/08/2016   Past Medical History:  Diagnosis Date  . Acute cystitis    UTI  . Anxiety   . Arthritis   . Benign positional vertigo   . Dermatitis   . Diabetes mellitus without complication (HCC)   . Family history of adverse reaction to anesthesia   . GERD (gastroesophageal reflux disease)   . Goiter, nontoxic, multinodular   . Gout   . H/O: hysterectomy   .  History of tobacco use   . Hx laparoscopic cholecystectomy   . Hypercholesterolemia   . Hypertension   . Incomplete bladder emptying   . Insomnia   . Postmenopausal atrophic vaginitis   . RA (rheumatoid arthritis) (HCC)   . Rectocele   . Renal calculus   . Sleep apnea   . Thyroid condition   . Type 2 diabetes mellitus (HCC)   . Urolithiasis   . Vaginal enterocele     Family History  Problem Relation Age of Onset  . Diabetes Mother   . Heart disease Father 60  . Diabetes Son   . Crohn's disease Son     Past Surgical History:  Procedure Laterality Date  . CARPAL TUNNEL RELEASE  2018  . CATARACT EXTRACTION, BILATERAL    . CHOLECYSTECTOMY    . COLONOSCOPY     07/11/2009, 02/2015  . GALLBLADDER SURGERY    . KNEE ARTHROSCOPY Right 05/20/2020   Procedure: RIGHT KNEE ARTHROSCOPY AND DEBRIDEMENT;  Surgeon: Nadara Mustard, MD;  Location: [facility];  Service: Orthopedics;  Laterality: Right;  . PARTIAL HYSTERECTOMY     Overies left intact  . SQUAMOUS CELL CARCINOMA EXCISION  05/2018   Social History   Occupational History  . Not on file  Tobacco Use  . Smoking status: Former Smoker    Packs/day: 0.20    Years: 10.00    Pack years: 2.00    Types: Cigarettes    Quit date: 07/05/1978    Years since quitting: 42.0  . Smokeless tobacco: Never Used  Vaping Use  . Vaping Use: Never used  Substance and Sexual Activity  . Alcohol use: No  . Drug use: No  . Sexual activity: Not Currently

## 2020-07-02 NOTE — Progress Notes (Signed)
Office Visit Note  Patient: Annette Suarez             Date of Birth: [birth year]           MRN: [id number]             PCP: [name], NP Referring: Shirline Frees, MD Visit Date: 07/14/2020 Occupation: @GUAROCC @  Subjective:  Medication management.   History of Present Illness: Annette Suarez is a 79 y.o. female history of rheumatoid arthritis and osteoarthritis.  She states she underwent right meniscal tear repair on May 21, 2020 by Dr. Sharol Given.  She has gradual recovery.  She is still using a cane.  She has been icing her right knee joint.  She moved to chair couple of weeks ago and she was having some discomfort in her right shoulder joint which is gradually improving.  She stopped Enbrel at the time of the knee surgery.  She denies having a flare.  She has been taking Enbrel now on a regular basis.  She complains of decreased grip and strength.  Activities of Daily Living:  Patient reports morning stiffness for 30-60 minutes.   Patient Denies nocturnal pain.  Difficulty dressing/grooming: Denies Difficulty climbing stairs: Denies Difficulty getting out of chair: Denies Difficulty using hands for taps, buttons, cutlery, and/or writing: Reports  Review of Systems  Constitutional: Positive for fatigue.  HENT: Positive for mouth dryness. Negative for mouth sores and nose dryness.   Eyes: Positive for dryness. Negative for pain, itching and visual disturbance.  Respiratory: Negative for cough, hemoptysis, shortness of breath and difficulty breathing.   Cardiovascular: Negative for chest pain, palpitations and swelling in legs/feet.  Gastrointestinal: Negative for abdominal pain, blood in stool, constipation and diarrhea.  Endocrine: Negative for increased urination.  Genitourinary: Negative for painful urination.  Musculoskeletal: Positive for arthralgias, joint pain, myalgias, morning stiffness, muscle tenderness and myalgias. Negative for joint swelling and muscle weakness.   Skin: Negative for color change, rash and redness.  Allergic/Immunologic: Negative for susceptible to infections.  Neurological: Positive for dizziness. Negative for numbness, headaches, memory loss and weakness.  Hematological: Negative for swollen glands.  Psychiatric/Behavioral: Negative for confusion and sleep disturbance.    PMFS History:  Patient Active Problem List   Diagnosis Date Noted  . Alteration in blood pressure 06/11/2020  . Old complex tear of medial meniscus of right knee   . Old complex tear of lateral meniscus of right knee   . Primary osteoarthritis of both feet 12/13/2017  . Dehydration 09/09/2017  . Acute lower UTI 09/09/2017  . Nausea & vomiting 09/08/2017  . Primary osteoarthritis of both hands 04/05/2017  . Left carpal tunnel syndrome 11/15/2016  . Rheumatoid arthritis of multiple sites with negative rheumatoid factor ([place]) 10/06/2016  . High risk medication use 10/06/2016  . Dyslipidemia 10/06/2016  . Pain in both hands 09/08/2016  . Diabetes mellitus with coincident hypertension ([place]) 09/08/2016  . Type 2 diabetes mellitus  09/08/2016  . Gastroesophageal reflux disease without esophagitis 09/08/2016  . Osteopenia of multiple sites 09/08/2016  . History of anxiety 09/08/2016  . Benign paroxysmal positional vertigo 09/08/2016  . Smoker 09/08/2016  . Sleep apnea 09/08/2016    Past Medical History:  Diagnosis Date  . Acute cystitis    UTI  . Anxiety   . Arthritis   . Benign positional vertigo   . Dermatitis   . Diabetes mellitus without complication ([place])   . Family history of adverse reaction to anesthesia   .  GERD (gastroesophageal reflux disease)   . Goiter, nontoxic, multinodular   . Gout   . H/O: hysterectomy   . History of tobacco use   . Hx laparoscopic cholecystectomy   . Hypercholesterolemia   . Hypertension   . Incomplete bladder emptying   . Insomnia   . Postmenopausal atrophic vaginitis   . RA (rheumatoid arthritis) ([place])   .  Rectocele   . Renal calculus   . Sleep apnea   . Thyroid condition   . Type 2 diabetes mellitus ([place])   . Urolithiasis   . Vaginal enterocele     Family History  Problem Relation Age of Onset  . Diabetes Mother   . Heart disease Father 65  . Diabetes Son   . Crohn's disease Son    Past Surgical History:  Procedure Laterality Date  . CARPAL TUNNEL RELEASE  2018  . CATARACT EXTRACTION, BILATERAL    . CHOLECYSTECTOMY    . COLONOSCOPY     07/11/2009, 02/2015  . GALLBLADDER SURGERY    . KNEE ARTHROSCOPY Right 05/20/2020   Procedure: RIGHT KNEE ARTHROSCOPY AND DEBRIDEMENT;  Surgeon: [name], MD;  Location: [facility];  Service: Orthopedics;  Laterality: Right;  . PARTIAL HYSTERECTOMY     [place] left intact  . SQUAMOUS CELL CARCINOMA EXCISION  05/2018   Social History   Social History Narrative   Diet: Blank      Do you drink/ eat things with caffeine? Yes      Marital status:  Married                             What year were you married ? 196      Do you live in a house, apartment,assistred living, condo, trailer, etc.)? House      Is it one or more stories? 1      How many persons live in your home ? 2      Do you have any pets in your home ?(please list)  1 Sam-Dog      Highest Level of education completed: 12      Current or past profession: Office      Do you exercise?   Some                           Type & how often  Walk Daily      ADVANCED DIRECTIVES (Please bring copies)      Do you have a living will? Blank      Do you have a DNR form?  Blank                     If not, do you want to discuss one?       Do you have signed POA?HPOA forms?  Yes               If so, please bring to your appointment      FUNCTIONAL STATUS- To be completed by Spouse / child / Staff       Do you have difficulty bathing or dressing yourself ?  No      Do you have difficulty preparing food or eating ?  No      Do you have difficulty managing your  mediation ?  No      Do you have difficulty managing your finances ?  No      Do you have difficulty affording your medication ?  Yes (Some)      Immunization History  Administered Date(s) Administered  . Influenza Split 03/28/2008, 03/05/2010, 04/17/2012, 04/20/2013  . Influenza, High Dose Seasonal PF 02/08/2020  . Influenza-Unspecified 05/11/2011, 04/22/2014, 04/15/2015, 02/20/2016, 03/02/2017, 03/09/2018, 03/20/2019, 03/21/2020  . PFIZER SARS-COV-2 Vaccination 07/24/2019, 08/06/2019, 08/27/2019, 04/21/2020  . Pneumococcal Conjugate-13 05/02/2014  . Pneumococcal Polysaccharide-23 04/23/2008  . Tdap 05/01/2013  . Zoster 04/17/2012     Objective: Vital Signs: BP (!) 152/81 (BP Location: Right Arm, Patient Position: Sitting, Cuff Size: Normal)   Pulse 90   Ht 5\' 2"  (1.575 m)   Wt 152 lb 3.2 oz (69 kg)   BMI 27.84 kg/m    Physical Exam Vitals and nursing note reviewed.  Constitutional:      Appearance: She is well-developed and well-nourished.  HENT:     Head: Normocephalic and atraumatic.  Eyes:     Extraocular Movements: EOM normal.     Conjunctiva/sclera: Conjunctivae normal.  Cardiovascular:     Rate and Rhythm: Normal rate and regular rhythm.     Pulses: Intact distal pulses.     Heart sounds: Normal heart sounds.  Pulmonary:     Effort: Pulmonary effort is normal.     Breath sounds: Normal breath sounds.  Abdominal:     General: Bowel sounds are normal.     Palpations: Abdomen is soft.  Musculoskeletal:     Cervical back: Normal range of motion.  Lymphadenopathy:     Cervical: No cervical adenopathy.  Skin:    General: Skin is warm and dry.     Capillary Refill: Capillary refill takes less than 2 seconds.  Neurological:     Mental Status: She is alert and oriented to person, place, and time.  Psychiatric:        Mood and Affect: Mood and affect normal.        Behavior: Behavior normal.      Musculoskeletal Exam: C-spine had limited range of motion.   Shoulder joints, elbow joints, wrist joints with good range of motion.  She had no synovitis over MCPs or PIPs.  She had bilateral PIP and DIP thickening.  Hip joints with good range of motion.  Right knee joint.  Warm to touch.  Left knee joint and no warmth or swelling.  She had no tenderness across MTPs.  CDAI Exam: CDAI Score: 1.8  Patient Global: 5 mm; Provider Global: 3 mm Swollen: 0 ; Tender: 1  Joint Exam 07/14/2020      Right  Left  Knee   Tender        Investigation: No additional findings.  Imaging: CT ANGIO CHEST AORTA W/CM & OR WO/CM  Result Date: 07/08/2020 CLINICAL DATA:  79 year old female with history of malignant hypertension and discordant bilateral upper extremity noninvasive blood pressure measurements. EXAM: CT ANGIOGRAPHY CHEST WITH CONTRAST TECHNIQUE: Multidetector CT imaging of the chest was performed using the standard protocol during bolus administration of intravenous contrast. Multiplanar CT image reconstructions and MIPs were obtained to evaluate the vascular anatomy. CONTRAST:  100 mL Omnipaque 350, intravenous COMPARISON:  None. FINDINGS: Cardiovascular: Preferential opacification of the thoracic aorta. No evidence of thoracic aortic aneurysm or dissection. Scattered circumferential atherosclerotic calcification of the descending thoracic aorta which is widely patent. Normal heart size. No pericardial effusion. Sinues of Valsalva: 23 mm 23 x 23 mm Sinotubular Junction: 28 mm Ascending Aorta: 30 mm Aortic Arch: 26 mm Descending aorta: 22  mm at the level of the carina Branch vessels: Conventional branching pattern. Severe atherosclerotic changes of the proximal left subclavian artery from the ostium to the level of the left vertebral artery origin, approximately 2.7 mm, most prominent proximally. The remaining arch branch vessels are widely patent without significant atherosclerotic changes. Coronary arteries: Normal origins and courses. Mild atherosclerotic  calcifications. Main pulmonary artery: 22 mm. No evidence of central pulmonary embolism. Pulmonary veins: No anomalous pulmonary venous return. No evidence of left atrial appendage thrombus. Mediastinum/Nodes: No enlarged mediastinal, hilar, or axillary lymph nodes. Thyroid gland, trachea, and esophagus demonstrate no significant findings. Lungs/Pleura: No focal consolidations. No suspicious pulmonary nodules. No pleural effusion or pneumothorax. Upper Abdomen: The visualized upper abdomen is within normal limits. Musculoskeletal: No chest wall abnormality. No acute or significant osseous findings. Review of the MIP images confirms the above findings. IMPRESSION: Vascular: 1. Severe stenosis of the proximal left subclavian artery secondary to calcified atherosclerotic plaque. 2. Widely patent and normal appearing right brachiocephalic and right subclavian arteries. No evidence of aortic coarctation, as queried. 3.  Aortic Atherosclerosis (ICD10-I70.0). Non-Vascular: No acute thoracic abnormality. Ruthann Cancer, MD [facility] Electronically Signed   By: Ruthann Cancer MD   On: 07/08/2020 10:27   ECHOCARDIOGRAM COMPLETE  Result Date: 07/08/2020    ECHOCARDIOGRAM REPORT   Patient Name:   Annette Suarez  Date of Exam: 07/08/2020 Medical Rec #:  [id number]     Height:       61.0 in Accession #:    [id number]    Weight:       158.0 lb Date of Birth:  [birth year]     BSA:          1.709 m Patient Age:    59 years      BP:           140/92 mmHg Patient Gender: F             HR:           90 bpm. Exam Location:  [facility] Procedure: 2D Echo, 3D Echo, Cardiac Doppler, Color Doppler and Strain Analysis Indications:    I10 Hypertension  History:        Patient has no prior history of Echocardiogram examinations.                 Risk Factors:Diabetes, Sleep Apnea, Former Smoker and                 Dyslipidemia.  Sonographer:    Jessee Avers, RDCS Referring Phys: [id number]  [facility] A CHANDRASEKHAR IMPRESSIONS  1. Left ventricular ejection fraction, by estimation, is >75%. The left ventricle has hyperdynamic function. The left ventricle has no regional wall motion abnormalities. Left ventricular diastolic parameters are consistent with Grade I diastolic dysfunction (impaired relaxation). Elevated left atrial pressure.  2. Right ventricular systolic function is normal. The right ventricular size is normal. There is mildly elevated pulmonary artery systolic pressure.  3. The mitral valve is normal in structure. Mild mitral valve regurgitation. No evidence of mitral stenosis.  4. The aortic valve is tricuspid. Aortic valve regurgitation is trivial. Mild aortic valve sclerosis is present, with no evidence of aortic valve stenosis.  5. The inferior vena cava is normal in size with greater than 50% respiratory variability, suggesting right atrial pressure of 3 mmHg. FINDINGS  Left Ventricle: Left ventricular ejection fraction, by estimation, is >75%. The left ventricle has hyperdynamic function. The left  ventricle has no regional wall motion abnormalities. The left ventricular internal cavity size was normal in size. There is no left ventricular hypertrophy. Left ventricular diastolic parameters are consistent with Grade I diastolic dysfunction (impaired relaxation). Elevated left atrial pressure. Right Ventricle: The right ventricular size is normal. Right ventricular systolic function is normal. There is mildly elevated pulmonary artery systolic pressure. The tricuspid regurgitant velocity is 2.97 m/s, and with an assumed right atrial pressure of 3 mmHg, the estimated right ventricular systolic pressure is 0000000 mmHg. Left Atrium: Left atrial size was normal in size. Right Atrium: Right atrial size was normal in size. Pericardium: There is no evidence of pericardial effusion. Mitral Valve: The mitral valve is normal in structure. Mild mitral valve regurgitation. No evidence of mitral valve  stenosis. Tricuspid Valve: The tricuspid valve is normal in structure. Tricuspid valve regurgitation is mild . No evidence of tricuspid stenosis. Aortic Valve: The aortic valve is tricuspid. Aortic valve regurgitation is trivial. Mild aortic valve sclerosis is present, with no evidence of aortic valve stenosis. Pulmonic Valve: The pulmonic valve was normal in structure. Pulmonic valve regurgitation is trivial. No evidence of pulmonic stenosis. Aorta: The aortic root is normal in size and structure. Venous: The inferior vena cava is normal in size with greater than 50% respiratory variability, suggesting right atrial pressure of 3 mmHg. IAS/Shunts: No atrial level shunt detected by color flow Doppler.  LEFT VENTRICLE PLAX 2D LVIDd:         4.00 cm  Diastology LVIDs:         2.80 cm  LV e' medial:    4.28 cm/s LV PW:         0.90 cm  LV E/e' medial:  23.1 LV IVS:        0.90 cm  LV e' lateral:   5.46 cm/s LVOT diam:     2.00 cm  LV E/e' lateral: 18.1 LV SV:         75 LV SV Index:   44       2D Longitudinal Strain LVOT Area:     3.14 cm 2D Strain GLS (A2C):   -15.6 %                         2D Strain GLS (A3C):   -15.9 %                         2D Strain GLS (A4C):   -17.2 %                         2D Strain GLS Avg:     -16.2 %                          3D Volume EF:                         3D EF:        58 %                         LV EDV:       85 ml                         LV ESV:       36 ml  LV SV:        49 ml RIGHT VENTRICLE RV Basal diam:  3.20 cm RV S prime:     12.20 cm/s TAPSE (M-mode): 1.4 cm RVSP:           38.3 mmHg LEFT ATRIUM             Index       RIGHT ATRIUM           Index LA diam:        4.00 cm 2.34 cm/m  RA Pressure: 3.00 mmHg LA Vol (A2C):   32.1 ml 18.79 ml/m RA Area:     7.38 cm LA Vol (A4C):   29.9 ml 17.50 ml/m RA Volume:   11.20 ml  6.55 ml/m LA Biplane Vol: 31.7 ml 18.55 ml/m  AORTIC VALVE LVOT Vmax:   102.00 cm/s LVOT Vmean:  70.400 cm/s LVOT VTI:    0.239  m  AORTA Ao Root diam: 3.20 cm MITRAL VALVE                TRICUSPID VALVE                             TR Peak grad:   35.3 mmHg                             TR Vmax:        297.00 cm/s MV E velocity: 99.00 cm/s   Estimated RAP:  3.00 mmHg MV A velocity: 102.00 cm/s  RVSP:           38.3 mmHg MV E/A ratio:  0.97                             SHUNTS                             Systemic VTI:  0.24 m                             Systemic Diam: 2.00 cm Olga Millers MD Electronically signed by Olga Millers MD Signature Date/Time: 07/08/2020/11:39:54 AM    Final     Recent Labs: Lab Results  Component Value Date   WBC 13.1 (H) 06/03/2020   HGB 13.7 06/03/2020   PLT 386 06/03/2020   NA 141 06/11/2020   K 4.6 06/11/2020   CL 101 06/11/2020   CO2 24 06/11/2020   GLUCOSE 169 (H) 06/11/2020   BUN 16 06/11/2020   CREATININE 0.97 06/11/2020   BILITOT 1.0 06/03/2020   ALKPHOS 83 07/25/2019   AST 12 06/03/2020   ALT 10 06/03/2020   PROT 7.3 06/03/2020   ALBUMIN 4.6 07/25/2019   CALCIUM 9.4 06/11/2020   GFRAA 65 06/11/2020   QFTBGOLDPLUS NEGATIVE 06/13/2018    Speciality Comments: Recieves Enbrel through [facility] 02/20/2019 Osteopenia managed by Dr. Eli Phillips 02/20/2019  Procedures:  No procedures performed Allergies: Amaryl [glimepiride], Codeine, Jardiance [empagliflozin], Pneumovax 23 [pneumococcal vac polyvalent], Wellbutrin [bupropion], and Latex   Assessment / Plan:     Visit Diagnoses: Rheumatoid arthritis of multiple sites with negative rheumatoid factor (HCC)-patient had no synovitis on examination.  She states she skipped Enbrel for a week when she had right knee joint meniscal tear  repair.  She has decreased grip or strength due to rheumatoid arthritis and osteoarthritis overlap.  High risk medication use - Enbrel 50 mg sq injections once weekly. (inadequate response to [place]).  CBC and CMP were normal in the last week of November.  Have advised her to get repeat  labs in 3 months.- Plan: QuantiFERON-TB Gold Plus today.  Primary osteoarthritis of both hands-joint protection muscle strengthening was discussed.  Paresthesia of both hands - Resolved  Primary osteoarthritis of both feet-proper fitting shoes were discussed.  Osteopenia of multiple sites - Managed by her PCP-DEXA 02/20/19 patient states she has a DEXA scheduled.  Trochanteric bursitis, right hip-she has off-and-on discomfort.  Stretching exercises were advised.  Other medical problems are listed as Follows.  Gastroesophageal reflux disease without esophagitis  History of sleep apnea  History of vertigo  Type 2 diabetes mellitus   Essential hypertension-systolic blood pressure is elevated today.  Have advised her to monitor blood pressure closely and follow-up with her PCP.  Association of heart disease with rheumatoid arthritis was discussed.  Heart healthy diet was discussed and information was placed in the AVS.  History of anxiety  History of hyperlipidemia  Former smoker  Educated about COVID-19 virus infection-she is fully vaccinated against COVID-19 and received a booster in October.  Use of mask, social distancing and hand hygiene was emphasized.  I also discussed that in case she develops COVID-19 infection she will have to stop Enbrel and restart 3 weeks after she completely becomes asymptomatic.  She will also be a candidate for monoclonal antibody infusion depending on her symptoms.  She should notify her PCPs office or our office in case she does develop symptoms.  Orders: Orders Placed This Encounter  Procedures  . QuantiFERON-TB Gold Plus   No orders of the defined types were placed in this encounter.   Follow-Up Instructions: Return in about 4 months (around 11/11/2020) for Rheumatoid arthritis, Osteoarthritis.   Bo Merino, MD  Note - This record has been created using Editor, commissioning.  Chart creation errors have been sought, but may not always   have been located. Such creation errors do not reflect on  the standard of medical care.

## 2020-07-03 LAB — HM MAMMOGRAPHY

## 2020-07-07 ENCOUNTER — Encounter: Payer: Self-pay | Admitting: *Deleted

## 2020-07-08 ENCOUNTER — Ambulatory Visit (HOSPITAL_COMMUNITY): Payer: Medicare Other | Attending: Cardiology

## 2020-07-08 ENCOUNTER — Ambulatory Visit (INDEPENDENT_AMBULATORY_CARE_PROVIDER_SITE_OTHER)
Admission: RE | Admit: 2020-07-08 | Discharge: 2020-07-08 | Disposition: A | Discharge status: Home / Self Care | Payer: Medicare Other | Source: Ambulatory Visit | Attending: Internal Medicine | Admitting: Internal Medicine

## 2020-07-08 ENCOUNTER — Other Ambulatory Visit: Payer: Self-pay | Admitting: Internal Medicine

## 2020-07-08 ENCOUNTER — Other Ambulatory Visit: Payer: Self-pay

## 2020-07-08 DIAGNOSIS — I2510 Atherosclerotic heart disease of native coronary artery without angina pectoris: Secondary | ICD-10-CM | POA: Diagnosis not present

## 2020-07-08 DIAGNOSIS — I10 Essential (primary) hypertension: Secondary | ICD-10-CM

## 2020-07-08 DIAGNOSIS — I771 Stricture of artery: Secondary | ICD-10-CM | POA: Diagnosis not present

## 2020-07-08 DIAGNOSIS — I708 Atherosclerosis of other arteries: Secondary | ICD-10-CM | POA: Diagnosis not present

## 2020-07-08 LAB — ECHOCARDIOGRAM COMPLETE
Area-P 1/2: 2.91 cm2
S' Lateral: 2.8 cm

## 2020-07-08 MED ORDER — IOHEXOL 350 MG/ML SOLN
100.0000 mL | Freq: Once | INTRAVENOUS | Status: AC | PRN
  Administered 2020-07-08: 100 mL via INTRAVENOUS

## 2020-07-14 ENCOUNTER — Encounter: Payer: Self-pay | Admitting: Rheumatology

## 2020-07-14 ENCOUNTER — Ambulatory Visit: Payer: Medicare Other | Admitting: Rheumatology

## 2020-07-14 ENCOUNTER — Other Ambulatory Visit: Payer: Self-pay

## 2020-07-14 VITALS — BP 152/81 | HR 90 | Ht 62.0 in | Wt 152.2 lb

## 2020-07-14 DIAGNOSIS — M7061 Trochanteric bursitis, right hip: Secondary | ICD-10-CM | POA: Diagnosis not present

## 2020-07-14 DIAGNOSIS — Z8639 Personal history of other endocrine, nutritional and metabolic disease: Secondary | ICD-10-CM

## 2020-07-14 DIAGNOSIS — Z87898 Personal history of other specified conditions: Secondary | ICD-10-CM

## 2020-07-14 DIAGNOSIS — Z8669 Personal history of other diseases of the nervous system and sense organs: Secondary | ICD-10-CM | POA: Diagnosis not present

## 2020-07-14 DIAGNOSIS — M19042 Primary osteoarthritis, left hand: Secondary | ICD-10-CM

## 2020-07-14 DIAGNOSIS — Z7189 Other specified counseling: Secondary | ICD-10-CM

## 2020-07-14 DIAGNOSIS — M19071 Primary osteoarthritis, right ankle and foot: Secondary | ICD-10-CM | POA: Diagnosis not present

## 2020-07-14 DIAGNOSIS — Z79899 Other long term (current) drug therapy: Secondary | ICD-10-CM

## 2020-07-14 DIAGNOSIS — Z794 Long term (current) use of insulin: Secondary | ICD-10-CM

## 2020-07-14 DIAGNOSIS — Z87891 Personal history of nicotine dependence: Secondary | ICD-10-CM

## 2020-07-14 DIAGNOSIS — M8589 Other specified disorders of bone density and structure, multiple sites: Secondary | ICD-10-CM

## 2020-07-14 DIAGNOSIS — M19072 Primary osteoarthritis, left ankle and foot: Secondary | ICD-10-CM

## 2020-07-14 DIAGNOSIS — E118 Type 2 diabetes mellitus with unspecified complications: Secondary | ICD-10-CM | POA: Diagnosis not present

## 2020-07-14 DIAGNOSIS — I10 Essential (primary) hypertension: Secondary | ICD-10-CM | POA: Diagnosis not present

## 2020-07-14 DIAGNOSIS — M0609 Rheumatoid arthritis without rheumatoid factor, multiple sites: Secondary | ICD-10-CM | POA: Diagnosis not present

## 2020-07-14 DIAGNOSIS — K219 Gastro-esophageal reflux disease without esophagitis: Secondary | ICD-10-CM | POA: Diagnosis not present

## 2020-07-14 DIAGNOSIS — Z8659 Personal history of other mental and behavioral disorders: Secondary | ICD-10-CM

## 2020-07-14 DIAGNOSIS — M19041 Primary osteoarthritis, right hand: Secondary | ICD-10-CM

## 2020-07-14 NOTE — Patient Instructions (Addendum)
Standing Labs We placed an order today for your standing lab work.   Please have your standing labs drawn in March (first week) and every 3 months  If possible, please have your labs drawn 2 weeks prior to your appointment so that the provider can discuss your results at your appointment.  We have open lab daily Monday through Thursday from 8:30-12:30 PM and 1:30-4:30 PM and Friday from 8:30-12:30 PM and 1:30-4:00 PM at the office of Dr. Bo Merino, [facility].   Please be advised, patients with office appointments requiring lab work will take precedents over walk-in lab work.  If possible, please come for your lab work on Monday and Friday afternoons, as you may experience shorter wait times. The office is located at [address] [place], [place], [place] [address] No appointment is necessary.   Labs are drawn by [facility]. Please bring your co-pay at the time of your lab draw.  You may receive a bill from [facility] for your lab work.  If you wish to have your labs drawn at another location, please call the office 24 hours in advance to send orders.  If you have any questions regarding directions or hours of operation,  please call [phone].   As a reminder, please drink plenty of water prior to coming for your lab work. Thanks!   Heart Disease Prevention   Your inflammatory disease increases your risk of heart disease which includes heart attack, stroke, atrial fibrillation (irregular heartbeats), high blood pressure, heart failure and atherosclerosis (plaque in the arteries).  It is important to reduce your risk by:   . Keep blood pressure, cholesterol, and blood sugar at healthy levels   . Smoking Cessation   . Maintain a healthy weight  o BMI 20-25   . Eat a healthy diet  o Plenty of fresh fruit, vegetables, and whole grains  o Limit saturated fats, foods high in sodium, and added sugars  o DASH and Mediterranean diet   . Increase physical activity   o Recommend moderate physically activity for 150 minutes per week/ 30 minutes a day for five days a week These can be broken up into three separate ten-minute sessions during the day.   . Reduce Stress  . Meditation, slow breathing exercises, yoga, coloring books  . Dental visits twice a year

## 2020-07-16 ENCOUNTER — Ambulatory Visit (HOSPITAL_COMMUNITY)
Admission: RE | Admit: 2020-07-16 | Discharge: 2020-07-16 | Disposition: A | Discharge status: Home / Self Care | Payer: Medicare Other | Source: Ambulatory Visit | Attending: Cardiology | Admitting: Cardiology

## 2020-07-16 ENCOUNTER — Other Ambulatory Visit: Payer: Self-pay

## 2020-07-16 ENCOUNTER — Other Ambulatory Visit: Payer: Self-pay | Admitting: Internal Medicine

## 2020-07-16 DIAGNOSIS — I771 Stricture of artery: Secondary | ICD-10-CM

## 2020-07-16 DIAGNOSIS — I6522 Occlusion and stenosis of left carotid artery: Secondary | ICD-10-CM

## 2020-07-16 LAB — QUANTIFERON-TB GOLD PLUS
Mitogen-NIL: >10 IU/mL
NIL: 0.03 IU/mL
QuantiFERON-TB Gold Plus: NEGATIVE
TB1-NIL: <0 IU/mL
TB2-NIL: <0 IU/mL

## 2020-07-22 ENCOUNTER — Telehealth: Payer: Self-pay | Admitting: Pharmacist

## 2020-07-22 NOTE — Telephone Encounter (Signed)
Received a fax from  [place] regarding an approval for ENBREL patient assistance from 07/22/20 to 07/04/21   Phone number: [phone]  Knox Saliva, PharmD, MPH Clinical Pharmacist (Rheumatology and Pulmonology)

## 2020-08-11 ENCOUNTER — Encounter: Payer: Self-pay | Admitting: Orthopedic Surgery

## 2020-08-11 ENCOUNTER — Ambulatory Visit (INDEPENDENT_AMBULATORY_CARE_PROVIDER_SITE_OTHER): Payer: Medicare Other

## 2020-08-11 ENCOUNTER — Ambulatory Visit (INDEPENDENT_AMBULATORY_CARE_PROVIDER_SITE_OTHER): Payer: Medicare Other | Admitting: Orthopedic Surgery

## 2020-08-11 DIAGNOSIS — G8929 Other chronic pain: Secondary | ICD-10-CM | POA: Diagnosis not present

## 2020-08-11 DIAGNOSIS — M25561 Pain in right knee: Secondary | ICD-10-CM

## 2020-08-11 DIAGNOSIS — M1711 Unilateral primary osteoarthritis, right knee: Secondary | ICD-10-CM

## 2020-08-11 NOTE — Progress Notes (Signed)
Office Visit Note   Patient: Annette Suarez           Date of Birth: 08/06/1940           MRN: [id number] Visit Date: 08/11/2020              Requested by: [name], NP [id number],  [place] [address] PCP: [name], NP  Chief Complaint  Patient presents with  . Right Knee - Pain    05/20/20 right knee scope       HPI: Patient is a 80 year old woman who is 3 months status post right knee arthroscopy and debridement.  Patient states she still has a little bit of swelling lacks full range of motion and has some warmth in her knee she states she has some days that are good some days that are bad pain primarily globally around the knee.  Assessment & Plan: Visit Diagnoses:  1. Chronic pain of right knee   2. Unilateral primary osteoarthritis, right knee     Plan: Patient has been doing excellent exercises on her own we will have her work with physical therapy to help with knee strengthening reevaluate in 4 weeks.  Follow-Up Instructions: No follow-ups on file.   Ortho Exam  Patient is alert, oriented, no adenopathy, well-dressed, normal affect, normal respiratory effort. Examination patient lacks full flexion due to mild effusion of the right knee.  There is no crepitation with range of motion Clausen cruciates are stable.  She has some tenderness to palpation of the medial and lateral joint lines as well as patellofemoral joint the patella tracks midline there is no signs of infection.  Imaging: No results found. No images are attached to the encounter.  Labs: Lab Results  Component Value Date   HGBA1C 8.0 (H) 06/03/2020   HGBA1C 7.5 01/22/2020   HGBA1C 8.1 11/26/2019   ESRSEDRATE 32 (H) 09/08/2016   LABURIC 5.7 09/12/2017     Lab Results  Component Value Date   ALBUMIN 4.6 07/25/2019   ALBUMIN 3.2 (L) 09/09/2017   ALBUMIN 3.9 09/08/2017   LABURIC 5.7 09/12/2017    Lab Results  Component Value Date   MG 1.2 (L) 09/09/2017   No results  found for: VD25OH  No results found for: PREALBUMIN CBC EXTENDED Latest Ref Rng & Units 06/03/2020 03/14/2020 12/31/2019  WBC 3.8 - 10.8 Thousand/uL 13.1(H) 13.1(H) 9.0  RBC 3.80 - 5.10 Million/uL 4.31 4.33 4.12  HGB 11.7 - 15.5 g/dL 13.7 13.5 12.9  HCT 35.0 - 45.0 % 39.6 40.1 37.7  PLT 140 - 400 Thousand/uL 386 357 292  NEUTROABS 1,500 - 7,800 cells/uL 9,406(H) 9,367(H) 5,652  LYMPHSABS 850 - 3,900 cells/uL 2,856 2,764 2,376     There is no height or weight on file to calculate BMI.  Orders:  Orders Placed This Encounter  Procedures  . XR Knee 1-2 Views Right   No orders of the defined types were placed in this encounter.    Procedures: No procedures performed  Clinical Data: No additional findings.  ROS:  All other systems negative, except as noted in the HPI. Review of Systems  Objective: Vital Signs: There were no vitals taken for this visit.  Specialty Comments:  No specialty comments available.  PMFS History: Patient Active Problem List   Diagnosis Date Noted  . Alteration in blood pressure 06/11/2020  . Old complex tear of medial meniscus of right knee   . Old complex tear of lateral meniscus  of right knee   . Primary osteoarthritis of both feet 12/13/2017  . Dehydration 09/09/2017  . Acute lower UTI 09/09/2017  . Nausea & vomiting 09/08/2017  . Primary osteoarthritis of both hands 04/05/2017  . Left carpal tunnel syndrome 11/15/2016  . Rheumatoid arthritis of multiple sites with negative rheumatoid factor ([place]) 10/06/2016  . High risk medication use 10/06/2016  . Dyslipidemia 10/06/2016  . Pain in both hands 09/08/2016  . Diabetes mellitus with coincident hypertension ([place]) 09/08/2016  . Type 2 diabetes mellitus  09/08/2016  . Gastroesophageal reflux disease without esophagitis 09/08/2016  . Osteopenia of multiple sites 09/08/2016  . History of anxiety 09/08/2016  . Benign paroxysmal positional vertigo 09/08/2016  . Smoker 09/08/2016  . Sleep  apnea 09/08/2016   Past Medical History:  Diagnosis Date  . Acute cystitis    UTI  . Anxiety   . Arthritis   . Benign positional vertigo   . Dermatitis   . Diabetes mellitus without complication ([place])   . Family history of adverse reaction to anesthesia   . GERD (gastroesophageal reflux disease)   . Goiter, nontoxic, multinodular   . Gout   . H/O: hysterectomy   . History of tobacco use   . Hx laparoscopic cholecystectomy   . Hypercholesterolemia   . Hypertension   . Incomplete bladder emptying   . Insomnia   . Postmenopausal atrophic vaginitis   . RA (rheumatoid arthritis) ([place])   . Rectocele   . Renal calculus   . Sleep apnea   . Thyroid condition   . Type 2 diabetes mellitus ([place])   . Urolithiasis   . Vaginal enterocele     Family History  Problem Relation Age of Onset  . Diabetes Mother   . Heart disease Father 57  . Diabetes Son   . Crohn's disease Son     Past Surgical History:  Procedure Laterality Date  . CARPAL TUNNEL RELEASE  2018  . CATARACT EXTRACTION, BILATERAL    . CHOLECYSTECTOMY    . COLONOSCOPY     07/11/2009, 02/2015  . GALLBLADDER SURGERY    . KNEE ARTHROSCOPY Right 05/20/2020   Procedure: RIGHT KNEE ARTHROSCOPY AND DEBRIDEMENT;  Surgeon: [name], MD;  Location: [facility];  Service: Orthopedics;  Laterality: Right;  . PARTIAL HYSTERECTOMY     Overies left intact  . SQUAMOUS CELL CARCINOMA EXCISION  05/2018   Social History   Occupational History  . Not on file  Tobacco Use  . Smoking status: Former Smoker    Packs/day: 0.20    Years: 10.00    Pack years: 2.00    Types: Cigarettes    Quit date: 07/05/1978    Years since quitting: 42.1  . Smokeless tobacco: Never Used  Vaping Use  . Vaping Use: Never used  Substance and Sexual Activity  . Alcohol use: No  . Drug use: No  . Sexual activity: Not Currently

## 2020-08-13 ENCOUNTER — Other Ambulatory Visit: Payer: Self-pay

## 2020-08-13 ENCOUNTER — Ambulatory Visit: Payer: Medicare Other | Admitting: Rehabilitative and Restorative Service Providers"

## 2020-08-13 ENCOUNTER — Encounter: Payer: Self-pay | Admitting: Rehabilitative and Restorative Service Providers"

## 2020-08-13 DIAGNOSIS — M25561 Pain in right knee: Secondary | ICD-10-CM

## 2020-08-13 DIAGNOSIS — M6281 Muscle weakness (generalized): Secondary | ICD-10-CM | POA: Diagnosis not present

## 2020-08-13 DIAGNOSIS — R600 Localized edema: Secondary | ICD-10-CM | POA: Diagnosis not present

## 2020-08-13 DIAGNOSIS — R262 Difficulty in walking, not elsewhere classified: Secondary | ICD-10-CM

## 2020-08-13 DIAGNOSIS — M25661 Stiffness of right knee, not elsewhere classified: Secondary | ICD-10-CM | POA: Diagnosis not present

## 2020-08-13 DIAGNOSIS — G8929 Other chronic pain: Secondary | ICD-10-CM

## 2020-08-13 NOTE — Therapy (Signed)
[facility] [place], [place], [address] Phone: [phone]   Fax:  [phone]  Physical Therapy Evaluation  Patient Details  Name: Annette Suarez MRN: [id number] Date of Birth: [birth year] Referring Provider (PT): [name] MD   Encounter Date: 08/13/2020   PT End of Session - 08/13/20 1737    Visit Number 1    Number of Visits 12    PT Start Time 6503    PT Stop Time 1600    PT Time Calculation (min) 45 min    Activity Tolerance Patient tolerated treatment well;No increased pain;Patient limited by fatigue    Behavior During Therapy [facility] for tasks assessed/performed           Past Medical History:  Diagnosis Date  . Acute cystitis    UTI  . Anxiety   . Arthritis   . Benign positional vertigo   . Dermatitis   . Diabetes mellitus without complication ([place])   . Family history of adverse reaction to anesthesia   . GERD (gastroesophageal reflux disease)   . Goiter, nontoxic, multinodular   . Gout   . H/O: hysterectomy   . History of tobacco use   . Hx laparoscopic cholecystectomy   . Hypercholesterolemia   . Hypertension   . Incomplete bladder emptying   . Insomnia   . Postmenopausal atrophic vaginitis   . RA (rheumatoid arthritis) ([place])   . Rectocele   . Renal calculus   . Sleep apnea   . Thyroid condition   . Type 2 diabetes mellitus ([place])   . Urolithiasis   . Vaginal enterocele     Past Surgical History:  Procedure Laterality Date  . CARPAL TUNNEL RELEASE  2018  . CATARACT EXTRACTION, BILATERAL    . CHOLECYSTECTOMY    . COLONOSCOPY     07/11/2009, 02/2015  . GALLBLADDER SURGERY    . KNEE ARTHROSCOPY Right 05/20/2020   Procedure: RIGHT KNEE ARTHROSCOPY AND DEBRIDEMENT;  Surgeon: [name], MD;  Location: [facility];  Service: Orthopedics;  Laterality: Right;  . PARTIAL HYSTERECTOMY     Overies left intact  . SQUAMOUS CELL CARCINOMA EXCISION  05/2018    There were no vitals filed for  this visit.    Subjective Assessment - 08/13/20 1734    Subjective Byanka had her R knee scoped in November of 2021.  She has improved but is not where she would like to be.  She would like to get stronger, have less swelling and have less R knee pain.    Limitations Standing;Walking    Patient Stated Goals Reduce pain, edema and get stronger.    Currently in Pain? Yes    Pain Score 3     Pain Location Knee    Pain Orientation Right;Anterior    Pain Descriptors / Indicators Aching;Sore;Tightness    Pain Type Chronic pain;Surgical pain    Pain Onset More than a month ago    Pain Frequency Intermittent    Aggravating Factors  WB and prolonged postures    Effect of Pain on Daily Activities Limited endurance particularly with WB function    Multiple Pain Sites No              [facility] PT Assessment - 08/13/20 0001      Assessment   Medical Diagnosis R knee OA and s/p arthroscopy    Referring Provider (PT) [name] MD    Onset Date/Surgical Date 05/20/20  Balance Screen   Has the patient fallen in the past 6 months No    Has the patient had a decrease in activity level because of a fear of falling?  No    Is the patient reluctant to leave their home because of a fear of falling?  No      Prior Function   Level of Independence Independent      Cognition   Overall Cognitive Status Within Functional Limits for tasks assessed      Observation/Other Assessments   Focus on Therapeutic Outcomes (FOTO)  50 (Goal 68)      Observation/Other Assessments-Edema    Edema --   Noted later in the day and with WB     ROM / Strength   AROM / PROM / Strength AROM;Strength      AROM   Overall AROM  Deficits    AROM Assessment Site Knee    Right/Left Knee Left;Right    Right Knee Extension 0    Right Knee Flexion 115    Left Knee Extension 0    Left Knee Flexion 125      Strength   Overall Strength Deficits    Strength Assessment Site Knee    Right/Left Knee Left;Right     Right Knee Extension 3+/5    Left Knee Extension 4-/5                      Objective measurements completed on examination: See above findings.       [facility] Adult PT Treatment/Exercise - 08/13/20 0001      Exercises   Exercises Knee/Hip      Knee/Hip Exercises: Seated   Long Arc Quad Strengthening;Both;3 sets;5 reps;Other (comment)    Long Arc Quad Limitations Seated straight leg raises    Sit to [employer] 5 reps;without UE support;Other (comment)   slow eccentrics                 PT Education - 08/13/20 1737    Education Details Reviewed exam findings and started an appropriate HEP.    Person(s) Educated Patient    Methods Explanation;Demonstration;Tactile cues;Verbal cues;Handout    Comprehension Verbal cues required;Returned demonstration;Need further instruction;Verbalized understanding;Tactile cues required            PT Short Term Goals - 08/13/20 1743      PT SHORT TERM GOAL #1   Title [name] will be independent with her starter long-term HEP.    Time 2    Period Weeks    Status New    Target Date 08/28/20             PT Long Term Goals - 08/13/20 1744      PT LONG TERM GOAL #1   Title [name] will improve her self-reported FOTO score to 68.    Baseline 50    Time 6    Period Weeks    Status New    Target Date 10/03/20      PT LONG TERM GOAL #2   Title Improve B quadriceps strength to at least 4/5 MMT.    Baseline See objective.    Time 6    Period Weeks    Status New    Target Date 10/03/20      PT LONG TERM GOAL #3   Title [name] will report R knee pain consistently 0-3/10 on the Numeric Pain Rating Scale.    Baseline Can be 5+/10    Time  6    Period Weeks    Status New    Target Date 10/03/20      PT LONG TERM GOAL #4   Title [name] will report improved endurance with WB function (standing and walking) as assessed by FOTO and subjective self-report.    Baseline 50 FOTO    Time 6    Period Weeks    Status New    Target  Date 10/03/20      PT LONG TERM GOAL #5   Title [name] will be independent with her long-term HEP at DC.    Time 6    Period Weeks    Status New    Target Date 10/03/20                  Plan - 08/13/20 1738    Clinical Impression Statement Sharae is doing better after a R knee arthroscopy in November of 2021.  Due to increasing edema later in the day and with activity, R knee pain and limited endurance, she would like to give PT a shot to see if she can make progress.  She has the expected R knee stiffness and soreness and has very weak quadriceps bilaterally.  Quadriceps strengthening and endurance will be the primary focus of Anique's PT.    Personal Factors and Comorbidities Comorbidity 1    Comorbidities Previously mentioned arthroscopy, BPPV, Type 2 DM, B foot OA    Examination-Activity Limitations Lift;Squat;Locomotion Level;Stairs;Stand    Examination-Participation Restrictions Community Activity;Cleaning    Stability/Clinical Decision Making Stable/Uncomplicated    Clinical Decision Making Low    Rehab Potential Good    PT Frequency 2x / week    PT Duration 6 weeks    PT Treatment/Interventions ADLs/Self Care Home Management;Moist Heat;Cryotherapy;Occupational psychologist;Therapeutic activities;Neuromuscular re-education;Therapeutic exercise;Balance training;Patient/family education;Vasopneumatic Device    PT Next Visit Plan Quadriceps and general LE strength and conditioning work    PT Home Exercise Plan Access Code: [email or web address]    Consulted and Agree with Plan of Care Patient           Patient will benefit from skilled therapeutic intervention in order to improve the following deficits and impairments:  Abnormal gait,Cardiopulmonary status limiting activity,Decreased activity tolerance,Decreased endurance,Decreased range of motion,Decreased mobility,Decreased strength,Difficulty walking,Hypomobility,Increased edema,Impaired  flexibility,Pain  Visit Diagnosis: Difficulty walking  Muscle weakness (generalized)  Localized edema  Chronic pain of right knee  Stiffness of right knee, not elsewhere classified     Problem List Patient Active Problem List   Diagnosis Date Noted  . Alteration in blood pressure 06/11/2020  . Old complex tear of medial meniscus of right knee   . Old complex tear of lateral meniscus of right knee   . Primary osteoarthritis of both feet 12/13/2017  . Dehydration 09/09/2017  . Acute lower UTI 09/09/2017  . Nausea & vomiting 09/08/2017  . Primary osteoarthritis of both hands 04/05/2017  . Left carpal tunnel syndrome 11/15/2016  . Rheumatoid arthritis of multiple sites with negative rheumatoid factor ([place]) 10/06/2016  . High risk medication use 10/06/2016  . Dyslipidemia 10/06/2016  . Pain in both hands 09/08/2016  . Diabetes mellitus with coincident hypertension ([place]) 09/08/2016  . Type 2 diabetes mellitus  09/08/2016  . Gastroesophageal reflux disease without esophagitis 09/08/2016  . Osteopenia of multiple sites 09/08/2016  . History of anxiety 09/08/2016  . Benign paroxysmal positional vertigo 09/08/2016  . Smoker 09/08/2016  . Sleep apnea 09/08/2016    Farley Ly PT, MPT 08/13/2020, 5:48 PM  [facility] [address] [place], [place], [address] Phone: [phone]   Fax:  [phone]  Name: RENAY CRAMMER MRN: [id number] Date of Birth: Aug 07, 1940

## 2020-08-13 NOTE — Patient Instructions (Signed)
Access Code: [email or web address] URL: [email or web address] Date: 08/13/2020 Prepared by: Vista Mink  Exercises Small Range Straight Leg Raise - 2 x daily - 7 x weekly - 3-5 sets - 5 reps - 3 seconds hold Sit to Stand with Armchair - 2 x daily - 7 x weekly - 1 sets - 5 reps

## 2020-08-18 ENCOUNTER — Encounter: Payer: Self-pay | Admitting: Physical Therapy

## 2020-08-18 ENCOUNTER — Ambulatory Visit: Payer: Medicare Other | Admitting: Physical Therapy

## 2020-08-18 ENCOUNTER — Other Ambulatory Visit: Payer: Self-pay

## 2020-08-18 DIAGNOSIS — R600 Localized edema: Secondary | ICD-10-CM

## 2020-08-18 DIAGNOSIS — R262 Difficulty in walking, not elsewhere classified: Secondary | ICD-10-CM

## 2020-08-18 DIAGNOSIS — M6281 Muscle weakness (generalized): Secondary | ICD-10-CM | POA: Diagnosis not present

## 2020-08-18 DIAGNOSIS — M25561 Pain in right knee: Secondary | ICD-10-CM

## 2020-08-18 DIAGNOSIS — G8929 Other chronic pain: Secondary | ICD-10-CM | POA: Diagnosis not present

## 2020-08-18 DIAGNOSIS — M25661 Stiffness of right knee, not elsewhere classified: Secondary | ICD-10-CM

## 2020-08-18 NOTE — Patient Instructions (Signed)
Access Code: [email or web address] URL: [email or web address] Date: 08/18/2020 Prepared by: Kearney Hard  Exercises Small Range Straight Leg Raise - 2 x daily - 7 x weekly - 3-5 sets - 5 reps - 3 seconds hold Sit to Stand with Armchair - 2 x daily - 7 x weekly - 1 sets - 5 reps Supine Bridge - 2 x daily - 7 x weekly - 2 sets - 10 reps Hooklying Clamshell with Resistance - 2 x daily - 7 x weekly - 2 sets - 10 reps

## 2020-08-18 NOTE — Therapy (Signed)
[place], [place], [address] Phone: [phone]   Fax:  [phone]  Physical Therapy Treatment  Patient Details  Name: Annette Suarez MRN: [id number] Date of Birth: 1940-09-06 Referring Provider (PT): [name] MD   Encounter Date: 08/18/2020   PT End of Session - 08/18/20 1029    Visit Number 2    Number of Visits 12    PT Start Time 1020    PT Stop Time 2703    PT Time Calculation (min) 45 min    Activity Tolerance Patient tolerated treatment well;No increased pain;Patient limited by fatigue    Behavior During Therapy [facility] for tasks assessed/performed           Past Medical History:  Diagnosis Date  . Acute cystitis    UTI  . Anxiety   . Arthritis   . Benign positional vertigo   . Dermatitis   . Diabetes mellitus without complication ([place])   . Family history of adverse reaction to anesthesia   . GERD (gastroesophageal reflux disease)   . Goiter, nontoxic, multinodular   . Gout   . H/O: hysterectomy   . History of tobacco use   . Hx laparoscopic cholecystectomy   . Hypercholesterolemia   . Hypertension   . Incomplete bladder emptying   . Insomnia   . Postmenopausal atrophic vaginitis   . RA (rheumatoid arthritis) ([place])   . Rectocele   . Renal calculus   . Sleep apnea   . Thyroid condition   . Type 2 diabetes mellitus ([place])   . Urolithiasis   . Vaginal enterocele     Past Surgical History:  Procedure Laterality Date  . CARPAL TUNNEL RELEASE  2018  . CATARACT EXTRACTION, BILATERAL    . CHOLECYSTECTOMY    . COLONOSCOPY     07/11/2009, 02/2015  . GALLBLADDER SURGERY    . KNEE ARTHROSCOPY Right 05/20/2020   Procedure: RIGHT KNEE ARTHROSCOPY AND DEBRIDEMENT;  Surgeon: [name], MD;  Location: [facility];  Service: Orthopedics;  Laterality: Right;  . PARTIAL HYSTERECTOMY     Overies left intact  . SQUAMOUS CELL CARCINOMA EXCISION  05/2018    There were no vitals filed for  this visit.   Subjective Assessment - 08/18/20 1026    Subjective Pt arriving to therapy reporting medial R knee pain.    Limitations Standing;Walking    Patient Stated Goals Reduce pain, edema and get stronger.    Pain Score 4     Pain Location Knee    Pain Orientation Right    Pain Descriptors / Indicators Aching;Sore    Pain Onset More than a month ago    Pain Frequency Intermittent                             [facility] Adult PT Treatment/Exercise - 08/18/20 0001      Exercises   Exercises Knee/Hip      Knee/Hip Exercises: Stretches   Active Hamstring Stretch Right;3 reps;30 seconds    Gastroc Stretch 3 reps;30 seconds    Gastroc Stretch Limitations slant board      Knee/Hip Exercises: Aerobic   Recumbent Bike L2 5 minutes      Knee/Hip Exercises: Standing   Heel Raises Both;15 reps    Forward Step Up Right;10 reps;Hand Hold: 1;Step Height: 4"    Functional Squat Limitations mini squats x 10 with bilateral UE support and verbal  instructions and tactile cues to correct technique      Knee/Hip Exercises: Seated   Long Arc Quad Strengthening;Both;3 sets;5 reps;Other (comment)      Knee/Hip Exercises: Supine   Bridges Strengthening;Right;15 reps    Straight Leg Raises Strengthening;Right;10 reps    Straight Leg Raises Limitations instructions to perform quad set prior to lifting    Patellar Mobs gentle mobs with no limitations    Other Supine Knee/Hip Exercises clams with red theraband 2x10      Modalities   Modalities Cryotherapy      Cryotherapy   Number Minutes Cryotherapy 5 Minutes    Cryotherapy Location Knee    Type of Cryotherapy Ice pack                  PT Education - 08/18/20 1029    Education Details Exercise technique    Person(s) Educated Patient    Methods Explanation;Demonstration    Comprehension Verbalized understanding;Returned demonstration            PT Short Term Goals - 08/13/20 1743      PT SHORT TERM GOAL #1    Title Annette Suarez will be independent with her starter long-term HEP.    Time 2    Period Weeks    Status New    Target Date 08/28/20             PT Long Term Goals - 08/13/20 1744      PT LONG TERM GOAL #1   Title Annette Suarez will improve her self-reported FOTO score to 68.    Baseline 50    Time 6    Period Weeks    Status New    Target Date 10/03/20      PT LONG TERM GOAL #2   Title Improve B quadriceps strength to at least 4/5 MMT.    Baseline See objective.    Time 6    Period Weeks    Status New    Target Date 10/03/20      PT LONG TERM GOAL #3   Title Annette Suarez will report R knee pain consistently 0-3/10 on the Numeric Pain Rating Scale.    Baseline Can be 5+/10    Time 6    Period Weeks    Status New    Target Date 10/03/20      PT LONG TERM GOAL #4   Title Annette Suarez will report improved endurance with WB function (standing and walking) as assessed by FOTO and subjective self-report.    Baseline 50 FOTO    Time 6    Period Weeks    Status New    Target Date 10/03/20      PT LONG TERM GOAL #5   Title Annette Suarez will be independent with her long-term HEP at DC.    Time 6    Period Weeks    Status New    Target Date 10/03/20                 Plan - 08/18/20 1034    Clinical Impression Statement Pt arriving reporting pain in R knee of 4/10. Pt instructed in exericse techniques throughout session. Pt also edu in ice and elevation for pt's swelling at home. Concentration on R LE quad and hamstring strengtheing. We added bridges and supine clams with red theraband  to pt's HEP. Continue skilled PT.    Personal Factors and Comorbidities Comorbidity 1    Comorbidities Previously mentioned arthroscopy s/p November 2021. , BPPV,  Type 2 DM, B foot OA    Examination-Activity Limitations Lift;Squat;Locomotion Level;Stairs;Stand    Examination-Participation Restrictions Community Activity;Cleaning    Stability/Clinical Decision Making Stable/Uncomplicated    Rehab Potential Good     PT Frequency 2x / week    PT Duration 6 weeks    PT Treatment/Interventions ADLs/Self Care Home Management;Moist Heat;Cryotherapy;Occupational psychologist;Therapeutic activities;Neuromuscular re-education;Therapeutic exercise;Balance training;Patient/family education;Vasopneumatic Device    PT Next Visit Plan Quadriceps and general LE strength and conditioning work    PT Home Exercise Plan Access Code: [email or web address]    Consulted and Agree with Plan of Care Patient           Patient will benefit from skilled therapeutic intervention in order to improve the following deficits and impairments:  Abnormal gait,Cardiopulmonary status limiting activity,Decreased activity tolerance,Decreased endurance,Decreased range of motion,Decreased mobility,Decreased strength,Difficulty walking,Hypomobility,Increased edema,Impaired flexibility,Pain  Visit Diagnosis: Difficulty walking  Muscle weakness (generalized)  Localized edema  Chronic pain of right knee  Stiffness of right knee, not elsewhere classified     Problem List Patient Active Problem List   Diagnosis Date Noted  . Alteration in blood pressure 06/11/2020  . Old complex tear of medial meniscus of right knee   . Old complex tear of lateral meniscus of right knee   . Primary osteoarthritis of both feet 12/13/2017  . Dehydration 09/09/2017  . Acute lower UTI 09/09/2017  . Nausea & vomiting 09/08/2017  . Primary osteoarthritis of both hands 04/05/2017  . Left carpal tunnel syndrome 11/15/2016  . Rheumatoid arthritis of multiple sites with negative rheumatoid factor ([place]) 10/06/2016  . High risk medication use 10/06/2016  . Dyslipidemia 10/06/2016  . Pain in both hands 09/08/2016  . Diabetes mellitus with coincident hypertension ([place]) 09/08/2016  . Type 2 diabetes mellitus  09/08/2016  . Gastroesophageal reflux disease without esophagitis 09/08/2016  . Osteopenia of multiple sites 09/08/2016  . History of  anxiety 09/08/2016  . Benign paroxysmal positional vertigo 09/08/2016  . Smoker 09/08/2016  . Sleep apnea 09/08/2016    Oretha Caprice, PT, MPT  08/18/2020, [facility] [address] [place], [place], [address] Phone: [phone]   Fax:  [phone]  Name: Annette Suarez MRN: [id number] Date of Birth: 16-Dec-1940

## 2020-08-20 ENCOUNTER — Encounter: Payer: Medicare Other | Admitting: Physical Therapy

## 2020-08-25 ENCOUNTER — Ambulatory Visit: Payer: Medicare Other | Admitting: Physical Therapy

## 2020-08-25 ENCOUNTER — Other Ambulatory Visit: Payer: Self-pay

## 2020-08-25 ENCOUNTER — Encounter: Payer: Self-pay | Admitting: Physical Therapy

## 2020-08-25 DIAGNOSIS — R262 Difficulty in walking, not elsewhere classified: Secondary | ICD-10-CM

## 2020-08-25 DIAGNOSIS — M6281 Muscle weakness (generalized): Secondary | ICD-10-CM

## 2020-08-25 DIAGNOSIS — M25561 Pain in right knee: Secondary | ICD-10-CM | POA: Diagnosis not present

## 2020-08-25 DIAGNOSIS — G8929 Other chronic pain: Secondary | ICD-10-CM

## 2020-08-25 DIAGNOSIS — R600 Localized edema: Secondary | ICD-10-CM

## 2020-08-25 NOTE — Therapy (Signed)
[facility] [place], [place], [address] Phone: [phone]   Fax:  [phone]  Physical Therapy Treatment  Patient Details  Name: Annette Suarez MRN: [id number] Date of Birth: [birth year] Referring Provider (PT): [name] MD   Encounter Date: 08/25/2020   PT End of Session - 08/25/20 1034    Visit Number 3    Number of Visits 12    PT Start Time 1020    PT Stop Time 3845    PT Time Calculation (min) 38 min    Activity Tolerance Patient tolerated treatment well;No increased pain;Patient limited by fatigue    Behavior During Therapy [facility] for tasks assessed/performed           Past Medical History:  Diagnosis Date  . Acute cystitis    UTI  . Anxiety   . Arthritis   . Benign positional vertigo   . Dermatitis   . Diabetes mellitus without complication ([place])   . Family history of adverse reaction to anesthesia   . GERD (gastroesophageal reflux disease)   . Goiter, nontoxic, multinodular   . Gout   . H/O: hysterectomy   . History of tobacco use   . Hx laparoscopic cholecystectomy   . Hypercholesterolemia   . Hypertension   . Incomplete bladder emptying   . Insomnia   . Postmenopausal atrophic vaginitis   . RA (rheumatoid arthritis) ([place])   . Rectocele   . Renal calculus   . Sleep apnea   . Thyroid condition   . Type 2 diabetes mellitus ([place])   . Urolithiasis   . Vaginal enterocele     Past Surgical History:  Procedure Laterality Date  . CARPAL TUNNEL RELEASE  2018  . CATARACT EXTRACTION, BILATERAL    . CHOLECYSTECTOMY    . COLONOSCOPY     07/11/2009, 02/2015  . GALLBLADDER SURGERY    . KNEE ARTHROSCOPY Right 05/20/2020   Procedure: RIGHT KNEE ARTHROSCOPY AND DEBRIDEMENT;  Surgeon: [name], MD;  Location: [place];  Service: Orthopedics;  Laterality: Right;  . PARTIAL HYSTERECTOMY     Overies left intact  . SQUAMOUS CELL CARCINOMA EXCISION  05/2018    There were no vitals filed for  this visit.   Subjective Assessment - 08/25/20 1028    Subjective Pt arriving today reporting being up more on her feet on Friday (going to grocery store, cleaning the house, cooking...etc) and pt reporting significant increase in pain on Saturday. Pt reporting 5/10 today.    Limitations Standing;Walking    Patient Stated Goals Reduce pain, edema and get stronger.    Currently in Pain? Yes    Pain Score 5     Pain Orientation Right    Pain Descriptors / Indicators Aching;Sore    Pain Type Chronic pain;Surgical pain    Pain Onset More than a month ago    Pain Frequency Intermittent                             [facility] Adult PT Treatment/Exercise - 08/25/20 0001      Knee/Hip Exercises: Aerobic   Recumbent Bike L2 6  minutes      Knee/Hip Exercises: Standing   Heel Raises Both;15 reps    Forward Step Up Right;10 reps;Hand Hold: 1;Step Height: 4"    Functional Squat Limitations mini squats x 10 with bilateral UE support and verbal instructions and tactile cues to correct technique  Knee/Hip Exercises: Seated   Long Arc Quad Strengthening;Both;3 sets;5 reps;Other (comment)    Long Arc Quad Limitations seated SLR 2x10    Other Seated Knee/Hip Exercises clams with red theraband 2x10    Sit to Sand 10 reps;without UE support      Knee/Hip Exercises: Supine   Bridges Strengthening;Right;15 reps    Straight Leg Raises Strengthening;Right;10 reps    Straight Leg Raises Limitations instructions to perform quad set prior to lifting                  PT Education - 08/25/20 1042    Education Details Additional HEP added and handout printed    Person(s) Educated Patient    Methods Explanation;Demonstration;Handout;Verbal cues    Comprehension Verbalized understanding;Returned demonstration            PT Short Term Goals - 08/25/20 1039      PT SHORT TERM GOAL #1   Title Annette Suarez will be independent with her starter long-term HEP.    Status On-going              PT Long Term Goals - 08/13/20 1744      PT LONG TERM GOAL #1   Title Annette Suarez will improve her self-reported FOTO score to 68.    Baseline 50    Time 6    Period Weeks    Status New    Target Date 10/03/20      PT LONG TERM GOAL #2   Title Improve B quadriceps strength to at least 4/5 MMT.    Baseline See objective.    Time 6    Period Weeks    Status New    Target Date 10/03/20      PT LONG TERM GOAL #3   Title Annette Suarez will report R knee pain consistently 0-3/10 on the Numeric Pain Rating Scale.    Baseline Can be 5+/10    Time 6    Period Weeks    Status New    Target Date 10/03/20      PT LONG TERM GOAL #4   Title Annette Suarez will report improved endurance with WB function (standing and walking) as assessed by FOTO and subjective self-report.    Baseline 50 FOTO    Time 6    Period Weeks    Status New    Target Date 10/03/20      PT LONG TERM GOAL #5   Title Annette Suarez will be independent with her long-term HEP at [place].    Time 6    Period Weeks    Status New    Target Date 10/03/20                 Plan - 08/25/20 1036    Clinical Impression Statement Pt arriving to therpay reporting increased pain and fatigue on R knee after being up on her feet on Friday. Pt reporting pain increased into Saturday. Pt reporting compliance with her HEP and riding her stationary bike. Pt is tolertaing exercises well. Pt deferring ice today and states she has been icing at home and doing voltaran gel three times each day. Pt's HEP was updated and handout issued.  Continue skilled PT.    Personal Factors and Comorbidities Comorbidity 1    Comorbidities Previously mentioned arthroscopy s/p November 2021. , BPPV, Type 2 DM, B foot OA    Examination-Activity Limitations Lift;Squat;Locomotion Level;Stairs;Stand    Examination-Participation Restrictions Community Activity;Cleaning    Stability/Clinical Decision Making Stable/Uncomplicated  Rehab Potential Good    PT Frequency 2x /  week    PT Treatment/Interventions ADLs/Self Care Home Management;Moist Heat;Cryotherapy;Occupational psychologist;Therapeutic activities;Neuromuscular re-education;Therapeutic exercise;Balance training;Patient/family education;Vasopneumatic Device    PT Next Visit Plan Quadriceps and general LE strength and conditioning work    PT Home Exercise Plan Access Code: [email or web address] (added: sit to stand, hip abd, hamstring curls, heel raises, step ups)    Consulted and Agree with Plan of Care Patient           Patient will benefit from skilled therapeutic intervention in order to improve the following deficits and impairments:  Abnormal gait,Cardiopulmonary status limiting activity,Decreased activity tolerance,Decreased endurance,Decreased range of motion,Decreased mobility,Decreased strength,Difficulty walking,Hypomobility,Increased edema,Impaired flexibility,Pain  Visit Diagnosis: Difficulty walking  Chronic pain of right knee  Muscle weakness (generalized)  Localized edema     Problem List Patient Active Problem List   Diagnosis Date Noted  . Alteration in blood pressure 06/11/2020  . Old complex tear of medial meniscus of right knee   . Old complex tear of lateral meniscus of right knee   . Primary osteoarthritis of both feet 12/13/2017  . Dehydration 09/09/2017  . Acute lower UTI 09/09/2017  . Nausea & vomiting 09/08/2017  . Primary osteoarthritis of both hands 04/05/2017  . Left carpal tunnel syndrome 11/15/2016  . Rheumatoid arthritis of multiple sites with negative rheumatoid factor ([place]) 10/06/2016  . High risk medication use 10/06/2016  . Dyslipidemia 10/06/2016  . Pain in both hands 09/08/2016  . Diabetes mellitus with coincident hypertension ([place]) 09/08/2016  . Type 2 diabetes mellitus  09/08/2016  . Gastroesophageal reflux disease without esophagitis 09/08/2016  . Osteopenia of multiple sites 09/08/2016  . History of anxiety 09/08/2016  .  Benign paroxysmal positional vertigo 09/08/2016  . Smoker 09/08/2016  . Sleep apnea 09/08/2016    Oretha Caprice, PT, MPT 08/25/2020, 10:55 AM  [facility] Physical Therapy [address] [place], [place], [address] Phone: [phone]   Fax:  [phone]  Name: AAVA DELAND MRN: [id number] Date of Birth: April 26, 1941

## 2020-08-26 ENCOUNTER — Ambulatory Visit (INDEPENDENT_AMBULATORY_CARE_PROVIDER_SITE_OTHER): Payer: Medicare Other | Admitting: Orthopedic Surgery

## 2020-08-26 ENCOUNTER — Encounter: Payer: Self-pay | Admitting: Orthopedic Surgery

## 2020-08-26 ENCOUNTER — Other Ambulatory Visit: Payer: Self-pay

## 2020-08-26 VITALS — BP 120/70 | HR 92 | Temp 97.3°F | Resp 20 | Ht 62.0 in | Wt 163.0 lb

## 2020-08-26 DIAGNOSIS — L659 Nonscarring hair loss, unspecified: Secondary | ICD-10-CM

## 2020-08-26 DIAGNOSIS — M0609 Rheumatoid arthritis without rheumatoid factor, multiple sites: Secondary | ICD-10-CM

## 2020-08-26 DIAGNOSIS — Z8659 Personal history of other mental and behavioral disorders: Secondary | ICD-10-CM

## 2020-08-26 DIAGNOSIS — E785 Hyperlipidemia, unspecified: Secondary | ICD-10-CM

## 2020-08-26 DIAGNOSIS — N898 Other specified noninflammatory disorders of vagina: Secondary | ICD-10-CM | POA: Diagnosis not present

## 2020-08-26 DIAGNOSIS — E118 Type 2 diabetes mellitus with unspecified complications: Secondary | ICD-10-CM | POA: Diagnosis not present

## 2020-08-26 DIAGNOSIS — E1169 Type 2 diabetes mellitus with other specified complication: Secondary | ICD-10-CM

## 2020-08-26 DIAGNOSIS — K219 Gastro-esophageal reflux disease without esophagitis: Secondary | ICD-10-CM | POA: Diagnosis not present

## 2020-08-26 DIAGNOSIS — R350 Frequency of micturition: Secondary | ICD-10-CM

## 2020-08-26 DIAGNOSIS — G4709 Other insomnia: Secondary | ICD-10-CM

## 2020-08-26 DIAGNOSIS — I10 Essential (primary) hypertension: Secondary | ICD-10-CM | POA: Diagnosis not present

## 2020-08-26 DIAGNOSIS — Z794 Long term (current) use of insulin: Secondary | ICD-10-CM | POA: Diagnosis not present

## 2020-08-26 LAB — POCT URINALYSIS DIPSTICK
Glucose, UA: POSITIVE — AB
Nitrite, UA: NEGATIVE
Protein, UA: POSITIVE — AB
Spec Grav, UA: 1.025 (ref 1.010–1.025)
Urobilinogen, UA: 0.2 E.U./dL
pH, UA: 6 (ref 5.0–8.0)

## 2020-08-26 MED ORDER — INSULIN ASPART PROT & ASPART (70-30 MIX) 100 UNIT/ML ~~LOC~~ SUSP: 36.0000 [IU] | Freq: Two times a day (BID) | SUBCUTANEOUS | 3 refills | Status: DC

## 2020-08-26 MED ORDER — ESTRADIOL 0.1 MG/GM VA CREA: 1.0000 | TOPICAL_CREAM | VAGINAL | 0 refills | Status: DC

## 2020-08-26 NOTE — Progress Notes (Signed)
Careteam: Patient Care Team: [name], NP as PCP - General (Adult Health Nurse Practitioner)  Seen by: Windell Moulding, AGNP-C  PLACE OF SERVICE:  [place] Directive information Does Patient Have a Medical Advance Directive?: Yes (Copy requested), Does patient want to make changes to medical advance directive?: No - Patient declined  Allergies  Allergen Reactions  . Amaryl [Glimepiride]     Don't Remember  . Codeine     SICK  . Jardiance [Empagliflozin]     Urinary tract infection- SIDE EFFECT  . Pneumovax 23 [Pneumococcal Vac Polyvalent]     Red / Swelling / Hot to touch SIDE EFFECTS  . Wellbutrin [Bupropion]     Unknown  . Latex Rash    Chief Complaint  Patient presents with  . Medical Management of Chronic Issues    2 Month Follow Up     HPI: Patient is a 80 y.o. female seen today for medical management of chronic conditions.   Continues to be followed by cardiology. Echo done recently. Taking losartan daily. Does not check blood pressure regularly. Avoids salt in diet. Denies chest pain, shortness of breath, lightheadedness or blurred vision.   She brought urine with her today. First noticed dysuria and urinary frequency 02/19. Denies blood in urine or fevers. She use to have them frequently and was followed by urology. Does not take cranberry supplement. Started drinking some of her husbands cranberry juice over the weekend. Today she is not having any dysuria, frequency has improved.   Still checking blood sugars daily. Bought blood sugar readings today. No recent hypoglycemic events. Average readings 180-260's. Currently on 34 units 70/30 insulin twice daily. Continues to limit breads and sugars on diet.   In the past 4-6 weeks she has noticed increased hair loss. Unsure if it is related to any medications she is taking. Purchased rogaine foam treatment and also herbal shampoo. Asking which on she should try. Started roagine about 3 weeks, ago.    Recently seen by Dr. Sharol Given for right knee pain. He recommended PT, started within the past month. Right knee pain 6/10 after activities and running errands. She is Icing knee and using Voltaren tid. Does not take Advil or tylenol. No recent falls  She is sleeping about 5-6 hours every night but having trouble falling asleep. Husband reports she stops breathing at night and snores.  Napping about 20-30 minutes daily. Taking melatonin 10 mg QHS. Takes ativan about 3 times a week. She declines having a sleep study done at this time since she will not use a CPAP, if needed.   Her husband and her are planning trip to mountains to see son soon.     Review of Systems:  Review of Systems  Constitutional: Negative for fever and malaise/fatigue.  HENT: Negative for hearing loss and sore throat.   Eyes: Negative for blurred vision and photophobia.       Reading glasses  Respiratory: Negative for cough, shortness of breath and wheezing.   Cardiovascular: Negative for chest pain and leg swelling.  Gastrointestinal: Positive for heartburn. Negative for abdominal pain, constipation, nausea and vomiting.  Genitourinary: Positive for dysuria and frequency.  Musculoskeletal: Positive for joint pain and myalgias.  Neurological: Negative for dizziness, weakness and headaches.  Psychiatric/Behavioral: Negative for depression. The patient is nervous/anxious and has insomnia.     Past Medical History:  Diagnosis Date  . Acute cystitis    UTI  . Anxiety   . Arthritis   .  Benign positional vertigo   . Dermatitis   . Diabetes mellitus without complication ([place])   . Family history of adverse reaction to anesthesia   . GERD (gastroesophageal reflux disease)   . Goiter, nontoxic, multinodular   . Gout   . H/O: hysterectomy   . History of tobacco use   . Hx laparoscopic cholecystectomy   . Hypercholesterolemia   . Hypertension   . Incomplete bladder emptying   . Insomnia   . Postmenopausal atrophic  vaginitis   . RA (rheumatoid arthritis) ([place])   . Rectocele   . Renal calculus   . Sleep apnea   . Thyroid condition   . Type 2 diabetes mellitus ([place])   . Urolithiasis   . Vaginal enterocele    Past Surgical History:  Procedure Laterality Date  . CARPAL TUNNEL RELEASE  2018  . CATARACT EXTRACTION, BILATERAL    . CHOLECYSTECTOMY    . COLONOSCOPY     07/11/2009, 02/2015  . GALLBLADDER SURGERY    . KNEE ARTHROSCOPY Right 05/20/2020   Procedure: RIGHT KNEE ARTHROSCOPY AND DEBRIDEMENT;  Surgeon: [name], MD;  Location: [place];  Service: Orthopedics;  Laterality: Right;  . PARTIAL HYSTERECTOMY     Overies left intact  . SQUAMOUS CELL CARCINOMA EXCISION  05/2018   Social History:   reports that she quit smoking about 42 years ago. Her smoking use included cigarettes. She has a 2.00 pack-year smoking history. She has never used smokeless tobacco. She reports that she does not drink alcohol and does not use drugs.  Family History  Problem Relation Age of Onset  . Diabetes Mother   . Heart disease Father 14  . Diabetes Son   . Crohn's disease Son     Medications: Patient's Medications  New Prescriptions   No medications on file  Previous Medications   ASPIRIN EC 81 MG TABLET    Take 81 mg by mouth daily.   DICLOFENAC SODIUM (VOLTAREN EX)    Apply 1 application topically 3 (three) times daily as needed.   ESTRADIOL (ESTRACE) 0.1 MG/GM VAGINAL CREAM    Place 1 Applicatorful vaginally 3 (three) times a week.   ETANERCEPT (ENBREL SURECLICK) 50 MG/ML INJECTION    Inject 50 mg into the skin once a week.   FLUOCINONIDE (LIDEX) 0.05 % EXTERNAL SOLUTION    Apply 1 application topically daily as needed.   IBUPROFEN (ADVIL) 200 MG TABLET    Take 200 mg by mouth as needed. For pain   INSULIN ASPART PROTAMINE- ASPART (NOVOLOG MIX 70/30) (70-30) 100 UNIT/ML INJECTION    Inject 34 Units into the skin 2 (two) times daily with a meal.   IRBESARTAN (AVAPRO) 300 MG TABLET     Take 1 tablet (300 mg total) by mouth daily.   LOPERAMIDE (IMODIUM A-D) 2 MG TABLET    Take 2 mg by mouth as needed for diarrhea or loose stools.   LORAZEPAM (ATIVAN) 0.5 MG TABLET    Take 1 tablet (0.5 mg total) by mouth at bedtime as needed for anxiety.   OMEPRAZOLE 20 MG TBDD    Take 20 mg by mouth daily.   ONDANSETRON (ZOFRAN) 4 MG TABLET    Take 4 mg by mouth 3 (three) times daily as needed for nausea or vomiting.   PROBIOTIC PRODUCT (PROBIOTIC PO)    Take by mouth daily.   SIMVASTATIN (ZOCOR) 20 MG TABLET    Take 1 tablet (20 mg total) by mouth every evening.  Modified Medications   No medications on file  Discontinued Medications   No medications on file    Physical Exam:  Vitals:   08/26/20 1123  BP: 120/70  Pulse: 92  Resp: 20  Temp: (!) 97.3 F (36.3 C)  TempSrc: Temporal  SpO2: 97%  Weight: 163 lb (73.9 kg)  Height: 5\' 2"  (1.575 m)   Body mass index is 29.81 kg/m. Wt Readings from Last 3 Encounters:  08/26/20 163 lb (73.9 kg)  07/14/20 152 lb 3.2 oz (69 kg)  06/30/20 158 lb (71.7 kg)    Physical Exam Vitals reviewed.  Constitutional:      General: She is not in acute distress. HENT:     Head: Normocephalic.      Right Ear: There is no impacted cerumen.     Left Ear: There is no impacted cerumen.     Nose: Nose normal.     Mouth/Throat:     Mouth: Mucous membranes are moist.  Eyes:     General:        Right eye: No discharge.        Left eye: No discharge.     Conjunctiva/sclera: Conjunctivae normal.     Pupils: Pupils are equal, round, and reactive to light.  Neck:     Thyroid: Thyromegaly present. No thyroid mass.  Cardiovascular:     Rate and Rhythm: Normal rate and regular rhythm.     Pulses: Normal pulses.     Heart sounds: Normal heart sounds.  Pulmonary:     Effort: Pulmonary effort is normal. No respiratory distress.     Breath sounds: Normal breath sounds. No wheezing.  Abdominal:     General: Abdomen is flat. Bowel sounds are normal.  There is no distension.     Palpations: Abdomen is soft.     Tenderness: There is no abdominal tenderness.  Musculoskeletal:     Cervical back: Normal range of motion.     Right knee: Swelling present. Normal range of motion. Tenderness present over the MCL.     Right lower leg: No edema.     Left lower leg: No edema.  Lymphadenopathy:     Cervical: No cervical adenopathy.  Skin:    General: Skin is warm and dry.     Capillary Refill: Capillary refill takes less than 2 seconds.  Neurological:     General: No focal deficit present.     Mental Status: She is alert and oriented to person, place, and time.  Psychiatric:        Mood and Affect: Mood normal.        Behavior: Behavior normal.    Labs reviewed: Basic Metabolic Panel: Recent Labs    01/22/20 0000 03/14/20 1123 05/16/20 1000 06/03/20 0000 06/11/20 1036  NA  --    < > 138 140 141  K  --    < > 4.5 4.2 4.6  CL  --    < > 102 102 101  CO2  --    < > 27 27 24   GLUCOSE  --    < > 236* 182* 169*  BUN  --    < > 13 20 16   CREATININE  --    < > 0.88 1.06* 0.97  CALCIUM  --    < > 9.2 9.5 9.4  TSH 2.25  --   --  1.67  --    < > = values in this interval not displayed.   Liver Function  Tests: Recent Labs    12/31/19 1156 03/14/20 1123 06/03/20 0000  AST 11 9* 12  ALT 13 9 10   BILITOT 1.1 1.0 1.0  PROT 6.7 6.7 7.3   No results for input(s): LIPASE, AMYLASE in the last 8760 hours. No results for input(s): AMMONIA in the last 8760 hours. CBC: Recent Labs    12/31/19 1156 03/14/20 1123 06/03/20 0000  WBC 9.0 13.1* 13.1*  NEUTROABS 5,652 9,367* 9,406*  HGB 12.9 13.5 13.7  HCT 37.7 40.1 39.6  MCV 91.5 92.6 91.9  PLT 292 357 386   Lipid Panel: Recent Labs    01/22/20 0000  CHOL 126  HDL 44  LDLCALC 55  TRIG 164*   TSH: Recent Labs    01/22/20 0000 06/03/20 0000  TSH 2.25 1.67   A1C: Lab Results  Component Value Date   HGBA1C 8.0 (H) 06/03/2020     Assessment/Plan 1. Urinary frequency -  dysuria and frequency have improved since initial onset, afebrile - history of frequent UTI in past- followed by urology - Urinalysis - Culture, Urine  2. Type 2 diabetes mellitus with complication, with long-term current use of insulin (HCC) - sugars continue to be elevated, 180-260's, no hypoglycemic events - will increase insulin to 36 units 70/30 bid - continue to limit carbs and sugars from diet - cont ARB, statin, asa and 70/30 - a1c- future - insulin aspart protamine- aspart (NOVOLOG MIX 70/30) (70-30) 100 UNIT/ML injection; Inject 0.36 mLs (36 Units total) into the skin 2 (two) times daily with a meal.  Dispense: 10 mL; Refill: 3  3. Vaginal dryness - stable with medication - estradiol (ESTRACE) 0.1 MG/GM vaginal cream; Place 1 Applicatorful vaginally 3 (three) times a week.  Dispense: 42.5 g; Refill: 0  4. Hair loss - no bald spots - suspect hair thinning due to age - cont rogaine foam  5. Essential hypertension - bp at goal < 150/90 - cont losartan - cont to limit sodium from diet < 2000 mg /day  6. Gastroesophageal reflux disease without esophagitis - stable with omeprazole  7. Hyperlipidemia associated with type 2 diabetes mellitus ([place]) - stable with statin - lipid panel- future  8. History of anxiety - no recent panic attack - cont prn ativan  9. Rheumatoid arthritis of multiple sites with negative rheumatoid factor (HCC) - ongoing, right knee pain still bothersome - followed by Dr. Estanislado Pandy and Dr. Sharol Given - cont PT - cont Enbrel - cont to ice knee a few times daily - cont Voltaren gel tid  10. Other insomnia - not interested in sleep study since she refuses to use CPAP if recommended - sleeping about 5-6 hours with 1 nap daily - cont melatonin 10 mg po QHS   I provided 35 minutes of face-to-face time during this encounter.    Next appt: 11/25/2020 Windell Moulding, [facility] [phone]

## 2020-08-26 NOTE — Patient Instructions (Signed)
Please increase insulin to 36 units.  Follow up in 3 months to check a1c     Urinary Tract Infection, Adult A urinary tract infection (UTI) is an infection of any part of the urinary tract. The urinary tract includes:  The kidneys.  The ureters.  The bladder.  The urethra. These organs make, store, and get rid of pee (urine) in the body. What are the causes? This infection is caused by germs (bacteria) in your genital area. These germs grow and cause swelling (inflammation) of your urinary tract. What increases the risk? The following factors may make you more likely to develop this condition:  Using a small, thin tube (catheter) to drain pee.  Not being able to control when you pee or poop (incontinence).  Being female. If you are female, these things can increase the risk: ? Using these methods to prevent pregnancy:  A medicine that kills sperm (spermicide).  A device that blocks sperm (diaphragm). ? Having low levels of a female hormone (estrogen). ? Being pregnant. You are more likely to develop this condition if:  You have genes that add to your risk.  You are sexually active.  You take antibiotic medicines.  You have trouble peeing because of: ? A prostate that is bigger than normal, if you are female. ? A blockage in the part of your body that drains pee from the bladder. ? A kidney stone. ? A nerve condition that affects your bladder. ? Not getting enough to drink. ? Not peeing often enough.  You have other conditions, such as: ? Diabetes. ? A weak disease-fighting system (immune system). ? Sickle cell disease. ? Gout. ? Injury of the spine. What are the signs or symptoms? Symptoms of this condition include:  Needing to pee right away.  Peeing small amounts often.  Pain or burning when peeing.  Blood in the pee.  Pee that smells bad or not like normal.  Trouble peeing.  Pee that is cloudy.  Fluid coming from the vagina, if you are  female.  Pain in the belly or lower back. Other symptoms include:  Vomiting.  Not feeling hungry.  Feeling mixed up (confused). This may be the first symptom in older adults.  Being tired and grouchy (irritable).  A fever.  Watery poop (diarrhea). How is this treated?  Taking antibiotic medicine.  Taking other medicines.  Drinking enough water. In some cases, you may need to see a specialist. Follow these instructions at home: Medicines  Take over-the-counter and prescription medicines only as told by your doctor.  If you were prescribed an antibiotic medicine, take it as told by your doctor. Do not stop taking it even if you start to feel better. General instructions  Make sure you: ? Pee until your bladder is empty. ? Do not hold pee for a long time. ? Empty your bladder after sex. ? Wipe from front to back after peeing or pooping if you are a female. Use each tissue one time when you wipe.  Drink enough fluid to keep your pee pale yellow.  Keep all follow-up visits.   Contact a doctor if:  You do not get better after 1-2 days.  Your symptoms go away and then come back. Get help right away if:  You have very bad back pain.  You have very bad pain in your lower belly.  You have a fever.  You have chills.  You feeling like you will vomit or you vomit. Summary  A urinary  tract infection (UTI) is an infection of any part of the urinary tract.  This condition is caused by germs in your genital area.  There are many risk factors for a UTI.  Treatment includes antibiotic medicines.  Drink enough fluid to keep your pee pale yellow. This information is not intended to replace advice given to you by your health care provider. Make sure you discuss any questions you have with your health care provider. Document Revised: 02/01/2020 Document Reviewed: 02/01/2020 Elsevier Patient Education  [place].

## 2020-08-26 NOTE — Progress Notes (Signed)
a 

## 2020-08-27 ENCOUNTER — Ambulatory Visit: Payer: Medicare Other | Admitting: Rehabilitative and Restorative Service Providers"

## 2020-08-27 ENCOUNTER — Encounter: Payer: Self-pay | Admitting: Rehabilitative and Restorative Service Providers"

## 2020-08-27 DIAGNOSIS — M25561 Pain in right knee: Secondary | ICD-10-CM | POA: Diagnosis not present

## 2020-08-27 DIAGNOSIS — R262 Difficulty in walking, not elsewhere classified: Secondary | ICD-10-CM

## 2020-08-27 DIAGNOSIS — M6281 Muscle weakness (generalized): Secondary | ICD-10-CM | POA: Diagnosis not present

## 2020-08-27 DIAGNOSIS — M25661 Stiffness of right knee, not elsewhere classified: Secondary | ICD-10-CM | POA: Diagnosis not present

## 2020-08-27 DIAGNOSIS — R600 Localized edema: Secondary | ICD-10-CM

## 2020-08-27 DIAGNOSIS — G8929 Other chronic pain: Secondary | ICD-10-CM | POA: Diagnosis not present

## 2020-08-27 NOTE — Therapy (Signed)
[place], [place], [address] Phone: [phone]   Fax:  [phone]  Physical Therapy Treatment  Patient Details  Name: Annette Suarez MRN: [id number] Date of Birth: [birth year] Referring Provider (PT): [name] MD   Encounter Date: 08/27/2020   PT End of Session - 08/27/20 1411    Visit Number 4    Number of Visits 12    PT Start Time 7622    PT Stop Time 1100    PT Time Calculation (min) 45 min    Activity Tolerance Patient tolerated treatment well;No increased pain;Patient limited by fatigue    Behavior During Therapy [facility] for tasks assessed/performed           Past Medical History:  Diagnosis Date  . Acute cystitis    UTI  . Anxiety   . Arthritis   . Benign positional vertigo   . Dermatitis   . Diabetes mellitus without complication ([place])   . Family history of adverse reaction to anesthesia   . GERD (gastroesophageal reflux disease)   . Goiter, nontoxic, multinodular   . Gout   . H/O: hysterectomy   . History of tobacco use   . Hx laparoscopic cholecystectomy   . Hypercholesterolemia   . Hypertension   . Incomplete bladder emptying   . Insomnia   . Postmenopausal atrophic vaginitis   . RA (rheumatoid arthritis) ([place])   . Rectocele   . Renal calculus   . Sleep apnea   . Thyroid condition   . Type 2 diabetes mellitus ([place])   . Urolithiasis   . Vaginal enterocele     Past Surgical History:  Procedure Laterality Date  . CARPAL TUNNEL RELEASE  2018  . CATARACT EXTRACTION, BILATERAL    . CHOLECYSTECTOMY    . COLONOSCOPY     07/11/2009, 02/2015  . GALLBLADDER SURGERY    . KNEE ARTHROSCOPY Right 05/20/2020   Procedure: RIGHT KNEE ARTHROSCOPY AND DEBRIDEMENT;  Surgeon: [name], MD;  Location: [place];  Service: Orthopedics;  Laterality: Right;  . PARTIAL HYSTERECTOMY     Overies left intact  . SQUAMOUS CELL CARCINOMA EXCISION  05/2018    There were no vitals filed for  this visit.   Subjective Assessment - 08/27/20 1038    Subjective Aanya reports little problems or swelling in the AM.  Late day swelling and pain with WB limit function.    Limitations Standing;Walking    How long can you sit comfortably? NA.  Stiffness with prolonged sitting.    How long can you stand comfortably? ~ 3 hours    How long can you walk comfortably? 5-10 minutes    Patient Stated Goals Reduce pain, edema and get stronger.    Currently in Pain? Yes    Pain Score 3     Pain Location Knee    Pain Orientation Right    Pain Descriptors / Indicators Aching;Sore    Pain Type Chronic pain;Surgical pain    Pain Onset More than a month ago    Pain Frequency Intermittent    Aggravating Factors  WB and prolonged postures    Pain Relieving Factors Ice and rest    Effect of Pain on Daily Activities Limits endurance with prolonged weight-bearing and afternoon function.    Multiple Pain Sites No  [facility] Adult PT Treatment/Exercise - 08/27/20 0001      Therapeutic Activites    Therapeutic Activities Other Therapeutic Activities    Other Therapeutic Activities Step-down 2" step and step-up and over 2" to/from 4" step      Exercises   Exercises Knee/Hip      Knee/Hip Exercises: Machines for Strengthening   Cybex Leg Press 2 sets of 10 @ 87# double leg and 2 sets of 10 @ 50# single leg      Knee/Hip Exercises: Seated   Long Arc Quad Strengthening;Both;3 sets;5 reps;Other (comment)    Long Arc Quad Limitations Seated straight leg raises    Sit to [employer] 5 reps;with UE support;Other (comment)   slow eccentrics                 PT Education - 08/27/20 1409    Education Details Continue current HEP with emphasis on NWB seated straight leg raises to reduce edema and improve quadriceps strength.    Person(s) Educated Patient    Methods Explanation;Demonstration;Verbal cues;Tactile cues    Comprehension Tactile cues  required;Verbalized understanding;Returned demonstration;Verbal cues required;Need further instruction            PT Short Term Goals - 08/27/20 1410      PT SHORT TERM GOAL #1   Title Irini will be independent with her starter long-term HEP.    Status On-going             PT Long Term Goals - 08/27/20 1410      PT LONG TERM GOAL #1   Title Ski will improve her self-reported FOTO score to 68.    Baseline 50    Time 6    Period Weeks    Status On-going      PT LONG TERM GOAL #2   Title Improve B quadriceps strength to at least 4/5 MMT.    Baseline See objective.    Time 6    Period Weeks    Status On-going      PT LONG TERM GOAL #3   Title Camera will report R knee pain consistently 0-3/10 on the Numeric Pain Rating Scale.    Baseline Can be 5+/10    Time 6    Period Weeks    Status On-going      PT LONG TERM GOAL #4   Title Rhiley will report improved endurance with WB function (standing and walking) as assessed by FOTO and subjective self-report.    Baseline 50 FOTO    Time 6    Period Weeks    Status On-going      PT LONG TERM GOAL #5   Title Robin will be independent with her long-term HEP at DC.    Time 6    Period Weeks    Status On-going                 Plan - 08/27/20 1411    Clinical Impression Statement Shareeka reports "fair" HEP compliance.  30-50 seated straight leg raises was reinforced to improve quadriceps strength, reduce edema and prepare Delmi for more weight-bearing activities.  With consistent HEP compliance and attendance in PT, Linna's prognosis to meet long-term goals is good.    Personal Factors and Comorbidities Comorbidity 1    Comorbidities Previously mentioned arthroscopy s/p November 2021. , BPPV, Type 2 DM, B foot OA    Examination-Activity Limitations Lift;Squat;Locomotion Level;Stairs;Stand    Examination-Participation Restrictions Community Activity;Cleaning    Stability/Clinical Decision Making Stable/Uncomplicated  Rehab Potential Good    PT Frequency 2x / week    PT Treatment/Interventions ADLs/Self Care Home Management;Moist Heat;Cryotherapy;Occupational psychologist;Therapeutic activities;Neuromuscular re-education;Therapeutic exercise;Balance training;Patient/family education;Vasopneumatic Device    PT Next Visit Plan Quadriceps and general LE strength and conditioning work    PT Home Exercise Plan Access Code: [email or web address] (added: sit to stand, hip abd, hamstring curls, heel raises, step ups)    Consulted and Agree with Plan of Care Patient           Patient will benefit from skilled therapeutic intervention in order to improve the following deficits and impairments:  Abnormal gait,Cardiopulmonary status limiting activity,Decreased activity tolerance,Decreased endurance,Decreased range of motion,Decreased mobility,Decreased strength,Difficulty walking,Hypomobility,Increased edema,Impaired flexibility,Pain  Visit Diagnosis: Difficulty walking  Muscle weakness (generalized)  Chronic pain of right knee  Stiffness of right knee, not elsewhere classified  Localized edema     Problem List Patient Active Problem List   Diagnosis Date Noted  . Alteration in blood pressure 06/11/2020  . Old complex tear of medial meniscus of right knee   . Old complex tear of lateral meniscus of right knee   . Primary osteoarthritis of both feet 12/13/2017  . Dehydration 09/09/2017  . Acute lower UTI 09/09/2017  . Nausea & vomiting 09/08/2017  . Primary osteoarthritis of both hands 04/05/2017  . Left carpal tunnel syndrome 11/15/2016  . Rheumatoid arthritis of multiple sites with negative rheumatoid factor ([place]) 10/06/2016  . High risk medication use 10/06/2016  . Dyslipidemia 10/06/2016  . Pain in both hands 09/08/2016  . Diabetes mellitus with coincident hypertension ([place]) 09/08/2016  . Type 2 diabetes mellitus  09/08/2016  . Gastroesophageal reflux disease without esophagitis  09/08/2016  . Osteopenia of multiple sites 09/08/2016  . History of anxiety 09/08/2016  . Benign paroxysmal positional vertigo 09/08/2016  . Smoker 09/08/2016  . Sleep apnea 09/08/2016    Farley Ly PT, MPT 08/27/2020, 2:14 PM  [facility] Physical Therapy [address] [place], [place], [address] Phone: [phone]   Fax:  [phone]  Name: AIDALY CORDNER MRN: [id number] Date of Birth: 06-04-1941

## 2020-08-28 ENCOUNTER — Other Ambulatory Visit: Payer: Self-pay | Admitting: Orthopedic Surgery

## 2020-08-28 ENCOUNTER — Other Ambulatory Visit: Payer: Self-pay | Admitting: *Deleted

## 2020-08-28 DIAGNOSIS — N3000 Acute cystitis without hematuria: Secondary | ICD-10-CM

## 2020-08-28 DIAGNOSIS — R350 Frequency of micturition: Secondary | ICD-10-CM

## 2020-08-28 LAB — URINALYSIS
Bilirubin Urine: NEGATIVE
Hgb urine dipstick: NEGATIVE
Ketones, ur: NEGATIVE
Nitrite: NEGATIVE
Protein, ur: NEGATIVE
Specific Gravity, Urine: 1.022 (ref 1.001–1.03)
pH: <=5 (ref 5.0–8.0)

## 2020-08-28 LAB — URINE CULTURE
MICRO NUMBER:: 11565033
SPECIMEN QUALITY:: ADEQUATE

## 2020-08-28 MED ORDER — ACCU-CHEK GUIDE VI STRP: ORAL_STRIP | 1 refills | Status: DC

## 2020-08-28 MED ORDER — ACCU-CHEK SOFTCLIX LANCETS MISC: 1 refills | Status: DC

## 2020-08-28 MED ORDER — CIPROFLOXACIN HCL 500 MG PO TABS: 500.0000 mg | ORAL_TABLET | Freq: Two times a day (BID) | ORAL | 0 refills | Status: DC

## 2020-08-28 NOTE — Progress Notes (Signed)
cipro

## 2020-08-28 NOTE — Telephone Encounter (Signed)
Received refill Request from [employer].

## 2020-09-01 ENCOUNTER — Ambulatory Visit: Payer: Medicare Other | Admitting: Physical Therapy

## 2020-09-01 ENCOUNTER — Other Ambulatory Visit: Payer: Self-pay

## 2020-09-01 ENCOUNTER — Encounter: Payer: Self-pay | Admitting: Physical Therapy

## 2020-09-01 DIAGNOSIS — R262 Difficulty in walking, not elsewhere classified: Secondary | ICD-10-CM

## 2020-09-01 DIAGNOSIS — M25561 Pain in right knee: Secondary | ICD-10-CM

## 2020-09-01 DIAGNOSIS — M25661 Stiffness of right knee, not elsewhere classified: Secondary | ICD-10-CM

## 2020-09-01 DIAGNOSIS — G8929 Other chronic pain: Secondary | ICD-10-CM | POA: Diagnosis not present

## 2020-09-01 DIAGNOSIS — M6281 Muscle weakness (generalized): Secondary | ICD-10-CM | POA: Diagnosis not present

## 2020-09-01 DIAGNOSIS — R600 Localized edema: Secondary | ICD-10-CM | POA: Diagnosis not present

## 2020-09-01 NOTE — Therapy (Signed)
[place], [place], [address] Phone: [phone]   Fax:  [phone]  Physical Therapy Treatment  Patient Details  Name: GRIZEL VESELY MRN: [id number] Date of Birth: 1940/10/22 Referring Provider (PT): [name] MD   Encounter Date: 09/01/2020   PT End of Session - 09/01/20 1030    Visit Number 5    Number of Visits 12    PT Start Time 1020    PT Stop Time 1100    PT Time Calculation (min) 40 min    Activity Tolerance Patient tolerated treatment well;No increased pain;Patient limited by fatigue    Behavior During Therapy [facility] for tasks assessed/performed           Past Medical History:  Diagnosis Date  . Acute cystitis    UTI  . Anxiety   . Arthritis   . Benign positional vertigo   . Dermatitis   . Diabetes mellitus without complication ([place])   . Family history of adverse reaction to anesthesia   . GERD (gastroesophageal reflux disease)   . Goiter, nontoxic, multinodular   . Gout   . H/O: hysterectomy   . History of tobacco use   . Hx laparoscopic cholecystectomy   . Hypercholesterolemia   . Hypertension   . Incomplete bladder emptying   . Insomnia   . Postmenopausal atrophic vaginitis   . RA (rheumatoid arthritis) ([place])   . Rectocele   . Renal calculus   . Sleep apnea   . Thyroid condition   . Type 2 diabetes mellitus ([place])   . Urolithiasis   . Vaginal enterocele     Past Surgical History:  Procedure Laterality Date  . CARPAL TUNNEL RELEASE  2018  . CATARACT EXTRACTION, BILATERAL    . CHOLECYSTECTOMY    . COLONOSCOPY     07/11/2009, 02/2015  . GALLBLADDER SURGERY    . KNEE ARTHROSCOPY Right 05/20/2020   Procedure: RIGHT KNEE ARTHROSCOPY AND DEBRIDEMENT;  Surgeon: [name], MD;  Location: [facility];  Service: Orthopedics;  Laterality: Right;  . PARTIAL HYSTERECTOMY     Overies left intact  . SQUAMOUS CELL CARCINOMA EXCISION  05/2018    There were no vitals filed for  this visit.   Subjective Assessment - 09/01/20 1025    Subjective Pt arriving today reporting 5/10 pain in R knee on inside.    How long can you sit comfortably? NA.  Stiffness with prolonged sitting.    Patient Stated Goals Reduce pain, edema and get stronger.    Currently in Pain? Yes    Pain Score 5     Pain Location Knee    Pain Orientation Right    Pain Descriptors / Indicators Aching;Sore    Pain Type Chronic pain;Surgical pain    Pain Onset More than a month ago                             [facility] Adult PT Treatment/Exercise - 09/01/20 0001      Exercises   Exercises Knee/Hip      Knee/Hip Exercises: Stretches   Gastroc Stretch 3 reps;20 seconds      Knee/Hip Exercises: Aerobic   Recumbent Bike L3 x 6 minutes      Knee/Hip Exercises: Machines for Strengthening   Cybex Leg Press 2 sets of 10 @ 87# double leg and 2 sets of 10 @ 50# single leg  Knee/Hip Exercises: Standing   Other Standing Knee Exercises standing on [employer]: feet together, feet apart x 1 minute each      Knee/Hip Exercises: Seated   Long Arc Quad Limitations Seated straight leg raises 3x10    Sit to Sand without UE support;15 reps                  PT Education - 09/01/20 1028    Education Details Discussed elevation and ice as needed.    Person(s) Educated Patient    Methods Explanation    Comprehension Verbalized understanding            PT Short Term Goals - 09/01/20 1036      PT SHORT TERM GOAL #1   Title Libby will be independent with her starter long-term HEP.    Status Achieved             PT Long Term Goals - 09/01/20 1041      PT LONG TERM GOAL #1   Title Nailea will improve her self-reported FOTO score to 68.    Status On-going      PT LONG TERM GOAL #2   Title Improve B quadriceps strength to at least 4/5 MMT.    Status On-going      PT LONG TERM GOAL #3   Title Darnelle will report R knee pain consistently 0-3/10 on the Numeric Pain Rating  Scale.    Baseline Pt stating pain can increase to >/= 5/10 at times    Status On-going      PT LONG TERM GOAL #4   Title Fradel will report improved endurance with WB function (standing and walking) as assessed by FOTO and subjective self-report.    Status On-going      PT LONG TERM GOAL #5   Title Jenelle will be independent with her long-term HEP at DC.    Status On-going                 Plan - 09/01/20 1033    Clinical Impression Statement Pt arriving to therapy reporting 5/10 pain in right medial knee. Pt stated she is wearing a supportive knee brace on her R knee for stability and comfort.  Pt progressing with strengthening and dynamic balance. Pt reporting she was unable to perform 50 reps of the seated SLR and states she performs 10 reps several times each day. We discussed and demonstrated several ways to improve pt's R knee flexion. Pt edu in heel to toe gait pattern to elicit terminal knee extension of R LE.  Continue skilled PT.    Personal Factors and Comorbidities Comorbidity 1    Comorbidities Previously mentioned arthroscopy s/p November 2021. , BPPV, Type 2 DM, B foot OA    Examination-Activity Limitations Lift;Squat;Locomotion Level;Stairs;Stand    Examination-Participation Restrictions Community Activity;Cleaning    Stability/Clinical Decision Making Stable/Uncomplicated    Rehab Potential Good    PT Frequency 2x / week    PT Duration 6 weeks    PT Treatment/Interventions ADLs/Self Care Home Management;Moist Heat;Cryotherapy;Occupational psychologist;Therapeutic activities;Neuromuscular re-education;Therapeutic exercise;Balance training;Patient/family education;Vasopneumatic Device    PT Next Visit Plan Quadriceps and general LE strength and conditioning work    PT Home Exercise Plan Access Code: [email or web address] (added: sit to stand, hip abd, hamstring curls, heel raises, step ups)    Consulted and Agree with Plan of Care Patient            Patient will benefit from skilled therapeutic intervention in  order to improve the following deficits and impairments:  Abnormal gait,Cardiopulmonary status limiting activity,Decreased activity tolerance,Decreased endurance,Decreased range of motion,Decreased mobility,Decreased strength,Difficulty walking,Hypomobility,Increased edema,Impaired flexibility,Pain  Visit Diagnosis: Difficulty walking  Muscle weakness (generalized)  Chronic pain of right knee  Stiffness of right knee, not elsewhere classified  Localized edema     Problem List Patient Active Problem List   Diagnosis Date Noted  . Alteration in blood pressure 06/11/2020  . Old complex tear of medial meniscus of right knee   . Old complex tear of lateral meniscus of right knee   . Primary osteoarthritis of both feet 12/13/2017  . Dehydration 09/09/2017  . Acute lower UTI 09/09/2017  . Nausea & vomiting 09/08/2017  . Primary osteoarthritis of both hands 04/05/2017  . Left carpal tunnel syndrome 11/15/2016  . Rheumatoid arthritis of multiple sites with negative rheumatoid factor ([place]) 10/06/2016  . High risk medication use 10/06/2016  . Dyslipidemia 10/06/2016  . Pain in both hands 09/08/2016  . Diabetes mellitus with coincident hypertension ([place]) 09/08/2016  . Type 2 diabetes mellitus  09/08/2016  . Gastroesophageal reflux disease without esophagitis 09/08/2016  . Osteopenia of multiple sites 09/08/2016  . History of anxiety 09/08/2016  . Benign paroxysmal positional vertigo 09/08/2016  . Smoker 09/08/2016  . Sleep apnea 09/08/2016    Oretha Caprice, PT, MPT 09/01/2020, 11:02 AM  [facility] Physical Therapy [address] [place], [place], [address] Phone: [phone]   Fax:  [phone]  Name: RAYNAH GOMES MRN: [id number] Date of Birth: [birth year]

## 2020-09-03 ENCOUNTER — Encounter: Payer: Medicare Other | Admitting: Physical Therapy

## 2020-09-03 ENCOUNTER — Telehealth: Payer: Self-pay | Admitting: Physical Therapy

## 2020-09-03 NOTE — Telephone Encounter (Signed)
I spoke with pt after she missed her 10:15 appointment today (09/03/2020). Pt reported she didn't have it the session on her calendar. Pt also reporting increased pain on the inside of her right knee. Pt concerned and unable to pinpoint specific cause of her pain.  Pt reminded of her upcoming appointment with Dr. Sharol Given and other scheduled therapy appointments.   Kearney Hard, PT, MPT 09/03/20 10:56 AM

## 2020-09-08 ENCOUNTER — Telehealth: Payer: Self-pay | Admitting: Orthopedic Surgery

## 2020-09-08 ENCOUNTER — Encounter: Payer: Self-pay | Admitting: Orthopedic Surgery

## 2020-09-08 ENCOUNTER — Ambulatory Visit: Payer: Medicare Other | Admitting: Orthopedic Surgery

## 2020-09-08 DIAGNOSIS — M1711 Unilateral primary osteoarthritis, right knee: Secondary | ICD-10-CM

## 2020-09-08 NOTE — Progress Notes (Signed)
Office Visit Note   Patient: Annette Suarez           Date of Birth: 04-05-1941           MRN: [id number] Visit Date: 09/08/2020              Requested by: [name], NP [phone],  [place] [address] PCP: [name], NP  Chief Complaint  Patient presents with  . Right Knee - Routine Post Op, Pain      HPI: Patient is a 80 year old woman who presents about 4 months out from right knee arthroscopy with debridement.  Patient had osteochondral defect of the medial femoral condyle as well as meniscal tears.  Patient states she has been doing physical therapy is currently doing home exercises she states that these have not helped her symptoms.  She states that she is also using Voltaren gel.  She states her pain is primarily over the medial joint line.  Patient states she is also using a elastic sleeve over her knee.  Patient states that her hemoglobin A1c runs from about 7-8.  Assessment & Plan: Visit Diagnoses:  1. Unilateral primary osteoarthritis, right knee     Plan: Discussed the nature of the arthritis of her knee recommend continued conservative therapy with strengthening Voltaren gel and getting her hemoglobin A1c consistently below 8.  Discussed that if she fails conservative treatment the next option would be to proceed with a total knee arthroplasty discussed the increased risks of infection with an elevated hemoglobin A1c.  Patient states she is not interested in a total knee arthroplasty at this time.  Follow-Up Instructions: Return in about 4 weeks (around 10/06/2020).   Ortho Exam  Patient is alert, oriented, no adenopathy, well-dressed, normal affect, normal respiratory effort. Review of the radiographs shows essentially bone-on-bone contact medial joint line of the right knee patient is still tender to palpation over the medial joint line she does have a mild effusion.  Collaterals are cruciates are stable there is no redness no cellulitis no signs of  infection.  Imaging: No results found. No images are attached to the encounter.  Labs: Lab Results  Component Value Date   HGBA1C 8.0 (H) 06/03/2020   HGBA1C 7.5 01/22/2020   HGBA1C 8.1 11/26/2019   ESRSEDRATE 32 (H) 09/08/2016   LABURIC 5.7 09/12/2017     Lab Results  Component Value Date   ALBUMIN 4.6 07/25/2019   ALBUMIN 3.2 (L) 09/09/2017   ALBUMIN 3.9 09/08/2017   LABURIC 5.7 09/12/2017    Lab Results  Component Value Date   MG 1.2 (L) 09/09/2017   No results found for: VD25OH  No results found for: PREALBUMIN CBC EXTENDED Latest Ref Rng & Units 06/03/2020 03/14/2020 12/31/2019  WBC 3.8 - 10.8 Thousand/uL 13.1(H) 13.1(H) 9.0  RBC 3.80 - 5.10 Million/uL 4.31 4.33 4.12  HGB 11.7 - 15.5 g/dL 13.7 13.5 12.9  HCT 35.0 - 45.0 % 39.6 40.1 37.7  PLT 140 - 400 Thousand/uL 386 357 292  NEUTROABS 1,500 - 7,800 cells/uL 9,406(H) 9,367(H) 5,652  LYMPHSABS 850 - 3,900 cells/uL 2,856 2,764 2,376     There is no height or weight on file to calculate BMI.  Orders:  No orders of the defined types were placed in this encounter.  No orders of the defined types were placed in this encounter.    Procedures: No procedures performed  Clinical Data: No additional findings.  ROS:  All other systems negative, except  as noted in the HPI. Review of Systems  Objective: Vital Signs: There were no vitals taken for this visit.  Specialty Comments:  No specialty comments available.  PMFS History: Patient Active Problem List   Diagnosis Date Noted  . Alteration in blood pressure 06/11/2020  . Old complex tear of medial meniscus of right knee   . Old complex tear of lateral meniscus of right knee   . Primary osteoarthritis of both feet 12/13/2017  . Dehydration 09/09/2017  . Acute lower UTI 09/09/2017  . Nausea & vomiting 09/08/2017  . Primary osteoarthritis of both hands 04/05/2017  . Left carpal tunnel syndrome 11/15/2016  . Rheumatoid arthritis of multiple sites with  negative rheumatoid factor ([place]) 10/06/2016  . High risk medication use 10/06/2016  . Dyslipidemia 10/06/2016  . Pain in both hands 09/08/2016  . Diabetes mellitus with coincident hypertension ([place]) 09/08/2016  . Type 2 diabetes mellitus  09/08/2016  . Gastroesophageal reflux disease without esophagitis 09/08/2016  . Osteopenia of multiple sites 09/08/2016  . History of anxiety 09/08/2016  . Benign paroxysmal positional vertigo 09/08/2016  . Smoker 09/08/2016  . Sleep apnea 09/08/2016   Past Medical History:  Diagnosis Date  . Acute cystitis    UTI  . Anxiety   . Arthritis   . Benign positional vertigo   . Dermatitis   . Diabetes mellitus without complication ([place])   . Family history of adverse reaction to anesthesia   . GERD (gastroesophageal reflux disease)   . Goiter, nontoxic, multinodular   . Gout   . H/O: hysterectomy   . History of tobacco use   . Hx laparoscopic cholecystectomy   . Hypercholesterolemia   . Hypertension   . Incomplete bladder emptying   . Insomnia   . Postmenopausal atrophic vaginitis   . RA (rheumatoid arthritis) ([place])   . Rectocele   . Renal calculus   . Sleep apnea   . Thyroid condition   . Type 2 diabetes mellitus ([place])   . Urolithiasis   . Vaginal enterocele     Family History  Problem Relation Age of Onset  . Diabetes Mother   . Heart disease Father 7  . Diabetes Son   . Crohn's disease Son     Past Surgical History:  Procedure Laterality Date  . CARPAL TUNNEL RELEASE  2018  . CATARACT EXTRACTION, BILATERAL    . CHOLECYSTECTOMY    . COLONOSCOPY     07/11/2009, 02/2015  . GALLBLADDER SURGERY    . KNEE ARTHROSCOPY Right 05/20/2020   Procedure: RIGHT KNEE ARTHROSCOPY AND DEBRIDEMENT;  Surgeon: [name], MD;  Location: [facility] [facility];  Service: Orthopedics;  Laterality: Right;  . PARTIAL HYSTERECTOMY     Overies left intact  . SQUAMOUS CELL CARCINOMA EXCISION  05/2018   Social History   Occupational History  .  Not on file  Tobacco Use  . Smoking status: Former Smoker    Packs/day: 0.20    Years: 10.00    Pack years: 2.00    Types: Cigarettes    Quit date: 07/05/1978    Years since quitting: 42.2  . Smokeless tobacco: Never Used  Vaping Use  . Vaping Use: Never used  Substance and Sexual Activity  . Alcohol use: No  . Drug use: No  . Sexual activity: Not Currently

## 2020-09-08 NOTE — Telephone Encounter (Signed)
Patient called advised she would like to get the injection in her right knee. The number to contact patient is [phone]

## 2020-09-09 NOTE — Telephone Encounter (Signed)
Appt sch for next Tuesday pt would like cortisone injection.

## 2020-09-10 ENCOUNTER — Other Ambulatory Visit: Payer: Self-pay

## 2020-09-10 ENCOUNTER — Ambulatory Visit: Payer: Medicare Other | Admitting: Physical Therapy

## 2020-09-10 ENCOUNTER — Encounter: Payer: Self-pay | Admitting: Physical Therapy

## 2020-09-10 DIAGNOSIS — R600 Localized edema: Secondary | ICD-10-CM

## 2020-09-10 DIAGNOSIS — R262 Difficulty in walking, not elsewhere classified: Secondary | ICD-10-CM

## 2020-09-10 DIAGNOSIS — M25561 Pain in right knee: Secondary | ICD-10-CM | POA: Diagnosis not present

## 2020-09-10 DIAGNOSIS — G8929 Other chronic pain: Secondary | ICD-10-CM | POA: Diagnosis not present

## 2020-09-10 DIAGNOSIS — M6281 Muscle weakness (generalized): Secondary | ICD-10-CM

## 2020-09-10 DIAGNOSIS — M25661 Stiffness of right knee, not elsewhere classified: Secondary | ICD-10-CM

## 2020-09-10 NOTE — Therapy (Signed)
[facility] [place], [place], [address] Phone: [phone]   Fax:  [phone]  Physical Therapy Treatment  Patient Details  Name: Annette Suarez MRN: [id number] Date of Birth: 11/06/1940 Referring Provider (PT): [name] MD   Encounter Date: 09/10/2020   PT End of Session - 09/10/20 1059    Visit Number 6    Number of Visits 12    PT Start Time 1497    PT Stop Time 1055    PT Time Calculation (min) 40 min    Activity Tolerance Patient tolerated treatment well;No increased pain;Patient limited by fatigue    Behavior During Therapy [facility] for tasks assessed/performed           Past Medical History:  Diagnosis Date  . Acute cystitis    UTI  . Anxiety   . Arthritis   . Benign positional vertigo   . Dermatitis   . Diabetes mellitus without complication ([place])   . Family history of adverse reaction to anesthesia   . GERD (gastroesophageal reflux disease)   . Goiter, nontoxic, multinodular   . Gout   . H/O: hysterectomy   . History of tobacco use   . Hx laparoscopic cholecystectomy   . Hypercholesterolemia   . Hypertension   . Incomplete bladder emptying   . Insomnia   . Postmenopausal atrophic vaginitis   . RA (rheumatoid arthritis) ([place])   . Rectocele   . Renal calculus   . Sleep apnea   . Thyroid condition   . Type 2 diabetes mellitus ([place])   . Urolithiasis   . Vaginal enterocele     Past Surgical History:  Procedure Laterality Date  . CARPAL TUNNEL RELEASE  2018  . CATARACT EXTRACTION, BILATERAL    . CHOLECYSTECTOMY    . COLONOSCOPY     07/11/2009, 02/2015  . GALLBLADDER SURGERY    . KNEE ARTHROSCOPY Right 05/20/2020   Procedure: RIGHT KNEE ARTHROSCOPY AND DEBRIDEMENT;  Surgeon: [name], MD;  Location: [place];  Service: Orthopedics;  Laterality: Right;  . PARTIAL HYSTERECTOMY     Overies left intact  . SQUAMOUS CELL CARCINOMA EXCISION  05/2018    There were no vitals filed for  this visit.   Subjective Assessment - 09/10/20 1035    Subjective Pt arriving today in 6/10 pain in R knee. Pt reporting after last visit she was sore the next day.    Limitations Standing;Walking    How long can you sit comfortably? NA.  Stiffness with prolonged sitting.    How long can you walk comfortably? 5-10 minutes    Patient Stated Goals Reduce pain, edema and get stronger.    Currently in Pain? Yes    Pain Score 6     Pain Location Knee    Pain Orientation Right    Pain Descriptors / Indicators Aching    Pain Type Chronic pain;Surgical pain    Pain Onset More than a month ago              [facility] PT Assessment - 09/10/20 0001      AROM   Right/Left Knee Right    Right Knee Extension 0    Right Knee Flexion 115      Strength   Right Knee Extension 4-/5   limited by pain                        [facility] Adult PT Treatment/Exercise -  09/10/20 0001      Exercises   Exercises Knee/Hip      Knee/Hip Exercises: Stretches   Gastroc Stretch 3 reps;20 seconds      Knee/Hip Exercises: Aerobic   Recumbent Bike L3 x 6 minutes seat at 4      Knee/Hip Exercises: Machines for Strengthening   Cybex Leg Press 2 sets of 10 @ 87# double leg and 2 sets of 10 @ 50# single leg      Knee/Hip Exercises: Standing   Other Standing Knee Exercises TRX x10 with chair behind pt      Knee/Hip Exercises: Seated   Long Arc Quad Strengthening;Right;15 reps    Long Arc Quad Weight 3 lbs.    Long Arc Quad Limitations Seated straight leg raises 3x10    Hamstring Curl Right;Strengthening;2 sets;10 reps;Other (comment)    Hamstring Limitations red theraband    Sit to Sand without UE support;15 reps      Manual Therapy   Manual therapy comments PROM knee flexion and extension, STM around R knee                    PT Short Term Goals - 09/01/20 1036      PT SHORT TERM GOAL #1   Title Shanetha will be independent with her starter long-term HEP.    Status Achieved              PT Long Term Goals - 09/10/20 1212      PT LONG TERM GOAL #1   Title Brandee will improve her self-reported FOTO score to 68.    Status On-going      PT LONG TERM GOAL #2   Title Improve B quadriceps strength to at least 4/5 MMT.    Status On-going      PT LONG TERM GOAL #3   Title Luverna will report R knee pain consistently 0-3/10 on the Numeric Pain Rating Scale.    Status On-going      PT LONG TERM GOAL #4   Title Akari will report improved endurance with WB function (standing and walking) as assessed by FOTO and subjective self-report.    Status On-going      PT LONG TERM GOAL #5   Title Deisy will be independent with her long-term HEP at DC.    Status On-going                 Plan - 09/10/20 1207    Clinical Impression Statement Pt arriving today reporting 6/10 pain in her R knee. Pt reporting soreness following her last PT visit. Pt still complaining of swelling around her medial right knee. Pt was advised to take anti-inflammatory over the counter pain medication if there are no contraindications. Pt stating Dr. Sharol Given recommended R total knee replacement. Pt stated that was not an option for her. Pt presenting with AROM WFL, still progressing toward decreasing pain and overall strength and balance. Continue skilled PT.    Personal Factors and Comorbidities Comorbidity 1    Comorbidities Previously mentioned arthroscopy s/p November 2021. , BPPV, Type 2 DM, B foot OA    Examination-Activity Limitations Lift;Squat;Locomotion Level;Stairs;Stand    Examination-Participation Restrictions Community Activity;Cleaning    Stability/Clinical Decision Making Stable/Uncomplicated    Rehab Potential Good    PT Frequency 2x / week    PT Duration 6 weeks    PT Treatment/Interventions ADLs/Self Care Home Management;Moist Heat;Cryotherapy;Occupational psychologist;Therapeutic activities;Neuromuscular re-education;Therapeutic exercise;Balance  training;Patient/family education;Vasopneumatic Device  PT Next Visit Plan Quadriceps and general LE strength and conditioning work    PT Home Exercise Plan Access Code: [email or web address] (added: sit to stand, hip abd, hamstring curls, heel raises, step ups)    Consulted and Agree with Plan of Care Patient           Patient will benefit from skilled therapeutic intervention in order to improve the following deficits and impairments:  Abnormal gait,Cardiopulmonary status limiting activity,Decreased activity tolerance,Decreased endurance,Decreased range of motion,Decreased mobility,Decreased strength,Difficulty walking,Hypomobility,Increased edema,Impaired flexibility,Pain  Visit Diagnosis: Difficulty walking  Muscle weakness (generalized)  Chronic pain of right knee  Stiffness of right knee, not elsewhere classified  Localized edema     Problem List Patient Active Problem List   Diagnosis Date Noted  . Alteration in blood pressure 06/11/2020  . Old complex tear of medial meniscus of right knee   . Old complex tear of lateral meniscus of right knee   . Primary osteoarthritis of both feet 12/13/2017  . Dehydration 09/09/2017  . Acute lower UTI 09/09/2017  . Nausea & vomiting 09/08/2017  . Primary osteoarthritis of both hands 04/05/2017  . Left carpal tunnel syndrome 11/15/2016  . Rheumatoid arthritis of multiple sites with negative rheumatoid factor ([place]) 10/06/2016  . High risk medication use 10/06/2016  . Dyslipidemia 10/06/2016  . Pain in both hands 09/08/2016  . Diabetes mellitus with coincident hypertension ([place]) 09/08/2016  . Type 2 diabetes mellitus  09/08/2016  . Gastroesophageal reflux disease without esophagitis 09/08/2016  . Osteopenia of multiple sites 09/08/2016  . History of anxiety 09/08/2016  . Benign paroxysmal positional vertigo 09/08/2016  . Smoker 09/08/2016  . Sleep apnea 09/08/2016    Oretha Caprice, PT, MPT 09/10/2020, 12:13 PM  [facility] [address] [place], [place], [address] Phone: [phone]   Fax:  [phone]  Name: BLONDIE RIGGSBEE MRN: [id number] Date of Birth: 06-Sep-1940

## 2020-09-11 ENCOUNTER — Ambulatory Visit: Payer: Medicare Other | Admitting: Internal Medicine

## 2020-09-11 ENCOUNTER — Encounter: Payer: Self-pay | Admitting: Internal Medicine

## 2020-09-11 VITALS — BP 192/88 | HR 90 | Ht 62.0 in | Wt 163.0 lb

## 2020-09-11 DIAGNOSIS — I771 Stricture of artery: Secondary | ICD-10-CM | POA: Diagnosis not present

## 2020-09-11 DIAGNOSIS — E1159 Type 2 diabetes mellitus with other circulatory complications: Secondary | ICD-10-CM | POA: Diagnosis not present

## 2020-09-11 DIAGNOSIS — I700 Atherosclerosis of aorta: Secondary | ICD-10-CM

## 2020-09-11 DIAGNOSIS — I152 Hypertension secondary to endocrine disorders: Secondary | ICD-10-CM

## 2020-09-11 MED ORDER — AMLODIPINE BESYLATE 5 MG PO TABS: 5.0000 mg | ORAL_TABLET | Freq: Every day | ORAL | 3 refills | Status: DC

## 2020-09-11 NOTE — Progress Notes (Signed)
Cardiology Office Note:    Date:  09/11/2020   ID:  Annette Suarez, DOB [birth year], MRN [id number]  PCP:  [name], NP  [facility] Cardiologist:  Rudean Haskell MD [place] Electrophysiologist:  None   CC: Follow up Subclavian Stenosis  History of Present Illness:    Annette Suarez is a 80 y.o. female with a hx of Diabetes and HTN, HLD, OSA not on CPAP who presents for evaluation 06/11/20.  In interim, had CT with new diagnosis of subclavian stenosis and aortic atherosclerosis.  Patient notes that she is doing not too good.  Since last visit notes still having knee pain after .  Relevant interval testing or therapy include Carotid testing, echo, CT.  There are no interval hospital/ED visit.    No chest pain or pressure.  No SOB/DOE.Marland Kitchen No palpitations or syncope.  No further dizziness.  Ambulatory blood pressure not done.  Past Medical History:  Diagnosis Date  . Acute cystitis    UTI  . Anxiety   . Arthritis   . Benign positional vertigo   . Dermatitis   . Diabetes mellitus without complication ([place])   . Family history of adverse reaction to anesthesia   . GERD (gastroesophageal reflux disease)   . Goiter, nontoxic, multinodular   . Gout   . H/O: hysterectomy   . History of tobacco use   . Hx laparoscopic cholecystectomy   . Hypercholesterolemia   . Hypertension   . Incomplete bladder emptying   . Insomnia   . Postmenopausal atrophic vaginitis   . RA (rheumatoid arthritis) ([place])   . Rectocele   . Renal calculus   . Sleep apnea   . Thyroid condition   . Type 2 diabetes mellitus ([place])   . Urolithiasis   . Vaginal enterocele     Past Surgical History:  Procedure Laterality Date  . CARPAL TUNNEL RELEASE  2018  . CATARACT EXTRACTION, BILATERAL    . CHOLECYSTECTOMY    . COLONOSCOPY     07/11/2009, 02/2015  . GALLBLADDER SURGERY    . KNEE ARTHROSCOPY Right 05/20/2020   Procedure: RIGHT KNEE ARTHROSCOPY AND DEBRIDEMENT;  Surgeon: [name], MD;   Location: [place];  Service: Orthopedics;  Laterality: Right;  . PARTIAL HYSTERECTOMY     Overies left intact  . SQUAMOUS CELL CARCINOMA EXCISION  05/2018    Current Medications: Current Meds  Medication Sig  . Accu-Chek Softclix Lancets lancets Use to test blood sugar three times daily. Dx: E11.8  . aspirin EC 81 MG tablet Take 81 mg by mouth daily.  . Diclofenac Sodium (VOLTAREN EX) Apply 1 application topically 3 (three) times daily as needed.  Marland Kitchen estradiol (ESTRACE) 0.1 MG/GM vaginal cream Place 1 Applicatorful vaginally 3 (three) times a week.  . etanercept (ENBREL SURECLICK) 50 MG/ML injection Inject 50 mg into the skin once a week.  . fluocinonide (LIDEX) 0.05 % external solution Apply 1 application topically daily as needed.  Marland Kitchen glucose blood (ACCU-CHEK GUIDE) test strip Use to check blood sugar three times daily Dx: E11.8  . ibuprofen (ADVIL) 200 MG tablet Take 200 mg by mouth as needed. For pain  . insulin aspart protamine- aspart (NOVOLOG MIX 70/30) (70-30) 100 UNIT/ML injection Inject 0.36 mLs (36 Units total) into the skin 2 (two) times daily with a meal.  . irbesartan (AVAPRO) 300 MG tablet Take 1 tablet (300 mg total) by mouth daily.  Marland Kitchen loperamide (IMODIUM A-D) 2 MG tablet Take 2  mg by mouth as needed for diarrhea or loose stools.  Marland Kitchen LORazepam (ATIVAN) 0.5 MG tablet Take 1 tablet (0.5 mg total) by mouth at bedtime as needed for anxiety.  Marland Kitchen MELATONIN PO Take 10 mg by mouth at bedtime as needed.  . Omeprazole 20 MG TBDD Take 20 mg by mouth daily.  . ondansetron (ZOFRAN) 4 MG tablet Take 4 mg by mouth 3 (three) times daily as needed for nausea or vomiting.  . Probiotic Product (PROBIOTIC PO) Take by mouth daily.  . simvastatin (ZOCOR) 20 MG tablet Take 1 tablet (20 mg total) by mouth every evening.  . [DISCONTINUED] amLODipine (NORVASC) 5 MG tablet Take 1 tablet (5 mg total) by mouth daily.  . [DISCONTINUED] ciprofloxacin (CIPRO) 500 MG tablet Take 1 tablet (500  mg total) by mouth 2 (two) times daily.     Allergies:   Amaryl [glimepiride], Codeine, Jardiance [empagliflozin], Pneumovax 23 [pneumococcal vac polyvalent], Wellbutrin [bupropion], and Latex   Social History   Socioeconomic History  . Marital status: Married    Spouse name: Not on file  . Number of children: Not on file  . Years of education: Not on file  . Highest education level: Not on file  Occupational History  . Not on file  Tobacco Use  . Smoking status: Former Smoker    Packs/day: 0.20    Years: 10.00    Pack years: 2.00    Types: Cigarettes    Quit date: 07/05/1978    Years since quitting: 42.2  . Smokeless tobacco: Never Used  Vaping Use  . Vaping Use: Never used  Substance and Sexual Activity  . Alcohol use: No  . Drug use: No  . Sexual activity: Not Currently  Other Topics Concern  . Not on file  Social History Narrative   Diet: Blank      Do you drink/ eat things with caffeine? Yes      Marital status:  Married                             What year were you married ? 196      Do you live in a house, apartment,assistred living, condo, trailer, etc.)? House      Is it one or more stories? 1      How many persons live in your home ? 2      Do you have any pets in your home ?(please list)  1 Sam-Dog      Highest Level of education completed: 12      Current or past profession: Office      Do you exercise?   Some                           Type & how often  Walk Daily      ADVANCED DIRECTIVES (Please bring copies)      Do you have a living will? Blank      Do you have a DNR form?  Blank                     If not, do you want to discuss one?       Do you have signed POA?HPOA forms?  Yes               If so, please bring to your appointment      FUNCTIONAL  STATUS- To be completed by Spouse / child / Staff       Do you have difficulty bathing or dressing yourself ?  No      Do you have difficulty preparing food or eating ?  No      Do you have  difficulty managing your mediation ?  No      Do you have difficulty managing your finances ?  No      Do you have difficulty affording your medication ?  Yes (Some)      Social Determinants of Health   Financial Resource Strain: Not on file  Food Insecurity: Not on file  Transportation Needs: Not on file  Physical Activity: Not on file  Stress: Not on file  Social Connections: Not on file    Family History: The patient's family history includes Crohn's disease in her son; Diabetes in her mother and son; Heart disease (age of onset: 43) in her father. Denies family history of sudden cardiac death including drowning, car accidents, or unexplained deaths in the family. No history of aortic dissection, subclavian stenosis, or coarctation of the aorta in the family  ROS:   Please see the history of present illness.    Right knee pain and recovering from knee surgery All other systems reviewed and are negative.  EKGs/Labs/Other Studies Reviewed:    The following studies were reviewed today:  EKG:   05/16/20 SR rate 84 with non-specific T wave flattening  Transthoracic Echocardiogram Date: 07/08/20 Results 1. Left ventricular ejection fraction, by estimation, is >75%. The left  ventricle has hyperdynamic function. The left ventricle has no regional  wall motion abnormalities. Left ventricular diastolic parameters are  consistent with Grade I diastolic  dysfunction (impaired relaxation). Elevated left atrial pressure.  2. Right ventricular systolic function is normal. The right ventricular  size is normal. There is mildly elevated pulmonary artery systolic  pressure.  3. The mitral valve is normal in structure. Mild mitral valve  regurgitation. No evidence of mitral stenosis.  4. The aortic valve is tricuspid. Aortic valve regurgitation is trivial.  Mild aortic valve sclerosis is present, with no evidence of aortic valve  stenosis.  5. The inferior vena cava is normal in  size with greater than 50%  respiratory variability, suggesting right atrial pressure of 3 mmHg.  Carotid Artery Duplex: Date: 07/16/20 Results: Summary:  Right Carotid: Velocities in the right ICA are consistent with a 1-39%  stenosis.   Left Carotid: Velocities in the left ICA are consistent with a 40-59%  stenosis.   Vertebrals: Right vertebral artery demonstrates antegrade flow. Left  vertebral        artery demonstrates retrograde flow, consistent with  subclavian        steal syndrome.  Subclavians: Left subclavian artery flow was disturbed, with dampened  monophasic        flow consistent with a more proximal stenosis or occlusion.  Normal        flow hemodynamics were seen in the right subclavian artery.        There is a 20 mmHg difference brachial pressure gradient,  with the        left arm being the lowest. The right brachial artery is  patent with        triphasic flow, the left brachial artery is patent with        bi/monophasic flow.   NonCardiac CT: Date: 07/08/20 Results: IMPRESSION: Vascular: 1. Severe stenosis of the proximal left  subclavian artery secondary to calcified atherosclerotic plaque. 2. Widely patent and normal appearing right brachiocephalic and right subclavian arteries. No evidence of aortic coarctation, as queried. 3.  Aortic Atherosclerosis   Recent Labs: 06/03/2020: ALT 10; Hemoglobin 13.7; Platelets 386; TSH 1.67 06/11/2020: BUN 16; Creatinine, Ser 0.97; Potassium 4.6; Sodium 141  Recent Lipid Panel    Component Value Date/Time   CHOL 126 01/22/2020 0000   TRIG 164 (A) 01/22/2020 0000   HDL 44 01/22/2020 0000   LDLCALC 55 01/22/2020 0000     Physical Exam:    VS:  BP (!) 192/88 Comment: right arm electronic  Pulse 90   Ht 5\' 2"  (1.575 m)   Wt 163 lb (73.9 kg)   SpO2 98%   BMI 29.81 kg/m     Blood Pressure  Left Arm 136/80 Right Arm  170/84 Manual My Repeat R  arm 180/80 Manual Her R wrist cuff:  181/87  Wt Readings from Last 3 Encounters:  09/11/20 163 lb (73.9 kg)  08/26/20 163 lb (73.9 kg)  07/14/20 152 lb 3.2 oz (69 kg)     GEN:  Well nourished, well developed in no acute distress HEENT: Normal NECK: No JVD; No carotid bruits LYMPHATICS: No lymphadenopathy CARDIAC: RRR, no murmurs, rubs, gallops; +2 radial pulses bilaterally without subclavian bruit bilaterally RESPIRATORY:  Clear to auscultation without rales, wheezing or rhonchi  ABDOMEN: Soft, non-tender, non-distended MUSCULOSKELETAL:  No edema; No deformity  SKIN: Warm and dry NEUROLOGIC:  Alert and oriented x 3 PSYCHIATRIC:  Normal affect   ASSESSMENT:    1. Subclavian arterial stenosis (HCC)   2. Hypertension associated with diabetes ([place])   3. Aortic atherosclerosis (HCC)    PLAN:    In order of problems listed above:  Subclavian Stenosis - Treatment is indicated for symptomatic patients with arm claudication, cerebral hypoperfusion, and subclavian steal syndrome. - Left subclavian stenosis, blood flow of the branchial artery is supplied from the contralateral vertebral artery to the ipsilateral artery, retrograde, leading to ipsilateral blood pressure is artificially lower in that arm; Will use right arm blood pressures for goal - will send to one of my vascular partners for evaluation of stenting and appropriate monitoring - continue ASA 81 mg Po daily  Hypertension with Diabetes - ambulatory blood pressure not done, will restartambulatory BP monitoring; gave education on how to perform ambulatory blood pressure monitoring including the frequency and technique; goal ambulatory blood pressure < 135/85 on average - worked with patient's cuff - continue home medications with the exception of adding amlodipine 5 mg PO Daily - discussed diet (DASH/low sodium), and exercise/weight loss interventions  - will send to [facility] for evaluation and assistance  Aortic  Atherosclerosis Hyperlipidemia -LDL goal less than 70 -continue current statin - gave education on dietary changes  Fall follow up unless new symptoms or abnormal test results warranting change in plan  Would be reasonable for  APP Follow up   Medication Adjustments/Labs and Tests Ordered: Current medicines are reviewed at length with the patient today.  Concerns regarding medicines are outlined above.  Orders Placed This Encounter  Procedures  . Ambulatory Referral for Peripheral Vascular Consult  . AMB Referral to [facility] Pharm-D   Meds ordered this encounter  Medications  . DISCONTD: amLODipine (NORVASC) 5 MG tablet    Sig: Take 1 tablet (5 mg total) by mouth daily.    Dispense:  180 tablet    Refill:  3  . amLODipine (NORVASC) 5 MG tablet  Sig: Take 1 tablet (5 mg total) by mouth daily.    Dispense:  90 tablet    Refill:  3    Patient Instructions  Medication Instructions:  Your physician has recommended you make the following change in your medication:  START: amlodipine (Norvasc) 5 mg by mouth daily  *If you need a refill on your cardiac medications before your next appointment, please call your pharmacy*   Lab Work: NONE If you have labs (blood work) drawn today and your tests are completely normal, you will receive your results only by: Marland Kitchen MyChart Message (if you have MyChart) OR . A paper copy in the mail If you have any lab test that is abnormal or we need to change your treatment, we will call you to review the results.   Testing/Procedures: Your physician has referred you to our Pharmacist to assist with treating hypertension.   Your physician has referred you to a vascular specialist.   Follow-Up: At [facility], you and your health needs are our priority.  As part of our continuing mission to provide you with exceptional heart care, we have created designated Provider Care Teams.  These Care Teams include your primary Cardiologist (physician) and  Advanced Practice Providers (APPs -  Physician Assistants and Nurse Practitioners) who all work together to provide you with the care you need, when you need it.  We recommend signing up for the patient portal called "MyChart".  Sign up information is provided on this After Visit Summary.  MyChart is used to connect with patients for Virtual Visits (Telemedicine).  Patients are able to view lab/test results, encounter notes, upcoming appointments, etc.  Non-urgent messages can be sent to your provider as well.   To learn more about what you can do with MyChart, go to [email or web address].    Your next appointment:   5-7 month(s)  The format for your next appointment:   In Person  Provider:   You may see Gasper Sells, MD or one of the following Advanced Practice Providers on your designated Care Team:    Melina Copa, PA-C  Ermalinda Barrios, PA-C         Signed, Werner Lean, MD  09/11/2020 11:06 AM    [place]

## 2020-09-11 NOTE — Patient Instructions (Signed)
Medication Instructions:  Your physician has recommended you make the following change in your medication:  START: amlodipine (Norvasc) 5 mg by mouth daily  *If you need a refill on your cardiac medications before your next appointment, please call your pharmacy*   Lab Work: NONE If you have labs (blood work) drawn today and your tests are completely normal, you will receive your results only by: Marland Kitchen MyChart Message (if you have MyChart) OR . A paper copy in the mail If you have any lab test that is abnormal or we need to change your treatment, we will call you to review the results.   Testing/Procedures: Your physician has referred you to our Pharmacist to assist with treating hypertension.   Your physician has referred you to a vascular specialist.   Follow-Up: At [facility], you and your health needs are our priority.  As part of our continuing mission to provide you with exceptional heart care, we have created designated Provider Care Teams.  These Care Teams include your primary Cardiologist (physician) and Advanced Practice Providers (APPs -  Physician Assistants and Nurse Practitioners) who all work together to provide you with the care you need, when you need it.  We recommend signing up for the patient portal called "MyChart".  Sign up information is provided on this After Visit Summary.  MyChart is used to connect with patients for Virtual Visits (Telemedicine).  Patients are able to view lab/test results, encounter notes, upcoming appointments, etc.  Non-urgent messages can be sent to your provider as well.   To learn more about what you can do with MyChart, go to [email or web address].    Your next appointment:   5-7 month(s)  The format for your next appointment:   In Person  Provider:   You may see Gasper Sells, MD or one of the following Advanced Practice Providers on your designated Care Team:    Melina Copa, PA-C  Ermalinda Barrios, PA-C

## 2020-09-12 ENCOUNTER — Ambulatory Visit: Payer: Medicare Other | Admitting: Rehabilitative and Restorative Service Providers"

## 2020-09-12 ENCOUNTER — Other Ambulatory Visit: Payer: Self-pay

## 2020-09-12 ENCOUNTER — Encounter: Payer: Self-pay | Admitting: Rehabilitative and Restorative Service Providers"

## 2020-09-12 DIAGNOSIS — M25561 Pain in right knee: Secondary | ICD-10-CM | POA: Diagnosis not present

## 2020-09-12 DIAGNOSIS — M6281 Muscle weakness (generalized): Secondary | ICD-10-CM | POA: Diagnosis not present

## 2020-09-12 DIAGNOSIS — R600 Localized edema: Secondary | ICD-10-CM

## 2020-09-12 DIAGNOSIS — R262 Difficulty in walking, not elsewhere classified: Secondary | ICD-10-CM

## 2020-09-12 DIAGNOSIS — G8929 Other chronic pain: Secondary | ICD-10-CM | POA: Diagnosis not present

## 2020-09-12 DIAGNOSIS — M25661 Stiffness of right knee, not elsewhere classified: Secondary | ICD-10-CM

## 2020-09-12 NOTE — Therapy (Signed)
[facility] [address] [place], [place], [address] Phone: [phone]   Fax:  [phone]  Physical Therapy Treatment  Patient Details  Name: Annette Suarez MRN: [id number] Date of Birth: 1940/09/10 Referring Provider (PT): [name] MD  PHYSICAL THERAPY DISCHARGE SUMMARY  Visits from Start of Care: 7  Current functional level related to goals / functional outcomes: See note   Remaining deficits: See note   Education / Equipment: HEP  Plan: Patient agrees to discharge.  Patient goals were partially met. Patient is being discharged due to the patient's request.  ?????      Encounter Date: 09/12/2020   PT End of Session - 09/12/20 1343    Visit Number 7    Number of Visits 12    PT Start Time 1019    PT Stop Time 1100    PT Time Calculation (min) 41 min    Activity Tolerance Patient tolerated treatment well;No increased pain;Patient limited by fatigue    Behavior During Therapy [facility] for tasks assessed/performed           Past Medical History:  Diagnosis Date  . Acute cystitis    UTI  . Anxiety   . Arthritis   . Benign positional vertigo   . Dermatitis   . Diabetes mellitus without complication ([place])   . Family history of adverse reaction to anesthesia   . GERD (gastroesophageal reflux disease)   . Goiter, nontoxic, multinodular   . Gout   . H/O: hysterectomy   . History of tobacco use   . Hx laparoscopic cholecystectomy   . Hypercholesterolemia   . Hypertension   . Incomplete bladder emptying   . Insomnia   . Postmenopausal atrophic vaginitis   . RA (rheumatoid arthritis) ([place])   . Rectocele   . Renal calculus   . Sleep apnea   . Thyroid condition   . Type 2 diabetes mellitus ([place])   . Urolithiasis   . Vaginal enterocele     Past Surgical History:  Procedure Laterality Date  . CARPAL TUNNEL RELEASE  2018  . CATARACT EXTRACTION, BILATERAL    . CHOLECYSTECTOMY    . COLONOSCOPY     07/11/2009, 02/2015  .  GALLBLADDER SURGERY    . KNEE ARTHROSCOPY Right 05/20/2020   Procedure: RIGHT KNEE ARTHROSCOPY AND DEBRIDEMENT;  Surgeon: [name], MD;  Location: [facility] [facility];  Service: Orthopedics;  Laterality: Right;  . PARTIAL HYSTERECTOMY     Overies left intact  . SQUAMOUS CELL CARCINOMA EXCISION  05/2018    There were no vitals filed for this visit.   Subjective Assessment - 09/12/20 1025    Subjective Annette Suarez reports she would like to avoid a knee replacement if possible.  She is getting a cortisone shot on Tuesday March 15.    Pertinent History Previous R knee scope in 11 of 2021.    Limitations Standing;Walking    How long can you sit comfortably? NA.  Stiffness with prolonged sitting.    How long can you stand comfortably? Several hours    How long can you walk comfortably? 5-10 minutes    Patient Stated Goals Reduce pain, edema and get stronger.    Currently in Pain? Yes    Pain Score 5     Pain Location Knee    Pain Orientation Right    Pain Descriptors / Indicators Tightness;Discomfort    Pain Type Chronic pain;Surgical pain    Pain Onset More than a month  ago    Pain Frequency Intermittent    Aggravating Factors  WB, prolonged postures, stiffness after prolonged rest    Pain Relieving Factors Ice and rest, pain rub, ibuprofen    Effect of Pain on Daily Activities Needs ibuprofen to sleep, limited walking endurance due to R knee pain, start-up stiffness after sleeping or prolonged sitting    Multiple Pain Sites No              [facility] PT Assessment - 09/12/20 0001      Observation/Other Assessments   Focus on Therapeutic Outcomes (FOTO)  53 (Goal 68, 50 at evaluation)      ROM / Strength   AROM / PROM / Strength AROM;Strength      AROM   Overall AROM  Deficits    AROM Assessment Site Knee    Right/Left Knee Right    Right Knee Extension 0    Right Knee Flexion 120      Strength   Overall Strength Deficits    Strength Assessment Site Knee     Right/Left Knee Right    Right Knee Extension 4-/5                         [facility] Adult PT Treatment/Exercise - 09/12/20 0001      Exercises   Exercises Knee/Hip      Knee/Hip Exercises: Aerobic   Recumbent Bike Seat 3 Level 3 for 8 minutes      Knee/Hip Exercises: Machines for Strengthening   Cybex Leg Press 2 sets of 10 1 at 87# and 1 at 100# double leg and 2 sets of 10 @ 50# single leg R only      Knee/Hip Exercises: Seated   Long Arc Quad Strengthening;Both;3 sets;5 reps;Other (comment)    Long Arc Quad Limitations Seated straight leg raises    Sit to [employer] 5 reps;with UE support;Other (comment)   slow eccentrics                 PT Education - 09/12/20 1341    Education Details Reviewed Shalee's HEP, reviewed RA findings and recommended she discuss TKA with Dr. Sharol Given (he has already recommended).    Person(s) Educated Patient    Methods Explanation;Demonstration;Verbal cues    Comprehension Verbal cues required;Returned demonstration;Verbalized understanding            PT Short Term Goals - 09/12/20 1342      PT SHORT TERM GOAL #1   Title Annette Suarez will be independent with her starter long-term HEP.    Status Achieved             PT Long Term Goals - 09/12/20 1342      PT LONG TERM GOAL #1   Title Annette Suarez will improve her self-reported FOTO score to 68.    Baseline 53, was 50 at evaluation    Status On-going      PT LONG TERM GOAL #2   Title Improve B quadriceps strength to at least 4/5 MMT.    Status On-going      PT LONG TERM GOAL #3   Title Annette Suarez will report R knee pain consistently 0-3/10 on the Numeric Pain Rating Scale.    Status On-going      PT LONG TERM GOAL #4   Title Annette Suarez will report improved endurance with WB function (standing and walking) as assessed by FOTO and subjective self-report.    Status Achieved  PT LONG TERM GOAL #5   Title Annette Suarez will be independent with her long-term HEP at DC.    Status Achieved                  Plan - 09/12/20 1344    Clinical Impression Statement Annette Suarez has improved her R knee AROM, strength and self-reported function with 7 PT visits.  She will benefit from continued quadriceps strength work long-term to avoid or prepare for TKA.  Annette Suarez chooses to continue her exercise independently and has an open invitation to return if requested.    Personal Factors and Comorbidities Comorbidity 1    Comorbidities Previously mentioned arthroscopy s/p November 2021. , BPPV, Type 2 DM, B foot OA    Examination-Activity Limitations Lift;Squat;Locomotion Level;Stairs;Stand    Examination-Participation Restrictions Community Activity;Cleaning    Stability/Clinical Decision Making Stable/Uncomplicated    Rehab Potential Good    PT Frequency 2x / week    PT Duration 6 weeks    PT Treatment/Interventions ADLs/Self Care Home Management;Moist Heat;Cryotherapy;Occupational psychologist;Therapeutic activities;Neuromuscular re-education;Therapeutic exercise;Balance training;Patient/family education;Vasopneumatic Device    PT Next Visit Plan Quadriceps and general LE strength and conditioning work    PT Home Exercise Plan Access Code: [email or web address] (added: sit to stand, hip abd, hamstring curls, heel raises, step ups)    Consulted and Agree with Plan of Care Patient           Patient will benefit from skilled therapeutic intervention in order to improve the following deficits and impairments:  Abnormal gait,Cardiopulmonary status limiting activity,Decreased activity tolerance,Decreased endurance,Decreased range of motion,Decreased mobility,Decreased strength,Difficulty walking,Hypomobility,Increased edema,Impaired flexibility,Pain  Visit Diagnosis: Difficulty walking  Localized edema  Muscle weakness (generalized)  Chronic pain of right knee  Stiffness of right knee, not elsewhere classified     Problem List Patient Active Problem List   Diagnosis Date Noted   . Subclavian arterial stenosis ([place]) 09/11/2020  . Aortic atherosclerosis ([place]) 09/11/2020  . Alteration in blood pressure 06/11/2020  . Old complex tear of medial meniscus of right knee   . Old complex tear of lateral meniscus of right knee   . Primary osteoarthritis of both feet 12/13/2017  . Dehydration 09/09/2017  . Acute lower UTI 09/09/2017  . Nausea & vomiting 09/08/2017  . Primary osteoarthritis of both hands 04/05/2017  . Left carpal tunnel syndrome 11/15/2016  . Rheumatoid arthritis of multiple sites with negative rheumatoid factor ([place]) 10/06/2016  . High risk medication use 10/06/2016  . Dyslipidemia 10/06/2016  . Pain in both hands 09/08/2016  . Hypertension associated with diabetes ([place]) 09/08/2016  . Type 2 diabetes mellitus  09/08/2016  . Gastroesophageal reflux disease without esophagitis 09/08/2016  . Osteopenia of multiple sites 09/08/2016  . History of anxiety 09/08/2016  . Benign paroxysmal positional vertigo 09/08/2016  . Sleep apnea 09/08/2016    Farley Ly PT, MPT 09/12/2020, 1:47 PM  [facility] [address] [place], [place], [address] Phone: [phone]   Fax:  [phone]  Name: DREAMA KUNA MRN: [id number] Date of Birth: 27-Aug-1940

## 2020-09-12 NOTE — Patient Instructions (Signed)
Continue seated straight leg raises 30-50/Day, stationary bike and sit to stand with HEP.

## 2020-09-16 ENCOUNTER — Other Ambulatory Visit: Payer: Self-pay

## 2020-09-16 ENCOUNTER — Ambulatory Visit: Payer: Medicare Other | Admitting: Physician Assistant

## 2020-09-16 ENCOUNTER — Encounter: Payer: Self-pay | Admitting: Orthopedic Surgery

## 2020-09-16 DIAGNOSIS — M25561 Pain in right knee: Secondary | ICD-10-CM | POA: Diagnosis not present

## 2020-09-16 MED ORDER — LIDOCAINE HCL 1 % IJ SOLN
5.0000 mL | INTRAMUSCULAR | Status: AC | PRN
  Administered 2020-09-16: 5 mL

## 2020-09-16 MED ORDER — METHYLPREDNISOLONE ACETATE 40 MG/ML IJ SUSP
40.0000 mg | INTRAMUSCULAR | Status: AC | PRN
  Administered 2020-09-16: 40 mg via INTRA_ARTICULAR

## 2020-09-16 NOTE — Progress Notes (Signed)
Office Visit Note   Patient: Annette Suarez           Date of Birth: May 15, 1941           MRN: [id number] Visit Date: 09/16/2020              Requested by: [name], NP [phone] [address] [place],   [address] PCP: [name], NP  Chief Complaint  Patient presents with  . Right Knee - Pain      HPI: Patient presents today in follow-up for her right knee pain.  She is status post arthroscopy and is finished a course of physical therapy.  She is wondering if she can try 1 more cortisone injection and also inquiring about gel injections.  She is trying to avoid a knee replacement if possible  Assessment & Plan: Visit Diagnoses: No diagnosis found.  Plan: Patient was injected today.  Discussed with her that I am not sure gel injections would be beneficial however if she does get some relief from the steroid injection we could go forward and authorizing them if the relief was not long lived.  Follow-up for reevaluation in 3 weeks  Follow-Up Instructions: No follow-ups on file.   Ortho Exam  Patient is alert, oriented, no adenopathy, well-dressed, normal affect, normal respiratory effort. Right knee well-healed surgical portals.  No effusion no cellulitis.  Tender palpation of the medial lateral joint line and over the patellofemoral joint.  Imaging: No results found. No images are attached to the encounter.  Labs: Lab Results  Component Value Date   HGBA1C 8.0 (H) 06/03/2020   HGBA1C 7.5 01/22/2020   HGBA1C 8.1 11/26/2019   ESRSEDRATE 32 (H) 09/08/2016   LABURIC 5.7 09/12/2017     Lab Results  Component Value Date   ALBUMIN 4.6 07/25/2019   ALBUMIN 3.2 (L) 09/09/2017   ALBUMIN 3.9 09/08/2017   LABURIC 5.7 09/12/2017    Lab Results  Component Value Date   MG 1.2 (L) 09/09/2017   No results found for: VD25OH  No results found for: PREALBUMIN CBC EXTENDED Latest Ref Rng & Units 06/03/2020 03/14/2020 12/31/2019  WBC 3.8 - 10.8 Thousand/uL 13.1(H) 13.1(H)  9.0  RBC 3.80 - 5.10 Million/uL 4.31 4.33 4.12  HGB 11.7 - 15.5 g/dL 13.7 13.5 12.9  HCT 35.0 - 45.0 % 39.6 40.1 37.7  PLT 140 - 400 Thousand/uL 386 357 292  NEUTROABS 1,500 - 7,800 cells/uL 9,406(H) 9,367(H) 5,652  LYMPHSABS 850 - 3,900 cells/uL 2,856 2,764 2,376     There is no height or weight on file to calculate BMI.  Orders:  No orders of the defined types were placed in this encounter.  No orders of the defined types were placed in this encounter.    Procedures: Large Joint Inj: R knee on 09/16/2020 2:50 PM Indications: pain and diagnostic evaluation Details: 22 G 1.5 in needle  Arthrogram: No  Medications: 40 mg methylPREDNISolone acetate 40 MG/ML; 5 mL lidocaine 1 % Outcome: tolerated well, no immediate complications Procedure, treatment alternatives, risks and benefits explained, specific risks discussed. Consent was given by the patient.      Clinical Data: No additional findings.  ROS:  All other systems negative, except as noted in the HPI. Review of Systems  Objective: Vital Signs: There were no vitals taken for this visit.  Specialty Comments:  No specialty comments available.  PMFS History: Patient Active Problem List   Diagnosis Date Noted  . Subclavian arterial stenosis ([place]) 09/11/2020  .  Aortic atherosclerosis ([place]) 09/11/2020  . Alteration in blood pressure 06/11/2020  . Old complex tear of medial meniscus of right knee   . Old complex tear of lateral meniscus of right knee   . Primary osteoarthritis of both feet 12/13/2017  . Dehydration 09/09/2017  . Acute lower UTI 09/09/2017  . Nausea & vomiting 09/08/2017  . Primary osteoarthritis of both hands 04/05/2017  . Left carpal tunnel syndrome 11/15/2016  . Rheumatoid arthritis of multiple sites with negative rheumatoid factor ([place]) 10/06/2016  . High risk medication use 10/06/2016  . Dyslipidemia 10/06/2016  . Pain in both hands 09/08/2016  . Hypertension associated with diabetes ([place])  09/08/2016  . Type 2 diabetes mellitus  09/08/2016  . Gastroesophageal reflux disease without esophagitis 09/08/2016  . Osteopenia of multiple sites 09/08/2016  . History of anxiety 09/08/2016  . Benign paroxysmal positional vertigo 09/08/2016  . Sleep apnea 09/08/2016   Past Medical History:  Diagnosis Date  . Acute cystitis    UTI  . Anxiety   . Arthritis   . Benign positional vertigo   . Dermatitis   . Diabetes mellitus without complication ([place])   . Family history of adverse reaction to anesthesia   . GERD (gastroesophageal reflux disease)   . Goiter, nontoxic, multinodular   . Gout   . H/O: hysterectomy   . History of tobacco use   . Hx laparoscopic cholecystectomy   . Hypercholesterolemia   . Hypertension   . Incomplete bladder emptying   . Insomnia   . Postmenopausal atrophic vaginitis   . RA (rheumatoid arthritis) ([place])   . Rectocele   . Renal calculus   . Sleep apnea   . Thyroid condition   . Type 2 diabetes mellitus ([place])   . Urolithiasis   . Vaginal enterocele     Family History  Problem Relation Age of Onset  . Diabetes Mother   . Heart disease Father 22  . Diabetes Son   . Crohn's disease Son     Past Surgical History:  Procedure Laterality Date  . CARPAL TUNNEL RELEASE  2018  . CATARACT EXTRACTION, BILATERAL    . CHOLECYSTECTOMY    . COLONOSCOPY     07/11/2009, 02/2015  . GALLBLADDER SURGERY    . KNEE ARTHROSCOPY Right 05/20/2020   Procedure: RIGHT KNEE ARTHROSCOPY AND DEBRIDEMENT;  Surgeon: [name], MD;  Location: [facility];  Service: Orthopedics;  Laterality: Right;  . PARTIAL HYSTERECTOMY     Overies left intact  . SQUAMOUS CELL CARCINOMA EXCISION  05/2018   Social History   Occupational History  . Not on file  Tobacco Use  . Smoking status: Former Smoker    Packs/day: 0.20    Years: 10.00    Pack years: 2.00    Types: Cigarettes    Quit date: 07/05/1978    Years since quitting: 42.2  . Smokeless tobacco: Never  Used  Vaping Use  . Vaping Use: Never used  Substance and Sexual Activity  . Alcohol use: No  . Drug use: No  . Sexual activity: Not Currently

## 2020-09-18 ENCOUNTER — Other Ambulatory Visit: Payer: Self-pay | Admitting: Orthopedic Surgery

## 2020-09-18 DIAGNOSIS — G4709 Other insomnia: Secondary | ICD-10-CM

## 2020-09-18 DIAGNOSIS — F419 Anxiety disorder, unspecified: Secondary | ICD-10-CM

## 2020-09-18 MED ORDER — LORAZEPAM 0.5 MG PO TABS: 0.2500 mg | ORAL_TABLET | Freq: Every evening | ORAL | 0 refills | Status: DC | PRN

## 2020-09-18 NOTE — Telephone Encounter (Signed)
New prescription sent for 0.25 mg. Thanks

## 2020-09-18 NOTE — Telephone Encounter (Signed)
Last filled 06/24/2020  No treatment agreement on file, notation made on pending appointment in May 2022

## 2020-09-23 ENCOUNTER — Ambulatory Visit: Payer: Medicare Other | Admitting: Cardiovascular Disease

## 2020-09-23 ENCOUNTER — Other Ambulatory Visit: Payer: Self-pay

## 2020-09-23 ENCOUNTER — Encounter: Payer: Self-pay | Admitting: Cardiovascular Disease

## 2020-09-23 VITALS — BP 130/71 | HR 95 | Ht 62.0 in | Wt 151.0 lb

## 2020-09-23 DIAGNOSIS — I10 Essential (primary) hypertension: Secondary | ICD-10-CM

## 2020-09-23 DIAGNOSIS — E785 Hyperlipidemia, unspecified: Secondary | ICD-10-CM

## 2020-09-23 DIAGNOSIS — I771 Stricture of artery: Secondary | ICD-10-CM

## 2020-09-23 NOTE — Patient Instructions (Signed)
Medication Instructions:  No Changes In Medications at this time.  *If you need a refill on your cardiac medications before your next appointment, please call your pharmacy*  Follow-Up: At [facility], you and your health needs are our priority.  As part of our continuing mission to provide you with exceptional heart care, we have created designated Provider Care Teams.  These Care Teams include your primary Cardiologist (physician) and Advanced Practice Providers (APPs -  Physician Assistants and Nurse Practitioners) who all work together to provide you with the care you need, when you need it.  We recommend signing up for the patient portal called "MyChart".  Sign up information is provided on this After Visit Summary.  MyChart is used to connect with patients for Virtual Visits (Telemedicine).  Patients are able to view lab/test results, encounter notes, upcoming appointments, etc.  Non-urgent messages can be sent to your provider as well.   To learn more about what you can do with MyChart, go to [email or web address].    Your next appointment:   AS NEEDED   The format for your next appointment:   In Person  Provider:   Kathlyn [place], MD

## 2020-09-23 NOTE — Progress Notes (Signed)
Cardiology Office Note   Date:  09/23/2020   ID:  Orel, Hord 24-Apr-1941, MRN [id number]  PCP:  [name], NP  Cardiologist: Dr. Gasper Sells  No chief complaint on file.     History of Present Illness: Annette Suarez is a 80 y.o. female who was referred by Dr. Gasper Sells for evaluation management of left subclavian artery stenosis.  She has known history of diabetes mellitus, essential hypertension, hyperlipidemia and obstructive sleep apnea. She and her husband noticed discrepancy in blood pressure readings between the right and left arm for many years.  She is right-handed and does not use her left hand that much.  She denies any left arm claudication.  No symptoms of dizziness, nausea or poor balance with using left arm.  She denies chest pain or shortness of breath.  No leg claudication.  She had recent cardiac studies which included an echocardiogram.  This showed hyperdynamic LV systolic function with no significant valvular abnormalities.  Carotid Doppler showed moderate left carotid stenosis.  Left subclavian artery was noted to be stenosed with retrograde flow in the left vertebral artery.  CTA of the chest and aorta was done in January which showed severe stenosis of the proximal left subclavian artery.  Past Medical History:  Diagnosis Date  . Acute cystitis    UTI  . Anxiety   . Arthritis   . Benign positional vertigo   . Dermatitis   . Diabetes mellitus without complication ([place])   . Family history of adverse reaction to anesthesia   . GERD (gastroesophageal reflux disease)   . Goiter, nontoxic, multinodular   . Gout   . H/O: hysterectomy   . History of tobacco use   . Hx laparoscopic cholecystectomy   . Hypercholesterolemia   . Hypertension   . Incomplete bladder emptying   . Insomnia   . Postmenopausal atrophic vaginitis   . RA (rheumatoid arthritis) ([place])   . Rectocele   . Renal calculus   . Sleep apnea   . Thyroid condition   . Type 2  diabetes mellitus ([place])   . Urolithiasis   . Vaginal enterocele     Past Surgical History:  Procedure Laterality Date  . CARPAL TUNNEL RELEASE  2018  . CATARACT EXTRACTION, BILATERAL    . CHOLECYSTECTOMY    . COLONOSCOPY     07/11/2009, 02/2015  . GALLBLADDER SURGERY    . KNEE ARTHROSCOPY Right 05/20/2020   Procedure: RIGHT KNEE ARTHROSCOPY AND DEBRIDEMENT;  Surgeon: [name], MD;  Location: [facility];  Service: Orthopedics;  Laterality: Right;  . PARTIAL HYSTERECTOMY     Overies left intact  . SQUAMOUS CELL CARCINOMA EXCISION  05/2018     Current Outpatient Medications  Medication Sig Dispense Refill  . Accu-Chek Softclix Lancets lancets Use to test blood sugar three times daily. Dx: E11.8 300 each 1  . amLODipine (NORVASC) 5 MG tablet Take 1 tablet (5 mg total) by mouth daily. 90 tablet 3  . aspirin EC 81 MG tablet Take 81 mg by mouth daily.    . Diclofenac Sodium (VOLTAREN EX) Apply 1 application topically 3 (three) times daily as needed.    Marland Kitchen estradiol (ESTRACE) 0.1 MG/GM vaginal cream Place 1 Applicatorful vaginally 3 (three) times a week. 42.5 g 0  . etanercept (ENBREL SURECLICK) 50 MG/ML injection Inject 50 mg into the skin once a week. 12 mL 0  . fluocinonide (LIDEX) 0.05 % external solution Apply 1 application topically  daily as needed.    Marland Kitchen glucose blood (ACCU-CHEK GUIDE) test strip Use to check blood sugar three times daily Dx: E11.8 300 strip 1  . ibuprofen (ADVIL) 200 MG tablet Take 200 mg by mouth as needed. For pain    . insulin aspart protamine- aspart (NOVOLOG MIX 70/30) (70-30) 100 UNIT/ML injection Inject 0.36 mLs (36 Units total) into the skin 2 (two) times daily with a meal. 10 mL 3  . irbesartan (AVAPRO) 300 MG tablet Take 1 tablet (300 mg total) by mouth daily. 90 tablet 1  . loperamide (IMODIUM A-D) 2 MG tablet Take 2 mg by mouth as needed for diarrhea or loose stools.    Marland Kitchen LORazepam (ATIVAN) 0.5 MG tablet Take 0.5 tablets (0.25 mg total)  by mouth at bedtime as needed for anxiety or sleep. 15 tablet 0  . MELATONIN PO Take 10 mg by mouth at bedtime as needed.    . Omeprazole 20 MG TBDD Take 20 mg by mouth daily.    . ondansetron (ZOFRAN) 4 MG tablet Take 4 mg by mouth 3 (three) times daily as needed for nausea or vomiting.    . Probiotic Product (PROBIOTIC PO) Take by mouth daily.    . simvastatin (ZOCOR) 20 MG tablet Take 1 tablet (20 mg total) by mouth every evening. 90 tablet 1   No current facility-administered medications for this visit.    Allergies:   Amaryl [glimepiride], Codeine, Jardiance [empagliflozin], Pneumovax 23 [pneumococcal vac polyvalent], Wellbutrin [bupropion], and Latex    Social History:  The patient  reports that she quit smoking about 42 years ago. Her smoking use included cigarettes. She has a 2.00 pack-year smoking history. She has never used smokeless tobacco. She reports that she does not drink alcohol and does not use drugs.   Family History:  The patient's family history includes Crohn's disease in her son; Diabetes in her mother and son; Heart disease (age of onset: 14) in her father.    ROS:  Please see the history of present illness.   Otherwise, review of systems are positive for none.   All other systems are reviewed and negative.    PHYSICAL EXAM: VS:  BP 130/71   Pulse 95   Ht 5\' 2"  (1.575 m)   Wt 151 lb (68.5 kg)   SpO2 97%   BMI 27.62 kg/m  , BMI Body mass index is 27.62 kg/m. GEN: Well nourished, well developed, in no acute distress  HEENT: normal  Neck: no JVD,  or masses.  Right carotid bruit.  There is a bruit in the left subclavian area. Cardiac: RRR; no  rubs, or gallops,no edema .  1 out of 6 systolic murmur in the aortic area Respiratory:  clear to auscultation bilaterally, normal work of breathing GI: soft, nontender, nondistended, + BS MS: no deformity or atrophy  Skin: warm and dry, no rash Neuro:  Strength and sensation are intact Psych: euthymic mood, full  affect Vascular: Radial pulses +2 on the right and +1 on the left.  Dorsalis pedis is +2 bilaterally.   EKG:  EKG is not ordered today.    Recent Labs: 06/03/2020: ALT 10; Hemoglobin 13.7; Platelets 386; TSH 1.67 06/11/2020: BUN 16; Creatinine, Ser 0.97; Potassium 4.6; Sodium 141    Lipid Panel    Component Value Date/Time   CHOL 126 01/22/2020 0000   TRIG 164 (A) 01/22/2020 0000   HDL 44 01/22/2020 0000   LDLCALC 55 01/22/2020 0000      Wt  Readings from Last 3 Encounters:  09/23/20 151 lb (68.5 kg)  09/11/20 163 lb (73.9 kg)  08/26/20 163 lb (73.9 kg)        No flowsheet data found.    ASSESSMENT AND PLAN:  1.  Left subclavian artery stenosis: The patient does have evidence of left subclavian artery stenosis based on exam showing a 20 mm systolic gradient between the right and left arm, diminished left radial pulse and also by duplex and Doppler which showed retrograde flow in the left vertebral artery.  However, the patient has no clinical symptoms related to this at all.  She has no left arm claudication.  In addition, in spite of Doppler evidence of subclavian steal, she does not have clinical symptoms related to this.  Based on this, there is no indication for revascularization. Recommend checking blood pressure in the right arm as this better reflect central aortic pressure. Continue low-dose aspirin and recommend aggressive treatment of risk factors.  2.  Essential hypertension: Blood pressure is mildly elevated in the right arm.  She was recently started on amlodipine but has been taking the medication only every other day because she was concerned about frequent urination.  I explained to her that amlodipine is not a diuretic and she should take the medication daily.  She should address frequent urination with her primary care physician.  3.  Hyperlipidemia: She is currently on simvastatin.  Lipid profile last year showed an LDL of 55.    Disposition:   FU with  me as needed if she develops symptoms related to her left subclavian artery stenosis.  Signed,  Kathlyn Sacramento, MD  09/23/2020 9:18 AM    [place]

## 2020-09-25 NOTE — Progress Notes (Signed)
Patient ID: Annette Suarez                 DOB: December 27, 1940                      MRN: [id number]     HPI: Annette Suarez is a 80 y.o. female referred by Dr. Gasper Sells to HTN clinic. PMH is significant for Diabetes and HTN, HLD, OSA not on CPAP. CTA of the chest and aorta was done in 07/2020 which showed severe stenosis of the proximal left subclavian artery. Echo at that time showed normal EF. Carotid doppler 07/16/20 showed moderate left carotid stenosis.  Left subclavian artery was noted to be stenosed with retrograde flow in the left vertebral artery.    Last visit with Dr. Gasper Sells 09/11/20, BP in left arm was 136/80, right arm 180/80. Added amlodipine 5 mg daily and referred to HTN clinic. BP 130/71 at 09/23/20 visit with Dr. Fletcher Anon for evaluation of left subclavian artery stenosis (20 mmHg systolic gradient between right and left arm). Asymptomatic, no indication for revascularization. Recommend checking BP in right arm to better reflect central aortic pressure. Reported only taking amlodipine every other day due to concerns of frequent urination and was advised to start taking 5mg  daily.  Today, patient arrives in good spirits accompanied by her husband. She reports she has been taking 2.5 mg of amlodipine in the mornings since yesterday after discussion with Dr. Fletcher Anon that this medication should not be increasing her nighttime urination since it is not a diuretic. She monitors her BP at home using a wrist cuff on right arm (lays arm across her chest) that was previously validated as reading accurately at visit with Dr. Gasper Sells. She denies dizziness, headache, blurred vision, swelling. She did not feel good yesterday so she checked her BP and it was 120/55 then checked her blood glucose which was low at 65 so she ate and felt better. Reports having some leg cramps that limits her activity. She has been drinking more Gatorade lately to help with this. Reports she rarely uses ibuprofen. Instead  will use Voltaren.    Current HTN meds: amlodipine 2.5 mg daily, irbesartan 300 mg daily  BP goal: <130/86mmHg  Family History: Diabetes in her mother and son; Heart disease (age of onset: 55) in her father  Social History: Former smoker (quit 1980)  Diet: Does not add salt to foods -Breakfast: eggs, 1 bacon, grits, 2 cups of coffee -Lunch: hot dog without bun; tangerine, cheese and crackers, coke zero -Dinner: pizza, gatorade -Snacks: salted nuts, chocolate pudding sugar free with cool whip -Drinks: 1 coke zero/day, unsweetened tea if at [employer], gatorade  Exercise: rides stationary bike at home  Home BP readings: Checks right arm with wrist (lays arm across chest) -Reading yesterday was 120/55 -She keeps a log but did not bring it with her today  Labs:  06/11/20: Scr 0.97, Na 141, K 4.6  Wt Readings from Last 3 Encounters:  09/23/20 151 lb (68.5 kg)  09/11/20 163 lb (73.9 kg)  08/26/20 163 lb (73.9 kg)   BP Readings from Last 3 Encounters:  09/23/20 130/71  09/11/20 (!) 192/88  08/26/20 120/70   Pulse Readings from Last 3 Encounters:  09/23/20 95  09/11/20 90  08/26/20 92    Renal function: CrCl cannot be calculated (Patient's most recent lab result is older than the maximum 21 days allowed.).  Past Medical History:  Diagnosis Date  . Acute cystitis  UTI  . Anxiety   . Arthritis   . Benign positional vertigo   . Dermatitis   . Diabetes mellitus without complication ([place])   . Family history of adverse reaction to anesthesia   . GERD (gastroesophageal reflux disease)   . Goiter, nontoxic, multinodular   . Gout   . H/O: hysterectomy   . History of tobacco use   . Hx laparoscopic cholecystectomy   . Hypercholesterolemia   . Hypertension   . Incomplete bladder emptying   . Insomnia   . Postmenopausal atrophic vaginitis   . RA (rheumatoid arthritis) ([place])   . Rectocele   . Renal calculus   . Sleep apnea   . Thyroid condition   . Type 2 diabetes  mellitus ([place])   . Urolithiasis   . Vaginal enterocele     Current Outpatient Medications on File Prior to Visit  Medication Sig Dispense Refill  . Accu-Chek Softclix Lancets lancets Use to test blood sugar three times daily. Dx: E11.8 300 each 1  . amLODipine (NORVASC) 5 MG tablet Take 1 tablet (5 mg total) by mouth daily. 90 tablet 3  . aspirin EC 81 MG tablet Take 81 mg by mouth daily.    . Diclofenac Sodium (VOLTAREN EX) Apply 1 application topically 3 (three) times daily as needed.    Marland Kitchen estradiol (ESTRACE) 0.1 MG/GM vaginal cream Place 1 Applicatorful vaginally 3 (three) times a week. 42.5 g 0  . etanercept (ENBREL SURECLICK) 50 MG/ML injection Inject 50 mg into the skin once a week. 12 mL 0  . fluocinonide (LIDEX) 0.05 % external solution Apply 1 application topically daily as needed.    Marland Kitchen glucose blood (ACCU-CHEK GUIDE) test strip Use to check blood sugar three times daily Dx: E11.8 300 strip 1  . ibuprofen (ADVIL) 200 MG tablet Take 200 mg by mouth as needed. For pain    . insulin aspart protamine- aspart (NOVOLOG MIX 70/30) (70-30) 100 UNIT/ML injection Inject 0.36 mLs (36 Units total) into the skin 2 (two) times daily with a meal. 10 mL 3  . irbesartan (AVAPRO) 300 MG tablet Take 1 tablet (300 mg total) by mouth daily. 90 tablet 1  . loperamide (IMODIUM A-D) 2 MG tablet Take 2 mg by mouth as needed for diarrhea or loose stools.    Marland Kitchen LORazepam (ATIVAN) 0.5 MG tablet Take 0.5 tablets (0.25 mg total) by mouth at bedtime as needed for anxiety or sleep. 15 tablet 0  . MELATONIN PO Take 10 mg by mouth at bedtime as needed.    . Omeprazole 20 MG TBDD Take 20 mg by mouth daily.    . ondansetron (ZOFRAN) 4 MG tablet Take 4 mg by mouth 3 (three) times daily as needed for nausea or vomiting.    . Probiotic Product (PROBIOTIC PO) Take by mouth daily.    . simvastatin (ZOCOR) 20 MG tablet Take 1 tablet (20 mg total) by mouth every evening. 90 tablet 1   No current facility-administered  medications on file prior to visit.    Allergies  Allergen Reactions  . Amaryl [Glimepiride]     Don't Remember  . Codeine     SICK  . Jardiance [Empagliflozin]     Urinary tract infection- SIDE EFFECT  . Pneumovax 23 [Pneumococcal Vac Polyvalent]     Red / Swelling / Hot to touch SIDE EFFECTS  . Wellbutrin [Bupropion]     Unknown  . Latex Rash     Assessment/Plan:  1. Hypertension - Blood  pressure today in her right arm was 168/62 which is elevated above goal <130/80 mmHg. Patient originally only taking amlodipine 5 mg every other day because of concerns with nighttime urination. Now taking 2.5 mg daily since yesterday. Says that BP has come down at home after starting amlodipine. She has baseline nighttime urination. Educated patient that amlodipine is not a diuretic and should not increase urinary frequency. Will increase amlodipine to 5 mg daily, which she is willing to try since alternative antihypertensives would either be a diuretic or medications that require multiple times per day dosing. Continue irbesartan 300 mg daily. Reports diet contains multiple high sodium foods. Counseled patient on reading nutrition labels, to limit sodium to <2,000 mg/day, and discussed foods she is eating that are high in sodium vs foods she could switch to with lower sodium content. Patient surprised that Gatorade has 160 mg of sodium per bottle. Provided her with a handout on the DASH diet. Follow up phone call in 2 weeks to review home BP log. Could titrate amlodipine to 10 mg daily at that time if BP still not controlled.   Rebbeca Paul, PharmD PGY1 Pharmacy Resident 09/26/2020 4:18 PM

## 2020-09-26 ENCOUNTER — Ambulatory Visit (INDEPENDENT_AMBULATORY_CARE_PROVIDER_SITE_OTHER): Payer: Medicare Other | Admitting: Student-PharmD

## 2020-09-26 ENCOUNTER — Other Ambulatory Visit: Payer: Self-pay

## 2020-09-26 VITALS — BP 168/62 | HR 91

## 2020-09-26 DIAGNOSIS — E1159 Type 2 diabetes mellitus with other circulatory complications: Secondary | ICD-10-CM | POA: Diagnosis not present

## 2020-09-26 DIAGNOSIS — I152 Hypertension secondary to endocrine disorders: Secondary | ICD-10-CM

## 2020-09-26 NOTE — Patient Instructions (Signed)
It was nice to see you today!  Your goal blood pressure is less than 130/80 mmHg. In clinic, your blood pressure was 168/62 mmHg.  Medication Changes: Increase amlodipine dose to 5 mg once daily in the morning  Continue irbesartan 300 mg daily  Monitor blood pressure at home daily and keep a log (on your phone or piece of paper) to bring with you to your next visit. Write down date, time, blood pressure and pulse.  We will call you in about 2 weeks to review your home blood pressure and how you are doing on the increased dose of amlodipine.   Aim for a diet full of vegetables, fruit and lean meats (chicken, [place], fish). Try to limit salt intake by eating fresh or frozen vegetables (instead of canned), rinse canned vegetables prior to cooking and do not add any additional salt to meals.   Please give [place] a call at [phone] with any questions or concerns.

## 2020-10-07 ENCOUNTER — Ambulatory Visit: Payer: Medicare Other | Admitting: Physician Assistant

## 2020-10-08 ENCOUNTER — Other Ambulatory Visit: Payer: Self-pay | Admitting: *Deleted

## 2020-10-08 ENCOUNTER — Ambulatory Visit: Payer: Medicare Other | Admitting: Physician Assistant

## 2020-10-08 ENCOUNTER — Telehealth: Payer: Self-pay

## 2020-10-08 ENCOUNTER — Encounter: Payer: Self-pay | Admitting: Physician Assistant

## 2020-10-08 DIAGNOSIS — M25561 Pain in right knee: Secondary | ICD-10-CM

## 2020-10-08 DIAGNOSIS — Z79899 Other long term (current) drug therapy: Secondary | ICD-10-CM

## 2020-10-08 DIAGNOSIS — M0609 Rheumatoid arthritis without rheumatoid factor, multiple sites: Secondary | ICD-10-CM | POA: Diagnosis not present

## 2020-10-08 DIAGNOSIS — M23203 Derangement of unspecified medial meniscus due to old tear or injury, right knee: Secondary | ICD-10-CM

## 2020-10-08 DIAGNOSIS — M19042 Primary osteoarthritis, left hand: Secondary | ICD-10-CM

## 2020-10-08 DIAGNOSIS — M19041 Primary osteoarthritis, right hand: Secondary | ICD-10-CM

## 2020-10-08 MED ORDER — ENBREL SURECLICK 50 MG/ML ~~LOC~~ SOAJ: 50.0000 mg | SUBCUTANEOUS | 0 refills | Status: DC

## 2020-10-08 NOTE — Telephone Encounter (Signed)
Angelica from [facility] called stating a new prescription is needed for patient's Enbrel medication.  Please fax to [phone]

## 2020-10-08 NOTE — Progress Notes (Signed)
Office Visit Note   Patient: Annette Suarez           Date of Birth: 04/18/1941           MRN: [id number] Visit Date: 10/08/2020              Requested by: [name], NP [phone],  [place] [id number] PCP: [name], NP  No chief complaint on file.     HPI: Is a pleasant 80 year old woman who is approximately 6 months status post right knee arthroscopy.  She has undergone a course of physical therapy she is using Voltaren gel topically.  She also exercises her knee every day.  She continues to have pain in her knee especially over the medial side.  She notices this especially if she has been out doing errands all day.  She has thought about knee replacement but would like to avoid that because of her diabetes if possible.  She had a steroid injection most recently that gave her good relief but only for about 2 weeks  Assessment & Plan: Visit Diagnoses: No diagnosis found.  Plan: Continue to talk about the importance of wearing good shoe wear and strength.  We will go forward and obtain authorization for viscosupplementation and contact her when this is been approved  Follow-Up Instructions: No follow-ups on file.   Ortho Exam  Patient is alert, oriented, no adenopathy, well-dressed, normal affect, normal respiratory effort. Right knee demonstrates no effusion no redness she does have some varus clinical deformity.  Tenderness to palpation more specifically over the medial joint line.  Some crepitation with range of motion.  No signs of infection  Imaging: No results found. No images are attached to the encounter.  Labs: Lab Results  Component Value Date   HGBA1C 8.0 (H) 06/03/2020   HGBA1C 7.5 01/22/2020   HGBA1C 8.1 11/26/2019   ESRSEDRATE 32 (H) 09/08/2016   LABURIC 5.7 09/12/2017     Lab Results  Component Value Date   ALBUMIN 4.6 07/25/2019   ALBUMIN 3.2 (L) 09/09/2017   ALBUMIN 3.9 09/08/2017    Lab Results  Component Value Date   MG 1.2  (L) 09/09/2017   No results found for: VD25OH  No results found for: PREALBUMIN CBC EXTENDED Latest Ref Rng & Units 06/03/2020 03/14/2020 12/31/2019  WBC 3.8 - 10.8 Thousand/uL 13.1(H) 13.1(H) 9.0  RBC 3.80 - 5.10 Million/uL 4.31 4.33 4.12  HGB 11.7 - 15.5 g/dL 13.7 13.5 12.9  HCT 35.0 - 45.0 % 39.6 40.1 37.7  PLT 140 - 400 Thousand/uL 386 357 292  NEUTROABS 1,500 - 7,800 cells/uL 9,406(H) 9,367(H) 5,652  LYMPHSABS 850 - 3,900 cells/uL 2,856 2,764 2,376     There is no height or weight on file to calculate BMI.  Orders:  No orders of the defined types were placed in this encounter.  No orders of the defined types were placed in this encounter.    Procedures: No procedures performed  Clinical Data: No additional findings.  ROS:  All other systems negative, except as noted in the HPI. Review of Systems  Objective: Vital Signs: There were no vitals taken for this visit.  Specialty Comments:  No specialty comments available.  PMFS History: Patient Active Problem List   Diagnosis Date Noted  . Subclavian arterial stenosis ([place]) 09/11/2020  . Aortic atherosclerosis ([place]) 09/11/2020  . Alteration in blood pressure 06/11/2020  . Old complex tear of medial meniscus of right knee   .  Old complex tear of lateral meniscus of right knee   . Primary osteoarthritis of both feet 12/13/2017  . Dehydration 09/09/2017  . Acute lower UTI 09/09/2017  . Nausea & vomiting 09/08/2017  . Primary osteoarthritis of both hands 04/05/2017  . Left carpal tunnel syndrome 11/15/2016  . Rheumatoid arthritis of multiple sites with negative rheumatoid factor ([place]) 10/06/2016  . High risk medication use 10/06/2016  . Dyslipidemia 10/06/2016  . Pain in both hands 09/08/2016  . Hypertension associated with diabetes ([place]) 09/08/2016  . Type 2 diabetes mellitus  09/08/2016  . Gastroesophageal reflux disease without esophagitis 09/08/2016  . Osteopenia of multiple sites 09/08/2016  . History of  anxiety 09/08/2016  . Benign paroxysmal positional vertigo 09/08/2016  . Sleep apnea 09/08/2016   Past Medical History:  Diagnosis Date  . Acute cystitis    UTI  . Anxiety   . Arthritis   . Benign positional vertigo   . Dermatitis   . Diabetes mellitus without complication ([place])   . Family history of adverse reaction to anesthesia   . GERD (gastroesophageal reflux disease)   . Goiter, nontoxic, multinodular   . Gout   . H/O: hysterectomy   . History of tobacco use   . Hx laparoscopic cholecystectomy   . Hypercholesterolemia   . Hypertension   . Incomplete bladder emptying   . Insomnia   . Postmenopausal atrophic vaginitis   . RA (rheumatoid arthritis) ([place])   . Rectocele   . Renal calculus   . Sleep apnea   . Thyroid condition   . Type 2 diabetes mellitus ([place])   . Urolithiasis   . Vaginal enterocele     Family History  Problem Relation Age of Onset  . Diabetes Mother   . Heart disease Father 43  . Diabetes Son   . Crohn's disease Son     Past Surgical History:  Procedure Laterality Date  . CARPAL TUNNEL RELEASE  2018  . CATARACT EXTRACTION, BILATERAL    . CHOLECYSTECTOMY    . COLONOSCOPY     07/11/2009, 02/2015  . GALLBLADDER SURGERY    . KNEE ARTHROSCOPY Right 05/20/2020   Procedure: RIGHT KNEE ARTHROSCOPY AND DEBRIDEMENT;  Surgeon: [name], MD;  Location: [facility];  Service: Orthopedics;  Laterality: Right;  . PARTIAL HYSTERECTOMY     Overies left intact  . SQUAMOUS CELL CARCINOMA EXCISION  05/2018   Social History   Occupational History  . Not on file  Tobacco Use  . Smoking status: Former Smoker    Packs/day: 0.20    Years: 10.00    Pack years: 2.00    Types: Cigarettes    Quit date: 07/05/1978    Years since quitting: 42.2  . Smokeless tobacco: Never Used  Vaping Use  . Vaping Use: Never used  Substance and Sexual Activity  . Alcohol use: No  . Drug use: No  . Sexual activity: Not Currently

## 2020-10-08 NOTE — Telephone Encounter (Signed)
Next Visit: 11/11/2020  Last Visit: 07/14/2020  Last Fill: 04/15/2020  DX:  Rheumatoid arthritis of multiple sites with negative rheumatoid factor  Current Dose per office note 07/14/2020, Enbrel 50 mg sq injections once weekly  Labs: 06/03/2021, WBC 13.1, Neutro Abs 9,406, A1C 8.0, Glucose 169, GFR 57,   Patient will come today for labs  TB Gold: 07/14/2020, negative  Okay to refill Enbrel?

## 2020-10-08 NOTE — Telephone Encounter (Signed)
Right knee gel injection  

## 2020-10-09 LAB — CBC WITH DIFFERENTIAL/PLATELET
Absolute Monocytes: 733 cells/uL (ref 200–950)
Basophils Absolute: 78 cells/uL (ref 0–200)
Basophils Relative: 0.7 %
Eosinophils Absolute: 255 cells/uL (ref 15–500)
Eosinophils Relative: 2.3 %
HCT: 37.9 % (ref 35.0–45.0)
Hemoglobin: 12.6 g/dL (ref 11.7–15.5)
Lymphs Abs: 2398 cells/uL (ref 850–3900)
MCH: 30.7 pg (ref 27.0–33.0)
MCHC: 33.2 g/dL (ref 32.0–36.0)
MCV: 92.2 fL (ref 80.0–100.0)
MPV: 10.5 fL (ref 7.5–12.5)
Monocytes Relative: 6.6 %
Neutro Abs: 7637 cells/uL (ref 1500–7800)
Neutrophils Relative %: 68.8 %
Platelets: 310 10*3/uL (ref 140–400)
RBC: 4.11 10*6/uL (ref 3.80–5.10)
RDW: 12.2 % (ref 11.0–15.0)
Total Lymphocyte: 21.6 %
WBC: 11.1 10*3/uL — ABNORMAL HIGH (ref 3.8–10.8)

## 2020-10-09 LAB — COMPLETE METABOLIC PANEL WITH GFR
AG Ratio: 1.6 (calc) (ref 1.0–2.5)
ALT: 12 U/L (ref 6–29)
AST: 11 U/L (ref 10–35)
Albumin: 4.1 g/dL (ref 3.6–5.1)
Alkaline phosphatase (APISO): 98 U/L (ref 37–153)
BUN/Creatinine Ratio: 24 (calc) — ABNORMAL HIGH (ref 6–22)
BUN: 23 mg/dL (ref 7–25)
CO2: 28 mmol/L (ref 20–32)
Calcium: 8.9 mg/dL (ref 8.6–10.4)
Chloride: 102 mmol/L (ref 98–110)
Creat: 0.97 mg/dL — ABNORMAL HIGH (ref 0.60–0.93)
GFR, Est African American: 65 mL/min/{1.73_m2} (ref >=60)
GFR, Est Non African American: 56 mL/min/{1.73_m2} — ABNORMAL LOW (ref >=60)
Globulin: 2.5 g/dL (calc) (ref 1.9–3.7)
Glucose, Bld: 233 mg/dL — ABNORMAL HIGH (ref 65–99)
Potassium: 5 mmol/L (ref 3.5–5.3)
Sodium: 139 mmol/L (ref 135–146)
Total Bilirubin: 1 mg/dL (ref 0.2–1.2)
Total Protein: 6.6 g/dL (ref 6.1–8.1)

## 2020-10-09 NOTE — Telephone Encounter (Signed)
Noted  

## 2020-10-09 NOTE — Progress Notes (Signed)
Creatinine is borderline elevated-0.97 and GFR is slightly low but stable. Please advise the patient to avoid taking NSAIDs.  Advil is listed on her mediation list.   Glucose is elevated-233.   WBC count is borderline elevated. Rest of CBC WNL.

## 2020-10-10 ENCOUNTER — Telehealth: Payer: Self-pay | Admitting: Pharmacist

## 2020-10-10 NOTE — Telephone Encounter (Signed)
Called pt to follow up with home BP readings since last visit. She reports they have been improving - 116/79, 135/80, 138/85, 144/61, 122/71, 115/70s, 106/70s. She's been trying to cut back on salt in her diet. Tolerating amlodipine 5mg  daily well, still on irbesartan 300mg  daily. Will continue current meds. Encouraged pt to continue monitoring/recording her BP, limit salt intake, and increase physical activity. Will call pt in another 3 weeks to ensure BP remains stable and that higher dose of amlodipine isn't needed.

## 2020-10-16 ENCOUNTER — Telehealth: Payer: Self-pay

## 2020-10-16 ENCOUNTER — Telehealth: Payer: Self-pay | Admitting: Orthopedic Surgery

## 2020-10-16 NOTE — Telephone Encounter (Signed)
Advised patient increase 70/30 insulin to 38 units twice daily. May increase by 2 units every three days if blood sugars > 150. Please watch carbs and sugars in diet, recommend < 90 carbs daily.

## 2020-10-16 NOTE — Telephone Encounter (Signed)
Incoming call received from patient requesting rx for metformin to assist with blood sugar control. Patient states her blood sugar is running 295-283 on average x 3-4 days.    Please advise

## 2020-10-16 NOTE — Telephone Encounter (Signed)
Below is the reply from Amy sent via routing comment:  I would advise her to increase 70/30 insulin by 2 units every 3 days until sugars are < 150. I currently have her at 36 units twice a day. Insulin will decrease sugars faster than metformin. Please limit carbs and sugars from diet. She should be eating < 90 carbs/day.    Patient aware of response, I had patient write down instructions and repeat x 2 to confirm comprehension.  Medication list updated to reflect new instructions

## 2020-10-28 DIAGNOSIS — M1990 Unspecified osteoarthritis, unspecified site: Secondary | ICD-10-CM | POA: Diagnosis not present

## 2020-10-28 DIAGNOSIS — I10 Essential (primary) hypertension: Secondary | ICD-10-CM | POA: Diagnosis not present

## 2020-10-28 DIAGNOSIS — H35033 Hypertensive retinopathy, bilateral: Secondary | ICD-10-CM | POA: Diagnosis not present

## 2020-10-28 DIAGNOSIS — E7800 Pure hypercholesterolemia, unspecified: Secondary | ICD-10-CM | POA: Diagnosis not present

## 2020-10-28 DIAGNOSIS — E119 Type 2 diabetes mellitus without complications: Secondary | ICD-10-CM | POA: Diagnosis not present

## 2020-10-28 DIAGNOSIS — K219 Gastro-esophageal reflux disease without esophagitis: Secondary | ICD-10-CM | POA: Diagnosis not present

## 2020-10-28 DIAGNOSIS — M069 Rheumatoid arthritis, unspecified: Secondary | ICD-10-CM | POA: Diagnosis not present

## 2020-10-28 DIAGNOSIS — G4700 Insomnia, unspecified: Secondary | ICD-10-CM | POA: Diagnosis not present

## 2020-11-03 ENCOUNTER — Telehealth: Payer: Self-pay | Admitting: Pharmacist

## 2020-11-03 ENCOUNTER — Telehealth: Payer: Self-pay | Admitting: Physician Assistant

## 2020-11-03 ENCOUNTER — Telehealth: Payer: Self-pay

## 2020-11-03 NOTE — Telephone Encounter (Signed)
Called pt to follow up with home BP readings on irbesartan 300mg  daily and amlodipine 5mg  daily. Home readings have been: 151/71, 131/68, 156/66, 126/63, 168/67, 124/73, 115/88. Thinks higher readings are in the evening around 7pm, better readings 7am. Takes amlodipine at 8:30am and irbesartan at 9pm. Eats dinner around 5pm, avoids adding salt to food. Will increase amlodipine to 7.5mg  daily and continue irbesartan 300mg  daily. Pt will continue to monitor her BP at home. I'll call her in 2-3 weeks to follow up with home readings.

## 2020-11-03 NOTE — Telephone Encounter (Signed)
Patient left a voicemail stating she has an appointment with [name] on Tuesday, 11/11/20 and is checking if she needs to come in for labwork prior to the appointment.

## 2020-11-03 NOTE — Telephone Encounter (Signed)
I called patient, no labs are due at this time.

## 2020-11-03 NOTE — Telephone Encounter (Signed)
Patient called asked if she has been approved for the gel injection? The number to contact patient is [phone]

## 2020-11-04 ENCOUNTER — Telehealth: Payer: Self-pay

## 2020-11-04 NOTE — Telephone Encounter (Signed)
VOB has been submitted for Durolane, right knee. Pending BV.

## 2020-11-04 NOTE — Telephone Encounter (Signed)
Talked with patient concerning gel injection.  Advised patient that I will give her a call once I receive a approval.  Patient voiced that she understands.

## 2020-11-11 ENCOUNTER — Ambulatory Visit: Payer: Medicare Other | Admitting: Physician Assistant

## 2020-11-11 DIAGNOSIS — M7061 Trochanteric bursitis, right hip: Secondary | ICD-10-CM

## 2020-11-11 DIAGNOSIS — I10 Essential (primary) hypertension: Secondary | ICD-10-CM

## 2020-11-11 DIAGNOSIS — Z8659 Personal history of other mental and behavioral disorders: Secondary | ICD-10-CM

## 2020-11-11 DIAGNOSIS — M8589 Other specified disorders of bone density and structure, multiple sites: Secondary | ICD-10-CM

## 2020-11-11 DIAGNOSIS — M19071 Primary osteoarthritis, right ankle and foot: Secondary | ICD-10-CM

## 2020-11-11 DIAGNOSIS — Z8669 Personal history of other diseases of the nervous system and sense organs: Secondary | ICD-10-CM

## 2020-11-11 DIAGNOSIS — Z79899 Other long term (current) drug therapy: Secondary | ICD-10-CM

## 2020-11-11 DIAGNOSIS — E118 Type 2 diabetes mellitus with unspecified complications: Secondary | ICD-10-CM

## 2020-11-11 DIAGNOSIS — R202 Paresthesia of skin: Secondary | ICD-10-CM

## 2020-11-11 DIAGNOSIS — Z87898 Personal history of other specified conditions: Secondary | ICD-10-CM

## 2020-11-11 DIAGNOSIS — M19041 Primary osteoarthritis, right hand: Secondary | ICD-10-CM

## 2020-11-11 DIAGNOSIS — M0609 Rheumatoid arthritis without rheumatoid factor, multiple sites: Secondary | ICD-10-CM

## 2020-11-11 DIAGNOSIS — K219 Gastro-esophageal reflux disease without esophagitis: Secondary | ICD-10-CM

## 2020-11-11 DIAGNOSIS — Z87891 Personal history of nicotine dependence: Secondary | ICD-10-CM

## 2020-11-11 DIAGNOSIS — Z8639 Personal history of other endocrine, nutritional and metabolic disease: Secondary | ICD-10-CM

## 2020-11-18 NOTE — Progress Notes (Signed)
Office Visit Note  Patient: Annette Suarez             Date of Birth: Apr 07, 1941           MRN: [id number]             PCP: [name], NP Referring: [name], NP Visit Date: 11/26/2020 Occupation: @GUAROCC @  Subjective:  Muscle tenderness  History of Present Illness: Annette Suarez is a 80 y.o. female with history of seronegative rheumatoid arthritis, osteoarthritis, and osteopenia.  She remains on Enbrel 50 mg subcutaneous injections once weekly.  She has not missed any doses of Enbrel recently.  She denies any injection site reactions or side effects.  She has not had any recent infections.  She denies any recent rheumatoid arthritis flares.  She has not noticed any joint swelling.  She continues to have chronic pain in the right knee joint after undergoing arthroscopic surgery in November 2021.  She completed physical therapy and underwent a Visco gel injection yesterday but has not noticed any improvement yet.  She states that she experiences muscle aches and muscle tenderness intermittently.  She is also been experiencing nocturnal muscle cramps in bilateral lower extremities.  She states that at times it feels as though her skin is sensitive to touch.  She takes Tylenol as needed for pain relief.    Activities of Daily Living:  Patient reports morning stiffness for 2-3 hours.   Patient Denies nocturnal pain.  Difficulty dressing/grooming: Denies Difficulty climbing stairs: Reports Difficulty getting out of chair: Denies Difficulty using hands for taps, buttons, cutlery, and/or writing: Denies  Review of Systems  Constitutional: Positive for fatigue.  HENT: Negative for mouth sores, mouth dryness and nose dryness.   Eyes: Positive for dryness. Negative for pain and itching.  Respiratory: Negative for shortness of breath and difficulty breathing.   Cardiovascular: Negative for chest pain and palpitations.  Gastrointestinal: Negative for blood in stool, constipation and  diarrhea.  Endocrine: Negative for increased urination.  Genitourinary: Positive for nocturia. Negative for difficulty urinating.  Musculoskeletal: Positive for arthralgias, joint pain, myalgias, morning stiffness, muscle tenderness and myalgias. Negative for joint swelling.  Skin: Negative for color change, rash and redness.  Allergic/Immunologic: Negative for susceptible to infections.  Neurological: Positive for numbness. Negative for dizziness, headaches, memory loss and weakness.  Hematological: Negative for bruising/bleeding tendency.  Psychiatric/Behavioral: Negative for confusion.    PMFS History:  Patient Active Problem List   Diagnosis Date Noted  . Subclavian arterial stenosis ([place]) 09/11/2020  . Aortic atherosclerosis ([place]) 09/11/2020  . Alteration in blood pressure 06/11/2020  . Old complex tear of medial meniscus of right knee   . Old complex tear of lateral meniscus of right knee   . Primary osteoarthritis of both feet 12/13/2017  . Dehydration 09/09/2017  . Acute lower UTI 09/09/2017  . Nausea & vomiting 09/08/2017  . Primary osteoarthritis of both hands 04/05/2017  . Left carpal tunnel syndrome 11/15/2016  . Rheumatoid arthritis of multiple sites with negative rheumatoid factor ([place]) 10/06/2016  . High risk medication use 10/06/2016  . Dyslipidemia 10/06/2016  . Pain in both hands 09/08/2016  . Hypertension associated with diabetes ([place]) 09/08/2016  . Type 2 diabetes mellitus  09/08/2016  . Gastroesophageal reflux disease without esophagitis 09/08/2016  . Osteopenia of multiple sites 09/08/2016  . History of anxiety 09/08/2016  . Benign paroxysmal positional vertigo 09/08/2016  . Sleep apnea 09/08/2016    Past Medical History:  Diagnosis Date  .  Acute cystitis    UTI  . Anxiety   . Arthritis   . Benign positional vertigo   . Dermatitis   . Diabetes mellitus without complication ([place])   . Family history of adverse reaction to anesthesia   . GERD  (gastroesophageal reflux disease)   . Goiter, nontoxic, multinodular   . Gout   . H/O: hysterectomy   . History of tobacco use   . Hx laparoscopic cholecystectomy   . Hypercholesterolemia   . Hypertension   . Incomplete bladder emptying   . Insomnia   . Postmenopausal atrophic vaginitis   . RA (rheumatoid arthritis) ([place])   . Rectocele   . Renal calculus   . Sleep apnea   . Thyroid condition   . Type 2 diabetes mellitus ([place])   . Urolithiasis   . Vaginal enterocele     Family History  Problem Relation Age of Onset  . Diabetes Mother   . Heart disease Father 33  . Diabetes Son   . Crohn's disease Son    Past Surgical History:  Procedure Laterality Date  . CARPAL TUNNEL RELEASE  2018  . CATARACT EXTRACTION, BILATERAL    . CHOLECYSTECTOMY    . COLONOSCOPY     07/11/2009, 02/2015  . GALLBLADDER SURGERY    . KNEE ARTHROSCOPY Right 05/20/2020   Procedure: RIGHT KNEE ARTHROSCOPY AND DEBRIDEMENT;  Surgeon: [name], MD;  Location: [facility];  Service: Orthopedics;  Laterality: Right;  . PARTIAL HYSTERECTOMY     [place] left intact  . SQUAMOUS CELL CARCINOMA EXCISION  05/2018   Social History   Social History Narrative   Diet: Blank      Do you drink/ eat things with caffeine? Yes      Marital status:  Married                             What year were you married ? 196      Do you live in a house, apartment,assistred living, condo, trailer, etc.)? House      Is it one or more stories? 1      How many persons live in your home ? 2      Do you have any pets in your home ?(please list)  1 Sam-Dog      Highest Level of education completed: 12      Current or past profession: Office      Do you exercise?   Some                           Type & how often  Walk Daily      ADVANCED DIRECTIVES (Please bring copies)      Do you have a living will? Blank      Do you have a DNR form?  Blank                     If not, do you want to discuss one?        Do you have signed POA?HPOA forms?  Yes               If so, please bring to your appointment      FUNCTIONAL STATUS- To be completed by Spouse / child / Staff       Do you have difficulty bathing or dressing yourself ?  No  Do you have difficulty preparing food or eating ?  No      Do you have difficulty managing your mediation ?  No      Do you have difficulty managing your finances ?  No      Do you have difficulty affording your medication ?  Yes (Some)      Immunization History  Administered Date(s) Administered  . Influenza Split 03/28/2008, 03/05/2010, 04/17/2012, 04/20/2013  . Influenza, High Dose Seasonal PF 02/08/2020  . Influenza-Unspecified 05/11/2011, 04/22/2014, 04/15/2015, 02/20/2016, 03/02/2017, 03/09/2018, 03/20/2019, 03/21/2020  . PFIZER(Purple Top)SARS-COV-2 Vaccination 07/24/2019, 08/06/2019, 08/27/2019, 04/21/2020  . Pneumococcal Conjugate-13 05/02/2014  . Pneumococcal Polysaccharide-23 04/23/2008  . Tdap 05/01/2013  . Zoster 04/17/2012     Objective: Vital Signs: BP (!) 161/72 (BP Location: Right Arm, Patient Position: Sitting, Cuff Size: Normal)   Pulse 92   Resp 15   Ht 5\' 2"  (1.575 m)   Wt 166 lb 6.4 oz (75.5 kg)   BMI 30.43 kg/m    Physical Exam Vitals and nursing note reviewed.  Constitutional:      Appearance: She is well-developed.  HENT:     Head: Normocephalic and atraumatic.  Eyes:     Conjunctiva/sclera: Conjunctivae normal.  Pulmonary:     Effort: Pulmonary effort is normal.  Abdominal:     Palpations: Abdomen is soft.  Musculoskeletal:     Cervical back: Normal range of motion.  Skin:    General: Skin is warm and dry.     Capillary Refill: Capillary refill takes less than 2 seconds.  Neurological:     Mental Status: She is alert and oriented to person, place, and time.  Psychiatric:        Behavior: Behavior normal.      Musculoskeletal Exam: C-spine has slightly limited range of motion with lateral rotation.   Trapezius muscle tension and muscle tenderness bilaterally.  Postural thoracic kyphosis noted.  Midline spinal tenderness in the lumbar region.  No SI joint tenderness.  Shoulder joints, elbow joints, wrist joints, MCPs, PIPs, DIPs have good range of motion with no synovitis.  She has PIP and DIP thickening consistent with osteoarthritis of both hands.  Complete fist formation bilaterally.  Hip joints have good range of motion with no discomfort.  Painful range of motion and warmth of the right knee noted.  Left knee has good range of motion with no warmth or effusion.  Ankle joints have good range of motion with no joint tenderness.  Pedal edema noted bilaterally.  CDAI Exam: CDAI Score: 0.9  Patient Global: 7 mm; Provider Global: 2 mm Swollen: 0 ; Tender: 0  Joint Exam 11/26/2020   No joint exam has been documented for this visit   There is currently no information documented on the homunculus. Go to the Rheumatology activity and complete the homunculus joint exam.  Investigation: No additional findings.  Imaging: No results found.  Recent Labs: Lab Results  Component Value Date   WBC 11.1 (H) 10/08/2020   HGB 12.6 10/08/2020   PLT 310 10/08/2020   NA 139 10/08/2020   K 5.0 10/08/2020   CL 102 10/08/2020   CO2 28 10/08/2020   GLUCOSE 233 (H) 10/08/2020   BUN 23 10/08/2020   CREATININE 0.97 (H) 10/08/2020   BILITOT 1.0 10/08/2020   ALKPHOS 83 07/25/2019   AST 11 10/08/2020   ALT 12 10/08/2020   PROT 6.6 10/08/2020   ALBUMIN 4.6 07/25/2019   CALCIUM 8.9 10/08/2020   GFRAA 65 10/08/2020  QFTBGOLDPLUS NEGATIVE 07/14/2020    Speciality Comments: Recieves Enbrel through [facility] 02/20/2019 Osteopenia managed by Dr. Annice Pih 02/20/2019  Procedures:  No procedures performed Allergies: Amaryl [glimepiride], Codeine, Jardiance [empagliflozin], Pneumovax 23 [pneumococcal vac polyvalent], Wellbutrin [bupropion], and Latex   Assessment / Plan:     Visit  Diagnoses: Rheumatoid arthritis of multiple sites with negative rheumatoid factor ([place]): She has no joint tenderness or synovitis on examination today.  She has not had any recent rheumatoid arthritis flares.  She is clinically doing well on Enbrel 50 mg subcutaneous injections once weekly.  She has not missed any doses of Enbrel and has not had any side effects or injection site reactions.  She continues to experience chronic pain in the right knee joint after undergoing arthroscopic surgery in November 2021.  She has completed physical therapy and had a Visco gel injection performed in the right knee yesterday.  She plans on continuing to use Voltaren gel topically as needed for pain relief.  She is also been experiencing increased myalgias and muscle tenderness consistent with myofascial pain.  We discussed the importance of regular exercise, stretching, and the use of topical agents for pain relief.  Her rheumatoid arthritis is well controlled on the current treatment regimen.  She will remain on Enbrel as monotherapy.  She was advised to notify us if she develops increased joint pain or joint swelling.  She will follow-up in the office in 5 months.  High risk medication use - Enbrel 50 mg sq injections once weekly. (inadequate response to [place]).CBC and CMP were drawn on 10/08/2020.  She will be due to update lab work in July and every 3 months to monitor for drug toxicity.  TB gold was negative on 07/14/2020 and will continue to be monitored yearly. She has not had any recent infections.  Discussed the importance of holding Enbrel if she develops signs or symptoms of an infection and to resume once the infection has completely cleared. She has received 4 Pfizer COVID-19 vaccine doses.  Primary osteoarthritis of both hands: She has PIP and DIP thickening consistent with osteoarthritis of both hands.  No joint tenderness or inflammation was noted.  She was able to make complete fist bilaterally.  She  continues to experience morning stiffness in both hands on a daily basis.  Discussed the importance of joint protection and muscle strengthening.  Primary osteoarthritis of both feet: She is not experiencing any discomfort in her feet at this time.  She has good range of motion of both ankle joints with no joint tenderness.  Pedal edema noted bilaterally.  Osteopenia of multiple sites - Managed by her PCP-DEXA 02/20/19  Trochanteric bursitis, right hip: Resolved  Nocturnal muscle cramps: She is experiencing nocturnal muscle cramps in bilateral lower extremities.  We discussed starting on magnesium malate 250 mg at bedtime and to increase to 500 mg at bedtime as tolerated.  We discussed the potential side effects for diarrhea as well as drowsiness.  Other medical conditions are listed as follows:  Gastroesophageal reflux disease without esophagitis  History of vertigo  History of sleep apnea  Essential hypertension  Type 2 diabetes mellitus   History of hyperlipidemia  History of anxiety  Former smoker  Orders: No orders of the defined types were placed in this encounter.  No orders of the defined types were placed in this encounter.   Follow-Up Instructions: Return in about 5 months (around 04/28/2021) for Rheumatoid arthritis, Osteoarthritis.   Ofilia Neas, PA-C  Note - This record has been created using [employer].  Chart creation errors have been sought, but may not always  have been located. Such creation errors do not reflect on  the standard of medical care.

## 2020-11-19 ENCOUNTER — Telehealth: Payer: Self-pay | Admitting: Pharmacist

## 2020-11-19 ENCOUNTER — Ambulatory Visit (INDEPENDENT_AMBULATORY_CARE_PROVIDER_SITE_OTHER): Payer: Medicare Other | Admitting: Family Medicine

## 2020-11-19 ENCOUNTER — Encounter: Payer: Self-pay | Admitting: Family Medicine

## 2020-11-19 ENCOUNTER — Other Ambulatory Visit: Payer: Self-pay

## 2020-11-19 VITALS — BP 142/58 | HR 91 | Temp 97.1°F | Ht 62.0 in | Wt 162.4 lb

## 2020-11-19 DIAGNOSIS — M0609 Rheumatoid arthritis without rheumatoid factor, multiple sites: Secondary | ICD-10-CM

## 2020-11-19 DIAGNOSIS — E118 Type 2 diabetes mellitus with unspecified complications: Secondary | ICD-10-CM | POA: Diagnosis not present

## 2020-11-19 DIAGNOSIS — Z794 Long term (current) use of insulin: Secondary | ICD-10-CM | POA: Diagnosis not present

## 2020-11-19 DIAGNOSIS — E1159 Type 2 diabetes mellitus with other circulatory complications: Secondary | ICD-10-CM | POA: Diagnosis not present

## 2020-11-19 DIAGNOSIS — I771 Stricture of artery: Secondary | ICD-10-CM | POA: Diagnosis not present

## 2020-11-19 DIAGNOSIS — I152 Hypertension secondary to endocrine disorders: Secondary | ICD-10-CM

## 2020-11-19 NOTE — Telephone Encounter (Addendum)
Called pt to follow up with BP readings. Per note with PCP from earlier today, pt noted increased LE edema after increasing amlodipine from 5mg  to 7.5mg  daily. Swelling resolved when she decreased her dose back to 5mg  daily, however as a result her BP remained elevated at her visit today at 142/58 and she was advised to alternate 5mg /7.5mg  every other day.  She took the higher 7.5mg  dose for a week from 5/2 - 5/16, home readings as follows:  5/2: 128/97 AM, 118/88 PM 5/3: 141/71 AM, 138/64 PM 5/4: 123/98 AM 5/6: 106/75 AM 5/7: 131/66 AM 5/8: 151/67 AM 5/9: 166/52 AM 5/10: 137/80 PM 5/11: 144/71 AM, 153/72 PM 5/12: 119/65 AM 5/13: 147/75 AM 5/14: 122/73 AM 5/16: 144/69 AM 5/17: 141/72 PM - reduced back to 5mg  of amlodipine 5/18: 136/74 AM  She already takes irbesartan 300mg  daily for BP. She has been concerned about frequent urination at night, have been avoiding thiazides and aldosterone antagonists for BP control because of this. Other options would be either hydralazine or carvedilol, neither of which is ideal given multiple dosing per day. Discussed options with pt - she prefers to try the 5mg /7.5mg  alternating doses of amlodipine for the next week to see how she tolerates this dose. Will call pt at that time to assess BP response. Discussed trying low dose chlorthalidone 12.5mg  daily if her readings remain elevated or her swelling returned on this dose which she is agreeable to.

## 2020-11-19 NOTE — Patient Instructions (Addendum)
Decrease  Evening dose insulin by 4 units and monitor Alternate amlodipine 5 and 7.5 on even and odd days and monitor BP Follow up with cardiology about BP med

## 2020-11-19 NOTE — Addendum Note (Signed)
Addended by: Wardell Honour on: 11/19/2020 11:28 AM   Modules accepted: Orders

## 2020-11-19 NOTE — Progress Notes (Signed)
Provider:  Alain Honey, MD  Careteam: Patient Care Team: [name], NP as PCP - General (Adult Health Nurse Practitioner)  PLACE OF SERVICE:  [place] Directive information    Allergies  Allergen Reactions  . Amaryl [Glimepiride]     Don't Remember  . Codeine     SICK  . Jardiance [Empagliflozin]     Urinary tract infection- SIDE EFFECT  . Pneumovax 23 [Pneumococcal Vac Polyvalent]     Red / Swelling / Hot to touch SIDE EFFECTS  . Wellbutrin [Bupropion]     Unknown  . Latex Rash    Chief Complaint  Patient presents with  . Acute Visit    Leg swelling. Patient reports the swelling happened after the pharmacist at her cardiology office advised her to increase her amlodipine dose to 7.5 MG. She reports that after going back to 7 MG she noticed the swelling went away.     HPI: Patient is a 80 y.o. female patient presents today as she is having some dependent edema when she increases her amlodipine from 5 to 7.5 mg.  Apparently the pharmacist had asked her to do this.  Not sure if the cardiologist was consulted.  She is monitoring her pressure and generally the pressures have been in acceptable range although with her diabetes goal may be a little lower.  Cardiologist would like it to be 130/80.  She also monitors her blood sugar and has noted some low sugars especially at nighttime.  She currently takes 45 units of 70/30 insulin twice a day.  She is also interesting it and getting a monitor for her blood sugars so she does not have to prick her finger as much.  Review of Systems:  Review of Systems  Cardiovascular: Positive for leg swelling.  All other systems reviewed and are negative.   Past Medical History:  Diagnosis Date  . Acute cystitis    UTI  . Anxiety   . Arthritis   . Benign positional vertigo   . Dermatitis   . Diabetes mellitus without complication ([place])   . Family history of adverse reaction to anesthesia   . GERD  (gastroesophageal reflux disease)   . Goiter, nontoxic, multinodular   . Gout   . H/O: hysterectomy   . History of tobacco use   . Hx laparoscopic cholecystectomy   . Hypercholesterolemia   . Hypertension   . Incomplete bladder emptying   . Insomnia   . Postmenopausal atrophic vaginitis   . RA (rheumatoid arthritis) ([place])   . Rectocele   . Renal calculus   . Sleep apnea   . Thyroid condition   . Type 2 diabetes mellitus ([place])   . Urolithiasis   . Vaginal enterocele    Past Surgical History:  Procedure Laterality Date  . CARPAL TUNNEL RELEASE  2018  . CATARACT EXTRACTION, BILATERAL    . CHOLECYSTECTOMY    . COLONOSCOPY     07/11/2009, 02/2015  . GALLBLADDER SURGERY    . KNEE ARTHROSCOPY Right 05/20/2020   Procedure: RIGHT KNEE ARTHROSCOPY AND DEBRIDEMENT;  Surgeon: [name], MD;  Location: [facility];  Service: Orthopedics;  Laterality: Right;  . PARTIAL HYSTERECTOMY     Overies left intact  . SQUAMOUS CELL CARCINOMA EXCISION  05/2018   Social History:   reports that she quit smoking about 42 years ago. Her smoking use included cigarettes. She has a 2.00 pack-year smoking history. She has never used smokeless tobacco.  She reports that she does not drink alcohol and does not use drugs.  Family History  Problem Relation Age of Onset  . Diabetes Mother   . Heart disease Father 30  . Diabetes Son   . Crohn's disease Son     Medications: Patient's Medications  New Prescriptions   No medications on file  Previous Medications   ACCU-CHEK SOFTCLIX LANCETS LANCETS    Use to test blood sugar three times daily. Dx: E11.8   AMLODIPINE (NORVASC) 5 MG TABLET    Take 1.5 tablets (7.5 mg total) by mouth daily.   ASPIRIN EC 81 MG TABLET    Take 81 mg by mouth daily.   DICLOFENAC SODIUM (VOLTAREN EX)    Apply 1 application topically 3 (three) times daily as needed.   ESTRADIOL (ESTRACE) 0.1 MG/GM VAGINAL CREAM    Place 1 Applicatorful vaginally 3 (three) times a  week.   ETANERCEPT (ENBREL SURECLICK) 50 MG/ML INJECTION    Inject 50 mg into the skin once a week.   FLUOCINONIDE (LIDEX) 0.05 % EXTERNAL SOLUTION    Apply 1 application topically daily as needed.   GLUCOSE BLOOD (ACCU-CHEK GUIDE) TEST STRIP    Use to check blood sugar three times daily Dx: E11.8   IBUPROFEN (ADVIL) 200 MG TABLET    Take 200 mg by mouth as needed. For pain   INSULIN ASPART PROTAMINE- ASPART (NOVOLOG MIX 70/30) (70-30) 100 UNIT/ML INJECTION    Inject 36 Units into the skin in the morning and at bedtime. Increase by 2 units every 3 days until sugars are < 150   IRBESARTAN (AVAPRO) 300 MG TABLET    Take 1 tablet (300 mg total) by mouth daily.   LOPERAMIDE (IMODIUM A-D) 2 MG TABLET    Take 2 mg by mouth as needed for diarrhea or loose stools.   LORAZEPAM (ATIVAN) 0.5 MG TABLET    Take 0.5 tablets (0.25 mg total) by mouth at bedtime as needed for anxiety or sleep.   MELATONIN PO    Take 10 mg by mouth at bedtime as needed.   OMEPRAZOLE 20 MG TBDD    Take 20 mg by mouth daily.   ONDANSETRON (ZOFRAN) 4 MG TABLET    Take 4 mg by mouth 3 (three) times daily as needed for nausea or vomiting.   PROBIOTIC PRODUCT (PROBIOTIC PO)    Take by mouth daily.   SIMVASTATIN (ZOCOR) 20 MG TABLET    Take 1 tablet (20 mg total) by mouth every evening.  Modified Medications   No medications on file  Discontinued Medications   No medications on file    Physical Exam:  Vitals:   11/19/20 0942  BP: (!) 142/58  Pulse: 91  Temp: (!) 97.1 F (36.2 C)  TempSrc: Temporal  SpO2: 98%  Weight: 162 lb 6.4 oz (73.7 kg)  Height: 5\' 2"  (1.575 m)   Body mass index is 29.7 kg/m. Wt Readings from Last 3 Encounters:  11/19/20 162 lb 6.4 oz (73.7 kg)  09/23/20 151 lb (68.5 kg)  09/11/20 163 lb (73.9 kg)    Physical Exam Vitals and nursing note reviewed.  Cardiovascular:     Rate and Rhythm: Normal rate and regular rhythm.     Comments: Pulses are diminished in left upper extremity compared to right  consistent with known stenosis Pulmonary:     Effort: Pulmonary effort is normal.     Breath sounds: Normal breath sounds.  Neurological:     General: No focal  deficit present.     Mental Status: She is alert.     Labs reviewed: Basic Metabolic Panel: Recent Labs    01/22/20 0000 03/14/20 1123 06/03/20 0000 06/11/20 1036 10/08/20 1341  NA  --    < > 140 141 139  K  --    < > 4.2 4.6 5.0  CL  --    < > 102 101 102  CO2  --    < > 27 24 28   GLUCOSE  --    < > 182* 169* 233*  BUN  --    < > 20 16 23   CREATININE  --    < > 1.06* 0.97 0.97*  CALCIUM  --    < > 9.5 9.4 8.9  TSH 2.25  --  1.67  --   --    < > = values in this interval not displayed.   Liver Function Tests: Recent Labs    03/14/20 1123 06/03/20 0000 10/08/20 1341  AST 9* 12 11  ALT 9 10 12   BILITOT 1.0 1.0 1.0  PROT 6.7 7.3 6.6   No results for input(s): LIPASE, AMYLASE in the last 8760 hours. No results for input(s): AMMONIA in the last 8760 hours. CBC: Recent Labs    03/14/20 1123 06/03/20 0000 10/08/20 1341  WBC 13.1* 13.1* 11.1*  NEUTROABS 9,367* 9,406* 7,637  HGB 13.5 13.7 12.6  HCT 40.1 39.6 37.9  MCV 92.6 91.9 92.2  PLT 357 386 310   Lipid Panel: Recent Labs    01/22/20 0000  CHOL 126  HDL 44  LDLCALC 55  TRIG 164*   TSH: Recent Labs    01/22/20 0000 06/03/20 0000  TSH 2.25 1.67   A1C: Lab Results  Component Value Date   HGBA1C 8.0 (H) 06/03/2020     Assessment/Plan  1. Hypertension associated with diabetes [facility]) Since there is a cardiology consultant involved.  Will defer final decision to him but I have suggested to alternate 5 mg with 7.5 mg on even and days and continue to monitor pressure for the time being  2. Subclavian arterial stenosis [facility]) Patient is not a surgical candidate.  Continue to monitor pressure in right arm as readings on left arm will be falsely higher because of the stenosis  3. Type 2 diabetes mellitus  A1c is not at goal at 8.  She does  report some nighttime lows but I did not see any below 89.  We mutually decided to decrease evening dose of insulin from 45 to 41 units and continue monitoring  4. Rheumatoid arthritis of multiple sites with negative rheumatoid factor ([place]) She feels like her Enbrel is not working as well as it did.  I have asked her to talk to her rheumatologist about potentially changing to a different medicine.   Alain Honey, MD [facility] [phone]

## 2020-11-20 ENCOUNTER — Telehealth: Payer: Self-pay

## 2020-11-20 NOTE — Telephone Encounter (Signed)
Approved Durolane Right Knee Buy&Bill Patient will be responsible for 20% oop $30 Co Pay No PA Required

## 2020-11-25 ENCOUNTER — Ambulatory Visit: Payer: Medicare Other | Admitting: Physician Assistant

## 2020-11-25 ENCOUNTER — Ambulatory Visit: Payer: Medicare Other | Admitting: Orthopedic Surgery

## 2020-11-25 ENCOUNTER — Encounter: Payer: Self-pay | Admitting: Physician Assistant

## 2020-11-25 DIAGNOSIS — M25561 Pain in right knee: Secondary | ICD-10-CM

## 2020-11-25 MED ORDER — SODIUM HYALURONATE 60 MG/3ML IX PRSY
60.0000 mg | PREFILLED_SYRINGE | INTRA_ARTICULAR | Status: AC | PRN
  Administered 2020-11-25: 60 mg via INTRA_ARTICULAR

## 2020-11-25 MED ORDER — LIDOCAINE HCL 1 % IJ SOLN
1.0000 mL | INTRAMUSCULAR | Status: AC | PRN
  Administered 2020-11-25: 1 mL

## 2020-11-25 NOTE — Progress Notes (Signed)
Office Visit Note   Patient: Annette Suarez           Date of Birth: Jan 04, 1941           MRN: [id number] Visit Date: 11/25/2020              Requested by: [name], NP [phone],  [place] [id number] PCP: [name], NP  Chief Complaint  Patient presents with  . Right Knee - Follow-up    Durolane buy and bill       HPI: Patient presents in follow-up for her right knee.  She has a history of right knee arthritis.  She has been approved for viscosupplementation  Assessment & Plan: Visit Diagnoses: No diagnosis found.  Plan: May follow-up as needed  Follow-Up Instructions: No follow-ups on file.   Ortho Exam  Patient is alert, oriented, no adenopathy, well-dressed, normal affect, normal respiratory effort. Right knee no effusion no cellulitis no signs of infection she has varus malalignment with tenderness more over the medial than lateral joint line  Imaging: No results found. No images are attached to the encounter.  Labs: Lab Results  Component Value Date   HGBA1C 8.0 (H) 06/03/2020   HGBA1C 7.5 01/22/2020   HGBA1C 8.1 11/26/2019   ESRSEDRATE 32 (H) 09/08/2016   LABURIC 5.7 09/12/2017     Lab Results  Component Value Date   ALBUMIN 4.6 07/25/2019   ALBUMIN 3.2 (L) 09/09/2017   ALBUMIN 3.9 09/08/2017    Lab Results  Component Value Date   MG 1.2 (L) 09/09/2017   No results found for: VD25OH  No results found for: PREALBUMIN CBC EXTENDED Latest Ref Rng & Units 10/08/2020 06/03/2020 03/14/2020  WBC 3.8 - 10.8 Thousand/uL 11.1(H) 13.1(H) 13.1(H)  RBC 3.80 - 5.10 Million/uL 4.11 4.31 4.33  HGB 11.7 - 15.5 g/dL 12.6 13.7 13.5  HCT 35.0 - 45.0 % 37.9 39.6 40.1  PLT 140 - 400 Thousand/uL 310 386 357  NEUTROABS 1,500 - 7,800 cells/uL 7,637 9,406(H) 9,367(H)  LYMPHSABS 850 - 3,900 cells/uL 2,398 2,856 2,764     There is no height or weight on file to calculate BMI.  Orders:  No orders of the defined types were placed in this  encounter.  No orders of the defined types were placed in this encounter.    Procedures: Large Joint Inj: R knee on 11/25/2020 2:28 PM Indications: pain and diagnostic evaluation Details: 22 G 1.5 in needle, anteromedial approach  Arthrogram: No  Medications: 1 mL lidocaine 1 %; 60 mg Sodium Hyaluronate 60 MG/3ML Outcome: tolerated well, no immediate complications Procedure, treatment alternatives, risks and benefits explained, specific risks discussed. Consent was given by the patient.      Clinical Data: No additional findings.  ROS:  All other systems negative, except as noted in the HPI. Review of Systems  Objective: Vital Signs: There were no vitals taken for this visit.  Specialty Comments:  No specialty comments available.  PMFS History: Patient Active Problem List   Diagnosis Date Noted  . Subclavian arterial stenosis ([place]) 09/11/2020  . Aortic atherosclerosis ([place]) 09/11/2020  . Alteration in blood pressure 06/11/2020  . Old complex tear of medial meniscus of right knee   . Old complex tear of lateral meniscus of right knee   . Primary osteoarthritis of both feet 12/13/2017  . Dehydration 09/09/2017  . Acute lower UTI 09/09/2017  . Nausea & vomiting 09/08/2017  . Primary osteoarthritis of both hands 04/05/2017  .  Left carpal tunnel syndrome 11/15/2016  . Rheumatoid arthritis of multiple sites with negative rheumatoid factor ([place]) 10/06/2016  . High risk medication use 10/06/2016  . Dyslipidemia 10/06/2016  . Pain in both hands 09/08/2016  . Hypertension associated with diabetes ([place]) 09/08/2016  . Type 2 diabetes mellitus  09/08/2016  . Gastroesophageal reflux disease without esophagitis 09/08/2016  . Osteopenia of multiple sites 09/08/2016  . History of anxiety 09/08/2016  . Benign paroxysmal positional vertigo 09/08/2016  . Sleep apnea 09/08/2016   Past Medical History:  Diagnosis Date  . Acute cystitis    UTI  . Anxiety   . Arthritis   .  Benign positional vertigo   . Dermatitis   . Diabetes mellitus without complication ([place])   . Family history of adverse reaction to anesthesia   . GERD (gastroesophageal reflux disease)   . Goiter, nontoxic, multinodular   . Gout   . H/O: hysterectomy   . History of tobacco use   . Hx laparoscopic cholecystectomy   . Hypercholesterolemia   . Hypertension   . Incomplete bladder emptying   . Insomnia   . Postmenopausal atrophic vaginitis   . RA (rheumatoid arthritis) ([place])   . Rectocele   . Renal calculus   . Sleep apnea   . Thyroid condition   . Type 2 diabetes mellitus ([place])   . Urolithiasis   . Vaginal enterocele     Family History  Problem Relation Age of Onset  . Diabetes Mother   . Heart disease Father 27  . Diabetes Son   . Crohn's disease Son     Past Surgical History:  Procedure Laterality Date  . CARPAL TUNNEL RELEASE  2018  . CATARACT EXTRACTION, BILATERAL    . CHOLECYSTECTOMY    . COLONOSCOPY     07/11/2009, 02/2015  . GALLBLADDER SURGERY    . KNEE ARTHROSCOPY Right 05/20/2020   Procedure: RIGHT KNEE ARTHROSCOPY AND DEBRIDEMENT;  Surgeon: [name], MD;  Location: [facility];  Service: Orthopedics;  Laterality: Right;  . PARTIAL HYSTERECTOMY     Overies left intact  . SQUAMOUS CELL CARCINOMA EXCISION  05/2018   Social History   Occupational History  . Not on file  Tobacco Use  . Smoking status: Former Smoker    Packs/day: 0.20    Years: 10.00    Pack years: 2.00    Types: Cigarettes    Quit date: 07/05/1978    Years since quitting: 42.4  . Smokeless tobacco: Never Used  Vaping Use  . Vaping Use: Never used  Substance and Sexual Activity  . Alcohol use: No  . Drug use: No  . Sexual activity: Not Currently

## 2020-11-26 ENCOUNTER — Ambulatory Visit: Payer: Medicare Other | Admitting: Physician Assistant

## 2020-11-26 ENCOUNTER — Other Ambulatory Visit: Payer: Self-pay

## 2020-11-26 ENCOUNTER — Other Ambulatory Visit: Payer: Self-pay | Admitting: Family Medicine

## 2020-11-26 ENCOUNTER — Encounter: Payer: Self-pay | Admitting: Physician Assistant

## 2020-11-26 VITALS — BP 161/72 | HR 92 | Resp 15 | Ht 62.0 in | Wt 166.4 lb

## 2020-11-26 DIAGNOSIS — Z8669 Personal history of other diseases of the nervous system and sense organs: Secondary | ICD-10-CM

## 2020-11-26 DIAGNOSIS — M19041 Primary osteoarthritis, right hand: Secondary | ICD-10-CM

## 2020-11-26 DIAGNOSIS — M19071 Primary osteoarthritis, right ankle and foot: Secondary | ICD-10-CM | POA: Diagnosis not present

## 2020-11-26 DIAGNOSIS — M8589 Other specified disorders of bone density and structure, multiple sites: Secondary | ICD-10-CM | POA: Diagnosis not present

## 2020-11-26 DIAGNOSIS — I10 Essential (primary) hypertension: Secondary | ICD-10-CM | POA: Diagnosis not present

## 2020-11-26 DIAGNOSIS — M19072 Primary osteoarthritis, left ankle and foot: Secondary | ICD-10-CM

## 2020-11-26 DIAGNOSIS — R252 Cramp and spasm: Secondary | ICD-10-CM

## 2020-11-26 DIAGNOSIS — Z8639 Personal history of other endocrine, nutritional and metabolic disease: Secondary | ICD-10-CM

## 2020-11-26 DIAGNOSIS — Z79899 Other long term (current) drug therapy: Secondary | ICD-10-CM

## 2020-11-26 DIAGNOSIS — E118 Type 2 diabetes mellitus with unspecified complications: Secondary | ICD-10-CM

## 2020-11-26 DIAGNOSIS — M19042 Primary osteoarthritis, left hand: Secondary | ICD-10-CM

## 2020-11-26 DIAGNOSIS — Z87898 Personal history of other specified conditions: Secondary | ICD-10-CM | POA: Diagnosis not present

## 2020-11-26 DIAGNOSIS — Z8659 Personal history of other mental and behavioral disorders: Secondary | ICD-10-CM

## 2020-11-26 DIAGNOSIS — K219 Gastro-esophageal reflux disease without esophagitis: Secondary | ICD-10-CM | POA: Diagnosis not present

## 2020-11-26 DIAGNOSIS — M7061 Trochanteric bursitis, right hip: Secondary | ICD-10-CM

## 2020-11-26 DIAGNOSIS — M0609 Rheumatoid arthritis without rheumatoid factor, multiple sites: Secondary | ICD-10-CM | POA: Diagnosis not present

## 2020-11-26 DIAGNOSIS — Z794 Long term (current) use of insulin: Secondary | ICD-10-CM

## 2020-11-26 DIAGNOSIS — Z87891 Personal history of nicotine dependence: Secondary | ICD-10-CM

## 2020-11-26 MED ORDER — FREESTYLE LIBRE 2 SENSOR MISC: 1.0000 "application " | 3 refills | Status: DC

## 2020-11-26 MED ORDER — FREESTYLE LIBRE 2 READER DEVI: 1.0000 [IU] | Freq: Once | 0 refills | Status: AC

## 2020-11-26 NOTE — Patient Instructions (Addendum)
Standing Labs We placed an order today for your standing lab work.   Please have your standing labs drawn in July and every 3 months   If possible, please have your labs drawn 2 weeks prior to your appointment so that the provider can discuss your results at your appointment.  Please note that you may see your imaging and lab results in MyChart before we have reviewed them. We may be awaiting multiple results to interpret others before contacting you. Please allow our office up to 72 hours to thoroughly review all of the results before contacting the office for clarification of your results.  We have open lab daily: Monday through Thursday from 1:30-4:30 PM and Friday from 1:30-4:00 PM at the office of Dr. Shaili Deveshwar, [facility].   Please be advised, all patients with office appointments requiring lab work will take precedent over walk-in lab work.  If possible, please come for your lab work on Monday and Friday afternoons, as you may experience shorter wait times. The office is located at [address], [address], [place], [place] [address] No appointment is necessary.   Labs are drawn by [facility]. Please bring your co-pay at the time of your lab draw.  You may receive a bill from [facility] for your lab work.  If you wish to have your labs drawn at another location, please call the office 24 hours in advance to send orders.  If you have any questions regarding directions or hours of operation,  please call [phone].   As a reminder, please drink plenty of water prior to coming for your lab work. Thanks!  

## 2020-11-26 NOTE — Addendum Note (Signed)
Addended by: Wardell Honour on: 11/26/2020 12:38 PM   Modules accepted: Orders

## 2020-11-26 NOTE — Telephone Encounter (Addendum)
Called pt for update on BP readings. She has not noticed improvement in BP readings since alternating amlodipine 5mg /7.5mg  every other day but has noted occasional swelling. Systolic BP at home has been in the 140s and up. She saw rheumatology 2 days ago and BP was 161/72, as well as NP at her PCP office yesterday and BP was 150/70; pt does report white coat HTN as well.  Advised pt to decrease amlodipine to 5mg  daily. Will start chlorthalidone 12.5mg  daily for 1 week, then increase to 25mg  daily. She'll continue on irbesartan 300mg  daily as well. Will call pt in 2 weeks for update with home BP readings, and will schedule f/u BMET at that time.

## 2020-11-27 ENCOUNTER — Other Ambulatory Visit: Payer: Self-pay

## 2020-11-27 ENCOUNTER — Encounter: Payer: Self-pay | Admitting: Orthopedic Surgery

## 2020-11-27 ENCOUNTER — Ambulatory Visit (INDEPENDENT_AMBULATORY_CARE_PROVIDER_SITE_OTHER): Payer: Medicare Other | Admitting: Orthopedic Surgery

## 2020-11-27 VITALS — BP 150/70 | HR 89 | Temp 97.5°F | Ht 62.0 in | Wt 165.0 lb

## 2020-11-27 DIAGNOSIS — E118 Type 2 diabetes mellitus with unspecified complications: Secondary | ICD-10-CM

## 2020-11-27 DIAGNOSIS — M542 Cervicalgia: Secondary | ICD-10-CM

## 2020-11-27 DIAGNOSIS — M0609 Rheumatoid arthritis without rheumatoid factor, multiple sites: Secondary | ICD-10-CM

## 2020-11-27 DIAGNOSIS — E785 Hyperlipidemia, unspecified: Secondary | ICD-10-CM

## 2020-11-27 DIAGNOSIS — R350 Frequency of micturition: Secondary | ICD-10-CM

## 2020-11-27 DIAGNOSIS — F419 Anxiety disorder, unspecified: Secondary | ICD-10-CM | POA: Diagnosis not present

## 2020-11-27 DIAGNOSIS — M25561 Pain in right knee: Secondary | ICD-10-CM

## 2020-11-27 DIAGNOSIS — E1159 Type 2 diabetes mellitus with other circulatory complications: Secondary | ICD-10-CM | POA: Diagnosis not present

## 2020-11-27 DIAGNOSIS — I700 Atherosclerosis of aorta: Secondary | ICD-10-CM

## 2020-11-27 DIAGNOSIS — Z794 Long term (current) use of insulin: Secondary | ICD-10-CM | POA: Diagnosis not present

## 2020-11-27 DIAGNOSIS — E1169 Type 2 diabetes mellitus with other specified complication: Secondary | ICD-10-CM | POA: Diagnosis not present

## 2020-11-27 DIAGNOSIS — G4709 Other insomnia: Secondary | ICD-10-CM

## 2020-11-27 DIAGNOSIS — I152 Hypertension secondary to endocrine disorders: Secondary | ICD-10-CM

## 2020-11-27 MED ORDER — LORAZEPAM 0.5 MG PO TABS: 0.5000 mg | ORAL_TABLET | Freq: Every day | ORAL | 0 refills | Status: DC

## 2020-11-27 NOTE — Patient Instructions (Signed)
Please have Dr. Estanislado Pandy office report lab work to me when completed.    Hypertension, Adult Hypertension is another name for high blood pressure. High blood pressure forces your heart to work harder to pump blood. This can cause problems over time. There are two numbers in a blood pressure reading. There is a top number (systolic) over a bottom number (diastolic). It is best to have a blood pressure that is below 120/80. Healthy choices can help lower your blood pressure, or you may need medicine to help lower it. What are the causes? The cause of this condition is not known. Some conditions may be related to high blood pressure. What increases the risk?  Smoking.  Having type 2 diabetes mellitus, high cholesterol, or both.  Not getting enough exercise or physical activity.  Being overweight.  Having too much fat, sugar, calories, or salt (sodium) in your diet.  Drinking too much alcohol.  Having long-term (chronic) kidney disease.  Having a family history of high blood pressure.  Age. Risk increases with age.  Race. You may be at higher risk if you are African American.  Gender. Men are at higher risk than women before age 25. After age 69, women are at higher risk than men.  Having obstructive sleep apnea.  Stress. What are the signs or symptoms?  High blood pressure may not cause symptoms. Very high blood pressure (hypertensive crisis) may cause: ? Headache. ? Feelings of worry or nervousness (anxiety). ? Shortness of breath. ? Nosebleed. ? A feeling of being sick to your stomach (nausea). ? Throwing up (vomiting). ? Changes in how you see. ? Very bad chest pain. ? Seizures. How is this treated?  This condition is treated by making healthy lifestyle changes, such as: ? Eating healthy foods. ? Exercising more. ? Drinking less alcohol.  Your health care provider may prescribe medicine if lifestyle changes are not enough to get your blood pressure under  control, and if: ? Your top number is above 130. ? Your bottom number is above 80.  Your personal target blood pressure may vary. Follow these instructions at home: Eating and drinking  If told, follow the DASH eating plan. To follow this plan: ? Fill one half of your plate at each meal with fruits and vegetables. ? Fill one fourth of your plate at each meal with whole grains. Whole grains include whole-wheat pasta, brown rice, and whole-grain bread. ? Eat or drink low-fat dairy products, such as skim milk or low-fat yogurt. ? Fill one fourth of your plate at each meal with low-fat (lean) proteins. Low-fat proteins include fish, chicken without skin, eggs, beans, and tofu. ? Avoid fatty meat, cured and processed meat, or chicken with skin. ? Avoid pre-made or processed food.  Eat less than 1,500 mg of salt each day.  Do not drink alcohol if: ? Your doctor tells you not to drink. ? You are pregnant, may be pregnant, or are planning to become pregnant.  If you drink alcohol: ? Limit how much you use to:  0-1 drink a day for women.  0-2 drinks a day for men. ? Be aware of how much alcohol is in your drink. In the [place] one drink equals one 12 oz bottle of beer (355 mL), one 5 oz glass of wine (148 mL), or one 1 oz glass of hard liquor (44 mL).   Lifestyle  Work with your doctor to stay at a healthy weight or to lose weight. Ask your doctor what  the best weight is for you.  Get at least 30 minutes of exercise most days of the week. This may include walking, swimming, or biking.  Get at least 30 minutes of exercise that strengthens your muscles (resistance exercise) at least 3 days a week. This may include lifting weights or doing Pilates.  Do not use any products that contain nicotine or tobacco, such as cigarettes, e-cigarettes, and chewing tobacco. If you need help quitting, ask your doctor.  Check your blood pressure at home as told by your doctor.  Keep all follow-up visits  as told by your doctor. This is important.   Medicines  Take over-the-counter and prescription medicines only as told by your doctor. Follow directions carefully.  Do not skip doses of blood pressure medicine. The medicine does not work as well if you skip doses. Skipping doses also puts you at risk for problems.  Ask your doctor about side effects or reactions to medicines that you should watch for. Contact a doctor if you:  Think you are having a reaction to the medicine you are taking.  Have headaches that keep coming back (recurring).  Feel dizzy.  Have swelling in your ankles.  Have trouble with your vision. Get help right away if you:  Get a very bad headache.  Start to feel mixed up (confused).  Feel weak or numb.  Feel faint.  Have very bad pain in your: ? Chest. ? Belly (abdomen).  Throw up more than once.  Have trouble breathing. Summary  Hypertension is another name for high blood pressure.  High blood pressure forces your heart to work harder to pump blood.  For most people, a normal blood pressure is less than 120/80.  Making healthy choices can help lower blood pressure. If your blood pressure does not get lower with healthy choices, you may need to take medicine. This information is not intended to replace advice given to you by your health care provider. Make sure you discuss any questions you have with your health care provider. Document Revised: 03/01/2018 Document Reviewed: 03/01/2018 Elsevier Patient Education  2021 [employer].

## 2020-11-27 NOTE — Progress Notes (Signed)
Careteam: Patient Care Team: [name], NP as PCP - General (Adult Health Nurse Practitioner)  Seen by: Windell Moulding, AGNP-C  PLACE OF SERVICE:  [place] Directive information    Allergies  Allergen Reactions  . Amaryl [Glimepiride]     Don't Remember  . Codeine     SICK  . Jardiance [Empagliflozin]     Urinary tract infection- SIDE EFFECT  . Pneumovax 23 [Pneumococcal Vac Polyvalent]     Red / Swelling / Hot to touch SIDE EFFECTS  . Wellbutrin [Bupropion]     Unknown  . Latex Rash    No chief complaint on file.    HPI: Patient is a 80 y.o. female seen today for medical management of chronic conditions.   05/18- she saw Dr. Sabra Heck for elevated blood pressure and leg swelling. She was advised to ask cardiology for their opinion. Ankle swelling has subsided. She ended up talking to pharmacist with [facility], she was advised to take amlodipine 5 mg QOD, and amlodipine 7.5 mg QOD. Continues to take her blood pressures twice a day. Average pressures SBP 130-140. She is planning to follow up with pharmacist about her blood pressure readings this week. Denies chest pain, sob, headaches, and dizziness.   Reports elevated blood sugars within past few weeks. Averages between 150- 270. Last night her blood sugar was 313. Reports eating pancakes with syrup, bacon, eggs and grits. She saw Dr. Kathi Ludwig yesterday for arthritis. A1c scheduled to be draem next month. Freestyle Elenor Legato- she gave vendor information to Dr. Sabra Heck so she could get meter approved. Has not heard anything from Dr. Sabra Heck. I have asked CMA to investigate.   Remains on Embrel for arthritis. No increased pain or infections.   Nocturia has increased. She is drinking more water because of the heat. She will try to limit fluids prior to bedtime.   Neck pain. Unsure if related to sleeping. Pain began about 4-5 weeks ago. Has not tired any interventions.   Decreased dose of Ativan is not helping  with anxiety and sleep. Requesting Ativan 0.5 mg Q hs prn for anxiety/insomnia.   Right knee pain. 05/24 received gel injection. Reports reduced pain.       Review of Systems:  Review of Systems  Constitutional: Negative for chills, fever, malaise/fatigue and weight loss.  HENT: Negative.   Eyes: Negative.   Respiratory: Negative for cough, shortness of breath and wheezing.   Cardiovascular: Negative for chest pain and leg swelling.  Gastrointestinal: Negative.   Genitourinary: Negative for dysuria, frequency and hematuria.       Nocturia  Musculoskeletal: Positive for joint pain, myalgias and neck pain. Negative for falls.  Skin: Negative.   Neurological: Negative for dizziness, weakness and headaches.  Psychiatric/Behavioral: Negative for depression. The patient is nervous/anxious and has insomnia.     Past Medical History:  Diagnosis Date  . Acute cystitis    UTI  . Anxiety   . Arthritis   . Benign positional vertigo   . Dermatitis   . Diabetes mellitus without complication ([place])   . Family history of adverse reaction to anesthesia   . GERD (gastroesophageal reflux disease)   . Goiter, nontoxic, multinodular   . Gout   . H/O: hysterectomy   . History of tobacco use   . Hx laparoscopic cholecystectomy   . Hypercholesterolemia   . Hypertension   . Incomplete bladder emptying   . Insomnia   . Postmenopausal atrophic vaginitis   .  RA (rheumatoid arthritis) ([place])   . Rectocele   . Renal calculus   . Sleep apnea   . Thyroid condition   . Type 2 diabetes mellitus ([place])   . Urolithiasis   . Vaginal enterocele    Past Surgical History:  Procedure Laterality Date  . CARPAL TUNNEL RELEASE  2018  . CATARACT EXTRACTION, BILATERAL    . CHOLECYSTECTOMY    . COLONOSCOPY     07/11/2009, 02/2015  . GALLBLADDER SURGERY    . KNEE ARTHROSCOPY Right 05/20/2020   Procedure: RIGHT KNEE ARTHROSCOPY AND DEBRIDEMENT;  Surgeon: [name], MD;  Location: [facility];  Service: Orthopedics;  Laterality: Right;  . PARTIAL HYSTERECTOMY     [place] left intact  . SQUAMOUS CELL CARCINOMA EXCISION  05/2018   Social History:   reports that she quit smoking about 42 years ago. Her smoking use included cigarettes. She has a 2.00 pack-year smoking history. She has never used smokeless tobacco. She reports that she does not drink alcohol and does not use drugs.  Family History  Problem Relation Age of Onset  . Diabetes Mother   . Heart disease Father 56  . Diabetes Son   . Crohn's disease Son     Medications: Patient's Medications  New Prescriptions   No medications on file  Previous Medications   ACCU-CHEK SOFTCLIX LANCETS LANCETS    Use to test blood sugar three times daily. Dx: E11.8   AMLODIPINE (NORVASC) 5 MG TABLET    Take 5mg  by mouth alternating with 7.5mg  every other day.   ASPIRIN EC 81 MG TABLET    Take 81 mg by mouth daily.   CONTINUOUS BLOOD GLUC SENSOR (FREESTYLE LIBRE 2 SENSOR) MISC    1 application by Does not apply route every 14 (fourteen) days.   CRANBERRY PO    Take by mouth daily.   DICLOFENAC SODIUM (VOLTAREN EX)    Apply 1 application topically 3 (three) times daily as needed.   ESTRADIOL (ESTRACE) 0.1 MG/GM VAGINAL CREAM    Place 1 Applicatorful vaginally 3 (three) times a week.   ETANERCEPT (ENBREL SURECLICK) 50 MG/ML INJECTION    Inject 50 mg into the skin once a week.   FLUOCINONIDE (LIDEX) 0.05 % EXTERNAL SOLUTION    Apply 1 application topically daily as needed.   GLUCOSE BLOOD (ACCU-CHEK GUIDE) TEST STRIP    Use to check blood sugar three times daily Dx: E11.8   IBUPROFEN (ADVIL) 200 MG TABLET    Take 200 mg by mouth as needed. For pain   INSULIN ASPART PROTAMINE- ASPART (NOVOLOG MIX 70/30) (70-30) 100 UNIT/ML INJECTION    Inject 36 Units into the skin in the morning and at bedtime. Increase by 2 units every 3 days until sugars are < 150   IRBESARTAN (AVAPRO) 300 MG TABLET    Take 1 tablet (300 mg total) by mouth daily.    LOPERAMIDE (IMODIUM A-D) 2 MG TABLET    Take 2 mg by mouth as needed for diarrhea or loose stools.   LORAZEPAM (ATIVAN) 0.5 MG TABLET    Take 0.5 tablets (0.25 mg total) by mouth at bedtime as needed for anxiety or sleep.   MELATONIN PO    Take 10 mg by mouth at bedtime as needed.   OMEPRAZOLE 20 MG TBDD    Take 20 mg by mouth daily.   ONDANSETRON (ZOFRAN) 4 MG TABLET    Take 4 mg by mouth 3 (three) times daily as needed  for nausea or vomiting.   PROBIOTIC PRODUCT (PROBIOTIC PO)    Take by mouth daily.   SIMVASTATIN (ZOCOR) 20 MG TABLET    Take 1 tablet (20 mg total) by mouth every evening.  Modified Medications   No medications on file  Discontinued Medications   No medications on file    Physical Exam:  There were no vitals filed for this visit. There is no height or weight on file to calculate BMI. Wt Readings from Last 3 Encounters:  11/26/20 166 lb 6.4 oz (75.5 kg)  11/19/20 162 lb 6.4 oz (73.7 kg)  09/23/20 151 lb (68.5 kg)    Physical Exam Vitals reviewed.  Constitutional:      General: She is not in acute distress. HENT:     Head: Normocephalic.  Eyes:     General:        Right eye: No discharge.        Left eye: No discharge.  Cardiovascular:     Rate and Rhythm: Normal rate and regular rhythm.     Pulses: Normal pulses.     Heart sounds: Normal heart sounds. No murmur heard.   Pulmonary:     Effort: Pulmonary effort is normal. No respiratory distress.     Breath sounds: Normal breath sounds. No wheezing.  Abdominal:     General: Bowel sounds are normal. There is no distension.     Palpations: Abdomen is soft.     Tenderness: There is no abdominal tenderness.  Musculoskeletal:     Cervical back: Normal range of motion.     Right lower leg: No edema.     Left lower leg: No edema.  Lymphadenopathy:     Cervical: No cervical adenopathy.  Skin:    General: Skin is warm and dry.     Capillary Refill: Capillary refill takes less than 2 seconds.   Neurological:     General: No focal deficit present.     Mental Status: She is alert and oriented to person, place, and time.  Psychiatric:        Mood and Affect: Mood normal.        Behavior: Behavior normal.     Labs reviewed: Basic Metabolic Panel: Recent Labs    01/22/20 0000 03/14/20 1123 06/03/20 0000 06/11/20 1036 10/08/20 1341  NA  --    < > 140 141 139  K  --    < > 4.2 4.6 5.0  CL  --    < > 102 101 102  CO2  --    < > 27 24 28   GLUCOSE  --    < > 182* 169* 233*  BUN  --    < > 20 16 23   CREATININE  --    < > 1.06* 0.97 0.97*  CALCIUM  --    < > 9.5 9.4 8.9  TSH 2.25  --  1.67  --   --    < > = values in this interval not displayed.   Liver Function Tests: Recent Labs    03/14/20 1123 06/03/20 0000 10/08/20 1341  AST 9* 12 11  ALT 9 10 12   BILITOT 1.0 1.0 1.0  PROT 6.7 7.3 6.6   No results for input(s): LIPASE, AMYLASE in the last 8760 hours. No results for input(s): AMMONIA in the last 8760 hours. CBC: Recent Labs    03/14/20 1123 06/03/20 0000 10/08/20 1341  WBC 13.1* 13.1* 11.1*  NEUTROABS 9,367* 9,406* 7,637  HGB  13.5 13.7 12.6  HCT 40.1 39.6 37.9  MCV 92.6 91.9 92.2  PLT 357 386 310   Lipid Panel: Recent Labs    01/22/20 0000  CHOL 126  HDL 44  LDLCALC 55  TRIG 164*   TSH: Recent Labs    01/22/20 0000 06/03/20 0000  TSH 2.25 1.67   A1C: Lab Results  Component Value Date   HGBA1C 8.0 (H) 06/03/2020     Assessment/Plan 1. Aortic atherosclerosis ([place]) - noted on CT chest 07/08/2020 - followed by cardiology  - cont aspirin 81 mg daily - cont simvastatin 20 mg daily  2. Other insomnia - reports ativan 0.25 mg did not help with sleep or increased anxiety at night - LORazepam (ATIVAN) 0.5 MG tablet; Take 1 tablet (0.5 mg total) by mouth at bedtime.  Dispense: 30 tablet; Refill: 0  3. Anxiety - same as above - LORazepam (ATIVAN) 0.5 MG tablet; Take 1 tablet (0.5 mg total) by mouth at bedtime.  Dispense: 30 tablet;  Refill: 0  4. Hypertension associated with diabetes ([place]) - uncontrolled - followed by cardiology - she is working with [facility] pharmacist  - goal < 130/80  5. Type 2 diabetes mellitus  - no recent hypoglycemic events - sugars averaging 150-270 - continue novolog 70/30- 36 units bid - cont asa daily - cont statin - foot exam - future - diabetic eye exam- future  6. Urinary frequency - suspect due to increased fluids in evening - recommend reducing fluid intake 2 hours prior to bedtime  7. Rheumatoid arthritis of multiple sites with negative rheumatoid factor ([facility]) - followed by Dr. Estanislado Pandy - denies increased pain - cont Embrel injections  8. Hyperlipidemia associated with type 2 diabetes mellitus ([facility]) - LDL 55 01/22/2020 - lipid panel- future - cont statin  9. Acute knee pain of right knee - followed by ortho - no recent falls - Received gel injection 05/24 - repots reduced pain since injection  10. Neck pain - suspect related to sleep position - advised to try a new pillow  Labs/tests: lab work done through Dr. Estanislado Pandy office  Total time: 36 minutes. Greater than 50 % of total time spent doing patient education and coordination of care regarding diabetes and blood pressure management.    Next appt: 04/09/2021 Windell Moulding, [facility] [phone]

## 2020-11-28 MED ORDER — CHLORTHALIDONE 25 MG PO TABS: 25.0000 mg | ORAL_TABLET | Freq: Every day | ORAL | 0 refills | Status: DC

## 2020-12-04 ENCOUNTER — Telehealth: Payer: Self-pay | Admitting: Pharmacist

## 2020-12-04 DIAGNOSIS — I152 Hypertension secondary to endocrine disorders: Secondary | ICD-10-CM

## 2020-12-04 DIAGNOSIS — E1159 Type 2 diabetes mellitus with other circulatory complications: Secondary | ICD-10-CM

## 2020-12-04 MED ORDER — IRBESARTAN 300 MG PO TABS: 300.0000 mg | ORAL_TABLET | Freq: Every day | ORAL | 3 refills | Status: DC

## 2020-12-04 MED ORDER — AMLODIPINE BESYLATE 5 MG PO TABS: 5.0000 mg | ORAL_TABLET | Freq: Every day | ORAL | 3 refills | Status: DC

## 2020-12-04 NOTE — Telephone Encounter (Signed)
Pt called clinic, states BP is still elevated (155/75 this morning, 169/74 last night, 166 systolic earlier yesterday) and she's still noting some swelling on and off in her ankles. She took amlodipine 5mg  daily starting in March 2022, confirmed she did not note any LE edema until dose was increased to 7.5mg . Will increase her chlorthalidone to the full 25mg  tablet each day. Continue amlodipine 5mg  daily and irbesartan 300mg  daily. Scheduled BMET in 1 week. Encouraged pt to elevate legs, can wear compression stockings, and limit daily sodium intake to < 2,000mg  daily. Will follow up with lab results and home BP readings next week.

## 2020-12-10 ENCOUNTER — Other Ambulatory Visit: Payer: Self-pay

## 2020-12-10 ENCOUNTER — Other Ambulatory Visit: Payer: Medicare Other | Admitting: *Deleted

## 2020-12-10 DIAGNOSIS — I152 Hypertension secondary to endocrine disorders: Secondary | ICD-10-CM

## 2020-12-10 DIAGNOSIS — E1159 Type 2 diabetes mellitus with other circulatory complications: Secondary | ICD-10-CM

## 2020-12-10 LAB — BASIC METABOLIC PANEL
BUN/Creatinine Ratio: 20 (ref 12–28)
BUN: 21 mg/dL (ref 8–27)
CO2: 24 mmol/L (ref 20–29)
Calcium: 9.6 mg/dL (ref 8.7–10.3)
Chloride: 96 mmol/L (ref 96–106)
Creatinine, Ser: 1.07 mg/dL — ABNORMAL HIGH (ref 0.57–1.00)
Glucose: 172 mg/dL — ABNORMAL HIGH (ref 65–99)
Potassium: 3.8 mmol/L (ref 3.5–5.2)
Sodium: 138 mmol/L (ref 134–144)
eGFR: 53 mL/min/{1.73_m2} — ABNORMAL LOW (ref >59)

## 2020-12-11 MED ORDER — CHLORTHALIDONE 25 MG PO TABS: 25.0000 mg | ORAL_TABLET | Freq: Every day | ORAL | 3 refills | Status: DC

## 2020-12-11 NOTE — Addendum Note (Signed)
Addended by: Maricruz Lucero E on: 12/11/2020 10:59 AM   Modules accepted: Orders

## 2020-12-11 NOTE — Telephone Encounter (Signed)
BMET stable since increasing chlorthalidone to 25mg  daily. Pt also continues on amlodipine 5mg  daily (dose limited by LE edema on 7.5mg ) and irbesartan 300mg  daily for BP control. Pt reports tolerating chlorthalidone well without adverse effects. Recent BPs at home: 124/93, 117/97 last evening, 133/67, 134/65, 127/66. Diastolic BP in clinic has consistently been at goal this year, unsure why 2 home diastolic readings were so elevated but the rest have been normal. She also reports the swelling in her legs has improved. Still has some but was advised to limit her sodium intake to < 2,000mg  daily and elevate her legs in the evening. She's aware to continue on current meds for HTN and will call with any issues.

## 2020-12-24 ENCOUNTER — Telehealth: Payer: Self-pay | Admitting: Internal Medicine

## 2020-12-24 DIAGNOSIS — I10 Essential (primary) hypertension: Secondary | ICD-10-CM

## 2020-12-24 MED ORDER — SPIRONOLACTONE 25 MG PO TABS: 25.0000 mg | ORAL_TABLET | Freq: Every day | ORAL | 3 refills | Status: DC

## 2020-12-24 NOTE — Telephone Encounter (Signed)
Patient reports BLE swelling that hurts.  Per pt it started when she began taking amlodipine.  She reports that she does not salt her food and mostly cooks at home.  She elevates her legs and it helps with swelling some.  She is concerned that swelling is related to medication.  Will route to pharm d for assistance.

## 2020-12-24 NOTE — Telephone Encounter (Signed)
Pt was started on amlodipine 5mg  daily at 09/11/20 visit with Dr Gasper Sells. At follow up visit with me, I increased her dose to 7.5mg  daily for further BP control, however this caused LE edema and dose was decreased back to 5mg  daily. She reported improvement in swelling once amlodipine dose was decreased and chlorthalidone was started for additional BP control.  Can stop amlodipine 5mg  daily to see if swelling resolves, however she will require replacement BP medication. Recommend starting spironolactone 25mg  once daily in the AM. She should continue on irbesartan 300mg  daily and chlorthalidone 25mg  daily and keep track of her BP readings over the next few weeks. BMET should be rechecked at upcoming visit with Dr Gasper Sells on 7/7. She should also focus on limiting sodium in her diet to 000mg  daily.

## 2020-12-24 NOTE — Telephone Encounter (Signed)
Pt c/o BP issue: STAT if pt c/o blurred vision, one-sided weakness or slurred speech  1. What are your last 5 BP readings?  122/71 135/70 154/73 154/69 124/79  2. Are you having any other symptoms (ex. Dizziness, headache, blurred vision, passed out)? Foot/ankle swelling   3. What is your BP issue?  Patient states her BP has been fluctuating. She scheduled an appointment for 07/07 with Dr. Gasper Sells.  Pt c/o swelling: STAT is pt has developed SOB within 24 hours  How much weight have you gained and in what time span?  No weight gain  If swelling, where is the swelling located?  Feet and ankles   Are you currently taking a fluid pill?  No   Are you currently SOB?  No   Do you have a log of your daily weights (if so, list)?  No log available   Have you gained 3 pounds in a day or 5 pounds in a week?  No weight gain  Have you traveled recently?  Patient states she went out of town last week, but she had swelling prior to traveling. She states the swelling mainly occurs when she is up on her feet for extended periods of time throughout the day.

## 2020-12-24 NOTE — Telephone Encounter (Signed)
Notified pt of pharmacist recommendation.  Pt wrote down instructions.  She also expresses that her chlorthalidone needs a refill.  I will call the pharmacy to see what happened with this medication.  Pharmacist expresses that medication is at the pharmacy waiting for her to pick up. Will call pt to inform her of this.

## 2021-01-08 ENCOUNTER — Ambulatory Visit: Payer: Medicare Other | Admitting: Internal Medicine

## 2021-01-09 ENCOUNTER — Other Ambulatory Visit: Payer: Self-pay | Admitting: Rheumatology

## 2021-01-09 ENCOUNTER — Other Ambulatory Visit: Payer: Self-pay

## 2021-01-09 DIAGNOSIS — M0609 Rheumatoid arthritis without rheumatoid factor, multiple sites: Secondary | ICD-10-CM

## 2021-01-09 DIAGNOSIS — Z79899 Other long term (current) drug therapy: Secondary | ICD-10-CM

## 2021-01-09 LAB — CBC WITH DIFFERENTIAL/PLATELET
Absolute Monocytes: 784 cells/uL (ref 200–950)
Basophils Absolute: 105 cells/uL (ref 0–200)
Basophils Relative: 0.9 %
Eosinophils Absolute: 234 cells/uL (ref 15–500)
Eosinophils Relative: 2 %
HCT: 37.6 % (ref 35.0–45.0)
Hemoglobin: 12.8 g/dL (ref 11.7–15.5)
Lymphs Abs: 2820 cells/uL (ref 850–3900)
MCH: 31.7 pg (ref 27.0–33.0)
MCHC: 34 g/dL (ref 32.0–36.0)
MCV: 93.1 fL (ref 80.0–100.0)
MPV: 10.5 fL (ref 7.5–12.5)
Monocytes Relative: 6.7 %
Neutro Abs: 7757 cells/uL (ref 1500–7800)
Neutrophils Relative %: 66.3 %
Platelets: 347 10*3/uL (ref 140–400)
RBC: 4.04 10*6/uL (ref 3.80–5.10)
RDW: 11.9 % (ref 11.0–15.0)
Total Lymphocyte: 24.1 %
WBC: 11.7 10*3/uL — ABNORMAL HIGH (ref 3.8–10.8)

## 2021-01-09 LAB — COMPLETE METABOLIC PANEL WITH GFR
AG Ratio: 1.9 (calc) (ref 1.0–2.5)
ALT: 10 U/L (ref 6–29)
AST: 14 U/L (ref 10–35)
Albumin: 4.3 g/dL (ref 3.6–5.1)
Alkaline phosphatase (APISO): 84 U/L (ref 37–153)
BUN/Creatinine Ratio: 25 (calc) — ABNORMAL HIGH (ref 6–22)
BUN: 34 mg/dL — ABNORMAL HIGH (ref 7–25)
CO2: 28 mmol/L (ref 20–32)
Calcium: 9.5 mg/dL (ref 8.6–10.4)
Chloride: 99 mmol/L (ref 98–110)
Creat: 1.36 mg/dL — ABNORMAL HIGH (ref 0.60–0.93)
GFR, Est African American: 43 mL/min/{1.73_m2} — ABNORMAL LOW (ref >=60)
GFR, Est Non African American: 37 mL/min/{1.73_m2} — ABNORMAL LOW (ref >=60)
Globulin: 2.3 g/dL (calc) (ref 1.9–3.7)
Glucose, Bld: 232 mg/dL — ABNORMAL HIGH (ref 65–99)
Potassium: 5.3 mmol/L (ref 3.5–5.3)
Sodium: 137 mmol/L (ref 135–146)
Total Bilirubin: 1 mg/dL (ref 0.2–1.2)
Total Protein: 6.6 g/dL (ref 6.1–8.1)

## 2021-01-09 MED ORDER — ENBREL SURECLICK 50 MG/ML ~~LOC~~ SOAJ: 50.0000 mg | SUBCUTANEOUS | 0 refills | Status: DC

## 2021-01-09 NOTE — Addendum Note (Signed)
Addended by: Genova Kiner E on: 01/09/2021 10:33 AM   Modules accepted: Orders

## 2021-01-09 NOTE — Telephone Encounter (Signed)
Pt canceled MD appt yesterday because she wasn't feeling well. I have called pt and rescheduled BMET for 7/13 since she recently started spironolactone.

## 2021-01-09 NOTE — Telephone Encounter (Signed)
Patient requesting refill on Enbrel to go to mail order pharmacy. Patient having lab draw today.

## 2021-01-09 NOTE — Telephone Encounter (Signed)
Next Visit: 04/29/2021  Last Visit: 11/26/2020  Last Fill: 10/08/2020  [id number] arthritis of multiple sites with negative rheumatoid factor  Current Dose per office note 11/26/2020: Enbrel 50 mg sq injections once weekly  Labs: 10/08/2020 Creatinine is borderline elevated-0.97 and GFR is slightly low but stable. Please advise the patient to avoid taking NSAIDs.  Advil is listed on hermediation list.   Glucose is elevated-233.   WBC count is borderline elevated. Rest of CBC WNL. (Patient is updating labs today.)  TB Gold: 07/14/2020   Okay to refill Enbrel?

## 2021-01-12 NOTE — Progress Notes (Signed)
Glucose is elevated-232.  Creatinine is elevated and continues to trend up.  Please clarify if the patient has been taking any NSAIDs or if she has had any other medication changes? GFR is low-37.  Please forward lab work to PCP.   WBC count remains borderline elevated. Rest of CBC WNL.  We will continue to monitor.

## 2021-01-14 ENCOUNTER — Other Ambulatory Visit: Payer: Self-pay

## 2021-01-14 ENCOUNTER — Other Ambulatory Visit: Payer: Medicare Other | Admitting: *Deleted

## 2021-01-14 DIAGNOSIS — I10 Essential (primary) hypertension: Secondary | ICD-10-CM

## 2021-01-14 LAB — BASIC METABOLIC PANEL
BUN/Creatinine Ratio: 20 (ref 12–28)
BUN: 28 mg/dL — ABNORMAL HIGH (ref 8–27)
CO2: 23 mmol/L (ref 20–29)
Calcium: 9 mg/dL (ref 8.7–10.3)
Chloride: 100 mmol/L (ref 96–106)
Creatinine, Ser: 1.37 mg/dL — ABNORMAL HIGH (ref 0.57–1.00)
Glucose: 241 mg/dL — ABNORMAL HIGH (ref 65–99)
Potassium: 4.9 mmol/L (ref 3.5–5.2)
Sodium: 136 mmol/L (ref 134–144)
eGFR: 40 mL/min/{1.73_m2} — ABNORMAL LOW (ref >59)

## 2021-01-16 ENCOUNTER — Telehealth: Payer: Self-pay | Admitting: Internal Medicine

## 2021-01-16 NOTE — Telephone Encounter (Signed)
-----   Message from Werner Lean, MD sent at 01/16/2021 10:39 AM EDT ----- Results: No change in labs  Plan: No change in meds Elevated BG  without acidosis, may benefit from better glucose control; would forward to PCP  Werner Lean, MD

## 2021-01-16 NOTE — Telephone Encounter (Signed)
Pt is returning a call about results

## 2021-01-16 NOTE — Telephone Encounter (Signed)
The patient has been notified of the result and verbalized understanding.  All questions (if any) were answered.  Pt states she will touch base with her PCP for further diabetes management. She will also closely monitor her BS at home and take her meds accordingly to support this.  Pt verbalized understanding and agrees with this plan.

## 2021-01-18 ENCOUNTER — Other Ambulatory Visit: Payer: Self-pay | Admitting: Orthopedic Surgery

## 2021-01-23 ENCOUNTER — Other Ambulatory Visit: Payer: Self-pay

## 2021-01-23 ENCOUNTER — Encounter: Payer: Self-pay | Admitting: Adult Health

## 2021-01-23 ENCOUNTER — Ambulatory Visit (INDEPENDENT_AMBULATORY_CARE_PROVIDER_SITE_OTHER): Payer: Medicare Other | Admitting: Adult Health

## 2021-01-23 VITALS — BP 160/90 | HR 94 | Temp 97.7°F | Ht 62.0 in | Wt 165.8 lb

## 2021-01-23 DIAGNOSIS — R350 Frequency of micturition: Secondary | ICD-10-CM

## 2021-01-23 DIAGNOSIS — N390 Urinary tract infection, site not specified: Secondary | ICD-10-CM

## 2021-01-23 LAB — POCT URINALYSIS DIPSTICK
Bilirubin, UA: NEGATIVE
Glucose, UA: NEGATIVE
Ketones, UA: NEGATIVE
Nitrite, UA: NEGATIVE
Protein, UA: POSITIVE — AB
Spec Grav, UA: 1.025 (ref 1.010–1.025)
Urobilinogen, UA: 0.2 E.U./dL
pH, UA: 6 (ref 5.0–8.0)

## 2021-01-23 MED ORDER — CEPHALEXIN 250 MG PO CAPS: 250.0000 mg | ORAL_CAPSULE | Freq: Four times a day (QID) | ORAL | 0 refills | Status: DC

## 2021-01-23 NOTE — Progress Notes (Signed)
Location:  [facility]   Place of Service:   clinic    CODE STATUS: full code   Allergies  Allergen Reactions  . Amaryl [Glimepiride]     Don't Remember  . Amlodipine     LE edema on 7.'5mg'$  daily. Tolerates '5mg'$  daily just fine  . Codeine     SICK  . Jardiance [Empagliflozin]     Urinary tract infection- SIDE EFFECT  . Pneumovax 23 [Pneumococcal Vac Polyvalent]     Red / Swelling / Hot to touch SIDE EFFECTS  . Wellbutrin [Bupropion]     Unknown  . Latex Rash    Chief Complaint  Patient presents with  . Acute Visit    Patient present today for possible UTI and reports frequent urination at nightly mainly since Wednesday, 01/21/21.    HPI:  She has a history of frequent UTI for which she has been seen by urology in the past. She does have some pelvic discomfort; no nausea or vomiting. There are no reports of fevers. She does have some dysuria; her urine does have a foul odor.   Past Medical History:  Diagnosis Date  . Acute cystitis    UTI  . Anxiety   . Arthritis   . Benign positional vertigo   . Dermatitis   . Diabetes mellitus without complication ([place])   . Family history of adverse reaction to anesthesia   . GERD (gastroesophageal reflux disease)   . Goiter, nontoxic, multinodular   . Gout   . H/O: hysterectomy   . History of tobacco use   . Hx laparoscopic cholecystectomy   . Hypercholesterolemia   . Hypertension   . Incomplete bladder emptying   . Insomnia   . Postmenopausal atrophic vaginitis   . RA (rheumatoid arthritis) ([place])   . Rectocele   . Renal calculus   . Sleep apnea   . Thyroid condition   . Type 2 diabetes mellitus ([place])   . Urolithiasis   . Vaginal enterocele     Past Surgical History:  Procedure Laterality Date  . CARPAL TUNNEL RELEASE  2018  . CATARACT EXTRACTION, BILATERAL    . CHOLECYSTECTOMY    . COLONOSCOPY     07/11/2009, 02/2015  . GALLBLADDER SURGERY    . KNEE ARTHROSCOPY Right 05/20/2020   Procedure: RIGHT KNEE ARTHROSCOPY AND  DEBRIDEMENT;  Surgeon: [name], MD;  Location: [facility];  Service: Orthopedics;  Laterality: Right;  . PARTIAL HYSTERECTOMY     [place] left intact  . SQUAMOUS CELL CARCINOMA EXCISION  05/2018    Social History   Socioeconomic History  . Marital status: Married    Spouse name: Not on file  . Number of children: Not on file  . Years of education: Not on file  . Highest education level: Not on file  Occupational History  . Not on file  Tobacco Use  . Smoking status: Former    Packs/day: 0.20    Years: 10.00    Pack years: 2.00    Types: Cigarettes    Quit date: 07/05/1978    Years since quitting: 42.5  . Smokeless tobacco: Never  Vaping Use  . Vaping Use: Never used  Substance and Sexual Activity  . Alcohol use: No  . Drug use: No  . Sexual activity: Not Currently  Other Topics Concern  . Not on file  Social History Narrative   Diet: Blank      Do you drink/ eat things with caffeine? Yes  Marital status:  Married                             What year were you married ? 196      Do you live in a house, apartment,assistred living, condo, trailer, etc.)? House      Is it one or more stories? 1      How many persons live in your home ? 2      Do you have any pets in your home ?(please list)  1 Sam-Dog      Highest Level of education completed: 12      Current or past profession: Office      Do you exercise?   Some                           Type & how often  Walk Daily      ADVANCED DIRECTIVES (Please bring copies)      Do you have a living will? Blank      Do you have a DNR form?  Blank                     If not, do you want to discuss one?       Do you have signed POA?HPOA forms?  Yes               If so, please bring to your appointment      FUNCTIONAL STATUS- To be completed by Spouse / child / Staff       Do you have difficulty bathing or dressing yourself ?  No      Do you have difficulty preparing food or eating ?  No       Do you have difficulty managing your mediation ?  No      Do you have difficulty managing your finances ?  No      Do you have difficulty affording your medication ?  Yes (Some)      Social Determinants of Health   Financial Resource Strain: Not on file  Food Insecurity: Not on file  Transportation Needs: Not on file  Physical Activity: Not on file  Stress: Not on file  Social Connections: Not on file  Intimate Partner Violence: Not on file   Family History  Problem Relation Age of Onset  . Diabetes Mother   . Heart disease Father 20  . Diabetes Son   . Crohn's disease Son       VITAL SIGNS BP (!) 160/90 (BP Location: Right Arm, Patient Position: Sitting, Cuff Size: Normal)   Pulse 94   Temp 97.7 F (36.5 C) (Temporal)   Ht '5\' 2"'$  (1.575 m)   Wt 165 lb 12.8 oz (75.2 kg)   SpO2 98%   BMI 30.33 kg/m   Outpatient Encounter Medications as of 01/23/2021  Medication Sig  . Accu-Chek Softclix Lancets lancets Use to test blood sugar three times daily. Dx: E11.8  . aspirin EC 81 MG tablet Take 81 mg by mouth daily.  . chlorthalidone (HYGROTON) 25 MG tablet Take 1 tablet (25 mg total) by mouth daily.  . Continuous Blood Gluc Sensor (FREESTYLE LIBRE 2 SENSOR) MISC 1 application by Does not apply route every 14 (fourteen) days.  Marland Kitchen CRANBERRY PO Take by mouth daily.  . Diclofenac Sodium (VOLTAREN EX) Apply 1 application topically 3 (three) times  daily as needed.  Marland Kitchen estradiol (ESTRACE) 0.1 MG/GM vaginal cream Place 1 Applicatorful vaginally 3 (three) times a week.  . etanercept (ENBREL SURECLICK) 50 MG/ML injection Inject 50 mg into the skin once a week.  . fluocinonide (LIDEX) 0.05 % external solution Apply 1 application topically daily as needed.  Marland Kitchen glucose blood (ACCU-CHEK GUIDE) test strip Use to check blood sugar three times daily Dx: E11.8  . insulin aspart protamine- aspart (NOVOLOG MIX 70/30) (70-30) 100 UNIT/ML injection Inject 36 Units into the skin in the morning and at  bedtime. Increase by 2 units every 3 days until sugars are < 150  . irbesartan (AVAPRO) 300 MG tablet Take 1 tablet (300 mg total) by mouth daily.  Marland Kitchen loperamide (IMODIUM A-D) 2 MG tablet Take 2 mg by mouth as needed for diarrhea or loose stools.  Marland Kitchen LORazepam (ATIVAN) 0.5 MG tablet Take 1 tablet (0.5 mg total) by mouth at bedtime.  Marland Kitchen MELATONIN PO Take 10 mg by mouth at bedtime as needed.  . Omeprazole 20 MG TBDD Take 20 mg by mouth daily.  . ondansetron (ZOFRAN) 4 MG tablet Take 4 mg by mouth 3 (three) times daily as needed for nausea or vomiting.  . Probiotic Product (PROBIOTIC PO) Take by mouth daily.  . simvastatin (ZOCOR) 20 MG tablet TAKE ONE TABLET BY MOUTH EVERY EVENING  . spironolactone (ALDACTONE) 25 MG tablet Take 1 tablet (25 mg total) by mouth daily.   No facility-administered encounter medications on file as of 01/23/2021.     SIGNIFICANT DIAGNOSTIC EXAMS  TODAY  01-23-21: urine dipstick: positive   Review of Systems  Constitutional:  Negative for malaise/fatigue.  Respiratory:  Negative for cough and shortness of breath.   Cardiovascular:  Negative for chest pain, palpitations and leg swelling.  Gastrointestinal:  Positive for abdominal pain. Negative for constipation, heartburn, nausea and vomiting.  Genitourinary:  Positive for dysuria, frequency and urgency.  Musculoskeletal:  Negative for back pain, joint pain and myalgias.  Skin: Negative.   Neurological:  Negative for dizziness.  Psychiatric/Behavioral:  The patient is not nervous/anxious.     Physical Exam Constitutional:      General: She is not in acute distress.    Appearance: She is well-developed. She is not diaphoretic.  Neck:     Thyroid: No thyromegaly.  Cardiovascular:     Rate and Rhythm: Normal rate and regular rhythm.     Pulses: Normal pulses.     Heart sounds: Normal heart sounds.  Pulmonary:     Effort: Pulmonary effort is normal. No respiratory distress.     Breath sounds: Normal breath  sounds.  Abdominal:     General: Bowel sounds are normal. There is no distension.     Palpations: Abdomen is soft.     Tenderness: There is no abdominal tenderness.  Musculoskeletal:        General: Normal range of motion.     Cervical back: Neck supple.     Right lower leg: No edema.     Left lower leg: No edema.  Lymphadenopathy:     Cervical: No cervical adenopathy.  Skin:    General: Skin is warm and dry.  Neurological:     Mental Status: She is alert and oriented to person, place, and time.  Psychiatric:        Mood and Affect: Mood normal.      ASSESSMENT/ PLAN:  TODAY  Acute lower UTI: will begin keflex 250 mg four times daily for one  week pending culture reports. She has been instructed to increase her fluid intake. She has been instructed to return to urology if she has another uti in the next 4 weeks.     Ok Edwards NP [facility]  Contact [phone] Monday through Friday 8am- 5pm  After hours call [phone]

## 2021-01-23 NOTE — Patient Instructions (Signed)
Acute lower UTI Take antibiotics as ordered Drink plenty of fluids Will call in other antibiotics if needed based upon culture report.

## 2021-01-25 LAB — URINE CULTURE
MICRO NUMBER:: 12155775
SPECIMEN QUALITY:: ADEQUATE

## 2021-01-27 ENCOUNTER — Other Ambulatory Visit: Payer: Self-pay

## 2021-01-27 DIAGNOSIS — N390 Urinary tract infection, site not specified: Secondary | ICD-10-CM

## 2021-01-27 MED ORDER — AMOXICILLIN-POT CLAVULANATE 875-125 MG PO TABS: 1.0000 | ORAL_TABLET | Freq: Two times a day (BID) | ORAL | 0 refills | Status: DC

## 2021-02-04 ENCOUNTER — Other Ambulatory Visit: Payer: Self-pay | Admitting: Orthopedic Surgery

## 2021-02-04 DIAGNOSIS — G4709 Other insomnia: Secondary | ICD-10-CM

## 2021-02-04 DIAGNOSIS — F419 Anxiety disorder, unspecified: Secondary | ICD-10-CM

## 2021-02-04 NOTE — Telephone Encounter (Signed)
Patient has request refill on medication "Lorazepam 0.'5mg'$ ". Patient last refill was 11/27/2020 with 30 tablets to be taken once daily at bedtime. Medication pend and sent to PCP [name], NP for approval. Please Advise.

## 2021-02-11 ENCOUNTER — Ambulatory Visit (INDEPENDENT_AMBULATORY_CARE_PROVIDER_SITE_OTHER): Payer: Medicare Other | Admitting: Family

## 2021-02-11 ENCOUNTER — Encounter: Payer: Self-pay | Admitting: Family

## 2021-02-11 ENCOUNTER — Other Ambulatory Visit: Payer: Self-pay

## 2021-02-11 VITALS — BP 140/60 | HR 92 | Temp 97.3°F | Resp 16 | Ht 62.0 in | Wt 165.8 lb

## 2021-02-11 DIAGNOSIS — R350 Frequency of micturition: Secondary | ICD-10-CM

## 2021-02-11 DIAGNOSIS — R252 Cramp and spasm: Secondary | ICD-10-CM

## 2021-02-11 LAB — POCT URINALYSIS DIPSTICK
Bilirubin, UA: NEGATIVE
Glucose, UA: POSITIVE — AB
Ketones, UA: POSITIVE
Nitrite, UA: POSITIVE
Protein, UA: NEGATIVE
Spec Grav, UA: 1.025
Urobilinogen, UA: NEGATIVE U/dL — AB
pH, UA: 5

## 2021-02-11 NOTE — Patient Instructions (Addendum)
- increase water intake to 6-8 glasses of daily   - Please make a appointment with Urologist for frequent urine infection   Urinary Tract Infection, Adult  A urinary tract infection (UTI) is an infection of any part of the urinary tract. The urinary tract includes the kidneys, ureters, bladder, and urethra.These organs make, store, and get rid of urine in the body. An upper UTI affects the ureters and kidneys. A lower UTI affects the bladderand urethra. What are the causes? Most urinary tract infections are caused by bacteria in your genital area around your urethra, where urine leaves your body. These bacteria grow andcause inflammation of your urinary tract. What increases the risk? You are more likely to develop this condition if: You have a urinary catheter that stays in place. You are not able to control when you urinate or have a bowel movement (incontinence). You are female and you: Use a spermicide or diaphragm for birth control. Have low estrogen levels. Are pregnant. You have certain genes that increase your risk. You are sexually active. You take antibiotic medicines. You have a condition that causes your flow of urine to slow down, such as: An enlarged prostate, if you are female. Blockage in your urethra. A kidney stone. A nerve condition that affects your bladder control (neurogenic bladder). Not getting enough to drink, or not urinating often. You have certain medical conditions, such as: Diabetes. A weak disease-fighting system (immunesystem). Sickle cell disease. Gout. Spinal cord injury. What are the signs or symptoms? Symptoms of this condition include: Needing to urinate right away (urgency). Frequent urination. This may include small amounts of urine each time you urinate. Pain or burning with urination. Blood in the urine. Urine that smells bad or unusual. Trouble urinating. Cloudy urine. Vaginal discharge, if you are female. Pain in the abdomen or the  lower back. You may also have: Vomiting or a decreased appetite. Confusion. Irritability or tiredness. A fever or chills. Diarrhea. The first symptom in older adults may be confusion. In some cases, they may nothave any symptoms until the infection has worsened. How is this diagnosed? This condition is diagnosed based on your medical history and a physical exam. You may also have other tests, including: Urine tests. Blood tests. Tests for STIs (sexually transmitted infections). If you have had more than one UTI, a cystoscopy or imaging studies may be doneto determine the cause of the infections. How is this treated? Treatment for this condition includes: Antibiotic medicine. Over-the-counter medicines to treat discomfort. Drinking enough water to stay hydrated. If you have frequent infections or have other conditions such as a kidney stone, you may need to see a health care provider who specializes in the urinary tract (urologist). In rare cases, urinary tract infections can cause sepsis. Sepsis is a life-threatening condition that occurs when the body responds to an infection. Sepsis is treated in the hospital with IV antibiotics, fluids, and othermedicines. Follow these instructions at home:  Medicines Take over-the-counter and prescription medicines only as told by your health care provider. If you were prescribed an antibiotic medicine, take it as told by your health care provider. Do not stop using the antibiotic even if you start to feel better. General instructions Make sure you: Empty your bladder often and completely. Do not hold urine for long periods of time. Empty your bladder after sex. Wipe from front to back after urinating or having a bowel movement if you are female. Use each tissue only one time when you wipe. Drink  enough fluid to keep your urine pale yellow. Keep all follow-up visits. This is important. Contact a health care provider if: Your symptoms do not get  better after 1-2 days. Your symptoms go away and then return. Get help right away if: You have severe pain in your back or your lower abdomen. You have a fever or chills. You have nausea or vomiting. Summary A urinary tract infection (UTI) is an infection of any part of the urinary tract, which includes the kidneys, ureters, bladder, and urethra. Most urinary tract infections are caused by bacteria in your genital area. Treatment for this condition often includes antibiotic medicines. If you were prescribed an antibiotic medicine, take it as told by your health care provider. Do not stop using the antibiotic even if you start to feel better. Keep all follow-up visits. This is important. This information is not intended to replace advice given to you by your health care provider. Make sure you discuss any questions you have with your healthcare provider. Document Revised: 02/01/2020 Document Reviewed: 02/01/2020 Elsevier Patient Education  [place].

## 2021-02-11 NOTE — Progress Notes (Signed)
Provider: Sonita Michiels FNP-C  [name], NP  Patient Care Team: [name], NP as PCP - General (Adult Health Nurse Practitioner)  Extended Emergency Contact Information Primary Emergency Contact: Maiorino,Donnie Address: [address]          [place],  [address] Johnnette Litter of [place] Phone: [phone] Mobile Phone: [phone] Relation: Spouse Secondary Emergency Contact: [place], [place] Phone: [phone] Mobile Phone: [phone] Relation: Other  Code Status:  Full Code  Goals of care: Advanced Directive information Advanced Directives 02/11/2021  Does Patient Have a Medical Advance Directive? No  Type of Advance Directive -  Does patient want to make changes to medical advance directive? -  Copy of [place] in Chart? -  Would patient like information on creating a medical advance directive? No - Patient declined     Chief Complaint  Patient presents with   Acute Visit    Patient complains of urinary frequency.     HPI:  Pt is a 80 y.o. female seen today for an acute visit for evaluation of urinary frequency x 3 days.Also states has some lower abdominal discomfort over the bladder.she denies any fever,chills,nausea,vomiting,flank pain,urgency,dysuria,difficult urination or hematuria.   She does have a history of recurrent UTI treat last 01/23/2021 for E.Coli by Morene Crocker was treated with Keflex which was discontinued after the culture returned.she was treated with another antibiotic for 10 days.she was advised to see Urologist if she has another UTI within 4 weeks.She has seen a Dealer in the past.No records for review.could not recall urologist but has the office Number.Patient does not have a history of pyelonephritis.  Appetite is good. Drinks 2-3 bottles of 12 oz water.  Also complains of calf muscle cramps worst at night but occurs also when she sits for a prolong period of time.she denies nay swelling or  redness of calf muscle.     Past Medical History:  Diagnosis Date   Acute cystitis    UTI   Anxiety    Arthritis    Benign positional vertigo    Dermatitis    Diabetes mellitus without complication ([place])    Family history of adverse reaction to anesthesia    GERD (gastroesophageal reflux disease)    Goiter, nontoxic, multinodular    Gout    H/O: hysterectomy    History of tobacco use    Hx laparoscopic cholecystectomy    Hypercholesterolemia    Hypertension    Incomplete bladder emptying    Insomnia    Postmenopausal atrophic vaginitis    RA (rheumatoid arthritis) ([place])    Rectocele    Renal calculus    Sleep apnea    Thyroid condition    Type 2 diabetes mellitus ([place])    Urolithiasis    Vaginal enterocele    Past Surgical History:  Procedure Laterality Date   CARPAL TUNNEL RELEASE  2018   CATARACT EXTRACTION, BILATERAL     CHOLECYSTECTOMY     COLONOSCOPY     07/11/2009, 02/2015   GALLBLADDER SURGERY     KNEE ARTHROSCOPY Right 05/20/2020   Procedure: RIGHT KNEE ARTHROSCOPY AND DEBRIDEMENT;  Surgeon: [name], MD;  Location: [facility];  Service: Orthopedics;  Laterality: Right;   PARTIAL HYSTERECTOMY     Overies left intact   SQUAMOUS CELL CARCINOMA EXCISION  05/2018    Allergies  Allergen Reactions   Amaryl [Glimepiride]     Don't Remember   Amlodipine     LE  edema on 7.65m daily. Tolerates 547mdaily just fine   Codeine     SICK   Jardiance [Empagliflozin]     Urinary tract infection- SIDE EFFECT   Pneumovax 23 [Pneumococcal Vac Polyvalent]     Red / Swelling / Hot to touch SIDE EFFECTS   Wellbutrin [Bupropion]     Unknown   Latex Rash    Outpatient Encounter Medications as of 02/11/2021  Medication Sig   Accu-Chek Softclix Lancets lancets Use to test blood sugar three times daily. Dx: E11.8   amoxicillin-clavulanate (AUGMENTIN) 875-125 MG tablet Take 1 tablet by mouth 2 (two) times daily.   aspirin EC 81 MG tablet Take 81 mg by  mouth daily.   chlorthalidone (HYGROTON) 25 MG tablet Take 1 tablet (25 mg total) by mouth daily.   Continuous Blood Gluc Sensor (FREESTYLE LIBRE 2 SENSOR) MISC 1 application by Does not apply route every 14 (fourteen) days.   CRANBERRY PO Take by mouth daily.   Diclofenac Sodium (VOLTAREN EX) Apply 1 application topically 3 (three) times daily as needed.   estradiol (ESTRACE) 0.1 MG/GM vaginal cream Place 1 Applicatorful vaginally 3 (three) times a week.   etanercept (ENBREL SURECLICK) 50 MG/ML injection Inject 50 mg into the skin once a week.   fluocinonide (LIDEX) 0.05 % external solution Apply 1 application topically daily as needed.   glucose blood (ACCU-CHEK GUIDE) test strip Use to check blood sugar three times daily Dx: E11.8   insulin aspart protamine- aspart (NOVOLOG MIX 70/30) (70-30) 100 UNIT/ML injection Inject 36 Units into the skin in the morning and at bedtime. Increase by 2 units every 3 days until sugars are < 150   irbesartan (AVAPRO) 300 MG tablet Take 1 tablet (300 mg total) by mouth daily.   loperamide (IMODIUM A-D) 2 MG tablet Take 2 mg by mouth as needed for diarrhea or loose stools.   LORazepam (ATIVAN) 0.5 MG tablet TAKE ONE TABLET BY MOUTH EVERY NIGHT AT BEDTIME   MELATONIN PO Take 10 mg by mouth at bedtime as needed.   Omeprazole 20 MG TBDD Take 20 mg by mouth daily.   ondansetron (ZOFRAN) 4 MG tablet Take 4 mg by mouth 3 (three) times daily as needed for nausea or vomiting.   Probiotic Product (PROBIOTIC PO) Take by mouth daily.   simvastatin (ZOCOR) 20 MG tablet TAKE ONE TABLET BY MOUTH EVERY EVENING   spironolactone (ALDACTONE) 25 MG tablet Take 1 tablet (25 mg total) by mouth daily.   No facility-administered encounter medications on file as of 02/11/2021.    Review of Systems  Constitutional:  Negative for appetite change, chills, fatigue, fever and unexpected weight change.  Respiratory:  Negative for cough, chest tightness, shortness of breath and wheezing.    Cardiovascular:  Negative for chest pain, palpitations and leg swelling.  Gastrointestinal:  Negative for abdominal distention, abdominal pain, blood in stool, constipation, diarrhea, nausea and vomiting.       Lower abdominal discomfort over bladder   Endocrine: Negative for polydipsia, polyphagia and polyuria.  Genitourinary:  Positive for frequency. Negative for difficulty urinating, dysuria, flank pain and urgency.  Musculoskeletal:  Negative for arthralgias, back pain, gait problem, joint swelling and myalgias.       Muscle cramps   Neurological:  Negative for dizziness, speech difficulty, weakness, light-headedness, numbness and headaches.  Hematological:  Does not bruise/bleed easily.  Psychiatric/Behavioral:  Negative for agitation, behavioral problems, confusion, hallucinations and sleep disturbance. The patient is not nervous/anxious.    Immunization  History  Administered Date(s) Administered   Influenza Split 03/28/2008, 03/05/2010, 04/17/2012, 04/20/2013   Influenza, High Dose Seasonal PF 02/08/2020   Influenza-Unspecified 05/11/2011, 04/22/2014, 04/15/2015, 02/20/2016, 03/02/2017, 03/09/2018, 03/20/2019, 03/21/2020   PFIZER(Purple Top)SARS-COV-2 Vaccination 07/24/2019, 08/06/2019, 08/27/2019, 04/21/2020   Pneumococcal Conjugate-13 05/02/2014   Pneumococcal Polysaccharide-23 04/23/2008   Tdap 05/01/2013   Zoster, Live 04/17/2012   Pertinent  Health Maintenance Due  Topic Date Due   HEMOGLOBIN A1C  12/01/2020   INFLUENZA VACCINE  02/02/2021   FOOT EXAM  06/03/2021   OPHTHALMOLOGY EXAM  06/04/2021   DEXA SCAN  Completed   PNA vac Low Risk Adult  Completed   Fall Risk  02/11/2021 01/23/2021 06/24/2020  Falls in the past year? 0 0 0  Number falls in past yr: 0 0 0  Injury with Fall? 0 0 0  Risk for fall due to : No Fall Risks No Fall Risks -  Follow up Falls evaluation completed - -   Functional Status Survey:    There were no vitals filed for this visit. There is no  height or weight on file to calculate BMI. Physical Exam Vitals reviewed.  Constitutional:      General: She is not in acute distress.    Appearance: Normal appearance. She is normal weight. She is not ill-appearing or diaphoretic.  HENT:     Head: Normocephalic.     Right Ear: Tympanic membrane, ear canal and external ear normal. There is no impacted cerumen.     Left Ear: Tympanic membrane, ear canal and external ear normal. There is no impacted cerumen.     Nose: Nose normal. No congestion or rhinorrhea.     Mouth/Throat:     Mouth: Mucous membranes are moist.     Pharynx: Oropharynx is clear. No oropharyngeal exudate or posterior oropharyngeal erythema.  Eyes:     General: No scleral icterus.       Right eye: No discharge.        Left eye: No discharge.     Extraocular Movements: Extraocular movements intact.     Conjunctiva/sclera: Conjunctivae normal.     Pupils: Pupils are equal, round, and reactive to light.  Neck:     Vascular: No carotid bruit.  Cardiovascular:     Rate and Rhythm: Normal rate and regular rhythm.     Pulses: Normal pulses.     Heart sounds: Normal heart sounds. No murmur heard.   No friction rub. No gallop.  Pulmonary:     Effort: Pulmonary effort is normal. No respiratory distress.     Breath sounds: Normal breath sounds. No wheezing, rhonchi or rales.  Chest:     Chest wall: No tenderness.  Abdominal:     General: Bowel sounds are normal. There is no distension.     Palpations: Abdomen is soft. There is no mass.     Tenderness: There is no abdominal tenderness. There is no right CVA tenderness, left CVA tenderness, guarding or rebound.  Musculoskeletal:        General: No swelling or tenderness. Normal range of motion.     Cervical back: Normal range of motion. No rigidity or tenderness.     Right lower leg: No edema.     Left lower leg: No edema.  Lymphadenopathy:     Cervical: No cervical adenopathy.  Skin:    General: Skin is warm and dry.      Coloration: Skin is not pale.     Findings: No bruising, erythema, lesion  or rash.  Neurological:     Mental Status: She is alert and oriented to person, place, and time.     Cranial Nerves: No cranial nerve deficit.     Sensory: No sensory deficit.     Motor: No weakness.     Coordination: Coordination normal.     Gait: Gait normal.  Psychiatric:        Mood and Affect: Mood normal.        Speech: Speech normal.        Behavior: Behavior normal.        Thought Content: Thought content normal.        Judgment: Judgment normal.    Labs reviewed: Recent Labs    12/10/20 1141 01/09/21 1337 01/14/21 1247  NA 138 137 136  K 3.8 5.3 4.9  CL 96 99 100  CO2 _0 GLUCOSE 172* 232* 241*  BUN 21 34* 28*  CREATININE 1.07* 1.36* 1.37*  CALCIUM 9.6 9.5 9.0   Recent Labs    06/03/20 0000 10/08/20 1341 01/09/21 1337  AST _1 ALT _2 BILITOT 1.0 1.0 1.0  PROT 7.3 6.6 6.6   Recent Labs    06/03/20 0000 10/08/20 1341 01/09/21 1337  WBC 13.1* 11.1* 11.7*  NEUTROABS 9,406* 7,637 7,757  HGB 13.7 12.6 12.8  HCT 39.6 37.9 37.6  MCV 91.9 92.2 93.1  PLT 386 310 347   Lab Results  Component Value Date   TSH 1.67 06/03/2020   Lab Results  Component Value Date   HGBA1C 8.0 (H) 06/03/2020   Lab Results  Component Value Date   CHOL 126 01/22/2020   HDL 44 01/22/2020   LDLCALC 55 01/22/2020   TRIG 164 (A) 01/22/2020    Significant Diagnostic Results in last 30 days:  No results found.  Assessment/Plan  1. Urinary frequency Afebrile.Negative exam findings. - POC Urinalysis Dipstick shows yellow very cloudy urine,positive for glucose, ketones ,large blood, Nitrites and Large Leukocytes 3 + urine send for culture. Results discussed with patient would like to wait until final urine culture returns then will call with results. - Advised to increase water intake to 6-8 glasses daily  - dietary modification and exercise advised to keep blood sugars under  control glucose noted in the urine which could be contributing to her frequent urinary tract infection.  - Urine Culture  2. Muscle cramps Worst at night and with prolong sitting. - stretching exercise advised  - Avoid prolong sitting Will obtain lab work as below to rule out other metabolic etiologies. - Magnesium - CMP with eGFR([facility])  Family/ staff Communication: Reviewed plan of care with patient verbalize understanding   Labs/tests ordered:  - POC Urinalysis Dipstick - Urine Culture  - Magnesium - CMP with eGFR([facility])  Next Appointment: As needed if symptoms worsen or fail to improve    Sandrea Hughs, NP

## 2021-02-12 LAB — COMPLETE METABOLIC PANEL WITH GFR
AG Ratio: 1.8 (calc) (ref 1.0–2.5)
ALT: 11 U/L (ref 6–29)
AST: 12 U/L (ref 10–35)
Albumin: 4.6 g/dL (ref 3.6–5.1)
Alkaline phosphatase (APISO): 96 U/L (ref 37–153)
BUN/Creatinine Ratio: 24 (calc) — ABNORMAL HIGH (ref 6–22)
BUN: 28 mg/dL — ABNORMAL HIGH (ref 7–25)
CO2: 24 mmol/L (ref 20–32)
Calcium: 9.4 mg/dL (ref 8.6–10.4)
Chloride: 99 mmol/L (ref 98–110)
Creat: 1.17 mg/dL — ABNORMAL HIGH (ref 0.60–1.00)
Globulin: 2.6 g/dL (calc) (ref 1.9–3.7)
Glucose, Bld: 291 mg/dL — ABNORMAL HIGH (ref 65–139)
Potassium: 4.4 mmol/L (ref 3.5–5.3)
Sodium: 133 mmol/L — ABNORMAL LOW (ref 135–146)
Total Bilirubin: 0.9 mg/dL (ref 0.2–1.2)
Total Protein: 7.2 g/dL (ref 6.1–8.1)
eGFR: 48 mL/min/{1.73_m2} — ABNORMAL LOW (ref >=60)

## 2021-02-12 LAB — MAGNESIUM: Magnesium: 1.6 mg/dL (ref 1.5–2.5)

## 2021-02-13 LAB — URINE CULTURE
MICRO NUMBER:: 12226128
SPECIMEN QUALITY:: ADEQUATE

## 2021-02-17 ENCOUNTER — Telehealth: Payer: Self-pay

## 2021-02-17 MED ORDER — SACCHAROMYCES BOULARDII 250 MG PO CAPS: 250.0000 mg | ORAL_CAPSULE | Freq: Two times a day (BID) | ORAL | 0 refills | Status: DC

## 2021-02-17 MED ORDER — CIPROFLOXACIN HCL 500 MG PO TABS: 500.0000 mg | ORAL_TABLET | Freq: Two times a day (BID) | ORAL | 0 refills | Status: AC

## 2021-02-17 NOTE — Telephone Encounter (Signed)
Patient's husband Letitia Libra) called requesting patients urine culture results. I didn't see the results in patients chart and advised patient and husband that a message will be send to [facility]. Please advise.

## 2021-02-17 NOTE — Telephone Encounter (Signed)
The printed results says identification and susceptibility to follow. This will show which antibiotics the bacteria is sensitive to.

## 2021-02-17 NOTE — Telephone Encounter (Signed)
Patient was advised and medication was send to pharmacy per Marlowe Sax, NP

## 2021-02-17 NOTE — Telephone Encounter (Signed)
Will call in antibiotics but if culture & sensitivity returns and recommends another antibiotic will need to discontinue Cipro and call in a different antibiotics.  Start on Cipro 500 mg tablet one by mouth twice daily x 7 days Take along with probiotics Florastor 250 mg capsule one by mouth twice daily x 10 days to prevent antibiotics associated diarrhea

## 2021-02-17 NOTE — Telephone Encounter (Signed)
Still waiting for patient's final urine culture results.

## 2021-02-17 NOTE — Telephone Encounter (Signed)
Patient was advised and states that she is really needing something to help with her symptoms. She states that she never waited this long for a urine culture results or treatment and want to know if any medication could be send into the pharmacy. Please advise.

## 2021-02-19 DIAGNOSIS — M1711 Unilateral primary osteoarthritis, right knee: Secondary | ICD-10-CM | POA: Diagnosis not present

## 2021-02-27 ENCOUNTER — Telehealth: Payer: Self-pay | Admitting: *Deleted

## 2021-02-27 MED ORDER — FLUCONAZOLE 150 MG PO TABS: 150.0000 mg | ORAL_TABLET | Freq: Once | ORAL | 0 refills | Status: AC

## 2021-02-27 NOTE — Telephone Encounter (Signed)
Patient called and stated that she saw you on 8/10 for UTI. Was given Antibiotics and had completed them. Take Probiotic daily and eating yogurt.   Having Itching in her private area and uncomfortable. Stated that she has a yeast infection and needs something called in for it.   Please Advise.

## 2021-02-27 NOTE — Telephone Encounter (Signed)
Pended and sent to [facility] for approval due to [place].

## 2021-02-27 NOTE — Telephone Encounter (Signed)
Take diflucan 150 mg tablet one by mouth x 1 dose then repeat x 1 dose in one week for yeast infection

## 2021-03-05 ENCOUNTER — Other Ambulatory Visit: Payer: Self-pay

## 2021-03-05 ENCOUNTER — Ambulatory Visit (INDEPENDENT_AMBULATORY_CARE_PROVIDER_SITE_OTHER): Payer: Medicare Other | Admitting: Orthopedic Surgery

## 2021-03-05 ENCOUNTER — Encounter: Payer: Self-pay | Admitting: Orthopedic Surgery

## 2021-03-05 DIAGNOSIS — Z794 Long term (current) use of insulin: Secondary | ICD-10-CM

## 2021-03-05 DIAGNOSIS — E118 Type 2 diabetes mellitus with unspecified complications: Secondary | ICD-10-CM

## 2021-03-05 NOTE — Progress Notes (Signed)
Careteam: Patient Care Team: [name], NP as PCP - General (Adult Health Nurse Practitioner)  Seen by: Windell Moulding, AGNP-C  PLACE OF SERVICE:  [place] Directive information Does Patient Have a Medical Advance Directive?: No, Would patient like information on creating a medical advance directive?: No - Patient declined  Allergies  Allergen Reactions   Amaryl [Glimepiride]     Don't Remember   Amlodipine     LE edema on 7.'5mg'$  daily. Tolerates '5mg'$  daily just fine   Codeine     SICK   Jardiance [Empagliflozin]     Urinary tract infection- SIDE EFFECT   Pneumovax 23 [Pneumococcal Vac Polyvalent]     Red / Swelling / Hot to touch SIDE EFFECTS   Wellbutrin [Bupropion]     Unknown   Latex Rash    Chief Complaint  Patient presents with   Acute Visit    Complains of blood sugar being too high in the 350's     HPI: Patient is a 80 y.o. female presents today via telephone to discuss recent elevated blood sugars.   08/10 she developed urinary frequency and presented to office to have UA and culture done. Culture results klebsiella pneumoniae and she was given Cipro 500 mg bid x 7 days. She has a history of frequent UTI in 2021. It was determined by urology, that her diabetes was the cause of her frequent UTI. Today, she reports elevated blood sugars in the past week and believes this is related to her most recent UTI. Morning blood sugars 140's and evening 180's. She is taking 36 unit of 70/30 insulin at this time. Reports following diabetic diet. She denies hypoglycemic events. Treatment options discussed. Requesting referral to endocrinologist in an effort to improve glycemic control.    Review of Systems:  Review of Systems  Constitutional:  Negative for chills, fever, malaise/fatigue and weight loss.  Respiratory:  Negative for cough, shortness of breath and wheezing.   Cardiovascular:  Negative for chest pain.  Genitourinary:  Positive for frequency. Negative  for dysuria.  Endo/Heme/Allergies:  Negative for polydipsia.  Psychiatric/Behavioral:  Negative for depression. The patient is not nervous/anxious.    Past Medical History:  Diagnosis Date   Acute cystitis    UTI   Anxiety    Arthritis    Benign positional vertigo    Dermatitis    Diabetes mellitus without complication ([place])    Family history of adverse reaction to anesthesia    GERD (gastroesophageal reflux disease)    Goiter, nontoxic, multinodular    Gout    H/O: hysterectomy    History of tobacco use    Hx laparoscopic cholecystectomy    Hypercholesterolemia    Hypertension    Incomplete bladder emptying    Insomnia    Postmenopausal atrophic vaginitis    RA (rheumatoid arthritis) ([place])    Rectocele    Renal calculus    Sleep apnea    Thyroid condition    Type 2 diabetes mellitus ([place])    Urolithiasis    Vaginal enterocele    Past Surgical History:  Procedure Laterality Date   CARPAL TUNNEL RELEASE  2018   CATARACT EXTRACTION, BILATERAL     CHOLECYSTECTOMY     COLONOSCOPY     07/11/2009, 02/2015   GALLBLADDER SURGERY     KNEE ARTHROSCOPY Right 05/20/2020   Procedure: RIGHT KNEE ARTHROSCOPY AND DEBRIDEMENT;  Surgeon: [name], MD;  Location: [facility];  Service: Orthopedics;  Laterality: Right;   PARTIAL HYSTERECTOMY     Overies left intact   SQUAMOUS CELL CARCINOMA EXCISION  05/2018   Social History:   reports that she quit smoking about 42 years ago. Her smoking use included cigarettes. She has a 2.00 pack-year smoking history. She has never used smokeless tobacco. She reports that she does not drink alcohol and does not use drugs.  Family History  Problem Relation Age of Onset   Diabetes Mother    Heart disease Father 27   Diabetes Son    Crohn's disease Son     Medications: Patient's Medications  New Prescriptions   No medications on file  Previous Medications   ACCU-CHEK SOFTCLIX LANCETS LANCETS    Use to test blood sugar  three times daily. Dx: E11.8   ASPIRIN EC 81 MG TABLET    Take 81 mg by mouth daily.   CHLORTHALIDONE (HYGROTON) 25 MG TABLET    Take 1 tablet (25 mg total) by mouth daily.   CONTINUOUS BLOOD GLUC SENSOR (FREESTYLE LIBRE 2 SENSOR) MISC    1 application by Does not apply route every 14 (fourteen) days.   CRANBERRY PO    Take by mouth daily.   DICLOFENAC SODIUM (VOLTAREN EX)    Apply 1 application topically 3 (three) times daily as needed.   ESTRADIOL (ESTRACE) 0.1 MG/GM VAGINAL CREAM    Place 1 Applicatorful vaginally 3 (three) times a week.   ETANERCEPT (ENBREL SURECLICK) 50 MG/ML INJECTION    Inject 50 mg into the skin once a week.   FLUOCINONIDE (LIDEX) 0.05 % EXTERNAL SOLUTION    Apply 1 application topically daily as needed.   GLUCOSE BLOOD (ACCU-CHEK GUIDE) TEST STRIP    Use to check blood sugar three times daily Dx: E11.8   INSULIN ASPART PROTAMINE- ASPART (NOVOLOG MIX 70/30) (70-30) 100 UNIT/ML INJECTION    Inject 36 Units into the skin in the morning and at bedtime. Increase by 2 units every 3 days until sugars are < 150   IRBESARTAN (AVAPRO) 300 MG TABLET    Take 1 tablet (300 mg total) by mouth daily.   LOPERAMIDE (IMODIUM A-D) 2 MG TABLET    Take 2 mg by mouth as needed for diarrhea or loose stools.   LORAZEPAM (ATIVAN) 0.5 MG TABLET    TAKE ONE TABLET BY MOUTH EVERY NIGHT AT BEDTIME   MELATONIN PO    Take 10 mg by mouth at bedtime as needed.   OMEPRAZOLE 20 MG TBDD    Take 20 mg by mouth daily.   ONDANSETRON (ZOFRAN) 4 MG TABLET    Take 4 mg by mouth 3 (three) times daily as needed for nausea or vomiting.   PROBIOTIC PRODUCT (PROBIOTIC PO)    Take by mouth daily.   SIMVASTATIN (ZOCOR) 20 MG TABLET    TAKE ONE TABLET BY MOUTH EVERY EVENING   SPIRONOLACTONE (ALDACTONE) 25 MG TABLET    Take 1 tablet (25 mg total) by mouth daily.  Modified Medications   No medications on file  Discontinued Medications   SACCHAROMYCES BOULARDII (FLORASTOR) 250 MG CAPSULE    Take 1 capsule (250 mg total)  by mouth 2 (two) times daily.    Physical Exam:  There were no vitals filed for this visit. There is no height or weight on file to calculate BMI. Wt Readings from Last 3 Encounters:  02/11/21 165 lb 12.8 oz (75.2 kg)  01/23/21 165 lb 12.8 oz (75.2 kg)  11/27/20 165 lb (74.8 kg)  Physical Exam: unable to complete due to telephone encounter.   Labs reviewed: Basic Metabolic Panel: Recent Labs    06/03/20 0000 06/11/20 1036 01/09/21 1337 01/14/21 1247 02/11/21 1418  NA 140   < > 137 136 133*  K 4.2   < > 5.3 4.9 4.4  CL 102   < > 99 100 99  CO2 27   < > '28 23 24  '$ GLUCOSE 182*   < > 232* 241* 291*  BUN 20   < > 34* 28* 28*  CREATININE 1.06*   < > 1.36* 1.37* 1.17*  CALCIUM 9.5   < > 9.5 9.0 9.4  MG  --   --   --   --  1.6  TSH 1.67  --   --   --   --    < > = values in this interval not displayed.   Liver Function Tests: Recent Labs    10/08/20 1341 01/09/21 1337 02/11/21 1418  AST '11 14 12  '$ ALT '12 10 11  '$ BILITOT 1.0 1.0 0.9  PROT 6.6 6.6 7.2   No results for input(s): LIPASE, AMYLASE in the last 8760 hours. No results for input(s): AMMONIA in the last 8760 hours. CBC: Recent Labs    06/03/20 0000 10/08/20 1341 01/09/21 1337  WBC 13.1* 11.1* 11.7*  NEUTROABS 9,406* 7,637 7,757  HGB 13.7 12.6 12.8  HCT 39.6 37.9 37.6  MCV 91.9 92.2 93.1  PLT 386 310 347   Lipid Panel: No results for input(s): CHOL, HDL, LDLCALC, TRIG, CHOLHDL, LDLDIRECT in the last 8760 hours. TSH: Recent Labs    06/03/20 0000  TSH 1.67   A1C: Lab Results  Component Value Date   HGBA1C 8.0 (H) 06/03/2020     Assessment/Plan 1. Type 2 diabetes mellitus with complication, with long-term current use of insulin ([place]) - history of frequent UTI in 2021, urology determined it was related to poor glycemic control - reports sugars 140-180's on 36 units 70/30 bid - recommend increasing 70/30 by 2 units every 3 days until blood glucose < 150 - recommend diet low in carbs and  sugars - Ambulatory referral to Endocrinology   Telephone Note   I connected with Candise Bowens by telephone and verified that I am speaking with the correct person using two identifiers.   Location: [facility] Patient: Patton Werber Provider: Windell Moulding, AGNP-C    I discussed the limitations, risks, security and privacy concerns of performing an evaluation and management service by telephone and the availability of in person appointments. I also discussed with the patient that there may be a patient responsible charge related to this service. The patient expressed understanding and agreed to proceed.    I discussed the assessment and treatment plan with the patient. The patient was provided an opportunity to ask questions and all were answered. The patient agreed with the plan and demonstrated an understanding of the instructions.     The patient was advised to call back or seek an in-person evaluation if the symptoms worsen or if the condition fails to improve as anticipated.   I provided 15 minutes of non-face-to-face time during this encounter.   Windell Moulding, AGNP-C Avs printed and mailed     Next appt: 04/09/2021  Niel Hummer  [facility] [phone]

## 2021-03-05 NOTE — Progress Notes (Signed)
  This service is provided via telemedicine  No vital signs collected/recorded due to the encounter was a telemedicine visit.   Location of patient (ex: home, work):  Home  Patient consents to a telephone visit:  Yes  Location of the provider (ex: office, home):  [employer]  Name of any referring provider:  [name], NP   Names of all persons participating in the telemedicine service and their role in the encounter:  Patient, Heriberto [place]ig[place], [place], Windell Moulding, NP.    Time spent on call:  8 minutes spent on the phone with Medical Assistant.

## 2021-03-12 ENCOUNTER — Encounter: Payer: Medicare Other | Admitting: Nurse Practitioner

## 2021-03-12 ENCOUNTER — Other Ambulatory Visit: Payer: Self-pay

## 2021-03-12 NOTE — Progress Notes (Signed)
This encounter was created in error - please disregard.

## 2021-03-17 ENCOUNTER — Other Ambulatory Visit: Payer: Self-pay | Admitting: Orthopedic Surgery

## 2021-03-17 DIAGNOSIS — G4709 Other insomnia: Secondary | ICD-10-CM

## 2021-03-17 DIAGNOSIS — F419 Anxiety disorder, unspecified: Secondary | ICD-10-CM

## 2021-03-17 NOTE — Telephone Encounter (Signed)
Patient has request refill on medication "Lorazepam 0.'5mg'$ ". Patient medication last filled on 02/04/2021. Patient medication pend and sent to PCP [name], NP for approval. Please Advise.

## 2021-03-30 ENCOUNTER — Other Ambulatory Visit: Payer: Self-pay | Admitting: *Deleted

## 2021-03-30 DIAGNOSIS — M0609 Rheumatoid arthritis without rheumatoid factor, multiple sites: Secondary | ICD-10-CM

## 2021-03-30 DIAGNOSIS — Z79899 Other long term (current) drug therapy: Secondary | ICD-10-CM

## 2021-03-30 MED ORDER — ENBREL SURECLICK 50 MG/ML ~~LOC~~ SOAJ: 50.0000 mg | SUBCUTANEOUS | 0 refills | Status: DC

## 2021-03-30 NOTE — Telephone Encounter (Signed)
Next Visit: 04/29/2021  Last Visit: 11/26/2020  Last Fill: 01/09/2021  DX: Rheumatoid arthritis of multiple sites with negative rheumatoid factor   Current Dose per office note 11/26/2020: Enbrel 50 mg sq injections once weekly  Labs: 02/11/2021 CMP - Glucose 291, BUN 28, Creat 1.17, eGFR 48, BUN/Creatinine Ratio 24, Sodium 133, 01/09/2021 CBC - Glucose is elevated-232.  Creatinine is elevated and continues to trend up.  Please clarify if the patient has been taking any NSAIDs or if she has had any other medication changes? GFR is low-37.  Please forward lab work to PCP.   WBC count remains borderline elevated. Rest of CBC WNL.  We will continue to monitor.   TB Gold: 07/14/2020, negative   Okay to refill Enbrel?

## 2021-04-02 DIAGNOSIS — R8271 Bacteriuria: Secondary | ICD-10-CM | POA: Diagnosis not present

## 2021-04-02 DIAGNOSIS — N3000 Acute cystitis without hematuria: Secondary | ICD-10-CM | POA: Diagnosis not present

## 2021-04-09 ENCOUNTER — Other Ambulatory Visit: Payer: Self-pay

## 2021-04-09 ENCOUNTER — Ambulatory Visit (INDEPENDENT_AMBULATORY_CARE_PROVIDER_SITE_OTHER): Payer: Medicare Other | Admitting: Orthopedic Surgery

## 2021-04-09 ENCOUNTER — Encounter: Payer: Self-pay | Admitting: Orthopedic Surgery

## 2021-04-09 VITALS — BP 152/82 | HR 80 | Temp 96.9°F | Ht 62.0 in | Wt 165.4 lb

## 2021-04-09 DIAGNOSIS — E042 Nontoxic multinodular goiter: Secondary | ICD-10-CM

## 2021-04-09 DIAGNOSIS — E785 Hyperlipidemia, unspecified: Secondary | ICD-10-CM

## 2021-04-09 DIAGNOSIS — I700 Atherosclerosis of aorta: Secondary | ICD-10-CM | POA: Diagnosis not present

## 2021-04-09 DIAGNOSIS — E1169 Type 2 diabetes mellitus with other specified complication: Secondary | ICD-10-CM

## 2021-04-09 DIAGNOSIS — I152 Hypertension secondary to endocrine disorders: Secondary | ICD-10-CM

## 2021-04-09 DIAGNOSIS — Z794 Long term (current) use of insulin: Secondary | ICD-10-CM | POA: Diagnosis not present

## 2021-04-09 DIAGNOSIS — M25561 Pain in right knee: Secondary | ICD-10-CM | POA: Diagnosis not present

## 2021-04-09 DIAGNOSIS — I10 Essential (primary) hypertension: Secondary | ICD-10-CM | POA: Diagnosis not present

## 2021-04-09 DIAGNOSIS — D72829 Elevated white blood cell count, unspecified: Secondary | ICD-10-CM | POA: Diagnosis not present

## 2021-04-09 DIAGNOSIS — E1159 Type 2 diabetes mellitus with other circulatory complications: Secondary | ICD-10-CM

## 2021-04-09 DIAGNOSIS — E118 Type 2 diabetes mellitus with unspecified complications: Secondary | ICD-10-CM

## 2021-04-09 DIAGNOSIS — Z23 Encounter for immunization: Secondary | ICD-10-CM

## 2021-04-09 DIAGNOSIS — G4709 Other insomnia: Secondary | ICD-10-CM | POA: Diagnosis not present

## 2021-04-09 DIAGNOSIS — N390 Urinary tract infection, site not specified: Secondary | ICD-10-CM | POA: Diagnosis not present

## 2021-04-09 DIAGNOSIS — M0609 Rheumatoid arthritis without rheumatoid factor, multiple sites: Secondary | ICD-10-CM | POA: Diagnosis not present

## 2021-04-09 DIAGNOSIS — F419 Anxiety disorder, unspecified: Secondary | ICD-10-CM

## 2021-04-09 DIAGNOSIS — G8929 Other chronic pain: Secondary | ICD-10-CM

## 2021-04-09 MED ORDER — LORAZEPAM 0.5 MG PO TABS: 0.5000 mg | ORAL_TABLET | Freq: Every day | ORAL | 3 refills | Status: DC

## 2021-04-09 NOTE — Patient Instructions (Signed)
Referral to endocrinology made.   Please increase insulin to 40 unit every morning and 38 units every evening.   Try voltaren gel for right sided pain.

## 2021-04-09 NOTE — Progress Notes (Addendum)
Careteam: Patient Care Team: [name], NP as PCP - General (Adult Health Nurse Practitioner)  Seen by: Windell Moulding, AGNP-C  PLACE OF SERVICE:  [place] Directive information    Allergies  Allergen Reactions   Amaryl [Glimepiride]     Don't Remember   Amlodipine     LE edema on 7.5mg  daily. Tolerates 5mg  daily just fine   Codeine     SICK   Jardiance [Empagliflozin]     Urinary tract infection- SIDE EFFECT   Pneumovax 23 [Pneumococcal Vac Polyvalent]     Red / Swelling / Hot to touch SIDE EFFECTS   Wellbutrin [Bupropion]     Unknown   Latex Rash    Chief Complaint  Patient presents with   Medical Management of Chronic Issues    Patient presents today for a 1 month follow-up.   Quality Metric Gaps    A1C, zoster, flu and COVID booster vaccines     HPI: Patient is a 80 y.o. female seen today for medical management of chronic conditions.   Blood sugars averaging  200-300. Reports sugars higher at dinnertime. Continues to take 70/30 insulin bid, units vary on readings, she will take 36-40 units per dose. Referral to endocrinology made last visit. She states she was never contacted. Requesting another referral be made. Reports following low carb diet and limits sweets. Recently had diabetic eye exam, asked to have note   Seeing new orthopedic specialist due to right knee pain. She has been told she is bone on bone and should have knee replacement surgery. She needs to have better glycemic control before operation. Knee pain managed with voltaren gel. No recent falls. Walks without assistive device.   RUQ pain, aggravated with movement. Pain rated 2/10, intermittent. Denies injury to area. She has been applying voltaren gel, reports some relief.   History of multinodular goiter requiring yearly ultrasounds. Last u/s 2020, no further workup recommended. Reports some fatigue at times. Denies thyroid pain or changes to neck.   Recently seen by Dr. Jeffie Pollock for  UTI, she was given Cipro, last dose to be taken 10/07. Reports improved symptoms.   Plans to see rheumatology this month. Continues to take Enbrel.   No recent panic attacks, trouble sleeping at night few times weekly. Continues to take ativan 0.5 mg qhs prn.   Flu vaccine administered today.   Review of Systems:  Review of Systems  Constitutional:  Positive for malaise/fatigue. Negative for chills, fever and weight loss.  HENT: Negative.    Eyes: Negative.   Respiratory:  Negative for cough, shortness of breath and wheezing.   Cardiovascular:  Negative for chest pain and leg swelling.  Gastrointestinal:  Negative for abdominal pain, blood in stool, constipation, diarrhea, heartburn, nausea and vomiting.  Genitourinary:  Negative for dysuria, frequency and hematuria.  Musculoskeletal:  Positive for joint pain. Negative for falls.       Right knee pain  Skin: Negative.   Neurological:  Negative for dizziness, tingling and weakness.  Psychiatric/Behavioral:  Negative for depression. The patient is not nervous/anxious.    Past Medical History:  Diagnosis Date   Acute cystitis    UTI   Anxiety    Arthritis    Benign positional vertigo    Dermatitis    Diabetes mellitus without complication ([place])    Family history of adverse reaction to anesthesia    GERD (gastroesophageal reflux disease)    Goiter, nontoxic, multinodular    Gout  H/O: hysterectomy    History of tobacco use    Hx laparoscopic cholecystectomy    Hypercholesterolemia    Hypertension    Incomplete bladder emptying    Insomnia    Postmenopausal atrophic vaginitis    RA (rheumatoid arthritis) ([place])    Rectocele    Renal calculus    Sleep apnea    Thyroid condition    Type 2 diabetes mellitus ([place])    Urolithiasis    Vaginal enterocele    Past Surgical History:  Procedure Laterality Date   CARPAL TUNNEL RELEASE  2018   CATARACT EXTRACTION, BILATERAL     CHOLECYSTECTOMY     COLONOSCOPY     07/11/2009,  02/2015   GALLBLADDER SURGERY     KNEE ARTHROSCOPY Right 05/20/2020   Procedure: RIGHT KNEE ARTHROSCOPY AND DEBRIDEMENT;  Surgeon: [name], MD;  Location: [facility];  Service: Orthopedics;  Laterality: Right;   PARTIAL HYSTERECTOMY     [place]ies left intact   SQUAMOUS CELL CARCINOMA EXCISION  05/2018   Social History:   reports that she quit smoking about 42 years ago. Her smoking use included cigarettes. She has a 2.00 pack-year smoking history. She has never used smokeless tobacco. She reports that she does not drink alcohol and does not use drugs.  Family History  Problem Relation Age of Onset   Diabetes Mother    Heart disease Father 93   Diabetes Son    Crohn's disease Son     Medications: Patient's Medications  New Prescriptions   No medications on file  Previous Medications   ACCU-CHEK SOFTCLIX LANCETS LANCETS    Use to test blood sugar three times daily. Dx: E11.8   ASPIRIN EC 81 MG TABLET    Take 81 mg by mouth daily.   CHLORTHALIDONE (HYGROTON) 25 MG TABLET    Take 1 tablet (25 mg total) by mouth daily.   CIPROFLOXACIN (CIPRO) 250 MG TABLET    Take 250 mg by mouth 2 (two) times daily.   CONTINUOUS BLOOD GLUC SENSOR (FREESTYLE LIBRE 2 SENSOR) MISC    1 application by Does not apply route every 14 (fourteen) days.   CRANBERRY PO    Take by mouth daily.   DICLOFENAC SODIUM (VOLTAREN EX)    Apply 1 application topically 3 (three) times daily as needed.   ESTRADIOL (ESTRACE) 0.1 MG/GM VAGINAL CREAM    Place 1 Applicatorful vaginally 3 (three) times a week.   ETANERCEPT (ENBREL SURECLICK) 50 MG/ML INJECTION    Inject 50 mg into the skin once a week.   FLUOCINONIDE (LIDEX) 0.05 % EXTERNAL SOLUTION    Apply 1 application topically daily as needed.   GLUCOSE BLOOD (ACCU-CHEK GUIDE) TEST STRIP    Use to check blood sugar three times daily Dx: E11.8   INSULIN ASPART PROTAMINE- ASPART (NOVOLOG MIX 70/30) (70-30) 100 UNIT/ML INJECTION    Inject 36 Units into the  skin in the morning and at bedtime. Increase by 2 units every 3 days until sugars are < 150   IRBESARTAN (AVAPRO) 300 MG TABLET    Take 1 tablet (300 mg total) by mouth daily.   LOPERAMIDE (IMODIUM A-D) 2 MG TABLET    Take 2 mg by mouth as needed for diarrhea or loose stools.   LORAZEPAM (ATIVAN) 0.5 MG TABLET    TAKE ONE TABLET BY MOUTH EVERY NIGHT AT BEDTIME   MELATONIN PO    Take 10 mg by mouth at bedtime as needed.  OMEPRAZOLE 20 MG TBDD    Take 20 mg by mouth daily.   ONDANSETRON (ZOFRAN) 4 MG TABLET    Take 4 mg by mouth 3 (three) times daily as needed for nausea or vomiting.   PHENAZOPYRIDINE (PYRIDIUM) 200 MG TABLET    Take 200 mg by mouth 3 (three) times daily.   PROBIOTIC PRODUCT (PROBIOTIC PO)    Take by mouth daily.   SIMVASTATIN (ZOCOR) 20 MG TABLET    TAKE ONE TABLET BY MOUTH EVERY EVENING   SPIRONOLACTONE (ALDACTONE) 25 MG TABLET    Take 1 tablet (25 mg total) by mouth daily.  Modified Medications   No medications on file  Discontinued Medications   No medications on file    Physical Exam:  Vitals:   04/09/21 1102  BP: (!) 152/82  Pulse: 80  Temp: (!) 96.9 F (36.1 C)  SpO2: 97%  Weight: 165 lb 6.4 oz (75 kg)  Height: 5\' 2"  (1.575 m)   Body mass index is 30.25 kg/m. Wt Readings from Last 3 Encounters:  04/09/21 165 lb 6.4 oz (75 kg)  02/11/21 165 lb 12.8 oz (75.2 kg)  01/23/21 165 lb 12.8 oz (75.2 kg)    Physical Exam Vitals reviewed.  Constitutional:      General: She is not in acute distress. HENT:     Head: Normocephalic.     Nose: Nose normal.     Mouth/Throat:     Mouth: Mucous membranes are moist.  Eyes:     General:        Right eye: No discharge.        Left eye: No discharge.  Neck:     Thyroid: No thyroid mass, thyromegaly or thyroid tenderness.     Vascular: No carotid bruit.  Cardiovascular:     Rate and Rhythm: Normal rate and regular rhythm.     Pulses: Normal pulses.     Heart sounds: Normal heart sounds. No murmur  heard. Pulmonary:     Effort: Pulmonary effort is normal. No respiratory distress.     Breath sounds: Normal breath sounds. No wheezing.  Abdominal:     General: Bowel sounds are normal. There is no distension.     Palpations: Abdomen is soft.     Tenderness: There is no abdominal tenderness.  Musculoskeletal:     Cervical back: Normal range of motion.     Right knee: Crepitus present. No swelling or erythema. Normal range of motion. Tenderness present over the MCL.     Right lower leg: No edema.     Left lower leg: No edema.  Lymphadenopathy:     Cervical: No cervical adenopathy.  Skin:    General: Skin is warm and dry.     Capillary Refill: Capillary refill takes less than 2 seconds.  Neurological:     General: No focal deficit present.     Mental Status: She is alert and oriented to person, place, and time.     Motor: No weakness.     Gait: Gait abnormal.  Psychiatric:        Mood and Affect: Mood normal.        Behavior: Behavior normal.    Labs reviewed: Basic Metabolic Panel: Recent Labs    06/03/20 0000 06/11/20 1036 01/09/21 1337 01/14/21 1247 02/11/21 1418  NA 140   < > 137 136 133*  K 4.2   < > 5.3 4.9 4.4  CL 102   < > 99 100 99  CO2  27   < > 28 23 24   GLUCOSE 182*   < > 232* 241* 291*  BUN 20   < > 34* 28* 28*  CREATININE 1.06*   < > 1.36* 1.37* 1.17*  CALCIUM 9.5   < > 9.5 9.0 9.4  MG  --   --   --   --  1.6  TSH 1.67  --   --   --   --    < > = values in this interval not displayed.   Liver Function Tests: Recent Labs    10/08/20 1341 01/09/21 1337 02/11/21 1418  AST 11 14 12   ALT 12 10 11   BILITOT 1.0 1.0 0.9  PROT 6.6 6.6 7.2   No results for input(s): LIPASE, AMYLASE in the last 8760 hours. No results for input(s): AMMONIA in the last 8760 hours. CBC: Recent Labs    06/03/20 0000 10/08/20 1341 01/09/21 1337  WBC 13.1* 11.1* 11.7*  NEUTROABS 9,406* 7,637 7,757  HGB 13.7 12.6 12.8  HCT 39.6 37.9 37.6  MCV 91.9 92.2 93.1  PLT 386  310 347   Lipid Panel: No results for input(s): CHOL, HDL, LDLCALC, TRIG, CHOLHDL, LDLDIRECT in the last 8760 hours. TSH: Recent Labs    06/03/20 0000  TSH 1.67   A1C: Lab Results  Component Value Date   HGBA1C 8.0 (H) 06/03/2020     Assessment/Plan 1. Need for influenza vaccination - Flu Vaccine QUAD High Dose(Fluad)  2. Type 2 diabetes mellitus with complication, with long-term current use of insulin (HCC) - blood sugars averaging 200-300 - would ike better glycemic control so she can have knee replacement - Ambulatory referral to Endocrinology - Hemoglobin A1c- 8.1 04/09/2021  3. Multinodular thyroid - ultrasound in 2020 did not recommend further f/u- no change in nodules after 5 years - reports some fatigue today - TSH- 1.81 04/09/2021  4. Other insomnia - d/t increased anxiety at night - cont ativan - LORazepam (ATIVAN) 0.5 MG tablet; Take 1 tablet (0.5 mg total) by mouth at bedtime.  Dispense: 30 tablet; Refill: 3  5. Anxiety - no recent panic attacks - LORazepam (ATIVAN) 0.5 MG tablet; Take 1 tablet (0.5 mg total) by mouth at bedtime.  Dispense: 30 tablet; Refill: 3  6. Aortic atherosclerosis (HCC) - cont aspirin 81 mg daily - cont statin  7. Acute lower UTI - followed by urology- Dr. Jeffie Pollock - on Cipro, reports improved dysuria/frequency  8. Hypertension associated with diabetes ([place]) - controlled - cont chlorthalidone and irbesartan  9. Rheumatoid arthritis of multiple sites with negative rheumatoid factor ([place]) - followed by rheumatology - cont Enbrel  10. Hyperlipidemia associated with type 2 diabetes mellitus ([place]) - LDL 55 04/2020 - not fasting today - lipid panel- future  11. Chronic pain of right knee - ongoing, seeing new specialist at [facility] - pain stable with Voltaren gel  - not a candidate for knee replacement until better glycemic control achieved  12. Leukocytosis, unspecified type - WBC 11.7 01/2021 - remains on  antibiotics today - cbc/diff -WBC 9.5   Total time: 35 minutes. Greater than 50% of total time spent ding patient education regarding glycemic control, symptoms management for knee pain and past thyroid studies.   Next appt: 07/09/2021  Windell Moulding, [facility] [phone]

## 2021-04-10 MED ORDER — INSULIN ASPART PROT & ASPART (70-30 MIX) 100 UNIT/ML ~~LOC~~ SUSP: 38.0000 [IU] | Freq: Every day | SUBCUTANEOUS | 11 refills | Status: DC

## 2021-04-11 LAB — CBC WITH DIFFERENTIAL/PLATELET
Absolute Monocytes: 428 cells/uL (ref 200–950)
Basophils Absolute: 67 cells/uL (ref 0–200)
Basophils Relative: 0.7 %
Eosinophils Absolute: 247 cells/uL (ref 15–500)
Eosinophils Relative: 2.6 %
HCT: 36.2 % (ref 35.0–45.0)
Hemoglobin: 11.8 g/dL (ref 11.7–15.5)
Lymphs Abs: 2404 cells/uL (ref 850–3900)
MCH: 31.9 pg (ref 27.0–33.0)
MCHC: 32.6 g/dL (ref 32.0–36.0)
MCV: 97.8 fL (ref 80.0–100.0)
MPV: 11.3 fL (ref 7.5–12.5)
Monocytes Relative: 4.5 %
Neutro Abs: 6356 cells/uL (ref 1500–7800)
Neutrophils Relative %: 66.9 %
Platelets: 335 10*3/uL (ref 140–400)
RBC: 3.7 10*6/uL — ABNORMAL LOW (ref 3.80–5.10)
RDW: 12.2 % (ref 11.0–15.0)
Total Lymphocyte: 25.3 %
WBC: 9.5 10*3/uL (ref 3.8–10.8)

## 2021-04-11 LAB — HEMOGLOBIN A1C
Hgb A1c MFr Bld: 8.1 % of total Hgb — ABNORMAL HIGH (ref <5.7)
Mean Plasma Glucose: 186 mg/dL
eAG (mmol/L): 10.3 mmol/L

## 2021-04-11 LAB — TEST AUTHORIZATION

## 2021-04-11 LAB — TSH: TSH: 1.81 mIU/L (ref 0.40–4.50)

## 2021-04-16 NOTE — Progress Notes (Signed)
Office Visit Note  Patient: Annette Suarez             Date of Birth: [birth year]           MRN: [id number]             PCP: [name], NP Referring: [name], NP Visit Date: 04/29/2021 Occupation: @GUAROCC @  Subjective:  Fatigue and joint pain.   History of Present Illness: Annette Suarez is a 80 y.o. female with a history of rheumatoid arthritis and osteoarthritis.She has been taking Enbrel 50 mg subcu weekly.  She has not noticed any joint swelling.  She states she has been experiencing increased pain in her forearm and her hands recently.  She continues to have generalized muscle pain.  She also has increased muscle cramps in her legs at nighttime.  She has noticed muscle cramps in her hands as well.  She states she had right knee joint arthroscopic surgery November 2021.  She has not had any relief in her right knee joint pain.  She is advised total knee replacement but she is not ready for it.  She has been experiencing increased fatigue.  Activities of Daily Living:  Patient reports morning stiffness for 2-3 hours.   Patient Denies nocturnal pain.  Difficulty dressing/grooming: Denies Difficulty climbing stairs: Reports Difficulty getting out of chair: Reports Difficulty using hands for taps, buttons, cutlery, and/or writing: Reports  Review of Systems  Constitutional:  Positive for fatigue.  HENT:  Positive for mouth dryness. Negative for mouth sores and nose dryness.   Eyes:  Positive for dryness. Negative for pain and itching.  Respiratory:  Positive for shortness of breath. Negative for difficulty breathing.   Cardiovascular:  Negative for chest pain and palpitations.  Gastrointestinal:  Negative for blood in stool, constipation and diarrhea.  Endocrine: Negative for increased urination.  Genitourinary:  Negative for difficulty urinating.  Musculoskeletal:  Positive for joint pain, joint pain, myalgias, morning stiffness, muscle tenderness and myalgias. Negative for  joint swelling.  Skin:  Negative for color change, rash and sensitivity to sunlight.  Allergic/Immunologic: Positive for susceptible to infections.  Neurological:  Negative for dizziness, numbness, headaches and memory loss.  Hematological:  Negative for bruising/bleeding tendency.  Psychiatric/Behavioral:  Negative for confusion.    PMFS History:  Patient Active Problem List   Diagnosis Date Noted   Subclavian arterial stenosis ([place]) 09/11/2020   Aortic atherosclerosis ([place]) 09/11/2020   Alteration in blood pressure 06/11/2020   Old complex tear of medial meniscus of right knee    Old complex tear of lateral meniscus of right knee    Primary osteoarthritis of both feet 12/13/2017   Dehydration 09/09/2017   Acute lower UTI 09/09/2017   Nausea & vomiting 09/08/2017   Primary osteoarthritis of both hands 04/05/2017   Left carpal tunnel syndrome 11/15/2016   Rheumatoid arthritis of multiple sites with negative rheumatoid factor ([place]) 10/06/2016   High risk medication use 10/06/2016   Dyslipidemia 10/06/2016   Pain in both hands 09/08/2016   Hypertension associated with diabetes ([place]) 09/08/2016   Type 2 diabetes mellitus  09/08/2016   Gastroesophageal reflux disease without esophagitis 09/08/2016   Osteopenia of multiple sites 09/08/2016   History of anxiety 09/08/2016   Benign paroxysmal positional vertigo 09/08/2016   Sleep apnea 09/08/2016    Past Medical History:  Diagnosis Date   Acute cystitis    UTI   Anxiety    Arthritis    Benign positional vertigo  Dermatitis    Diabetes mellitus without complication ([place])    Family history of adverse reaction to anesthesia    GERD (gastroesophageal reflux disease)    Goiter, nontoxic, multinodular    Gout    H/O: hysterectomy    History of tobacco use    Hx laparoscopic cholecystectomy    Hypercholesterolemia    Hypertension    Incomplete bladder emptying    Insomnia    Postmenopausal atrophic vaginitis    RA  (rheumatoid arthritis) (HCC)    Rectocele    Renal calculus    Sleep apnea    Thyroid condition    Type 2 diabetes mellitus ([place])    Urolithiasis    Vaginal enterocele     Family History  Problem Relation Age of Onset   Diabetes Mother    Heart disease Father 2   Diabetes Son    Crohn's disease Son    Past Surgical History:  Procedure Laterality Date   CARPAL TUNNEL RELEASE  2018   CATARACT EXTRACTION, BILATERAL     CHOLECYSTECTOMY     COLONOSCOPY     07/11/2009, 02/2015   GALLBLADDER SURGERY     KNEE ARTHROSCOPY Right 05/20/2020   Procedure: RIGHT KNEE ARTHROSCOPY AND DEBRIDEMENT;  Surgeon: [name], MD;  Location: [facility];  Service: Orthopedics;  Laterality: Right;   PARTIAL HYSTERECTOMY     Overies left intact   SQUAMOUS CELL CARCINOMA EXCISION  05/2018   Social History   Social History Narrative   Diet: Blank      Do you drink/ eat things with caffeine? Yes      Marital status:  Married                             What year were you married ? 196      Do you live in a house, apartment,assistred living, condo, trailer, etc.)? House      Is it one or more stories? 1      How many persons live in your home ? 2      Do you have any pets in your home ?(please list)  1 Sam-Dog      Highest Level of education completed: 12      Current or past profession: Office      Do you exercise?   Some                           Type & how often  Walk Daily      ADVANCED DIRECTIVES (Please bring copies)      Do you have a living will? Blank      Do you have a DNR form?  Blank                     If not, do you want to discuss one?       Do you have signed POA?HPOA forms?  Yes               If so, please bring to your appointment      FUNCTIONAL STATUS- To be completed by Spouse / child / Staff       Do you have difficulty bathing or dressing yourself ?  No      Do you have difficulty preparing food or eating ?  No      Do you  have difficulty  managing your mediation ?  No      Do you have difficulty managing your finances ?  No      Do you have difficulty affording your medication ?  Yes (Some)      Immunization History  Administered Date(s) Administered   Fluad Quad(high Dose 65+) 04/09/2021   Influenza Split 03/28/2008, 03/05/2010, 04/17/2012, 04/20/2013   Influenza, High Dose Seasonal PF 02/08/2020   Influenza-Unspecified 05/11/2011, 04/22/2014, 04/15/2015, 02/20/2016, 03/02/2017, 03/09/2018, 03/20/2019, 03/21/2020   PFIZER(Purple Top)SARS-COV-2 Vaccination 07/24/2019, 08/06/2019, 08/27/2019, 04/21/2020   Pneumococcal Conjugate-13 05/02/2014   Pneumococcal Polysaccharide-23 04/23/2008   Tdap 05/01/2013   Zoster, Live 04/17/2012     Objective: Vital Signs: BP (!) 151/81 (BP Location: Right Arm, Patient Position: Sitting, Cuff Size: Normal)   Pulse 84   Ht 5\' 2"  (1.575 m)   Wt 163 lb 12.8 oz (74.3 kg)   BMI 29.96 kg/m    Physical Exam Vitals and nursing note reviewed.  Constitutional:      Appearance: She is well-developed.  HENT:     Head: Normocephalic and atraumatic.  Eyes:     Conjunctiva/sclera: Conjunctivae normal.  Cardiovascular:     Rate and Rhythm: Normal rate and regular rhythm.     Heart sounds: Normal heart sounds.  Pulmonary:     Effort: Pulmonary effort is normal.     Breath sounds: Normal breath sounds.  Abdominal:     General: Bowel sounds are normal.     Palpations: Abdomen is soft.  Musculoskeletal:     Cervical back: Normal range of motion.  Lymphadenopathy:     Cervical: No cervical adenopathy.  Skin:    General: Skin is warm and dry.     Capillary Refill: Capillary refill takes less than 2 seconds.  Neurological:     Mental Status: She is alert and oriented to person, place, and time.  Psychiatric:        Behavior: Behavior normal.     Musculoskeletal Exam: C-spine was in good range of motion.  She has some stiffness with range of motion of the shoulder joints.  Elbow joints  and wrist joints with good range of motion.  There was no synovitis over MCPs PIPs or DIPs.  She bilateral PIP and DIP thickening.  Hip joints with good range of motion.  She had warmth on palpation of her right knee joint.  Left knee joint was in good range of motion.  There is no tenderness over ankles or MTPs.  CDAI Exam: CDAI Score: 2.6  Patient Global: 3 mm; Provider Global: 3 mm Swollen: 1 ; Tender: 1  Joint Exam 04/29/2021      Right  Left  Knee  Swollen Tender        Investigation: No additional findings.  Imaging: No results found.  Recent Labs: Lab Results  Component Value Date   WBC 9.5 04/09/2021   HGB 11.8 04/09/2021   PLT 335 04/09/2021   NA 133 (L) 02/11/2021   K 4.4 02/11/2021   CL 99 02/11/2021   CO2 24 02/11/2021   GLUCOSE 291 (H) 02/11/2021   BUN 28 (H) 02/11/2021   CREATININE 1.17 (H) 02/11/2021   BILITOT 0.9 02/11/2021   ALKPHOS 83 07/25/2019   AST 12 02/11/2021   ALT 11 02/11/2021   PROT 7.2 02/11/2021   ALBUMIN 4.6 07/25/2019   CALCIUM 9.4 02/11/2021   GFRAA 43 (L) 01/09/2021   QFTBGOLDPLUS NEGATIVE 07/14/2020    Speciality Comments: Recieves Enbrel through [employer]  [employer]-ACY 02/20/2019 Osteopenia managed by Dr. Annice Pih 02/20/2019  Procedures:  No procedures performed Allergies: Amaryl [glimepiride], Amlodipine, Codeine, Jardiance [empagliflozin], Pneumovax 23 [pneumococcal vac polyvalent], Wellbutrin [bupropion], and Latex   Assessment / Plan:     Visit Diagnoses: Rheumatoid arthritis of multiple sites with negative rheumatoid factor (HCC)-she complains of pain and discomfort in her right knee joint.  She also has muscle cramps.  No synovitis was noted in any of her joints except for her right knee joint swelling which she has noticed since she had meniscal tear.  In my opinion her rheumatoid arthritis is well controlled.  High risk medication use - Enbrel 50 mg sq injections once weekly. (inadequate response to [place]). -She  had CBC on April 09, 2021 which was a stable and the values were reviewed.  TB gold was negative on July 14, 2020.  I will check CMP today.  Her next labs will be in January along with TB Gold.  Plan: COMPLETE METABOLIC PANEL WITH GFR.  She was advised to stop Enbrel in case she develops an infection.  Annual skin examination to screen for nonmelanoma skin cancer was discussed.  Information regarding immunization was also placed in the AVS.  Primary osteoarthritis of both hands-she has bilateral DIP and PIP thickening consistent with osteoarthritis.  Right knee pain-she continues to have warmth and swelling in her right knee joint.  She had arthroscopic surgery in December 2021.  She did not get any relief from the surgery.  She states she went for a second opinion to another orthopedic surgeon who recommended total knee replacement.  Her hemoglobin A1c has to be controlled prior to her surgery.  She has an appointment coming up with the endocrinologist next month.  Primary osteoarthritis of both feet-she continues to have pain and discomfort in her feet.  Osteopenia of multiple sites - Managed by her PCP-DEXA 02/20/19  Trochanteric bursitis, right hip - Resolved  Nocturnal muscle cramps-she continues to have muscle cramps.  I have reminded her to use magnesium malate at bedtime to relieve muscle cramps.  Stretching exercises were also emphasized.  History of vertigo  Gastroesophageal reflux disease without esophagitis  Type 2 diabetes mellitus -she has an appointment with the endocrinologist in November per patient.  Essential hypertension-blood pressure was elevated.  She has been advised to monitor blood pressure closely and follow-up with the PCP.  History of sleep apnea  History of anxiety  History of hyperlipidemia-increased risk of heart disease with rheumatoid arthritis was discussed.  Dietary modifications and exercise was emphasized.  Information was placed in the  AVS.  Former smoker  Orders: Orders Placed This Encounter  Procedures   COMPLETE METABOLIC PANEL WITH GFR    No orders of the defined types were placed in this encounter.    Follow-Up Instructions: Return in about 3 months (around 07/30/2021) for Rheumatoid arthritis, Osteoarthritis.   Bo Merino, MD  Note - This record has been created using Editor, commissioning.  Chart creation errors have been sought, but may not always  have been located. Such creation errors do not reflect on  the standard of medical care.

## 2021-04-29 ENCOUNTER — Telehealth: Payer: Self-pay | Admitting: Pharmacist

## 2021-04-29 ENCOUNTER — Other Ambulatory Visit: Payer: Self-pay

## 2021-04-29 ENCOUNTER — Ambulatory Visit: Payer: Medicare Other | Admitting: Rheumatology

## 2021-04-29 ENCOUNTER — Encounter: Payer: Self-pay | Admitting: Rheumatology

## 2021-04-29 VITALS — BP 151/81 | HR 84 | Ht 62.0 in | Wt 163.8 lb

## 2021-04-29 DIAGNOSIS — M19041 Primary osteoarthritis, right hand: Secondary | ICD-10-CM

## 2021-04-29 DIAGNOSIS — M0609 Rheumatoid arthritis without rheumatoid factor, multiple sites: Secondary | ICD-10-CM

## 2021-04-29 DIAGNOSIS — R252 Cramp and spasm: Secondary | ICD-10-CM

## 2021-04-29 DIAGNOSIS — Z87898 Personal history of other specified conditions: Secondary | ICD-10-CM | POA: Diagnosis not present

## 2021-04-29 DIAGNOSIS — Z8669 Personal history of other diseases of the nervous system and sense organs: Secondary | ICD-10-CM

## 2021-04-29 DIAGNOSIS — K219 Gastro-esophageal reflux disease without esophagitis: Secondary | ICD-10-CM | POA: Diagnosis not present

## 2021-04-29 DIAGNOSIS — G8929 Other chronic pain: Secondary | ICD-10-CM

## 2021-04-29 DIAGNOSIS — Z8639 Personal history of other endocrine, nutritional and metabolic disease: Secondary | ICD-10-CM

## 2021-04-29 DIAGNOSIS — M19071 Primary osteoarthritis, right ankle and foot: Secondary | ICD-10-CM

## 2021-04-29 DIAGNOSIS — Z8659 Personal history of other mental and behavioral disorders: Secondary | ICD-10-CM

## 2021-04-29 DIAGNOSIS — I10 Essential (primary) hypertension: Secondary | ICD-10-CM

## 2021-04-29 DIAGNOSIS — M19042 Primary osteoarthritis, left hand: Secondary | ICD-10-CM

## 2021-04-29 DIAGNOSIS — Z794 Long term (current) use of insulin: Secondary | ICD-10-CM

## 2021-04-29 DIAGNOSIS — Z79899 Other long term (current) drug therapy: Secondary | ICD-10-CM | POA: Diagnosis not present

## 2021-04-29 DIAGNOSIS — E118 Type 2 diabetes mellitus with unspecified complications: Secondary | ICD-10-CM

## 2021-04-29 DIAGNOSIS — Z87891 Personal history of nicotine dependence: Secondary | ICD-10-CM

## 2021-04-29 DIAGNOSIS — M25561 Pain in right knee: Secondary | ICD-10-CM

## 2021-04-29 DIAGNOSIS — M8589 Other specified disorders of bone density and structure, multiple sites: Secondary | ICD-10-CM | POA: Diagnosis not present

## 2021-04-29 DIAGNOSIS — M19072 Primary osteoarthritis, left ankle and foot: Secondary | ICD-10-CM

## 2021-04-29 DIAGNOSIS — M7061 Trochanteric bursitis, right hip: Secondary | ICD-10-CM

## 2021-04-29 NOTE — Telephone Encounter (Signed)
Patient had OV and brought renewal application from [place] for ENBREL patient assistance. Signed patient portion, med list, insurance card copy, and PA approval letter collected.   Pending provider signature - placed in folder to be signed  Knox Saliva, PharmD, MPH, BCPS Clinical Pharmacist (Rheumatology and Pulmonology)

## 2021-04-29 NOTE — Patient Instructions (Addendum)
Standing Labs We placed an order today for your standing lab work.   Please have your standing labs drawn in January and every 3 months TB Gold in January  If possible, please have your labs drawn 2 weeks prior to your appointment so that the provider can discuss your results at your appointment.  Please note that you may see your imaging and lab results in [place] before we have reviewed them. We may be awaiting multiple results to interpret others before contacting you. Please allow our office up to 72 hours to thoroughly review all of the results before contacting the office for clarification of your results.  We have open lab daily: Monday through Thursday from 1:30-4:30 PM and Friday from 1:30-4:00 PM at the office of Dr. Bo Merino, [facility].   Please be advised, all patients with office appointments requiring lab work will take precedent over walk-in lab work.  If possible, please come for your lab work on Monday and Friday afternoons, as you may experience shorter wait times. The office is located at [address], [place], [place], [place] [address] No appointment is necessary.   Labs are drawn by [facility]. Please bring your co-pay at the time of your lab draw.  You may receive a bill from [facility] for your lab work.  If you wish to have your labs drawn at another location, please call the office 24 hours in advance to send orders.  If you have any questions regarding directions or hours of operation,  please call [phone].   As a reminder, please drink plenty of water prior to coming for your lab work. Thanks!   Vaccines You are taking a medication(s) that can suppress your immune system.  The following immunizations are recommended: Flu annually Covid-19  Td/Tdap (tetanus, diphtheria, pertussis) every 10 years Pneumonia (Prevnar 15 then Pneumovax 23 at least 1 year apart.  Alternatively, can take Prevnar 20 without needing additional dose) Shingrix:  2 doses from 4 weeks to 6 months apart  Please check with your PCP to make sure you are up to date.   If you have signs or symptoms of an infection or start antibiotics: First, call your PCP for workup of your infection. Hold your medication through the infection, until you complete your antibiotics, and until symptoms resolve if you take the following: Injectable medication (Actemra, Benlysta, Cimzia, Cosentyx, Enbrel, Humira, Kevzara, Orencia, Remicade, Simponi, Stelara, Taltz, Tremfya) Methotrexate Leflunomide (Arava) Mycophenolate (Cellcept) Morrie Sheldon, Olumiant, or Rinvoq   Recommend annual skin exam to screen for skin cancer while you are on Enbrel.   Heart Disease Prevention   Your inflammatory disease increases your risk of heart disease which includes heart attack, stroke, atrial fibrillation (irregular heartbeats), high blood pressure, heart failure and atherosclerosis (plaque in the arteries).  It is important to reduce your risk by:   Keep blood pressure, cholesterol, and blood sugar at healthy levels   Smoking Cessation   Maintain a healthy weight  BMI 20-25   Eat a healthy diet  Plenty of fresh fruit, vegetables, and whole grains  Limit saturated fats, foods high in sodium, and added sugars  DASH and Mediterranean diet   Increase physical activity  Recommend moderate physically activity for 150 minutes per week/ 30 minutes a day for five days a week These can be broken up into three separate ten-minute sessions during the day.   Reduce Stress  Meditation, slow breathing exercises, yoga, coloring books  Dental visits twice a year

## 2021-04-30 LAB — COMPLETE METABOLIC PANEL WITH GFR
AG Ratio: 1.8 (calc) (ref 1.0–2.5)
ALT: 12 U/L (ref 6–29)
AST: 15 U/L (ref 10–35)
Albumin: 4.4 g/dL (ref 3.6–5.1)
Alkaline phosphatase (APISO): 72 U/L (ref 37–153)
BUN/Creatinine Ratio: 23 (calc) — ABNORMAL HIGH (ref 6–22)
BUN: 28 mg/dL — ABNORMAL HIGH (ref 7–25)
CO2: 26 mmol/L (ref 20–32)
Calcium: 9.5 mg/dL (ref 8.6–10.4)
Chloride: 101 mmol/L (ref 98–110)
Creat: 1.2 mg/dL — ABNORMAL HIGH (ref 0.60–1.00)
Globulin: 2.5 g/dL (calc) (ref 1.9–3.7)
Glucose, Bld: 195 mg/dL — ABNORMAL HIGH (ref 65–99)
Potassium: 4.9 mmol/L (ref 3.5–5.3)
Sodium: 137 mmol/L (ref 135–146)
Total Bilirubin: 1.1 mg/dL (ref 0.2–1.2)
Total Protein: 6.9 g/dL (ref 6.1–8.1)
eGFR: 46 mL/min/{1.73_m2} — ABNORMAL LOW (ref >=60)

## 2021-04-30 NOTE — Telephone Encounter (Signed)
Signed provider portion received.   Submitted Patient Assistance RENEWAL Application to [employer] for ENBREL along with provider portion, patient portion, med list, insurance card copy.  Will update patient when we receive a response.   Fax# [phone] Phone# [phone]  Knox Saliva, PharmD, MPH, BCPS Clinical Pharmacist (Rheumatology and Pulmonology)

## 2021-04-30 NOTE — Progress Notes (Signed)
Glucose is elevated.  Creatinine is elevated and stable.

## 2021-05-05 ENCOUNTER — Ambulatory Visit (INDEPENDENT_AMBULATORY_CARE_PROVIDER_SITE_OTHER): Payer: Medicare Other | Admitting: Nurse Practitioner

## 2021-05-05 ENCOUNTER — Other Ambulatory Visit: Payer: Self-pay

## 2021-05-05 ENCOUNTER — Telehealth: Payer: Self-pay

## 2021-05-05 ENCOUNTER — Encounter: Payer: Self-pay | Admitting: Nurse Practitioner

## 2021-05-05 DIAGNOSIS — Z0000 Encounter for general adult medical examination without abnormal findings: Secondary | ICD-10-CM

## 2021-05-05 NOTE — Progress Notes (Signed)
This service is provided via telemedicine  No vital signs collected/recorded due to the encounter was a telemedicine visit.   Location of patient (ex: home, work):  Home  Patient consents to a telephone visit:  Yes, see encounter dated 05/05/2021  Location of the provider (ex: office, home):  [place]  Name of any referring provider:  Windell Moulding, NP  Names of all persons participating in the telemedicine service and their role in the encounter:  Sherrie Mustache, Nurse Practitioner, Carroll Kinds, CMA, and patient.   Time spent on call:  8 minutes with medical assistant

## 2021-05-05 NOTE — Telephone Encounter (Signed)
Ms. lenia, housley are scheduled for a virtual visit with your provider today.    Just as we do with appointments in the office, we must obtain your consent to participate.  Your consent will be active for this visit and any virtual visit you may have with one of our providers in the next 365 days.    If you have a MyChart account, I can also send a copy of this consent to you electronically.  All virtual visits are billed to your insurance company just like a traditional visit in the office.  As this is a virtual visit, video technology does not allow for your provider to perform a traditional examination.  This may limit your provider's ability to fully assess your condition.  If your provider identifies any concerns that need to be evaluated in person or the need to arrange testing such as labs, EKG, etc, we will make arrangements to do so.    Although advances in technology are sophisticated, we cannot ensure that it will always work on either your end or our end.  If the connection with a video visit is poor, we may have to switch to a telephone visit.  With either a video or telephone visit, we are not always able to ensure that we have a secure connection.   I need to obtain your verbal consent now.   Are you willing to proceed with your visit today?   TENEE WISH has provided verbal consent on 05/05/2021 for a virtual visit (video or telephone).   Carroll Kinds, [facility] 05/05/2021  8:47 AM

## 2021-05-05 NOTE — Patient Instructions (Signed)
[name] , Thank you for taking time to come for your Medicare Wellness Visit. I appreciate your ongoing commitment to your health goals. Please review the following plan we discussed and let me know if I can assist you in the future.   Screening recommendations/referrals: Colonoscopy aged out Mammogram aged out Bone Density declined Recommended yearly ophthalmology/optometry visit for glaucoma screening and checkup Recommended yearly dental visit for hygiene and checkup  Vaccinations: Influenza vaccine up to date Pneumococcal vaccine up to date Tdap vaccine up to date Shingles vaccine RECOMMENDED - to get at local pharmacy COVID booster- recommended at local pharmacy     Advanced directives: recommended to complete   Conditions/risks identified: advance age, hypertension, diabetes, hyperlipidemia  Next appointment: yearly    Preventive Care 48 Years and Older, Female Preventive care refers to lifestyle choices and visits with your health care provider that can promote health and wellness. What does preventive care include? A yearly physical exam. This is also called an annual well check. Dental exams once or twice a year. Routine eye exams. Ask your health care provider how often you should have your eyes checked. Personal lifestyle choices, including: Daily care of your teeth and gums. Regular physical activity. Eating a healthy diet. Avoiding tobacco and drug use. Limiting alcohol use. Practicing safe sex. Taking low-dose aspirin every day. Taking vitamin and mineral supplements as recommended by your health care provider. What happens during an annual well check? The services and screenings done by your health care provider during your annual well check will depend on your age, overall health, lifestyle risk factors, and family history of disease. Counseling  Your health care provider may ask you questions about your: Alcohol use. Tobacco use. Drug use. Emotional  well-being. Home and relationship well-being. Sexual activity. Eating habits. History of falls. Memory and ability to understand (cognition). Work and work Statistician. Reproductive health. Screening  You may have the following tests or measurements: Height, weight, and BMI. Blood pressure. Lipid and cholesterol levels. These may be checked every 5 years, or more frequently if you are over 37 years old. Skin check. Lung cancer screening. You may have this screening every year starting at age 26 if you have a 30-pack-year history of smoking and currently smoke or have quit within the past 15 years. Fecal occult blood test (FOBT) of the stool. You may have this test every year starting at age 17. Flexible sigmoidoscopy or colonoscopy. You may have a sigmoidoscopy every 5 years or a colonoscopy every 10 years starting at age 52. Hepatitis C blood test. Hepatitis B blood test. Sexually transmitted disease (STD) testing. Diabetes screening. This is done by checking your blood sugar (glucose) after you have not eaten for a while (fasting). You may have this done every 1-3 years. Bone density scan. This is done to screen for osteoporosis. You may have this done starting at age 32. Mammogram. This may be done every 1-2 years. Talk to your health care provider about how often you should have regular mammograms. Talk with your health care provider about your test results, treatment options, and if necessary, the need for more tests. Vaccines  Your health care provider may recommend certain vaccines, such as: Influenza vaccine. This is recommended every year. Tetanus, diphtheria, and acellular pertussis (Tdap, Td) vaccine. You may need a Td booster every 10 years. Zoster vaccine. You may need this after age 68. Pneumococcal 13-valent conjugate (PCV13) vaccine. One dose is recommended after age 42. Pneumococcal polysaccharide (PPSV23) vaccine. One  dose is recommended after age 47. Talk to your  health care provider about which screenings and vaccines you need and how often you need them. This information is not intended to replace advice given to you by your health care provider. Make sure you discuss any questions you have with your health care provider. Document Released: 07/18/2015 Document Revised: 03/10/2016 Document Reviewed: 04/22/2015 Elsevier Interactive Patient Education  2017 [place] Prevention in the Home Falls can cause injuries. They can happen to people of all ages. There are many things you can do to make your home safe and to help prevent falls. What can I do on the outside of my home? Regularly fix the edges of walkways and driveways and fix any cracks. Remove anything that might make you trip as you walk through a door, such as a raised step or threshold. Trim any bushes or trees on the path to your home. Use bright outdoor lighting. Clear any walking paths of anything that might make someone trip, such as rocks or tools. Regularly check to see if handrails are loose or broken. Make sure that both sides of any steps have handrails. Any raised decks and porches should have guardrails on the edges. Have any leaves, snow, or ice cleared regularly. Use sand or salt on walking paths during winter. Clean up any spills in your garage right away. This includes oil or grease spills. What can I do in the bathroom? Use night lights. Install grab bars by the toilet and in the tub and shower. Do not use towel bars as grab bars. Use non-skid mats or decals in the tub or shower. If you need to sit down in the shower, use a plastic, non-slip stool. Keep the floor dry. Clean up any water that spills on the floor as soon as it happens. Remove soap buildup in the tub or shower regularly. Attach bath mats securely with double-sided non-slip rug tape. Do not have throw rugs and other things on the floor that can make you trip. What can I do in the bedroom? Use night  lights. Make sure that you have a light by your bed that is easy to reach. Do not use any sheets or blankets that are too big for your bed. They should not hang down onto the floor. Have a firm chair that has side arms. You can use this for support while you get dressed. Do not have throw rugs and other things on the floor that can make you trip. What can I do in the kitchen? Clean up any spills right away. Avoid walking on wet floors. Keep items that you use a lot in easy-to-reach places. If you need to reach something above you, use a strong step stool that has a grab bar. Keep electrical cords out of the way. Do not use floor polish or wax that makes floors slippery. If you must use wax, use non-skid floor wax. Do not have throw rugs and other things on the floor that can make you trip. What can I do with my stairs? Do not leave any items on the stairs. Make sure that there are handrails on both sides of the stairs and use them. Fix handrails that are broken or loose. Make sure that handrails are as long as the stairways. Check any carpeting to make sure that it is firmly attached to the stairs. Fix any carpet that is loose or worn. Avoid having throw rugs at the top or bottom of the  stairs. If you do have throw rugs, attach them to the floor with carpet tape. Make sure that you have a light switch at the top of the stairs and the bottom of the stairs. If you do not have them, ask someone to add them for you. What else can I do to help prevent falls? Wear shoes that: Do not have high heels. Have rubber bottoms. Are comfortable and fit you well. Are closed at the toe. Do not wear sandals. If you use a stepladder: Make sure that it is fully opened. Do not climb a closed stepladder. Make sure that both sides of the stepladder are locked into place. Ask someone to hold it for you, if possible. Clearly mark and make sure that you can see: Any grab bars or handrails. First and last  steps. Where the edge of each step is. Use tools that help you move around (mobility aids) if they are needed. These include: Canes. Walkers. Scooters. Crutches. Turn on the lights when you go into a dark area. Replace any light bulbs as soon as they burn out. Set up your furniture so you have a clear path. Avoid moving your furniture around. If any of your floors are uneven, fix them. If there are any pets around you, be aware of where they are. Review your medicines with your doctor. Some medicines can make you feel dizzy. This can increase your chance of falling. Ask your doctor what other things that you can do to help prevent falls. This information is not intended to replace advice given to you by your health care provider. Make sure you discuss any questions you have with your health care provider. Document Released: 04/17/2009 Document Revised: 11/27/2015 Document Reviewed: 07/26/2014 Elsevier Interactive Patient Education  2017 [employer].

## 2021-05-05 NOTE — Progress Notes (Signed)
Subjective:   Annette Suarez is a 80 y.o. female who presents for Medicare Annual (Subsequent) preventive examination.  Review of Systems     Cardiac Risk Factors include: advanced age (>32men, >68 women);sedentary lifestyle;hypertension;dyslipidemia;diabetes mellitus     Objective:    There were no vitals filed for this visit. There is no height or weight on file to calculate BMI.  Advanced Directives 05/05/2021 03/05/2021 02/11/2021 11/27/2020 08/26/2020 08/18/2020 06/24/2020  Does Patient Have a Medical Advance Directive? No No No No Yes No Yes  Type of Advance Directive - - - - - - -  Does patient want to make changes to medical advance directive? - - - - No - Patient declined - Yes (ED - Information included in AVS)  Copy of [place] in Chart? - - - - - - -  Would patient like information on creating a medical advance directive? - No - Patient declined No - Patient declined No - Patient declined - - -    Current Medications (verified) Outpatient Encounter Medications as of 05/05/2021  Medication Sig   Accu-Chek Softclix Lancets lancets Use to test blood sugar three times daily. Dx: E11.8   aspirin EC 81 MG tablet Take 81 mg by mouth daily.   chlorthalidone (HYGROTON) 25 MG tablet Take 1 tablet (25 mg total) by mouth daily.   Continuous Blood Gluc Sensor (FREESTYLE LIBRE 2 SENSOR) MISC 1 application by Does not apply route every 14 (fourteen) days.   CRANBERRY PO Take by mouth daily.   Diclofenac Sodium (VOLTAREN EX) Apply 1 application topically 3 (three) times daily as needed.   estradiol (ESTRACE) 0.1 MG/GM vaginal cream Place 1 Applicatorful vaginally 3 (three) times a week.   etanercept (ENBREL SURECLICK) 50 MG/ML injection Inject 50 mg into the skin once a week.   fluocinonide (LIDEX) 0.05 % external solution Apply 1 application topically daily as needed.   glucose blood (ACCU-CHEK GUIDE) test strip Use to check blood sugar three times daily Dx: E11.8    insulin aspart protamine- aspart (NOVOLOG MIX 70/30) (70-30) 100 UNIT/ML injection Inject 40 Units into the skin in the morning. Increase by 2 units every 3 days until sugars are < 150   insulin aspart protamine- aspart (NOVOLOG MIX 70/30) (70-30) 100 UNIT/ML injection Inject 0.38 mLs (38 Units total) into the skin daily with supper.   irbesartan (AVAPRO) 300 MG tablet Take 1 tablet (300 mg total) by mouth daily.   loperamide (IMODIUM A-D) 2 MG tablet Take 2 mg by mouth as needed for diarrhea or loose stools.   LORazepam (ATIVAN) 0.5 MG tablet Take 1 tablet (0.5 mg total) by mouth at bedtime.   MELATONIN PO Take 10 mg by mouth at bedtime as needed.   Omeprazole 20 MG TBDD Take 20 mg by mouth daily.   ondansetron (ZOFRAN) 4 MG tablet Take 4 mg by mouth 3 (three) times daily as needed for nausea or vomiting.   Probiotic Product (PROBIOTIC PO) Take by mouth daily.   simvastatin (ZOCOR) 20 MG tablet TAKE ONE TABLET BY MOUTH EVERY EVENING   spironolactone (ALDACTONE) 25 MG tablet Take 1 tablet (25 mg total) by mouth daily.   phenazopyridine (PYRIDIUM) 200 MG tablet Take 200 mg by mouth 3 (three) times daily. (Patient not taking: No sig reported)   No facility-administered encounter medications on file as of 05/05/2021.    Allergies (verified) Amaryl [glimepiride], Amlodipine, Codeine, Jardiance [empagliflozin], Pneumovax 23 [pneumococcal vac polyvalent], Wellbutrin [bupropion], and Latex  History: Past Medical History:  Diagnosis Date   Acute cystitis    UTI   Anxiety    Arthritis    Benign positional vertigo    Dermatitis    Diabetes mellitus without complication ([place])    Family history of adverse reaction to anesthesia    GERD (gastroesophageal reflux disease)    Goiter, nontoxic, multinodular    Gout    H/O: hysterectomy    History of tobacco use    Hx laparoscopic cholecystectomy    Hypercholesterolemia    Hypertension    Incomplete bladder emptying    Insomnia     Postmenopausal atrophic vaginitis    RA (rheumatoid arthritis) ([place])    Rectocele    Renal calculus    Sleep apnea    Thyroid condition    Type 2 diabetes mellitus ([place])    Urolithiasis    Vaginal enterocele    Past Surgical History:  Procedure Laterality Date   CARPAL TUNNEL RELEASE  2018   CATARACT EXTRACTION, BILATERAL     CHOLECYSTECTOMY     COLONOSCOPY     07/11/2009, 02/2015   GALLBLADDER SURGERY     KNEE ARTHROSCOPY Right 05/20/2020   Procedure: RIGHT KNEE ARTHROSCOPY AND DEBRIDEMENT;  Surgeon: [name], MD;  Location: [facility];  Service: Orthopedics;  Laterality: Right;   PARTIAL HYSTERECTOMY     [place] left intact   SQUAMOUS CELL CARCINOMA EXCISION  05/2018   Family History  Problem Relation Age of Onset   Diabetes Mother    Heart disease Father 32   Diabetes Son    Crohn's disease Son    Social History   Socioeconomic History   Marital status: Married    Spouse name: Not on file   Number of children: Not on file   Years of education: Not on file   Highest education level: Not on file  Occupational History   Not on file  Tobacco Use   Smoking status: Former    Packs/day: 0.20    Years: 10.00    Pack years: 2.00    Types: Cigarettes    Quit date: 07/05/1978    Years since quitting: 42.8   Smokeless tobacco: Never  Vaping Use   Vaping Use: Never used  Substance and Sexual Activity   Alcohol use: No   Drug use: No   Sexual activity: Not Currently  Other Topics Concern   Not on file  Social History Narrative   Diet: Blank      Do you drink/ eat things with caffeine? Yes      Marital status:  Married                             What year were you married ? 196      Do you live in a house, apartment,assistred living, condo, trailer, etc.)? House      Is it one or more stories? 1      How many persons live in your home ? 2      Do you have any pets in your home ?(please list)  1 Sam-Dog      Highest Level of education  completed: 12      Current or past profession: Office      Do you exercise?   Some  Type & how often  Walk Daily      ADVANCED DIRECTIVES (Please bring copies)      Do you have a living will? Blank      Do you have a DNR form?  Blank                     If not, do you want to discuss one?       Do you have signed POA?HPOA forms?  Yes               If so, please bring to your appointment      FUNCTIONAL STATUS- To be completed by Spouse / child / Staff       Do you have difficulty bathing or dressing yourself ?  No      Do you have difficulty preparing food or eating ?  No      Do you have difficulty managing your mediation ?  No      Do you have difficulty managing your finances ?  No      Do you have difficulty affording your medication ?  Yes (Some)      Social Determinants of Health   Financial Resource Strain: Not on file  Food Insecurity: Not on file  Transportation Needs: Not on file  Physical Activity: Not on file  Stress: Not on file  Social Connections: Not on file    Tobacco Counseling Counseling given: Not Answered   Clinical Intake:  Pre-visit preparation completed: Yes  Pain : No/denies pain     BMI - recorded: 29 Diabetes: Yes  How often do you need to have someone help you when you read instructions, pamphlets, or other written materials from your doctor or pharmacy?: 3 - Sometimes  Diabetic?yes  Interpreter Needed?: No      Activities of Daily Living In your present state of health, do you have any difficulty performing the following activities: 05/05/2021 05/20/2020  Hearing? N N  Vision? N N  Difficulty concentrating or making decisions? Y N  Walking or climbing stairs? N Y  Dressing or bathing? N N  Doing errands, shopping? Y -  Comment husband helps -  Conservation officer, nature and eating ? N -  Using the Toilet? N -  In the past six months, have you accidently leaked urine? Y -  Do you have problems with loss  of bowel control? Y -  Managing your Medications? N -  Managing your Finances? N -  Housekeeping or managing your Housekeeping? N -  Some recent data might be hidden    Patient Care Team: [name], NP as PCP - General (Adult Health Nurse Practitioner)  Indicate any recent Medical Services you may have received from other than [facility] providers in the past year (date may be approximate).     Assessment:   This is a routine wellness examination for Jacia.  Hearing/Vision screen Hearing Screening - Comments:: No problems with hearing Vision Screening - Comments:: No vision problems. Patient is scheduled for yearly eye exam. Patient sees Dr. Sabra Heck for eyes.  Dietary issues and exercise activities discussed: Current Exercise Habits: The patient does not participate in regular exercise at present   Goals Addressed   None    Depression Screen PHQ 2/9 Scores 05/05/2021 06/24/2020  PHQ - 2 Score 0 0    Fall Risk Fall Risk  05/05/2021 04/09/2021 03/05/2021 02/11/2021 01/23/2021  Falls in the past year? 0 0 0 0  0  Number falls in past yr: 0 0 0 0 0  Injury with Fall? 0 0 0 0 0  Risk for fall due to : No Fall Risks No Fall Risks No Fall Risks No Fall Risks No Fall Risks  Follow up Falls evaluation completed Falls evaluation completed;Education provided;Falls prevention discussed Falls evaluation completed Falls evaluation completed -    FALL RISK PREVENTION PERTAINING TO THE HOME:  Any stairs in or around the home? Yes  If so, are there any without handrails? No  Home free of loose throw rugs in walkways, pet beds, electrical cords, etc? Yes  Adequate lighting in your home to reduce risk of falls? Yes   ASSISTIVE DEVICES UTILIZED TO PREVENT FALLS:  Life alert? No  Use of a cane, walker or w/c? Yes  Grab bars in the bathroom? Yes  Shower chair or bench in shower? Yes  Elevated toilet seat or a handicapped toilet? No   TIMED UP AND GO:  Was the test performed? No .   Cognitive  Function: MMSE - Mini Mental State Exam 06/24/2020  Orientation to time 5  Orientation to Place 5  Registration 3  Attention/ Calculation 5  Recall 3  Language- name 2 objects 2  Language- repeat 1  Language- follow 3 step command 3  Language- read & follow direction 1  Write a sentence 1  Copy design 0  Copy design-comments passed clock test  Total score 29     6CIT Screen 05/05/2021  What Year? 0 points  What month? 0 points  What time? 0 points  Count back from 20 0 points  Months in reverse 0 points  Repeat phrase 2 points  Total Score 2    Immunizations Immunization History  Administered Date(s) Administered   Fluad Quad(high Dose 65+) 04/09/2021   Influenza Split 03/28/2008, 03/05/2010, 04/17/2012, 04/20/2013   Influenza, High Dose Seasonal PF 02/08/2020   Influenza-Unspecified 05/11/2011, 04/22/2014, 04/15/2015, 02/20/2016, 03/02/2017, 03/09/2018, 03/20/2019, 03/21/2020   PFIZER(Purple Top)SARS-COV-2 Vaccination 07/24/2019, 08/06/2019, 08/27/2019, 04/21/2020   Pneumococcal Conjugate-13 05/02/2014   Pneumococcal Polysaccharide-23 04/23/2008   Tdap 05/01/2013   Zoster, Live 04/17/2012    TDAP status: Up to date  Flu Vaccine status: Up to date  Pneumococcal vaccine status: Up to date  Covid-19 vaccine status: Information provided on how to obtain vaccines.   Qualifies for Shingles Vaccine? Yes   Zostavax completed Yes   Shingrix Completed?: No.    Education has been provided regarding the importance of this vaccine. Patient has been advised to call insurance company to determine out of pocket expense if they have not yet received this vaccine. Advised may also receive vaccine at local pharmacy or Health Dept. Verbalized acceptance and understanding.  Screening Tests Health Maintenance  Topic Date Due   COVID-19 Vaccine (5 - Booster) 06/16/2020   Zoster Vaccines- Shingrix (1 of 2) 07/10/2021 (Originally 02/11/1960)   FOOT EXAM  06/03/2021   OPHTHALMOLOGY  EXAM  06/04/2021   HEMOGLOBIN A1C  10/08/2021   TETANUS/TDAP  05/02/2023   Pneumonia Vaccine 16+ Years old  Completed   INFLUENZA VACCINE  Completed   DEXA SCAN  Completed   Hepatitis C Screening  Completed   HPV VACCINES  Aged Out    Health Maintenance  Health Maintenance Due  Topic Date Due   COVID-19 Vaccine (5 - Booster) 06/16/2020    Colorectal cancer screening: No longer required.   Mammogram status: No longer required due to aged out.  Decline bone density  Lung Cancer Screening: (Low Dose CT Chest recommended if Age 32-80 years, 30 pack-year currently smoking OR have quit w/in 15years.) does not qualify.   Lung Cancer Screening Referral: na  Additional Screening:  Hepatitis C Screening: does qualify; Completed   Vision Screening: Recommended annual ophthalmology exams for early detection of glaucoma and other disorders of the eye. Is the patient up to date with their annual eye exam?  Yes  Who is the provider or what is the name of the office in which the patient attends annual eye exams? [facility] If pt is not established with a provider, would they like to be referred to a provider to establish care? No .   Dental Screening: Recommended annual dental exams for proper oral hygiene  Community Resource Referral / Chronic Care Management: CRR required this visit?  No   CCM required this visit?  No      Plan:     I have personally reviewed and noted the following in the patient's chart:   Medical and social history Use of alcohol, tobacco or illicit drugs  Current medications and supplements including opioid prescriptions.  Functional ability and status Nutritional status Physical activity Advanced directives List of other physicians Hospitalizations, surgeries, and ER visits in previous 12 months Vitals Screenings to include cognitive, depression, and falls Referrals and appointments  In addition, I have reviewed and discussed with patient certain  preventive protocols, quality metrics, and best practice recommendations. A written personalized care plan for preventive services as well as general preventive health recommendations were provided to patient.     Lauree Chandler, NP   05/05/2021    Virtual Visit via Telephone Note  I connected withNAME@ on 05/05/21 at  8:30 AM EDT by telephone and verified that I am speaking with the correct person using two identifiers.  Location: Patient: home Provider: [facility]    I discussed the limitations, risks, security and privacy concerns of performing an evaluation and management service by telephone and the availability of in person appointments. I also discussed with the patient that there may be a patient responsible charge related to this service. The patient expressed understanding and agreed to proceed.   I discussed the assessment and treatment plan with the patient. The patient was provided an opportunity to ask questions and all were answered. The patient agreed with the plan and demonstrated an understanding of the instructions.   The patient was advised to call back or seek an in-person evaluation if the symptoms worsen or if the condition fails to improve as anticipated.  I provided 15 minutes of non-face-to-face time during this encounter.  Carlos [employer]. Harle Battiest [employer] printed and mailed

## 2021-05-06 NOTE — Telephone Encounter (Signed)
Received a fax from  [place] regarding an approval for ENBREL patient assistance from 05/05/21 to 07/04/22.   Phone number: [phone]  Knox Saliva, PharmD, MPH, BCPS Clinical Pharmacist (Rheumatology and Pulmonology)

## 2021-05-11 ENCOUNTER — Ambulatory Visit: Payer: Medicare Other | Admitting: Endocrinology

## 2021-05-11 ENCOUNTER — Encounter: Payer: Self-pay | Admitting: Endocrinology

## 2021-05-11 ENCOUNTER — Other Ambulatory Visit: Payer: Self-pay

## 2021-05-11 VITALS — BP 140/50 | HR 97 | Ht 62.0 in | Wt 163.2 lb

## 2021-05-11 DIAGNOSIS — E118 Type 2 diabetes mellitus with unspecified complications: Secondary | ICD-10-CM

## 2021-05-11 DIAGNOSIS — Z794 Long term (current) use of insulin: Secondary | ICD-10-CM

## 2021-05-11 LAB — POCT GLYCOSYLATED HEMOGLOBIN (HGB A1C): Hemoglobin A1C: 8.1 % — AB (ref 4.0–5.6)

## 2021-05-11 MED ORDER — INSULIN NPH (HUMAN) (ISOPHANE) 100 UNIT/ML ~~LOC~~ SUSP: SUBCUTANEOUS | 11 refills | Status: DC

## 2021-05-11 MED ORDER — TRULICITY 0.75 MG/0.5ML ~~LOC~~ SOAJ: 0.7500 mg | SUBCUTANEOUS | 3 refills | Status: DC

## 2021-05-11 NOTE — Patient Instructions (Addendum)
good diet and exercise significantly improve the control of your diabetes.  please let me know if you wish to be referred to a dietician.  high blood sugar is very risky to your health.  you should see an eye doctor and dentist every year.  It is very important to get all recommended vaccinations.  Controlling your blood pressure and cholesterol drastically reduces the damage diabetes does to your body.  Those who smoke should quit.  Please discuss these with your doctor.  check your blood sugar twice a day.  vary the time of day when you check, between before the 3 meals, and at bedtime.  also check if you have symptoms of your blood sugar being too high or too low.  please keep a record of the readings and bring it to your next appointment here (or you can bring the meter itself).  You can write it on any piece of paper.  please call us sooner if your blood sugar goes below 70, or if most of your readings are over 200. We will need to take this complex situation in stages I have sent a prescription to your pharmacy, to add Trulicity, and Reduce the insulin to 30 units with breakfast, and 20 units with supper. Please come back for a follow-up appointment in 1 month.

## 2021-05-11 NOTE — Progress Notes (Signed)
Subjective:    Patient ID: Annette Suarez, female    DOB: June 18, 1941, 80 y.o.   MRN: [id number]  HPI pt is referred by Windell Moulding, NP, for diabetes.  Pt states DM was dx'ed in 5638; it is complicated by PAD and CRI; she has been on insulin since 2017; pt says her diet and exercise are good; she has never had GDM, pancreatitis, pancreatic surgery, severe hypoglycemia or DKA.  She takes Novolin 70/30, 40 units qam, and 37-45 units qpm.  She did not tolerate Jardiance (UTI's) or metformin (nausea).  She seldom has hypoglycemia, and these episodes are mild.  Meter is downloaded today, and the printout is scanned into the record.  Cbg varies from 100-370.  There is no trend throughout the day.   Past Medical History:  Diagnosis Date   Acute cystitis    UTI   Anxiety    Arthritis    Benign positional vertigo    Dermatitis    Diabetes mellitus without complication ([place])    Family history of adverse reaction to anesthesia    GERD (gastroesophageal reflux disease)    Goiter, nontoxic, multinodular    Gout    H/O: hysterectomy    History of tobacco use    Hx laparoscopic cholecystectomy    Hypercholesterolemia    Hypertension    Incomplete bladder emptying    Insomnia    Postmenopausal atrophic vaginitis    RA (rheumatoid arthritis) ([place])    Rectocele    Renal calculus    Sleep apnea    Thyroid condition    Type 2 diabetes mellitus ([place])    Urolithiasis    Vaginal enterocele     Past Surgical History:  Procedure Laterality Date   CARPAL TUNNEL RELEASE  2018   CATARACT EXTRACTION, BILATERAL     CHOLECYSTECTOMY     COLONOSCOPY     07/11/2009, 02/2015   GALLBLADDER SURGERY     KNEE ARTHROSCOPY Right 05/20/2020   Procedure: RIGHT KNEE ARTHROSCOPY AND DEBRIDEMENT;  Surgeon: [name], MD;  Location: [facility];  Service: Orthopedics;  Laterality: Right;   PARTIAL HYSTERECTOMY     Overies left intact   SQUAMOUS CELL CARCINOMA EXCISION  05/2018    Social History    Socioeconomic History   Marital status: Married    Spouse name: Not on file   Number of children: Not on file   Years of education: Not on file   Highest education level: Not on file  Occupational History   Not on file  Tobacco Use   Smoking status: Former    Packs/day: 0.20    Years: 10.00    Pack years: 2.00    Types: Cigarettes    Quit date: 07/05/1978    Years since quitting: 42.8   Smokeless tobacco: Never  Vaping Use   Vaping Use: Never used  Substance and Sexual Activity   Alcohol use: No   Drug use: No   Sexual activity: Not Currently  Other Topics Concern   Not on file  Social History Narrative   Diet: Blank      Do you drink/ eat things with caffeine? Yes      Marital status:  Married                             What year were you married ? 196      Do you live in a house, apartment,assistred  living, condo, trailer, etc.)? House      Is it one or more stories? 1      How many persons live in your home ? 2      Do you have any pets in your home ?(please list)  1 Sam-Dog      Highest Level of education completed: 12      Current or past profession: Office      Do you exercise?   Some                           Type & how often  Walk Daily      ADVANCED DIRECTIVES (Please bring copies)      Do you have a living will? Blank      Do you have a DNR form?  Blank                     If not, do you want to discuss one?       Do you have signed POA?HPOA forms?  Yes               If so, please bring to your appointment      FUNCTIONAL STATUS- To be completed by Spouse / child / Staff       Do you have difficulty bathing or dressing yourself ?  No      Do you have difficulty preparing food or eating ?  No      Do you have difficulty managing your mediation ?  No      Do you have difficulty managing your finances ?  No      Do you have difficulty affording your medication ?  Yes (Some)      Social Determinants of Health   Financial Resource Strain:  Not on file  Food Insecurity: Not on file  Transportation Needs: Not on file  Physical Activity: Not on file  Stress: Not on file  Social Connections: Not on file  Intimate Partner Violence: Not on file    Current Outpatient Medications on File Prior to Visit  Medication Sig Dispense Refill   Accu-Chek Softclix Lancets lancets Use to test blood sugar three times daily. Dx: E11.8 300 each 1   aspirin EC 81 MG tablet Take 81 mg by mouth daily.     chlorthalidone (HYGROTON) 25 MG tablet Take 1 tablet (25 mg total) by mouth daily. 90 tablet 3   Continuous Blood Gluc Sensor (FREESTYLE LIBRE 2 SENSOR) MISC 1 application by Does not apply route every 14 (fourteen) days. 2 each 3   CRANBERRY PO Take by mouth daily.     Diclofenac Sodium (VOLTAREN EX) Apply 1 application topically 3 (three) times daily as needed.     estradiol (ESTRACE) 0.1 MG/GM vaginal cream Place 1 Applicatorful vaginally 3 (three) times a week. 42.5 g 0   etanercept (ENBREL SURECLICK) 50 MG/ML injection Inject 50 mg into the skin once a week. 12 mL 0   fluocinonide (LIDEX) 0.05 % external solution Apply 1 application topically daily as needed.     glucose blood (ACCU-CHEK GUIDE) test strip Use to check blood sugar three times daily Dx: E11.8 300 strip 1   irbesartan (AVAPRO) 300 MG tablet Take 1 tablet (300 mg total) by mouth daily. 90 tablet 3   loperamide (IMODIUM A-D) 2 MG tablet Take 2 mg by mouth as needed for diarrhea or loose stools.  LORazepam (ATIVAN) 0.5 MG tablet Take 1 tablet (0.5 mg total) by mouth at bedtime. 30 tablet 3   MELATONIN PO Take 10 mg by mouth at bedtime as needed.     Omeprazole 20 MG TBDD Take 20 mg by mouth daily.     ondansetron (ZOFRAN) 4 MG tablet Take 4 mg by mouth 3 (three) times daily as needed for nausea or vomiting.     phenazopyridine (PYRIDIUM) 200 MG tablet Take 200 mg by mouth 3 (three) times daily.     Probiotic Product (PROBIOTIC PO) Take by mouth daily.     simvastatin (ZOCOR) 20  MG tablet TAKE ONE TABLET BY MOUTH EVERY EVENING 90 tablet 1   spironolactone (ALDACTONE) 25 MG tablet Take 1 tablet (25 mg total) by mouth daily. 90 tablet 3   No current facility-administered medications on file prior to visit.    Allergies  Allergen Reactions   Amaryl [Glimepiride]     Don't Remember   Amlodipine     LE edema on 7.5mg  daily. Tolerates 5mg  daily just fine   Codeine     SICK   Jardiance [Empagliflozin]     Urinary tract infection- SIDE EFFECT   Pneumovax 23 [Pneumococcal Vac Polyvalent]     Red / Swelling / Hot to touch SIDE EFFECTS   Wellbutrin [Bupropion]     Unknown   Latex Rash    Family History  Problem Relation Age of Onset   Diabetes Mother    Heart disease Father 17   Diabetes Son    Crohn's disease Son     BP (!) 140/50 (BP Location: Right Arm, Patient Position: Sitting, Cuff Size: Large)   Pulse 97   Ht 5\' 2"  (1.575 m)   Wt 163 lb 3.2 oz (74 kg)   SpO2 98%   BMI 29.85 kg/m   Review of Systems denies weight loss, sob, n/v, memory loss.       Objective:   Physical Exam Pulses: dorsalis pedis intact bilat.   MSK: no deformity of the feet CV: no leg edema Skin:  no ulcer on the feet.  normal color and temp on the feet. Neuro: sensation is intact to touch on the feet  Lab Results  Component Value Date   TSH 1.81 04/09/2021    Lab Results  Component Value Date   CREATININE 1.20 (H) 04/29/2021   BUN 28 (H) 04/29/2021   NA 137 04/29/2021   K 4.9 04/29/2021   CL 101 04/29/2021   CO2 26 04/29/2021    Lab Results  Component Value Date   HGBA1C 8.1 (A) 05/11/2021   I have reviewed outside records, and summarized: Pt was noted to have elevated A1c, and referred here.  She has h/o MNG.  Radiol said no f/u was needed.      Assessment & Plan:  Insulin-requiring type 2 DM: uncontrolled  Patient Instructions  good diet and exercise significantly improve the control of your diabetes.  please let me know if you wish to be referred  to a dietician.  high blood sugar is very risky to your health.  you should see an eye doctor and dentist every year.  It is very important to get all recommended vaccinations.  Controlling your blood pressure and cholesterol drastically reduces the damage diabetes does to your body.  Those who smoke should quit.  Please discuss these with your doctor.  check your blood sugar twice a day.  vary the time of day when you check, between  before the 3 meals, and at bedtime.  also check if you have symptoms of your blood sugar being too high or too low.  please keep a record of the readings and bring it to your next appointment here (or you can bring the meter itself).  You can write it on any piece of paper.  please call us sooner if your blood sugar goes below 70, or if most of your readings are over 200. We will need to take this complex situation in stages I have sent a prescription to your pharmacy, to add Trulicity, and Reduce the insulin to 30 units with breakfast, and 20 units with supper. Please come back for a follow-up appointment in 1 month.

## 2021-05-13 ENCOUNTER — Telehealth: Payer: Self-pay | Admitting: Endocrinology

## 2021-05-13 NOTE — Telephone Encounter (Signed)
please cancel the 06/10/21 f/u ov, as pt wants to f/u with Dr Kelton Pillar.  Please advise pt of the cancellation.

## 2021-05-15 ENCOUNTER — Telehealth: Payer: Self-pay | Admitting: Pharmacy Technician

## 2021-05-15 ENCOUNTER — Other Ambulatory Visit: Payer: Self-pay | Admitting: Endocrinology

## 2021-05-15 ENCOUNTER — Other Ambulatory Visit (HOSPITAL_COMMUNITY): Payer: Self-pay

## 2021-05-15 MED ORDER — INSULIN NPH (HUMAN) (ISOPHANE) 100 UNIT/ML ~~LOC~~ SUSP: SUBCUTANEOUS | 0 refills | Status: DC

## 2021-05-15 NOTE — Telephone Encounter (Signed)
Patient Advocate Encounter   Received notification from CoverMyMeds that prior authorization for Novolin N is required by his/her insurance [employer]  Per Test Claim: Humulin N is preferred

## 2021-05-18 ENCOUNTER — Other Ambulatory Visit: Payer: Self-pay | Admitting: Orthopedic Surgery

## 2021-05-18 ENCOUNTER — Ambulatory Visit: Payer: Medicare Other | Admitting: Internal Medicine

## 2021-05-18 NOTE — Telephone Encounter (Signed)
Spoke with pt regarding the medication change to Humulin N

## 2021-05-18 NOTE — Progress Notes (Deleted)
Cardiology Office Note:    Date:  05/18/2021   ID:  Annette Suarez, DOB August 02, 1940, MRN [id number]  PCP:  Annette Alanis, NP  [facility] Cardiologist:  Rudean Haskell MD [place] Electrophysiologist:  None   CC: Follow up Subclavian Stenosis  History of Present Illness:    Annette Suarez is a 80 y.o. female with a hx of Diabetes and HTN, HLD, OSA not on CPAP who presents for evaluation 06/11/20.  In interim, had CT with new diagnosis of subclavian stenosis and aortic atherosclerosis.  Seen in 3/22, then saw Dr. Fletcher Anon: no  left arm claudication.  No symptoms of dizziness, nausea or poor balance with using left arm- planned for conservative management unless new sx.  Stopped norvasc for leg swelling and started aldactone.  Missed 01/08/21 visit. Seen 05/18/21.  Patient notes that she is doing ***.   Since day prior/last visit notes *** . There are no*** interval hospital/ED visit.    No chest pain or pressure ***.  No SOB/DOE*** and no PND/Orthopnea***.  No weight gain or leg swelling***.  No palpitations or syncope ***.  Ambulatory blood pressure ***.   Past Medical History:  Diagnosis Date   Acute cystitis    UTI   Anxiety    Arthritis    Benign positional vertigo    Dermatitis    Diabetes mellitus without complication ([place])    Family history of adverse reaction to anesthesia    GERD (gastroesophageal reflux disease)    Goiter, nontoxic, multinodular    Gout    H/O: hysterectomy    History of tobacco use    Hx laparoscopic cholecystectomy    Hypercholesterolemia    Hypertension    Incomplete bladder emptying    Insomnia    Postmenopausal atrophic vaginitis    RA (rheumatoid arthritis) ([place])    Rectocele    Renal calculus    Sleep apnea    Thyroid condition    Type 2 diabetes mellitus ([place])    Urolithiasis    Vaginal enterocele     Past Surgical History:  Procedure Laterality Date   CARPAL TUNNEL RELEASE  2018   CATARACT EXTRACTION, BILATERAL      CHOLECYSTECTOMY     COLONOSCOPY     07/11/2009, 02/2015   GALLBLADDER SURGERY     KNEE ARTHROSCOPY Right 05/20/2020   Procedure: RIGHT KNEE ARTHROSCOPY AND DEBRIDEMENT;  Surgeon: [name], MD;  Location: [facility];  Service: Orthopedics;  Laterality: Right;   PARTIAL HYSTERECTOMY     Overies left intact   SQUAMOUS CELL CARCINOMA EXCISION  05/2018    Current Medications: No outpatient medications have been marked as taking for the 05/18/21 encounter (Appointment) with Werner Lean, MD.     Allergies:   Amaryl [glimepiride], Amlodipine, Codeine, Jardiance [empagliflozin], Pneumovax 23 [pneumococcal vac polyvalent], Wellbutrin [bupropion], and Latex   Social History   Socioeconomic History   Marital status: Married    Spouse name: Not on file   Number of children: Not on file   Years of education: Not on file   Highest education level: Not on file  Occupational History   Not on file  Tobacco Use   Smoking status: Former    Packs/day: 0.20    Years: 10.00    Pack years: 2.00    Types: Cigarettes    Quit date: 07/05/1978    Years since quitting: 42.8   Smokeless tobacco: Never  Vaping Use   Vaping Use:  Never used  Substance and Sexual Activity   Alcohol use: No   Drug use: No   Sexual activity: Not Currently  Other Topics Concern   Not on file  Social History Narrative   Diet: Blank      Do you drink/ eat things with caffeine? Yes      Marital status:  Married                             What year were you married ? 196      Do you live in a house, apartment,assistred living, condo, trailer, etc.)? House      Is it one or more stories? 1      How many persons live in your home ? 2      Do you have any pets in your home ?(please list)  1 Sam-Dog      Highest Level of education completed: 12      Current or past profession: Office      Do you exercise?   Some                           Type & how often  Walk Daily      ADVANCED  DIRECTIVES (Please bring copies)      Do you have a living will? Blank      Do you have a DNR form?  Blank                     If not, do you want to discuss one?       Do you have signed POA?HPOA forms?  Yes               If so, please bring to your appointment      FUNCTIONAL STATUS- To be completed by Spouse / child / Staff       Do you have difficulty bathing or dressing yourself ?  No      Do you have difficulty preparing food or eating ?  No      Do you have difficulty managing your mediation ?  No      Do you have difficulty managing your finances ?  No      Do you have difficulty affording your medication ?  Yes (Some)      Social Determinants of Health   Financial Resource Strain: Not on file  Food Insecurity: Not on file  Transportation Needs: Not on file  Physical Activity: Not on file  Stress: Not on file  Social Connections: Not on file    Family History: The patient's family history includes Crohn's disease in her son; Diabetes in her mother and son; Heart disease (age of onset: 17) in her father. Denies family history of sudden cardiac death including drowning, car accidents, or unexplained deaths in the family. No history of aortic dissection, subclavian stenosis, or coarctation of the aorta in the family  ROS:   Please see the history of present illness.    Right knee pain and recovering from knee surgery All other systems reviewed and are negative.  EKGs/Labs/Other Studies Reviewed:    The following studies were reviewed today:  EKG:   05/16/20 SR rate 84 with non-specific T wave flattening  Transthoracic Echocardiogram Date: 07/08/20 Results  1. Left ventricular ejection fraction, by estimation, is >75%. The left  ventricle  has hyperdynamic function. The left ventricle has no regional  wall motion abnormalities. Left ventricular diastolic parameters are  consistent with Grade I diastolic  dysfunction (impaired relaxation). Elevated left atrial  pressure.   2. Right ventricular systolic function is normal. The right ventricular  size is normal. There is mildly elevated pulmonary artery systolic  pressure.   3. The mitral valve is normal in structure. Mild mitral valve  regurgitation. No evidence of mitral stenosis.   4. The aortic valve is tricuspid. Aortic valve regurgitation is trivial.  Mild aortic valve sclerosis is present, with no evidence of aortic valve  stenosis.   5. The inferior vena cava is normal in size with greater than 50%  respiratory variability, suggesting right atrial pressure of 3 mmHg.  Carotid Artery Duplex: Date: 07/16/20 Results: Summary:  Right Carotid: Velocities in the right ICA are consistent with a 1-39%  stenosis.   Left Carotid: Velocities in the left ICA are consistent with a 40-59%  stenosis.   Vertebrals:  Right vertebral artery demonstrates antegrade flow. Left  vertebral               artery demonstrates retrograde flow, consistent with  subclavian               steal syndrome.  Subclavians: Left subclavian artery flow was disturbed, with dampened  monophasic               flow consistent with a more proximal stenosis or occlusion.  Normal               flow hemodynamics were seen in the right subclavian artery.               There is a 20 mmHg difference brachial pressure gradient,  with the               left arm being the lowest. The right brachial artery is  patent with               triphasic flow, the left brachial artery is patent with               bi/monophasic flow.   NonCardiac CT: Date: 07/08/20 Results: IMPRESSION: Vascular:  1. Severe stenosis of the proximal left subclavian artery secondary to calcified atherosclerotic plaque. 2. Widely patent and normal appearing right brachiocephalic and right subclavian arteries. No evidence of aortic coarctation, as queried. 3.  Aortic Atherosclerosis   Recent Labs: 02/11/2021: Magnesium 1.6 04/09/2021: Hemoglobin 11.8;  Platelets 335; TSH 1.81 04/29/2021: ALT 12; BUN 28; Creat 1.20; Potassium 4.9; Sodium 137  Recent Lipid Panel    Component Value Date/Time   CHOL 126 01/22/2020 0000   TRIG 164 (A) 01/22/2020 0000   HDL 44 01/22/2020 0000   LDLCALC 55 01/22/2020 0000     Physical Exam:    VS:  There were no vitals taken for this visit.    Right arm BP used  Wt Readings from Last 3 Encounters:  05/11/21 163 lb 3.2 oz (74 kg)  04/29/21 163 lb 12.8 oz (74.3 kg)  04/09/21 165 lb 6.4 oz (75 kg)    Gen: *** distress, *** obese/well nourished/malnourished   Neck: No JVD, *** carotid bruit Ears: *** Frank Sign Cardiac: No Rubs or Gallops, *** Murmur, ***cardia, *** radial pulses Respiratory: Clear to auscultation bilaterally, *** effort, ***  respiratory rate GI: Soft, nontender, non-distended *** MS: No *** edema; *** moves all extremities Integument: Skin feels *** Neuro:  At time of evaluation, alert and oriented to person/place/time/situation *** Psych: Normal affect, patient feels ***   ASSESSMENT:    No diagnosis found.  PLAN:    In order of problems listed above:  Aortic Atherosclerosis Hyperlipidemia -LDL goal less than 70 *** -continue current statin  Subclavian Stenosis - Treatment is indicated for symptomatic patients with arm claudication, cerebral hypoperfusion, and subclavian steal syndrome. *** - Will use right arm blood pressures for goal - continue ASA 81 mg Po daily  Hypertension with Diabetes - ambulatory blood pressure *** - Pharm D clinic X1 then stopped ***  Will plan for *** follow up unless new symptoms or abnormal test results warranting change in plan  Would be reasonable for *** APP Follow up    Medication Adjustments/Labs and Tests Ordered: Current medicines are reviewed at length with the patient today.  Concerns regarding medicines are outlined above.  No orders of the defined types were placed in this encounter.  No orders of the defined types  were placed in this encounter.   There are no Patient Instructions on file for this visit.   Signed, Werner Lean, MD  05/18/2021 7:41 AM    [place]

## 2021-06-01 ENCOUNTER — Telehealth: Payer: Self-pay | Admitting: Endocrinology

## 2021-06-01 ENCOUNTER — Ambulatory Visit (INDEPENDENT_AMBULATORY_CARE_PROVIDER_SITE_OTHER): Payer: Medicare Other | Admitting: Endocrinology

## 2021-06-01 ENCOUNTER — Other Ambulatory Visit: Payer: Self-pay

## 2021-06-01 VITALS — BP 160/40 | HR 92 | Ht 62.0 in | Wt 161.6 lb

## 2021-06-01 DIAGNOSIS — Z794 Long term (current) use of insulin: Secondary | ICD-10-CM | POA: Diagnosis not present

## 2021-06-01 DIAGNOSIS — E118 Type 2 diabetes mellitus with unspecified complications: Secondary | ICD-10-CM

## 2021-06-01 MED ORDER — INSULIN NPH (HUMAN) (ISOPHANE) 100 UNIT/ML ~~LOC~~ SUSP: SUBCUTANEOUS | 3 refills | Status: DC

## 2021-06-01 MED ORDER — TRULICITY 1.5 MG/0.5ML ~~LOC~~ SOAJ: 1.5000 mg | SUBCUTANEOUS | 3 refills | Status: DC

## 2021-06-01 NOTE — Telephone Encounter (Signed)
Spoke with the patient regarding her high sugars. Patient's readings the past day and today are: 05/31/21 403 in the a.m. and 394 in the p.m. 06/01/21 398 in the a.m.patient rechecked and went down to 359 Patient verified that she is taking Trulicity 6.72 last taken on Sunday 11/27 and is taking Humulin 30 units in the a.m. and 20 units in the p.m. In regards to her meals she is barely eating, had toast and coffee this morning. Please advise

## 2021-06-01 NOTE — Progress Notes (Signed)
Subjective:    Patient ID: Annette Suarez, female    DOB: Nov 11, 1940, 80 y.o.   MRN: [id number]  HPI Pt returns for f/u of diabetes mellitus: DM type: Insulin-requiring type 2 Dx'ed: 3536 Complications: PAD and CRI Therapy: insulin since 1443, and Trulicity.  GDM: never DKA: never Severe hypoglycemia: never Pancreatitis: never Pancreatic imaging: normal on 2019 CT SDOH: none Other: She did not tolerate Jardiance (UTI's) or metformin (nausea) Interval history: No recent steroids.  She takes DM meds as rx'ed.  she brings a record of her cbg's which I have reviewed today.  cbg's vary from 215-403.  There is no trend throughout the day.   Past Medical History:  Diagnosis Date   Acute cystitis    UTI   Anxiety    Arthritis    Benign positional vertigo    Dermatitis    Diabetes mellitus without complication ([place])    Family history of adverse reaction to anesthesia    GERD (gastroesophageal reflux disease)    Goiter, nontoxic, multinodular    Gout    H/O: hysterectomy    History of tobacco use    Hx laparoscopic cholecystectomy    Hypercholesterolemia    Hypertension    Incomplete bladder emptying    Insomnia    Postmenopausal atrophic vaginitis    RA (rheumatoid arthritis) ([place])    Rectocele    Renal calculus    Sleep apnea    Thyroid condition    Type 2 diabetes mellitus ([place])    Urolithiasis    Vaginal enterocele     Past Surgical History:  Procedure Laterality Date   CARPAL TUNNEL RELEASE  2018   CATARACT EXTRACTION, BILATERAL     CHOLECYSTECTOMY     COLONOSCOPY     07/11/2009, 02/2015   GALLBLADDER SURGERY     KNEE ARTHROSCOPY Right 05/20/2020   Procedure: RIGHT KNEE ARTHROSCOPY AND DEBRIDEMENT;  Surgeon: [name], MD;  Location: [facility];  Service: Orthopedics;  Laterality: Right;   PARTIAL HYSTERECTOMY     Overies left intact   SQUAMOUS CELL CARCINOMA EXCISION  05/2018    Social History   Socioeconomic History   Marital status:  Married    Spouse name: Not on file   Number of children: Not on file   Years of education: Not on file   Highest education level: Not on file  Occupational History   Not on file  Tobacco Use   Smoking status: Former    Packs/day: 0.20    Years: 10.00    Pack years: 2.00    Types: Cigarettes    Quit date: 07/05/1978    Years since quitting: 42.9   Smokeless tobacco: Never  Vaping Use   Vaping Use: Never used  Substance and Sexual Activity   Alcohol use: No   Drug use: No   Sexual activity: Not Currently  Other Topics Concern   Not on file  Social History Narrative   Diet: Blank      Do you drink/ eat things with caffeine? Yes      Marital status:  Married                             What year were you married ? 196      Do you live in a house, apartment,assistred living, condo, trailer, etc.)? House      Is it one or more stories? 1  How many persons live in your home ? 2      Do you have Annette pets in your home ?(please list)  1 Sam-Dog      Highest Level of education completed: 12      Current or past profession: Office      Do you exercise?   Some                           Type & how often  Walk Daily      ADVANCED DIRECTIVES (Please bring copies)      Do you have a living will? Blank      Do you have a DNR form?  Blank                     If not, do you want to discuss one?       Do you have signed POA?HPOA forms?  Yes               If so, please bring to your appointment      FUNCTIONAL STATUS- To be completed by Spouse / child / Staff       Do you have difficulty bathing or dressing yourself ?  No      Do you have difficulty preparing food or eating ?  No      Do you have difficulty managing your mediation ?  No      Do you have difficulty managing your finances ?  No      Do you have difficulty affording your medication ?  Yes (Some)      Social Determinants of Health   Financial Resource Strain: Not on file  Food Insecurity: Not on file   Transportation Needs: Not on file  Physical Activity: Not on file  Stress: Not on file  Social Connections: Not on file  Intimate Partner Violence: Not on file    Current Outpatient Medications on File Prior to Visit  Medication Sig Dispense Refill   Accu-Chek Softclix Lancets lancets Use to test blood sugar three times daily. Dx: E11.8 300 each 1   aspirin EC 81 MG tablet Take 81 mg by mouth daily.     chlorthalidone (HYGROTON) 25 MG tablet Take 1 tablet (25 mg total) by mouth daily. 90 tablet 3   Continuous Blood Gluc Sensor (FREESTYLE LIBRE 2 SENSOR) MISC 1 application by Does not apply route every 14 (fourteen) days. 2 each 3   CRANBERRY PO Take by mouth daily.     Diclofenac Sodium (VOLTAREN EX) Apply 1 application topically 3 (three) times daily as needed.     estradiol (ESTRACE) 0.1 MG/GM vaginal cream Place 1 Applicatorful vaginally 3 (three) times a week. 42.5 g 0   etanercept (ENBREL SURECLICK) 50 MG/ML injection Inject 50 mg into the skin once a week. 12 mL 0   fluocinonide (LIDEX) 0.05 % external solution Apply 1 application topically daily as needed.     glucose blood (ACCU-CHEK GUIDE) test strip USE TO CHECK BLOOD SUGAR THREE TIMES A DAY 300 strip 1   irbesartan (AVAPRO) 300 MG tablet Take 1 tablet (300 mg total) by mouth daily. 90 tablet 3   loperamide (IMODIUM A-D) 2 MG tablet Take 2 mg by mouth as needed for diarrhea or loose stools.     LORazepam (ATIVAN) 0.5 MG tablet Take 1 tablet (0.5 mg total) by mouth at bedtime. 30 tablet 3  MELATONIN PO Take 10 mg by mouth at bedtime as needed.     Omeprazole 20 MG TBDD Take 20 mg by mouth daily.     ondansetron (ZOFRAN) 4 MG tablet Take 4 mg by mouth 3 (three) times daily as needed for nausea or vomiting.     phenazopyridine (PYRIDIUM) 200 MG tablet Take 200 mg by mouth 3 (three) times daily.     Probiotic Product (PROBIOTIC PO) Take by mouth daily.     simvastatin (ZOCOR) 20 MG tablet TAKE ONE TABLET BY MOUTH EVERY EVENING 90  tablet 1   spironolactone (ALDACTONE) 25 MG tablet Take 1 tablet (25 mg total) by mouth daily. 90 tablet 3   No current facility-administered medications on file prior to visit.    Allergies  Allergen Reactions   Amaryl [Glimepiride]     Don't Remember   Amlodipine     LE edema on 7.5mg  daily. Tolerates 5mg  daily just fine   Codeine     SICK   Jardiance [Empagliflozin]     Urinary tract infection- SIDE EFFECT   Pneumovax 23 [Pneumococcal Vac Polyvalent]     Red / Swelling / Hot to touch SIDE EFFECTS   Wellbutrin [Bupropion]     Unknown   Latex Rash    Family History  Problem Relation Age of Onset   Diabetes Mother    Heart disease Father 26   Diabetes Son    Crohn's disease Son     BP (!) 160/40   Pulse 92   Ht 5\' 2"  (1.575 m)   Wt 161 lb 9.6 oz (73.3 kg)   SpO2 97%   BMI 29.56 kg/m   Review of Systems Denies N/V/SOB/fever.      Objective:   Physical Exam      Assessment & Plan:  Insulin-requiring type 2 DM: uncontrolled.    Patient Instructions  check your blood sugar twice a day.  vary the time of day when you check, between before the 3 meals, and at bedtime.  also check if you have symptoms of your blood sugar being too high or too low.  please keep a record of the readings and bring it to your next appointment here (or you can bring the meter itself).  You can write it on Annette piece of paper.  please call us sooner if your blood sugar goes below 70, or if most of your readings are over 200.   We will need to take this complex situation in stages.   I have sent a prescription to your pharmacy, to double the Trulicity. You can use up your current pens by taking 2 shots per week, and:  increase the insulin to 35 units with breakfast, and 25 units with supper.  Please come back for a follow-up appointment in 6 weeks.

## 2021-06-01 NOTE — Telephone Encounter (Signed)
PT called experiencing high BS: 05/31/2021 Blood Sugar 403 06/01/2021 Blood Sugar 398  Please call PT ASAP Phone:  [phone]

## 2021-06-01 NOTE — Patient Instructions (Addendum)
check your blood sugar twice a day.  vary the time of day when you check, between before the 3 meals, and at bedtime.  also check if you have symptoms of your blood sugar being too high or too low.  please keep a record of the readings and bring it to your next appointment here (or you can bring the meter itself).  You can write it on any piece of paper.  please call us sooner if your blood sugar goes below 70, or if most of your readings are over 200.   We will need to take this complex situation in stages.   I have sent a prescription to your pharmacy, to double the Trulicity. You can use up your current pens by taking 2 shots per week, and:  increase the insulin to 35 units with breakfast, and 25 units with supper.  Please come back for a follow-up appointment in 6 weeks.

## 2021-06-10 ENCOUNTER — Other Ambulatory Visit: Payer: Self-pay

## 2021-06-10 ENCOUNTER — Encounter: Payer: Self-pay | Admitting: Internal Medicine

## 2021-06-10 ENCOUNTER — Ambulatory Visit: Payer: Medicare Other | Admitting: Endocrinology

## 2021-06-10 ENCOUNTER — Ambulatory Visit: Payer: Medicare Other | Admitting: Internal Medicine

## 2021-06-10 VITALS — BP 140/60 | HR 92 | Ht 61.0 in | Wt 158.0 lb

## 2021-06-10 DIAGNOSIS — I700 Atherosclerosis of aorta: Secondary | ICD-10-CM

## 2021-06-10 DIAGNOSIS — I771 Stricture of artery: Secondary | ICD-10-CM | POA: Diagnosis not present

## 2021-06-10 DIAGNOSIS — I152 Hypertension secondary to endocrine disorders: Secondary | ICD-10-CM | POA: Diagnosis not present

## 2021-06-10 DIAGNOSIS — E1159 Type 2 diabetes mellitus with other circulatory complications: Secondary | ICD-10-CM

## 2021-06-10 NOTE — Patient Instructions (Signed)
Medication Instructions:  Your physician recommends that you continue on your current medications as directed. Please refer to the Current Medication list given to you today.  *If you need a refill on your cardiac medications before your next appointment, please call your pharmacy*   Lab Work: NONE If you have labs (blood work) drawn today and your tests are completely normal, you will receive your results only by: [place] (if you have MyChart) OR A paper copy in the mail If you have any lab test that is abnormal or we need to change your treatment, we will call you to review the results.   Testing/Procedures: NONE   Follow-Up: At [facility], you and your health needs are our priority.  As part of our continuing mission to provide you with exceptional heart care, we have created designated Provider Care Teams.  These Care Teams include your primary Cardiologist (physician) and Advanced Practice Providers (APPs -  Physician Assistants and Nurse Practitioners) who all work together to provide you with the care you need, when you need it.  We recommend signing up for the patient portal called "MyChart".  Sign up information is provided on this After Visit Summary.  MyChart is used to connect with patients for Virtual Visits (Telemedicine).  Patients are able to view lab/test results, encounter notes, upcoming appointments, etc.  Non-urgent messages can be sent to your provider as well.   To learn more about what you can do with MyChart, go to [email or web address].    Your next appointment:   12 month(s)  The format for your next appointment:   In Person  Provider:   Werner Lean, MD

## 2021-06-10 NOTE — Progress Notes (Signed)
Cardiology Office Note:    Date:  06/10/2021   ID:  Annette Suarez, DOB 06/10/1941, MRN [id number]  PCP:  [name], NP  [facility] Cardiologist:  Rudean Haskell MD [place] Electrophysiologist:  None   CC: Follow up Subclavian Stenosis  History of Present Illness:    Annette Suarez is a 80 y.o. female with a hx of Diabetes and HTN, HLD, OSA not on CPAP who presents for evaluation 06/11/20.  In interim, had CT with new diagnosis of subclavian stenosis and aortic atherosclerosis.  Seen in 3/22, then saw Dr. Fletcher Anon: no  left arm claudication.  No symptoms of dizziness, nausea or poor balance with using left arm- planned for conservative management unless new sx.  Stopped norvasc for leg swelling and started aldactone.  Missed 01/08/21 visit. Missed 05/18/21.  Seen 06/10/21  Patient notes that she is still battling knee pain.  Has some intentional weight loss. There are no interval hospital/ED visit.    No chest pain or pressure .  No SOB/DOE and no PND/Orthopnea.  No weight gain or leg swelling.  No palpitations or syncope.  No balance issues, dizziness, and arm.  Does not that she is dropping things with her left arm more frequently but also has arthritis.    Ambulatory blood pressure 128/ 68 on average logs reviewed.   Past Medical History:  Diagnosis Date   Acute cystitis    UTI   Anxiety    Arthritis    Benign positional vertigo    Dermatitis    Diabetes mellitus without complication ([place])    Family history of adverse reaction to anesthesia    GERD (gastroesophageal reflux disease)    Goiter, nontoxic, multinodular    Gout    H/O: hysterectomy    History of tobacco use    Hx laparoscopic cholecystectomy    Hypercholesterolemia    Hypertension    Incomplete bladder emptying    Insomnia    Postmenopausal atrophic vaginitis    RA (rheumatoid arthritis) ([place])    Rectocele    Renal calculus    Sleep apnea    Thyroid condition    Type 2 diabetes mellitus ([place])     Urolithiasis    Vaginal enterocele     Past Surgical History:  Procedure Laterality Date   CARPAL TUNNEL RELEASE  2018   CATARACT EXTRACTION, BILATERAL     CHOLECYSTECTOMY     COLONOSCOPY     07/11/2009, 02/2015   GALLBLADDER SURGERY     KNEE ARTHROSCOPY Right 05/20/2020   Procedure: RIGHT KNEE ARTHROSCOPY AND DEBRIDEMENT;  Surgeon: [name], MD;  Location: [facility];  Service: Orthopedics;  Laterality: Right;   PARTIAL HYSTERECTOMY     Overies left intact   SQUAMOUS CELL CARCINOMA EXCISION  05/2018    Current Medications: Current Meds  Medication Sig   Accu-Chek Softclix Lancets lancets Use to test blood sugar three times daily. Dx: E11.8   aspirin EC 81 MG tablet Take 81 mg by mouth daily.   chlorthalidone (HYGROTON) 25 MG tablet Take 1 tablet (25 mg total) by mouth daily.   Continuous Blood Gluc Sensor (FREESTYLE LIBRE 2 SENSOR) MISC 1 application by Does not apply route every 14 (fourteen) days.   CRANBERRY PO Take by mouth daily.   Diclofenac Sodium (VOLTAREN EX) Apply 1 application topically 3 (three) times daily as needed.   Dulaglutide (TRULICITY) 1.5 AT/5.5DD SOPN Inject 1.5 mg into the skin once a week.  estradiol (ESTRACE) 0.1 MG/GM vaginal cream Place 1 Applicatorful vaginally 3 (three) times a week.   etanercept (ENBREL SURECLICK) 50 MG/ML injection Inject 50 mg into the skin once a week.   fluocinonide (LIDEX) 0.05 % external solution Apply 1 application topically daily as needed.   glucose blood (ACCU-CHEK GUIDE) test strip USE TO CHECK BLOOD SUGAR THREE TIMES A DAY   insulin NPH Human (HUMULIN N) 100 UNIT/ML injection 35 units each morning, and 25 units each evening, and syringes 2/day.   irbesartan (AVAPRO) 300 MG tablet Take 1 tablet (300 mg total) by mouth daily.   loperamide (IMODIUM A-D) 2 MG tablet Take 2 mg by mouth as needed for diarrhea or loose stools.   LORazepam (ATIVAN) 0.5 MG tablet Take 1 tablet (0.5 mg total) by mouth at  bedtime.   MELATONIN PO Take 10 mg by mouth at bedtime as needed.   Omeprazole 20 MG TBDD Take 20 mg by mouth daily.   ondansetron (ZOFRAN) 4 MG tablet Take 4 mg by mouth 3 (three) times daily as needed for nausea or vomiting.   Probiotic Product (PROBIOTIC PO) Take by mouth daily.   simvastatin (ZOCOR) 20 MG tablet TAKE ONE TABLET BY MOUTH EVERY EVENING   spironolactone (ALDACTONE) 25 MG tablet Take 1 tablet (25 mg total) by mouth daily.     Allergies:   Amaryl [glimepiride], Amlodipine, Codeine, Jardiance [empagliflozin], Pneumovax 23 [pneumococcal vac polyvalent], Wellbutrin [bupropion], and Latex   Social History   Socioeconomic History   Marital status: Married    Spouse name: Not on file   Number of children: Not on file   Years of education: Not on file   Highest education level: Not on file  Occupational History   Not on file  Tobacco Use   Smoking status: Former    Packs/day: 0.20    Years: 10.00    Pack years: 2.00    Types: Cigarettes    Quit date: 07/05/1978    Years since quitting: 42.9   Smokeless tobacco: Never  Vaping Use   Vaping Use: Never used  Substance and Sexual Activity   Alcohol use: No   Drug use: No   Sexual activity: Not Currently  Other Topics Concern   Not on file  Social History Narrative   Diet: Blank      Do you drink/ eat things with caffeine? Yes      Marital status:  Married                             What year were you married ? 196      Do you live in a house, apartment,assistred living, condo, trailer, etc.)? House      Is it one or more stories? 1      How many persons live in your home ? 2      Do you have any pets in your home ?(please list)  1 Sam-Dog      Highest Level of education completed: 12      Current or past profession: Office      Do you exercise?   Some                           Type & how often  Walk Daily      ADVANCED DIRECTIVES (Please bring copies)      Do you have a living will?  Blank      Do you  have a DNR form?  Blank                     If not, do you want to discuss one?       Do you have signed POA?HPOA forms?  Yes               If so, please bring to your appointment      FUNCTIONAL STATUS- To be completed by Spouse / child / Staff       Do you have difficulty bathing or dressing yourself ?  No      Do you have difficulty preparing food or eating ?  No      Do you have difficulty managing your mediation ?  No      Do you have difficulty managing your finances ?  No      Do you have difficulty affording your medication ?  Yes (Some)      Social Determinants of Health   Financial Resource Strain: Not on file  Food Insecurity: Not on file  Transportation Needs: Not on file  Physical Activity: Not on file  Stress: Not on file  Social Connections: Not on file    Family History: The patient's family history includes Crohn's disease in her son; Diabetes in her mother and son; Heart disease (age of onset: 63) in her father. Denies family history of sudden cardiac death including drowning, car accidents, or unexplained deaths in the family. No history of aortic dissection, subclavian stenosis, or coarctation of the aorta in the family  ROS:   Please see the history of present illness.    Right knee pain and recovering from knee surgery All other systems reviewed and are negative.  EKGs/Labs/Other Studies Reviewed:    The following studies were reviewed today:  EKG:   06/10/21 SR rate 92  05/16/20 SR rate 84 with non-specific T wave flattening  Transthoracic Echocardiogram Date: 07/08/20 Results  1. Left ventricular ejection fraction, by estimation, is >75%. The left  ventricle has hyperdynamic function. The left ventricle has no regional  wall motion abnormalities. Left ventricular diastolic parameters are  consistent with Grade I diastolic  dysfunction (impaired relaxation). Elevated left atrial pressure.   2. Right ventricular systolic function is normal. The  right ventricular  size is normal. There is mildly elevated pulmonary artery systolic  pressure.   3. The mitral valve is normal in structure. Mild mitral valve  regurgitation. No evidence of mitral stenosis.   4. The aortic valve is tricuspid. Aortic valve regurgitation is trivial.  Mild aortic valve sclerosis is present, with no evidence of aortic valve  stenosis.   5. The inferior vena cava is normal in size with greater than 50%  respiratory variability, suggesting right atrial pressure of 3 mmHg.  Carotid Artery Duplex: Date: 07/16/20 Results: Summary:  Right Carotid: Velocities in the right ICA are consistent with a 1-39%  stenosis.   Left Carotid: Velocities in the left ICA are consistent with a 40-59%  stenosis.   Vertebrals:  Right vertebral artery demonstrates antegrade flow. Left  vertebral               artery demonstrates retrograde flow, consistent with  subclavian               steal syndrome.  Subclavians: Left subclavian artery flow was disturbed, with dampened  monophasic  flow consistent with a more proximal stenosis or occlusion.  Normal               flow hemodynamics were seen in the right subclavian artery.               There is a 20 mmHg difference brachial pressure gradient,  with the               left arm being the lowest. The right brachial artery is  patent with               triphasic flow, the left brachial artery is patent with               bi/monophasic flow.   NonCardiac CT: Date: 07/08/20 Results: IMPRESSION: Vascular:  1. Severe stenosis of the proximal left subclavian artery secondary to calcified atherosclerotic plaque. 2. Widely patent and normal appearing right brachiocephalic and right subclavian arteries. No evidence of aortic coarctation, as queried. 3.  Aortic Atherosclerosis   Recent Labs: 02/11/2021: Magnesium 1.6 04/09/2021: Hemoglobin 11.8; Platelets 335; TSH 1.81 04/29/2021: ALT 12; BUN 28; Creat 1.20;  Potassium 4.9; Sodium 137  Recent Lipid Panel    Component Value Date/Time   CHOL 126 01/22/2020 0000   TRIG 164 (A) 01/22/2020 0000   HDL 44 01/22/2020 0000   LDLCALC 55 01/22/2020 0000     Physical Exam:    VS:  BP 140/60   Pulse 92   Ht 5\' 1"  (1.549 m)   Wt 158 lb (71.7 kg)   SpO2 99%   BMI 29.85 kg/m     Right arm BP used  Wt Readings from Last 3 Encounters:  06/10/21 158 lb (71.7 kg)  06/01/21 161 lb 9.6 oz (73.3 kg)  05/11/21 163 lb 3.2 oz (74 kg)    Gen: no distress, elderly female Neck: No JVD, L subclavian bruit Cardiac: No Rubs or Gallops, soft systolic murmur, normal rate radial pulses Respiratory: Clear to auscultation bilaterally, normal effort, normal  respiratory rate GI: Soft, nontender, non-distended  MS: No  edema;  moves all extremities Integument: Skin feels warm Neuro:  At time of evaluation, alert and oriented to person/place/time/situation  Psych: Normal affect, patient feels    ASSESSMENT:    1. Hypertension associated with diabetes ([place])   2. Subclavian arterial stenosis ([place])   3. Aortic atherosclerosis ([facility])     PLAN:     Hypertension with Diabetes - ambulatory blood pressure at goal on MRA - no change in meds  Aortic Atherosclerosis Hyperlipidemia -LDL goal less than 55  -continue current statin  Subclavian Stenosis - Treatment is indicated for symptomatic patients with arm claudication, cerebral hypoperfusion, and subclavian steal syndrome.  - Will use right arm blood pressures  - continue ASA 81 mg Po daily   Will plan for one year follow up unless new symptoms or abnormal test results warranting change in plan  Would be reasonable for me or APP Follow up    Medication Adjustments/Labs and Tests Ordered: Current medicines are reviewed at length with the patient today.  Concerns regarding medicines are outlined above.  Orders Placed This Encounter  Procedures   EKG 12-Lead    No orders of the defined types were  placed in this encounter.   Patient Instructions  Medication Instructions:  Your physician recommends that you continue on your current medications as directed. Please refer to the Current Medication list given to you today.  *If you need a refill on  your cardiac medications before your next appointment, please call your pharmacy*   Lab Work: NONE If you have labs (blood work) drawn today and your tests are completely normal, you will receive your results only by: [place] (if you have MyChart) OR A paper copy in the mail If you have any lab test that is abnormal or we need to change your treatment, we will call you to review the results.   Testing/Procedures: NONE   Follow-Up: At [facility], you and your health needs are our priority.  As part of our continuing mission to provide you with exceptional heart care, we have created designated Provider Care Teams.  These Care Teams include your primary Cardiologist (physician) and Advanced Practice Providers (APPs -  Physician Assistants and Nurse Practitioners) who all work together to provide you with the care you need, when you need it.  We recommend signing up for the patient portal called "MyChart".  Sign up information is provided on this After Visit Summary.  MyChart is used to connect with patients for Virtual Visits (Telemedicine).  Patients are able to view lab/test results, encounter notes, upcoming appointments, etc.  Non-urgent messages can be sent to your provider as well.   To learn more about what you can do with MyChart, go to [email or web address].    Your next appointment:   12 month(s)  The format for your next appointment:   In Person  Provider:   Werner Lean, MD       Signed, Werner Lean, MD  06/10/2021 12:02 PM    [place]

## 2021-06-10 NOTE — Telephone Encounter (Signed)
NA

## 2021-06-22 ENCOUNTER — Other Ambulatory Visit: Payer: Self-pay | Admitting: *Deleted

## 2021-06-22 DIAGNOSIS — Z79899 Other long term (current) drug therapy: Secondary | ICD-10-CM

## 2021-06-22 DIAGNOSIS — M0609 Rheumatoid arthritis without rheumatoid factor, multiple sites: Secondary | ICD-10-CM

## 2021-06-22 MED ORDER — ENBREL SURECLICK 50 MG/ML ~~LOC~~ SOAJ: 50.0000 mg | SUBCUTANEOUS | 0 refills | Status: DC

## 2021-06-22 NOTE — Telephone Encounter (Signed)
Next Visit: 07/29/2021  Last Visit: 04/29/2021  Last Fill: 03/30/2021  [id number] arthritis of multiple sites with negative rheumatoid factor  Current Dose per office note 04/29/2021: Enbrel 50 mg sq injections once weekly  Labs: 04/09/2021 CBC: RBC 3.70, 04/29/2021 Glucose is elevated.  Creatinine is elevated and stable.  TB Gold: 07/14/2020 Neg    Okay to refill Enbrel?

## 2021-07-09 ENCOUNTER — Ambulatory Visit: Payer: Medicare Other | Admitting: Orthopedic Surgery

## 2021-07-09 DIAGNOSIS — Z1231 Encounter for screening mammogram for malignant neoplasm of breast: Secondary | ICD-10-CM | POA: Diagnosis not present

## 2021-07-14 ENCOUNTER — Ambulatory Visit: Payer: Medicare Other | Admitting: Endocrinology

## 2021-07-14 ENCOUNTER — Other Ambulatory Visit: Payer: Self-pay

## 2021-07-14 VITALS — BP 170/70 | HR 100 | Ht 61.0 in | Wt 158.4 lb

## 2021-07-14 DIAGNOSIS — E118 Type 2 diabetes mellitus with unspecified complications: Secondary | ICD-10-CM | POA: Diagnosis not present

## 2021-07-14 DIAGNOSIS — Z794 Long term (current) use of insulin: Secondary | ICD-10-CM | POA: Diagnosis not present

## 2021-07-14 LAB — POCT GLYCOSYLATED HEMOGLOBIN (HGB A1C): Hemoglobin A1C: 8.6 % — AB (ref 4.0–5.6)

## 2021-07-14 MED ORDER — INSULIN NPH (HUMAN) (ISOPHANE) 100 UNIT/ML ~~LOC~~ SUSP: SUBCUTANEOUS | 3 refills | Status: DC

## 2021-07-14 NOTE — Patient Instructions (Addendum)
check your blood sugar twice a day.  vary the time of day when you check, between before the 3 meals, and at bedtime.  also check if you have symptoms of your blood sugar being too high or too low.  please keep a record of the readings and bring it to your next appointment here (or you can bring the meter itself).  You can write it on any piece of paper.  please call us sooner if your blood sugar goes below 70, or if most of your readings are over 200.   Please continue the same Trulicity, at least for now.  increase the insulin to 40 units with breakfast, and 25 units with supper.  Please come back for a follow-up appointment in 6 weeks.

## 2021-07-14 NOTE — Progress Notes (Signed)
Subjective:    Patient ID: Annette Suarez, female    DOB: [birth year], 81 y.o.   MRN: [id number]  HPI Pt returns for f/u of diabetes mellitus: DM type: Insulin-requiring type 2 Dx'ed: 5885 Complications: PAD and CRI Therapy: insulin since 0277, and Trulicity.  GDM: never DKA: never Severe hypoglycemia: never Pancreatitis: never Pancreatic imaging: normal on 2019 CT SDOH: none Other: She did not tolerate Jardiance (UTI's) or metformin (nausea).  Interval history: No recent steroids.  She takes DM meds as rx'ed.  she brings a record of her cbg's which I have reviewed today.  cbg's vary from 131-280.  It is in general higher as the day goes on. Past Medical History:  Diagnosis Date   Acute cystitis    UTI   Anxiety    Arthritis    Benign positional vertigo    Dermatitis    Diabetes mellitus without complication ([place])    Family history of adverse reaction to anesthesia    GERD (gastroesophageal reflux disease)    Goiter, nontoxic, multinodular    Gout    H/O: hysterectomy    History of tobacco use    Hx laparoscopic cholecystectomy    Hypercholesterolemia    Hypertension    Incomplete bladder emptying    Insomnia    Postmenopausal atrophic vaginitis    RA (rheumatoid arthritis) ([place])    Rectocele    Renal calculus    Sleep apnea    Thyroid condition    Type 2 diabetes mellitus ([place])    Urolithiasis    Vaginal enterocele     Past Surgical History:  Procedure Laterality Date   CARPAL TUNNEL RELEASE  2018   CATARACT EXTRACTION, BILATERAL     CHOLECYSTECTOMY     COLONOSCOPY     07/11/2009, 02/2015   GALLBLADDER SURGERY     KNEE ARTHROSCOPY Right 05/20/2020   Procedure: RIGHT KNEE ARTHROSCOPY AND DEBRIDEMENT;  Surgeon: [name], MD;  [facility];  Service: Orthopedics;  Laterality: Right;   PARTIAL HYSTERECTOMY     Overies left intact   SQUAMOUS CELL CARCINOMA EXCISION  05/2018    Social History   Socioeconomic History   Marital  status: Married    Spouse name: Not on file   Number of children: Not on file   Years of education: Not on file   Highest education level: Not on file  Occupational History   Not on file  Tobacco Use   Smoking status: Former    Packs/day: 0.20    Years: 10.00    Pack years: 2.00    Types: Cigarettes    Quit date: 07/05/1978    Years since quitting: 43.0   Smokeless tobacco: Never  Vaping Use   Vaping Use: Never used  Substance and Sexual Activity   Alcohol use: No   Drug use: No   Sexual activity: Not Currently  Other Topics Concern   Not on file  Social History Narrative   Diet: Blank      Do you drink/ eat things with caffeine? Yes      Marital status:  Married                             What year were you married ? 196      Do you live in a house, apartment,assistred living, condo, trailer, etc.)? House      Is it one or more stories? 1  How many persons live in your home ? 2      Do you have any pets in your home ?(please list)  1 Sam-Dog      Highest Level of education completed: 12      Current or past profession: Office      Do you exercise?   Some                           Type & how often  Walk Daily      ADVANCED DIRECTIVES (Please bring copies)      Do you have a living will? Blank      Do you have a DNR form?  Blank                     If not, do you want to discuss one?       Do you have signed POA?HPOA forms?  Yes               If so, please bring to your appointment      FUNCTIONAL STATUS- To be completed by Spouse / child / Staff       Do you have difficulty bathing or dressing yourself ?  No      Do you have difficulty preparing food or eating ?  No      Do you have difficulty managing your mediation ?  No      Do you have difficulty managing your finances ?  No      Do you have difficulty affording your medication ?  Yes (Some)      Social Determinants of Health   Financial Resource Strain: Not on file  Food Insecurity: Not on  file  Transportation Needs: Not on file  Physical Activity: Not on file  Stress: Not on file  Social Connections: Not on file  Intimate Partner Violence: Not on file    Current Outpatient Medications on File Prior to Visit  Medication Sig Dispense Refill   Accu-Chek Softclix Lancets lancets Use to test blood sugar three times daily. Dx: E11.8 300 each 1   aspirin EC 81 MG tablet Take 81 mg by mouth daily.     chlorthalidone (HYGROTON) 25 MG tablet Take 1 tablet (25 mg total) by mouth daily. 90 tablet 3   Continuous Blood Gluc Sensor (FREESTYLE LIBRE 2 SENSOR) MISC 1 application by Does not apply route every 14 (fourteen) days. 2 each 3   CRANBERRY PO Take by mouth daily.     Diclofenac Sodium (VOLTAREN EX) Apply 1 application topically 3 (three) times daily as needed.     Dulaglutide (TRULICITY) 1.5 JS/2.8BT SOPN Inject 1.5 mg into the skin once a week. 6 mL 3   estradiol (ESTRACE) 0.1 MG/GM vaginal cream Place 1 Applicatorful vaginally 3 (three) times a week. 42.5 g 0   etanercept (ENBREL SURECLICK) 50 MG/ML injection Inject 50 mg into the skin once a week. 12 mL 0   fluocinonide (LIDEX) 0.05 % external solution Apply 1 application topically daily as needed.     glucose blood (ACCU-CHEK GUIDE) test strip USE TO CHECK BLOOD SUGAR THREE TIMES A DAY 300 strip 1   irbesartan (AVAPRO) 300 MG tablet Take 1 tablet (300 mg total) by mouth daily. 90 tablet 3   loperamide (IMODIUM A-D) 2 MG tablet Take 2 mg by mouth as needed for diarrhea or loose stools.  LORazepam (ATIVAN) 0.5 MG tablet Take 1 tablet (0.5 mg total) by mouth at bedtime. 30 tablet 3   MELATONIN PO Take 10 mg by mouth at bedtime as needed.     Omeprazole 20 MG TBDD Take 20 mg by mouth daily.     ondansetron (ZOFRAN) 4 MG tablet Take 4 mg by mouth 3 (three) times daily as needed for nausea or vomiting.     Probiotic Product (PROBIOTIC PO) Take by mouth daily.     simvastatin (ZOCOR) 20 MG tablet TAKE ONE TABLET BY MOUTH EVERY  EVENING 90 tablet 1   spironolactone (ALDACTONE) 25 MG tablet Take 1 tablet (25 mg total) by mouth daily. 90 tablet 3   No current facility-administered medications on file prior to visit.    Allergies  Allergen Reactions   Amaryl [Glimepiride]     Don't Remember   Amlodipine     LE edema on 7.5mg  daily. Tolerates 5mg  daily just fine   Codeine     SICK   Jardiance [Empagliflozin]     Urinary tract infection- SIDE EFFECT   Pneumovax 23 [Pneumococcal Vac Polyvalent]     Red / Swelling / Hot to touch SIDE EFFECTS   Wellbutrin [Bupropion]     Unknown   Latex Rash    Family History  Problem Relation Age of Onset   Diabetes Mother    Heart disease Father 58   Diabetes Son    Crohn's disease Son     BP (!) 170/70    Pulse 100    Ht 5\' 1"  (1.549 m)    Wt 158 lb 6.4 oz (71.8 kg)    SpO2 93%    BMI 29.93 kg/m    Review of Systems She has heartburn and nausea, but this is much better over the past week.  She has lost a few lbs.     Objective:   Physical Exam    Lab Results  Component Value Date   HGBA1C 8.6 (A) 07/14/2021      Assessment & Plan:  Insulin-requiring type 2 DM: uncontrolled  Patient Instructions  check your blood sugar twice a day.  vary the time of day when you check, between before the 3 meals, and at bedtime.  also check if you have symptoms of your blood sugar being too high or too low.  please keep a record of the readings and bring it to your next appointment here (or you can bring the meter itself).  You can write it on any piece of paper.  please call us sooner if your blood sugar goes below 70, or if most of your readings are over 200.   Please continue the same Trulicity, at least for now.  increase the insulin to 40 units with breakfast, and 25 units with supper.  Please come back for a follow-up appointment in 6 weeks.

## 2021-07-15 NOTE — Progress Notes (Signed)
Office Visit Note  Patient: Annette Suarez             Date of Birth: 1941-06-17           MRN: [id number]             PCP: [name], NP Referring: [name], NP Visit Date: 07/29/2021 Occupation: @GUAROCC @  Subjective:  Fatigue   History of Present Illness: Annette Suarez is a 81 y.o. female with history of seronegative rheumatoid arthritis, osteoarthritis, and osteopenia.  She is on Enbrel 50 mg subcutaneous injections once weekly.  She is tolerating Enbrel without any side effects or injection site reactions.  She has not missed any doses of Enbrel recently.  She has been experiencing increased pain and stiffness in both hands especially at night.  She denies any joint swelling.  She states she continues to have persistent pain in her right knee joint.  She is not able to proceed with a knee replacement until her hemoglobin A1c improves.  She has established care at [facility] and has had Visco gel injections in the past which were helpful.  She would like [place] to reapply for Visco gel injections for the right knee.  She also has ongoing discomfort on the lateral aspect of her right hip from trochanteric bursitis.  She is also been experiencing some increased pain and stiffness in her right shoulder over the past couple of months. She has persistent fatigue on a daily basis.  She continues to have interrupted sleep at night.  She denies any recent infections.      Activities of Daily Living:  Patient reports morning stiffness for 1-2  hours.   Patient Reports nocturnal pain.  Difficulty dressing/grooming: Denies Difficulty climbing stairs: Reports Difficulty getting out of chair: Denies Difficulty using hands for taps, buttons, cutlery, and/or writing: Reports  Review of Systems  Constitutional:  Positive for fatigue.  HENT:  Positive for mouth dryness and nose dryness. Negative for mouth sores.   Eyes:  Positive for dryness. Negative for pain, itching and visual  disturbance.  Respiratory:  Negative for cough, hemoptysis, shortness of breath and difficulty breathing.   Cardiovascular:  Negative for chest pain, palpitations, hypertension and swelling in legs/feet.  Gastrointestinal:  Negative for blood in stool, constipation and diarrhea.  Endocrine: Positive for increased urination.  Genitourinary:  Negative for painful urination.  Musculoskeletal:  Positive for morning stiffness. Negative for joint pain, joint pain, joint swelling, myalgias, muscle weakness, muscle tenderness and myalgias.  Skin:  Negative for color change, pallor, rash, hair loss, nodules/bumps, redness, skin tightness, ulcers and sensitivity to sunlight.  Allergic/Immunologic: Negative for susceptible to infections.  Neurological:  Negative for dizziness, numbness, headaches and weakness.  Hematological:  Positive for bruising/bleeding tendency. Negative for swollen glands.  Psychiatric/Behavioral:  Negative for depressed mood and sleep disturbance. The patient is not nervous/anxious.    PMFS History:  Patient Active Problem List   Diagnosis Date Noted   Subclavian arterial stenosis ([place]) 09/11/2020   Aortic atherosclerosis ([place]) 09/11/2020   Alteration in blood pressure 06/11/2020   Old complex tear of medial meniscus of right knee    Old complex tear of lateral meniscus of right knee    Primary osteoarthritis of both feet 12/13/2017   Dehydration 09/09/2017   Acute lower UTI 09/09/2017   Nausea & vomiting 09/08/2017   Primary osteoarthritis of both hands 04/05/2017   Left carpal tunnel syndrome 11/15/2016   Rheumatoid arthritis of multiple sites  with negative rheumatoid factor ([place]) 10/06/2016   High risk medication use 10/06/2016   Dyslipidemia 10/06/2016   Pain in both hands 09/08/2016   Hypertension associated with diabetes ([place]) 09/08/2016   Type 2 diabetes mellitus  09/08/2016   Gastroesophageal reflux disease without esophagitis 09/08/2016   Osteopenia of multiple  sites 09/08/2016   History of anxiety 09/08/2016   Benign paroxysmal positional vertigo 09/08/2016   Sleep apnea 09/08/2016    Past Medical History:  Diagnosis Date   Acute cystitis    UTI   Anxiety    Arthritis    Benign positional vertigo    Dermatitis    Diabetes mellitus without complication ([facility])    Family history of adverse reaction to anesthesia    GERD (gastroesophageal reflux disease)    Goiter, nontoxic, multinodular    Gout    H/O: hysterectomy    History of tobacco use    Hx laparoscopic cholecystectomy    Hypercholesterolemia    Hypertension    Incomplete bladder emptying    Insomnia    Postmenopausal atrophic vaginitis    RA (rheumatoid arthritis) ([place])    Rectocele    Renal calculus    Sleep apnea    Thyroid condition    Type 2 diabetes mellitus ([place])    Urolithiasis    Vaginal enterocele     Family History  Problem Relation Age of Onset   Diabetes Mother    Heart disease Father 48   Diabetes Son    Crohn's disease Son    Past Surgical History:  Procedure Laterality Date   CARPAL TUNNEL RELEASE  2018   CATARACT EXTRACTION, BILATERAL     CHOLECYSTECTOMY     COLONOSCOPY     07/11/2009, 02/2015   GALLBLADDER SURGERY     KNEE ARTHROSCOPY Right 05/20/2020   Procedure: RIGHT KNEE ARTHROSCOPY AND DEBRIDEMENT;  Surgeon: [name], MD;  Location: [facility];  Service: Orthopedics;  Laterality: Right;   PARTIAL HYSTERECTOMY     Overies left intact   SQUAMOUS CELL CARCINOMA EXCISION  05/2018   Social History   Social History Narrative   Diet: Blank      Do you drink/ eat things with caffeine? Yes      Marital status:  Married                             What year were you married ? 196      Do you live in a house, apartment,assistred living, condo, trailer, etc.)? House      Is it one or more stories? 1      How many persons live in your home ? 2      Do you have any pets in your home ?(please list)  1 Sam-Dog      Highest  Level of education completed: 12      Current or past profession: Office      Do you exercise?   Some                           Type & how often  Walk Daily      ADVANCED DIRECTIVES (Please bring copies)      Do you have a living will? Blank      Do you have a DNR form?  Blank  If not, do you want to discuss one?       Do you have signed POA?HPOA forms?  Yes               If so, please bring to your appointment      FUNCTIONAL STATUS- To be completed by Spouse / child / Staff       Do you have difficulty bathing or dressing yourself ?  No      Do you have difficulty preparing food or eating ?  No      Do you have difficulty managing your mediation ?  No      Do you have difficulty managing your finances ?  No      Do you have difficulty affording your medication ?  Yes (Some)      Immunization History  Administered Date(s) Administered   Fluad Quad(high Dose 65+) 04/09/2021   Influenza Split 03/28/2008, 03/05/2010, 04/17/2012, 04/20/2013   Influenza, High Dose Seasonal PF 02/08/2020   Influenza-Unspecified 05/11/2011, 04/22/2014, 04/15/2015, 02/20/2016, 03/02/2017, 03/09/2018, 03/20/2019, 03/21/2020   PFIZER(Purple Top)SARS-COV-2 Vaccination 07/24/2019, 08/06/2019, 08/27/2019, 04/21/2020   Pneumococcal Conjugate-13 05/02/2014   Pneumococcal Polysaccharide-23 04/23/2008   Tdap 05/01/2013   Zoster, Live 04/17/2012     Objective: Vital Signs: BP (!) 147/78 (BP Location: Right Arm, Patient Position: Sitting, Cuff Size: Normal)    Pulse 91    Ht 5\' 1"  (1.549 m)    Wt 158 lb 12.8 oz (72 kg)    BMI 30.00 kg/m    Physical Exam Vitals and nursing note reviewed.  Constitutional:      Appearance: She is well-developed.  HENT:     Head: Normocephalic and atraumatic.  Eyes:     Conjunctiva/sclera: Conjunctivae normal.  Pulmonary:     Effort: Pulmonary effort is normal.  Abdominal:     Palpations: Abdomen is soft.  Musculoskeletal:     Cervical back:  Normal range of motion.  Skin:    General: Skin is warm and dry.     Capillary Refill: Capillary refill takes less than 2 seconds.  Neurological:     Mental Status: She is alert and oriented to person, place, and time.  Psychiatric:        Behavior: Behavior normal.     Musculoskeletal Exam: C-spine has good range of motion with no discomfort.  She is some discomfort with range of motion of the right shoulder joint.  Left shoulder has full range of motion with no discomfort.  Elbow joints and wrist joints have good range of motion with no discomfort or tenderness.  No tenderness or synovitis over MCP joints.  PIP and DIP thickening consistent with osteoarthritis of both hands.  Hip joints have good range of motion with no groin pain.  She has some warmth and painful range of motion of the right knee joint.  Left knee joint has good range of motion with no warmth or effusion.  Ankle joints have good range of motion with no tenderness or joint swelling.  CDAI Exam: CDAI Score: 0.6  Patient Global: 3 mm; Provider Global: 3 mm Swollen: 0 ; Tender: 0  Joint Exam 07/29/2021   No joint exam has been documented for this visit   There is currently no information documented on the homunculus. Go to the Rheumatology activity and complete the homunculus joint exam.  Investigation: No additional findings.  Imaging: No results found.  Recent Labs: Lab Results  Component Value Date   WBC 9.5 04/09/2021  HGB 11.8 04/09/2021   PLT 335 04/09/2021   NA 137 04/29/2021   K 4.9 04/29/2021   CL 101 04/29/2021   CO2 26 04/29/2021   GLUCOSE 195 (H) 04/29/2021   BUN 28 (H) 04/29/2021   CREATININE 1.20 (H) 04/29/2021   BILITOT 1.1 04/29/2021   ALKPHOS 83 07/25/2019   AST 15 04/29/2021   ALT 12 04/29/2021   PROT 6.9 04/29/2021   ALBUMIN 4.6 07/25/2019   CALCIUM 9.5 04/29/2021   GFRAA 43 (L) 01/09/2021   QFTBGOLDPLUS NEGATIVE 07/14/2020    Speciality Comments: Recieves Enbrel through [facility] 02/20/2019 Osteopenia managed by Dr. Annice Pih 02/20/2019  Procedures:  No procedures performed Allergies: Amaryl [glimepiride], Amlodipine, Codeine, Jardiance [empagliflozin], Pneumovax 23 [pneumococcal vac polyvalent], Wellbutrin [bupropion], and Latex    Assessment / Plan:     Visit Diagnoses: Rheumatoid arthritis of multiple sites with negative rheumatoid factor ([place]): She has no synovitis on examination today.  She has not had any recent rheumatoid arthritis flares.  She has been experiencing some increased pain and stiffness in both hands due to underlying osteoarthritis.  Discussed the importance of joint protection and muscle strengthening.  She also has ongoing pain in the right knee joint but is not ready to proceed with a knee replacement at this time.  Updated x-rays of the right knee were obtained today and she would like to reapply for Visco gel injections for the right knee.  Overall her rheumatoid arthritis is well controlled on Enbrel 50 mg subcutaneous injections once weekly.  She is tolerating Enbrel without any side effects or injection site reactions.  She has not missed any doses recently.  She will remain on Enbrel as monotherapy.  She was advised to notify us if she develops increased joint pain or joint swelling.  She will follow-up in the office in 5 months.  High risk medication use - Enbrel 50 mg sq injections once weekly. (inadequate response to [place]). CMP updated on 04/29/2021.  CBC drawn on 04/09/2021.  She is due to update CBC and CMP today.  Orders were released.  Her next lab work will be due in April and every 3 months to monitor for toxicity.  Standing orders for CBC and CMP remain in place.  TB Gold negative on 07/14/2020.  Order for TB gold released today.- Plan: COMPLETE METABOLIC PANEL WITH GFR, CBC with Differential/Platelet, QuantiFERON-TB Gold Plus She has not had any recent infections. Discussed the importance of holding Enbrel if she  develops signs or symptoms of an infection and to resume once the infection has completely cleared.  Screening for tuberculosis -Order for TB gold released today.  Plan: QuantiFERON-TB Gold Plus  Chronic right shoulder pain - She presents today with increased pain and stiffness in the right shoulder joint for the past couple of months.  On examination she has good range of motion with some discomfort with full abduction and forward flexion.  X-rays of the right shoulder were obtained today.  Discussed the importance of trying to work on range of motion exercises.  She was given a handout of shoulder exercises to perform.  She declined a cortisone injection or physical therapy at this time.  She also declined referral to orthopedics.  Plan: XR Shoulder Right  Primary osteoarthritis of both hands: She has PIP and DIP thickening consistent with osteoarthritis of both hands.  She has been experiencing increased pain and stiffness in both hands especially at night.  She has not noticed any joint swelling.  On examination today no synovitis was noted.  She was able to make a complete fist bilaterally.  Discussed the importance of joint protection and muscle strengthening.  Chronic pain of right knee -She has ongoing pain in the right knee joint.  She has difficulty walking prolonged distances due to the severity of discomfort.  She also has difficulty climbing steps and rising from a seated position.  She has established care at [facility] but is not planning to proceed with a knee replacement at this time.  She needs her hemoglobin A1c to be better controlled prior to proceeding with a knee replacement in the future.  She has noticed some improvement in her right knee joint pain after undergoing Visco gel injections.  She would like to reapply for Visco gel injections for the right knee.  X-rays of the right knee were updated today.  She was given a handout of knee joint exercises.  Discussed the  importance of lower extremity muscle strengthening.  Plan: XR KNEE 3 VIEW RIGHT  This patient is diagnosed with osteoarthritis of the right knee.  Radiographs show evidence of joint space narrowing, osteophytes, subchondral sclerosis and/or subchondral cysts.  This patient has knee pain which interferes with functional and activities of daily living.    This patient has experienced inadequate response, adverse effects and/or intolerance with conservative treatments such as acetaminophen, NSAIDS, topical creams, physical therapy or regular exercise, knee bracing and/or weight loss.   This patient has experienced inadequate response or has a contraindication to intra articular steroid injections for at least 3 months.   This patient is not scheduled to have a total knee replacement within 6 months of starting treatment with viscosupplementation.  Primary osteoarthritis of both feet: She is not experiencing any increased discomfort in her feet at this time.  She is wearing proper fitting shoes.  Osteopenia of multiple sites - Managed by her PCP-DEXA 02/20/19.  Future order for DEXA is in place.  Nocturnal muscle cramps: Less frequent  Trochanteric bursitis, right hip - She presents today with ongoing discomfort on the lateral aspect of her right hip from trochanteric bursitis.  She was given a handout of exercises to perform.  She may benefit from physical therapy in the future if her symptoms persist or worsen.  Other medical conditions are listed as follows:   Gastroesophageal reflux disease without esophagitis  History of vertigo  Type 2 diabetes mellitus   History of sleep apnea  Essential hypertension  History of hyperlipidemia  History of anxiety  Former smoker    Orders: Orders Placed This Encounter  Procedures   XR KNEE 3 VIEW RIGHT   XR Shoulder Right   COMPLETE METABOLIC PANEL WITH GFR   CBC with Differential/Platelet   QuantiFERON-TB Gold Plus   No orders of  the defined types were placed in this encounter.     Follow-Up Instructions: Return in about 5 months (around 12/27/2021) for Rheumatoid arthritis.   Annette Neas, PA-C  Note - This record has been created using Dragon software.  Chart creation errors have been sought, but may not always  have been located. Such creation errors do not reflect on  the standard of medical care.

## 2021-07-16 ENCOUNTER — Encounter: Payer: Self-pay | Admitting: Orthopedic Surgery

## 2021-07-16 ENCOUNTER — Ambulatory Visit (INDEPENDENT_AMBULATORY_CARE_PROVIDER_SITE_OTHER): Payer: Medicare Other | Admitting: Orthopedic Surgery

## 2021-07-16 ENCOUNTER — Other Ambulatory Visit: Payer: Self-pay

## 2021-07-16 ENCOUNTER — Encounter (HOSPITAL_COMMUNITY): Payer: Medicare Other

## 2021-07-16 VITALS — BP 130/70 | HR 78 | Temp 97.7°F | Ht 61.0 in | Wt 158.0 lb

## 2021-07-16 DIAGNOSIS — Z794 Long term (current) use of insulin: Secondary | ICD-10-CM

## 2021-07-16 DIAGNOSIS — I152 Hypertension secondary to endocrine disorders: Secondary | ICD-10-CM | POA: Diagnosis not present

## 2021-07-16 DIAGNOSIS — G4709 Other insomnia: Secondary | ICD-10-CM | POA: Diagnosis not present

## 2021-07-16 DIAGNOSIS — N390 Urinary tract infection, site not specified: Secondary | ICD-10-CM | POA: Diagnosis not present

## 2021-07-16 DIAGNOSIS — E118 Type 2 diabetes mellitus with unspecified complications: Secondary | ICD-10-CM

## 2021-07-16 DIAGNOSIS — M0609 Rheumatoid arthritis without rheumatoid factor, multiple sites: Secondary | ICD-10-CM

## 2021-07-16 DIAGNOSIS — E042 Nontoxic multinodular goiter: Secondary | ICD-10-CM

## 2021-07-16 DIAGNOSIS — E785 Hyperlipidemia, unspecified: Secondary | ICD-10-CM | POA: Diagnosis not present

## 2021-07-16 DIAGNOSIS — E1169 Type 2 diabetes mellitus with other specified complication: Secondary | ICD-10-CM

## 2021-07-16 DIAGNOSIS — I700 Atherosclerosis of aorta: Secondary | ICD-10-CM

## 2021-07-16 DIAGNOSIS — E1159 Type 2 diabetes mellitus with other circulatory complications: Secondary | ICD-10-CM

## 2021-07-16 NOTE — Progress Notes (Signed)
Careteam: Patient Care Team: [name], NP as PCP - General (Adult Health Nurse Practitioner) Werner Lean, MD as PCP - Cardiology (Cardiology)  Seen by: Windell Moulding, AGNP-C  PLACE OF SERVICE:  [place] Directive information    Allergies  Allergen Reactions   Amaryl [Glimepiride]     Don't Remember   Amlodipine     LE edema on 7.5mg  daily. Tolerates 5mg  daily just fine   Codeine     SICK   Jardiance [Empagliflozin]     Urinary tract infection- SIDE EFFECT   Pneumovax 23 [Pneumococcal Vac Polyvalent]     Red / Swelling / Hot to touch SIDE EFFECTS   Wellbutrin [Bupropion]     Unknown   Latex Rash    Chief Complaint  Patient presents with   Medical Management of Chronic Issues    Patient presents today for a 3 month follow-up.   Quality Metric Gaps    Eye exam, zoster, COVID booster     HPI: Patient is a 81 y.o. female seen today for medical management of chronic conditions.   Not fasting for lab work.   She is followed by endocrinology for T2DM. She has been on Trulicity for a couple months. Reports feeling nauseous at beginning, but denies SE at this time. Insulin recently adjusted to 40 units in the AM and 25 units in PM. No recent hypoglycemic events. She is trying to get [employer], but insurance does not believe she uses glucometer enough. Will talk with endocrinology about it next visit. Diabetic eye exam next week with Dr. Marica Otter.   Sleeping well. No recent panic attacks. Uses ativan qhs.   Right knee continues to be painful. She got a second opinion with [employer]. She was told she needs a knee replacement. Cannot have surgery until A1c is reduced. Continues to use voltaren gel for pain.   Denies urinary issues today. Last UTI 04/2021, thought to be associated with hyperglycemia. Taking cranberry gummy daily and probiotic.   She does not want any more covid booster vaccines.   She does not want Shingrix  vaccine after education given.    Review of Systems:  Review of Systems  Constitutional:  Negative for chills, fever, malaise/fatigue and weight loss.  HENT:  Negative for hearing loss and sore throat.   Eyes:  Negative for blurred vision and double vision.  Respiratory:  Negative for cough, shortness of breath and wheezing.   Cardiovascular:  Negative for chest pain and leg swelling.  Gastrointestinal:  Negative for abdominal pain, blood in stool, constipation, diarrhea, heartburn, nausea and vomiting.  Genitourinary:  Negative for dysuria, frequency and hematuria.  Musculoskeletal:  Positive for joint pain. Negative for falls.  Skin:  Negative for rash.  Neurological:  Negative for dizziness, weakness and headaches.  Psychiatric/Behavioral:  Negative for depression and memory loss. The patient has insomnia. The patient is not nervous/anxious.    Past Medical History:  Diagnosis Date   Acute cystitis    UTI   Anxiety    Arthritis    Benign positional vertigo    Dermatitis    Diabetes mellitus without complication ([place])    Family history of adverse reaction to anesthesia    GERD (gastroesophageal reflux disease)    Goiter, nontoxic, multinodular    Gout    H/O: hysterectomy    History of tobacco use    Hx laparoscopic cholecystectomy    Hypercholesterolemia    Hypertension  Incomplete bladder emptying    Insomnia    Postmenopausal atrophic vaginitis    RA (rheumatoid arthritis) ([place])    Rectocele    Renal calculus    Sleep apnea    Thyroid condition    Type 2 diabetes mellitus ([place])    Urolithiasis    Vaginal enterocele    Past Surgical History:  Procedure Laterality Date   CARPAL TUNNEL RELEASE  2018   CATARACT EXTRACTION, BILATERAL     CHOLECYSTECTOMY     COLONOSCOPY     07/11/2009, 02/2015   GALLBLADDER SURGERY     KNEE ARTHROSCOPY Right 05/20/2020   Procedure: RIGHT KNEE ARTHROSCOPY AND DEBRIDEMENT;  Surgeon: [name], MD;  Location: [facility];  Service: Orthopedics;  Laterality: Right;   PARTIAL HYSTERECTOMY     [place]ies left intact   SQUAMOUS CELL CARCINOMA EXCISION  05/2018   Social History:   reports that she quit smoking about 43 years ago. Her smoking use included cigarettes. She has a 2.00 pack-year smoking history. She has never used smokeless tobacco. She reports that she does not drink alcohol and does not use drugs.  Family History  Problem Relation Age of Onset   Diabetes Mother    Heart disease Father 50   Diabetes Son    Crohn's disease Son     Medications: Patient's Medications  New Prescriptions   No medications on file  Previous Medications   ACCU-CHEK SOFTCLIX LANCETS LANCETS    Use to test blood sugar three times daily. Dx: E11.8   ASPIRIN EC 81 MG TABLET    Take 81 mg by mouth daily.   CHLORTHALIDONE (HYGROTON) 25 MG TABLET    Take 1 tablet (25 mg total) by mouth daily.   CONTINUOUS BLOOD GLUC SENSOR (FREESTYLE LIBRE 2 SENSOR) MISC    1 application by Does not apply route every 14 (fourteen) days.   CRANBERRY PO    Take by mouth daily.   DICLOFENAC SODIUM (VOLTAREN EX)    Apply 1 application topically 3 (three) times daily as needed.   DULAGLUTIDE (TRULICITY) 1.5 BM/8.4XL SOPN    Inject 1.5 mg into the skin once a week.   ESTRADIOL (ESTRACE) 0.1 MG/GM VAGINAL CREAM    Place 1 Applicatorful vaginally 3 (three) times a week.   ETANERCEPT (ENBREL SURECLICK) 50 MG/ML INJECTION    Inject 50 mg into the skin once a week.   FLUOCINONIDE (LIDEX) 0.05 % EXTERNAL SOLUTION    Apply 1 application topically daily as needed.   GLUCOSE BLOOD (ACCU-CHEK GUIDE) TEST STRIP    USE TO CHECK BLOOD SUGAR THREE TIMES A DAY   INSULIN NPH HUMAN (HUMULIN N) 100 UNIT/ML INJECTION    40 units each morning, and 25 units each evening, and syringes 2/day.   IRBESARTAN (AVAPRO) 300 MG TABLET    Take 1 tablet (300 mg total) by mouth daily.   LOPERAMIDE (IMODIUM A-D) 2 MG TABLET    Take 2 mg by mouth as needed for diarrhea or  loose stools.   LORAZEPAM (ATIVAN) 0.5 MG TABLET    Take 1 tablet (0.5 mg total) by mouth at bedtime.   MELATONIN PO    Take 10 mg by mouth at bedtime as needed.   OMEPRAZOLE 20 MG TBDD    Take 20 mg by mouth daily.   ONDANSETRON (ZOFRAN) 4 MG TABLET    Take 4 mg by mouth 3 (three) times daily as needed for nausea or vomiting.   PROBIOTIC  PRODUCT (PROBIOTIC PO)    Take by mouth daily.   SIMVASTATIN (ZOCOR) 20 MG TABLET    TAKE ONE TABLET BY MOUTH EVERY EVENING   SPIRONOLACTONE (ALDACTONE) 25 MG TABLET    Take 1 tablet (25 mg total) by mouth daily.  Modified Medications   No medications on file  Discontinued Medications   No medications on file    Physical Exam:  Vitals:   07/16/21 1128  BP: 130/70  Pulse: 78  Temp: 97.7 F (36.5 C)  SpO2: 98%  Weight: 158 lb (71.7 kg)  Height: 5\' 1"  (1.549 m)   Body mass index is 29.85 kg/m. Wt Readings from Last 3 Encounters:  07/16/21 158 lb (71.7 kg)  07/14/21 158 lb 6.4 oz (71.8 kg)  06/10/21 158 lb (71.7 kg)    Physical Exam Vitals reviewed.  Constitutional:      General: She is not in acute distress. HENT:     Head: Normocephalic.  Eyes:     General:        Right eye: No discharge.        Left eye: No discharge.  Neck:     Thyroid: No thyroid mass or thyromegaly.     Vascular: No carotid bruit.  Cardiovascular:     Rate and Rhythm: Normal rate and regular rhythm.     Pulses: Normal pulses.     Heart sounds: Normal heart sounds. No murmur heard. Pulmonary:     Effort: Pulmonary effort is normal. No respiratory distress.     Breath sounds: Normal breath sounds. No wheezing.  Abdominal:     General: Bowel sounds are normal. There is no distension.     Palpations: Abdomen is soft.     Tenderness: There is no abdominal tenderness.  Musculoskeletal:     Cervical back: Normal range of motion.     Right knee: No swelling, deformity, erythema or crepitus. Decreased range of motion.     Right lower leg: No edema.     Left  lower leg: No edema.  Lymphadenopathy:     Cervical: No cervical adenopathy.  Skin:    General: Skin is warm and dry.     Capillary Refill: Capillary refill takes less than 2 seconds.  Neurological:     General: No focal deficit present.     Mental Status: She is alert and oriented to person, place, and time.  Psychiatric:        Mood and Affect: Mood normal.        Behavior: Behavior normal.     Labs reviewed: Basic Metabolic Panel: Recent Labs    01/14/21 1247 02/11/21 1418 04/09/21 1138 04/29/21 1049  NA 136 133*  --  137  K 4.9 4.4  --  4.9  CL 100 99  --  101  CO2 23 24  --  26  GLUCOSE 241* 291*  --  195*  BUN 28* 28*  --  28*  CREATININE 1.37* 1.17*  --  1.20*  CALCIUM 9.0 9.4  --  9.5  MG  --  1.6  --   --   TSH  --   --  1.81  --    Liver Function Tests: Recent Labs    01/09/21 1337 02/11/21 1418 04/29/21 1049  AST 14 12 15   ALT 10 11 12   BILITOT 1.0 0.9 1.1  PROT 6.6 7.2 6.9   No results for input(s): LIPASE, AMYLASE in the last 8760 hours. No results for input(s): AMMONIA in  the last 8760 hours. CBC: Recent Labs    10/08/20 1341 01/09/21 1337 04/09/21 1138  WBC 11.1* 11.7* 9.5  NEUTROABS 7,637 7,757 6,356  HGB 12.6 12.8 11.8  HCT 37.9 37.6 36.2  MCV 92.2 93.1 97.8  PLT 310 347 335   Lipid Panel: No results for input(s): CHOL, HDL, LDLCALC, TRIG, CHOLHDL, LDLDIRECT in the last 8760 hours. TSH: Recent Labs    04/09/21 1138  TSH 1.81   A1C: Lab Results  Component Value Date   HGBA1C 8.6 (A) 07/14/2021     Assessment/Plan 1. Type 2 diabetes mellitus with complication, with long-term current use of insulin ([place]) - followed by endocrinology - A1c 8.6 07/14/2021 - cont insulin NPH- 40 units qam and 25 units qpm - cont Trulicity - diabetic eye exam next week  2. Hyperlipidemia associated with type 2 diabetes mellitus ([place]) - not fasting today - LDL 55 01/22/2020 - cont statin - lipid panel- future  3. Hypertension associated  with diabetes ([place]) - controlled - BUN/creat 28/1.20 04/29/2021 - cont irbesartan  4. Other insomnia - sleeping well - cont ativan  5. Rheumatoid arthritis of multiple sites with negative rheumatoid factor ([place]) - followed by Dr.Deveshwar - cont Enbrel  6. Recurrent UTI - followed by urology - suspect related to glycemic control - last UTI 04/2021 - cont cranberry supplement for prevention - cont probiotic  7. Multinodular thyroid - hx thyroid nodules - U/S x 5 years- no growth - TSH 1.81 04/09/2021  8. Aortic atherosclerosis ([facility]) - cont asa and statin  Total time: 36 minutes. Greater than 50% of total time spent doing patient education on health maintenance, vaccines, lab work, and [employer].    Next appt: Visit date not found  [place] [phone]

## 2021-07-16 NOTE — Patient Instructions (Signed)
Need fasting lab work to check cholesterol- may ask Dr. Kathi Ludwig or schedule lab visit at our office

## 2021-07-17 ENCOUNTER — Ambulatory Visit: Payer: Medicare Other | Admitting: Internal Medicine

## 2021-07-23 DIAGNOSIS — Z961 Presence of intraocular lens: Secondary | ICD-10-CM | POA: Diagnosis not present

## 2021-07-23 DIAGNOSIS — H43393 Other vitreous opacities, bilateral: Secondary | ICD-10-CM | POA: Diagnosis not present

## 2021-07-23 DIAGNOSIS — H43813 Vitreous degeneration, bilateral: Secondary | ICD-10-CM | POA: Diagnosis not present

## 2021-07-23 DIAGNOSIS — I10 Essential (primary) hypertension: Secondary | ICD-10-CM | POA: Diagnosis not present

## 2021-07-23 DIAGNOSIS — H35363 Drusen (degenerative) of macula, bilateral: Secondary | ICD-10-CM | POA: Diagnosis not present

## 2021-07-23 DIAGNOSIS — H5203 Hypermetropia, bilateral: Secondary | ICD-10-CM | POA: Diagnosis not present

## 2021-07-23 DIAGNOSIS — H524 Presbyopia: Secondary | ICD-10-CM | POA: Diagnosis not present

## 2021-07-23 DIAGNOSIS — H52223 Regular astigmatism, bilateral: Secondary | ICD-10-CM | POA: Diagnosis not present

## 2021-07-29 ENCOUNTER — Other Ambulatory Visit: Payer: Self-pay

## 2021-07-29 ENCOUNTER — Ambulatory Visit: Payer: Self-pay

## 2021-07-29 ENCOUNTER — Ambulatory Visit: Payer: Medicare Other | Admitting: Physician Assistant

## 2021-07-29 ENCOUNTER — Telehealth: Payer: Self-pay

## 2021-07-29 ENCOUNTER — Encounter: Payer: Self-pay | Admitting: Physician Assistant

## 2021-07-29 VITALS — BP 147/78 | HR 91 | Ht 61.0 in | Wt 158.8 lb

## 2021-07-29 DIAGNOSIS — M19041 Primary osteoarthritis, right hand: Secondary | ICD-10-CM | POA: Diagnosis not present

## 2021-07-29 DIAGNOSIS — M0609 Rheumatoid arthritis without rheumatoid factor, multiple sites: Secondary | ICD-10-CM | POA: Diagnosis not present

## 2021-07-29 DIAGNOSIS — Z8639 Personal history of other endocrine, nutritional and metabolic disease: Secondary | ICD-10-CM

## 2021-07-29 DIAGNOSIS — M19071 Primary osteoarthritis, right ankle and foot: Secondary | ICD-10-CM | POA: Diagnosis not present

## 2021-07-29 DIAGNOSIS — M25511 Pain in right shoulder: Secondary | ICD-10-CM | POA: Diagnosis not present

## 2021-07-29 DIAGNOSIS — M8589 Other specified disorders of bone density and structure, multiple sites: Secondary | ICD-10-CM | POA: Diagnosis not present

## 2021-07-29 DIAGNOSIS — K219 Gastro-esophageal reflux disease without esophagitis: Secondary | ICD-10-CM | POA: Diagnosis not present

## 2021-07-29 DIAGNOSIS — M7061 Trochanteric bursitis, right hip: Secondary | ICD-10-CM | POA: Diagnosis not present

## 2021-07-29 DIAGNOSIS — Z87898 Personal history of other specified conditions: Secondary | ICD-10-CM | POA: Diagnosis not present

## 2021-07-29 DIAGNOSIS — Z794 Long term (current) use of insulin: Secondary | ICD-10-CM

## 2021-07-29 DIAGNOSIS — E118 Type 2 diabetes mellitus with unspecified complications: Secondary | ICD-10-CM

## 2021-07-29 DIAGNOSIS — Z8669 Personal history of other diseases of the nervous system and sense organs: Secondary | ICD-10-CM

## 2021-07-29 DIAGNOSIS — M25561 Pain in right knee: Secondary | ICD-10-CM

## 2021-07-29 DIAGNOSIS — M19072 Primary osteoarthritis, left ankle and foot: Secondary | ICD-10-CM

## 2021-07-29 DIAGNOSIS — Z79899 Other long term (current) drug therapy: Secondary | ICD-10-CM | POA: Diagnosis not present

## 2021-07-29 DIAGNOSIS — Z8659 Personal history of other mental and behavioral disorders: Secondary | ICD-10-CM

## 2021-07-29 DIAGNOSIS — R252 Cramp and spasm: Secondary | ICD-10-CM | POA: Diagnosis not present

## 2021-07-29 DIAGNOSIS — Z111 Encounter for screening for respiratory tuberculosis: Secondary | ICD-10-CM

## 2021-07-29 DIAGNOSIS — I10 Essential (primary) hypertension: Secondary | ICD-10-CM

## 2021-07-29 DIAGNOSIS — G8929 Other chronic pain: Secondary | ICD-10-CM

## 2021-07-29 DIAGNOSIS — M19042 Primary osteoarthritis, left hand: Secondary | ICD-10-CM

## 2021-07-29 DIAGNOSIS — Z87891 Personal history of nicotine dependence: Secondary | ICD-10-CM

## 2021-07-29 NOTE — Progress Notes (Signed)
X-rays of the right shoulder are consistent with degenerative changes of the [facility] joint.  Please notify the patient.

## 2021-07-29 NOTE — Progress Notes (Signed)
X-rays of the right knee are consistent with severe osteoarthritis, moderate chondromalacia patella, and chondrocalcinosis.   Please apply for visco gel injections for the right knee.

## 2021-07-29 NOTE — Telephone Encounter (Signed)
VOB submitted for Synvisc - right knee BV pending

## 2021-07-29 NOTE — Telephone Encounter (Signed)
Please apply for right knee visco, per Taylor Dale, PA-C. Thanks!  

## 2021-07-29 NOTE — Patient Instructions (Addendum)
Standing Labs We placed an order today for your standing lab work.   Please have your standing labs drawn in April and every 3 months   If possible, please have your labs drawn 2 weeks prior to your appointment so that the provider can discuss your results at your appointment.  Please note that you may see your imaging and lab results in [facility] before we have reviewed them. We may be awaiting multiple results to interpret others before contacting you. Please allow our office up to 72 hours to thoroughly review all of the results before contacting the office for clarification of your results.  We have open lab daily: Monday through Thursday from 1:30-4:30 PM and Friday from 1:30-4:00 PM at the office of Dr. Bo Merino, [facility].   Please be advised, all patients with office appointments requiring lab work will take precedent over walk-in lab work.  If possible, please come for your lab work on Monday and Friday afternoons, as you may experience shorter wait times. The office is located at [address] [place], [place], [place] [address] No appointment is necessary.   Labs are drawn by [facility]. Please bring your co-pay at the time of your lab draw.  You may receive a bill from [facility] for your lab work.  Please note if you are on Hydroxychloroquine and and an order has been placed for a Hydroxychloroquine level, you will need to have it drawn 4 hours or more after your last dose.  If you wish to have your labs drawn at another location, please call the office 24 hours in advance to send orders.  If you have any questions regarding directions or hours of operation,  please call [phone].   As a reminder, please drink plenty of water prior to coming for your lab work. Thanks!   Hip Bursitis Rehab Ask your health care provider which exercises are safe for you. Do exercises exactly as told by your health care provider and adjust them as directed. It is normal to  feel mild stretching, pulling, tightness, or discomfort as you do these exercises. Stop right away if you feel sudden pain or your pain gets worse. Do not begin these exercises until told by your health care provider. Stretching exercise This exercise warms up your muscles and joints and improves the movement and flexibility of your hip. This exercise also helps to relieve pain and stiffness. Iliotibial band stretch An iliotibial band is a strong band of muscle tissue that runs from the outer side of your hip to the outer side of your thigh and knee. Lie on your side with your left / right leg in the top position. Bend your left / right knee and grab your ankle. Stretch out your bottom arm to help you balance. Slowly bring your knee back so your thigh is behind your body. Slowly lower your knee toward the floor until you feel a gentle stretch on the outside of your left / right thigh. If you do not feel a stretch and your knee will not fall farther, place the heel of your other foot on top of your knee and pull your knee down toward the floor with your foot. Hold this position for __________ seconds. Slowly return to the starting position. Repeat __________ times. Complete this exercise __________ times a day. Strengthening exercises These exercises build strength and endurance in your hip and pelvis. Endurance is the ability to use your muscles for a long time, even after they get  tired. Bridge This exercise strengthens the muscles that move your thigh backward (hip extensors). Lie on your back on a firm surface with your knees bent and your feet flat on the floor. Tighten your buttocks muscles and lift your buttocks off the floor until your trunk is level with your thighs. Do not arch your back. You should feel the muscles working in your buttocks and the back of your thighs. If you do not feel these muscles, slide your feet 1-2 inches (2.5-5 cm) farther away from your buttocks. If this  exercise is too easy, try doing it with your arms crossed over your chest. Hold this position for __________ seconds. Slowly lower your hips to the starting position. Let your muscles relax completely after each repetition. Repeat __________ times. Complete this exercise __________ times a day. Squats This exercise strengthens the muscles in front of your thigh and knee (quadriceps). Stand in front of a table, with your feet and knees pointing straight ahead. You may rest your hands on the table for balance but not for support. Slowly bend your knees and lower your hips like you are going to sit in a chair. Keep your weight over your heels, not over your toes. Keep your lower legs upright so they are parallel with the table legs. Do not let your hips go lower than your knees. Do not bend lower than told by your health care provider. If your hip pain increases, do not bend as low. Hold the squat position for __________ seconds. Slowly push with your legs to return to standing. Do not use your hands to pull yourself to standing. Repeat __________ times. Complete this exercise __________ times a day. Hip hike Stand sideways on a bottom step. Stand on your left / right leg with your other foot unsupported next to the step. You can hold on to the railing or wall for balance if needed. Keep your knees straight and your torso square. Then lift your left / right hip up toward the ceiling. Hold this position for __________ seconds. Slowly let your left / right hip lower toward the floor, past the starting position. Your foot should get closer to the floor. Do not lean or bend your knees. Repeat __________ times. Complete this exercise __________ times a day. Single leg stand Without shoes, stand near a railing or in a doorway. You may hold on to the railing or door frame as needed for balance. Squeeze your left / right buttock muscles, then lift up your other foot. Do not let your left / right hip  push out to the side. It is helpful to stand in front of a mirror for this exercise so you can watch your hip. Hold this position for __________ seconds. Repeat __________ times. Complete this exercise __________ times a day. This information is not intended to replace advice given to you by your health care provider. Make sure you discuss any questions you have with your health care provider. Document Revised: 10/16/2018 Document Reviewed: 10/16/2018 Elsevier Patient Education  [place].  Shoulder Exercises Ask your health care provider which exercises are safe for you. Do exercises exactly as told by your health care provider and adjust them as directed. It is normal to feel mild stretching, pulling, tightness, or discomfort as you do these exercises. Stop right away if you feel sudden pain or your pain gets worse. Do not begin these exercises until told by your health care provider. Stretching exercises External rotation and abduction This exercise  is sometimes called corner stretch. This exercise rotates your arm outward (external rotation) and moves your arm out from your body (abduction). Stand in a doorway with one of your feet slightly in front of the other. This is called a staggered stance. If you cannot reach your forearms to the door frame, stand facing a corner of a room. Choose one of the following positions as told by your health care provider: Place your hands and forearms on the door frame above your head. Place your hands and forearms on the door frame at the height of your head. Place your hands on the door frame at the height of your elbows. Slowly move your weight onto your front foot until you feel a stretch across your chest and in the front of your shoulders. Keep your head and chest upright and keep your abdominal muscles tight. Hold for __________ seconds. To release the stretch, shift your weight to your back foot. Repeat __________ times. Complete this  exercise __________ times a day. Extension, standing Stand and hold a broomstick, a cane, or a similar object behind your back. Your hands should be a little wider than shoulder width apart. Your palms should face away from your back. Keeping your elbows straight and your shoulder muscles relaxed, move the stick away from your body until you feel a stretch in your shoulders (extension). Avoid shrugging your shoulders while you move the stick. Keep your shoulder blades tucked down toward the middle of your back. Hold for __________ seconds. Slowly return to the starting position. Repeat __________ times. Complete this exercise __________ times a day. Range-of-motion exercises Pendulum  Stand near a wall or a surface that you can hold onto for balance. Bend at the waist and let your left / right arm hang straight down. Use your other arm to support you. Keep your back straight and do not lock your knees. Relax your left / right arm and shoulder muscles, and move your hips and your trunk so your left / right arm swings freely. Your arm should swing because of the motion of your body, not because you are using your arm or shoulder muscles. Keep moving your hips and trunk so your arm swings in the following directions, as told by your health care provider: Side to side. Forward and backward. In clockwise and counterclockwise circles. Continue each motion for __________ seconds, or for as long as told by your health care provider. Slowly return to the starting position. Repeat __________ times. Complete this exercise __________ times a day. Shoulder flexion, standing  Stand and hold a broomstick, a cane, or a similar object. Place your hands a little more than shoulder width apart on the object. Your left / right hand should be palm up, and your other hand should be palm down. Keep your elbow straight and your shoulder muscles relaxed. Push the stick up with your healthy arm to raise your left /  right arm in front of your body, and then over your head until you feel a stretch in your shoulder (flexion). Avoid shrugging your shoulder while you raise your arm. Keep your shoulder blade tucked down toward the middle of your back. Hold for __________ seconds. Slowly return to the starting position. Repeat __________ times. Complete this exercise __________ times a day. Shoulder abduction, standing Stand and hold a broomstick, a cane, or a similar object. Place your hands a little more than shoulder width apart on the object. Your left / right hand should be palm up,  and your other hand should be palm down. Keep your elbow straight and your shoulder muscles relaxed. Push the object across your body toward your left / right side. Raise your left / right arm to the side of your body (abduction) until you feel a stretch in your shoulder. Do not raise your arm above shoulder height unless your health care provider tells you to do that. If directed, raise your arm over your head. Avoid shrugging your shoulder while you raise your arm. Keep your shoulder blade tucked down toward the middle of your back. Hold for __________ seconds. Slowly return to the starting position. Repeat __________ times. Complete this exercise __________ times a day. Internal rotation  Place your left / right hand behind your back, palm up. Use your other hand to dangle an exercise band, a towel, or a similar object over your shoulder. Grasp the band with your left / right hand so you are holding on to both ends. Gently pull up on the band until you feel a stretch in the front of your left / right shoulder. The movement of your arm toward the center of your body is called internal rotation. Avoid shrugging your shoulder while you raise your arm. Keep your shoulder blade tucked down toward the middle of your back. Hold for __________ seconds. Release the stretch by letting go of the band and lowering your hands. Repeat  __________ times. Complete this exercise __________ times a day. Strengthening exercises External rotation  Sit in a stable chair without armrests. Secure an exercise band to a stable object at elbow height on your left / right side. Place a soft object, such as a folded towel or a small pillow, between your left / right upper arm and your body to move your elbow about 4 inches (10 cm) away from your side. Hold the end of the exercise band so it is tight and there is no slack. Keeping your elbow pressed against the soft object, slowly move your forearm out, away from your abdomen (external rotation). Keep your body steady so only your forearm moves. Hold for __________ seconds. Slowly return to the starting position. Repeat __________ times. Complete this exercise __________ times a day. Shoulder abduction  Sit in a stable chair without armrests, or stand up. Hold a __________ weight in your left / right hand, or hold an exercise band with both hands. Start with your arms straight down and your left / right palm facing in, toward your body. Slowly lift your left / right hand out to your side (abduction). Do not lift your hand above shoulder height unless your health care provider tells you that this is safe. Keep your arms straight. Avoid shrugging your shoulder while you do this movement. Keep your shoulder blade tucked down toward the middle of your back. Hold for __________ seconds. Slowly lower your arm, and return to the starting position. Repeat __________ times. Complete this exercise __________ times a day. Shoulder extension Sit in a stable chair without armrests, or stand up. Secure an exercise band to a stable object in front of you so it is at shoulder height. Hold one end of the exercise band in each hand. Your palms should face each other. Straighten your elbows and lift your hands up to shoulder height. Step back, away from the secured end of the exercise band, until the  band is tight and there is no slack. Squeeze your shoulder blades together as you pull your hands down to the sides  of your thighs (extension). Stop when your hands are straight down by your sides. Do not let your hands go behind your body. Hold for __________ seconds. Slowly return to the starting position. Repeat __________ times. Complete this exercise __________ times a day. Shoulder row Sit in a stable chair without armrests, or stand up. Secure an exercise band to a stable object in front of you so it is at waist height. Hold one end of the exercise band in each hand. Position your palms so that your thumbs are facing the ceiling (neutral position). Bend each of your elbows to a 90-degree angle (right angle) and keep your upper arms at your sides. Step back until the band is tight and there is no slack. Slowly pull your elbows back behind you. Hold for __________ seconds. Slowly return to the starting position. Repeat __________ times. Complete this exercise __________ times a day. Shoulder press-ups  Sit in a stable chair that has armrests. Sit upright, with your feet flat on the floor. Put your hands on the armrests so your elbows are bent and your fingers are pointing forward. Your hands should be about even with the sides of your body. Push down on the armrests and use your arms to lift yourself off the chair. Straighten your elbows and lift yourself up as much as you comfortably can. Move your shoulder blades down, and avoid letting your shoulders move up toward your ears. Keep your feet on the ground. As you get stronger, your feet should support less of your body weight as you lift yourself up. Hold for __________ seconds. Slowly lower yourself back into the chair. Repeat __________ times. Complete this exercise __________ times a day. Wall push-ups  Stand so you are facing a stable wall. Your feet should be about one arm-length away from the wall. Lean forward and place your  palms on the wall at shoulder height. Keep your feet flat on the floor as you bend your elbows and lean forward toward the wall. Hold for __________ seconds. Straighten your elbows to push yourself back to the starting position. Repeat __________ times. Complete this exercise __________ times a day. This information is not intended to replace advice given to you by your health care provider. Make sure you discuss any questions you have with your health care provider. Document Revised: 10/13/2018 Document Reviewed: 07/21/2018 Elsevier Patient Education  [place].   Knee Exercises Ask your health care provider which exercises are safe for you. Do exercises exactly as told by your health care provider and adjust them as directed. It is normal to feel mild stretching, pulling, tightness, or discomfort as you do these exercises. Stop right away if you feel sudden pain or your pain gets worse. Do not begin these exercises until told by your health care provider. Stretching and range-of-motion exercises These exercises warm up your muscles and joints and improve the movement and flexibility of your knee. These exercises also help to relieve pain and swelling. Knee extension, prone  Lie on your abdomen (prone position) on a bed. Place your left / right knee just beyond the edge of the surface so your knee is not on the bed. You can put a towel under your left / right thigh just above your kneecap for comfort. Relax your leg muscles and allow gravity to straighten your knee (extension). You should feel a stretch behind your left / right knee. Hold this position for __________ seconds. Scoot up so your knee is supported between  repetitions. Repeat __________ times. Complete this exercise __________ times a day. Knee flexion, active  Lie on your back with both legs straight. If this causes back discomfort, bend your left / right knee so your foot is flat on the floor. Slowly slide your left /  right heel back toward your buttocks. Stop when you feel a gentle stretch in the front of your knee or thigh (flexion). Hold this position for __________ seconds. Slowly slide your left / right heel back to the starting position. Repeat __________ times. Complete this exercise __________ times a day. Quadriceps stretch, prone  Lie on your abdomen on a firm surface, such as a bed or padded floor. Bend your left / right knee and hold your ankle. If you cannot reach your ankle or pant leg, loop a belt around your foot and grab the belt instead. Gently pull your heel toward your buttocks. Your knee should not slide out to the side. You should feel a stretch in the front of your thigh and knee (quadriceps). Hold this position for __________ seconds. Repeat __________ times. Complete this exercise __________ times a day. Hamstring, supine  Lie on your back (supine position). Loop a belt or towel over the ball of your left / right foot. The ball of your foot is on the walking surface, right under your toes. Straighten your left / right knee and slowly pull on the belt to raise your leg until you feel a gentle stretch behind your knee (hamstring). Do not let your knee bend while you do this. Keep your other leg flat on the floor. Hold this position for __________ seconds. Repeat __________ times. Complete this exercise __________ times a day. Strengthening exercises These exercises build strength and endurance in your knee. Endurance is the ability to use your muscles for a long time, even after they get tired. Quadriceps, isometric This exercise strengthens the muscles in front of your thigh (quadriceps) without moving your knee joint (isometric). Lie on your back with your left / right leg extended and your other knee bent. Put a rolled towel or small pillow under your knee if told by your health care provider. Slowly tense the muscles in the front of your left / right thigh. You should see your  kneecap slide up toward your hip or see increased dimpling just above the knee. This motion will push the back of the knee toward the floor. For __________ seconds, hold the muscle as tight as you can without increasing your pain. Relax the muscles slowly and completely. Repeat __________ times. Complete this exercise __________ times a day. Straight leg raises This exercise strengthens the muscles in front of your thigh (quadriceps) and the muscles that move your hips (hip flexors). Lie on your back with your left / right leg extended and your other knee bent. Tense the muscles in the front of your left / right thigh. You should see your kneecap slide up or see increased dimpling just above the knee. Your thigh may even shake a bit. Keep these muscles tight as you raise your leg 4-6 inches (10-15 cm) off the floor. Do not let your knee bend. Hold this position for __________ seconds. Keep these muscles tense as you lower your leg. Relax your muscles slowly and completely after each repetition. Repeat __________ times. Complete this exercise __________ times a day. Hamstring, isometric  Lie on your back on a firm surface. Bend your left / right knee about __________ degrees. Dig your left / right heel  into the surface as if you are trying to pull it toward your buttocks. Tighten the muscles in the back of your thighs (hamstring) to "dig" as hard as you can without increasing any pain. Hold this position for __________ seconds. Release the tension gradually and allow your muscles to relax completely for __________ seconds after each repetition. Repeat __________ times. Complete this exercise __________ times a day. Hamstring curls If told by your health care provider, do this exercise while wearing ankle weights. Begin with __________lb / kg weights. Then increase the weight by 1 lb (0.5 kg) increments. Do not wear ankle weights that are more than __________lb / kg. Lie on your abdomen with  your legs straight. Bend your left / right knee as far as you can without feeling pain. Keep your hips flat against the floor. Hold this position for __________ seconds. Slowly lower your leg to the starting position. Repeat __________ times. Complete this exercise __________ times a day. Squats This exercise strengthens the muscles in front of your thigh and knee (quadriceps). Stand in front of a table, with your feet and knees pointing straight ahead. You may rest your hands on the table for balance but not for support. Slowly bend your knees and lower your hips like you are going to sit in a chair. Keep your weight over your heels, not over your toes. Keep your lower legs upright so they are parallel with the table legs. Do not let your hips go lower than your knees. Do not bend lower than told by your health care provider. If your knee pain increases, do not bend as low. Hold the squat position for __________ seconds. Slowly push with your legs to return to standing. Do not use your hands to pull yourself to standing. Repeat __________ times. Complete this exercise __________ times a day. Wall slides This exercise strengthens the muscles in front of your thigh and knee (quadriceps). Lean your back against a smooth wall or door, and walk your feet out 18-24 inches (46-61 cm) from it. Place your feet hip-width apart. Slowly slide down the wall or door until your knees bend __________ degrees. Keep your knees over your heels, not over your toes. Keep your knees in line with your hips. Hold this position for __________ seconds. Repeat __________ times. Complete this exercise __________ times a day. Straight leg raises, side-lying This exercise strengthens the muscles that rotate the leg at the hip and move it away from your body (hip abductors). Lie on your side with your left / right leg in the top position. Lie so your head, shoulder, knee, and hip line up. You may bend your bottom knee  to help you keep your balance. Roll your hips slightly forward so your hips are stacked directly over each other and your left / right knee is facing forward. Leading with your heel, lift your top leg 4-6 inches (10-15 cm). You should feel the muscles in your outer hip lifting. Do not let your foot drift forward. Do not let your knee roll toward the ceiling. Hold this position for __________ seconds. Slowly return your leg to the starting position. Let your muscles relax completely after each repetition. Repeat __________ times. Complete this exercise __________ times a day. Straight leg raises, prone This exercise stretches the muscles that move your hips away from the front of the pelvis (hip extensors). Lie on your abdomen on a firm surface. You can put a pillow under your hips if that is more comfortable.  Tense the muscles in your buttocks and lift your left / right leg about 4-6 inches (10-15 cm). Keep your knee straight as you lift your leg. Hold this position for __________ seconds. Slowly lower your leg to the starting position. Let your leg relax completely after each repetition. Repeat __________ times. Complete this exercise __________ times a day. This information is not intended to replace advice given to you by your health care provider. Make sure you discuss any questions you have with your health care provider. Document Revised: 03/03/2021 Document Reviewed: 03/03/2021 Elsevier Patient Education  [place].

## 2021-07-30 ENCOUNTER — Other Ambulatory Visit: Payer: Self-pay | Admitting: Orthopedic Surgery

## 2021-07-30 DIAGNOSIS — E875 Hyperkalemia: Secondary | ICD-10-CM

## 2021-07-30 NOTE — Progress Notes (Signed)
CBC WNL.  Glucose remains elevated-238.  Creatinine is elevated-1.18 and GFR is low-47.  Please advise the patient to avoid NSAID use.  Potassium is elevated. Please clarify if she is taking a potassium supplement? Please notify the patient and forward results to PCP.   She will likely need to have potassium rechecked this week.

## 2021-08-01 LAB — COMPLETE METABOLIC PANEL WITH GFR
AG Ratio: 1.8 (calc) (ref 1.0–2.5)
ALT: 10 U/L (ref 6–29)
AST: 10 U/L (ref 10–35)
Albumin: 4.5 g/dL (ref 3.6–5.1)
Alkaline phosphatase (APISO): 102 U/L (ref 37–153)
BUN/Creatinine Ratio: 20 (calc) (ref 6–22)
BUN: 24 mg/dL (ref 7–25)
CO2: 29 mmol/L (ref 20–32)
Calcium: 9.4 mg/dL (ref 8.6–10.4)
Chloride: 100 mmol/L (ref 98–110)
Creat: 1.18 mg/dL — ABNORMAL HIGH (ref 0.60–1.00)
Globulin: 2.5 g/dL (calc) (ref 1.9–3.7)
Glucose, Bld: 238 mg/dL — ABNORMAL HIGH (ref 65–99)
Potassium: 5.5 mmol/L — ABNORMAL HIGH (ref 3.5–5.3)
Sodium: 136 mmol/L (ref 135–146)
Total Bilirubin: 1 mg/dL (ref 0.2–1.2)
Total Protein: 7 g/dL (ref 6.1–8.1)
eGFR: 47 mL/min/{1.73_m2} — ABNORMAL LOW (ref >=60)

## 2021-08-01 LAB — CBC WITH DIFFERENTIAL/PLATELET
Absolute Monocytes: 557 cells/uL (ref 200–950)
Basophils Absolute: 77 cells/uL (ref 0–200)
Basophils Relative: 0.8 %
Eosinophils Absolute: 240 cells/uL (ref 15–500)
Eosinophils Relative: 2.5 %
HCT: 38.4 % (ref 35.0–45.0)
Hemoglobin: 12.7 g/dL (ref 11.7–15.5)
Lymphs Abs: 2227 cells/uL (ref 850–3900)
MCH: 31.5 pg (ref 27.0–33.0)
MCHC: 33.1 g/dL (ref 32.0–36.0)
MCV: 95.3 fL (ref 80.0–100.0)
MPV: 11.5 fL (ref 7.5–12.5)
Monocytes Relative: 5.8 %
Neutro Abs: 6499 cells/uL (ref 1500–7800)
Neutrophils Relative %: 67.7 %
Platelets: 332 10*3/uL (ref 140–400)
RBC: 4.03 10*6/uL (ref 3.80–5.10)
RDW: 12 % (ref 11.0–15.0)
Total Lymphocyte: 23.2 %
WBC: 9.6 10*3/uL (ref 3.8–10.8)

## 2021-08-01 LAB — QUANTIFERON-TB GOLD PLUS
Mitogen-NIL: >10 IU/mL
NIL: 0.03 IU/mL
QuantiFERON-TB Gold Plus: NEGATIVE
TB1-NIL: 0 IU/mL
TB2-NIL: <0 IU/mL

## 2021-08-03 ENCOUNTER — Telehealth: Payer: Self-pay | Admitting: Rheumatology

## 2021-08-03 NOTE — Telephone Encounter (Signed)
Patient advised we sent the results to her PCP. Patient advised her PCP should be able to review results in Epic as well. Patient expressed understanding.

## 2021-08-03 NOTE — Telephone Encounter (Signed)
Patient left a voicemail stating she had labs done in the office and they were supposed to be sent to her primary. Patient states she spoke with them and they do not have her labs. Patient requests they go to her primary. Patient also requests a call back.

## 2021-08-03 NOTE — Progress Notes (Signed)
TB gold negative

## 2021-08-05 ENCOUNTER — Other Ambulatory Visit: Payer: Medicare Other

## 2021-08-06 ENCOUNTER — Ambulatory Visit (INDEPENDENT_AMBULATORY_CARE_PROVIDER_SITE_OTHER): Payer: Medicare Other | Admitting: Orthopedic Surgery

## 2021-08-06 ENCOUNTER — Encounter: Payer: Self-pay | Admitting: Orthopedic Surgery

## 2021-08-06 ENCOUNTER — Other Ambulatory Visit: Payer: Self-pay

## 2021-08-06 VITALS — BP 124/72 | HR 83 | Temp 97.9°F | Ht 62.0 in | Wt 157.0 lb

## 2021-08-06 DIAGNOSIS — I700 Atherosclerosis of aorta: Secondary | ICD-10-CM

## 2021-08-06 DIAGNOSIS — E785 Hyperlipidemia, unspecified: Secondary | ICD-10-CM | POA: Diagnosis not present

## 2021-08-06 DIAGNOSIS — E875 Hyperkalemia: Secondary | ICD-10-CM

## 2021-08-06 DIAGNOSIS — Z794 Long term (current) use of insulin: Secondary | ICD-10-CM | POA: Diagnosis not present

## 2021-08-06 DIAGNOSIS — E118 Type 2 diabetes mellitus with unspecified complications: Secondary | ICD-10-CM

## 2021-08-06 DIAGNOSIS — E1169 Type 2 diabetes mellitus with other specified complication: Secondary | ICD-10-CM

## 2021-08-06 LAB — LIPID PANEL
Cholesterol: 112 mg/dL (ref <200)
HDL: 39 mg/dL — ABNORMAL LOW (ref >=50)
LDL Cholesterol (Calc): 50 mg/dL (calc)
Non-HDL Cholesterol (Calc): 73 mg/dL (calc) (ref <130)
Total CHOL/HDL Ratio: 2.9 (calc) (ref <5.0)
Triglycerides: 146 mg/dL (ref <150)

## 2021-08-06 LAB — BASIC METABOLIC PANEL
BUN/Creatinine Ratio: 17 (calc) (ref 6–22)
BUN: 21 mg/dL (ref 7–25)
CO2: 25 mmol/L (ref 20–32)
Calcium: 9.1 mg/dL (ref 8.6–10.4)
Chloride: 101 mmol/L (ref 98–110)
Creat: 1.24 mg/dL — ABNORMAL HIGH (ref 0.60–1.00)
Glucose, Bld: 235 mg/dL — ABNORMAL HIGH (ref 65–99)
Potassium: 4.5 mmol/L (ref 3.5–5.3)
Sodium: 136 mmol/L (ref 135–146)

## 2021-08-06 MED ORDER — FREESTYLE LIBRE 2 SENSOR MISC: 1.0000 "application " | 3 refills | Status: DC

## 2021-08-06 NOTE — Patient Instructions (Addendum)
Please take blood pressure every morning- 2 hours after taking blood pressure medication x 1 week  Schedule lab visit for next week to recheck potassium level  Stop taking Spironolactone

## 2021-08-06 NOTE — Progress Notes (Signed)
Careteam: Patient Care Team: [name], NP as PCP - General (Adult Health Nurse Practitioner) Werner Lean, MD as PCP - Cardiology (Cardiology)  Seen by: Windell Moulding, AGNP-C  PLACE OF SERVICE:  [facility] Directive information    Allergies  Allergen Reactions   Amaryl [Glimepiride]     Don't Remember   Amlodipine     LE edema on 7.5mg  daily. Tolerates 5mg  daily just fine   Codeine     SICK   Jardiance [Empagliflozin]     Urinary tract infection- SIDE EFFECT   Pneumovax 23 [Pneumococcal Vac Polyvalent]     Red / Swelling / Hot to touch SIDE EFFECTS   Wellbutrin [Bupropion]     Unknown   Latex Rash    Chief Complaint  Patient presents with   Acute Visit    Patient requested appointment to discuss potassium.      HPI: Patient is a 81 y.o. female seen today for acute visit due to elevated potassium level.   01/25 potassium level 5.5. She was advised to see PCP for further evaluation. She denies any changes to her medications. 12/2020 she was started on spironolactone due to elevated blood pressure and ankle edema. Blood pressure at goal, she has not had ankle edema for a few months. Reports increased ankle edema in summer months. Denies chest pain, sob or palpitations.   She took magnesium citrate tablets for muscle cramping. She reports foul odor to urine and dysuria. She stopped taking supplement and symptoms resolved.   She is fasting for labs today. Would like cholesterol checked.   Followed by endocrinology for T2DM. A1c 8.6 01/10, remains on insulin NPH and trulicity. No recent hypoglycemic events. Continues to check blood sugars a few times daily.   Review of Systems:  Review of Systems  Constitutional:  Negative for chills, fever, malaise/fatigue and weight loss.  HENT: Negative.    Eyes: Negative.   Respiratory:  Negative for cough, shortness of breath and wheezing.   Cardiovascular:  Negative for chest pain, palpitations and leg  swelling.  Gastrointestinal: Negative.   Genitourinary:  Negative for dysuria, frequency and hematuria.  Musculoskeletal:  Positive for joint pain. Negative for falls.  Skin: Negative.   Neurological:  Negative for dizziness, seizures and headaches.  Psychiatric/Behavioral:  Negative for depression. The patient is not nervous/anxious.    Past Medical History:  Diagnosis Date   Acute cystitis    UTI   Anxiety    Arthritis    Benign positional vertigo    Dermatitis    Diabetes mellitus without complication ([place])    Family history of adverse reaction to anesthesia    GERD (gastroesophageal reflux disease)    Goiter, nontoxic, multinodular    Gout    H/O: hysterectomy    History of tobacco use    Hx laparoscopic cholecystectomy    Hypercholesterolemia    Hypertension    Incomplete bladder emptying    Insomnia    Postmenopausal atrophic vaginitis    RA (rheumatoid arthritis) ([place])    Rectocele    Renal calculus    Sleep apnea    Thyroid condition    Type 2 diabetes mellitus ([place])    Urolithiasis    Vaginal enterocele    Past Surgical History:  Procedure Laterality Date   CARPAL TUNNEL RELEASE  2018   CATARACT EXTRACTION, BILATERAL     CHOLECYSTECTOMY     COLONOSCOPY     07/11/2009, 02/2015   GALLBLADDER  SURGERY     KNEE ARTHROSCOPY Right 05/20/2020   Procedure: RIGHT KNEE ARTHROSCOPY AND DEBRIDEMENT;  Surgeon: [name], MD;  Location: [facility];  Service: Orthopedics;  Laterality: Right;   PARTIAL HYSTERECTOMY     Overies left intact   SQUAMOUS CELL CARCINOMA EXCISION  05/2018   Social History:   reports that she quit smoking about 43 years ago. Her smoking use included cigarettes. She has a 2.00 pack-year smoking history. She has never used smokeless tobacco. She reports that she does not drink alcohol and does not use drugs.  Family History  Problem Relation Age of Onset   Diabetes Mother    Heart disease Father 7   Diabetes Son    Crohn's  disease Son     Medications: Patient's Medications  New Prescriptions   No medications on file  Previous Medications   ACCU-CHEK SOFTCLIX LANCETS LANCETS    Use to test blood sugar three times daily. Dx: E11.8   ASPIRIN EC 81 MG TABLET    Take 81 mg by mouth daily.   CHLORTHALIDONE (HYGROTON) 25 MG TABLET    Take 1 tablet (25 mg total) by mouth daily.   CONTINUOUS BLOOD GLUC SENSOR (FREESTYLE LIBRE 2 SENSOR) MISC    1 application by Does not apply route every 14 (fourteen) days.   CRANBERRY PO    Take by mouth daily.   DICLOFENAC SODIUM (VOLTAREN EX)    Apply 1 application topically 3 (three) times daily as needed.   DULAGLUTIDE (TRULICITY) 1.5 YB/6.3SL SOPN    Inject 1.5 mg into the skin once a week.   ESTRADIOL (ESTRACE) 0.1 MG/GM VAGINAL CREAM    Place 1 Applicatorful vaginally 3 (three) times a week.   ETANERCEPT (ENBREL SURECLICK) 50 MG/ML INJECTION    Inject 50 mg into the skin once a week.   FLUOCINONIDE (LIDEX) 0.05 % EXTERNAL SOLUTION    Apply 1 application topically daily as needed.   GLUCOSE BLOOD (ACCU-CHEK GUIDE) TEST STRIP    USE TO CHECK BLOOD SUGAR THREE TIMES A DAY   INSULIN NPH HUMAN (HUMULIN N) 100 UNIT/ML INJECTION    40 units each morning, and 25 units each evening, and syringes 2/day.   IRBESARTAN (AVAPRO) 300 MG TABLET    Take 1 tablet (300 mg total) by mouth daily.   LOPERAMIDE (IMODIUM A-D) 2 MG TABLET    Take 2 mg by mouth as needed for diarrhea or loose stools.   LORAZEPAM (ATIVAN) 0.5 MG TABLET    Take 1 tablet (0.5 mg total) by mouth at bedtime.   MELATONIN PO    Take 10 mg by mouth at bedtime as needed.   OMEPRAZOLE 20 MG TBDD    Take 20 mg by mouth daily.   ONDANSETRON (ZOFRAN) 4 MG TABLET    Take 4 mg by mouth 3 (three) times daily as needed for nausea or vomiting.   PROBIOTIC PRODUCT (PROBIOTIC PO)    Take by mouth daily.   SIMVASTATIN (ZOCOR) 20 MG TABLET    TAKE ONE TABLET BY MOUTH EVERY EVENING   SPIRONOLACTONE (ALDACTONE) 25 MG TABLET    Take 1 tablet  (25 mg total) by mouth daily.  Modified Medications   No medications on file  Discontinued Medications   No medications on file    Physical Exam:  Vitals:   08/06/21 0901  Pulse: 83  Temp: 97.9 F (36.6 C)  SpO2: 98%  Weight: 157 lb (71.2 kg)  Height: 5\' 2"  (  1.575 m)   Body mass index is 28.72 kg/m. Wt Readings from Last 3 Encounters:  08/06/21 157 lb (71.2 kg)  07/29/21 158 lb 12.8 oz (72 kg)  07/16/21 158 lb (71.7 kg)    Physical Exam Vitals reviewed.  Constitutional:      General: She is not in acute distress. Eyes:     General:        Right eye: No discharge.        Left eye: No discharge.  Cardiovascular:     Rate and Rhythm: Normal rate and regular rhythm.     Pulses: Normal pulses.     Heart sounds: Normal heart sounds. No murmur heard. Pulmonary:     Effort: Pulmonary effort is normal. No respiratory distress.     Breath sounds: Normal breath sounds. No wheezing.  Abdominal:     General: Bowel sounds are normal. There is no distension.     Palpations: Abdomen is soft.     Tenderness: There is no abdominal tenderness.  Musculoskeletal:     Cervical back: Neck supple.     Right lower leg: No edema.     Left lower leg: No edema.  Skin:    General: Skin is warm and dry.     Capillary Refill: Capillary refill takes less than 2 seconds.  Neurological:     General: No focal deficit present.     Mental Status: She is alert and oriented to person, place, and time.  Psychiatric:        Mood and Affect: Mood normal.        Behavior: Behavior normal.    Labs reviewed: Basic Metabolic Panel: Recent Labs    02/11/21 1418 04/09/21 1138 04/29/21 1049 07/29/21 1056  NA 133*  --  137 136  K 4.4  --  4.9 5.5*  CL 99  --  101 100  CO2 24  --  26 29  GLUCOSE 291*  --  195* 238*  BUN 28*  --  28* 24  CREATININE 1.17*  --  1.20* 1.18*  CALCIUM 9.4  --  9.5 9.4  MG 1.6  --   --   --   TSH  --  1.81  --   --    Liver Function Tests: Recent Labs     02/11/21 1418 04/29/21 1049 07/29/21 1056  AST 12 15 10   ALT 11 12 10   BILITOT 0.9 1.1 1.0  PROT 7.2 6.9 7.0   No results for input(s): LIPASE, AMYLASE in the last 8760 hours. No results for input(s): AMMONIA in the last 8760 hours. CBC: Recent Labs    01/09/21 1337 04/09/21 1138 07/29/21 1056  WBC 11.7* 9.5 9.6  NEUTROABS 7,757 6,356 6,499  HGB 12.8 11.8 12.7  HCT 37.6 36.2 38.4  MCV 93.1 97.8 95.3  PLT 347 335 332   Lipid Panel: No results for input(s): CHOL, HDL, LDLCALC, TRIG, CHOLHDL, LDLDIRECT in the last 8760 hours. TSH: Recent Labs    04/09/21 1138  TSH 1.81   A1C: Lab Results  Component Value Date   HGBA1C 8.6 (A) 07/14/2021     Assessment/Plan 1. Hyperkalemia - K 5.5 07/29/2021 - asymptomatic, exam unremarkable - suspect from spironolactone - d/c spironolactone - plan to check blood pressures daily x 1 week - advised to watch out for increased ankle edema - Basic Metabolic Panel; Future - Basic Metabolic Panel  2. Type 2 diabetes mellitus with complication, with long-term current use of insulin (HCC) -  followed by endocrinology - A1c 8.6 01/10 - Continuous Blood Gluc Sensor (FREESTYLE LIBRE 2 SENSOR) MISC; 1 application by Does not apply route every 14 (fourteen) days.  Dispense: 2 each; Refill: 3 - cont insulin NPH and Trulicity - cont asa, statin and ARB - diabetic eye exam- future  3. Aortic atherosclerosis (HCC) - cont asa and statin - Lipid panel  4. Hyperlipidemia associated with type 2 diabetes mellitus (HCC) - LDL 55 01/2020, goal < 70 - cont statin - Lipid panel  Total time: 31 minutes. Greater than 50% of total time spent doing patient education on hyperkalemia, blood pressure medications, ankle edema, and diabetes management.    Next appt: 01/14/2022  Windell Moulding, [facility] [phone]

## 2021-08-10 ENCOUNTER — Other Ambulatory Visit: Payer: Self-pay | Admitting: Orthopedic Surgery

## 2021-08-12 ENCOUNTER — Other Ambulatory Visit: Payer: Self-pay

## 2021-08-12 DIAGNOSIS — E875 Hyperkalemia: Secondary | ICD-10-CM

## 2021-08-13 ENCOUNTER — Other Ambulatory Visit: Payer: Medicare Other

## 2021-08-13 ENCOUNTER — Ambulatory Visit: Payer: Medicare Other | Admitting: Internal Medicine

## 2021-08-13 ENCOUNTER — Encounter: Payer: Self-pay | Admitting: Internal Medicine

## 2021-08-13 ENCOUNTER — Other Ambulatory Visit: Payer: Self-pay

## 2021-08-13 VITALS — BP 130/76 | HR 78 | Ht 62.0 in | Wt 157.2 lb

## 2021-08-13 DIAGNOSIS — N1831 Chronic kidney disease, stage 3a: Secondary | ICD-10-CM | POA: Diagnosis not present

## 2021-08-13 DIAGNOSIS — E042 Nontoxic multinodular goiter: Secondary | ICD-10-CM

## 2021-08-13 DIAGNOSIS — E119 Type 2 diabetes mellitus without complications: Secondary | ICD-10-CM | POA: Insufficient documentation

## 2021-08-13 DIAGNOSIS — Z794 Long term (current) use of insulin: Secondary | ICD-10-CM

## 2021-08-13 DIAGNOSIS — E1122 Type 2 diabetes mellitus with diabetic chronic kidney disease: Secondary | ICD-10-CM | POA: Diagnosis not present

## 2021-08-13 DIAGNOSIS — E875 Hyperkalemia: Secondary | ICD-10-CM | POA: Diagnosis not present

## 2021-08-13 DIAGNOSIS — E785 Hyperlipidemia, unspecified: Secondary | ICD-10-CM | POA: Diagnosis not present

## 2021-08-13 DIAGNOSIS — E1165 Type 2 diabetes mellitus with hyperglycemia: Secondary | ICD-10-CM

## 2021-08-13 DIAGNOSIS — E118 Type 2 diabetes mellitus with unspecified complications: Secondary | ICD-10-CM | POA: Diagnosis not present

## 2021-08-13 LAB — BASIC METABOLIC PANEL
BUN/Creatinine Ratio: 19 (calc) (ref 6–22)
BUN: 28 mg/dL — ABNORMAL HIGH (ref 7–25)
CO2: 29 mmol/L (ref 20–32)
Calcium: 9.2 mg/dL (ref 8.6–10.4)
Chloride: 100 mmol/L (ref 98–110)
Creat: 1.49 mg/dL — ABNORMAL HIGH (ref 0.60–1.00)
Glucose, Bld: 234 mg/dL — ABNORMAL HIGH (ref 65–139)
Potassium: 4.8 mmol/L (ref 3.5–5.3)
Sodium: 138 mmol/L (ref 135–146)

## 2021-08-13 MED ORDER — INSULIN PEN NEEDLE 31G X 5 MM MISC: 1.0000 | Freq: Every day | 3 refills | Status: DC

## 2021-08-13 MED ORDER — TRESIBA FLEXTOUCH 200 UNIT/ML ~~LOC~~ SOPN: 60.0000 [IU] | PEN_INJECTOR | Freq: Every day | SUBCUTANEOUS | 3 refills | Status: DC

## 2021-08-13 NOTE — Progress Notes (Signed)
Name: Annette Suarez  Age/ Sex: 81 y.o., female   MRN/ DOB: [id number], 06/25/1941     PCP: [name], NP   Reason for Endocrinology Evaluation: Type 2 Diabetes Mellitus  Initial Endocrine Consultative Visit: 05/11/2021    PATIENT IDENTIFIER: Annette Suarez is a 81 y.o. female with a past medical history of T2DM, HTN, RA and dyslipidemia. The patient has followed with Endocrinology clinic since 05/11/2021 for consultative assistance with management of her diabetes.  DIABETIC HISTORY:  Ms. Plaia was diagnosed with DM in ~ 1995 . She is intolerant to Metformin- nausea and Jardiance due to UTI's .Her hemoglobin A1c has ranged from 7.5% in 2021, peaking at 8.6% in 2023.  Transitioned care from Dr. Loanne Drilling 11/7844  Started trulicity in 96/2952  THYROID HISTORY : She has been diagnosed with MNG based on thyroid ultrasound in 2011 showing multiple thyroid nodules. She is s/p benign cytology of the left middle and left inferior nodules in 2011. Ultrasound in 2021 confirmed at least a 5 year stability    SUBJECTIVE:   During the last visit (07/14/2021): A1c 8.6% . Saw Dr. Loanne Drilling   Today (08/13/2021): Annette Suarez is here to establish care for diabetes. She has been checking blood sugars 2 times daily. The patient has not had hypoglycemic episodes since the last clinic visit.  Neck size stable  Avoids sugar sweetened beverages  Takes evening NPH right after supper    HOME DIABETES REGIMEN:  Trulicity 1.5 mg weekly  Humulin-N 40 units QAM and 25 units QPM ( Vials)     Statin: yes ACE-I/ARB: yes    GLUCOSE LOG: 121 -350     DIABETIC COMPLICATIONS: Microvascular complications:  CKD III Denies: retinopathy, neuropathy  Last Eye Exam: Completed 07/2020  Macrovascular complications:   Denies: CAD, CVA, PVD   HISTORY:  Past Medical History:  Past Medical History:  Diagnosis Date   Acute cystitis    UTI   Anxiety    Arthritis    Benign positional vertigo    Dermatitis     Diabetes mellitus without complication ([place])    Family history of adverse reaction to anesthesia    GERD (gastroesophageal reflux disease)    Goiter, nontoxic, multinodular    Gout    H/O: hysterectomy    History of tobacco use    Hx laparoscopic cholecystectomy    Hypercholesterolemia    Hypertension    Incomplete bladder emptying    Insomnia    Postmenopausal atrophic vaginitis    RA (rheumatoid arthritis) ([place])    Rectocele    Renal calculus    Sleep apnea    Thyroid condition    Type 2 diabetes mellitus ([place])    Urolithiasis    Vaginal enterocele    Past Surgical History:  Past Surgical History:  Procedure Laterality Date   CARPAL TUNNEL RELEASE  2018   CATARACT EXTRACTION, BILATERAL     CHOLECYSTECTOMY     COLONOSCOPY     07/11/2009, 02/2015   GALLBLADDER SURGERY     KNEE ARTHROSCOPY Right 05/20/2020   Procedure: RIGHT KNEE ARTHROSCOPY AND DEBRIDEMENT;  Surgeon: [name], MD;  Location: [facility];  Service: Orthopedics;  Laterality: Right;   PARTIAL HYSTERECTOMY     Overies left intact   SQUAMOUS CELL CARCINOMA EXCISION  05/2018   Social History:  reports that she quit smoking about 43 years ago. Her smoking use included cigarettes. She has a 2.00 pack-year smoking history. She has never  used smokeless tobacco. She reports that she does not drink alcohol and does not use drugs. Family History:  Family History  Problem Relation Age of Onset   Diabetes Mother    Heart disease Father 56   Diabetes Son    Crohn's disease Son      HOME MEDICATIONS: Allergies as of 08/13/2021       Reactions   Amaryl [glimepiride]    Don't Remember   Amlodipine    LE edema on 7.5mg  daily. Tolerates 5mg  daily just fine   Codeine    SICK   Jardiance [empagliflozin]    Urinary tract infection- SIDE EFFECT   Magnesium Citrate    Strong urine odor and dysuria   Pneumovax 23 [pneumococcal Vac Polyvalent]    Red / Swelling / Hot to touch SIDE EFFECTS    Wellbutrin [bupropion]    Unknown   Latex Rash        Medication List        Accurate as of August 13, 2021 10:57 AM. If you have any questions, ask your nurse or doctor.          Accu-Chek Guide test strip Generic drug: glucose blood USE TO CHECK BLOOD SUGAR THREE TIMES A DAY   Accu-Chek Softclix Lancets lancets Use to test blood sugar three times daily. Dx: E11.8   aspirin EC 81 MG tablet Take 81 mg by mouth daily.   chlorthalidone 25 MG tablet Commonly known as: HYGROTON Take 1 tablet (25 mg total) by mouth daily.   CRANBERRY PO Take by mouth daily.   Enbrel SureClick 50 MG/ML injection Generic drug: etanercept Inject 50 mg into the skin once a week.   estradiol 0.1 MG/GM vaginal cream Commonly known as: ESTRACE Place 1 Applicatorful vaginally 3 (three) times a week.   fluocinonide 0.05 % external solution Commonly known as: LIDEX Apply 1 application topically daily as needed.   FreeStyle Libre 2 Sensor Misc 1 application by Does not apply route every 14 (fourteen) days.   insulin NPH Human 100 UNIT/ML injection Commonly known as: HumuLIN N 40 units each morning, and 25 units each evening, and syringes 2/day.   irbesartan 300 MG tablet Commonly known as: AVAPRO Take 1 tablet (300 mg total) by mouth daily.   loperamide 2 MG tablet Commonly known as: IMODIUM A-D Take 2 mg by mouth as needed for diarrhea or loose stools.   LORazepam 0.5 MG tablet Commonly known as: ATIVAN Take 1 tablet (0.5 mg total) by mouth at bedtime.   MELATONIN PO Take 10 mg by mouth at bedtime as needed.   Omeprazole 20 MG Tbdd Take 20 mg by mouth daily.   ondansetron 4 MG tablet Commonly known as: ZOFRAN Take 4 mg by mouth 3 (three) times daily as needed for nausea or vomiting.   PROBIOTIC PO Take by mouth daily.   simvastatin 20 MG tablet Commonly known as: ZOCOR TAKE ONE TABLET BY MOUTH EVERY EVENING   Trulicity 1.5 UX/3.2GM Sopn Generic drug:  Dulaglutide Inject 1.5 mg into the skin once a week.   VOLTAREN EX Apply 1 application topically 3 (three) times daily as needed.         OBJECTIVE:   Vital Signs: BP 130/76 (BP Location: Left Arm, Patient Position: Sitting, Cuff Size: Small)    Pulse 78    Ht 5\' 2"  (1.575 m)    Wt 157 lb 3.2 oz (71.3 kg)    SpO2 99%    BMI 28.75 kg/m  Wt Readings from Last 3 Encounters:  08/13/21 157 lb 3.2 oz (71.3 kg)  08/06/21 157 lb (71.2 kg)  07/29/21 158 lb 12.8 oz (72 kg)     Exam: General: Pt appears well and is in NAD  Neck: General: Supple without adenopathy. Thyroid: Thyroid size normal.  No goiter or nodules appreciated.   Lungs: Clear with good BS bilat with no rales, rhonchi, or wheezes  Heart: RRR with normal S1 and S2 and no gallops; no murmurs; no rub  Abdomen: Normoactive bowel sounds, soft, nontender, without masses or organomegaly palpable  Extremities: No pretibial edema.  Neuro: MS is good with appropriate affect, pt is alert and Ox3      DATA REVIEWED:  Lab Results  Component Value Date   HGBA1C 8.6 (A) 07/14/2021   HGBA1C 8.1 (A) 05/11/2021   HGBA1C 8.1 (H) 04/09/2021   Lab Results  Component Value Date   MICROALBUR 1.2 06/05/2020   LDLCALC 50 08/06/2021   CREATININE 1.24 (H) 08/06/2021   Lab Results  Component Value Date   MICRALBCREAT 14 06/05/2020     Lab Results  Component Value Date   CHOL 112 08/06/2021   HDL 39 (L) 08/06/2021   LDLCALC 50 08/06/2021   TRIG 146 08/06/2021   CHOLHDL 2.9 08/06/2021        FNA of left medial 02/11/2010  INTERPRETATION(S):   THYROID, FINE NEEDLE ASPIRATION,LEFT #1:   Findings consistent with non-neoplastic goiter     FNA left inferior thyroid nodule 02/11/2010  INTERPRETATION(S):   THYROID, FINE NEEDLE ASPIRATION, LEFT, #2:   Findings consistent with non-neoplastic goiter.   ASSESSMENT / PLAN / RECOMMENDATIONS:   1) Type 2 Diabetes Mellitus, poorly controlled, With CKD III complications - Most  recent A1c of 8.6%. Goal A1c < 7.5 %.     -A1c goal <7.5 without hypoglycemia -We did discuss the difference between NPH and insulin analogs and the preference to switch to insulin analogs as well as insulin pens due to better safety profile and accuracy and dosing. -She has a few vials of the NPH that she would like to use, I am going to increase her morning dose of insulin and have advised her to take the evening dose and NPH at bedtime rather than after supper -We will continue current dose of Trulicity -Intolerant to metformin and Jardiance -She will be referred to our RD for CGM training(she has freestyle libre 2  MEDICATIONS: Continue Trulicity 1.5 mg weekly Change Humulin-and to 44 units every morning and 25 units at bedtime, once she switches to the new insulin will be [place] 60 units daily  EDUCATION / INSTRUCTIONS: BG monitoring instructions: Patient is instructed to check her blood sugars 2 times a day, fasting and suppertime. Call [facility] if: BG persistently < 70  I reviewed the Rule of 15 for the treatment of hypoglycemia in detail with the patient. Literature supplied.  REFERRALS: RD.   2) Diabetic complications:  Eye: Does not have known diabetic retinopathy.  Neuro/ Feet: Does not have known diabetic peripheral neuropathy .  Renal: Patient does  have known baseline CKD. She   is  on an ACEI/ARB at present.    3) Dyslipidemia :   - LDL at goal    Medication  Simvastatin 20 mg daily    4) Multinodular goiter:  -No local neck symptoms -History of benign FNA of left mid nodule and left inferior nodule in 2011 -Last ultrasound was in 2021 and 5-year stability has been  confirmed and no further ultrasounds have been recommended -Reassurance provided    F/U in 3 months     I spent 25 minutes preparing to see the patient by review of recent labs, imaging and procedures, obtaining and reviewing separately obtained history, communicating  with the patient/family or caregiver, ordering medications, tests or procedures, and documenting clinical information in the EHR including the differential Dx, treatment, and any further evaluation and other management      Signed electronically by: Mack Guise, MD  [facility]  [facility] [address] [place], [place] [address] Phone: [phone] FAX: [phone]   CC: [name], NP [address] [place] [address] Phone: [phone]  Fax: [phone]  Return to Endocrinology clinic as below: Future Appointments  Date Time Provider [place]  08/13/2021  1:30 PM [facility] None  12/30/2021 10:45 AM Bo Merino, MD [facility] None  01/14/2022 10:30 AM [name], NP [facility] None  05/11/2022  9:30 AM Lauree Chandler, NP [facility] None

## 2021-08-13 NOTE — Patient Instructions (Addendum)
-   Humulin -N 44 units every morning , 25 units at BEDTIME  - Continue Trulicity 1.5 mg weekly    NEW Insulin : This will replace current insulin  Tresiba 60 units ONCE Daily     HOW TO TREAT LOW BLOOD SUGARS (Blood sugar LESS THAN 70 MG/DL) Please follow the RULE OF 15 for the treatment of hypoglycemia treatment (when your (blood sugars are less than 70 mg/dL)   STEP 1: Take 15 grams of carbohydrates when your blood sugar is low, which includes:  3-4 GLUCOSE TABS  OR 3-4 OZ OF JUICE OR REGULAR SODA OR ONE TUBE OF GLUCOSE GEL    STEP 2: RECHECK blood sugar in 15 MINUTES STEP 3: If your blood sugar is still low at the 15 minute recheck --> then, go back to STEP 1 and treat AGAIN with another 15 grams of carbohydrates.

## 2021-08-27 ENCOUNTER — Other Ambulatory Visit: Payer: Self-pay | Admitting: Orthopedic Surgery

## 2021-08-27 DIAGNOSIS — F419 Anxiety disorder, unspecified: Secondary | ICD-10-CM

## 2021-08-27 DIAGNOSIS — G4709 Other insomnia: Secondary | ICD-10-CM

## 2021-08-27 NOTE — Telephone Encounter (Signed)
Rx last refilled on 04/09/2021 #30/3 refills   Treatment agreement on file from 11/27/2020

## 2021-09-01 ENCOUNTER — Telehealth: Payer: Self-pay

## 2021-09-01 NOTE — Telephone Encounter (Signed)
Patient advised per provider recommendations and verbalized understanding.

## 2021-09-01 NOTE — Telephone Encounter (Signed)
Patient states that she has been having chest pain that feels like heartburn and soreness. Patient wants to know if it could be from the Trulicity. Please advise

## 2021-09-02 NOTE — Telephone Encounter (Signed)
LMOM for patient to call and schedule Synvisc injections for her right knee.   ?

## 2021-09-02 NOTE — Telephone Encounter (Signed)
Please call to schedule visco knee injections ? ?Approved for Synvisc series right knee ?Buy and Bill ?No deductible ?Covered at 100% after $20 copay ?No PA required ? ? ?

## 2021-09-03 ENCOUNTER — Telehealth: Payer: Self-pay | Admitting: Internal Medicine

## 2021-09-03 NOTE — Telephone Encounter (Signed)
Werner Lean, MD  Donnalee Curry K ?Cc: Precious Gilding, RN ?Caller: Unspecified (Today, 10:09 AM) ?She has new chest pain with cardiovascular risk factors.  ?If red flags for CP ED eval is advised  ?Given new sx since last visit should be seen for potential ischemic eval  ?- DOD vs me/APP first available  ? ?Pt given ED precautions again and understands that she should not wait for the appt here if pain changes in any way or if any new symptom arises. Pt scheduled for first available DOD slot 09/07/21 @11 :30a ?

## 2021-09-03 NOTE — Telephone Encounter (Signed)
Pt c/o of Chest Pain: STAT if CP now or developed within 24 hours ? ?1. Are you having CP right now? no ? ?2. Are you experiencing any other symptoms (ex. SOB, nausea, vomiting, sweating)? Some nausea  ? ?3. How long have you been experiencing CP? A couple of hours  ? ?4. Is your CP continuous or coming and going? Comes and goes ? ?5. Have you taken Nitroglycerin? no ?? ? ?

## 2021-09-03 NOTE — Telephone Encounter (Signed)
Pt states that Monday evening (2/27) she was awakened from sleep with a severe pain in her chest that she describes as sharp, radiating across chest to bilateral shoulders. Pt confirms that she felt nauseous, but denies any vomiting, SOB, diaphoresis. Pt states she laid very still and "eventually after what seemed like hours, I fell back asleep." Pt denies that she is still experiencing CP, but states that for the last 2 nights when she lays down, she is having increased acid reflux. Pt confirms that she has not changed her diet recently and is still on Omeprazole 20mg . Cannot confirm that this is acid reflux, due to this. Questioned pt on any abnormal symptoms prior to Monday night and pt notes that she did feel bad earlier in the day and went to lunch with husband, but had to return home early due to feeling tired, experiencing a decreased appetite, and chills. Pt denies recent sickness otherwise, also denies at home viral testing. Pt states she doesn't regularly check BP, but recently did and "I can't remember the number, but it was good." Denies any chest palpitations or abnormal sensations. ED precautions given, will route to MD for recommendation. ?

## 2021-09-06 NOTE — Progress Notes (Signed)
Cardiology Office Note:    Date:  09/07/2021   ID:  Annette Suarez, DOB 1940/10/13, MRN [id number]  PCP:  [name], NP  Cardiologist:  Werner Lean, MD   Referring MD: [name], NP   Chief Complaint  Patient presents with   Chest Pain    History of Present Illness:    Annette Suarez is a 81 y.o. female with a hx of new chest pain. Prior h/o primary hypertension, hyperlipidemia, diabetes mellitus type 2, OSA not treated, GERD, aortic atherosclerosis, and left subclavian stenosis.  1 week ago she noticed a pressure-like sensation that started in the suprasternal notch and progressed down to the xiphoid area with radiation into the back.  She described waxing and waning discomfort stating that it would increase in intensity and then almost completely resolved.  She feels it went off and on nearly 2 hours before she fell asleep.  She did not have the presence of mind to consider evaluation by emergency medical services.  For the 2 days that followed each night she had "indigestion" when she would lie down.  She denies shortness of breath.  No exertional dyspnea or limitation since the episode a week ago.  She is not a current smoker but did smoke in her early life.  She is diabetic, has hypertension, known left subclavian obstructive disease,  Past Medical History:  Diagnosis Date   Acute cystitis    UTI   Anxiety    Arthritis    Benign positional vertigo    Dermatitis    Diabetes mellitus without complication ([place])    Family history of adverse reaction to anesthesia    GERD (gastroesophageal reflux disease)    Goiter, nontoxic, multinodular    Gout    H/O: hysterectomy    History of tobacco use    Hx laparoscopic cholecystectomy    Hypercholesterolemia    Hypertension    Incomplete bladder emptying    Insomnia    Postmenopausal atrophic vaginitis    RA (rheumatoid arthritis) ([place])    Rectocele    Renal calculus    Sleep apnea    Thyroid condition    Type 2  diabetes mellitus ([place])    Urolithiasis    Vaginal enterocele     Past Surgical History:  Procedure Laterality Date   CARPAL TUNNEL RELEASE  2018   CATARACT EXTRACTION, BILATERAL     CHOLECYSTECTOMY     COLONOSCOPY     07/11/2009, 02/2015   GALLBLADDER SURGERY     KNEE ARTHROSCOPY Right 05/20/2020   Procedure: RIGHT KNEE ARTHROSCOPY AND DEBRIDEMENT;  Surgeon: [name], MD;  Location: [facility];  Service: Orthopedics;  Laterality: Right;   PARTIAL HYSTERECTOMY     [place] left intact   SQUAMOUS CELL CARCINOMA EXCISION  05/2018    Current Medications: Current Meds  Medication Sig   Accu-Chek Softclix Lancets lancets Use to test blood sugar three times daily. Dx: E11.8   aspirin EC 81 MG tablet Take 81 mg by mouth daily.   chlorthalidone (HYGROTON) 25 MG tablet Take 1 tablet (25 mg total) by mouth daily.   Continuous Blood Gluc Sensor (FREESTYLE LIBRE 2 SENSOR) MISC 1 application by Does not apply route every 14 (fourteen) days.   CRANBERRY PO Take by mouth daily.   Diclofenac Sodium (VOLTAREN EX) Apply 1 application topically 3 (three) times daily as needed.   Dulaglutide (TRULICITY) 1.5 FU/9.3AT SOPN Inject 1.5 mg into the skin once a  week.   estradiol (ESTRACE) 0.1 MG/GM vaginal cream Place 1 Applicatorful vaginally 3 (three) times a week.   etanercept (ENBREL SURECLICK) 50 MG/ML injection Inject 50 mg into the skin once a week.   fluocinonide (LIDEX) 0.05 % external solution Apply 1 application topically daily as needed.   glucose blood (ACCU-CHEK GUIDE) test strip USE TO CHECK BLOOD SUGAR THREE TIMES A DAY   insulin degludec (TRESIBA FLEXTOUCH) 200 UNIT/ML FlexTouch Pen Inject 60 Units into the skin daily in the afternoon.   insulin NPH Human (HUMULIN N) 100 UNIT/ML injection 40 units each morning, and 25 units each evening, and syringes 2/day.   Insulin Pen Needle 31G X 5 MM MISC 1 Device by Does not apply route daily in the afternoon.   irbesartan (AVAPRO)  300 MG tablet Take 1 tablet (300 mg total) by mouth daily.   loperamide (IMODIUM A-D) 2 MG tablet Take 2 mg by mouth as needed for diarrhea or loose stools.   LORazepam (ATIVAN) 0.5 MG tablet TAKE ONE TABLET BY MOUTH EVERY NIGHT AT BEDTIME   MELATONIN PO Take 10 mg by mouth at bedtime as needed.   metoprolol succinate (TOPROL-XL) 100 MG 24 hr tablet TAKE ONE TABLET BY MOUTH 2 HOURS PRIOR TO CT.   nitroGLYCERIN (NITROSTAT) 0.4 MG SL tablet Place 1 tablet (0.4 mg total) under the tongue every 5 (five) minutes as needed for chest pain.   Omeprazole 20 MG TBDD Take 20 mg by mouth daily.   ondansetron (ZOFRAN) 4 MG tablet Take 4 mg by mouth 3 (three) times daily as needed for nausea or vomiting.   Probiotic Product (PROBIOTIC PO) Take by mouth daily.   simvastatin (ZOCOR) 20 MG tablet TAKE ONE TABLET BY MOUTH EVERY EVENING     Allergies:   Amaryl [glimepiride], Amlodipine, Codeine, Jardiance [empagliflozin], Magnesium citrate, Pneumovax 23 [pneumococcal vac polyvalent], Wellbutrin [bupropion], and Latex   Social History   Socioeconomic History   Marital status: Married    Spouse name: Not on file   Number of children: Not on file   Years of education: Not on file   Highest education level: Not on file  Occupational History   Not on file  Tobacco Use   Smoking status: Former    Packs/day: 0.20    Years: 10.00    Pack years: 2.00    Types: Cigarettes    Quit date: 07/05/1978    Years since quitting: 43.2   Smokeless tobacco: Never  Vaping Use   Vaping Use: Never used  Substance and Sexual Activity   Alcohol use: No   Drug use: No   Sexual activity: Not Currently  Other Topics Concern   Not on file  Social History Narrative   Diet: Blank      Do you drink/ eat things with caffeine? Yes      Marital status:  Married                             What year were you married ? 196      Do you live in a house, apartment,assistred living, condo, trailer, etc.)? House      Is it one or  more stories? 1      How many persons live in your home ? 2      Do you have any pets in your home ?(please list)  1 Sam-Dog      Highest Level of education completed:  12      Current or past profession: Office      Do you exercise?   Some                           Type & how often  Walk Daily      ADVANCED DIRECTIVES (Please bring copies)      Do you have a living will? Blank      Do you have a DNR form?  Blank                     If not, do you want to discuss one?       Do you have signed POA?HPOA forms?  Yes               If so, please bring to your appointment      FUNCTIONAL STATUS- To be completed by Spouse / child / Staff       Do you have difficulty bathing or dressing yourself ?  No      Do you have difficulty preparing food or eating ?  No      Do you have difficulty managing your mediation ?  No      Do you have difficulty managing your finances ?  No      Do you have difficulty affording your medication ?  Yes (Some)      Social Determinants of Health   Financial Resource Strain: Not on file  Food Insecurity: Not on file  Transportation Needs: Not on file  Physical Activity: Not on file  Stress: Not on file  Social Connections: Not on file     Family History: The patient's family history includes Crohn's disease in her son; Diabetes in her mother and son; Heart disease (age of onset: 25) in her father.  ROS:   Please see the history of present illness.    She is concerned about the blockage in her left shoulder.  She has no exertional intolerance.  She has GERD quite frequently.  It feels like food is coming up into her throat when she lays down.  All other systems reviewed and are negative.  EKGs/Labs/Other Studies Reviewed:    The following studies were reviewed today: Coronary CT angio of chest and aorta 07/08/2020: IMPRESSION: Vascular:   1. Severe stenosis of the proximal left subclavian artery secondary to calcified atherosclerotic  plaque. 2. Widely patent and normal appearing right brachiocephalic and right subclavian arteries. No evidence of aortic coarctation, as queried. 3.  Aortic Atherosclerosis (ICD10-I70.0).   Non-Vascular:   No acute thoracic abnormality.  2D Doppler echocardiogram performed 07/08/2020: IMPRESSIONS   1. Left ventricular ejection fraction, by estimation, is >75%. The left  ventricle has hyperdynamic function. The left ventricle has no regional  wall motion abnormalities. Left ventricular diastolic parameters are  consistent with Grade I diastolic  dysfunction (impaired relaxation). Elevated left atrial pressure.   2. Right ventricular systolic function is normal. The right ventricular  size is normal. There is mildly elevated pulmonary artery systolic  pressure.   3. The mitral valve is normal in structure. Mild mitral valve  regurgitation. No evidence of mitral stenosis.   4. The aortic valve is tricuspid. Aortic valve regurgitation is trivial.  Mild aortic valve sclerosis is present, with no evidence of aortic valve  stenosis.   5. The inferior vena cava is normal in size with greater than 50%  respiratory variability, suggesting right atrial pressure of 3 mmHg.   Carotid and subclavian Doppler study performed 07/16/2020: Summary:  Right Carotid: Velocities in the right ICA are consistent with a 1-39%  stenosis.   Left Carotid: Velocities in the left ICA are consistent with a 40-59%  stenosis.   Vertebrals:  Right vertebral artery demonstrates antegrade flow. Left  vertebral               artery demonstrates retrograde flow, consistent with  subclavian               steal syndrome.  Subclavians: Left subclavian artery flow was disturbed, with dampened  monophasic               flow consistent with a more proximal stenosis or occlusion.  Normal               flow hemodynamics were seen in the right subclavian artery.               There is a 20 mmHg difference brachial  pressure gradient,  with the               left arm being the lowest. The right brachial artery is  patent with               triphasic flow, the left brachial artery is patent with               bi/monophasic flow.   *See table(s) above for measurements and observations.  Suggest follow up study in 12 months.   EKG:  EKG normal sinus rhythm, J-point depression with diffuse ST depression.  Normal PR interval.  Otherwise normal EKG.  When compared to June 10, 2021, no significant changes noted.  Heart rate is slightly slower.  Recent Labs: 02/11/2021: Magnesium 1.6 04/09/2021: TSH 1.81 07/29/2021: ALT 10; Hemoglobin 12.7; Platelets 332 08/13/2021: BUN 28; Creat 1.49; Potassium 4.8; Sodium 138  Recent Lipid Panel    Component Value Date/Time   CHOL 112 08/06/2021 0929   TRIG 146 08/06/2021 0929   HDL 39 (L) 08/06/2021 0929   CHOLHDL 2.9 08/06/2021 0929   LDLCALC 50 08/06/2021 0929    Physical Exam:    VS:  BP (!) 144/74    Pulse 88    Ht '5\' 2"'$  (1.575 m)    Wt 157 lb 6.4 oz (71.4 kg)    SpO2 99%    BMI 28.79 kg/m     Wt Readings from Last 3 Encounters:  09/07/21 157 lb 6.4 oz (71.4 kg)  08/13/21 157 lb 3.2 oz (71.3 kg)  08/06/21 157 lb (71.2 kg)    Left arm blood pressure 110/90 mmHg.  Right arm blood pressure 148/86 mmHg GEN: Overweight. No acute distress HEENT: Normal NECK: No JVD. LYMPHATICS: No lymphadenopathy CARDIAC: Soft right upper sternal systolic murmur. RRR S4 but no S3 gallop, or edema. VASCULAR: Slight asymmetry and radial pulses with left being weaker than right.  Normal Pulses at other sites including the posterior tibial bilaterally.. No bruits. RESPIRATORY:  Clear to auscultation without rales, wheezing or rhonchi  ABDOMEN: Soft, non-tender, non-distended, No pulsatile mass, MUSCULOSKELETAL: No deformity  SKIN: Warm and dry NEUROLOGIC:  Alert and oriented x 3 PSYCHIATRIC:  Normal affect   ASSESSMENT:    1. Chest pain of uncertain etiology   2.  Hyperlipidemia, unspecified hyperlipidemia type   3. Stenosis of left subclavian artery (HCC)   4. Essential hypertension   5. Aortic atherosclerosis (  [place])   6. OSA (obstructive sleep apnea)   7. Type 2 diabetes mellitus with hyperglycemia, with long-term current use of insulin (HCC)   8. Stage 3b chronic kidney disease (CKD) ([place])   9. Precordial pain    PLAN:    In order of problems listed above:  Coronary CTA with morphology and FFR if indicated.  Sublingual nitroglycerin if recurrent chest pressure.  Notify us if recurrent chest pressure.  Continue aspirin. Continue statin therapy with rosuvastatin.  Most recently evaluated LDL was 50 August 06, 2021. Significant pressure differential between the left and right arm as noted above. Borderline elevated blood pressure in the right arm.  Depending upon CT results, may need to add beta-blocker therapy for better blood pressure control and anti-ischemic therapy. Aggressive preventive measures were discussed. Encouraged compliance with CPAP. Consider SGLT2 therapy for cardioprotection especially given CKD 3.    Overall education and awareness concerning primary risk prevention was discussed in detail: LDL less than 70, hemoglobin A1c less than 7, blood pressure target less than 130/80 mmHg, >150 minutes of moderate aerobic activity per week, avoidance of smoking, weight control (via diet and exercise), and continued surveillance/management of/for obstructive sleep apnea.  Prolonged office visit that required significant evaluation of chart data and personal review of prior cardiac imaging.  Most of the time was spent in counseling, which accounted for greater than 50% of the 55 minutes spent with the patient.  Medication Adjustments/Labs and Tests Ordered: Current medicines are reviewed at length with the patient today.  Concerns regarding medicines are outlined above.  Orders Placed This Encounter  Procedures   CT CORONARY MORPH W/CTA  COR W/SCORE W/CA W/CM &/OR WO/CM   Basic metabolic panel   EKG [id number]   Meds ordered this encounter  Medications   metoprolol succinate (TOPROL-XL) 100 MG 24 hr tablet    Sig: TAKE ONE TABLET BY MOUTH 2 HOURS PRIOR TO CT.    Dispense:  1 tablet    Refill:  0   nitroGLYCERIN (NITROSTAT) 0.4 MG SL tablet    Sig: Place 1 tablet (0.4 mg total) under the tongue every 5 (five) minutes as needed for chest pain.    Dispense:  25 tablet    Refill:  3    Patient Instructions  Medication Instructions:  START nitroglycerine sublingual tablets. Place one tablet under the tongue and and wait 5 minutes. Repeat up to three times and call EMS.  *If you need a refill on your cardiac medications before your next appointment, please call your pharmacy*   Lab Work: BMET today If you have labs (blood work) drawn today and your tests are completely normal, you will receive your results only by: [place] (if you have MyChart) OR A paper copy in the mail If you have any lab test that is abnormal or we need to change your treatment, we will call you to review the results.   Testing/Procedures: Non-Cardiac CT Angiography (CTA), is a special type of CT scan that uses a computer to produce multi-dimensional views of major blood vessels throughout the body. In CT angiography, a contrast material is injected through an IV to help visualize the blood vessels    Follow-Up: At [facility], you and your health needs are our priority.  As part of our continuing mission to provide you with exceptional heart care, we have created designated Provider Care Teams.  These Care Teams include your primary Cardiologist (physician) and Advanced Practice Providers (APPs -  Physician Assistants  and Nurse Practitioners) who all work together to provide you with the care you need, when you need it.  We recommend signing up for the patient portal called "MyChart".  Sign up information is provided on this After Visit  Summary.  MyChart is used to connect with patients for Virtual Visits (Telemedicine).  Patients are able to view lab/test results, encounter notes, upcoming appointments, etc.  Non-urgent messages can be sent to your provider as well.   To learn more about what you can do with MyChart, go to [email or web address].    Your next appointment:   1 month(s)  The format for your next appointment:   In Person  Provider:   Werner Lean, MD      Your cardiac CT will be scheduled at one of the below locations:   [facility] [address] [place], [place] [address] [phone]  Please arrive at the [facility] and Children's Entrance (Entrance C2) of [facility] 30 minutes prior to test start time. You can use the FREE valet parking offered at entrance C (encouraged to control the heart rate for the test)  Proceed to the [facility] Radiology Department (first floor) to check-in and test prep.  All radiology patients and guests should use entrance C2 at [facility], accessed from [facility], even though the hospital's physical address listed is [address]      Please follow these instructions carefully (unless otherwise directed):  On the Night Before the Test: Be sure to Drink plenty of water. Do not consume any caffeinated/decaffeinated beverages or chocolate 12 hours prior to your test. Do not take any antihistamines 12 hours prior to your test.  On the Day of the Test: Drink plenty of water until 1 hour prior to the test. Do not eat any food 4 hours prior to the test. You may take your regular medications prior to the test.  Take metoprolol (Lopressor) two hours prior to test. HOLD Furosemide/Hydrochlorothiazide morning of the test. FEMALES- please wear underwire-free bra if available, avoid dresses & tight clothing   *For Clinical Staff only. Please instruct patient the following:* Heart Rate Medication  Recommendations for Cardiac CT  Resting HR < 50 bpm  No medication  Resting HR 50-60 bpm and BP >110/50 mmHG   Consider Metoprolol tartrate 25 mg PO 90-120 min prior to scan  Resting HR 60-65 bpm and BP >110/50 mmHG  Metoprolol tartrate 50 mg PO 90-120 minutes prior to scan   Resting HR > 65 bpm and BP >110/50 mmHG  Metoprolol tartrate 100 mg PO 90-120 minutes prior to scan  Consider Ivabradine 10-15 mg PO or a calcium channel blocker for resting HR >60 bpm and contraindication to metoprolol tartrate  Consider Ivabradine 10-15 mg PO in combination with metoprolol tartrate for HR >80 bpm         After the Test: Drink plenty of water. After receiving IV contrast, you may experience a mild flushed feeling. This is normal. On occasion, you may experience a mild rash up to 24 hours after the test. This is not dangerous. If this occurs, you can take Benadryl 25 mg and increase your fluid intake. If you experience trouble breathing, this can be serious. If it is severe call 911 IMMEDIATELY. If it is mild, please call our office. If you take any of these medications: Glipizide/Metformin, Avandament, Glucavance, please do not take 48 hours after completing test unless otherwise instructed.  We will call to  schedule your test 2-4 weeks out understanding that some insurance companies will need an authorization prior to the service being performed.   For non-scheduling related questions, please contact the cardiac imaging nurse navigator should you have any questions/concerns: Marchia Bond, Cardiac Imaging Nurse Navigator Gordy Clement, Cardiac Imaging Nurse Navigator [facility] Direct Office Dial: [phone]   For scheduling needs, including cancellations and rescheduling, please call [place], [phone].       Signed, Sinclair Grooms, MD  09/07/2021 12:25 PM    [facility]

## 2021-09-07 ENCOUNTER — Other Ambulatory Visit: Payer: Self-pay

## 2021-09-07 ENCOUNTER — Encounter: Payer: Self-pay | Admitting: Interventional Cardiology

## 2021-09-07 ENCOUNTER — Ambulatory Visit: Payer: Medicare Other | Admitting: Interventional Cardiology

## 2021-09-07 VITALS — BP 144/74 | HR 88 | Ht 62.0 in | Wt 157.4 lb

## 2021-09-07 DIAGNOSIS — E1165 Type 2 diabetes mellitus with hyperglycemia: Secondary | ICD-10-CM

## 2021-09-07 DIAGNOSIS — R072 Precordial pain: Secondary | ICD-10-CM

## 2021-09-07 DIAGNOSIS — E785 Hyperlipidemia, unspecified: Secondary | ICD-10-CM

## 2021-09-07 DIAGNOSIS — R079 Chest pain, unspecified: Secondary | ICD-10-CM | POA: Diagnosis not present

## 2021-09-07 DIAGNOSIS — I10 Essential (primary) hypertension: Secondary | ICD-10-CM

## 2021-09-07 DIAGNOSIS — Z794 Long term (current) use of insulin: Secondary | ICD-10-CM

## 2021-09-07 DIAGNOSIS — N1832 Chronic kidney disease, stage 3b: Secondary | ICD-10-CM | POA: Diagnosis not present

## 2021-09-07 DIAGNOSIS — I771 Stricture of artery: Secondary | ICD-10-CM | POA: Diagnosis not present

## 2021-09-07 DIAGNOSIS — I700 Atherosclerosis of aorta: Secondary | ICD-10-CM | POA: Diagnosis not present

## 2021-09-07 DIAGNOSIS — G4733 Obstructive sleep apnea (adult) (pediatric): Secondary | ICD-10-CM

## 2021-09-07 MED ORDER — NITROGLYCERIN 0.4 MG SL SUBL: 0.4000 mg | SUBLINGUAL_TABLET | SUBLINGUAL | 3 refills | Status: DC | PRN

## 2021-09-07 MED ORDER — METOPROLOL SUCCINATE ER 100 MG PO TB24: ORAL_TABLET | ORAL | 0 refills | Status: DC

## 2021-09-07 NOTE — Patient Instructions (Addendum)
Medication Instructions:  ?START nitroglycerine sublingual tablets. Place one tablet under the tongue and and wait 5 minutes. Repeat up to three times and call EMS.  ?*If you need a refill on your cardiac medications before your next appointment, please call your pharmacy* ? ? ?Lab Work: ?BMET today ?If you have labs (blood work) drawn today and your tests are completely normal, you will receive your results only by: ?MyChart Message (if you have MyChart) OR ?A paper copy in the mail ?If you have any lab test that is abnormal or we need to change your treatment, we will call you to review the results. ? ? ?Testing/Procedures: ?Non-Cardiac CT Angiography (CTA), is a special type of CT scan that uses a computer to produce multi-dimensional views of major blood vessels throughout the body. In CT angiography, a contrast material is injected through an IV to help visualize the blood vessels ? ? ? ?Follow-Up: ?At [facility], you and your health needs are our priority.  As part of our continuing mission to provide you with exceptional heart care, we have created designated Provider Care Teams.  These Care Teams include your primary Cardiologist (physician) and Advanced Practice Providers (APPs -  Physician Assistants and Nurse Practitioners) who all work together to provide you with the care you need, when you need it. ? ?We recommend signing up for the patient portal called "MyChart".  Sign up information is provided on this After Visit Summary.  MyChart is used to connect with patients for Virtual Visits (Telemedicine).  Patients are able to view lab/test results, encounter notes, upcoming appointments, etc.  Non-urgent messages can be sent to your provider as well.   ?To learn more about what you can do with MyChart, go to [email or web address].   ? ?Your next appointment:   ?1 month(s) ? ?The format for your next appointment:   ?In Person ? ?Provider:   ?Werner Lean, MD   ? ? ? ?Your cardiac CT will be  scheduled at one of the below locations:  ? ?[facility] ?[address] ?[place], [place] [address] ?[phone] ? ?Please arrive at the [facility] En[facility]ance (Entrance C2) of [facility] 30 minutes prior to test start time. ?You can use the FREE valet parking offered at entrance C (encouraged to control the heart rate for the test)  ?Proceed to the [facility] Radiology Department (first floor) to check-in and test prep. ? ?All radiology patients and guests should use entrance C2 at [facility], accessed from [facility], even though the hospital's physical address listed is [address]. ? ? ? ? ? ?Please follow these instructions carefully (unless otherwise directed): ? ?On the Night Before the Test: ?Be sure to Drink plenty of water. ?Do not consume any caffeinated/decaffeinated beverages or chocolate 12 hours prior to your test. ?Do not take any antihistamines 12 hours prior to your test. ? ?On the Day of the Test: ?Drink plenty of water until 1 hour prior to the test. ?Do not eat any food 4 hours prior to the test. ?You may take your regular medications prior to the test.  ?Take metoprolol (Lopressor) two hours prior to test. ?HOLD Furosemide/Hydrochlorothiazide morning of the test. ?FEMALES- please wear underwire-free bra if available, avoid dresses & tight clothing ? ? ?*For Clinical Staff only. Please instruct patient the following:* ?Heart Rate Medication Recommendations for Cardiac CT  ?Resting HR < 50 bpm  ?No medication  ?Resting HR 50-60 bpm and  BP >110/50 mmHG   ?Consider Metoprolol tartrate 25 mg PO 90-120 min prior to scan  ?Resting HR 60-65 bpm and BP >110/50 mmHG  ?Metoprolol tartrate 50 mg PO 90-120 minutes prior to scan   ?Resting HR > 65 bpm and BP >110/50 mmHG  ?Metoprolol tartrate 100 mg PO 90-120 minutes prior to scan  ?Consider Ivabradine 10-15 mg PO or a calcium channel blocker for resting HR >60 bpm and contraindication to  metoprolol tartrate  ?Consider Ivabradine 10-15 mg PO in combination with metoprolol tartrate for HR >80 bpm   ? ?     ?After the Test: ?Drink plenty of water. ?After receiving IV contrast, you may experience a mild flushed feeling. This is normal. ?On occasion, you may experience a mild rash up to 24 hours after the test. This is not dangerous. If this occurs, you can take Benadryl 25 mg and increase your fluid intake. ?If you experience trouble breathing, this can be serious. If it is severe call 911 IMMEDIATELY. If it is mild, please call our office. ?If you take any of these medications: Glipizide/Metformin, Avandament, Glucavance, please do not take 48 hours after completing test unless otherwise instructed. ? ?We will call to schedule your test 2-4 weeks out understanding that some insurance companies will need an authorization prior to the service being performed.  ? ?For non-scheduling related questions, please contact the cardiac imaging nurse navigator should you have any questions/concerns: ?Marchia Bond, Cardiac Imaging Nurse Navigator ?Gordy Clement, Cardiac Imaging Nurse Navigator ?[facility] ?Direct Office Dial: [phone]  ? ?For scheduling needs, including cancellations and rescheduling, please call [place], [phone]. ? ? ?  ?

## 2021-09-08 LAB — BASIC METABOLIC PANEL
BUN/Creatinine Ratio: 17 (ref 12–28)
BUN: 20 mg/dL (ref 8–27)
CO2: 27 mmol/L (ref 20–29)
Calcium: 9.3 mg/dL (ref 8.7–10.3)
Chloride: 100 mmol/L (ref 96–106)
Creatinine, Ser: 1.15 mg/dL — ABNORMAL HIGH (ref 0.57–1.00)
Glucose: 147 mg/dL — ABNORMAL HIGH (ref 70–99)
Potassium: 4 mmol/L (ref 3.5–5.2)
Sodium: 141 mmol/L (ref 134–144)
eGFR: 49 mL/min/{1.73_m2} — ABNORMAL LOW (ref >59)

## 2021-09-18 ENCOUNTER — Telehealth (HOSPITAL_COMMUNITY): Payer: Self-pay | Admitting: Emergency Medicine

## 2021-09-18 NOTE — Telephone Encounter (Signed)
Reaching out to patient to offer assistance regarding upcoming cardiac imaging study; pt verbalizes understanding of appt date/time, parking situation and where to check in, pre-test NPO status and medications ordered, and verified current allergies; name and call back number provided for further questions should they arise ?Marchia Bond RN Navigator Cardiac Imaging ?[facility] ?[phone] office ?[phone] cell ? ?[place]- taking home PRN lorazepam (has driver) ?Denies iv issues ?'100mg'$  metoprolol tartrate ?Arrival 800 ? ?

## 2021-09-21 ENCOUNTER — Ambulatory Visit (HOSPITAL_COMMUNITY)
Admission: RE | Admit: 2021-09-21 | Discharge: 2021-09-21 | Disposition: A | Discharge status: Home / Self Care | Payer: Medicare Other | Source: Ambulatory Visit | Attending: Cardiology | Admitting: Cardiology

## 2021-09-21 ENCOUNTER — Other Ambulatory Visit: Payer: Self-pay | Admitting: Cardiology

## 2021-09-21 ENCOUNTER — Ambulatory Visit (HOSPITAL_COMMUNITY)
Admission: RE | Admit: 2021-09-21 | Discharge: 2021-09-21 | Disposition: A | Discharge status: Home / Self Care | Payer: Medicare Other | Source: Ambulatory Visit | Attending: Interventional Cardiology | Admitting: Interventional Cardiology

## 2021-09-21 ENCOUNTER — Other Ambulatory Visit: Payer: Self-pay

## 2021-09-21 DIAGNOSIS — R931 Abnormal findings on diagnostic imaging of heart and coronary circulation: Secondary | ICD-10-CM | POA: Diagnosis not present

## 2021-09-21 DIAGNOSIS — G4733 Obstructive sleep apnea (adult) (pediatric): Secondary | ICD-10-CM | POA: Insufficient documentation

## 2021-09-21 DIAGNOSIS — E785 Hyperlipidemia, unspecified: Secondary | ICD-10-CM | POA: Diagnosis not present

## 2021-09-21 DIAGNOSIS — I700 Atherosclerosis of aorta: Secondary | ICD-10-CM | POA: Insufficient documentation

## 2021-09-21 DIAGNOSIS — R072 Precordial pain: Secondary | ICD-10-CM | POA: Insufficient documentation

## 2021-09-21 DIAGNOSIS — I10 Essential (primary) hypertension: Secondary | ICD-10-CM | POA: Diagnosis not present

## 2021-09-21 DIAGNOSIS — R079 Chest pain, unspecified: Secondary | ICD-10-CM | POA: Diagnosis not present

## 2021-09-21 DIAGNOSIS — I771 Stricture of artery: Secondary | ICD-10-CM | POA: Insufficient documentation

## 2021-09-21 DIAGNOSIS — I2510 Atherosclerotic heart disease of native coronary artery without angina pectoris: Secondary | ICD-10-CM | POA: Diagnosis not present

## 2021-09-21 MED ORDER — NITROGLYCERIN 0.4 MG SL SUBL
SUBLINGUAL_TABLET | SUBLINGUAL | Status: AC
  Filled 2021-09-21: qty 2

## 2021-09-21 MED ORDER — NITROGLYCERIN 0.4 MG SL SUBL
0.8000 mg | SUBLINGUAL_TABLET | Freq: Once | SUBLINGUAL | Status: AC
  Administered 2021-09-21: 0.8 mg via SUBLINGUAL

## 2021-09-21 MED ORDER — METOPROLOL TARTRATE 5 MG/5ML IV SOLN
INTRAVENOUS | Status: AC
  Administered 2021-09-21: 10 mg via INTRAVENOUS
  Filled 2021-09-21: qty 20

## 2021-09-21 MED ORDER — IOHEXOL 350 MG/ML SOLN
100.0000 mL | Freq: Once | INTRAVENOUS | Status: AC | PRN
  Administered 2021-09-21: 100 mL via INTRAVENOUS

## 2021-09-21 MED ORDER — METOPROLOL TARTRATE 5 MG/5ML IV SOLN
10.0000 mg | INTRAVENOUS | Status: DC | PRN
  Administered 2021-09-21: 10 mg via INTRAVENOUS

## 2021-09-24 NOTE — Progress Notes (Signed)
?Cardiology Office Note:   ? ?Date:  09/24/2021  ? ?ID:  Annette Suarez, DOB [birth year], MRN [id number] ? ?PCP:  Annette Alanis, NP  ?[facility] Cardiologist:  Annette Haskell MD ?[facility] Electrophysiologist:  None  ? ?CC: Follow up moderate CAD ? ?History of Present Illness:   ? ?Annette Suarez is a 81 y.o. female with a hx of Diabetes and HTN, HLD, OSA not on CPAP who presented for evaluation 06/11/20.   ?2022:  Had subclavian stenosis and aortic atherosclerosis, see by Dr. Fletcher Suarez for PAD.  Stopped norvasx for leg swelling. ?2023: DOD for CP, CCTA FFR negative. ? ?Patient notes that she is doing better- no further chest pain since prior to DOD visit.   ?Notes persistent fatigue.  Unclear what her at home BP is (Right arm, has hx of sublclavian stenosis on L with no claudication or stroke like sx). ?There are no interval hospital/ED visit.   ? ?No chest pain or pressure .  No SOB/DOE and no PND/Orthopnea.  No weight gain or leg swelling.  No palpitations or syncope . ? ? ? ? ? ?Past Medical History:  ?Diagnosis Date  ? Acute cystitis   ? UTI  ? Anxiety   ? Arthritis   ? Benign positional vertigo   ? Dermatitis   ? Diabetes mellitus without complication ([place])   ? Family history of adverse reaction to anesthesia   ? GERD (gastroesophageal reflux disease)   ? Goiter, nontoxic, multinodular   ? Gout   ? H/O: hysterectomy   ? History of tobacco use   ? Hx laparoscopic cholecystectomy   ? Hypercholesterolemia   ? Hypertension   ? Incomplete bladder emptying   ? Insomnia   ? Postmenopausal atrophic vaginitis   ? RA (rheumatoid arthritis) (Annette Suarez)   ? Rectocele   ? Renal calculus   ? Sleep apnea   ? Thyroid condition   ? Type 2 diabetes mellitus ([place])   ? Urolithiasis   ? Vaginal enterocele   ? ? ?Past Surgical History:  ?Procedure Laterality Date  ? CARPAL TUNNEL RELEASE  2018  ? CATARACT EXTRACTION, BILATERAL    ? CHOLECYSTECTOMY    ? COLONOSCOPY    ? 07/11/2009, 02/2015  ? GALLBLADDER SURGERY    ? KNEE ARTHROSCOPY Right  05/20/2020  ? Procedure: RIGHT KNEE ARTHROSCOPY AND DEBRIDEMENT;  Surgeon: [name], MD;  Location: [facility];  Service: Orthopedics;  Laterality: Right;  ? PARTIAL HYSTERECTOMY    ? Overies left intact  ? SQUAMOUS CELL CARCINOMA EXCISION  05/2018  ? ? ?Current Medications: ?No outpatient medications have been marked as taking for the 09/25/21 encounter (Appointment) with Annette Lean, MD.  ?  ? ?Allergies:   Amaryl [glimepiride], Amlodipine, Codeine, Jardiance [empagliflozin], Magnesium citrate, Pneumovax 23 [pneumococcal vac polyvalent], Wellbutrin [bupropion], and Latex  ? ?Social History  ? ?Socioeconomic History  ? Marital status: Married  ?  Spouse name: Not on file  ? Number of children: Not on file  ? Years of education: Not on file  ? Highest education level: Not on file  ?Occupational History  ? Not on file  ?Tobacco Use  ? Smoking status: Former  ?  Packs/day: 0.20  ?  Years: 10.00  ?  Pack years: 2.00  ?  Types: Cigarettes  ?  Quit date: 07/05/1978  ?  Years since quitting: 43.2  ? Smokeless tobacco: Never  ?Vaping Use  ? Vaping Use: Never used  ?Substance  and Sexual Activity  ? Alcohol use: No  ? Drug use: No  ? Sexual activity: Not Currently  ?Other Topics Concern  ? Not on file  ?Social History Narrative  ? Diet: Blank  ?   ? Do you drink/ eat things with caffeine? Yes  ?   ? Marital status:  Married                             What year were you married ? 196  ?   ? Do you live in a house, apartment,assistred living, condo, trailer, etc.)? House  ?   ? Is it one or more stories? 1  ?   ? How many persons live in your home ? 2  ?   ? Do you have any pets in your home ?(please list)  1 Sam-Dog  ?   ? Highest Level of education completed: 12  ?   ? Current or past profession: Office  ?   ? Do you exercise?   Some                           Type & how often  Walk Daily  ?   ? ADVANCED DIRECTIVES (Please bring copies)  ?   ? Do you have a living will? Blank  ?   ? Do you have  a DNR form?  Blank                     If not, do you want to discuss one?   ?   ? Do you have signed POA?HPOA forms?  Yes               If so, please bring to your appointment  ?   ? FUNCTIONAL STATUS- To be completed by Spouse / child / Staff   ?   ? Do you have difficulty bathing or dressing yourself ?  No  ?   ? Do you have difficulty preparing food or eating ?  No  ?   ? Do you have difficulty managing your mediation ?  No  ?   ? Do you have difficulty managing your finances ?  No  ?   ? Do you have difficulty affording your medication ?  Yes (Some)  ?   ? ?Social Determinants of Health  ? ?Financial Resource Strain: Not on file  ?Food Insecurity: Not on file  ?Transportation Needs: Not on file  ?Physical Activity: Not on file  ?Stress: Not on file  ?Social Connections: Not on file  ?  ?Family History: ?The patient's family history includes Crohn's disease in her son; Diabetes in her mother and son; Heart disease (age of onset: 35) in her father. ?Denies family history of sudden cardiac death including drowning, car accidents, or unexplained deaths in the family. ?No history of aortic dissection, subclavian stenosis, or coarctation of the aorta in the family ? ?ROS:   ?Please see the history of present illness.    ?Right knee pain and recovering from knee surgery ?All other systems reviewed and are negative. ? ?EKGs/Labs/Other Studies Reviewed:   ? ?The following studies were reviewed today: ? ?EKG:   ?06/10/21 SR rate 92  ?05/16/20 SR rate 84 with non-specific T wave flattening ? ?Transthoracic Echocardiogram ?Date: 07/08/20 ?Results ? 1. Left ventricular ejection fraction, by estimation, is >75%. The left  ?  ventricle has hyperdynamic function. The left ventricle has no regional  ?wall motion abnormalities. Left ventricular diastolic parameters are  ?consistent with Grade I diastolic  ?dysfunction (impaired relaxation). Elevated left atrial pressure.  ? 2. Right ventricular systolic function is normal. The right  ventricular  ?size is normal. There is mildly elevated pulmonary artery systolic  ?pressure.  ? 3. The mitral valve is normal in structure. Mild mitral valve  ?regurgitation. No evidence of mitral stenosis.  ? 4. The aortic valve is tricuspid. Aortic valve regurgitation is trivial.  ?Mild aortic valve sclerosis is present, with no evidence of aortic valve  ?stenosis.  ? 5. The inferior vena cava is normal in size with greater than 50%  ?respiratory variability, suggesting right atrial pressure of 3 mmHg. ? ?Carotid Artery Duplex: ?Date: 07/16/20 ?Results: ?Summary:  ?Right Carotid: Velocities in the right ICA are consistent with a 1-39%  ?stenosis.  ? ?Left Carotid: Velocities in the left ICA are consistent with a 40-59%  ?stenosis.  ? ?Vertebrals:  Right vertebral artery demonstrates antegrade flow. Left  ?vertebral  ?             artery demonstrates retrograde flow, consistent with  ?subclavian  ?             steal syndrome.  ?Subclavians: Left subclavian artery flow was disturbed, with dampened  ?monophasic  ?             flow consistent with a more proximal stenosis or occlusion.  ?Normal  ?             flow hemodynamics were seen in the right subclavian artery.  ?             There is a 20 mmHg difference brachial pressure gradient,  ?with the  ?             left arm being the lowest. The right brachial artery is  ?patent with  ?             triphasic flow, the left brachial artery is patent with  ?             bi/monophasic flow.  ? ?NonCardiac CT: ?Date: 07/08/20 ?Results: ?IMPRESSION: ?Vascular: ? 1. Severe stenosis of the proximal left subclavian artery secondary ?to calcified atherosclerotic plaque. ?2. Widely patent and normal appearing right brachiocephalic and ?right subclavian arteries. No evidence of aortic coarctation, as ?queried. ?3.  Aortic Atherosclerosis ? ? ?Recent Labs: ?02/11/2021: Magnesium 1.6 ?04/09/2021: TSH 1.81 ?07/29/2021: ALT 10; Hemoglobin 12.7; Platelets 332 ?09/07/2021: BUN 20; Creatinine,  Ser 1.15; Potassium 4.0; Sodium 141  ?Recent Lipid Panel ?   ?Component Value Date/Time  ? CHOL 112 08/06/2021 0929  ? TRIG 146 08/06/2021 0929  ? HDL 39 (L) 08/06/2021 0929  ? CHOLHDL 2.9 02/02/202

## 2021-09-25 ENCOUNTER — Other Ambulatory Visit: Payer: Self-pay

## 2021-09-25 ENCOUNTER — Ambulatory Visit: Payer: Medicare Other | Admitting: Internal Medicine

## 2021-09-25 ENCOUNTER — Encounter: Payer: Self-pay | Admitting: Internal Medicine

## 2021-09-25 VITALS — BP 168/74 | HR 93 | Ht 62.0 in | Wt 157.4 lb

## 2021-09-25 DIAGNOSIS — E1165 Type 2 diabetes mellitus with hyperglycemia: Secondary | ICD-10-CM

## 2021-09-25 DIAGNOSIS — E1159 Type 2 diabetes mellitus with other circulatory complications: Secondary | ICD-10-CM

## 2021-09-25 DIAGNOSIS — I700 Atherosclerosis of aorta: Secondary | ICD-10-CM | POA: Diagnosis not present

## 2021-09-25 DIAGNOSIS — Z794 Long term (current) use of insulin: Secondary | ICD-10-CM

## 2021-09-25 DIAGNOSIS — I152 Hypertension secondary to endocrine disorders: Secondary | ICD-10-CM

## 2021-09-25 MED ORDER — CARVEDILOL 6.25 MG PO TABS
6.2500 mg | ORAL_TABLET | Freq: Two times a day (BID) | ORAL | 3 refills | Status: DC
Start: 2021-09-25 — End: 2022-10-26

## 2021-09-25 NOTE — Patient Instructions (Signed)
Medication Instructions:  ?Your physician has recommended you make the following change in your medication:  ?START: carvedilol (Coreg) 6.25 mg by mouth twice daily ? ?*If you need a refill on your cardiac medications before your next appointment, please call your pharmacy* ? ? ?Lab Work: ?NONE  ?If you have labs (blood work) drawn today and your tests are completely normal, you will receive your results only by: ?MyChart Message (if you have MyChart) OR ?A paper copy in the mail ?If you have any lab test that is abnormal or we need to change your treatment, we will call you to review the results. ? ? ?Testing/Procedures: ?NONE ? ? ?Follow-Up: ?At [facility], you and your health needs are our priority.  As part of our continuing mission to provide you with exceptional heart care, we have created designated Provider Care Teams.  These Care Teams include your primary Cardiologist (physician) and Advanced Practice Providers (APPs -  Physician Assistants and Nurse Practitioners) who all work together to provide you with the care you need, when you need it. ? ?We recommend signing up for the patient portal called "MyChart".  Sign up information is provided on this After Visit Summary.  MyChart is used to connect with patients for Virtual Visits (Telemedicine).  Patients are able to view lab/test results, encounter notes, upcoming appointments, etc.  Non-urgent messages can be sent to your provider as well.   ?To learn more about what you can do with MyChart, go to [email or web address].   ? ?Your next appointment:   ?4 month(s) ? ?The format for your next appointment:   ?In Person ? ?Provider:   ?Werner Lean, MD   ? ? ?Other Instructions ?Check BP in Right Arm keep a record of readings, call in 2 weeks ?

## 2021-10-05 ENCOUNTER — Encounter: Payer: Medicare Other | Attending: Internal Medicine | Admitting: Dietician

## 2021-10-05 ENCOUNTER — Encounter: Payer: Self-pay | Admitting: Dietician

## 2021-10-05 DIAGNOSIS — Z794 Long term (current) use of insulin: Secondary | ICD-10-CM | POA: Diagnosis not present

## 2021-10-05 DIAGNOSIS — Z6829 Body mass index (BMI) 29.0-29.9, adult: Secondary | ICD-10-CM | POA: Insufficient documentation

## 2021-10-05 DIAGNOSIS — Z713 Dietary counseling and surveillance: Secondary | ICD-10-CM | POA: Diagnosis not present

## 2021-10-05 DIAGNOSIS — E1122 Type 2 diabetes mellitus with diabetic chronic kidney disease: Secondary | ICD-10-CM

## 2021-10-05 DIAGNOSIS — E118 Type 2 diabetes mellitus with unspecified complications: Secondary | ICD-10-CM | POA: Diagnosis not present

## 2021-10-05 NOTE — Progress Notes (Signed)
? ?Diabetes Self-Management Education ? ?Visit Type: First/Initial ? ?Appt. Start Time: 1000 Appt. End Time: 1100 ? ?10/05/2021 ? ?[name], identified by name and date of birth, is a 81 y.o. female with a diagnosis of Diabetes: Type 2.  ? ?This visit was completed via telephone.   ?I spoke with patient and verified that I was speaking with the correct person with two patient identifiers (full name and date of birth).   ?I discussed the limitations related to this kind of visit and the patient is willing to proceed. ?She is in her home and I am at my office. ? ?She would like to learn more about blood glucose control. ?She is taking the [place] and noticed that her blood sugar goes up to about 300 after dinner.  She states her fasting reading is 69-100 first thing in the morning. ? ?History includes:  Type 2 Diabetes, GERD, HTN, HLD, CKD, OSA (no c-pap) ?CGM:  Libre 2 ?Medications include: Trulicity and Tresiba 60 units each afternoon ?Labs noted to include:  A1C 8.6% 07/14/2021 increased from 8.1% 05/11/2021, Cholesterol 112, HDL 39, LDL 50, Triglycerides 149 (08/06/2021), BUN 20, Creatinien 1.15, eGFR 45 (09/07/2021) ? ?Patient lives with her husband.  She does the shopping and cooking.   ?She is retired from the Forensic psychologist at [employer]. ?ASSESSMENT ? ?Height 5' 2"  (1.575 m), weight 160 lb (72.6 kg). ?Body mass index is 29.26 kg/m?. ? ? Diabetes Self-Management Education - 10/05/21 1031   ? ?  ? Visit Information  ? Visit Type First/Initial   ?  ? Initial Visit  ? Diabetes Type Type 2   ? Are you currently following a meal plan? No   ? Are you taking your medications as prescribed? Yes   ? Date Diagnosed 2000   ?  ? Health Coping  ? How would you rate your overall health? Good   ?  ? Psychosocial Assessment  ? Patient Belief/Attitude about Diabetes Motivated to manage diabetes   ? Self-care barriers None   ? Self-management support Doctor's office;Family   ? Other persons present Patient   ? Patient  Concerns Nutrition/Meal planning   ? Special Needs None   ? Preferred Learning Style No preference indicated   ? Learning Readiness Ready   ? How often do you need to have someone help you when you read instructions, pamphlets, or other written materials from your doctor or pharmacy? 1 - Never   ? What is the last grade level you completed in school? 12   ?  ? Pre-Education Assessment  ? Patient understands the diabetes disease and treatment process. Needs Instruction   ? Patient understands incorporating nutritional management into lifestyle. Needs Instruction   ? Patient undertands incorporating physical activity into lifestyle. Needs Instruction   ? Patient understands using medications safely. Needs Instruction   ? Patient understands monitoring blood glucose, interpreting and using results Needs Instruction   ? Patient understands prevention, detection, and treatment of acute complications. Needs Instruction   ? Patient understands prevention, detection, and treatment of chronic complications. Needs Instruction   ? Patient understands how to develop strategies to address psychosocial issues. Needs Instruction   ? Patient understands how to develop strategies to promote health/change behavior. Needs Instruction   ?  ? Complications  ? Last HgB A1C per patient/outside source 8.6 %   07/14/2021 increased from 8.1% 05/11/2021  ? How often do you check your blood sugar? > 4 times/day   ? Fasting  Blood glucose range (mg/dL) 70-129   ? Postprandial Blood glucose range (mg/dL) >200   200-300  ? Number of hypoglycemic episodes per month 2   ? Can you tell when your blood sugar is low? Yes   ? What do you do if your blood sugar is low? drinks a small amount of OJ   ? Number of hyperglycemic episodes per week 14   ? Can you tell when your blood sugar is high? Yes   ? What do you do if your blood sugar is high? drinks water, does more physical things in the house   ? Have you had a dilated eye exam in the past 12 months? Yes    ? Have you had a dental exam in the past 12 months? Yes   ? Are you checking your feet? Yes   ?  ? Dietary Intake  ? Breakfast cheese Grits, egg, occasional bacon, applesauce OR English Muffin (1/2) with canadian bacon, cheese and egg, applesauce   ? Snack (morning) none   ? Lunch soup OR sandwich OR vegetable plate [facility] Restaurant 1-2 times per week)   ? Snack (afternoon) none   ? Dinner salad with ham and cheese or taco salad OR ribeye steak, broccoli, 1/2 baked potato with sour cream   5-6  ? Snack (evening) none   ? Beverage(s) water, coke zero (1 per day), unsweetened tea, coffee with regular or sugar free creamer   ?  ? Exercise  ? Exercise Type ADL's   ?  ? Patient Education  ? Previous Diabetes Education Yes (please comment)   ? Disease state  Definition of diabetes, type 1 and 2, and the diagnosis of diabetes   ? Nutrition management  Role of diet in the treatment of diabetes and the relationship between the three main macronutrients and blood glucose level;Meal options for control of blood glucose level and chronic complications.   ? Physical activity and exercise  Role of exercise on diabetes management, blood pressure control and cardiac health.;Helped patient identify appropriate exercises in relation to his/her diabetes, diabetes complications and other health issue.   ? Medications Reviewed patients medication for diabetes, action, purpose, timing of dose and side effects.   ? Monitoring Identified appropriate SMBG and/or A1C goals.   ? Acute complications Taught treatment of hypoglycemia - the 15 rule.   ? Psychosocial adjustment Identified and addressed patients feelings and concerns about diabetes   ?  ? Individualized Goals (developed by patient)  ? Nutrition General guidelines for healthy choices and portions discussed   ? Physical Activity Exercise 3-5 times per week;15 minutes per day   arm chair exercises or housekeeping or gardening as tolerated  ? Medications take my medication as  prescribed   ? Monitoring  test my blood glucose as discussed   ? Reducing Risk examine blood glucose patterns;increase portions of healthy fats   ?  ? Post-Education Assessment  ? Patient understands the diabetes disease and treatment process. Demonstrates understanding / competency   ? Patient understands incorporating nutritional management into lifestyle. Demonstrates understanding / competency   ? Patient undertands incorporating physical activity into lifestyle. Demonstrates understanding / competency   ? Patient understands using medications safely. Demonstrates understanding / competency   ? Patient understands monitoring blood glucose, interpreting and using results Demonstrates understanding / competency   ? Patient understands prevention, detection, and treatment of acute complications. Demonstrates understanding / competency   ? Patient understands prevention, detection, and treatment of chronic complications.  Demonstrates understanding / competency   ? Patient understands how to develop strategies to address psychosocial issues. Demonstrates understanding / competency   ? Patient understands how to develop strategies to promote health/change behavior. Demonstrates understanding / competency   ?  ? Outcomes  ? Expected Outcomes Demonstrated interest in learning. Expect positive outcomes   ? Future DMSE PRN   ? Program Status Completed   ? ?  ?  ? ?  ? ? ?Individualized Plan for Diabetes Self-Management Training:  ? ?Learning Objective:  Patient will have a greater understanding of diabetes self-management. ?Patient education plan is to attend individual and/or group sessions per assessed needs and concerns. ?  ?Plan:  ? ?There are no Patient Instructions on file for this visit. ? ?Expected Outcomes:  Demonstrated interest in learning. Expect positive outcomes ? ?Education material provided: ADA - How to Thrive: A Guide for Your Journey with Diabetes and Meal plan card ? ?If problems or questions, patient  to contact team via:  Phone ? ?Future DSME appointment: PRN ?

## 2021-10-07 ENCOUNTER — Telehealth: Payer: Self-pay

## 2021-10-07 NOTE — Telephone Encounter (Signed)
**Note De-Identified  Obfuscation** Candise Bowens Key: [phone] - PA Case ID: [id number] - Rx #: [id number] ?Drug: Irbesartan '300MG'$  tablets ?Outcome: This medication or product is on your plan's list of covered drugs. Prior authorization is not required at this time.  ?Form ?[employer] Medicare Part D Electronic Prior Authorization Form (2017 NCPDP) ? ?I have notified [facility] [id number] - [place], [place] (Ph: [phone]) of this outcome. ?

## 2021-10-09 ENCOUNTER — Telehealth: Payer: Self-pay | Admitting: Internal Medicine

## 2021-10-09 NOTE — Telephone Encounter (Signed)
Patient called back to give BP reading for the last two weeks: ?3/26 152/70 ?3/27 151/66  ?3/28 152/76 ?3/29 140/69 ?3/30 147/63 ?3/31 142/75 ?4/1 117/67 ?4/2 114/77 ?4/3 129/71 ?4/4 144/98 ?4/5 Didn't take ?4/6 120/60 ?4/7 122/66 ?

## 2021-10-09 NOTE — Telephone Encounter (Signed)
? ? ?.  Pt c/o BP issue: STAT if pt c/o blurred vision, one-sided weakness or slurred speech ? ?1. What are your last 5 BP readings? 122/66 ? ?2. Are you having any other symptoms (ex. Dizziness, headache, blurred vision, passed out)? None  ? ?3. What is your BP issue? Pt said, she was asked to call back with her BP after 2 weeks since she is taking new BP meds. She said, as far as she knows the new meds is working and BP is controlled. Today her BP is at 122/66 ?

## 2021-10-09 NOTE — Telephone Encounter (Signed)
Called pt back in regards to BP readings.  Left a detailed message ok per DPR.  Asked pt to call in daily BP readings for the past 1-2 weeks for MD to review.  ?

## 2021-10-12 ENCOUNTER — Ambulatory Visit: Payer: Medicare Other | Admitting: Physician Assistant

## 2021-10-12 DIAGNOSIS — M1711 Unilateral primary osteoarthritis, right knee: Secondary | ICD-10-CM | POA: Diagnosis not present

## 2021-10-12 MED ORDER — LIDOCAINE HCL 1 % IJ SOLN
1.5000 mL | INTRAMUSCULAR | Status: AC | PRN
  Administered 2021-10-12: 1.5 mL via INTRA_ARTICULAR

## 2021-10-12 MED ORDER — HYLAN G-F 20 16 MG/2ML IX SOSY
16.0000 mg | PREFILLED_SYRINGE | INTRA_ARTICULAR | Status: AC | PRN
  Administered 2021-10-12: 16 mg via INTRA_ARTICULAR

## 2021-10-12 NOTE — Telephone Encounter (Signed)
Called pt to inform of MD comments in regards to BP readings.  Pt to continue current plan of care.  Pt has no questions or concerns.  ?

## 2021-10-12 NOTE — Progress Notes (Signed)
? ?  Procedure Note ? ?Patient: Annette Suarez             ?Date of Birth: Jul 29, 1940           ?MRN: [id number]             ?Visit Date: 10/12/2021 ? ?Procedures: ?Visit Diagnoses:  ?1. Primary osteoarthritis of right knee   ? ? ?Synvisc #1 right knee, B/B ?Large Joint Inj: R knee on 10/12/2021 11:21 AM ?Indications: pain ?Details: 25 G 1.5 in needle, medial approach ? ?Arthrogram: No ? ?Medications: 1.5 mL lidocaine 1 %; 16 mg Hylan 16 MG/2ML ?Aspirate: 0 mL ?Outcome: tolerated well, no immediate complications ?Procedure, treatment alternatives, risks and benefits explained, specific risks discussed. Consent was given by the patient. Immediately prior to procedure a time out was called to verify the correct patient, procedure, equipment, support staff and site/side marked as required. Patient was prepped and draped in the usual sterile fashion.  ? ? ?Patient tolerated the procedure well.  Aftercare was discussed.  ?Hazel Sams, PA-C  ? ?

## 2021-10-19 ENCOUNTER — Ambulatory Visit: Payer: Medicare Other | Admitting: Physician Assistant

## 2021-10-19 DIAGNOSIS — M1711 Unilateral primary osteoarthritis, right knee: Secondary | ICD-10-CM

## 2021-10-19 MED ORDER — LIDOCAINE HCL 1 % IJ SOLN
1.5000 mL | INTRAMUSCULAR | Status: AC | PRN
  Administered 2021-10-19: 1.5 mL via INTRA_ARTICULAR

## 2021-10-19 MED ORDER — HYLAN G-F 20 16 MG/2ML IX SOSY
16.0000 mg | PREFILLED_SYRINGE | INTRA_ARTICULAR | Status: AC | PRN
  Administered 2021-10-19: 16 mg via INTRA_ARTICULAR

## 2021-10-19 NOTE — Progress Notes (Signed)
? ?  Procedure Note ? ?Patient: Annette Suarez             ?Date of Birth: 06-09-1941           ?MRN: [id number]             ?Visit Date: 10/19/2021 ? ?Procedures: ?Visit Diagnoses:  ?1. Primary osteoarthritis of right knee   ? ? ?Synvisc #2 right knee, B/B ?Large Joint Inj: R knee on 10/19/2021 11:35 AM ?Indications: pain ?Details: 25 G 1.5 in needle, medial approach ? ?Arthrogram: No ? ?Medications: 1.5 mL lidocaine 1 %; 16 mg Hylan 16 MG/2ML ?Aspirate: 0 mL ?Outcome: tolerated well, no immediate complications ?Procedure, treatment alternatives, risks and benefits explained, specific risks discussed. Consent was given by the patient. Immediately prior to procedure a time out was called to verify the correct patient, procedure, equipment, support staff and site/side marked as required. Patient was prepped and draped in the usual sterile fashion.  ? ? ?Patient tolerated the procedure well.  Aftercare was discussed.  ?Hazel Sams, PA-C  ? ?

## 2021-10-23 ENCOUNTER — Telehealth: Payer: Self-pay

## 2021-10-23 NOTE — Telephone Encounter (Signed)
LMTCB

## 2021-10-23 NOTE — Telephone Encounter (Signed)
Patient states that she's in the donut hole and can't afford the Trulicity. Would like to know if there is a less expensive option.  ?

## 2021-10-23 NOTE — Telephone Encounter (Signed)
Patient will stop by and fill out the application for Ozempic ?

## 2021-10-26 ENCOUNTER — Ambulatory Visit: Payer: Medicare Other | Admitting: Physician Assistant

## 2021-10-26 DIAGNOSIS — M1711 Unilateral primary osteoarthritis, right knee: Secondary | ICD-10-CM | POA: Diagnosis not present

## 2021-10-26 MED ORDER — LIDOCAINE HCL 1 % IJ SOLN
1.5000 mL | INTRAMUSCULAR | Status: AC | PRN
  Administered 2021-10-26: 1.5 mL via INTRA_ARTICULAR

## 2021-10-26 MED ORDER — HYLAN G-F 20 16 MG/2ML IX SOSY
16.0000 mg | PREFILLED_SYRINGE | INTRA_ARTICULAR | Status: AC | PRN
  Administered 2021-10-26: 16 mg via INTRA_ARTICULAR

## 2021-10-26 NOTE — Progress Notes (Signed)
? ?  Procedure Note ? ?Patient: Annette Suarez             ?Date of Birth: 1940-11-03           ?MRN: [id number]             ?Visit Date: 10/26/2021 ? ?Procedures: ?Visit Diagnoses:  ?1. Primary osteoarthritis of right knee   ? ?Synvisc #3 right knee, B/B ?Large Joint Inj: R knee on 10/26/2021 11:01 AM ?Indications: pain ?Details: 25 G 1.5 in needle, medial approach ? ?Arthrogram: No ? ?Medications: 1.5 mL lidocaine 1 %; 16 mg Hylan 16 MG/2ML ?Aspirate: 0 mL ?Outcome: tolerated well, no immediate complications ?Procedure, treatment alternatives, risks and benefits explained, specific risks discussed. Consent was given by the patient. Immediately prior to procedure a time out was called to verify the correct patient, procedure, equipment, support staff and site/side marked as required. Patient was prepped and draped in the usual sterile fashion.  ? ?Patient tolerated the procedure well.  Aftercare was discussed.  ?Hazel Sams, PA-C  ? ? ?

## 2021-10-27 ENCOUNTER — Telehealth: Payer: Self-pay

## 2021-10-27 NOTE — Telephone Encounter (Signed)
[employer] for [employer]. ?

## 2021-11-10 ENCOUNTER — Encounter: Payer: Self-pay | Admitting: Internal Medicine

## 2021-11-10 ENCOUNTER — Ambulatory Visit: Payer: Medicare Other | Admitting: Internal Medicine

## 2021-11-10 VITALS — BP 124/76 | HR 74 | Ht 62.0 in | Wt 156.8 lb

## 2021-11-10 DIAGNOSIS — E1122 Type 2 diabetes mellitus with diabetic chronic kidney disease: Secondary | ICD-10-CM

## 2021-11-10 DIAGNOSIS — R739 Hyperglycemia, unspecified: Secondary | ICD-10-CM

## 2021-11-10 DIAGNOSIS — M79674 Pain in right toe(s): Secondary | ICD-10-CM

## 2021-11-10 DIAGNOSIS — N183 Chronic kidney disease, stage 3 unspecified: Secondary | ICD-10-CM | POA: Diagnosis not present

## 2021-11-10 LAB — POCT GLYCOSYLATED HEMOGLOBIN (HGB A1C): Hemoglobin A1C: 7.6 % — AB (ref 4.0–5.6)

## 2021-11-10 MED ORDER — OZEMPIC (0.25 OR 0.5 MG/DOSE) 2 MG/1.5ML ~~LOC~~ SOPN: 0.5000 mg | PEN_INJECTOR | SUBCUTANEOUS | 0 refills | Status: DC

## 2021-11-10 MED ORDER — TRESIBA FLEXTOUCH 200 UNIT/ML ~~LOC~~ SOPN
55.0000 [IU] | PEN_INJECTOR | Freq: Every day | SUBCUTANEOUS | 6 refills | Status: DC
Start: 2021-11-10 — End: 2022-03-09

## 2021-11-10 NOTE — Progress Notes (Signed)
?Name: Annette Suarez  ?Age/ Sex: 81 y.o., female   ?MRN/ DOB: [id number], February 07, 1941    ? ?PCP: [name], NP   ?Reason for Endocrinology Evaluation: Type 2 Diabetes Mellitus  ?Initial Endocrine Consultative Visit: 05/11/2021  ? ? ?PATIENT IDENTIFIER: Annette Suarez is a 81 y.o. female with a past medical history of T2DM, HTN, RA and dyslipidemia. The patient has followed with Endocrinology clinic since 05/11/2021 for consultative assistance with management of her diabetes. ? ?DIABETIC HISTORY:  ?Annette Suarez was diagnosed with DM in ~ 1995 . She is intolerant to Metformin- nausea and Jardiance due to UTI's .Her hemoglobin A1c has ranged from 7.5% in 2021, peaking at 8.6% in 2023. ? ?Transitioned care from Dr. Loanne Drilling 08/2021 ? ?Started trulicity in 59/1638 ? ?THYROID HISTORY : ?She has been diagnosed with MNG based on thyroid ultrasound in 2011 showing multiple thyroid nodules. She is s/p benign cytology of the left middle and left inferior nodules in 2011. Ultrasound in 2021 confirmed at least a 5 year stability  ? ? ?SUBJECTIVE:  ? ?During the last visit (07/14/2021): A1c 8.6% . Saw Dr. Loanne Drilling  ? ?Today (11/10/2021): [name] is here to establish care for diabetes. She has been checking blood sugars multiple times daily through CGM. The patient has had hypoglycemic episodes since the last clinic visit, she is symptomatic with these episodes.  ? ?Trulicity is cost prohibitive  ?Pending PA for Ozempic , using samples 0.5 mg  ? ?Neck size stable  ?Denies nausea, vomiting or diarrhea  ?No recent LE swelling  ?She has a right great toe pain ? ? ? ?H/OME DIABETES REGIMEN:  ?Tresiba 60 units daily  ?Ozempic 0.5 mg weekly ( Weekly)  ? ? ?Statin: yes ?ACE-I/ARB: yes  ? ? ? ?CONTINUOUS GLUCOSE MONITORING RECORD INTERPRETATION   ? ?Dates of Recording: 4/26-11/10/2021 ? ?Sensor description:freestyle libre ? ?Results statistics: ?  ?CGM use % of time 87  ?Average and SD 173/29.6  ?Time in range     54   %  ?% Time Above 180 39  ?%  Time above 250 7  ?% Time Below target 0  ? ? ? ?Glycemic patterns summary: Bg's optimal in the evening but rise during the day  ? ?Hyperglycemic episodes  postprandial  ? ?Hypoglycemic episodes occurred n/a ? ?Overnight periods: optimal  ? ? ? ? ? ? ?DIABETIC COMPLICATIONS: ?Microvascular complications:  ?CKD III ?Denies: retinopathy, neuropathy  ?Last Eye Exam: Completed 07/2020 ? ?Macrovascular complications:  ? ?Denies: CAD, CVA, PVD ? ? ?HISTORY:  ?Past Medical History:  ?Past Medical History:  ?Diagnosis Date  ? Acute cystitis   ? UTI  ? Anxiety   ? Arthritis   ? Benign positional vertigo   ? Dermatitis   ? Diabetes mellitus without complication ([place])   ? Family history of adverse reaction to anesthesia   ? GERD (gastroesophageal reflux disease)   ? Goiter, nontoxic, multinodular   ? Gout   ? H/O: hysterectomy   ? History of tobacco use   ? Hx laparoscopic cholecystectomy   ? Hypercholesterolemia   ? Hypertension   ? Incomplete bladder emptying   ? Insomnia   ? Postmenopausal atrophic vaginitis   ? RA (rheumatoid arthritis) ([place])   ? Rectocele   ? Renal calculus   ? Sleep apnea   ? Thyroid condition   ? Type 2 diabetes mellitus ([place])   ? Urolithiasis   ? Vaginal enterocele   ? ?Past Surgical History:  ?  Past Surgical History:  ?Procedure Laterality Date  ? CARPAL TUNNEL RELEASE  2018  ? CATARACT EXTRACTION, BILATERAL    ? CHOLECYSTECTOMY    ? COLONOSCOPY    ? 07/11/2009, 02/2015  ? GALLBLADDER SURGERY    ? KNEE ARTHROSCOPY Right 05/20/2020  ? Procedure: RIGHT KNEE ARTHROSCOPY AND DEBRIDEMENT;  Surgeon: [name], MD;  Location: [facility];  Service: Orthopedics;  Laterality: Right;  ? PARTIAL HYSTERECTOMY    ? Overies left intact  ? SQUAMOUS CELL CARCINOMA EXCISION  05/2018  ? ?Social History:  reports that she quit smoking about 43 years ago. Her smoking use included cigarettes. She has a 2.00 pack-year smoking history. She has never used smokeless tobacco. She reports that she does not drink  alcohol and does not use drugs. ?Family History:  ?Family History  ?Problem Relation Age of Onset  ? Diabetes Mother   ? Heart disease Father 38  ? Diabetes Son   ? Crohn's disease Son   ? ? ? ?HOME MEDICATIONS: ?Allergies as of 11/10/2021   ? ?   Reactions  ? Amaryl [glimepiride]   ? Don't Remember  ? Amlodipine   ? LE edema on 7.'5mg'$  daily. Tolerates '5mg'$  daily just fine  ? Codeine   ? SICK  ? Jardiance [empagliflozin]   ? Urinary tract infection- SIDE EFFECT  ? Magnesium Citrate   ? Strong urine odor and dysuria  ? Pneumovax 23 [pneumococcal Vac Polyvalent]   ? Red / Swelling / Hot to touch SIDE EFFECTS  ? Wellbutrin [bupropion]   ? Unknown  ? Latex Rash  ? ?  ? ?  ?Medication List  ?  ? ?  ? Accurate as of Nov 10, 2021 11:04 AM. If you have any questions, ask your nurse or doctor.  ?  ?  ? ?  ? ?STOP taking these medications   ? ?insulin NPH Human 100 UNIT/ML injection ?Commonly known as: HumuLIN N ?Stopped by: Dorita Sciara, MD ?  ?Trulicity 1.5 RX/5.4MG Sopn ?Generic drug: Dulaglutide ?Stopped by: Dorita Sciara, MD ?  ? ?  ? ?TAKE these medications   ? ?Accu-Chek Guide test strip ?Generic drug: glucose blood ?USE TO CHECK BLOOD SUGAR THREE TIMES A DAY ?  ?Accu-Chek Softclix Lancets lancets ?Use to test blood sugar three times daily. Dx: E11.8 ?  ?aspirin EC 81 MG tablet ?Take 81 mg by mouth daily. ?  ?Biotin 1 MG Caps ?Take by mouth. ?  ?carvedilol 6.25 MG tablet ?Commonly known as: COREG ?Take 1 tablet (6.25 mg total) by mouth 2 (two) times daily. ?  ?chlorthalidone 25 MG tablet ?Commonly known as: HYGROTON ?Take 1 tablet (25 mg total) by mouth daily. ?  ?CRANBERRY PO ?Take by mouth daily. ?  ?Enbrel SureClick 50 MG/ML injection ?Generic drug: etanercept ?Inject 50 mg into the skin once a week. ?  ?estradiol 0.1 MG/GM vaginal cream ?Commonly known as: ESTRACE ?Place 1 Applicatorful vaginally 3 (three) times a week. ?  ?fluocinonide 0.05 % external solution ?Commonly known as: LIDEX ?Apply 1  application topically daily as needed. ?  ?FreeStyle Libre 2 Sensor Misc ?1 application by Does not apply route every 14 (fourteen) days. ?  ?Insulin Pen Needle 31G X 5 MM Misc ?1 Device by Does not apply route daily in the afternoon. ?  ?irbesartan 300 MG tablet ?Commonly known as: AVAPRO ?Take 1 tablet (300 mg total) by mouth daily. ?  ?loperamide 2 MG tablet ?Commonly known as: IMODIUM A-D ?  Take 2 mg by mouth as needed for diarrhea or loose stools. ?  ?LORazepam 0.5 MG tablet ?Commonly known as: ATIVAN ?TAKE ONE TABLET BY MOUTH EVERY NIGHT AT BEDTIME ?  ?MELATONIN PO ?Take 10 mg by mouth at bedtime as needed. ?  ?nitroGLYCERIN 0.4 MG SL tablet ?Commonly known as: NITROSTAT ?Place 1 tablet (0.4 mg total) under the tongue every 5 (five) minutes as needed for chest pain. ?  ?Omeprazole 20 MG Tbdd ?Take 20 mg by mouth daily. ?  ?ondansetron 4 MG tablet ?Commonly known as: ZOFRAN ?Take 4 mg by mouth 3 (three) times daily as needed for nausea or vomiting. ?  ?PROBIOTIC PO ?Take by mouth daily. ?  ?simvastatin 20 MG tablet ?Commonly known as: ZOCOR ?TAKE ONE TABLET BY MOUTH EVERY EVENING ?  ?Tyler Aas FlexTouch 200 UNIT/ML FlexTouch Pen ?Generic drug: insulin degludec ?Inject 60 Units into the skin daily in the afternoon. ?  ?VOLTAREN EX ?Apply 1 application topically 3 (three) times daily as needed. ?  ? ?  ? ? ? ?OBJECTIVE:  ? ?Vital Signs: BP 124/76 (BP Location: Left Arm, Patient Position: Sitting, Cuff Size: Small)   Pulse 74   Ht '5\' 2"'$  (1.575 m)   Wt 156 lb 12.8 oz (71.1 kg)   SpO2 96%   BMI 28.68 kg/m?   ?Wt Readings from Last 3 Encounters:  ?11/10/21 156 lb 12.8 oz (71.1 kg)  ?10/05/21 160 lb (72.6 kg)  ?09/25/21 157 lb 6.4 oz (71.4 kg)  ? ? ? ?Exam: ?General: Pt appears well and is in NAD  ?Neck: General: Supple without adenopathy. ?Thyroid: Thyroid is prominent on the right  ?Lungs: Clear with good BS bilat with no rales, rhonchi, or wheezes  ?Heart: RRR with normal S1 and S2 and no gallops; no murmurs; no  rub  ?Abdomen: Normoactive bowel sounds, soft, nontender, without masses or organomegaly palpable  ?Extremities: No pretibial edema.  ?Neuro: MS is good with appropriate affect, pt is alert and Ox3  ? ?DM Foot Exam

## 2021-11-10 NOTE — Patient Instructions (Addendum)
Decrease Tresiba 55 units ONCE Daily  ?Continue  Ozempic 0.5 mg weekly  ? ? ? ?HOW TO TREAT LOW BLOOD SUGARS (Blood sugar LESS THAN 70 MG/DL) ?Please follow the RULE OF 15 for the treatment of hypoglycemia treatment (when your (blood sugars are less than 70 mg/dL)  ? ?STEP 1: Take 15 grams of carbohydrates when your blood sugar is low, which includes:  ?3-4 GLUCOSE TABS  OR ?3-4 OZ OF JUICE OR REGULAR SODA OR ?ONE TUBE OF GLUCOSE GEL   ? ?STEP 2: RECHECK blood sugar in 15 MINUTES ?STEP 3: If your blood sugar is still low at the 15 minute recheck --> then, go back to STEP 1 and treat AGAIN with another 15 grams of carbohydrates. ? ?

## 2021-11-24 ENCOUNTER — Ambulatory Visit: Payer: Medicare Other

## 2021-11-24 ENCOUNTER — Encounter: Payer: Self-pay | Admitting: Podiatry

## 2021-11-24 ENCOUNTER — Ambulatory Visit: Payer: Medicare Other | Admitting: Podiatry

## 2021-11-24 DIAGNOSIS — B351 Tinea unguium: Secondary | ICD-10-CM

## 2021-11-24 DIAGNOSIS — E1142 Type 2 diabetes mellitus with diabetic polyneuropathy: Secondary | ICD-10-CM

## 2021-11-24 DIAGNOSIS — M79676 Pain in unspecified toe(s): Secondary | ICD-10-CM

## 2021-11-24 DIAGNOSIS — M2041 Other hammer toe(s) (acquired), right foot: Secondary | ICD-10-CM

## 2021-11-24 NOTE — Progress Notes (Signed)
Subjective:  Patient ID: Annette Suarez, female    DOB: [birth year],  MRN: [id number] HPI Chief Complaint  Patient presents with   Diabetes    Diabetic Foot Exam - tenderness around nail right hallux, concerned could be gout again, also interested in getting all the nails trimmed, last a1c was 7.6   New Patient (Initial Visit)    81 y.o. female presents with the above complaint.   ROS: Denies fever chills nausea vomiting muscle aches pains calf pain back pain chest pain shortness of breath.  Past Medical History:  Diagnosis Date   Acute cystitis    UTI   Anxiety    Arthritis    Benign positional vertigo    Dermatitis    Diabetes mellitus without complication ([place])    Family history of adverse reaction to anesthesia    GERD (gastroesophageal reflux disease)    Goiter, nontoxic, multinodular    Gout    H/O: hysterectomy    History of tobacco use    Hx laparoscopic cholecystectomy    Hypercholesterolemia    Hypertension    Incomplete bladder emptying    Insomnia    Postmenopausal atrophic vaginitis    RA (rheumatoid arthritis) ([place])    Rectocele    Renal calculus    Sleep apnea    Thyroid condition    Type 2 diabetes mellitus ([place])    Urolithiasis    Vaginal enterocele    Past Surgical History:  Procedure Laterality Date   CARPAL TUNNEL RELEASE  2018   CATARACT EXTRACTION, BILATERAL     CHOLECYSTECTOMY     COLONOSCOPY     07/11/2009, 02/2015   GALLBLADDER SURGERY     KNEE ARTHROSCOPY Right 05/20/2020   Procedure: RIGHT KNEE ARTHROSCOPY AND DEBRIDEMENT;  Surgeon: [name], MD;  Location: [facility];  Service: Orthopedics;  Laterality: Right;   PARTIAL HYSTERECTOMY     Overies left intact   SQUAMOUS CELL CARCINOMA EXCISION  05/2018    Current Outpatient Medications:    Semaglutide,0.25 or 0.'5MG'$ /DOS, (OZEMPIC, 0.25 OR 0.5 MG/DOSE,) 2 MG/1.5ML SOPN, Inject 0.5 mg into the skin once a week., Disp: 3 mL, Rfl: 0   Accu-Chek Softclix Lancets lancets,  Use to test blood sugar three times daily. Dx: E11.8, Disp: 300 each, Rfl: 1   aspirin EC 81 MG tablet, Take 81 mg by mouth daily., Disp: , Rfl:    Biotin 1 MG CAPS, Take by mouth., Disp: , Rfl:    carvedilol (COREG) 6.25 MG tablet, Take 1 tablet (6.25 mg total) by mouth 2 (two) times daily., Disp: 180 tablet, Rfl: 3   chlorthalidone (HYGROTON) 25 MG tablet, Take 1 tablet (25 mg total) by mouth daily., Disp: 90 tablet, Rfl: 3   Continuous Blood Gluc Receiver (FREESTYLE LIBRE 2 READER) DEVI, , Disp: , Rfl:    Continuous Blood Gluc Sensor (FREESTYLE LIBRE 2 SENSOR) MISC, 1 application by Does not apply route every 14 (fourteen) days., Disp: 2 each, Rfl: 3   CRANBERRY PO, Take by mouth daily., Disp: , Rfl:    Diclofenac Sodium (VOLTAREN EX), Apply 1 application topically 3 (three) times daily as needed., Disp: , Rfl:    estradiol (ESTRACE) 0.1 MG/GM vaginal cream, Place 1 Applicatorful vaginally 3 (three) times a week., Disp: 42.5 g, Rfl: 0   etanercept (ENBREL SURECLICK) 50 MG/ML injection, Inject 50 mg into the skin once a week., Disp: 12 mL, Rfl: 0   fluocinonide (LIDEX) 0.05 % external solution, Apply 1 application  topically daily as needed., Disp: , Rfl:    glucose blood (ACCU-CHEK GUIDE) test strip, USE TO CHECK BLOOD SUGAR THREE TIMES A DAY, Disp: 300 strip, Rfl: 1   insulin degludec (TRESIBA FLEXTOUCH) 200 UNIT/ML FlexTouch Pen, Inject 56 Units into the skin daily in the afternoon., Disp: 30 mL, Rfl: 6   Insulin Pen Needle 31G X 5 MM MISC, 1 Device by Does not apply route daily in the afternoon., Disp: 100 each, Rfl: 3   irbesartan (AVAPRO) 300 MG tablet, Take 1 tablet (300 mg total) by mouth daily., Disp: 90 tablet, Rfl: 3   loperamide (IMODIUM A-D) 2 MG tablet, Take 2 mg by mouth as needed for diarrhea or loose stools., Disp: , Rfl:    LORazepam (ATIVAN) 0.5 MG tablet, TAKE ONE TABLET BY MOUTH EVERY NIGHT AT BEDTIME, Disp: 30 tablet, Rfl: 5   MELATONIN PO, Take 10 mg by mouth at bedtime as  needed., Disp: , Rfl:    nitroGLYCERIN (NITROSTAT) 0.4 MG SL tablet, Place 1 tablet (0.4 mg total) under the tongue every 5 (five) minutes as needed for chest pain., Disp: 25 tablet, Rfl: 3   Omeprazole 20 MG TBDD, Take 20 mg by mouth daily., Disp: , Rfl:    ondansetron (ZOFRAN) 4 MG tablet, Take 4 mg by mouth 3 (three) times daily as needed for nausea or vomiting., Disp: , Rfl:    Probiotic Product (PROBIOTIC PO), Take by mouth daily., Disp: , Rfl:    simvastatin (ZOCOR) 20 MG tablet, TAKE ONE TABLET BY MOUTH EVERY EVENING, Disp: 90 tablet, Rfl: 1  Allergies  Allergen Reactions   Amaryl [Glimepiride]     Don't Remember   Amlodipine     LE edema on 7.'5mg'$  daily. Tolerates '5mg'$  daily just fine   Codeine     SICK   Jardiance [Empagliflozin]     Urinary tract infection- SIDE EFFECT   Magnesium Citrate     Strong urine odor and dysuria   Pneumovax 23 [Pneumococcal Vac Polyvalent]     Red / Swelling / Hot to touch SIDE EFFECTS   Wellbutrin [Bupropion]     Unknown   Latex Rash   Review of Systems Objective:  There were no vitals filed for this visit.  General: Well developed, nourished, in no acute distress, alert and oriented x3   Dermatological: Skin is warm, dry and supple bilateral. Nails x 10 are well maintained but are a long thick yellow dystrophic clinical mycotic; remaining integument appears unremarkable at this time. There are no open sores, no preulcerative lesions, no rash or signs of infection present.  Vascular: Dorsalis Pedis artery and Posterior Tibial artery pedal pulses are 2/4 bilateral with immedate capillary fill time. Pedal hair growth present. No varicosities and no lower extremity edema present bilateral.   Neruologic: Grossly intact via light touch bilateral. Vibratory intact via tuning fork bilateral. Protective threshold with Semmes Wienstein monofilament intact to all pedal sites bilateral. Patellar and Achilles deep tendon reflexes 2+ bilateral. No Babinski or  clonus noted bilateral.   Musculoskeletal: No gross boney pedal deformities bilateral. No pain, crepitus, or limitation noted with foot and ankle range of motion bilateral. Muscular strength 5/5 in all groups tested bilateral.  Gait: Unassisted, Nonantalgic.    Radiographs:  None taken  Assessment & Plan:   Assessment: Pain in limb secondary to onychomycosis.  Diabetes mellitus with no complications.  Plan: Debridement of toenails 1 through 5 bilateral.  She will follow-up with one of our doctors for routine  care.     Courtnei Ruddell T. Al[place], [place]

## 2021-12-02 ENCOUNTER — Other Ambulatory Visit: Payer: Self-pay | Admitting: Orthopedic Surgery

## 2021-12-02 DIAGNOSIS — Z794 Long term (current) use of insulin: Secondary | ICD-10-CM

## 2021-12-10 ENCOUNTER — Telehealth: Payer: Self-pay

## 2021-12-10 NOTE — Telephone Encounter (Signed)
Patient advised that Ozempic  4 boxes of patient assistance is ready for pick up.

## 2021-12-14 NOTE — Telephone Encounter (Signed)
Patient called in having issues on where to stop at 0.'5mg'$  on the '1mg'$  pen. She will come to office tomorrow and we will instruct her how.

## 2021-12-15 ENCOUNTER — Telehealth: Payer: Self-pay

## 2021-12-15 NOTE — Telephone Encounter (Signed)
Patient doesn't fill comfortable with the '1mg'$  pen of Ozempic and wants to have script portion sent for the 0.'5mg'$  dose, so she doesn't give herself to much medicine. She is not sure why it was sent to for the '1mg'$  to patient assistance. Is this okay to change

## 2021-12-16 NOTE — Telephone Encounter (Signed)
Patient was advised and states that she would like to speak with you because she doesn't remember having a conversation about increasing Ozempic. Patient is aware that you are out of office. I appointments coming up for her to come in and discuss.

## 2021-12-16 NOTE — Telephone Encounter (Signed)
Vm left for patient to call back.

## 2021-12-18 NOTE — Progress Notes (Addendum)
Office Visit Note  Patient: Annette Suarez             Date of Birth: 1941/04/14           MRN: [id number]             PCP: [name], NP Referring: [name], NP Visit Date: 12/30/2021 Occupation: '@GUAROCC'$ @  Subjective:  Joint Pain (Has good days and bad days )   History of Present Illness: Annette Suarez is a 81 y.o. female with history of seronegative rheumatoid arthritis and osteoarthritis.  She states she has good days and bad days.  She has episodic increased pain all over which lasted for 24 hours.  She has not noticed any joint swelling.  She continues to have some pain and stiffness in her bilateral hands, knees and her feet.  She has intermittent discomfort in her bilateral trochanteric bursa.  She continues to have  muscle cramps early in the morning.  She has been taking Enbrel 50 mg subcu weekly.  She needs a refill today.  She experiences lower back pain when she vacuums her floors.  Otherwise she is not having lower back pain.  She denies any radiculopathy.  Activities of Daily Living:  Patient reports morning stiffness for 2 hours.   Patient Denies nocturnal pain.  Difficulty dressing/grooming: Reports Difficulty climbing stairs: Reports Difficulty getting out of chair: Reports Difficulty using hands for taps, buttons, cutlery, and/or writing: Reports  Review of Systems  Constitutional:  Positive for fatigue.  HENT:  Negative for mouth dryness.   Eyes: Negative.  Negative for dryness.  Respiratory: Negative.  Negative for shortness of breath.   Cardiovascular: Negative.  Negative for chest pain and palpitations.  Gastrointestinal:  Negative for blood in stool, constipation and diarrhea.  Endocrine: Positive for cold intolerance. Negative for increased urination.  Genitourinary: Negative.  Negative for involuntary urination.  Musculoskeletal:  Positive for joint pain, gait problem, joint pain, myalgias, morning stiffness and myalgias. Negative for joint swelling.   Skin:  Positive for color change. Negative for rash and sensitivity to sunlight.  Allergic/Immunologic: Negative for susceptible to infections.       Denies recent infections   Neurological:  Positive for weakness.       Hands weak states dropping things both hands   Hematological: Negative.  Negative for swollen glands.  Psychiatric/Behavioral:  Positive for sleep disturbance. Negative for depressed mood. The patient is not nervous/anxious.     PMFS History:  Patient Active Problem List   Diagnosis Date Noted   Type 2 diabetes mellitus with hyperglycemia, with long-term current use of insulin ([place]) 08/13/2021   Type 2 diabetes mellitus with stage 3a chronic kidney disease, with long-term current use of insulin ([place]) 08/13/2021   Multinodular goiter 08/13/2021   Subclavian arterial stenosis ([place]) 09/11/2020   Aortic atherosclerosis ([place]) 09/11/2020   Alteration in blood pressure 06/11/2020   Old complex tear of medial meniscus of right knee    Old complex tear of lateral meniscus of right knee    Primary osteoarthritis of both feet 12/13/2017   Dehydration 09/09/2017   Acute lower UTI 09/09/2017   Nausea & vomiting 09/08/2017   Primary osteoarthritis of both hands 04/05/2017   Left carpal tunnel syndrome 11/15/2016   Rheumatoid arthritis of multiple sites with negative rheumatoid factor ([place]) 10/06/2016   High risk medication use 10/06/2016   Dyslipidemia 10/06/2016   Pain in both hands 09/08/2016   Hypertension associated with diabetes ([place])  09/08/2016   Type 2 diabetes mellitus  09/08/2016   Gastroesophageal reflux disease without esophagitis 09/08/2016   Osteopenia of multiple sites 09/08/2016   History of anxiety 09/08/2016   Benign paroxysmal positional vertigo 09/08/2016   Sleep apnea 09/08/2016    Past Medical History:  Diagnosis Date   Acute cystitis    UTI   Anxiety    Arthritis    Benign positional vertigo    Dermatitis    Diabetes mellitus without  complication (HCC)    Family history of adverse reaction to anesthesia    GERD (gastroesophageal reflux disease)    Goiter, nontoxic, multinodular    Gout    H/O: hysterectomy    History of tobacco use    Hx laparoscopic cholecystectomy    Hypercholesterolemia    Hypertension    Incomplete bladder emptying    Insomnia    Postmenopausal atrophic vaginitis    RA (rheumatoid arthritis) ([place])    Rectocele    Renal calculus    Sleep apnea    Thyroid condition    Type 2 diabetes mellitus ([place])    Urolithiasis    Vaginal enterocele     Family History  Problem Relation Age of Onset   Diabetes Mother    Heart disease Father 2   Diabetes Son    Crohn's disease Son    Past Surgical History:  Procedure Laterality Date   CARPAL TUNNEL RELEASE  2018   CATARACT EXTRACTION, BILATERAL     CHOLECYSTECTOMY     COLONOSCOPY     07/11/2009, 02/2015   GALLBLADDER SURGERY     KNEE ARTHROSCOPY Right 05/20/2020   Procedure: RIGHT KNEE ARTHROSCOPY AND DEBRIDEMENT;  Surgeon: [name], MD;  Location: [facility];  Service: Orthopedics;  Laterality: Right;   PARTIAL HYSTERECTOMY     Overies left intact   SQUAMOUS CELL CARCINOMA EXCISION  05/2018   Social History   Social History Narrative   Diet: Blank      Do you drink/ eat things with caffeine? Yes      Marital status:  Married                             What year were you married ? 196      Do you live in a house, apartment,assistred living, condo, trailer, etc.)? House      Is it one or more stories? 1      How many persons live in your home ? 2      Do you have any pets in your home ?(please list)  1 Sam-Dog      Highest Level of education completed: 12      Current or past profession: Office      Do you exercise?   Some                           Type & how often  Walk Daily      ADVANCED DIRECTIVES (Please bring copies)      Do you have a living will? Blank      Do you have a DNR form?  Blank                      If not, do you want to discuss one?       Do you have signed POA?HPOA forms?  Yes  If so, please bring to your appointment      FUNCTIONAL STATUS- To be completed by Spouse / child / Staff       Do you have difficulty bathing or dressing yourself ?  No      Do you have difficulty preparing food or eating ?  No      Do you have difficulty managing your mediation ?  No      Do you have difficulty managing your finances ?  No      Do you have difficulty affording your medication ?  Yes (Some)      Immunization History  Administered Date(s) Administered   Fluad Quad(high Dose 65+) 04/09/2021   Influenza Split 03/28/2008, 03/05/2010, 04/17/2012, 04/20/2013   Influenza, High Dose Seasonal PF 02/08/2020   Influenza-Unspecified 05/11/2011, 04/22/2014, 04/15/2015, 02/20/2016, 03/02/2017, 03/09/2018, 03/20/2019, 03/21/2020   PFIZER(Purple Top)SARS-COV-2 Vaccination 07/24/2019, 08/06/2019, 08/27/2019, 04/21/2020   Pneumococcal Conjugate-13 05/02/2014   Pneumococcal Polysaccharide-23 04/23/2008   Tdap 05/01/2013   Zoster, Live 04/17/2012     Objective: Vital Signs: BP (!) 148/87   Pulse 78   Ht '5\' 2"'$  (1.575 m)   Wt 155 lb (70.3 kg)   BMI 28.35 kg/m    Physical Exam Vitals and nursing note reviewed.  Constitutional:      Appearance: She is well-developed.  HENT:     Head: Normocephalic and atraumatic.  Eyes:     Conjunctiva/sclera: Conjunctivae normal.  Cardiovascular:     Rate and Rhythm: Normal rate and regular rhythm.     Heart sounds: Normal heart sounds.  Pulmonary:     Effort: Pulmonary effort is normal.     Breath sounds: Normal breath sounds.  Abdominal:     General: Bowel sounds are normal.     Palpations: Abdomen is soft.  Musculoskeletal:     Cervical back: Normal range of motion.  Lymphadenopathy:     Cervical: No cervical adenopathy.  Skin:    General: Skin is warm and dry.     Capillary Refill: Capillary refill takes less than 2  seconds.  Neurological:     Mental Status: She is alert and oriented to person, place, and time.  Psychiatric:        Behavior: Behavior normal.      Musculoskeletal Exam: She has some limitation with range of motion of the cervical spine without discomfort.  She had thoracic kyphosis.  There was no tenderness over lumbar spine.  Shoulder joints, elbow joints, wrist joints with good range of motion.  She had bilateral CMC PIP and DIP thickening with no synovitis.  Hip joints with good range of motion.  She had limited extension of right knee joint without any warmth symptoms swelling.  Left knee joint was in full range of motion.  There was no tenderness over ankles or MTPs.  CDAI Exam: CDAI Score: 0.4  Patient Global: 2 mm; Provider Global: 2 mm Swollen: 0 ; Tender: 0  Joint Exam 12/30/2021   No joint exam has been documented for this visit   There is currently no information documented on the homunculus. Go to the Rheumatology activity and complete the homunculus joint exam.  Investigation: No additional findings.  Imaging: No results found.  Recent Labs: Lab Results  Component Value Date   WBC 9.6 07/29/2021   HGB 12.7 07/29/2021   PLT 332 07/29/2021   NA 141 09/07/2021   K 4.0 09/07/2021   CL 100 09/07/2021   CO2 27 09/07/2021   GLUCOSE  147 (H) 09/07/2021   BUN 20 09/07/2021   CREATININE 1.15 (H) 09/07/2021   BILITOT 1.0 07/29/2021   ALKPHOS 83 07/25/2019   AST 10 07/29/2021   ALT 10 07/29/2021   PROT 7.0 07/29/2021   ALBUMIN 4.6 07/25/2019   CALCIUM 9.3 09/07/2021   GFRAA 43 (L) 01/09/2021   QFTBGOLDPLUS NEGATIVE 07/29/2021    Speciality Comments: Recieves Enbrel through [facility] 02/20/2019 Osteopenia managed by Dr. Annice Pih 02/20/2019  Procedures:  No procedures performed Allergies: Amaryl [glimepiride], Amlodipine, Codeine, Jardiance [empagliflozin], Magnesium citrate, Pneumovax 23 [pneumococcal vac polyvalent], Wellbutrin  [bupropion], and Latex   Assessment / Plan:     Visit Diagnoses: Rheumatoid arthritis of multiple sites with negative rheumatoid factor (HCC)-patient had no synovitis on examination.  She has been taking Enbrel 50 mg subcu weekly without any side effects. She continues to have pain and discomfort in her joints which I believe is due to underlying osteoarthritis.  Patient states she has not had swelling in her joints in a long time.  I discussed the spacing Enbrel 50 mg subcu every other week as tolerated.  If she starts developing swelling then she can go back to weekly dosing.  We will send prescription refill for Enbrel today.  High risk medication use - - Enbrel 50 mg sq injections once weekly. (inadequate response to [place]). -Labs obtained in July 29, 2021 CBC was normal.  CMP was normal.  TB gold was negative.  Plan: CBC with Differential/Platelet, COMPLETE METABOLIC PANEL WITH GFR today and then every 3 months to monitor for drug toxicity.  Information about immunization was placed in the AVS.  She was advised to hold Enbrel if she develops an infection and resume after the infection resolves.  Annual skin examination to screen for skin cancer was advised she is on Enbrel.  Primary osteoarthritis of both hands-bilateral PIP and DIP thickening was noted.  No synovitis was noted.  Joint protection muscle strengthening was discussed.  Trochanteric bursitis, right hip-she has intermittent discomfort.  IT band stretches were demonstrated and discussed.  Primary osteoarthritis of right knee-she has off-and-on discomfort in her right knee joint.  She had previous surgery.  She had limited extension without any warmth swelling or effusion.  Primary osteoarthritis of both feet-proper fitting shoes were discussed.  DDD (degenerative disc disease), lumbar - X-rays from March 31, 2017 done by Dr. Kenton Kingfisher were reviewed which showed multilevel spondylosis and facet joint arthropathy.  She has been  experiencing increased lower back pain especially when she vacuums.  I offered physical therapy which she declined.  A handout on back exercises was given.  Osteopenia of multiple sites-repeat DEXA scan is pending.  Essential hypertension-blood pressure was elevated.  She was advised to monitor blood pressure closely.  Other medical problems listed as follows:  History of hyperlipidemia  Type 2 diabetes mellitus   Nocturnal muscle cramps  Gastroesophageal reflux disease without esophagitis  History of vertigo  History of sleep apnea  History of anxiety  Former smoker  Orders: Orders Placed This Encounter  Procedures   CBC with Differential/Platelet   COMPLETE METABOLIC PANEL WITH GFR   Meds ordered this encounter  Medications   etanercept (ENBREL SURECLICK) 50 MG/ML injection    Sig: Inject 50 mg into the skin once a week.    Dispense:  12 mL    Refill:  0     Follow-Up Instructions: Return in about 5 months (around 06/01/2022) for Rheumatoid arthritis, Osteoarthritis.   Bo Merino, MD  Note - This record has been created using Dragon software.  Chart creation errors have been sought, but may not always  have been located. Such creation errors do not reflect on  the standard of medical care.  

## 2021-12-21 NOTE — Telephone Encounter (Signed)
Patient notified and paper work for [employer] has been faxed

## 2021-12-24 ENCOUNTER — Telehealth: Payer: Self-pay | Admitting: Rheumatology

## 2021-12-24 ENCOUNTER — Other Ambulatory Visit: Payer: Self-pay | Admitting: Internal Medicine

## 2021-12-24 NOTE — Telephone Encounter (Signed)
Patient called stating she tried to order her Enbrel and found out she is out of refills and was told a new prescription needs to be sent.  Patient states she takes her last injection on Monday, 12/28/21.  Patient requested a return call when the prescription is sent so she can set up shipment date.

## 2021-12-24 NOTE — Telephone Encounter (Signed)
Spoke with patient and advised patient she will need to update lab work prior to sending in prescription. Patient has an appointment on 12/30/2021 and advised we can update labs at that appointment and then send prescription.

## 2021-12-27 ENCOUNTER — Other Ambulatory Visit: Payer: Self-pay | Admitting: Internal Medicine

## 2021-12-30 ENCOUNTER — Ambulatory Visit: Payer: Medicare Other | Admitting: Rheumatology

## 2021-12-30 ENCOUNTER — Encounter: Payer: Self-pay | Admitting: Rheumatology

## 2021-12-30 VITALS — BP 148/87 | HR 78 | Ht 62.0 in | Wt 155.0 lb

## 2021-12-30 DIAGNOSIS — Z79899 Other long term (current) drug therapy: Secondary | ICD-10-CM | POA: Diagnosis not present

## 2021-12-30 DIAGNOSIS — M1711 Unilateral primary osteoarthritis, right knee: Secondary | ICD-10-CM

## 2021-12-30 DIAGNOSIS — M19072 Primary osteoarthritis, left ankle and foot: Secondary | ICD-10-CM

## 2021-12-30 DIAGNOSIS — M0609 Rheumatoid arthritis without rheumatoid factor, multiple sites: Secondary | ICD-10-CM | POA: Diagnosis not present

## 2021-12-30 DIAGNOSIS — M8589 Other specified disorders of bone density and structure, multiple sites: Secondary | ICD-10-CM

## 2021-12-30 DIAGNOSIS — M19042 Primary osteoarthritis, left hand: Secondary | ICD-10-CM

## 2021-12-30 DIAGNOSIS — R252 Cramp and spasm: Secondary | ICD-10-CM

## 2021-12-30 DIAGNOSIS — Z87898 Personal history of other specified conditions: Secondary | ICD-10-CM

## 2021-12-30 DIAGNOSIS — Z8669 Personal history of other diseases of the nervous system and sense organs: Secondary | ICD-10-CM

## 2021-12-30 DIAGNOSIS — M7061 Trochanteric bursitis, right hip: Secondary | ICD-10-CM

## 2021-12-30 DIAGNOSIS — E118 Type 2 diabetes mellitus with unspecified complications: Secondary | ICD-10-CM | POA: Diagnosis not present

## 2021-12-30 DIAGNOSIS — M19041 Primary osteoarthritis, right hand: Secondary | ICD-10-CM | POA: Diagnosis not present

## 2021-12-30 DIAGNOSIS — Z8659 Personal history of other mental and behavioral disorders: Secondary | ICD-10-CM

## 2021-12-30 DIAGNOSIS — M5136 Other intervertebral disc degeneration, lumbar region: Secondary | ICD-10-CM

## 2021-12-30 DIAGNOSIS — Z8639 Personal history of other endocrine, nutritional and metabolic disease: Secondary | ICD-10-CM

## 2021-12-30 DIAGNOSIS — I10 Essential (primary) hypertension: Secondary | ICD-10-CM

## 2021-12-30 DIAGNOSIS — K219 Gastro-esophageal reflux disease without esophagitis: Secondary | ICD-10-CM

## 2021-12-30 DIAGNOSIS — Z87891 Personal history of nicotine dependence: Secondary | ICD-10-CM

## 2021-12-30 DIAGNOSIS — Z794 Long term (current) use of insulin: Secondary | ICD-10-CM

## 2021-12-30 DIAGNOSIS — M19071 Primary osteoarthritis, right ankle and foot: Secondary | ICD-10-CM

## 2021-12-30 LAB — CBC WITH DIFFERENTIAL/PLATELET
Absolute Monocytes: 634 cells/uL (ref 200–950)
Basophils Absolute: 86 cells/uL (ref 0–200)
Basophils Relative: 0.9 %
Eosinophils Absolute: 269 cells/uL (ref 15–500)
Eosinophils Relative: 2.8 %
HCT: 38.9 % (ref 35.0–45.0)
Hemoglobin: 13.3 g/dL (ref 11.7–15.5)
Lymphs Abs: 2314 cells/uL (ref 850–3900)
MCH: 32 pg (ref 27.0–33.0)
MCHC: 34.2 g/dL (ref 32.0–36.0)
MCV: 93.5 fL (ref 80.0–100.0)
MPV: 10.8 fL (ref 7.5–12.5)
Monocytes Relative: 6.6 %
Neutro Abs: 6298 cells/uL (ref 1500–7800)
Neutrophils Relative %: 65.6 %
Platelets: 336 10*3/uL (ref 140–400)
RBC: 4.16 10*6/uL (ref 3.80–5.10)
RDW: 12 % (ref 11.0–15.0)
Total Lymphocyte: 24.1 %
WBC: 9.6 10*3/uL (ref 3.8–10.8)

## 2021-12-30 LAB — COMPLETE METABOLIC PANEL WITH GFR
AG Ratio: 1.7 (calc) (ref 1.0–2.5)
ALT: 11 U/L (ref 6–29)
AST: 12 U/L (ref 10–35)
Albumin: 4.2 g/dL (ref 3.6–5.1)
Alkaline phosphatase (APISO): 86 U/L (ref 37–153)
BUN/Creatinine Ratio: 16 (calc) (ref 6–22)
BUN: 18 mg/dL (ref 7–25)
CO2: 30 mmol/L (ref 20–32)
Calcium: 9.5 mg/dL (ref 8.6–10.4)
Chloride: 102 mmol/L (ref 98–110)
Creat: 1.1 mg/dL — ABNORMAL HIGH (ref 0.60–1.00)
Globulin: 2.5 g/dL (calc) (ref 1.9–3.7)
Glucose, Bld: 93 mg/dL (ref 65–99)
Potassium: 4.4 mmol/L (ref 3.5–5.3)
Sodium: 140 mmol/L (ref 135–146)
Total Bilirubin: 1.4 mg/dL — ABNORMAL HIGH (ref 0.2–1.2)
Total Protein: 6.7 g/dL (ref 6.1–8.1)
eGFR: 51 mL/min/{1.73_m2} — ABNORMAL LOW (ref >=60)

## 2021-12-30 MED ORDER — ENBREL SURECLICK 50 MG/ML ~~LOC~~ SOAJ: 50.0000 mg | SUBCUTANEOUS | 0 refills | Status: DC

## 2021-12-30 NOTE — Patient Instructions (Addendum)
Standing Labs We placed an order today for your standing lab work.   Please have your standing labs drawn in September and every 3 months  If possible, please have your labs drawn 2 weeks prior to your appointment so that the provider can discuss your results at your appointment.  Please note that you may see your imaging and lab results in [facility] before we have reviewed them. We may be awaiting multiple results to interpret others before contacting you. Please allow our office up to 72 hours to thoroughly review all of the results before contacting the office for clarification of your results.  We have open lab daily: Monday through Thursday from 1:30-4:30 PM and Friday from 1:30-4:00 PM at the office of Dr. Bo Merino, [facility].   Please be advised, all patients with office appointments requiring lab work will take precedent over walk-in lab work.  If possible, please come for your lab work on Monday and Friday afternoons, as you may experience shorter wait times. The office is located at [address], [place], [place], [place] [address] No appointment is necessary.   Labs are drawn by [facility]. Please bring your co-pay at the time of your lab draw.  You may receive a bill from [facility] for your lab work.  Please note if you are on Hydroxychloroquine and and an order has been placed for a Hydroxychloroquine level, you will need to have it drawn 4 hours or more after your last dose.  If you wish to have your labs drawn at another location, please call the office 24 hours in advance to send orders.  If you have any questions regarding directions or hours of operation,  please call [phone].   As a reminder, please drink plenty of water prior to coming for your lab work. Thanks!   Vaccines You are taking a medication(s) that can suppress your immune system.  The following immunizations are recommended: Flu annually Covid-19  Td/Tdap (tetanus, diphtheria,  pertussis) every 10 years Pneumonia (Prevnar 15 then Pneumovax 23 at least 1 year apart.  Alternatively, can take Prevnar 20 without needing additional dose) Shingrix: 2 doses from 4 weeks to 6 months apart  Please check with your PCP to make sure you are up to date.   If you have signs or symptoms of an infection or start antibiotics: First, call your PCP for workup of your infection. Hold your medication through the infection, until you complete your antibiotics, and until symptoms resolve if you take the following: Injectable medication (Actemra, Benlysta, Cimzia, Cosentyx, Enbrel, Humira, Kevzara, Orencia, Remicade, Simponi, Stelara, Taltz, Tremfya) Methotrexate Leflunomide (Arava) Mycophenolate (Cellcept) Morrie Sheldon, Olumiant, or Rinvoq  Back Exercises The following exercises strengthen the muscles that help to support the trunk (torso) and back. They also help to keep the lower back flexible. Doing these exercises can help to prevent or lessen existing low back pain. If you have back pain or discomfort, try doing these exercises 2-3 times each day or as told by your health care provider. As your pain improves, do them once each day, but increase the number of times that you repeat the steps for each exercise (do more repetitions). To prevent the recurrence of back pain, continue to do these exercises once each day or as told by your health care provider. Do exercises exactly as told by your health care provider and adjust them as directed. It is normal to feel mild stretching, pulling, tightness, or discomfort as you do these exercises, but  you should stop right away if you feel sudden pain or your pain gets worse. Exercises Single knee to chest Repeat these steps 3-5 times for each leg: Lie on your back on a firm bed or the floor with your legs extended. Bring one knee to your chest. Your other leg should stay extended and in contact with the floor. Hold your knee in place by grabbing  your knee or thigh with both hands and hold. Pull on your knee until you feel a gentle stretch in your lower back or buttocks. Hold the stretch for 10-30 seconds. Slowly release and straighten your leg.  Pelvic tilt Repeat these steps 5-10 times: Lie on your back on a firm bed or the floor with your legs extended. Bend your knees so they are pointing toward the ceiling and your feet are flat on the floor. Tighten your lower abdominal muscles to press your lower back against the floor. This motion will tilt your pelvis so your tailbone points up toward the ceiling instead of pointing to your feet or the floor. With gentle tension and even breathing, hold this position for 5-10 seconds.  Cat-cow Repeat these steps until your lower back becomes more flexible: Get into a hands-and-knees position on a firm bed or the floor. Keep your hands under your shoulders, and keep your knees under your hips. You may place padding under your knees for comfort. Let your head hang down toward your chest. Contract your abdominal muscles and point your tailbone toward the floor so your lower back becomes rounded like the back of a cat. Hold this position for 5 seconds. Slowly lift your head, let your abdominal muscles relax, and point your tailbone up toward the ceiling so your back forms a sagging arch like the back of a cow. Hold this position for 5 seconds.  Press-ups Repeat these steps 5-10 times: Lie on your abdomen (face-down) on a firm bed or the floor. Place your palms near your head, about shoulder-width apart. Keeping your back as relaxed as possible and keeping your hips on the floor, slowly straighten your arms to raise the top half of your body and lift your shoulders. Do not use your back muscles to raise your upper torso. You may adjust the placement of your hands to make yourself more comfortable. Hold this position for 5 seconds while you keep your back relaxed. Slowly return to lying flat on  the floor.  Bridges Repeat these steps 10 times: Lie on your back on a firm bed or the floor. Bend your knees so they are pointing toward the ceiling and your feet are flat on the floor. Your arms should be flat at your sides, next to your body. Tighten your buttocks muscles and lift your buttocks off the floor until your waist is at almost the same height as your knees. You should feel the muscles working in your buttocks and the back of your thighs. If you do not feel these muscles, slide your feet 1-2 inches (2.5-5 cm) farther away from your buttocks. Hold this position for 3-5 seconds. Slowly lower your hips to the starting position, and allow your buttocks muscles to relax completely. If this exercise is too easy, try doing it with your arms crossed over your chest. Abdominal crunches Repeat these steps 5-10 times: Lie on your back on a firm bed or the floor with your legs extended. Bend your knees so they are pointing toward the ceiling and your feet are flat on the floor.  Cross your arms over your chest. Tip your chin slightly toward your chest without bending your neck. Tighten your abdominal muscles and slowly raise your torso high enough to lift your shoulder blades a tiny bit off the floor. Avoid raising your torso higher than that because it can put too much stress on your lower back and does not help to strengthen your abdominal muscles. Slowly return to your starting position.  Back lifts Repeat these steps 5-10 times: Lie on your abdomen (face-down) with your arms at your sides, and rest your forehead on the floor. Tighten the muscles in your legs and your buttocks. Slowly lift your chest off the floor while you keep your hips pressed to the floor. Keep the back of your head in line with the curve in your back. Your eyes should be looking at the floor. Hold this position for 3-5 seconds. Slowly return to your starting position.  Contact a health care provider if: Your back  pain or discomfort gets much worse when you do an exercise. Your worsening back pain or discomfort does not lessen within 2 hours after you exercise. If you have any of these problems, stop doing these exercises right away. Do not do them again unless your health care provider says that you can. Get help right away if: You develop sudden, severe back pain. If this happens, stop doing the exercises right away. Do not do them again unless your health care provider says that you can. This information is not intended to replace advice given to you by your health care provider. Make sure you discuss any questions you have with your health care provider. Document Revised: 12/16/2020 Document Reviewed: 09/03/2020 Elsevier Patient Education  [place].

## 2021-12-31 NOTE — Progress Notes (Signed)
CBC is normal.  CMP shows improvement in creatinine.

## 2022-01-14 ENCOUNTER — Encounter: Payer: Self-pay | Admitting: Orthopedic Surgery

## 2022-01-14 ENCOUNTER — Ambulatory Visit (INDEPENDENT_AMBULATORY_CARE_PROVIDER_SITE_OTHER): Payer: Medicare Other | Admitting: Orthopedic Surgery

## 2022-01-14 VITALS — BP 122/60 | HR 78 | Temp 97.8°F | Resp 18 | Ht 62.0 in | Wt 158.0 lb

## 2022-01-14 DIAGNOSIS — E1169 Type 2 diabetes mellitus with other specified complication: Secondary | ICD-10-CM | POA: Diagnosis not present

## 2022-01-14 DIAGNOSIS — Z794 Long term (current) use of insulin: Secondary | ICD-10-CM

## 2022-01-14 DIAGNOSIS — I152 Hypertension secondary to endocrine disorders: Secondary | ICD-10-CM | POA: Diagnosis not present

## 2022-01-14 DIAGNOSIS — M0609 Rheumatoid arthritis without rheumatoid factor, multiple sites: Secondary | ICD-10-CM | POA: Diagnosis not present

## 2022-01-14 DIAGNOSIS — E785 Hyperlipidemia, unspecified: Secondary | ICD-10-CM | POA: Diagnosis not present

## 2022-01-14 DIAGNOSIS — E1159 Type 2 diabetes mellitus with other circulatory complications: Secondary | ICD-10-CM

## 2022-01-14 DIAGNOSIS — E118 Type 2 diabetes mellitus with unspecified complications: Secondary | ICD-10-CM

## 2022-01-14 DIAGNOSIS — I700 Atherosclerosis of aorta: Secondary | ICD-10-CM | POA: Diagnosis not present

## 2022-01-14 MED ORDER — FLUOCINONIDE 0.05 % EX SOLN: 1.0000 | Freq: Every day | CUTANEOUS | 3 refills | Status: DC | PRN

## 2022-01-14 NOTE — Progress Notes (Signed)
Careteam: Patient Care Team: [name], NP as PCP - General (Adult Health Nurse Practitioner) Werner Lean, MD as PCP - Cardiology (Cardiology)  Seen by: Windell Moulding, AGNP-C  PLACE OF SERVICE:  [place] Directive information Does Patient Have a Medical Advance Directive?: No, Would patient like information on creating a medical advance directive?: No - Patient declined  Allergies  Allergen Reactions   Amaryl [Glimepiride]     Don't Remember   Amlodipine     LE edema on 7.'5mg'$  daily. Tolerates '5mg'$  daily just fine   Codeine     SICK   Jardiance [Empagliflozin]     Urinary tract infection- SIDE EFFECT   Magnesium Citrate     Strong urine odor and dysuria   Pneumovax 23 [Pneumococcal Vac Polyvalent]     Red / Swelling / Hot to touch SIDE EFFECTS   Wellbutrin [Bupropion]     Unknown   Latex Rash    Chief Complaint  Patient presents with   Medical Management of Chronic Issues    Patient is here for a 42M follow up for chronic conditions, patient due for eye exam      HPI: Patient is a 81 y.o. female seen today for medical management of chronic conditions.   No health concerns today. Recent lab work reviewed with patient.   She continues to see many specialists to manage diabetes, RA and aortic atherosclerosis.   Off Trulicity and Humulin, now on Ozempic (now 3 weeks). Taking Tresiba 55 units daily. No hypoglycemia. She is now checking her sugars with Freestyle Libre. She noticed some hair loss about 2-3 weeks ago. Plans to ask next appointment. She has not scheduled yearly eye exam. Continues to follow carb modified diet.   Discussed need for bone density study. She will schedule with next mammogram.   No recent falls or injuries.     Review of Systems:  Review of Systems  Constitutional: Negative.   HENT: Negative.    Eyes: Negative.   Respiratory:  Negative for cough, shortness of breath and wheezing.   Cardiovascular:  Negative for  chest pain and leg swelling.  Gastrointestinal:  Negative for abdominal pain, blood in stool, constipation, diarrhea, heartburn, nausea and vomiting.  Genitourinary:  Negative for dysuria and frequency.  Musculoskeletal:  Positive for back pain and joint pain. Negative for falls.  Skin: Negative.   Neurological:  Negative for dizziness, weakness and headaches.  Psychiatric/Behavioral:  Negative for depression. The patient is not nervous/anxious and does not have insomnia.     Past Medical History:  Diagnosis Date   Acute cystitis    UTI   Anxiety    Arthritis    Benign positional vertigo    Dermatitis    Diabetes mellitus without complication ([place])    Family history of adverse reaction to anesthesia    GERD (gastroesophageal reflux disease)    Goiter, nontoxic, multinodular    Gout    H/O: hysterectomy    History of tobacco use    Hx laparoscopic cholecystectomy    Hypercholesterolemia    Hypertension    Incomplete bladder emptying    Insomnia    Postmenopausal atrophic vaginitis    RA (rheumatoid arthritis) ([place])    Rectocele    Renal calculus    Sleep apnea    Thyroid condition    Type 2 diabetes mellitus ([place])    Urolithiasis    Vaginal enterocele    Past Surgical History:  Procedure Laterality  Date   CARPAL TUNNEL RELEASE  2018   CATARACT EXTRACTION, BILATERAL     CHOLECYSTECTOMY     COLONOSCOPY     07/11/2009, 02/2015   GALLBLADDER SURGERY     KNEE ARTHROSCOPY Right 05/20/2020   Procedure: RIGHT KNEE ARTHROSCOPY AND DEBRIDEMENT;  Surgeon: [name], MD;  Location: [facility];  Service: Orthopedics;  Laterality: Right;   PARTIAL HYSTERECTOMY     Overies left intact   SQUAMOUS CELL CARCINOMA EXCISION  05/2018   Social History:   reports that she quit smoking about 43 years ago. Her smoking use included cigarettes. She has a 2.00 pack-year smoking history. She has never used smokeless tobacco. She reports that she does not drink alcohol and  does not use drugs.  Family History  Problem Relation Age of Onset   Diabetes Mother    Heart disease Father 18   Diabetes Son    Crohn's disease Son     Medications: Patient's Medications  New Prescriptions   No medications on file  Previous Medications   ACCU-CHEK SOFTCLIX LANCETS LANCETS    Use to test blood sugar three times daily. Dx: E11.8   ASPIRIN EC 81 MG TABLET    Take 81 mg by mouth daily.   BIOTIN 1 MG CAPS    Take by mouth.   CARVEDILOL (COREG) 6.25 MG TABLET    Take 1 tablet (6.25 mg total) by mouth 2 (two) times daily.   CHLORTHALIDONE (HYGROTON) 25 MG TABLET    Take 1 tablet (25 mg total) by mouth daily. Please keep July appointment for future refills. Thank you.   CONTINUOUS BLOOD GLUC RECEIVER (FREESTYLE LIBRE 2 READER) DEVI       CONTINUOUS BLOOD GLUC SENSOR (FREESTYLE LIBRE 2 SENSOR) MISC    USE TO CHECK BLOOD SUGAR **CHANGE EVERY 14 DAYS**   CRANBERRY PO    Take by mouth daily.   DICLOFENAC SODIUM (VOLTAREN EX)    Apply 1 application topically 3 (three) times daily as needed.   ESTRADIOL (ESTRACE) 0.1 MG/GM VAGINAL CREAM    Place 1 Applicatorful vaginally 3 (three) times a week.   ETANERCEPT (ENBREL SURECLICK) 50 MG/ML INJECTION    Inject 50 mg into the skin once a week.   FLUOCINONIDE (LIDEX) 0.05 % EXTERNAL SOLUTION    Apply 1 application topically daily as needed.   GLUCOSE BLOOD (ACCU-CHEK GUIDE) TEST STRIP    USE TO CHECK BLOOD SUGAR THREE TIMES A DAY   INSULIN DEGLUDEC (TRESIBA FLEXTOUCH) 200 UNIT/ML FLEXTOUCH PEN    Inject 56 Units into the skin daily in the afternoon.   INSULIN PEN NEEDLE 31G X 5 MM MISC    1 Device by Does not apply route daily in the afternoon.   IRBESARTAN (AVAPRO) 300 MG TABLET    TAKE ONE TABLET BY MOUTH DAILY   LOPERAMIDE (IMODIUM A-D) 2 MG TABLET    Take 2 mg by mouth as needed for diarrhea or loose stools.   LORAZEPAM (ATIVAN) 0.5 MG TABLET    TAKE ONE TABLET BY MOUTH EVERY NIGHT AT BEDTIME   MELATONIN PO    Take 10 mg by mouth at  bedtime as needed.   NITROGLYCERIN (NITROSTAT) 0.4 MG SL TABLET    Place 1 tablet (0.4 mg total) under the tongue every 5 (five) minutes as needed for chest pain.   OMEPRAZOLE 20 MG TBDD    Take 20 mg by mouth daily.   ONDANSETRON (ZOFRAN) 4 MG TABLET  Take 4 mg by mouth 3 (three) times daily as needed for nausea or vomiting.   PROBIOTIC PRODUCT (PROBIOTIC PO)    Take by mouth daily.   SEMAGLUTIDE,0.25 OR 0.'5MG'$ /DOS, (OZEMPIC, 0.25 OR 0.5 MG/DOSE,) 2 MG/1.5ML SOPN    Inject 0.5 mg into the skin once a week.   SIMVASTATIN (ZOCOR) 20 MG TABLET    TAKE ONE TABLET BY MOUTH EVERY EVENING  Modified Medications   No medications on file  Discontinued Medications   No medications on file    Physical Exam:  There were no vitals filed for this visit. There is no height or weight on file to calculate BMI. Wt Readings from Last 3 Encounters:  12/30/21 155 lb (70.3 kg)  11/10/21 156 lb 12.8 oz (71.1 kg)  10/05/21 160 lb (72.6 kg)    Physical Exam Vitals reviewed.  Constitutional:      General: She is not in acute distress. HENT:     Head: Normocephalic.  Eyes:     General:        Right eye: No discharge.        Left eye: No discharge.  Cardiovascular:     Rate and Rhythm: Normal rate and regular rhythm.     Pulses: Normal pulses.     Heart sounds: Normal heart sounds.  Pulmonary:     Effort: Pulmonary effort is normal. No respiratory distress.     Breath sounds: Normal breath sounds. No wheezing.  Abdominal:     General: Bowel sounds are normal. There is no distension.     Palpations: Abdomen is soft.     Tenderness: There is no abdominal tenderness.  Musculoskeletal:     Cervical back: Neck supple.     Right lower leg: No edema.     Left lower leg: No edema.  Lymphadenopathy:     Cervical: No cervical adenopathy.  Skin:    General: Skin is warm and dry.     Capillary Refill: Capillary refill takes less than 2 seconds.  Neurological:     General: No focal deficit present.      Mental Status: She is alert and oriented to person, place, and time.  Psychiatric:        Mood and Affect: Mood normal.        Behavior: Behavior normal.     Labs reviewed: Basic Metabolic Panel: Recent Labs    02/11/21 1418 04/09/21 1138 04/29/21 1049 08/13/21 1334 09/07/21 1232 12/30/21 1109  NA 133*  --    < > 138 141 140  K 4.4  --    < > 4.8 4.0 4.4  CL 99  --    < > 100 100 102  CO2 24  --    < > '29 27 30  '$ GLUCOSE 291*  --    < > 234* 147* 93  BUN 28*  --    < > 28* 20 18  CREATININE 1.17*  --    < > 1.49* 1.15* 1.10*  CALCIUM 9.4  --    < > 9.2 9.3 9.5  MG 1.6  --   --   --   --   --   TSH  --  1.81  --   --   --   --    < > = values in this interval not displayed.   Liver Function Tests: Recent Labs    04/29/21 1049 07/29/21 1056 12/30/21 1109  AST '15 10 12  '$ ALT 12 10  11  BILITOT 1.1 1.0 1.4*  PROT 6.9 7.0 6.7   No results for input(s): "LIPASE", "AMYLASE" in the last 8760 hours. No results for input(s): "AMMONIA" in the last 8760 hours. CBC: Recent Labs    04/09/21 1138 07/29/21 1056 12/30/21 1109  WBC 9.5 9.6 9.6  NEUTROABS 6,356 6,499 6,298  HGB 11.8 12.7 13.3  HCT 36.2 38.4 38.9  MCV 97.8 95.3 93.5  PLT 335 332 336   Lipid Panel: Recent Labs    08/06/21 0929  CHOL 112  HDL 39*  LDLCALC 50  TRIG 146  CHOLHDL 2.9   TSH: Recent Labs    04/09/21 1138  TSH 1.81   A1C: Lab Results  Component Value Date   HGBA1C 7.6 (A) 11/10/2021     Assessment/Plan 1. Type 2 diabetes mellitus with complication, with long-term current use of insulin (HCC) - A1c 7.6 11/10/2021 - followed by endocrinology - now on Ozempic and Tresiba - cont glimepiride - no hypoglycemia - needs yearly diabetic eye exam - cont low carb diet  2. Hypertension associated with diabetes ([place]) - controlled - cont irbesartan, chlorthalidone and coreg  3. Hyperlipidemia associated with type 2 diabetes mellitus (HCC) - LDL 50 08/06/2021, at goal < 70 - cont  simvastatin  4. Rheumatoid arthritis of multiple sites with negative rheumatoid factor ([place]) - followed by rheumatology - cont Enbrel - no sign of infection/ WBC normal  5. Aortic atherosclerosis ([place]) - followed by cardiology - cont asa and statin  Total time: 31 minutes. Greater than 50% of total time spent doing patient education regarding health maintenance, diabetes, hypertension, and cholesterol.     Next appt: Visit date not found  [place], [facility] [phone]

## 2022-01-14 NOTE — Patient Instructions (Addendum)
Schedule yearly diabetic eye exam  Schedule bone density test when you can

## 2022-01-25 ENCOUNTER — Telehealth: Payer: Self-pay

## 2022-01-25 NOTE — Progress Notes (Unsigned)
Cardiology Office Note:    Date:  01/27/2022   ID:  YEILYN GENT, DOB 05-02-1941, MRN [id number]  PCP:  [name], NP  [facility] Cardiologist:  Rudean Haskell MD [place] Electrophysiologist:  None   CC: Follow up moderate CAD, HTN  History of Present Illness:    Annette Suarez is a 81 y.o. female with a hx of Diabetes and HTN, HLD, OSA not on CPAP who presented for evaluation 06/11/20.   2022:  Had subclavian stenosis and aortic atherosclerosis, see by Dr. Fletcher Anon for PAD.  Stopped norvasx for leg swelling. 2023: DOD for CP, CCTA FFR negative.  Blood pressure continued to improve  Patient notes that she is doing well.   Canning tomatoes and gardening. Husband does most of the heavy lifting because of concerns she will fall. There are no interval hospital/ED visit.    No chest pain or pressure.  No SOB/DOE and no PND/Orthopnea.   Has been coughing and increased congestion this summer. Notes weight less of ozempic (only new medication) or leg swelling.  No palpitations or syncope.   Past Medical History:  Diagnosis Date   Acute cystitis    UTI   Anxiety    Arthritis    Benign positional vertigo    Dermatitis    Diabetes mellitus without complication ([place])    Family history of adverse reaction to anesthesia    GERD (gastroesophageal reflux disease)    Goiter, nontoxic, multinodular    Gout    H/O: hysterectomy    History of tobacco use    Hx laparoscopic cholecystectomy    Hypercholesterolemia    Hypertension    Incomplete bladder emptying    Insomnia    Postmenopausal atrophic vaginitis    RA (rheumatoid arthritis) ([place])    Rectocele    Renal calculus    Sleep apnea    Thyroid condition    Type 2 diabetes mellitus ([place])    Urolithiasis    Vaginal enterocele     Past Surgical History:  Procedure Laterality Date   CARPAL TUNNEL RELEASE  2018   CATARACT EXTRACTION, BILATERAL     CHOLECYSTECTOMY     COLONOSCOPY     07/11/2009, 02/2015    GALLBLADDER SURGERY     KNEE ARTHROSCOPY Right 05/20/2020   Procedure: RIGHT KNEE ARTHROSCOPY AND DEBRIDEMENT;  Surgeon: [name], MD;  Location: [facility];  Service: Orthopedics;  Laterality: Right;   PARTIAL HYSTERECTOMY     Overies left intact   SQUAMOUS CELL CARCINOMA EXCISION  05/2018    Current Medications: Current Meds  Medication Sig   Accu-Chek Softclix Lancets lancets Use to test blood sugar three times daily. Dx: E11.8   aspirin EC 81 MG tablet Take 81 mg by mouth daily.   Biotin 1 MG CAPS Take by mouth.   carvedilol (COREG) 6.25 MG tablet Take 1 tablet (6.25 mg total) by mouth 2 (two) times daily.   chlorthalidone (HYGROTON) 25 MG tablet Take 1 tablet (25 mg total) by mouth daily. Please keep July appointment for future refills. Thank you.   Continuous Blood Gluc Receiver (FREESTYLE LIBRE 2 READER) DEVI    Continuous Blood Gluc Sensor (FREESTYLE LIBRE 2 SENSOR) MISC USE TO CHECK BLOOD SUGAR **CHANGE EVERY 14 DAYS**   CRANBERRY PO Take by mouth daily.   Diclofenac Sodium (VOLTAREN EX) Apply 1 application topically 3 (three) times daily as needed.   estradiol (ESTRACE) 0.1 MG/GM vaginal cream Place 1 Applicatorful vaginally 3 (  three) times a week.   etanercept (ENBREL SURECLICK) 50 MG/ML injection Inject 50 mg into the skin once a week.   fluocinonide (LIDEX) 0.05 % external solution Apply 1 Application topically daily as needed.   glucose blood (ACCU-CHEK GUIDE) test strip USE TO CHECK BLOOD SUGAR THREE TIMES A DAY   insulin degludec (TRESIBA FLEXTOUCH) 200 UNIT/ML FlexTouch Pen Inject 56 Units into the skin daily in the afternoon.   Insulin Pen Needle 31G X 5 MM MISC 1 Device by Does not apply route daily in the afternoon.   irbesartan (AVAPRO) 300 MG tablet TAKE ONE TABLET BY MOUTH DAILY   loperamide (IMODIUM A-D) 2 MG tablet Take 2 mg by mouth as needed for diarrhea or loose stools.   LORazepam (ATIVAN) 0.5 MG tablet TAKE ONE TABLET BY MOUTH EVERY NIGHT  AT BEDTIME   MELATONIN PO Take 10 mg by mouth at bedtime as needed.   nitroGLYCERIN (NITROSTAT) 0.4 MG SL tablet Place 1 tablet (0.4 mg total) under the tongue every 5 (five) minutes as needed for chest pain.   Omeprazole 20 MG TBDD Take 20 mg by mouth daily.   ondansetron (ZOFRAN) 4 MG tablet Take 4 mg by mouth 3 (three) times daily as needed for nausea or vomiting.   Probiotic Product (PROBIOTIC PO) Take by mouth daily.   Semaglutide,0.25 or 0.'5MG'$ /DOS, (OZEMPIC, 0.25 OR 0.5 MG/DOSE,) 2 MG/1.5ML SOPN Inject 0.5 mg into the skin once a week.   simvastatin (ZOCOR) 20 MG tablet TAKE ONE TABLET BY MOUTH EVERY EVENING   [DISCONTINUED] glimepiride (AMARYL) 2 MG tablet Take 2 mg by mouth once a week.     Allergies:   Amaryl [glimepiride], Amlodipine, Codeine, Jardiance [empagliflozin], Magnesium citrate, Pneumovax 23 [pneumococcal vac polyvalent], Wellbutrin [bupropion], and Latex   Social History   Socioeconomic History   Marital status: Married    Spouse name: Not on file   Number of children: Not on file   Years of education: Not on file   Highest education level: Not on file  Occupational History   Not on file  Tobacco Use   Smoking status: Former    Packs/day: 0.20    Years: 10.00    Total pack years: 2.00    Types: Cigarettes    Quit date: 07/05/1978    Years since quitting: 43.5   Smokeless tobacco: Never  Vaping Use   Vaping Use: Never used  Substance and Sexual Activity   Alcohol use: No   Drug use: No   Sexual activity: Not Currently  Other Topics Concern   Not on file  Social History Narrative   Diet: Blank      Do you drink/ eat things with caffeine? Yes      Marital status:  Married                             What year were you married ? 196      Do you live in a house, apartment,assistred living, condo, trailer, etc.)? House      Is it one or more stories? 1      How many persons live in your home ? 2      Do you have any pets in your home ?(please list)  1  Sam-Dog      Highest Level of education completed: 12      Current or past profession: Office      Do you exercise?  Some                           Type & how often  Walk Daily      ADVANCED DIRECTIVES (Please bring copies)      Do you have a living will? Blank      Do you have a DNR form?  Blank                     If not, do you want to discuss one?       Do you have signed POA?HPOA forms?  Yes               If so, please bring to your appointment      FUNCTIONAL STATUS- To be completed by Spouse / child / Staff       Do you have difficulty bathing or dressing yourself ?  No      Do you have difficulty preparing food or eating ?  No      Do you have difficulty managing your mediation ?  No      Do you have difficulty managing your finances ?  No      Do you have difficulty affording your medication ?  Yes (Some)      Social Determinants of Health   Financial Resource Strain: Not on file  Food Insecurity: Not on file  Transportation Needs: Not on file  Physical Activity: Not on file  Stress: Not on file  Social Connections: Not on file    Family History: The patient's family history includes Crohn's disease in her son; Diabetes in her mother and son; Heart disease (age of onset: 35) in her father. Denies family history of sudden cardiac death including drowning, car accidents, or unexplained deaths in the family. No history of aortic dissection, subclavian stenosis, or coarctation of the aorta in the family  ROS:   Please see the history of present illness.    All other systems reviewed and are negative.  EKGs/Labs/Other Studies Reviewed:    The following studies were reviewed today:  EKG:   06/10/21 SR rate 92  05/16/20 SR rate 84 with non-specific T wave flattening  Transthoracic Echocardiogram Date: 07/08/20 Results  1. Left ventricular ejection fraction, by estimation, is >75%. The left  ventricle has hyperdynamic function. The left ventricle has no  regional  wall motion abnormalities. Left ventricular diastolic parameters are  consistent with Grade I diastolic  dysfunction (impaired relaxation). Elevated left atrial pressure.   2. Right ventricular systolic function is normal. The right ventricular  size is normal. There is mildly elevated pulmonary artery systolic  pressure.   3. The mitral valve is normal in structure. Mild mitral valve  regurgitation. No evidence of mitral stenosis.   4. The aortic valve is tricuspid. Aortic valve regurgitation is trivial.  Mild aortic valve sclerosis is present, with no evidence of aortic valve  stenosis.   5. The inferior vena cava is normal in size with greater than 50%  respiratory variability, suggesting right atrial pressure of 3 mmHg.  Carotid Artery Duplex: Date: 07/16/20 Results: Summary:  Right Carotid: Velocities in the right ICA are consistent with a 1-39%  stenosis.   Left Carotid: Velocities in the left ICA are consistent with a 40-59%  stenosis.   Vertebrals:  Right vertebral artery demonstrates antegrade flow. Left  vertebral  artery demonstrates retrograde flow, consistent with  subclavian               steal syndrome.  Subclavians: Left subclavian artery flow was disturbed, with dampened  monophasic               flow consistent with a more proximal stenosis or occlusion.  Normal               flow hemodynamics were seen in the right subclavian artery.               There is a 20 mmHg difference brachial pressure gradient,  with the               left arm being the lowest. The right brachial artery is  patent with               triphasic flow, the left brachial artery is patent with               bi/monophasic flow.   NonCardiac CT: Date: 07/08/20 Results: IMPRESSION: Vascular:  1. Severe stenosis of the proximal left subclavian artery secondary to calcified atherosclerotic plaque. 2. Widely patent and normal appearing right brachiocephalic  and right subclavian arteries. No evidence of aortic coarctation, as queried. 3.  Aortic Atherosclerosis   Recent Labs: 02/11/2021: Magnesium 1.6 04/09/2021: TSH 1.81 12/30/2021: ALT 11; BUN 18; Creat 1.10; Hemoglobin 13.3; Platelets 336; Potassium 4.4; Sodium 140  Recent Lipid Panel    Component Value Date/Time   CHOL 112 08/06/2021 0929   TRIG 146 08/06/2021 0929   HDL 39 (L) 08/06/2021 0929   CHOLHDL 2.9 08/06/2021 0929   LDLCALC 50 08/06/2021 0929    Physical Exam:    VS:  BP 139/74   Pulse 80   Ht '5\' 2"'$  (1.575 m)   Wt 158 lb (71.7 kg)   SpO2 98%   BMI 28.90 kg/m     Right arm BP used  Wt Readings from Last 3 Encounters:  01/27/22 158 lb (71.7 kg)  01/14/22 158 lb (71.7 kg)  12/30/21 155 lb (70.3 kg)    Gen: No distress  Neck: No JVD, left subclarivan bruit Cardiac: No Rubs or Gallops, no murmur, RRR +2 radial pulses Respiratory: Clear to auscultation bilaterally, normal effort, normal  respiratory rate GI: Soft, nontender, non-distended  MS: No  edema;  moves all extremities Integument: Skin feels warm Neuro:  At time of evaluation, alert and oriented to person/place/time/situation  Psych: Normal affect, patient feels well  ASSESSMENT:    1. Aortic atherosclerosis ([place])   2. Coronary artery disease of native artery of native heart with stable angina pectoris ([place])   3. Diabetes mellitus with coincident hypertension ([place])   4. Hyperlipidemia associated with type 2 diabetes mellitus ([place])   5. Subclavian arterial stenosis (HCC)     PLAN:    Coronary Artery Disease; Non obstructive FFR negative Aortic Atherosclerosis Hyperlipidemia Hypertension with Diabetes (CTZ stopped for hypokalemia) Distant smoker - continue ASA 81 mg - continue statin, LDL 50 in 08/2021 - Coreg on 6.25 mg PO BID - has CTZ 25 mg PO daily and irbesartan 300 mg PO daily  - no PRN nitro needed  Subclavian Stenosis - Treatment is indicated for symptomatic patients with arm  claudication, cerebral hypoperfusion, and subclavian steal syndrome (if any sx will refer to Dr. Fletcher Anon again) - Will use right arm blood pressures   - continue ASA 81 mg PO daily   Medication Adjustments/Labs and  Tests Ordered: Current medicines are reviewed at length with the patient today.  Concerns regarding medicines are outlined above.  No orders of the defined types were placed in this encounter.   No orders of the defined types were placed in this encounter.    Patient Instructions  Medication Instructions:  Your physician recommends that you continue on your current medications as directed. Please refer to the Current Medication list given to you today.  *If you need a refill on your cardiac medications before your next appointment, please call your pharmacy*   Lab Work: NONE If you have labs (blood work) drawn today and your tests are completely normal, you will receive your results only by: [place] (if you have MyChart) OR A paper copy in the mail If you have any lab test that is abnormal or we need to change your treatment, we will call you to review the results.   Testing/Procedures: NONE   Follow-Up: At [facility], you and your health needs are our priority.  As part of our continuing mission to provide you with exceptional heart care, we have created designated Provider Care Teams.  These Care Teams include your primary Cardiologist (physician) and Advanced Practice Providers (APPs -  Physician Assistants and Nurse Practitioners) who all work together to provide you with the care you need, when you need it.  We recommend signing up for the patient portal called "MyChart".  Sign up information is provided on this After Visit Summary.  MyChart is used to connect with patients for Virtual Visits (Telemedicine).  Patients are able to view lab/test results, encounter notes, upcoming appointments, etc.  Non-urgent messages can be sent to your provider as well.    To learn more about what you can do with MyChart, go to [email or web address].    Your next appointment:   1 year(s)  The format for your next appointment:   In Person  Provider:   Werner Lean, MD    Important Information About Sugar         Signed, Werner Lean, MD  01/27/2022 11:03 AM    [place]

## 2022-01-25 NOTE — Telephone Encounter (Signed)
Patient advised that Ozempic 5 boxes  patient assistance is ready for pick

## 2022-01-27 ENCOUNTER — Encounter: Payer: Self-pay | Admitting: Internal Medicine

## 2022-01-27 ENCOUNTER — Ambulatory Visit: Payer: Medicare Other | Admitting: Internal Medicine

## 2022-01-27 VITALS — BP 139/74 | HR 80 | Ht 62.0 in | Wt 158.0 lb

## 2022-01-27 DIAGNOSIS — E1169 Type 2 diabetes mellitus with other specified complication: Secondary | ICD-10-CM

## 2022-01-27 DIAGNOSIS — I25118 Atherosclerotic heart disease of native coronary artery with other forms of angina pectoris: Secondary | ICD-10-CM

## 2022-01-27 DIAGNOSIS — E785 Hyperlipidemia, unspecified: Secondary | ICD-10-CM | POA: Diagnosis not present

## 2022-01-27 DIAGNOSIS — I10 Essential (primary) hypertension: Secondary | ICD-10-CM | POA: Diagnosis not present

## 2022-01-27 DIAGNOSIS — I700 Atherosclerosis of aorta: Secondary | ICD-10-CM | POA: Diagnosis not present

## 2022-01-27 DIAGNOSIS — E119 Type 2 diabetes mellitus without complications: Secondary | ICD-10-CM

## 2022-01-27 DIAGNOSIS — I771 Stricture of artery: Secondary | ICD-10-CM

## 2022-01-27 NOTE — Patient Instructions (Signed)
Medication Instructions:  ?Your physician recommends that you continue on your current medications as directed. Please refer to the Current Medication list given to you today. ? ?*If you need a refill on your cardiac medications before your next appointment, please call your pharmacy* ? ? ?Lab Work: ?NONE ?If you have labs (blood work) drawn today and your tests are completely normal, you will receive your results only by: ?MyChart Message (if you have MyChart) OR ?A paper copy in the mail ?If you have any lab test that is abnormal or we need to change your treatment, we will call you to review the results. ? ? ?Testing/Procedures: ?NONE ? ? ?Follow-Up: ?At [facility], you and your health needs are our priority.  As part of our continuing mission to provide you with exceptional heart care, we have created designated Provider Care Teams.  These Care Teams include your primary Cardiologist (physician) and Advanced Practice Providers (APPs -  Physician Assistants and Nurse Practitioners) who all work together to provide you with the care you need, when you need it. ? ?We recommend signing up for the patient portal called "MyChart".  Sign up information is provided on this After Visit Summary.  MyChart is used to connect with patients for Virtual Visits (Telemedicine).  Patients are able to view lab/test results, encounter notes, upcoming appointments, etc.  Non-urgent messages can be sent to your provider as well.   ?To learn more about what you can do with MyChart, go to [email or web address].   ? ?Your next appointment:   ?1 year(s) ? ?The format for your next appointment:   ?In Person ? ?Provider:   ?Mahesh A Chandrasekhar, MD   ? ? ?Important Information About Sugar ? ? ? ? ?  ?

## 2022-02-04 ENCOUNTER — Other Ambulatory Visit: Payer: Self-pay | Admitting: Orthopedic Surgery

## 2022-02-09 DIAGNOSIS — L218 Other seborrheic dermatitis: Secondary | ICD-10-CM | POA: Diagnosis not present

## 2022-02-09 DIAGNOSIS — D492 Neoplasm of unspecified behavior of bone, soft tissue, and skin: Secondary | ICD-10-CM | POA: Diagnosis not present

## 2022-02-09 DIAGNOSIS — L650 Telogen effluvium: Secondary | ICD-10-CM | POA: Diagnosis not present

## 2022-02-09 DIAGNOSIS — C44629 Squamous cell carcinoma of skin of left upper limb, including shoulder: Secondary | ICD-10-CM | POA: Diagnosis not present

## 2022-03-01 ENCOUNTER — Telehealth: Payer: Self-pay

## 2022-03-01 ENCOUNTER — Ambulatory Visit: Payer: Medicare Other | Admitting: Podiatry

## 2022-03-01 NOTE — Telephone Encounter (Signed)
Patient states that her freestyle reader is reading about 50 points higher than her fingerstick readings. She doesn't know which one is correct. Patient is going to try to calibrate reader and give a call back if it still seems off.  Message sent to Windell Moulding, NP

## 2022-03-01 NOTE — Telephone Encounter (Signed)
Thank you  for update

## 2022-03-07 NOTE — Progress Notes (Unsigned)
Name: Annette Suarez  Age/ Sex: 81 y.o., female   MRN/ DOB: [id number], [birth year]     PCP: [name], NP   Reason for Endocrinology Evaluation: Type 2 Diabetes Mellitus  Initial Endocrine Consultative Visit: 05/11/2021    PATIENT IDENTIFIER: [name] is a 81 y.o. female with a past medical history of T2DM, HTN, RA and dyslipidemia. The patient has followed with Endocrinology clinic since 05/11/2021 for consultative assistance with management of her diabetes.  DIABETIC HISTORY:  Annette Suarez was diagnosed with DM in ~ 1995 . She is intolerant to Metformin- nausea and Jardiance due to UTI's .Her hemoglobin A1c has ranged from 7.5% in 2021, peaking at 8.6% in 2023.  Transitioned care from Dr. Loanne Drilling 11/4648  Started trulicity in 35/4656  THYROID HISTORY : She has been diagnosed with MNG based on thyroid ultrasound in 2011 showing multiple thyroid nodules. She is s/p benign cytology of the left middle and left inferior nodules in 2011. Ultrasound in 2021 confirmed at least a 5 year stability    SUBJECTIVE:   During the last visit (11/10/2021): A1c 7.6% .decreased tresiba and continued Ozempic   Today (03/09/2022): Annette Suarez is here to establish care for diabetes. She has been checking blood sugars multiple times daily through CGM. The patient has not had hypoglycemic episodes since the last clinic visit.  She has occasional nausea with Ozempic but no diarrhea  Has hair loss for the past  2 months , saw dermatology   Denies any local neck symptoms     H/OME DIABETES REGIMEN:  Tresiba 60 units daily  Ozempic 0.5 mg weekly ( Weekly)    Statin: yes ACE-I/ARB: yes     CONTINUOUS GLUCOSE MONITORING RECORD INTERPRETATION    Dates of Recording: 8/23-03/09/2022  Sensor description:freestyle libre  Results statistics:   CGM use % of time 71  Average and SD 220/29  Time in range    31  %  % Time Above 180 38  % Time above 250 31  % Time Below target 0     Glycemic  patterns summary:BG's optimal at night but high during the day   Hyperglycemic episodes  postprandial   Hypoglycemic episodes occurred n/a  Overnight periods: optimal     DIABETIC COMPLICATIONS: Microvascular complications:  CKD III Denies: retinopathy, neuropathy  Last Eye Exam: Completed 07/2020  Macrovascular complications:   Denies: CAD, CVA, PVD   HISTORY:  Past Medical History:  Past Medical History:  Diagnosis Date   Acute cystitis    UTI   Anxiety    Arthritis    Benign positional vertigo    Dermatitis    Diabetes mellitus without complication (HCC)    Family history of adverse reaction to anesthesia    GERD (gastroesophageal reflux disease)    Goiter, nontoxic, multinodular    Gout    H/O: hysterectomy    History of tobacco use    Hx laparoscopic cholecystectomy    Hypercholesterolemia    Hypertension    Incomplete bladder emptying    Insomnia    Postmenopausal atrophic vaginitis    RA (rheumatoid arthritis) ([place])    Rectocele    Renal calculus    Sleep apnea    Thyroid condition    Type 2 diabetes mellitus ([place])    Urolithiasis    Vaginal enterocele    Past Surgical History:  Past Surgical History:  Procedure Laterality Date   CARPAL TUNNEL RELEASE  2018   CATARACT EXTRACTION, BILATERAL  CHOLECYSTECTOMY     COLONOSCOPY     07/11/2009, 02/2015   GALLBLADDER SURGERY     KNEE ARTHROSCOPY Right 05/20/2020   Procedure: RIGHT KNEE ARTHROSCOPY AND DEBRIDEMENT;  Surgeon: [name], MD;  Location: [facility];  Service: Orthopedics;  Laterality: Right;   PARTIAL HYSTERECTOMY     Overies left intact   SQUAMOUS CELL CARCINOMA EXCISION  05/2018   Social History:  reports that she quit smoking about 43 years ago. Her smoking use included cigarettes. She has a 2.00 pack-year smoking history. She has never used smokeless tobacco. She reports that she does not drink alcohol and does not use drugs. Family History:  Family History   Problem Relation Age of Onset   Diabetes Mother    Heart disease Father 81   Diabetes Son    Crohn's disease Son      HOME MEDICATIONS: Allergies as of 03/09/2022       Reactions   Amaryl [glimepiride]    Don't Remember   Amlodipine    LE edema on 7.'5mg'$  daily. Tolerates '5mg'$  daily just fine   Codeine    SICK   Jardiance [empagliflozin]    Urinary tract infection- SIDE EFFECT   Magnesium Citrate    Strong urine odor and dysuria   Pneumovax 23 [pneumococcal Vac Polyvalent]    Red / Swelling / Hot to touch SIDE EFFECTS   Wellbutrin [bupropion]    Unknown   Latex Rash        Medication List        Accurate as of March 09, 2022 11:29 AM. If you have any questions, ask your nurse or doctor.          Accu-Chek Guide test strip Generic drug: glucose blood USE TO CHECK BLOOD SUGAR THREE TIMES A DAY   Accu-Chek Softclix Lancets lancets Use to test blood sugar three times daily. Dx: E11.8   aspirin EC 81 MG tablet Take 81 mg by mouth daily.   Biotin 1 MG Caps Take by mouth.   carvedilol 6.25 MG tablet Commonly known as: COREG Take 1 tablet (6.25 mg total) by mouth 2 (two) times daily.   chlorthalidone 25 MG tablet Commonly known as: HYGROTON Take 1 tablet (25 mg total) by mouth daily. Please keep July appointment for future refills. Thank you.   CRANBERRY PO Take by mouth daily.   Enbrel SureClick 50 MG/ML injection Generic drug: etanercept Inject 50 mg into the skin once a week.   estradiol 0.1 MG/GM vaginal cream Commonly known as: ESTRACE Place 1 Applicatorful vaginally 3 (three) times a week.   fluocinonide 0.05 % external solution Commonly known as: LIDEX Apply 1 Application topically daily as needed.   FreeStyle Libre 2 Reader [employer] 2 Sensor Misc USE TO CHECK BLOOD SUGAR **CHANGE EVERY 14 DAYS**   Insulin Pen Needle 31G X 5 MM Misc 1 Device by Does not apply route daily in the afternoon.   irbesartan 300 MG  tablet Commonly known as: AVAPRO TAKE ONE TABLET BY MOUTH DAILY   loperamide 2 MG tablet Commonly known as: IMODIUM A-D Take 2 mg by mouth as needed for diarrhea or loose stools.   LORazepam 0.5 MG tablet Commonly known as: ATIVAN TAKE ONE TABLET BY MOUTH EVERY NIGHT AT BEDTIME   MELATONIN PO Take 10 mg by mouth at bedtime as needed.   nitroGLYCERIN 0.4 MG SL tablet Commonly known as: NITROSTAT Place 1 tablet (0.4 mg total) under the  tongue every 5 (five) minutes as needed for chest pain.   Omeprazole 20 MG Tbdd Take 20 mg by mouth daily.   ondansetron 4 MG tablet Commonly known as: ZOFRAN Take 4 mg by mouth 3 (three) times daily as needed for nausea or vomiting.   Ozempic (0.25 or 0.5 MG/DOSE) 2 MG/1.5ML Sopn Generic drug: Semaglutide(0.25 or 0.'5MG'$ /DOS) Inject 0.5 mg into the skin once a week.   PROBIOTIC PO Take by mouth daily.   simvastatin 20 MG tablet Commonly known as: ZOCOR TAKE ONE TABLET BY MOUTH EVERY EVENING   Tresiba FlexTouch 200 UNIT/ML FlexTouch Pen Generic drug: insulin degludec Inject 56 Units into the skin daily in the afternoon.   Vitamin D (Ergocalciferol) 1.25 MG (50000 UNIT) Caps capsule Commonly known as: DRISDOL Take 50,000 Units by mouth every 7 (seven) days.   VOLTAREN EX Apply 1 application topically 3 (three) times daily as needed.         OBJECTIVE:   Vital Signs: BP 122/80 (BP Location: Left Arm, Patient Position: Sitting, Cuff Size: Small)   Pulse 78   Ht '5\' 2"'$  (1.575 m)   Wt 155 lb 9.6 oz (70.6 kg)   SpO2 98%   BMI 28.46 kg/m   Wt Readings from Last 3 Encounters:  03/09/22 155 lb 9.6 oz (70.6 kg)  01/27/22 158 lb (71.7 kg)  01/14/22 158 lb (71.7 kg)     Exam: General: Pt appears well and is in NAD  Neck: General: Supple without adenopathy. Thyroid: Thyroid is prominent on the right  Lungs: Clear with good BS bilat with no rales, rhonchi, or wheezes  Heart: RRR with normal S1 and S2 and no gallops; no murmurs; no  rub  Abdomen: Normoactive bowel sounds, soft, nontender, without masses or organomegaly palpable  Extremities: No pretibial edema.  Neuro: MS is good with appropriate affect, pt is alert and Ox3   DM Foot Exam 11/10/2021  The skin of the feet is intact without sores or ulcerations. The pedal pulses are 2+ on right and 2+ on left. The sensation is intact to a screening 5.07, 10 gram monofilament bilaterally    DATA REVIEWED:  Lab Results  Component Value Date   HGBA1C 8.7 (A) 03/09/2022   HGBA1C 7.6 (A) 11/10/2021   HGBA1C 8.6 (A) 07/14/2021   Lab Results  Component Value Date   MICROALBUR 1.2 06/05/2020   LDLCALC 50 08/06/2021   CREATININE 1.10 (H) 12/30/2021   Lab Results  Component Value Date   MICRALBCREAT 14 06/05/2020     Lab Results  Component Value Date   CHOL 112 08/06/2021   HDL 39 (L) 08/06/2021   LDLCALC 50 08/06/2021   TRIG 146 08/06/2021   CHOLHDL 2.9 08/06/2021        FNA of left medial 02/11/2010  INTERPRETATION(S):   THYROID, FINE NEEDLE ASPIRATION,LEFT #1:   Findings consistent with non-neoplastic goiter     FNA left inferior thyroid nodule 02/11/2010  INTERPRETATION(S):   THYROID, FINE NEEDLE ASPIRATION, LEFT, #2:   Findings consistent with non-neoplastic goiter.   ASSESSMENT / PLAN / RECOMMENDATIONS:   1) Type 2 Diabetes Mellitus, Poorly  controlled, With CKD III complications - Most recent A1c of 8.7 %. Goal A1c < 7.5 %.    - A1c trended up from 7.6 %  - She is on pt assistance for Ozempic, she declines increasing the dose due to nausea, she seems to attributes recent change to her body to possible Ozempic use, she even called FDA for  hair loss regarding Ozempic  - I did offer stopping Ozempic and starting her on prandial insulin to take with each meal but will hold off on this for now  - Limited exercise due to knee pains - Will increase insulin as below  - She has noted 50 point difference between finger stick and CGM , I have  advised her to use finger stick if she notices hypoglycemia on CGM   MEDICATIONS: Continue Ozempic 0.5 mg weekly(samples) Increase  Tresiba 64 units daily  EDUCATION / INSTRUCTIONS: BG monitoring instructions: Patient is instructed to check her blood sugars 2 times a day, fasting and suppertime. Call [facility] if: BG persistently < 70  I reviewed the Rule of 15 for the treatment of hypoglycemia in detail with the patient. Literature supplied.   2) Diabetic complications:  Eye: Does not have known diabetic retinopathy.  Neuro/ Feet: Does not have known diabetic peripheral neuropathy .  Renal: Patient does  have known baseline CKD. She   is  on an ACEI/ARB at present.    3) Dyslipidemia :   - Historically LDL at goal , no change    Medication  Simvastatin 20 mg daily    4) Multinodular goiter:  -No local neck symptoms -History of benign FNA of left mid nodule and left inferior nodule in 2011 -Last ultrasound was in 2021 and 5-year stability has been confirmed and no further ultrasounds have been recommended -Reassurance provided    F/U in 6 months         Signed electronically by: Mack Guise, MD  [facility]  [facility] [address] [place], [place] [address] Phone: [phone] FAX: [phone]   CC: [name], NP [address] [place] [address] Phone: [phone]  Fax: [phone]  Return to Endocrinology clinic as below: Future Appointments  Date Time Provider [place]  05/11/2022  9:40 AM Lauree Chandler, NP [facility] None  06/01/2022 10:00 AM Ofilia Neas, PA-C [facility] None  10/21/2022 10:00 AM [name], NP [facility] None

## 2022-03-09 ENCOUNTER — Encounter: Payer: Self-pay | Admitting: Internal Medicine

## 2022-03-09 ENCOUNTER — Ambulatory Visit: Payer: Medicare Other | Admitting: Internal Medicine

## 2022-03-09 VITALS — BP 122/80 | HR 78 | Ht 62.0 in | Wt 155.6 lb

## 2022-03-09 DIAGNOSIS — N1831 Chronic kidney disease, stage 3a: Secondary | ICD-10-CM

## 2022-03-09 DIAGNOSIS — E042 Nontoxic multinodular goiter: Secondary | ICD-10-CM | POA: Diagnosis not present

## 2022-03-09 DIAGNOSIS — E785 Hyperlipidemia, unspecified: Secondary | ICD-10-CM | POA: Diagnosis not present

## 2022-03-09 DIAGNOSIS — Z794 Long term (current) use of insulin: Secondary | ICD-10-CM

## 2022-03-09 DIAGNOSIS — E1165 Type 2 diabetes mellitus with hyperglycemia: Secondary | ICD-10-CM | POA: Diagnosis not present

## 2022-03-09 DIAGNOSIS — E1122 Type 2 diabetes mellitus with diabetic chronic kidney disease: Secondary | ICD-10-CM | POA: Diagnosis not present

## 2022-03-09 LAB — POCT GLYCOSYLATED HEMOGLOBIN (HGB A1C): Hemoglobin A1C: 8.7 % — AB (ref 4.0–5.6)

## 2022-03-09 MED ORDER — TRESIBA FLEXTOUCH 200 UNIT/ML ~~LOC~~ SOPN: 64.0000 [IU] | PEN_INJECTOR | Freq: Every day | SUBCUTANEOUS | 6 refills | Status: DC

## 2022-03-09 NOTE — Patient Instructions (Signed)
Increase  Tresiba 64 units ONCE Daily  Continue Ozempic 0.5 mg weekly     HOW TO TREAT LOW BLOOD SUGARS (Blood sugar LESS THAN 70 MG/DL) Please follow the RULE OF 15 for the treatment of hypoglycemia treatment (when your (blood sugars are less than 70 mg/dL)   STEP 1: Take 15 grams of carbohydrates when your blood sugar is low, which includes:  3-4 GLUCOSE TABS  OR 3-4 OZ OF JUICE OR REGULAR SODA OR ONE TUBE OF GLUCOSE GEL    STEP 2: RECHECK blood sugar in 15 MINUTES STEP 3: If your blood sugar is still low at the 15 minute recheck --> then, go back to STEP 1 and treat AGAIN with another 15 grams of carbohydrates.

## 2022-03-10 ENCOUNTER — Other Ambulatory Visit: Payer: Self-pay | Admitting: Orthopedic Surgery

## 2022-03-10 DIAGNOSIS — F419 Anxiety disorder, unspecified: Secondary | ICD-10-CM

## 2022-03-10 DIAGNOSIS — G4709 Other insomnia: Secondary | ICD-10-CM

## 2022-03-10 NOTE — Telephone Encounter (Signed)
Patient has request refill on medication Lorazepam 0.'5mg'$ . Patient last refill dated 08/27/2021. Patient has Non Opioid Contract on file dated 10/21/2022. Update Contract added to patient appointment notes. Medication pend and sent to PCP [name], NP for approval.

## 2022-03-21 ENCOUNTER — Other Ambulatory Visit: Payer: Self-pay | Admitting: Internal Medicine

## 2022-03-26 ENCOUNTER — Other Ambulatory Visit: Payer: Self-pay | Admitting: Rheumatology

## 2022-03-26 DIAGNOSIS — Z79899 Other long term (current) drug therapy: Secondary | ICD-10-CM

## 2022-03-26 DIAGNOSIS — M0609 Rheumatoid arthritis without rheumatoid factor, multiple sites: Secondary | ICD-10-CM

## 2022-03-26 MED ORDER — ENBREL SURECLICK 50 MG/ML ~~LOC~~ SOAJ: 50.0000 mg | SUBCUTANEOUS | 0 refills | Status: DC

## 2022-03-26 NOTE — Telephone Encounter (Signed)
Next Visit: 06/01/2022  Last Visit: 12/30/2021  Last Fill: 12/30/2021  XJ:OITGPQDIYM arthritis of multiple sites with negative rheumatoid factor   Current Dose per office note 12/30/2021: Enbrel 50 mg sq injections once weekly  Labs: 12/30/2021 CBC is normal.  CMP shows improvement in creatinine.  TB Gold: 07/29/2021 Neg    Okay to refill Enbrel?

## 2022-03-26 NOTE — Telephone Encounter (Signed)
Patient called the office requesting a refill of Enbrel be sent to [facility].

## 2022-03-29 ENCOUNTER — Other Ambulatory Visit: Payer: Self-pay

## 2022-03-29 DIAGNOSIS — Z79899 Other long term (current) drug therapy: Secondary | ICD-10-CM

## 2022-03-30 ENCOUNTER — Other Ambulatory Visit: Payer: Self-pay | Admitting: Orthopedic Surgery

## 2022-03-30 DIAGNOSIS — R300 Dysuria: Secondary | ICD-10-CM

## 2022-03-30 DIAGNOSIS — Z794 Long term (current) use of insulin: Secondary | ICD-10-CM

## 2022-03-30 DIAGNOSIS — R350 Frequency of micturition: Secondary | ICD-10-CM

## 2022-03-30 DIAGNOSIS — M0609 Rheumatoid arthritis without rheumatoid factor, multiple sites: Secondary | ICD-10-CM

## 2022-03-30 LAB — COMPLETE METABOLIC PANEL WITH GFR
AG Ratio: 1.8 (calc) (ref 1.0–2.5)
ALT: 10 U/L (ref 6–29)
AST: 11 U/L (ref 10–35)
Albumin: 4.3 g/dL (ref 3.6–5.1)
Alkaline phosphatase (APISO): 79 U/L (ref 37–153)
BUN/Creatinine Ratio: 21 (calc) (ref 6–22)
BUN: 26 mg/dL — ABNORMAL HIGH (ref 7–25)
CO2: 30 mmol/L (ref 20–32)
Calcium: 8.8 mg/dL (ref 8.6–10.4)
Chloride: 101 mmol/L (ref 98–110)
Creat: 1.25 mg/dL — ABNORMAL HIGH (ref 0.60–1.00)
Globulin: 2.4 g/dL (calc) (ref 1.9–3.7)
Glucose, Bld: 147 mg/dL — ABNORMAL HIGH (ref 65–99)
Potassium: 4.7 mmol/L (ref 3.5–5.3)
Sodium: 138 mmol/L (ref 135–146)
Total Bilirubin: 1.2 mg/dL (ref 0.2–1.2)
Total Protein: 6.7 g/dL (ref 6.1–8.1)
eGFR: 44 mL/min/{1.73_m2} — ABNORMAL LOW (ref >=60)

## 2022-03-30 LAB — CBC WITH DIFFERENTIAL/PLATELET
Absolute Monocytes: 775 cells/uL (ref 200–950)
Basophils Absolute: 91 cells/uL (ref 0–200)
Basophils Relative: 0.8 %
Eosinophils Absolute: 239 cells/uL (ref 15–500)
Eosinophils Relative: 2.1 %
HCT: 37.1 % (ref 35.0–45.0)
Hemoglobin: 12.9 g/dL (ref 11.7–15.5)
Lymphs Abs: 2633 cells/uL (ref 850–3900)
MCH: 32.3 pg (ref 27.0–33.0)
MCHC: 34.8 g/dL (ref 32.0–36.0)
MCV: 92.8 fL (ref 80.0–100.0)
MPV: 11 fL (ref 7.5–12.5)
Monocytes Relative: 6.8 %
Neutro Abs: 7661 cells/uL (ref 1500–7800)
Neutrophils Relative %: 67.2 %
Platelets: 302 10*3/uL (ref 140–400)
RBC: 4 10*6/uL (ref 3.80–5.10)
RDW: 12 % (ref 11.0–15.0)
Total Lymphocyte: 23.1 %
WBC: 11.4 10*3/uL — ABNORMAL HIGH (ref 3.8–10.8)

## 2022-03-30 NOTE — Progress Notes (Signed)
White cell count is elevated.  Which can be due to recent infection or steroid use.  Please ask patient.  Glucose is elevated.  Creatinine is elevated.  Please forward results to patient's PCP.

## 2022-03-30 NOTE — Progress Notes (Signed)
Please cal patient to schedule visit with me this Thursday 09/28.

## 2022-03-31 ENCOUNTER — Encounter: Payer: Self-pay | Admitting: Orthopedic Surgery

## 2022-04-01 ENCOUNTER — Encounter: Payer: Self-pay | Admitting: Orthopedic Surgery

## 2022-04-01 ENCOUNTER — Ambulatory Visit (INDEPENDENT_AMBULATORY_CARE_PROVIDER_SITE_OTHER): Payer: Medicare Other | Admitting: Orthopedic Surgery

## 2022-04-01 VITALS — BP 128/76 | HR 65 | Temp 98.0°F | Resp 17 | Ht 62.0 in | Wt 157.2 lb

## 2022-04-01 DIAGNOSIS — R350 Frequency of micturition: Secondary | ICD-10-CM | POA: Diagnosis not present

## 2022-04-01 DIAGNOSIS — N898 Other specified noninflammatory disorders of vagina: Secondary | ICD-10-CM

## 2022-04-01 DIAGNOSIS — M0609 Rheumatoid arthritis without rheumatoid factor, multiple sites: Secondary | ICD-10-CM | POA: Diagnosis not present

## 2022-04-01 DIAGNOSIS — D72829 Elevated white blood cell count, unspecified: Secondary | ICD-10-CM | POA: Diagnosis not present

## 2022-04-01 DIAGNOSIS — Z794 Long term (current) use of insulin: Secondary | ICD-10-CM | POA: Diagnosis not present

## 2022-04-01 DIAGNOSIS — E118 Type 2 diabetes mellitus with unspecified complications: Secondary | ICD-10-CM

## 2022-04-01 DIAGNOSIS — R300 Dysuria: Secondary | ICD-10-CM

## 2022-04-01 DIAGNOSIS — N1831 Chronic kidney disease, stage 3a: Secondary | ICD-10-CM | POA: Diagnosis not present

## 2022-04-01 NOTE — Patient Instructions (Addendum)
Please take vaginal cream (estradiol) once weekly for burning- if once a week does not work, try twice weekly  If vaginal burning subsides may try " Tucks" over the counter for hemorrhoid  Recommend stool softener for hard stools

## 2022-04-01 NOTE — Progress Notes (Signed)
Careteam: Patient Care Team: [name], NP as PCP - General (Adult Health Nurse Practitioner) Werner Lean, MD as PCP - Cardiology (Cardiology)  Seen by: Windell Moulding, AGNP-C  PLACE OF SERVICE:  [facility] Directive information    Allergies  Allergen Reactions   Amaryl [Glimepiride]     Don't Remember   Amlodipine     LE edema on 7.30m daily. Tolerates 524mdaily just fine   Codeine     SICK   Jardiance [Empagliflozin]     Urinary tract infection- SIDE EFFECT   Magnesium Citrate     Strong urine odor and dysuria   Pneumovax 23 [Pneumococcal Vac Polyvalent]     Red / Swelling / Hot to touch SIDE EFFECTS   Wellbutrin [Bupropion]     Unknown   Latex Rash    Chief Complaint  Patient presents with   Follow-up    Follow up on labs completed at specialty office     HPI: Patient is a [age].o. female seen today for acute visit due to abnormal labs.   Followed by Dr. DeEstanislado Pandyor RA. Remains on Enbrel. Recent lab work with elevated WBC 11.4. She denies cold symptoms or recent steroid use. Concerned she may be developing UTI. Reports increased dysuria and urinary frequency within past week. Afebrile. Vitals stable.   T2DM- glucose 147, followed by endocrinology, remains on Ozempic and Tresiba, no recent hypoglycemia, eye exam scheduled 07/2021.   BUN/creat 26/1.25, reports dehydration during lab visit.   UA/culture and urine microalbumin unable to be collected in office today. She was given 3 cups of water and was unable to urinate. Specimen cup given to patient, advised to bring back in 24 hours.   She has been having increased vaginal dryness near perineum, not using estradiol consistently.   Review of Systems:  Review of Systems  Constitutional:  Negative for chills, fever, malaise/fatigue and weight loss.  HENT:  Negative for congestion and sore throat.   Eyes:  Negative for blurred vision and double vision.  Respiratory:  Negative for cough,  shortness of breath and wheezing.   Cardiovascular:  Negative for chest pain and leg swelling.  Gastrointestinal:  Negative for abdominal pain, blood in stool, constipation, diarrhea, heartburn, nausea and vomiting.  Genitourinary:  Positive for dysuria and frequency. Negative for flank pain and hematuria.  Musculoskeletal:  Positive for joint pain. Negative for falls.  Neurological:  Negative for dizziness, weakness and headaches.  Endo/Heme/Allergies:  Negative for polydipsia.  Psychiatric/Behavioral:  Negative for depression and memory loss. The patient is not nervous/anxious and does not have insomnia.     Past Medical History:  Diagnosis Date   Acute cystitis    UTI   Anxiety    Arthritis    Benign positional vertigo    Dermatitis    Diabetes mellitus without complication ([facility]   Family history of adverse reaction to anesthesia    GERD (gastroesophageal reflux disease)    Goiter, nontoxic, multinodular    Gout    H/O: hysterectomy    History of tobacco use    Hx laparoscopic cholecystectomy    Hypercholesterolemia    Hypertension    Incomplete bladder emptying    Insomnia    Postmenopausal atrophic vaginitis    RA (rheumatoid arthritis) ([facility] [place]   Rectocele    Renal calculus    Sleep apnea    Thyroid condition    Type 2 diabetes mellitus ([facility]   Urolithiasis  Vaginal enterocele    Past Surgical History:  Procedure Laterality Date   CARPAL TUNNEL RELEASE  2018   CATARACT EXTRACTION, BILATERAL     CHOLECYSTECTOMY     COLONOSCOPY     07/11/2009, 02/2015   GALLBLADDER SURGERY     KNEE ARTHROSCOPY Right 05/20/2020   Procedure: RIGHT KNEE ARTHROSCOPY AND DEBRIDEMENT;  Surgeon: [name], MD;  Location: [facility];  Service: Orthopedics;  Laterality: Right;   PARTIAL HYSTERECTOMY     Overies left intact   SQUAMOUS CELL CARCINOMA EXCISION  05/2018   Social History:   reports that she quit smoking about 43 years ago. Her smoking use included  cigarettes. She has a 2.00 pack-year smoking history. She has never used smokeless tobacco. She reports that she does not drink alcohol and does not use drugs.  Family History  Problem Relation Age of Onset   Diabetes Mother    Heart disease Father 44   Diabetes Son    Crohn's disease Son     Medications: Patient's Medications  New Prescriptions   No medications on file  Previous Medications   ACCU-CHEK SOFTCLIX LANCETS LANCETS    Use to test blood sugar three times daily. Dx: E11.8   ASPIRIN EC 81 MG TABLET    Take 81 mg by mouth daily.   BIOTIN 1 MG CAPS    Take by mouth.   CARVEDILOL (COREG) 6.25 MG TABLET    Take 1 tablet (6.25 mg total) by mouth 2 (two) times daily.   CHLORTHALIDONE (HYGROTON) 25 MG TABLET    Take 1 tablet (25 mg total) by mouth daily.   CONTINUOUS BLOOD GLUC RECEIVER (FREESTYLE LIBRE 2 READER) DEVI       CONTINUOUS BLOOD GLUC SENSOR (FREESTYLE LIBRE 2 SENSOR) MISC    USE TO CHECK BLOOD SUGAR **CHANGE EVERY 14 DAYS**   CRANBERRY PO    Take by mouth daily.   DICLOFENAC SODIUM (VOLTAREN EX)    Apply 1 application topically 3 (three) times daily as needed.   ESTRADIOL (ESTRACE) 0.1 MG/GM VAGINAL CREAM    Place 1 Applicatorful vaginally 3 (three) times a week.   ETANERCEPT (ENBREL SURECLICK) 50 MG/ML INJECTION    Inject 50 mg into the skin once a week.   FLUOCINONIDE (LIDEX) 0.05 % EXTERNAL SOLUTION    Apply 1 Application topically daily as needed.   GLUCOSE BLOOD (ACCU-CHEK GUIDE) TEST STRIP    USE TO CHECK BLOOD SUGAR THREE TIMES A DAY   INSULIN DEGLUDEC (TRESIBA FLEXTOUCH) 200 UNIT/ML FLEXTOUCH PEN    Inject 64 Units into the skin daily in the afternoon.   INSULIN PEN NEEDLE 31G X 5 MM MISC    1 Device by Does not apply route daily in the afternoon.   IRBESARTAN (AVAPRO) 300 MG TABLET    TAKE ONE TABLET BY MOUTH DAILY   LOPERAMIDE (IMODIUM A-D) 2 MG TABLET    Take 2 mg by mouth as needed for diarrhea or loose stools.   LORAZEPAM (ATIVAN) 0.5 MG TABLET    TAKE ONE  TABLET BY MOUTH EVERY NIGHT AT BEDTIME   MELATONIN PO    Take 10 mg by mouth at bedtime as needed.   NITROGLYCERIN (NITROSTAT) 0.4 MG SL TABLET    Place 1 tablet (0.4 mg total) under the tongue every 5 (five) minutes as needed for chest pain.   OMEPRAZOLE 20 MG TBDD    Take 20 mg by mouth daily.   ONDANSETRON (ZOFRAN) 4 MG TABLET  Take 4 mg by mouth 3 (three) times daily as needed for nausea or vomiting.   PROBIOTIC PRODUCT (PROBIOTIC PO)    Take by mouth daily.   SEMAGLUTIDE,0.25 OR 0.5MG/DOS, (OZEMPIC, 0.25 OR 0.5 MG/DOSE,) 2 MG/1.5ML SOPN    Inject 0.5 mg into the skin once a week.   SIMVASTATIN (ZOCOR) 20 MG TABLET    TAKE ONE TABLET BY MOUTH EVERY EVENING   VITAMIN D, ERGOCALCIFEROL, (DRISDOL) 1.25 MG (50000 UNIT) CAPS CAPSULE    Take 50,000 Units by mouth every 7 (seven) days.  Modified Medications   No medications on file  Discontinued Medications   No medications on file    Physical Exam:  Vitals:   03/31/22 1555  Height: 5' 2" (1.575 m)   Body mass index is 28.46 kg/m. Wt Readings from Last 3 Encounters:  03/09/22 155 lb 9.6 oz (70.6 kg)  01/27/22 158 lb (71.7 kg)  01/14/22 158 lb (71.7 kg)    Physical Exam Vitals reviewed.  Constitutional:      General: She is not in acute distress. HENT:     Head: Normocephalic.  Eyes:     General:        Right eye: No discharge.        Left eye: No discharge.  Cardiovascular:     Rate and Rhythm: Normal rate and regular rhythm.     Pulses: Normal pulses.     Heart sounds: Normal heart sounds.  Pulmonary:     Effort: Pulmonary effort is normal. No respiratory distress.     Breath sounds: Normal breath sounds. No wheezing.  Abdominal:     General: Bowel sounds are normal. There is no distension.     Palpations: Abdomen is soft.     Tenderness: There is no abdominal tenderness.  Musculoskeletal:     Cervical back: Neck supple.     Right lower leg: No edema.     Left lower leg: No edema.  Skin:    General: Skin is warm  and dry.     Capillary Refill: Capillary refill takes less than 2 seconds.  Neurological:     General: No focal deficit present.     Mental Status: She is alert and oriented to person, place, and time.  Psychiatric:        Behavior: Behavior normal.     Labs reviewed: Basic Metabolic Panel: Recent Labs    04/09/21 1138 04/29/21 1049 09/07/21 1232 12/30/21 1109 03/29/22 1455  NA  --    < > 141 140 138  K  --    < > 4.0 4.4 4.7  CL  --    < > 100 102 101  CO2  --    < > _0 GLUCOSE  --    < > 147* 93 147*  BUN  --    < > 20 18 26*  CREATININE  --    < > 1.15* 1.10* 1.25*  CALCIUM  --    < > 9.3 9.5 8.8  TSH 1.81  --   --   --   --    < > = values in this interval not displayed.   Liver Function Tests: Recent Labs    07/29/21 1056 12/30/21 1109 03/29/22 1455  AST _1 ALT _2 BILITOT 1.0 1.4* 1.2  PROT 7.0 6.7 6.7   No results for input(s): "LIPASE", "AMYLASE" in the last 8760 hours. No results for input(s): "AMMONIA"  in the last 8760 hours. CBC: Recent Labs    07/29/21 1056 12/30/21 1109 03/29/22 1455  WBC 9.6 9.6 11.4*  NEUTROABS 6,499 6,298 7,661  HGB 12.7 13.3 12.9  HCT 38.4 38.9 37.1  MCV 95.3 93.5 92.8  PLT 332 336 302   Lipid Panel: Recent Labs    08/06/21 0929  CHOL 112  HDL 39*  LDLCALC 50  TRIG 146  CHOLHDL 2.9   TSH: Recent Labs    04/09/21 1138  TSH 1.81   A1C: Lab Results  Component Value Date   HGBA1C 8.7 (A) 03/09/2022     Assessment/Plan 1. Leukocytosis, unspecified type - WBC 11.4 - no cold symptoms - on Embrel - reports dysuria/frequency - repeat cbc/diff  2. Dysuria - h/o frequent UTI - unable to give urine sample in office, specimen cup given, advised to bring sample in 24 hours - encourage hydration with water - Culture, Urine - Urinalysis  3. Type 2 diabetes mellitus with complication, with long-term current use of insulin (HCC) - A1c 8.7 (09/05) > was 7.6  - no hypoglycemia - glucose  147 (09/25) - BMP with eGFR([facility]) - Microalbumin/Creatinine Ratio, Urine  4. Urinary frequency - see above - Culture, Urine - Urinalysis  5. Rheumatoid arthritis of multiple sites with negative rheumatoid factor (HCC) - followed by Dr. Estanislado Pandy - on Enbrel - see above - CBC with Differential/Platelet  6. Vaginal dryness - cont Estradiol once weekly or twice weekly   7. Stage 3a chronic kidney disease (HCC) - BUN/creat 26/1.25 (09/25) - GFR 44 (09/25) > was 51 (06/28)> was 49 (03/06) - avoid nephrotoxic drugs like NSAIDS and dose adjust medications to be renally excreted - encourage hydration with water  - repeat BMP with GFR  Total time: 31 minutes. Greater than 50% of total time spent doing patient education regarding T2DM, dysuria, lab results and health maintenance.     Next appt: Visit date not found  [place] [phone]

## 2022-04-02 DIAGNOSIS — Z794 Long term (current) use of insulin: Secondary | ICD-10-CM | POA: Diagnosis not present

## 2022-04-02 DIAGNOSIS — M0609 Rheumatoid arthritis without rheumatoid factor, multiple sites: Secondary | ICD-10-CM | POA: Diagnosis not present

## 2022-04-02 DIAGNOSIS — R350 Frequency of micturition: Secondary | ICD-10-CM | POA: Diagnosis not present

## 2022-04-02 DIAGNOSIS — E118 Type 2 diabetes mellitus with unspecified complications: Secondary | ICD-10-CM | POA: Diagnosis not present

## 2022-04-02 DIAGNOSIS — R300 Dysuria: Secondary | ICD-10-CM | POA: Diagnosis not present

## 2022-04-02 LAB — CBC WITH DIFFERENTIAL/PLATELET
Absolute Monocytes: 619 cells/uL (ref 200–950)
Basophils Absolute: 82 cells/uL (ref 0–200)
Basophils Relative: 0.9 %
Eosinophils Absolute: 255 cells/uL (ref 15–500)
Eosinophils Relative: 2.8 %
HCT: 37.8 % (ref 35.0–45.0)
Hemoglobin: 13 g/dL (ref 11.7–15.5)
Lymphs Abs: 2148 cells/uL (ref 850–3900)
MCH: 31.5 pg (ref 27.0–33.0)
MCHC: 34.4 g/dL (ref 32.0–36.0)
MCV: 91.5 fL (ref 80.0–100.0)
MPV: 11.1 fL (ref 7.5–12.5)
Monocytes Relative: 6.8 %
Neutro Abs: 5997 cells/uL (ref 1500–7800)
Neutrophils Relative %: 65.9 %
Platelets: 289 10*3/uL (ref 140–400)
RBC: 4.13 10*6/uL (ref 3.80–5.10)
RDW: 12 % (ref 11.0–15.0)
Total Lymphocyte: 23.6 %
WBC: 9.1 10*3/uL (ref 3.8–10.8)

## 2022-04-02 LAB — BASIC METABOLIC PANEL WITH GFR
BUN/Creatinine Ratio: 24 (calc) — ABNORMAL HIGH (ref 6–22)
BUN: 25 mg/dL (ref 7–25)
CO2: 29 mmol/L (ref 20–32)
Calcium: 9.4 mg/dL (ref 8.6–10.4)
Chloride: 103 mmol/L (ref 98–110)
Creat: 1.06 mg/dL — ABNORMAL HIGH (ref 0.60–1.00)
Glucose, Bld: 71 mg/dL (ref 65–99)
Potassium: 4.5 mmol/L (ref 3.5–5.3)
Sodium: 140 mmol/L (ref 135–146)
eGFR: 53 mL/min/{1.73_m2} — ABNORMAL LOW (ref >=60)

## 2022-04-05 ENCOUNTER — Other Ambulatory Visit: Payer: Self-pay | Admitting: Orthopedic Surgery

## 2022-04-05 DIAGNOSIS — N3000 Acute cystitis without hematuria: Secondary | ICD-10-CM

## 2022-04-05 LAB — URINE CULTURE
MICRO NUMBER:: 13987853
SPECIMEN QUALITY:: ADEQUATE

## 2022-04-05 LAB — URINALYSIS
Bilirubin Urine: NEGATIVE
Glucose, UA: NEGATIVE
Hgb urine dipstick: NEGATIVE
Ketones, ur: NEGATIVE
Nitrite: NEGATIVE
Protein, ur: NEGATIVE
Specific Gravity, Urine: 1.011 (ref 1.001–1.035)
pH: 5.5 (ref 5.0–8.0)

## 2022-04-05 LAB — MICROALBUMIN / CREATININE URINE RATIO
Creatinine, Urine: 65 mg/dL (ref 20–275)
Microalb Creat Ratio: 8 mcg/mg creat (ref <30)
Microalb, Ur: 0.5 mg/dL

## 2022-04-05 MED ORDER — CIPROFLOXACIN HCL 500 MG PO TABS: 500.0000 mg | ORAL_TABLET | Freq: Every day | ORAL | 0 refills | Status: AC

## 2022-04-21 ENCOUNTER — Telehealth: Payer: Self-pay

## 2022-04-21 NOTE — Telephone Encounter (Signed)
Patient advised that Ozempic patient assistance is ready for pick up.

## 2022-04-22 NOTE — Telephone Encounter (Signed)
Patient has picked up Ozempic patient assistance.

## 2022-04-26 DIAGNOSIS — L578 Other skin changes due to chronic exposure to nonionizing radiation: Secondary | ICD-10-CM | POA: Diagnosis not present

## 2022-04-26 DIAGNOSIS — L218 Other seborrheic dermatitis: Secondary | ICD-10-CM | POA: Diagnosis not present

## 2022-04-26 DIAGNOSIS — L650 Telogen effluvium: Secondary | ICD-10-CM | POA: Diagnosis not present

## 2022-05-11 ENCOUNTER — Ambulatory Visit (INDEPENDENT_AMBULATORY_CARE_PROVIDER_SITE_OTHER): Payer: Medicare Other | Admitting: Nurse Practitioner

## 2022-05-11 ENCOUNTER — Encounter: Payer: Self-pay | Admitting: Nurse Practitioner

## 2022-05-11 ENCOUNTER — Telehealth: Payer: Self-pay

## 2022-05-11 DIAGNOSIS — E2839 Other primary ovarian failure: Secondary | ICD-10-CM

## 2022-05-11 DIAGNOSIS — Z0000 Encounter for general adult medical examination without abnormal findings: Secondary | ICD-10-CM

## 2022-05-11 MED ORDER — ONDANSETRON HCL 4 MG PO TABS: 4.0000 mg | ORAL_TABLET | Freq: Three times a day (TID) | ORAL | 0 refills | Status: DC | PRN

## 2022-05-11 NOTE — Progress Notes (Signed)
Subjective:   Annette Suarez is a 81 y.o. female who presents for Medicare Annual (Subsequent) preventive examination.  Review of Systems           Objective:    There were no vitals filed for this visit. There is no height or weight on file to calculate BMI.     05/11/2022    9:38 AM 04/01/2022    9:08 AM 01/14/2022    8:38 AM 10/05/2021   10:07 AM 05/05/2021    8:37 AM 03/05/2021    4:13 PM 02/11/2021   10:45 AM  Advanced Directives  Does Patient Have a Medical Advance Directive? No Yes No Yes No No No  Type of Corporate treasurer of [place];Living will       Does patient want to make changes to medical advance directive?  No - Patient declined       Copy of [place] in Chart?  No - copy requested       Would patient like information on creating a medical advance directive?   No - Patient declined   No - Patient declined No - Patient declined    Current Medications (verified) Outpatient Encounter Medications as of 05/11/2022  Medication Sig   Accu-Chek Softclix Lancets lancets Use to test blood sugar three times daily. Dx: E11.8   aspirin EC 81 MG tablet Take 81 mg by mouth daily.   Biotin 1 MG CAPS Take by mouth.   carvedilol (COREG) 6.25 MG tablet Take 1 tablet (6.25 mg total) by mouth 2 (two) times daily.   chlorthalidone (HYGROTON) 25 MG tablet Take 1 tablet (25 mg total) by mouth daily.   Cholecalciferol (VITAMIN D3) 50 MCG (2000 UT) capsule Take 2,000 Units by mouth daily.   Continuous Blood Gluc Receiver (FREESTYLE LIBRE 2 READER) DEVI    Continuous Blood Gluc Sensor (FREESTYLE LIBRE 2 SENSOR) MISC USE TO CHECK BLOOD SUGAR **CHANGE EVERY 14 DAYS**   Diclofenac Sodium (VOLTAREN EX) Apply 1 application topically 3 (three) times daily as needed.   estradiol (ESTRACE) 0.1 MG/GM vaginal cream Place 1 Applicatorful vaginally 3 (three) times a week.   etanercept (ENBREL SURECLICK) 50 MG/ML injection Inject 50 mg into the skin once a week.    fluocinonide (LIDEX) 0.05 % external solution Apply 1 Application topically daily as needed.   insulin degludec (TRESIBA FLEXTOUCH) 200 UNIT/ML FlexTouch Pen Inject 64 Units into the skin daily in the afternoon.   Insulin Pen Needle 31G X 5 MM MISC 1 Device by Does not apply route daily in the afternoon.   irbesartan (AVAPRO) 300 MG tablet TAKE ONE TABLET BY MOUTH DAILY   loperamide (IMODIUM A-D) 2 MG tablet Take 2 mg by mouth as needed for diarrhea or loose stools.   LORazepam (ATIVAN) 0.5 MG tablet TAKE ONE TABLET BY MOUTH EVERY NIGHT AT BEDTIME   MELATONIN PO Take 10 mg by mouth at bedtime as needed.   nitroGLYCERIN (NITROSTAT) 0.4 MG SL tablet Place 1 tablet (0.4 mg total) under the tongue every 5 (five) minutes as needed for chest pain.   Omeprazole 20 MG TBDD Take 20 mg by mouth daily.   ondansetron (ZOFRAN) 4 MG tablet Take 4 mg by mouth 3 (three) times daily as needed for nausea or vomiting.   Probiotic Product (PROBIOTIC PO) Take by mouth daily.   Semaglutide,0.25 or 0.'5MG'$ /DOS, (OZEMPIC, 0.25 OR 0.5 MG/DOSE,) 2 MG/1.5ML SOPN Inject 0.5 mg into the skin once a week.  simvastatin (ZOCOR) 20 MG tablet TAKE ONE TABLET BY MOUTH EVERY EVENING   [DISCONTINUED] Vitamin D, Ergocalciferol, (DRISDOL) 1.25 MG (50000 UNIT) CAPS capsule Take 50,000 Units by mouth every 7 (seven) days. (Patient not taking: Reported on 05/11/2022)   No facility-administered encounter medications on file as of 05/11/2022.    Allergies (verified) Amaryl [glimepiride], Amlodipine, Codeine, Jardiance [empagliflozin], Magnesium citrate, Pneumovax 23 [pneumococcal vac polyvalent], Wellbutrin [bupropion], and Latex   History: Past Medical History:  Diagnosis Date   Acute cystitis    UTI   Anxiety    Arthritis    Benign positional vertigo    Dermatitis    Diabetes mellitus without complication ([place])    Family history of adverse reaction to anesthesia    GERD (gastroesophageal reflux disease)    Goiter, nontoxic,  multinodular    Gout    H/O: hysterectomy    History of tobacco use    Hx laparoscopic cholecystectomy    Hypercholesterolemia    Hypertension    Incomplete bladder emptying    Insomnia    Postmenopausal atrophic vaginitis    RA (rheumatoid arthritis) ([place])    Rectocele    Renal calculus    Sleep apnea    Thyroid condition    Type 2 diabetes mellitus ([place])    Urolithiasis    Vaginal enterocele    Past Surgical History:  Procedure Laterality Date   CARPAL TUNNEL RELEASE  2018   CATARACT EXTRACTION, BILATERAL     CHOLECYSTECTOMY     COLONOSCOPY     07/11/2009, 02/2015   GALLBLADDER SURGERY     KNEE ARTHROSCOPY Right 05/20/2020   Procedure: RIGHT KNEE ARTHROSCOPY AND DEBRIDEMENT;  Surgeon: [name], MD;  Location: [facility];  Service: Orthopedics;  Laterality: Right;   PARTIAL HYSTERECTOMY     [place] left intact   SQUAMOUS CELL CARCINOMA EXCISION  05/2018   Family History  Problem Relation Age of Onset   Diabetes Mother    Heart disease Father 23   Diabetes Son    Crohn's disease Son    Social History   Socioeconomic History   Marital status: Married    Spouse name: Not on file   Number of children: Not on file   Years of education: Not on file   Highest education level: Not on file  Occupational History   Not on file  Tobacco Use   Smoking status: Former    Packs/day: 0.20    Years: 10.00    Total pack years: 2.00    Types: Cigarettes    Quit date: 07/05/1978    Years since quitting: 43.8   Smokeless tobacco: Never  Vaping Use   Vaping Use: Never used  Substance and Sexual Activity   Alcohol use: No   Drug use: No   Sexual activity: Not Currently  Other Topics Concern   Not on file  Social History Narrative   Diet: Blank      Do you drink/ eat things with caffeine? Yes      Marital status:  Married                             What year were you married ? 196      Do you live in a house, apartment,assistred living, condo,  trailer, etc.)? House      Is it one or more stories? 1      How many persons live in  your home ? 2      Do you have any pets in your home ?(please list)  1 Sam-Dog      Highest Level of education completed: 12      Current or past profession: Office      Do you exercise?   Some                           Type & how often  Walk Daily      ADVANCED DIRECTIVES (Please bring copies)      Do you have a living will? Blank      Do you have a DNR form?  Blank                     If not, do you want to discuss one?       Do you have signed POA?HPOA forms?  Yes               If so, please bring to your appointment      FUNCTIONAL STATUS- To be completed by Spouse / child / Staff       Do you have difficulty bathing or dressing yourself ?  No      Do you have difficulty preparing food or eating ?  No      Do you have difficulty managing your mediation ?  No      Do you have difficulty managing your finances ?  No      Do you have difficulty affording your medication ?  Yes (Some)      Social Determinants of Health   Financial Resource Strain: Not on file  Food Insecurity: Not on file  Transportation Needs: Not on file  Physical Activity: Not on file  Stress: Not on file  Social Connections: Not on file    Tobacco Counseling Counseling given: Not Answered   Clinical Intake:                 Diabetic?yes         Activities of Daily Living     No data to display          Patient Care Team: [name], NP as PCP - General (Adult Health Nurse Practitioner) Werner Lean, MD as PCP - Cardiology (Cardiology)  Indicate any recent Medical Services you may have received from other than [facility] providers in the past year (date may be approximate).     Assessment:   This is a routine wellness examination for Vara.  Hearing/Vision screen Hearing Screening - Comments:: No hearing issues  Vision Screening - Comments:: Last eye exam less than 12  months ago, pending appointment in December 2023 or Jan 2024   Dietary issues and exercise activities discussed:     Goals Addressed   None    Depression Screen    05/11/2022    9:37 AM 04/01/2022    9:08 AM 10/05/2021   10:07 AM 05/05/2021    8:36 AM 06/24/2020    1:09 PM  PHQ 2/9 Scores  PHQ - 2 Score 0 0 0 0 0    Fall Risk    05/11/2022    9:37 AM 04/01/2022    9:07 AM 01/14/2022    8:38 AM 10/05/2021   10:07 AM 08/06/2021    9:08 AM  Fall Risk   Falls in the past year? 0 0 0 0 0  Number falls in past yr: 0 0 0  0  Injury with Fall? 0 0 0  0  Risk for fall due to : No Fall Risks No Fall Risks No Fall Risks  No Fall Risks  Follow up Falls evaluation completed Falls evaluation completed Falls evaluation completed  Falls evaluation completed    [place]:  Any stairs in or around the home? Yes  If so, are there any without handrails? No  Home free of loose throw rugs in walkways, pet beds, electrical cords, etc? Yes  Adequate lighting in your home to reduce risk of falls? Yes   ASSISTIVE DEVICES UTILIZED TO PREVENT FALLS:  Life alert? Yes  Use of a cane, walker or w/c? Yes  Grab bars in the bathroom? Yes  Shower chair or bench in shower? Yes  Elevated toilet seat or a handicapped toilet? No   TIMED UP AND GO:  Was the test performed? No .   Cognitive Function:    06/24/2020    1:18 PM  MMSE - Mini Mental State Exam  Orientation to time 5  Orientation to Place 5  Registration 3  Attention/ Calculation 5  Recall 3  Language- name 2 objects 2  Language- repeat 1  Language- follow 3 step command 3  Language- read & follow direction 1  Write a sentence 1  Copy design 0  Copy design-comments passed clock test  Total score 29        05/11/2022    9:39 AM 05/05/2021    8:38 AM  6CIT Screen  What Year? 0 points 0 points  What month? 0 points 0 points  What time? 0 points 0 points  Count back from 20 0 points 0 points   Months in reverse 0 points 0 points  Repeat phrase 2 points 2 points  Total Score 2 points 2 points    Immunizations Immunization History  Administered Date(s) Administered   Fluad Quad(high Dose 65+) 04/09/2021   Influenza Split 03/28/2008, 03/05/2010, 04/17/2012, 04/20/2013   Influenza, High Dose Seasonal PF 02/08/2020, 05/04/2022   Influenza-Unspecified 05/11/2011, 04/22/2014, 04/15/2015, 02/20/2016, 03/02/2017, 03/09/2018, 03/20/2019, 03/21/2020   PFIZER(Purple Top)SARS-COV-2 Vaccination 07/24/2019, 08/06/2019, 08/27/2019, 04/21/2020   Pneumococcal Conjugate-13 05/02/2014   Pneumococcal Polysaccharide-23 04/23/2008   Tdap 05/01/2013   Zoster, Live 04/17/2012    TDAP status: Up to date  Flu Vaccine status: Up to date  Pneumococcal vaccine status: Up to date  Covid-19 vaccine status: Declined, Education has been provided regarding the importance of this vaccine but patient still declined. Advised may receive this vaccine at local pharmacy or Health Dept.or vaccine clinic. Aware to provide a copy of the vaccination record if obtained from local pharmacy or Health Dept. Verbalized acceptance and understanding.  Qualifies for Shingles Vaccine? Yes   Zostavax completed No   Shingrix Completed?: No.    Education has been provided regarding the importance of this vaccine. Patient has been advised to call insurance company to determine out of pocket expense if they have not yet received this vaccine. Advised may also receive vaccine at local pharmacy or Health Dept. Verbalized acceptance and understanding.  Screening Tests Health Maintenance  Topic Date Due   Medicare Annual Wellness (AWV)  05/05/2022   FOOT EXAM  05/11/2022   OPHTHALMOLOGY EXAM  08/04/2022 (Originally 06/04/2021)   HEMOGLOBIN A1C  09/07/2022   Diabetic kidney evaluation - GFR measurement  04/02/2023   Diabetic kidney evaluation - Urine ACR  04/03/2023   TETANUS/TDAP  05/02/2023   Pneumonia Vaccine 31+ Years old   Completed   INFLUENZA VACCINE  Completed   DEXA SCAN  Completed   Hepatitis C Screening  Completed   HPV VACCINES  Aged Out   COVID-19 Vaccine  Discontinued   Zoster Vaccines- Shingrix  Discontinued    Health Maintenance  Health Maintenance Due  Topic Date Due   Medicare Annual Wellness (AWV)  05/05/2022   FOOT EXAM  05/11/2022    Colorectal cancer screening: No longer required.   Mammogram status: No longer required due to aged out .  Bone Density status: Ordered today. Pt provided with contact info and advised to call to schedule appt.  Lung Cancer Screening: (Low Dose CT Chest recommended if Age 37-80 years, 30 pack-year currently smoking OR have quit w/in 15years.) does not qualify.   Lung Cancer Screening Referral: na  Additional Screening:  Hepatitis C Screening: does qualify; Completed 2018  Vision Screening: Recommended annual ophthalmology exams for early detection of glaucoma and other disorders of the eye. Is the patient up to date with their annual eye exam?  Yes  Who is the provider or what is the name of the office in which the patient attends annual eye exams? [facility] If pt is not established with a provider, would they like to be referred to a provider to establish care? No .   Dental Screening: Recommended annual dental exams for proper oral hygiene  Community Resource Referral / Chronic Care Management: CRR required this visit?  No   CCM required this visit?  No      Plan:     I have personally reviewed and noted the following in the patient's chart:   Medical and social history Use of alcohol, tobacco or illicit drugs  Current medications and supplements including opioid prescriptions. Patient is not currently taking opioid prescriptions. Functional ability and status Nutritional status Physical activity Advanced directives List of other physicians Hospitalizations, surgeries, and ER visits in previous 12 months Vitals Screenings to  include cognitive, depression, and falls Referrals and appointments  In addition, I have reviewed and discussed with patient certain preventive protocols, quality metrics, and best practice recommendations. A written personalized care plan for preventive services as well as general preventive health recommendations were provided to patient.     Lauree Chandler, NP   05/11/2022    Virtual Visit via Telephone Note  I connected with patient 05/11/22 at  9:40 AM EST by telephone and verified that I am speaking with the correct person using two identifiers.  Location: Patient: home Provider: [facility]   I discussed the limitations, risks, security and privacy concerns of performing an evaluation and management service by telephone and the availability of in person appointments. I also discussed with the patient that there may be a patient responsible charge related to this service. The patient expressed understanding and agreed to proceed.   I discussed the assessment and treatment plan with the patient. The patient was provided an opportunity to ask questions and all were answered. The patient agreed with the plan and demonstrated an understanding of the instructions.   The patient was advised to call back or seek an in-person evaluation if the symptoms worsen or if the condition fails to improve as anticipated.  I provided 14 minutes of non-face-to-face time during this encounter.  Carlos American. Harle Battiest Avs printed and mailed

## 2022-05-11 NOTE — Progress Notes (Signed)
   This service is provided via telemedicine  No vital signs collected/recorded due to the encounter was a telemedicine visit.   Location of patient (ex: home, work):  Home  Patient consents to a telephone visit: Yes, see telephone visit dated 05/11/22  Location of the provider (ex: office, home):  [place], Remote Location   Name of any referring provider:  [name], NP  Names of all persons participating in the telemedicine service and their role in the encounter:  S.Chrae B/CMA, Sherrie Mustache, NP, and Patient   Time spent on call:  11 min with medical assistant

## 2022-05-11 NOTE — Telephone Encounter (Signed)
Ms. tereza, g[place]ham are scheduled for a virtual visit with your provider today.    Just as we do with appointments in the office, we must obtain your consent to participate.  Your consent will be active for this visit and any virtual visit you may have with one of our providers in the next 365 days.    If you have a MyChart account, I can also send a copy of this consent to you electronically.  All virtual visits are billed to your insurance company just like a traditional visit in the office.  As this is a virtual visit, video technology does not allow for your provider to perform a traditional examination.  This may limit your provider's ability to fully assess your condition.  If your provider identifies any concerns that need to be evaluated in person or the need to arrange testing such as labs, EKG, etc, we will make arrangements to do so.    Although advances in technology are sophisticated, we cannot ensure that it will always work on either your end or our end.  If the connection with a video visit is poor, we may have to switch to a telephone visit.  With either a video or telephone visit, we are not always able to ensure that we have a secure connection.   I need to obtain your verbal consent now.   Are you willing to proceed with your visit today?   [name] MCNICHOLAS has provided verbal consent on 05/11/2022 for a virtual visit (video or telephone).   Leigh Aurora [place], [place] 05/11/2022  9:43 AM

## 2022-05-11 NOTE — Patient Instructions (Signed)
Annette Suarez , Thank you for taking time to come for your Medicare Wellness Visit. I appreciate your ongoing commitment to your health goals. Please review the following plan we discussed and let me know if I can assist you in the future.   Screening recommendations/referrals: Colonoscopy aged out Mammogram aged out Bone Density ordered today Recommended yearly ophthalmology/optometry visit for glaucoma screening and checkup Recommended yearly dental visit for hygiene and checkup  Vaccinations: Influenza vaccine- due annually in September/October Pneumococcal vaccine up to date Tdap vaccine up to date Shingles vaccine recommended to get through pharmacy     Advanced directives: recommended to bring to office to place on file.   Conditions/risks identified: advanced age, diabetes, hyperlipidemia  Next appointment: yearly- in person   Preventive Care 72 Years and Older, Female Preventive care refers to lifestyle choices and visits with your health care provider that can promote health and wellness. What does preventive care include? A yearly physical exam. This is also called an annual well check. Dental exams once or twice a year. Routine eye exams. Ask your health care provider how often you should have your eyes checked. Personal lifestyle choices, including: Daily care of your teeth and gums. Regular physical activity. Eating a healthy diet. Avoiding tobacco and drug use. Limiting alcohol use. Practicing safe sex. Taking low-dose aspirin every day. Taking vitamin and mineral supplements as recommended by your health care provider. What happens during an annual well check? The services and screenings done by your health care provider during your annual well check will depend on your age, overall health, lifestyle risk factors, and family history of disease. Counseling  Your health care provider may ask you questions about your: Alcohol use. Tobacco use. Drug use. Emotional  well-being. Home and relationship well-being. Sexual activity. Eating habits. History of falls. Memory and ability to understand (cognition). Work and work Statistician. Reproductive health. Screening  You may have the following tests or measurements: Height, weight, and BMI. Blood pressure. Lipid and cholesterol levels. These may be checked every 5 years, or more frequently if you are over 90 years old. Skin check. Lung cancer screening. You may have this screening every year starting at age 65 if you have a 30-pack-year history of smoking and currently smoke or have quit within the past 15 years. Fecal occult blood test (FOBT) of the stool. You may have this test every year starting at age 50. Flexible sigmoidoscopy or colonoscopy. You may have a sigmoidoscopy every 5 years or a colonoscopy every 10 years starting at age 11. Hepatitis C blood test. Hepatitis B blood test. Sexually transmitted disease (STD) testing. Diabetes screening. This is done by checking your blood sugar (glucose) after you have not eaten for a while (fasting). You may have this done every 1-3 years. Bone density scan. This is done to screen for osteoporosis. You may have this done starting at age 31. Mammogram. This may be done every 1-2 years. Talk to your health care provider about how often you should have regular mammograms. Talk with your health care provider about your test results, treatment options, and if necessary, the need for more tests. Vaccines  Your health care provider may recommend certain vaccines, such as: Influenza vaccine. This is recommended every year. Tetanus, diphtheria, and acellular pertussis (Tdap, Td) vaccine. You may need a Td booster every 10 years. Zoster vaccine. You may need this after age 31. Pneumococcal 13-valent conjugate (PCV13) vaccine. One dose is recommended after age 65. Pneumococcal polysaccharide (PPSV23) vaccine. One  dose is recommended after age 71. Talk to your  health care provider about which screenings and vaccines you need and how often you need them. This information is not intended to replace advice given to you by your health care provider. Make sure you discuss any questions you have with your health care provider. Document Released: 07/18/2015 Document Revised: 03/10/2016 Document Reviewed: 04/22/2015 Elsevier Interactive Patient Education  2017 [place] Prevention in the Home Falls can cause injuries. They can happen to people of all ages. There are many things you can do to make your home safe and to help prevent falls. What can I do on the outside of my home? Regularly fix the edges of walkways and driveways and fix any cracks. Remove anything that might make you trip as you walk through a door, such as a raised step or threshold. Trim any bushes or trees on the path to your home. Use bright outdoor lighting. Clear any walking paths of anything that might make someone trip, such as rocks or tools. Regularly check to see if handrails are loose or broken. Make sure that both sides of any steps have handrails. Any raised decks and porches should have guardrails on the edges. Have any leaves, snow, or ice cleared regularly. Use sand or salt on walking paths during winter. Clean up any spills in your garage right away. This includes oil or grease spills. What can I do in the bathroom? Use night lights. Install grab bars by the toilet and in the tub and shower. Do not use towel bars as grab bars. Use non-skid mats or decals in the tub or shower. If you need to sit down in the shower, use a plastic, non-slip stool. Keep the floor dry. Clean up any water that spills on the floor as soon as it happens. Remove soap buildup in the tub or shower regularly. Attach bath mats securely with double-sided non-slip rug tape. Do not have throw rugs and other things on the floor that can make you trip. What can I do in the bedroom? Use night  lights. Make sure that you have a light by your bed that is easy to reach. Do not use any sheets or blankets that are too big for your bed. They should not hang down onto the floor. Have a firm chair that has side arms. You can use this for support while you get dressed. Do not have throw rugs and other things on the floor that can make you trip. What can I do in the kitchen? Clean up any spills right away. Avoid walking on wet floors. Keep items that you use a lot in easy-to-reach places. If you need to reach something above you, use a strong step stool that has a grab bar. Keep electrical cords out of the way. Do not use floor polish or wax that makes floors slippery. If you must use wax, use non-skid floor wax. Do not have throw rugs and other things on the floor that can make you trip. What can I do with my stairs? Do not leave any items on the stairs. Make sure that there are handrails on both sides of the stairs and use them. Fix handrails that are broken or loose. Make sure that handrails are as long as the stairways. Check any carpeting to make sure that it is firmly attached to the stairs. Fix any carpet that is loose or worn. Avoid having throw rugs at the top or bottom of the  stairs. If you do have throw rugs, attach them to the floor with carpet tape. Make sure that you have a light switch at the top of the stairs and the bottom of the stairs. If you do not have them, ask someone to add them for you. What else can I do to help prevent falls? Wear shoes that: Do not have high heels. Have rubber bottoms. Are comfortable and fit you well. Are closed at the toe. Do not wear sandals. If you use a stepladder: Make sure that it is fully opened. Do not climb a closed stepladder. Make sure that both sides of the stepladder are locked into place. Ask someone to hold it for you, if possible. Clearly mark and make sure that you can see: Any grab bars or handrails. First and last  steps. Where the edge of each step is. Use tools that help you move around (mobility aids) if they are needed. These include: Canes. Walkers. Scooters. Crutches. Turn on the lights when you go into a dark area. Replace any light bulbs as soon as they burn out. Set up your furniture so you have a clear path. Avoid moving your furniture around. If any of your floors are uneven, fix them. If there are any pets around you, be aware of where they are. Review your medicines with your doctor. Some medicines can make you feel dizzy. This can increase your chance of falling. Ask your doctor what other things that you can do to help prevent falls. This information is not intended to replace advice given to you by your health care provider. Make sure you discuss any questions you have with your health care provider. Document Released: 04/17/2009 Document Revised: 11/27/2015 Document Reviewed: 07/26/2014 Elsevier Interactive Patient Education  2017 [employer].

## 2022-05-18 NOTE — Progress Notes (Signed)
Office Visit Note  Patient: Annette Suarez             Date of Birth: 07/08/1940           MRN: [id number]             PCP: [name], NP Referring: [name], NP Visit Date: 06/01/2022 Occupation: '@GUAROCC'$ @  Subjective:  Medication monitoring   History of Present Illness: Annette Suarez is a 81 y.o. female with history of seronegative rheumatoid arthritis and osteoarthritis.  She remains on Enbrel 50 mg sq injections once weekly.  She is tolerating Enbrel without any side effects and has not missed any doses recently.  She denies any signs or symptoms of a rheumatoid arthritis flare.  She continues to experience intermittent aching in both knee joints but denies any joint swelling.  She did not notice any improvement in her knee joint pain after having Visco gel injections performed in the right knee in April 2023.  She continues to experience leg cramps at night intermittently.  She has tried taking magnesium once daily with minimal relief. She denies any recent or recurrent infections.  She received the annual flu shot and COVID-19 booster.     Activities of Daily Living:  Patient reports morning stiffness for 0  none .   Patient Denies nocturnal pain.  Difficulty dressing/grooming: Denies Difficulty climbing stairs: Denies Difficulty getting out of chair: Denies Difficulty using hands for taps, buttons, cutlery, and/or writing: Denies  Review of Systems  Constitutional:  Positive for fatigue.  HENT:  Negative for mouth sores and mouth dryness.   Eyes:  Positive for dryness.  Respiratory:  Negative for shortness of breath.   Cardiovascular:  Negative for chest pain and palpitations.  Gastrointestinal:  Negative for blood in stool, constipation and diarrhea.  Endocrine: Positive for increased urination.  Genitourinary:  Positive for involuntary urination.  Musculoskeletal:  Positive for joint pain, gait problem and joint pain. Negative for joint swelling, myalgias, muscle  weakness, morning stiffness, muscle tenderness and myalgias.  Skin:  Negative for color change, rash, hair loss and sensitivity to sunlight.  Allergic/Immunologic: Negative for susceptible to infections.  Neurological:  Positive for dizziness. Negative for headaches.  Hematological:  Negative for swollen glands.  Psychiatric/Behavioral:  Positive for sleep disturbance. Negative for depressed mood. The patient is nervous/anxious.     PMFS History:  Patient Active Problem List   Diagnosis Date Noted   Coronary artery disease of native artery of native heart with stable angina pectoris ([place]) 01/27/2022   Hyperlipidemia associated with type 2 diabetes mellitus ([place]) 01/27/2022   Diabetes mellitus with coincident hypertension ([place]) 08/13/2021   Type 2 diabetes mellitus with stage 3a chronic kidney disease, with long-term current use of insulin ([place]) 08/13/2021   Multinodular goiter 08/13/2021   Subclavian arterial stenosis ([place]) 09/11/2020   Aortic atherosclerosis ([place]) 09/11/2020   Alteration in blood pressure 06/11/2020   Old complex tear of medial meniscus of right knee    Old complex tear of lateral meniscus of right knee    Primary osteoarthritis of both feet 12/13/2017   Dehydration 09/09/2017   Acute lower UTI 09/09/2017   Nausea & vomiting 09/08/2017   Primary osteoarthritis of both hands 04/05/2017   Left carpal tunnel syndrome 11/15/2016   Rheumatoid arthritis of multiple sites with negative rheumatoid factor ([place]) 10/06/2016   High risk medication use 10/06/2016   Dyslipidemia 10/06/2016   Pain in both hands 09/08/2016   Hypertension associated  with diabetes ([place]) 09/08/2016   Type 2 diabetes mellitus  09/08/2016   Gastroesophageal reflux disease without esophagitis 09/08/2016   Osteopenia of multiple sites 09/08/2016   History of anxiety 09/08/2016   Benign paroxysmal positional vertigo 09/08/2016   Sleep apnea 09/08/2016    Past Medical History:  Diagnosis Date    Acute cystitis    UTI   Anxiety    Arthritis    Benign positional vertigo    Dermatitis    Diabetes mellitus without complication (HCC)    Family history of adverse reaction to anesthesia    GERD (gastroesophageal reflux disease)    Goiter, nontoxic, multinodular    Gout    H/O: hysterectomy    History of tobacco use    Hx laparoscopic cholecystectomy    Hypercholesterolemia    Hypertension    Incomplete bladder emptying    Insomnia    Nausea & vomiting 09/08/2017   Postmenopausal atrophic vaginitis    RA (rheumatoid arthritis) (HCC)    Rectocele    Renal calculus    Skin cancer    Sleep apnea    Thyroid condition    Type 2 diabetes mellitus ([place])    Urolithiasis    Vaginal enterocele     Family History  Problem Relation Age of Onset   Diabetes Mother    Heart disease Father 62   Diabetes Son    Crohn's disease Son    Past Surgical History:  Procedure Laterality Date   CARPAL TUNNEL RELEASE  2018   CATARACT EXTRACTION, BILATERAL     CHOLECYSTECTOMY     COLONOSCOPY     07/11/2009, 02/2015   GALLBLADDER SURGERY     KNEE ARTHROSCOPY Right 05/20/2020   Procedure: RIGHT KNEE ARTHROSCOPY AND DEBRIDEMENT;  Surgeon: [name], MD;  Location: [facility];  Service: Orthopedics;  Laterality: Right;   PARTIAL HYSTERECTOMY     [place] left intact   SQUAMOUS CELL CARCINOMA EXCISION  05/2018   Social History   Social History Narrative   Diet: Blank      Do you drink/ eat things with caffeine? Yes      Marital status:  Married                             What year were you married ? 196      Do you live in a house, apartment,assistred living, condo, trailer, etc.)? House      Is it one or more stories? 1      How many persons live in your home ? 2      Do you have any pets in your home ?(please list)  1 Sam-Dog      Highest Level of education completed: 12      Current or past profession: Office      Do you exercise?   Some                            Type & how often  Walk Daily      ADVANCED DIRECTIVES (Please bring copies)      Do you have a living will? Blank      Do you have a DNR form?  Blank                     If not, do you want to discuss one?  Do you have signed POA?HPOA forms?  Yes               If so, please bring to your appointment      FUNCTIONAL STATUS- To be completed by Spouse / child / Staff       Do you have difficulty bathing or dressing yourself ?  No      Do you have difficulty preparing food or eating ?  No      Do you have difficulty managing your mediation ?  No      Do you have difficulty managing your finances ?  No      Do you have difficulty affording your medication ?  Yes (Some)      Immunization History  Administered Date(s) Administered   Fluad Quad(high Dose 65+) 04/09/2021   Influenza Split 03/28/2008, 03/05/2010, 04/17/2012, 04/20/2013   Influenza, High Dose Seasonal PF 02/08/2020, 05/04/2022   Influenza-Unspecified 05/11/2011, 04/22/2014, 04/15/2015, 02/20/2016, 03/02/2017, 03/09/2018, 03/20/2019, 03/21/2020   PFIZER(Purple Top)SARS-COV-2 Vaccination 07/24/2019, 08/06/2019, 08/27/2019, 04/21/2020   Pneumococcal Conjugate-13 05/02/2014   Pneumococcal Polysaccharide-23 04/23/2008   Tdap 05/01/2013   Zoster, Live 04/17/2012     Objective: Vital Signs: BP (!) 170/80 (BP Location: Left Arm, Patient Position: Sitting, Cuff Size: Normal)   Pulse 90   Resp 15   Ht '5\' 1"'$  (1.549 m)   Wt 157 lb (71.2 kg)   BMI 29.66 kg/m    Physical Exam Vitals and nursing note reviewed.  Constitutional:      Appearance: She is well-developed.  HENT:     Head: Normocephalic and atraumatic.  Eyes:     Conjunctiva/sclera: Conjunctivae normal.  Cardiovascular:     Rate and Rhythm: Normal rate and regular rhythm.     Heart sounds: Normal heart sounds.  Pulmonary:     Effort: Pulmonary effort is normal.     Breath sounds: Normal breath sounds.  Abdominal:     General: Bowel sounds are  normal.     Palpations: Abdomen is soft.  Musculoskeletal:     Cervical back: Normal range of motion.  Skin:    General: Skin is warm and dry.     Capillary Refill: Capillary refill takes less than 2 seconds.  Neurological:     Mental Status: She is alert and oriented to person, place, and time.  Psychiatric:        Behavior: Behavior normal.      Musculoskeletal Exam: C-spine has limited range of motion.  Thoracic kyphosis noted.  No midline spinal tenderness or SI joint tenderness.  Shoulder joints, elbow joints, wrist joints, MCPs, PIPs, DIPs have good range of motion with no synovitis.  PIP and DIP thickening consistent with osteoarthritis of both hands.  CMC joint prominence noted bilaterally.  Hip joints have good range of motion with no groin pain.  Limited extension of the right knee but no warmth or effusion noted.  Left knee joint has full range of motion with no warmth or effusion.  Ankle joints have good range of motion with no tenderness or joint swelling.  CDAI Exam: CDAI Score: -- Patient Global: 2 mm; Provider Global: 2 mm Swollen: --; Tender: -- Joint Exam 06/01/2022   No joint exam has been documented for this visit   There is currently no information documented on the homunculus. Go to the Rheumatology activity and complete the homunculus joint exam.  Investigation: No additional findings.  Imaging: No results found.  Recent Labs: Lab Results  Component Value  Date   WBC 9.1 04/01/2022   HGB 13.0 04/01/2022   PLT 289 04/01/2022   NA 140 04/01/2022   K 4.5 04/01/2022   CL 103 04/01/2022   CO2 29 04/01/2022   GLUCOSE 71 04/01/2022   BUN 25 04/01/2022   CREATININE 1.06 (H) 04/01/2022   BILITOT 1.2 03/29/2022   ALKPHOS 83 07/25/2019   AST 11 03/29/2022   ALT 10 03/29/2022   PROT 6.7 03/29/2022   ALBUMIN 4.6 07/25/2019   CALCIUM 9.4 04/01/2022   GFRAA 43 (L) 01/09/2021   QFTBGOLDPLUS NEGATIVE 07/29/2021    Speciality Comments: Recieves Enbrel  through [facility] 02/20/2019 Osteopenia managed by Dr. Annice Pih 02/20/2019  Procedures:  No procedures performed Allergies: Amaryl [glimepiride], Amlodipine, Codeine, Jardiance [empagliflozin], Magnesium citrate, Pneumovax 23 [pneumococcal vac polyvalent], Wellbutrin [bupropion], and Latex   Assessment / Plan:     Visit Diagnoses: Rheumatoid arthritis of multiple sites with negative rheumatoid factor ([place]): She has no synovitis on examination today.  She has not had any signs or symptoms of a rheumatoid arthritis flare.  She has clinically been doing well on Enbrel 50 mg subcutaneous injections once weekly.  She is tolerating Enbrel without any side effects or injection site reactions.  She experiences intermittent aching in both knee joints especially her right knee but has not noticed any joint swelling.  On examination she has good range of motion of both knee joints with no warmth or effusion.  She was advised to notify us if she develops signs or symptoms of a flare.  She will remain on Enbrel as monotherapy.   She will follow-up in the office in 5 months or sooner if needed.  High risk medication use - Enbrel 50 mg sq injections once weekly. (inadequate response to [place]). - Plan: QuantiFERON-TB Gold Plus CBC updated on 04/01/22. CMP drawn on 03/19/22.  She will return for lab work in December and every 3 months to monitor for drug toxicity.  Standing orders for CBC and CMP remain in place. TB gold negative on 07/29/21.  Future order placed today. She has not had any recent or recurrent infections.  Discussed the importance of holding enbrel if she develops signs or symptoms of an infection and to resume once the infection has completely cleared.  She received the annual flu shot and COVID-19 booster. Discussed the importance of skin cancer screening on yearly basis while on Enbrel.  Screening for tuberculosis -Future order for TB Gold placed today.  Plan: QuantiFERON-TB  Gold Plus  Primary osteoarthritis of both hands: PIP, DIP, and CMC joint thickening consistent with osteoarthritis of both hands noted.  No inflammation noted on examination today.  Trochanteric bursitis, right hip: Currently asymptomatic.  Primary osteoarthritis of right knee: Good range of motion of the right knee joint.  No warmth or effusion noted.  Primary osteoarthritis of both feet: She is not experiencing any discomfort in her feet at this time.  She wears proper fitting shoes.  DDD (degenerative disc disease), lumbar - X-rays from March 31, 2017 done by Dr. Kenton Kingfisher were reviewed which showed multilevel spondylosis and facet joint arthropathy.  No midline spinal tenderness.    Osteopenia of multiple sites: Order for DEXA is in place.  Patient plans on scheduling her mammogram and bone density at the same visit.  Other medical conditions are listed as follows:  Type 2 diabetes mellitus   History of hyperlipidemia  Essential hypertension: Blood pressure was elevated at 170/80 today in the office.  Blood  pressure was rechecked today.  Patient plans on rechecking her blood pressure when she is home.  She was advised to reach out to her PCP if her blood pressure remains elevated.  Gastroesophageal reflux disease without esophagitis  Nocturnal muscle cramps: Patient has tried taking magnesium in the past.  History of sleep apnea  History of anxiety  History of vertigo  Former smoker    Orders: Orders Placed This Encounter  Procedures   QuantiFERON-TB Gold Plus   No orders of the defined types were placed in this encounter.   Follow-Up Instructions: Return in about 5 months (around 10/31/2022) for Rheumatoid arthritis, Osteoarthritis.   Ofilia Neas, PA-C  Note - This record has been created using Dragon software.  Chart creation errors have been sought, but may not always  have been located. Such creation errors do not reflect on  the standard of medical care.

## 2022-06-01 ENCOUNTER — Ambulatory Visit: Payer: Medicare Other | Attending: Physician Assistant | Admitting: Physician Assistant

## 2022-06-01 ENCOUNTER — Encounter: Payer: Self-pay | Admitting: Physician Assistant

## 2022-06-01 VITALS — BP 170/80 | HR 90 | Resp 15 | Ht 61.0 in | Wt 157.0 lb

## 2022-06-01 DIAGNOSIS — M19071 Primary osteoarthritis, right ankle and foot: Secondary | ICD-10-CM | POA: Diagnosis not present

## 2022-06-01 DIAGNOSIS — Z79899 Other long term (current) drug therapy: Secondary | ICD-10-CM | POA: Diagnosis not present

## 2022-06-01 DIAGNOSIS — Z8659 Personal history of other mental and behavioral disorders: Secondary | ICD-10-CM

## 2022-06-01 DIAGNOSIS — M19041 Primary osteoarthritis, right hand: Secondary | ICD-10-CM

## 2022-06-01 DIAGNOSIS — M1711 Unilateral primary osteoarthritis, right knee: Secondary | ICD-10-CM | POA: Diagnosis not present

## 2022-06-01 DIAGNOSIS — M19042 Primary osteoarthritis, left hand: Secondary | ICD-10-CM

## 2022-06-01 DIAGNOSIS — K219 Gastro-esophageal reflux disease without esophagitis: Secondary | ICD-10-CM

## 2022-06-01 DIAGNOSIS — M7061 Trochanteric bursitis, right hip: Secondary | ICD-10-CM

## 2022-06-01 DIAGNOSIS — I10 Essential (primary) hypertension: Secondary | ICD-10-CM

## 2022-06-01 DIAGNOSIS — Z87891 Personal history of nicotine dependence: Secondary | ICD-10-CM

## 2022-06-01 DIAGNOSIS — E118 Type 2 diabetes mellitus with unspecified complications: Secondary | ICD-10-CM | POA: Diagnosis not present

## 2022-06-01 DIAGNOSIS — Z8669 Personal history of other diseases of the nervous system and sense organs: Secondary | ICD-10-CM

## 2022-06-01 DIAGNOSIS — M0609 Rheumatoid arthritis without rheumatoid factor, multiple sites: Secondary | ICD-10-CM | POA: Diagnosis not present

## 2022-06-01 DIAGNOSIS — Z87898 Personal history of other specified conditions: Secondary | ICD-10-CM

## 2022-06-01 DIAGNOSIS — M51369 Other intervertebral disc degeneration, lumbar region without mention of lumbar back pain or lower extremity pain: Secondary | ICD-10-CM

## 2022-06-01 DIAGNOSIS — Z8639 Personal history of other endocrine, nutritional and metabolic disease: Secondary | ICD-10-CM

## 2022-06-01 DIAGNOSIS — Z111 Encounter for screening for respiratory tuberculosis: Secondary | ICD-10-CM

## 2022-06-01 DIAGNOSIS — Z794 Long term (current) use of insulin: Secondary | ICD-10-CM

## 2022-06-01 DIAGNOSIS — M5136 Other intervertebral disc degeneration, lumbar region: Secondary | ICD-10-CM

## 2022-06-01 DIAGNOSIS — M8589 Other specified disorders of bone density and structure, multiple sites: Secondary | ICD-10-CM

## 2022-06-01 DIAGNOSIS — M19072 Primary osteoarthritis, left ankle and foot: Secondary | ICD-10-CM

## 2022-06-01 DIAGNOSIS — R252 Cramp and spasm: Secondary | ICD-10-CM

## 2022-06-01 NOTE — Patient Instructions (Signed)
Standing Labs We placed an order today for your standing lab work.   Please have your standing labs drawn in December and every 3 months   Please have your labs drawn 2 weeks prior to your appointment so that the provider can discuss your lab results at your appointment.  Please note that you may see your imaging and lab results in [facility] before we have reviewed them. We will contact you once all results are reviewed. Please allow our office up to 72 hours to thoroughly review all of the results before contacting the office for clarification of your results.  Lab hours are:   Monday through Thursday from 8:00 am -12:30 pm and 1:00 pm-5:00 pm and Friday from 8:00 am-12:00 pm.  Please be advised, all patients with office appointments requiring lab work will take precedent over walk-in lab work.   Labs are drawn by [facility]. Please bring your co-pay at the time of your lab draw.  You may receive a bill from [facility] for your lab work.  Please note if you are on Hydroxychloroquine and and an order has been placed for a Hydroxychloroquine level, you will need to have it drawn 4 hours or more after your last dose.  If you wish to have your labs drawn at another location, please call the office 24 hours in advance so we can fax the orders.  The office is located at [address], [place], [place],  [address] No appointment is necessary.    If you have any questions regarding directions or hours of operation,  please call [phone].   As a reminder, please drink plenty of water prior to coming for your lab work. Thanks!

## 2022-06-02 ENCOUNTER — Telehealth: Payer: Self-pay

## 2022-06-02 NOTE — Telephone Encounter (Signed)
Received completed patient portion of ENBREL PAP renewal. Provider portion placed in [facility] folder for signature.   [place] [facility] PharmD Candidate [phone]

## 2022-06-08 ENCOUNTER — Telehealth: Payer: Self-pay

## 2022-06-08 NOTE — Telephone Encounter (Signed)
Received notification from [facility] regarding a prior authorization for ENBREL. Authorization has been APPROVED from 06/08/22 to 07/05/2023. Approval letter sent to scan center.  Authorization # [id number] Phone # [phone]  Submitted Patient Assistance RENEWAL Application to [employer] for ENBREL along with provider portion, patient portion, med list, insurance card copy, PA and income documents. Will update patient when we receive a response.  Fax# [phone] Phone# [phone]  Knox Saliva, PharmD, MPH, BCPS, CPP Clinical Pharmacist (Rheumatology and Pulmonology)

## 2022-06-08 NOTE — Telephone Encounter (Signed)
Signed provider form received.   Submitted a Prior Authorization renewal request to [facility] for ENBREL via CoverMyMeds. Will update once we receive a response.  Key: [id number]  Knox Saliva, PharmD, MPH, BCPS, CPP Clinical Pharmacist (Rheumatology and Pulmonology)

## 2022-06-08 NOTE — Telephone Encounter (Signed)
Patient advised that [employer]  patient assistance is ready for pick up. Ozempic 5 boxes.

## 2022-06-10 NOTE — Telephone Encounter (Signed)
Patient picked up Ozempic patient assistance - log noted

## 2022-06-24 ENCOUNTER — Other Ambulatory Visit: Payer: Self-pay | Admitting: *Deleted

## 2022-06-24 DIAGNOSIS — Z111 Encounter for screening for respiratory tuberculosis: Secondary | ICD-10-CM

## 2022-06-24 DIAGNOSIS — Z79899 Other long term (current) drug therapy: Secondary | ICD-10-CM

## 2022-06-25 ENCOUNTER — Other Ambulatory Visit: Payer: Self-pay | Admitting: Orthopedic Surgery

## 2022-06-25 DIAGNOSIS — E118 Type 2 diabetes mellitus with unspecified complications: Secondary | ICD-10-CM

## 2022-06-25 LAB — COMPLETE METABOLIC PANEL WITH GFR
AG Ratio: 1.8 (calc) (ref 1.0–2.5)
ALT: 9 U/L (ref 6–29)
AST: 10 U/L (ref 10–35)
Albumin: 4.2 g/dL (ref 3.6–5.1)
Alkaline phosphatase (APISO): 97 U/L (ref 37–153)
BUN/Creatinine Ratio: 17 (calc) (ref 6–22)
BUN: 21 mg/dL (ref 7–25)
CO2: 28 mmol/L (ref 20–32)
Calcium: 9.3 mg/dL (ref 8.6–10.4)
Chloride: 101 mmol/L (ref 98–110)
Creat: 1.26 mg/dL — ABNORMAL HIGH (ref 0.60–1.00)
Globulin: 2.3 g/dL (calc) (ref 1.9–3.7)
Glucose, Bld: 198 mg/dL — ABNORMAL HIGH (ref 65–99)
Potassium: 4.8 mmol/L (ref 3.5–5.3)
Sodium: 138 mmol/L (ref 135–146)
Total Bilirubin: 1 mg/dL (ref 0.2–1.2)
Total Protein: 6.5 g/dL (ref 6.1–8.1)
eGFR: 43 mL/min/{1.73_m2} — ABNORMAL LOW (ref >=60)

## 2022-06-25 LAB — QUANTIFERON-TB GOLD PLUS
Mitogen-NIL: >10 IU/mL
NIL: 0.02 IU/mL
QuantiFERON-TB Gold Plus: NEGATIVE
TB1-NIL: 0 IU/mL
TB2-NIL: 0 IU/mL

## 2022-06-25 LAB — CBC WITH DIFFERENTIAL/PLATELET
Absolute Monocytes: 721 cells/uL (ref 200–950)
Basophils Absolute: 82 cells/uL (ref 0–200)
Basophils Relative: 0.8 %
Eosinophils Absolute: 227 cells/uL (ref 15–500)
Eosinophils Relative: 2.2 %
HCT: 37.3 % (ref 35.0–45.0)
Hemoglobin: 12.8 g/dL (ref 11.7–15.5)
Lymphs Abs: 2184 cells/uL (ref 850–3900)
MCH: 32 pg (ref 27.0–33.0)
MCHC: 34.3 g/dL (ref 32.0–36.0)
MCV: 93.3 fL (ref 80.0–100.0)
MPV: 11.4 fL (ref 7.5–12.5)
Monocytes Relative: 7 %
Neutro Abs: 7086 cells/uL (ref 1500–7800)
Neutrophils Relative %: 68.8 %
Platelets: 308 10*3/uL (ref 140–400)
RBC: 4 10*6/uL (ref 3.80–5.10)
RDW: 11.8 % (ref 11.0–15.0)
Total Lymphocyte: 21.2 %
WBC: 10.3 10*3/uL (ref 3.8–10.8)

## 2022-06-25 NOTE — Progress Notes (Signed)
CBC is normal.  Glucose is elevated at 198.  Creatinine is elevated and is stable.  Please forward results to her PCP.

## 2022-06-30 NOTE — Progress Notes (Signed)
TB gold negative

## 2022-07-02 NOTE — Telephone Encounter (Signed)
[place] for update on patient's Enbrel renewal application. Pre rep, her renewal application was attached to active 2023 case for some reason. Rep has moved cased to review stage  Phone# [phone]  Knox Saliva, PharmD, MPH, BCPS, CPP Clinical Pharmacist (Rheumatology and Pulmonology)

## 2022-07-07 ENCOUNTER — Other Ambulatory Visit: Payer: Self-pay

## 2022-07-07 ENCOUNTER — Other Ambulatory Visit: Payer: Medicare Other

## 2022-07-07 DIAGNOSIS — E118 Type 2 diabetes mellitus with unspecified complications: Secondary | ICD-10-CM | POA: Diagnosis not present

## 2022-07-07 DIAGNOSIS — Z794 Long term (current) use of insulin: Secondary | ICD-10-CM | POA: Diagnosis not present

## 2022-07-07 NOTE — Telephone Encounter (Signed)
Received a fax from  [place] regarding an approval for ENBREL patient assistance from 07/06/2022 to 07/05/2023. Approval letter sent to scan center.  Phone number: [phone]  Knox Saliva, PharmD, MPH, BCPS, CPP Clinical Pharmacist (Rheumatology and Pulmonology)

## 2022-07-08 DIAGNOSIS — H53143 Visual discomfort, bilateral: Secondary | ICD-10-CM | POA: Diagnosis not present

## 2022-07-08 DIAGNOSIS — E119 Type 2 diabetes mellitus without complications: Secondary | ICD-10-CM | POA: Diagnosis not present

## 2022-07-08 DIAGNOSIS — H35363 Drusen (degenerative) of macula, bilateral: Secondary | ICD-10-CM | POA: Diagnosis not present

## 2022-07-08 LAB — HEMOGLOBIN A1C
Hgb A1c MFr Bld: 8.6 % of total Hgb — ABNORMAL HIGH (ref <5.7)
Mean Plasma Glucose: 200 mg/dL
eAG (mmol/L): 11.1 mmol/L

## 2022-07-12 DIAGNOSIS — Z1231 Encounter for screening mammogram for malignant neoplasm of breast: Secondary | ICD-10-CM | POA: Diagnosis not present

## 2022-07-12 LAB — HM MAMMOGRAPHY

## 2022-07-20 ENCOUNTER — Ambulatory Visit (INDEPENDENT_AMBULATORY_CARE_PROVIDER_SITE_OTHER): Payer: Medicare Other | Admitting: Family

## 2022-07-20 ENCOUNTER — Encounter: Payer: Self-pay | Admitting: Family

## 2022-07-20 VITALS — BP 118/60 | HR 87 | Temp 96.2°F | Resp 17 | Ht 61.0 in | Wt 159.2 lb

## 2022-07-20 DIAGNOSIS — R8290 Unspecified abnormal findings in urine: Secondary | ICD-10-CM | POA: Diagnosis not present

## 2022-07-20 DIAGNOSIS — R300 Dysuria: Secondary | ICD-10-CM | POA: Diagnosis not present

## 2022-07-20 DIAGNOSIS — N898 Other specified noninflammatory disorders of vagina: Secondary | ICD-10-CM | POA: Diagnosis not present

## 2022-07-20 DIAGNOSIS — R350 Frequency of micturition: Secondary | ICD-10-CM

## 2022-07-20 LAB — POCT URINALYSIS DIPSTICK
Bilirubin, UA: NEGATIVE
Glucose, UA: NEGATIVE
Nitrite, UA: POSITIVE
Protein, UA: NEGATIVE
Spec Grav, UA: 1.015 (ref 1.010–1.025)
Urobilinogen, UA: NEGATIVE E.U./dL — AB
pH, UA: 5 (ref 5.0–8.0)

## 2022-07-20 MED ORDER — CIPROFLOXACIN HCL 500 MG PO TABS: 500.0000 mg | ORAL_TABLET | Freq: Two times a day (BID) | ORAL | 0 refills | Status: AC

## 2022-07-20 MED ORDER — ESTRADIOL 0.1 MG/GM VA CREA: 1.0000 | TOPICAL_CREAM | VAGINAL | 0 refills | Status: AC

## 2022-07-20 NOTE — Progress Notes (Signed)
Provider: Munir Victorian FNP-C  [name], NP  Patient Care Team: [name], NP as PCP - General (Adult Health Nurse Practitioner) Werner Lean, MD as PCP - Cardiology (Cardiology)  Extended Emergency Contact Information Primary Emergency Contact: Jeanpaul,Donnie Address: [address]          [place], [place] [address] Johnnette Litter of [place] Phone: [phone] Mobile Phone: [phone] Relation: Spouse Secondary Emergency Contact: [place], [place] Phone: [phone] Mobile Phone: [phone] Relation: Other  Code Status:  Full Code  Goals of care: Advanced Directive information    07/20/2022    2:25 PM  Advanced Directives  Does Patient Have a Medical Advance Directive? No  Would patient like information on creating a medical advance directive? No - Patient declined     Chief Complaint  Patient presents with   Acute Visit    Patient complains of possible UTI. Symptoms are urinary frequency, dysuria, itching, and odor.     HPI:  Pt is a 82 y.o. female seen today for an acute visit for evaluation of urine frequency,dysuria and itching x 3 days.gets up several times at night but does nit empty the bladder completely.Has seen a urologist in the past.  Blood sugars have been in the 400's x 2 times since she had symptoms.  denies any fever,chills,nausea,vomiting,abdominal pain,flank pain,urgency,difficult urination or hematuria. Drinks three 16 oz bottles of water daily.    Past Medical History:  Diagnosis Date   Acute cystitis    UTI   Anxiety    Arthritis    Benign positional vertigo    Dermatitis    Diabetes mellitus without complication ([place])    Family history of adverse reaction to anesthesia    GERD (gastroesophageal reflux disease)    Goiter, nontoxic, multinodular    Gout    H/O: hysterectomy    History of tobacco use    Hx laparoscopic cholecystectomy    Hypercholesterolemia    Hypertension    Incomplete bladder emptying     Insomnia    Nausea & vomiting 09/08/2017   Postmenopausal atrophic vaginitis    RA (rheumatoid arthritis) ([place])    Rectocele    Renal calculus    Skin cancer    Sleep apnea    Thyroid condition    Type 2 diabetes mellitus ([place])    Urolithiasis    Vaginal enterocele    Past Surgical History:  Procedure Laterality Date   CARPAL TUNNEL RELEASE  2018   CATARACT EXTRACTION, BILATERAL     CHOLECYSTECTOMY     COLONOSCOPY     07/11/2009, 02/2015   GALLBLADDER SURGERY     KNEE ARTHROSCOPY Right 05/20/2020   Procedure: RIGHT KNEE ARTHROSCOPY AND DEBRIDEMENT;  Surgeon: [name], MD;  Location: [facility];  Service: Orthopedics;  Laterality: Right;   PARTIAL HYSTERECTOMY     Overies left intact   SQUAMOUS CELL CARCINOMA EXCISION  05/2018    Allergies  Allergen Reactions   Amaryl [Glimepiride]     Don't Remember   Amlodipine     LE edema on 7.'5mg'$  daily. Tolerates '5mg'$  daily just fine   Codeine     SICK   Jardiance [Empagliflozin]     Urinary tract infection- SIDE EFFECT   Magnesium Citrate     Strong urine odor and dysuria   Pneumovax 23 [Pneumococcal Vac Polyvalent]     Red / Swelling / Hot to touch SIDE EFFECTS   Wellbutrin [Bupropion]     Unknown  Latex Rash    Outpatient Encounter Medications as of 07/20/2022  Medication Sig   Accu-Chek Softclix Lancets lancets Use to test blood sugar three times daily. Dx: E11.8   aspirin EC 81 MG tablet Take 81 mg by mouth daily.   Biotin 1 MG CAPS Take by mouth.   carvedilol (COREG) 6.25 MG tablet Take 1 tablet (6.25 mg total) by mouth 2 (two) times daily.   chlorthalidone (HYGROTON) 25 MG tablet Take 1 tablet (25 mg total) by mouth daily.   Cholecalciferol (VITAMIN D3) 50 MCG (2000 UT) capsule Take 2,000 Units by mouth daily.   Continuous Blood Gluc Receiver (FREESTYLE LIBRE 2 READER) DEVI    Continuous Blood Gluc Sensor (FREESTYLE LIBRE 2 SENSOR) MISC USE TO CHECK BLOOD SUGAR **CHANGE EVERY 14 DAYS**   Diclofenac  Sodium (VOLTAREN EX) Apply 1 application topically 3 (three) times daily as needed.   estradiol (ESTRACE) 0.1 MG/GM vaginal cream Place 1 Applicatorful vaginally 3 (three) times a week.   etanercept (ENBREL SURECLICK) 50 MG/ML injection Inject 50 mg into the skin once a week.   fluocinonide (LIDEX) 0.05 % external solution Apply 1 Application topically daily as needed.   insulin degludec (TRESIBA FLEXTOUCH) 200 UNIT/ML FlexTouch Pen Inject 64 Units into the skin daily in the afternoon.   Insulin Pen Needle 31G X 5 MM MISC 1 Device by Does not apply route daily in the afternoon.   irbesartan (AVAPRO) 300 MG tablet TAKE ONE TABLET BY MOUTH DAILY   loperamide (IMODIUM A-D) 2 MG tablet Take 2 mg by mouth as needed for diarrhea or loose stools.   LORazepam (ATIVAN) 0.5 MG tablet TAKE ONE TABLET BY MOUTH EVERY NIGHT AT BEDTIME   MELATONIN PO Take 10 mg by mouth at bedtime as needed.   nitroGLYCERIN (NITROSTAT) 0.4 MG SL tablet Place 1 tablet (0.4 mg total) under the tongue every 5 (five) minutes as needed for chest pain.   Omeprazole 20 MG TBDD Take 20 mg by mouth daily.   ondansetron (ZOFRAN) 4 MG tablet Take 1 tablet (4 mg total) by mouth 3 (three) times daily as needed for nausea or vomiting.   Probiotic Product (PROBIOTIC PO) Take by mouth daily.   Semaglutide,0.25 or 0.'5MG'$ /DOS, (OZEMPIC, 0.25 OR 0.5 MG/DOSE,) 2 MG/1.5ML SOPN Inject 0.5 mg into the skin once a week.   simvastatin (ZOCOR) 20 MG tablet TAKE ONE TABLET BY MOUTH EVERY EVENING   No facility-administered encounter medications on file as of 07/20/2022.    Review of Systems  Constitutional:  Negative for appetite change, chills, fatigue, fever and unexpected weight change.  Eyes:  Negative for pain, discharge, redness, itching and visual disturbance.  Respiratory:  Negative for cough, chest tightness, shortness of breath and wheezing.   Cardiovascular:  Negative for chest pain, palpitations and leg swelling.  Gastrointestinal:   Negative for abdominal distention, nausea and vomiting.       Had bilateral lower abdominal pain yesterday but none today   Endocrine: Negative for polydipsia, polyphagia and polyuria.  Genitourinary:  Positive for dysuria, frequency and urgency. Negative for difficulty urinating and flank pain.  Musculoskeletal:  Negative for arthralgias, back pain and gait problem.  Skin:  Negative for color change, pallor and rash.  Psychiatric/Behavioral:  Negative for agitation, behavioral problems, confusion, hallucinations and sleep disturbance. The patient is not nervous/anxious.     Immunization History  Administered Date(s) Administered   Fluad Quad(high Dose 65+) 04/09/2021   Influenza Split 03/28/2008, 03/05/2010, 04/17/2012, 04/20/2013   Influenza, High  Dose Seasonal PF 02/08/2020, 05/04/2022   Influenza-Unspecified 05/11/2011, 04/22/2014, 04/15/2015, 02/20/2016, 03/02/2017, 03/09/2018, 03/20/2019, 03/21/2020   PFIZER(Purple Top)SARS-COV-2 Vaccination 07/24/2019, 08/06/2019, 08/27/2019, 04/21/2020   Pneumococcal Conjugate-13 05/02/2014   Pneumococcal Polysaccharide-23 04/23/2008   Tdap 05/01/2013   Zoster, Live 04/17/2012   Pertinent  Health Maintenance Due  Topic Date Due   FOOT EXAM  05/11/2022   OPHTHALMOLOGY EXAM  08/04/2022 (Originally 06/04/2021)   HEMOGLOBIN A1C  01/05/2023   INFLUENZA VACCINE  Completed   DEXA SCAN  Completed      10/05/2021   10:07 AM 01/14/2022    8:38 AM 04/01/2022    9:07 AM 05/11/2022    9:37 AM 07/20/2022    2:25 PM  Fall Risk  Falls in the past year? 0 0 0 0 0  Was there an injury with Fall?  0 0 0 0  Fall Risk Category Calculator  0 0 0 0  Fall Risk Category (Retired)  Low Low Low   (RETIRED) Patient Fall Risk Level  Low fall risk Low fall risk Low fall risk   Patient at Risk for Falls Due to  No Fall Risks No Fall Risks No Fall Risks No Fall Risks  Fall risk Follow up  Falls evaluation completed Falls evaluation completed Falls evaluation completed  Falls evaluation completed   Functional Status Survey:    Vitals:   07/20/22 1423  BP: 118/60  Pulse: 87  Resp: 17  Temp: (!) 96.2 F (35.7 C)  SpO2: 94%  Weight: 159 lb 3.2 oz (72.2 kg)  Height: '5\' 1"'$  (1.549 m)   Body mass index is 30.08 kg/m. Physical Exam Vitals reviewed.  Constitutional:      General: She is not in acute distress.    Appearance: Normal appearance. She is normal weight. She is not ill-appearing or diaphoretic.  HENT:     Head: Normocephalic.     Mouth/Throat:     Mouth: Mucous membranes are moist.     Pharynx: Oropharynx is clear. No oropharyngeal exudate or posterior oropharyngeal erythema.  Eyes:     General: No scleral icterus.       Right eye: No discharge.        Left eye: No discharge.     Conjunctiva/sclera: Conjunctivae normal.     Pupils: Pupils are equal, round, and reactive to light.  Cardiovascular:     Rate and Rhythm: Normal rate and regular rhythm.     Pulses: Normal pulses.     Heart sounds: Normal heart sounds. No murmur heard.    No friction rub. No gallop.  Pulmonary:     Effort: Pulmonary effort is normal. No respiratory distress.     Breath sounds: Normal breath sounds. No wheezing, rhonchi or rales.  Chest:     Chest wall: No tenderness.  Abdominal:     General: Bowel sounds are normal. There is no distension.     Palpations: Abdomen is soft. There is no mass.     Tenderness: There is no abdominal tenderness. There is no right CVA tenderness, left CVA tenderness, guarding or rebound.  Skin:    General: Skin is warm and dry.     Coloration: Skin is not pale.     Findings: No erythema or rash.  Neurological:     Mental Status: She is alert and oriented to person, place, and time.     Motor: No weakness.     Gait: Gait normal.  Psychiatric:        Mood and  Affect: Mood normal.        Speech: Speech normal.        Behavior: Behavior normal.     Labs reviewed: Recent Labs    03/29/22 1455 04/01/22 0931  06/24/22 1133  NA 138 140 138  K 4.7 4.5 4.8  CL 101 103 101  CO2 '30 29 28  '$ GLUCOSE 147* 71 198*  BUN 26* 25 21  CREATININE 1.25* 1.06* 1.26*  CALCIUM 8.8 9.4 9.3   Recent Labs    12/30/21 1109 03/29/22 1455 06/24/22 1133  AST '12 11 10  '$ ALT '11 10 9  '$ BILITOT 1.4* 1.2 1.0  PROT 6.7 6.7 6.5   Recent Labs    03/29/22 1455 04/01/22 0931 06/24/22 1133  WBC 11.4* 9.1 10.3  NEUTROABS 7,661 5,997 7,086  HGB 12.9 13.0 12.8  HCT 37.1 37.8 37.3  MCV 92.8 91.5 93.3  PLT 302 289 308   Lab Results  Component Value Date   TSH 1.81 04/09/2021   Lab Results  Component Value Date   HGBA1C 8.6 (H) 07/07/2022   Lab Results  Component Value Date   CHOL 112 08/06/2021   HDL 39 (L) 08/06/2021   LDLCALC 50 08/06/2021   TRIG 146 08/06/2021   CHOLHDL 2.9 08/06/2021    Significant Diagnostic Results in last 30 days:  No results found.  Assessment/Plan 1. Vaginal dryness Request refill for estradiol  - POC Urinalysis Dipstick - Urine Culture  2. Dysuria Afebrile  - POC Urinalysis Dipstick Indicates yellow,cloudy urine positive for moderate blood,nitrites and leukocytes.Start on Cipro 500 mg tablet one by mouth twice daily x 7 days.aware might need to switch antibiotics if cultures indicates a different sensitivity. Continue on probiotics Florastor  - Urine Culture  3. Urinary frequency Suspect due to UTI as above results  - POC Urinalysis Dipstick - Urine Culture  4. Abnormal urine odor Encouraged to increase water intake to 6-8 glasses daily.  - POC Urinalysis Dipstick - Urine Culture  5. Vaginal itching Refill estradiol   Family/ staff Communication: Reviewed plan of care with patient verbalized understanding   Labs/tests ordered:  - POC Urinalysis Dipstick - Urine Culture  Next Appointment: Return if symptoms worsen or fail to improve.   Sandrea Hughs, NP

## 2022-07-20 NOTE — Patient Instructions (Signed)
Urinary Tract Infection, Adult ?A urinary tract infection (UTI) is an infection of any part of the urinary tract. The urinary tract includes: ?The kidneys. ?The ureters. ?The bladder. ?The urethra. ?These organs make, store, and get rid of pee (urine) in the body. ?What are the causes? ?This infection is caused by germs (bacteria) in your genital area. These germs grow and cause swelling (inflammation) of your urinary tract. ?What increases the risk? ?The following factors may make you more likely to develop this condition: ?Using a small, thin tube (catheter) to drain pee. ?Not being able to control when you pee or poop (incontinence). ?Being female. If you are female, these things can increase the risk: ?Using these methods to prevent pregnancy: ?A medicine that kills sperm (spermicide). ?A device that blocks sperm (diaphragm). ?Having low levels of a female hormone (estrogen). ?Being pregnant. ?You are more likely to develop this condition if: ?You have genes that add to your risk. ?You are sexually active. ?You take antibiotic medicines. ?You have trouble peeing because of: ?A prostate that is bigger than normal, if you are female. ?A blockage in the part of your body that drains pee from the bladder. ?A kidney stone. ?A nerve condition that affects your bladder. ?Not getting enough to drink. ?Not peeing often enough. ?You have other conditions, such as: ?Diabetes. ?A weak disease-fighting system (immune system). ?Sickle cell disease. ?Gout. ?Injury of the spine. ?What are the signs or symptoms? ?Symptoms of this condition include: ?Needing to pee right away. ?Peeing small amounts often. ?Pain or burning when peeing. ?Blood in the pee. ?Pee that smells bad or not like normal. ?Trouble peeing. ?Pee that is cloudy. ?Fluid coming from the vagina, if you are female. ?Pain in the belly or lower back. ?Other symptoms include: ?Vomiting. ?Not feeling hungry. ?Feeling mixed up (confused). This may be the first symptom in  older adults. ?Being tired and grouchy (irritable). ?A fever. ?Watery poop (diarrhea). ?How is this treated? ?Taking antibiotic medicine. ?Taking other medicines. ?Drinking enough water. ?In some cases, you may need to see a specialist. ?Follow these instructions at home: ? ?Medicines ?Take over-the-counter and prescription medicines only as told by your doctor. ?If you were prescribed an antibiotic medicine, take it as told by your doctor. Do not stop taking it even if you start to feel better. ?General instructions ?Make sure you: ?Pee until your bladder is empty. ?Do not hold pee for a long time. ?Empty your bladder after sex. ?Wipe from front to back after peeing or pooping if you are a female. Use each tissue one time when you wipe. ?Drink enough fluid to keep your pee pale yellow. ?Keep all follow-up visits. ?Contact a doctor if: ?You do not get better after 1-2 days. ?Your symptoms go away and then come back. ?Get help right away if: ?You have very bad back pain. ?You have very bad pain in your lower belly. ?You have a fever. ?You have chills. ?You feeling like you will vomit or you vomit. ?Summary ?A urinary tract infection (UTI) is an infection of any part of the urinary tract. ?This condition is caused by germs in your genital area. ?There are many risk factors for a UTI. ?Treatment includes antibiotic medicines. ?Drink enough fluid to keep your pee pale yellow. ?This information is not intended to replace advice given to you by your health care provider. Make sure you discuss any questions you have with your health care provider. ?Document Revised: 02/01/2020 Document Reviewed: 02/01/2020 ?Elsevier Patient Education ?   [place].

## 2022-07-22 LAB — URINE CULTURE
MICRO NUMBER:: 14435416
SPECIMEN QUALITY:: ADEQUATE

## 2022-08-04 DIAGNOSIS — L570 Actinic keratosis: Secondary | ICD-10-CM | POA: Diagnosis not present

## 2022-08-04 DIAGNOSIS — Z08 Encounter for follow-up examination after completed treatment for malignant neoplasm: Secondary | ICD-10-CM | POA: Diagnosis not present

## 2022-08-04 DIAGNOSIS — L821 Other seborrheic keratosis: Secondary | ICD-10-CM | POA: Diagnosis not present

## 2022-08-04 DIAGNOSIS — L814 Other melanin hyperpigmentation: Secondary | ICD-10-CM | POA: Diagnosis not present

## 2022-08-04 DIAGNOSIS — D225 Melanocytic nevi of trunk: Secondary | ICD-10-CM | POA: Diagnosis not present

## 2022-08-04 DIAGNOSIS — Z85828 Personal history of other malignant neoplasm of skin: Secondary | ICD-10-CM | POA: Diagnosis not present

## 2022-08-04 DIAGNOSIS — L218 Other seborrheic dermatitis: Secondary | ICD-10-CM | POA: Diagnosis not present

## 2022-08-04 NOTE — Progress Notes (Unsigned)
Office Visit Note  Patient: Annette Suarez             Date of Birth: 1941/01/27           MRN: [id number]             PCP: [name], NP Referring: [name], NP Visit Date: 08/05/2022 Occupation: '@GUAROCC'$ @  Subjective:  No chief complaint on file.   History of Present Illness: Annette Suarez is a 82 y.o. female ***     Activities of Daily Living:  Patient reports morning stiffness for *** {minute/hour:19697}.   Patient {ACTIONS;DENIES/REPORTS:21021675::"Denies"} nocturnal pain.  Difficulty dressing/grooming: {ACTIONS;DENIES/REPORTS:21021675::"Denies"} Difficulty climbing stairs: {ACTIONS;DENIES/REPORTS:21021675::"Denies"} Difficulty getting out of chair: {ACTIONS;DENIES/REPORTS:21021675::"Denies"} Difficulty using hands for taps, buttons, cutlery, and/or writing: {ACTIONS;DENIES/REPORTS:21021675::"Denies"}  No Rheumatology ROS completed.   PMFS History:  Patient Active Problem List   Diagnosis Date Noted   Coronary artery disease of native artery of native heart with stable angina pectoris ([place]) 01/27/2022   Hyperlipidemia associated with type 2 diabetes mellitus ([place]) 01/27/2022   Diabetes mellitus with coincident hypertension ([place]) 08/13/2021   Type 2 diabetes mellitus with stage 3a chronic kidney disease, with long-term current use of insulin ([place]) 08/13/2021   Multinodular goiter 08/13/2021   Subclavian arterial stenosis ([place]) 09/11/2020   Aortic atherosclerosis ([place]) 09/11/2020   Alteration in blood pressure 06/11/2020   Old complex tear of medial meniscus of right knee    Old complex tear of lateral meniscus of right knee    Primary osteoarthritis of both feet 12/13/2017   Dehydration 09/09/2017   Acute lower UTI 09/09/2017   Nausea & vomiting 09/08/2017   Primary osteoarthritis of both hands 04/05/2017   Left carpal tunnel syndrome 11/15/2016   Rheumatoid arthritis of multiple sites with negative rheumatoid factor ([place]) 10/06/2016   High risk medication  use 10/06/2016   Dyslipidemia 10/06/2016   Pain in both hands 09/08/2016   Hypertension associated with diabetes ([place]) 09/08/2016   Type 2 diabetes mellitus  09/08/2016   Gastroesophageal reflux disease without esophagitis 09/08/2016   Osteopenia of multiple sites 09/08/2016   History of anxiety 09/08/2016   Benign paroxysmal positional vertigo 09/08/2016   Sleep apnea 09/08/2016    Past Medical History:  Diagnosis Date   Acute cystitis    UTI   Anxiety    Arthritis    Benign positional vertigo    Dermatitis    Diabetes mellitus without complication ([place])    Family history of adverse reaction to anesthesia    GERD (gastroesophageal reflux disease)    Goiter, nontoxic, multinodular    Gout    H/O: hysterectomy    History of tobacco use    Hx laparoscopic cholecystectomy    Hypercholesterolemia    Hypertension    Incomplete bladder emptying    Insomnia    Nausea & vomiting 09/08/2017   Postmenopausal atrophic vaginitis    RA (rheumatoid arthritis) ([place])    Rectocele    Renal calculus    Skin cancer    Sleep apnea    Thyroid condition    Type 2 diabetes mellitus ([place])    Urolithiasis    Vaginal enterocele     Family History  Problem Relation Age of Onset   Diabetes Mother    Heart disease Father 43   Diabetes Son    Crohn's disease Son    Past Surgical History:  Procedure Laterality Date   CARPAL TUNNEL RELEASE  2018   CATARACT EXTRACTION, BILATERAL  CHOLECYSTECTOMY     COLONOSCOPY     07/11/2009, 02/2015   GALLBLADDER SURGERY     KNEE ARTHROSCOPY Right 05/20/2020   Procedure: RIGHT KNEE ARTHROSCOPY AND DEBRIDEMENT;  Surgeon: [name], MD;  Location: [facility];  Service: Orthopedics;  Laterality: Right;   PARTIAL HYSTERECTOMY     Overies left intact   SQUAMOUS CELL CARCINOMA EXCISION  05/2018   Social History   Social History Narrative   Diet: Blank      Do you drink/ eat things with caffeine? Yes      Marital status:  Married                              What year were you married ? 196      Do you live in a house, apartment,assistred living, condo, trailer, etc.)? House      Is it one or more stories? 1      How many persons live in your home ? 2      Do you have any pets in your home ?(please list)  1 Sam-Dog      Highest Level of education completed: 12      Current or past profession: Office      Do you exercise?   Some                           Type & how often  Walk Daily      ADVANCED DIRECTIVES (Please bring copies)      Do you have a living will? Blank      Do you have a DNR form?  Blank                     If not, do you want to discuss one?       Do you have signed POA?HPOA forms?  Yes               If so, please bring to your appointment      FUNCTIONAL STATUS- To be completed by Spouse / child / Staff       Do you have difficulty bathing or dressing yourself ?  No      Do you have difficulty preparing food or eating ?  No      Do you have difficulty managing your mediation ?  No      Do you have difficulty managing your finances ?  No      Do you have difficulty affording your medication ?  Yes (Some)      Immunization History  Administered Date(s) Administered   Fluad Quad(high Dose 65+) 04/09/2021   Influenza Split 03/28/2008, 03/05/2010, 04/17/2012, 04/20/2013   Influenza, High Dose Seasonal PF 02/08/2020, 05/04/2022   Influenza-Unspecified 05/11/2011, 04/22/2014, 04/15/2015, 02/20/2016, 03/02/2017, 03/09/2018, 03/20/2019, 03/21/2020   PFIZER(Purple Top)SARS-COV-2 Vaccination 07/24/2019, 08/06/2019, 08/27/2019, 04/21/2020   Pneumococcal Conjugate-13 05/02/2014   Pneumococcal Polysaccharide-23 04/23/2008   Tdap 05/01/2013   Zoster, Live 04/17/2012     Objective: Vital Signs: There were no vitals taken for this visit.   Physical Exam   Musculoskeletal Exam: ***  CDAI Exam: CDAI Score: -- Patient Global: --; Provider Global: -- Swollen: --; Tender: -- Joint Exam  08/05/2022   No joint exam has been documented for this visit   There is currently no information documented on the homunculus. Go  to the Rheumatology activity and complete the homunculus joint exam.  Investigation: No additional findings.  Imaging: No results found.  Recent Labs: Lab Results  Component Value Date   WBC 10.3 06/24/2022   HGB 12.8 06/24/2022   PLT 308 06/24/2022   NA 138 06/24/2022   K 4.8 06/24/2022   CL 101 06/24/2022   CO2 28 06/24/2022   GLUCOSE 198 (H) 06/24/2022   BUN 21 06/24/2022   CREATININE 1.26 (H) 06/24/2022   BILITOT 1.0 06/24/2022   ALKPHOS 83 07/25/2019   AST 10 06/24/2022   ALT 9 06/24/2022   PROT 6.5 06/24/2022   ALBUMIN 4.6 07/25/2019   CALCIUM 9.3 06/24/2022   GFRAA 43 (L) 01/09/2021   QFTBGOLDPLUS NEGATIVE 06/24/2022    Speciality Comments: Recieves Enbrel through [facility] 02/20/2019 Osteopenia managed by Dr. Annice Pih 02/20/2019  Procedures:  No procedures performed Allergies: Amaryl [glimepiride], Amlodipine, Codeine, Jardiance [empagliflozin], Magnesium citrate, Pneumovax 23 [pneumococcal vac polyvalent], Wellbutrin [bupropion], and Latex   Assessment / Plan:     Visit Diagnoses: No diagnosis found.  Orders: No orders of the defined types were placed in this encounter.  No orders of the defined types were placed in this encounter.   Face-to-face time spent with patient was *** minutes. Greater than 50% of time was spent in counseling and coordination of care.  Follow-Up Instructions: No follow-ups on file.   Earnestine Mealing, CMA  Note - This record has been created using Editor, commissioning.  Chart creation errors have been sought, but may not always  have been located. Such creation errors do not reflect on  the standard of medical care.

## 2022-08-05 ENCOUNTER — Ambulatory Visit: Payer: Medicare Other | Attending: Rheumatology | Admitting: Rheumatology

## 2022-08-05 ENCOUNTER — Encounter: Payer: Self-pay | Admitting: Rheumatology

## 2022-08-05 ENCOUNTER — Ambulatory Visit (INDEPENDENT_AMBULATORY_CARE_PROVIDER_SITE_OTHER): Payer: Medicare Other

## 2022-08-05 VITALS — BP 166/70 | HR 87 | Resp 13 | Ht 61.0 in | Wt 159.4 lb

## 2022-08-05 DIAGNOSIS — Z79899 Other long term (current) drug therapy: Secondary | ICD-10-CM

## 2022-08-05 DIAGNOSIS — M19041 Primary osteoarthritis, right hand: Secondary | ICD-10-CM | POA: Diagnosis not present

## 2022-08-05 DIAGNOSIS — M8589 Other specified disorders of bone density and structure, multiple sites: Secondary | ICD-10-CM | POA: Diagnosis not present

## 2022-08-05 DIAGNOSIS — M0609 Rheumatoid arthritis without rheumatoid factor, multiple sites: Secondary | ICD-10-CM | POA: Diagnosis not present

## 2022-08-05 DIAGNOSIS — E118 Type 2 diabetes mellitus with unspecified complications: Secondary | ICD-10-CM

## 2022-08-05 DIAGNOSIS — Z794 Long term (current) use of insulin: Secondary | ICD-10-CM

## 2022-08-05 DIAGNOSIS — Z8669 Personal history of other diseases of the nervous system and sense organs: Secondary | ICD-10-CM

## 2022-08-05 DIAGNOSIS — M542 Cervicalgia: Secondary | ICD-10-CM

## 2022-08-05 DIAGNOSIS — Z8639 Personal history of other endocrine, nutritional and metabolic disease: Secondary | ICD-10-CM

## 2022-08-05 DIAGNOSIS — M1711 Unilateral primary osteoarthritis, right knee: Secondary | ICD-10-CM

## 2022-08-05 DIAGNOSIS — Z8659 Personal history of other mental and behavioral disorders: Secondary | ICD-10-CM

## 2022-08-05 DIAGNOSIS — K219 Gastro-esophageal reflux disease without esophagitis: Secondary | ICD-10-CM

## 2022-08-05 DIAGNOSIS — M5136 Other intervertebral disc degeneration, lumbar region: Secondary | ICD-10-CM

## 2022-08-05 DIAGNOSIS — M19071 Primary osteoarthritis, right ankle and foot: Secondary | ICD-10-CM | POA: Diagnosis not present

## 2022-08-05 DIAGNOSIS — Z87891 Personal history of nicotine dependence: Secondary | ICD-10-CM

## 2022-08-05 DIAGNOSIS — Z87898 Personal history of other specified conditions: Secondary | ICD-10-CM

## 2022-08-05 DIAGNOSIS — M19072 Primary osteoarthritis, left ankle and foot: Secondary | ICD-10-CM

## 2022-08-05 DIAGNOSIS — M51369 Other intervertebral disc degeneration, lumbar region without mention of lumbar back pain or lower extremity pain: Secondary | ICD-10-CM

## 2022-08-05 DIAGNOSIS — M7061 Trochanteric bursitis, right hip: Secondary | ICD-10-CM | POA: Diagnosis not present

## 2022-08-05 DIAGNOSIS — I10 Essential (primary) hypertension: Secondary | ICD-10-CM

## 2022-08-05 DIAGNOSIS — R252 Cramp and spasm: Secondary | ICD-10-CM

## 2022-08-05 DIAGNOSIS — M19042 Primary osteoarthritis, left hand: Secondary | ICD-10-CM

## 2022-08-05 MED ORDER — PREDNISONE 5 MG PO TABS: ORAL_TABLET | ORAL | 0 refills | Status: DC

## 2022-08-05 MED ORDER — BACLOFEN 10 MG PO TABS: 10.0000 mg | ORAL_TABLET | Freq: Every evening | ORAL | 0 refills | Status: DC | PRN

## 2022-08-05 NOTE — Patient Instructions (Signed)
Standing Labs We placed an order today for your standing lab work.   Please have your standing labs drawn in March  Please have your labs drawn 2 weeks prior to your appointment so that the provider can discuss your lab results at your appointment.  Please note that you may see your imaging and lab results in [facility] before we have reviewed them. We will contact you once all results are reviewed. Please allow our office up to 72 hours to thoroughly review all of the results before contacting the office for clarification of your results.  Lab hours are:   Monday through Thursday from 8:00 am -12:30 pm and 1:00 pm-5:00 pm and Friday from 8:00 am-12:00 pm.  Please be advised, all patients with office appointments requiring lab work will take precedent over walk-in lab work.   Labs are drawn by [facility]. Please bring your co-pay at the time of your lab draw.  You may receive a bill from [facility] for your lab work.  Please note if you are on Hydroxychloroquine and and an order has been placed for a Hydroxychloroquine level, you will need to have it drawn 4 hours or more after your last dose.  If you wish to have your labs drawn at another location, please call the office 24 hours in advance so we can fax the orders.  The office is located at [address], [place], [place],  [address] No appointment is necessary.    If you have any questions regarding directions or hours of operation,  please call [phone].   As a reminder, please drink plenty of water prior to coming for your lab work. Thanks!  Vaccines You are taking a medication(s) that can suppress your immune system.  The following immunizations are recommended: Flu annually Covid-19  Td/Tdap (tetanus, diphtheria, pertussis) every 10 years Pneumonia (Prevnar 15 then Pneumovax 23 at least 1 year apart.  Alternatively, can take Prevnar 20 without needing additional dose) Shingrix: 2 doses from 4 weeks to 6 months  apart  Please check with your PCP to make sure you are up to date.   If you have signs or symptoms of an infection or start antibiotics: First, call your PCP for workup of your infection. Hold your medication through the infection, until you complete your antibiotics, and until symptoms resolve if you take the following: Injectable medication (Actemra, Benlysta, Cimzia, Cosentyx, Enbrel, Humira, Kevzara, Orencia, Remicade, Simponi, Stelara, Taltz, Tremfya) Methotrexate Leflunomide (Arava) Mycophenolate (Cellcept) Morrie [place], Olumiant, or Rinvoq  Please get an annual skin examination to screen for skin cancer while you are on Enbrel.

## 2022-08-07 ENCOUNTER — Other Ambulatory Visit: Payer: Self-pay | Admitting: Orthopedic Surgery

## 2022-08-31 NOTE — Progress Notes (Deleted)
Office Visit Note  Patient: Annette Suarez             Date of Birth: 1941-04-11           MRN: [id number]             PCP: [name], NP Referring: [name], NP Visit Date: 09/14/2022 Occupation: '@GUAROCC'$ @  Subjective:  No chief complaint on file.   History of Present Illness: Annette Suarez is a 82 y.o. female ***     Activities of Daily Living:  Patient reports morning stiffness for *** {minute/hour:19697}.   Patient {ACTIONS;DENIES/REPORTS:21021675::"Denies"} nocturnal pain.  Difficulty dressing/grooming: {ACTIONS;DENIES/REPORTS:21021675::"Denies"} Difficulty climbing stairs: {ACTIONS;DENIES/REPORTS:21021675::"Denies"} Difficulty getting out of chair: {ACTIONS;DENIES/REPORTS:21021675::"Denies"} Difficulty using hands for taps, buttons, cutlery, and/or writing: {ACTIONS;DENIES/REPORTS:21021675::"Denies"}  No Rheumatology ROS completed.   PMFS History:  Patient Active Problem List   Diagnosis Date Noted   Coronary artery disease of native artery of native heart with stable angina pectoris ([place]) 01/27/2022   Hyperlipidemia associated with type 2 diabetes mellitus ([place]) 01/27/2022   Diabetes mellitus with coincident hypertension ([place]) 08/13/2021   Type 2 diabetes mellitus with stage 3a chronic kidney disease, with long-term current use of insulin ([place]) 08/13/2021   Multinodular goiter 08/13/2021   Subclavian arterial stenosis ([place]) 09/11/2020   Aortic atherosclerosis ([place]) 09/11/2020   Alteration in blood pressure 06/11/2020   Old complex tear of medial meniscus of right knee    Old complex tear of lateral meniscus of right knee    Primary osteoarthritis of both feet 12/13/2017   Dehydration 09/09/2017   Acute lower UTI 09/09/2017   Nausea & vomiting 09/08/2017   Primary osteoarthritis of both hands 04/05/2017   Left carpal tunnel syndrome 11/15/2016   Rheumatoid arthritis of multiple sites with negative rheumatoid factor ([place]) 10/06/2016   High risk medication  use 10/06/2016   Dyslipidemia 10/06/2016   Pain in both hands 09/08/2016   Hypertension associated with diabetes ([place]) 09/08/2016   Type 2 diabetes mellitus  09/08/2016   Gastroesophageal reflux disease without esophagitis 09/08/2016   Osteopenia of multiple sites 09/08/2016   History of anxiety 09/08/2016   Benign paroxysmal positional vertigo 09/08/2016   Sleep apnea 09/08/2016    Past Medical History:  Diagnosis Date   Acute cystitis    UTI   Anxiety    Arthritis    Benign positional vertigo    Dermatitis    Diabetes mellitus without complication ([place])    Family history of adverse reaction to anesthesia    GERD (gastroesophageal reflux disease)    Goiter, nontoxic, multinodular    Gout    H/O: hysterectomy    History of tobacco use    Hx laparoscopic cholecystectomy    Hypercholesterolemia    Hypertension    Incomplete bladder emptying    Insomnia    Nausea & vomiting 09/08/2017   Postmenopausal atrophic vaginitis    RA (rheumatoid arthritis) ([place])    Rectocele    Renal calculus    Skin cancer    Sleep apnea    Thyroid condition    Type 2 diabetes mellitus ([place])    Urolithiasis    Vaginal enterocele     Family History  Problem Relation Age of Onset   Diabetes Mother    Heart disease Father 13   Diabetes Son    Crohn's disease Son    Past Surgical History:  Procedure Laterality Date   CARPAL TUNNEL RELEASE  2018   CATARACT EXTRACTION, BILATERAL  CHOLECYSTECTOMY     COLONOSCOPY     07/11/2009, 02/2015   GALLBLADDER SURGERY     KNEE ARTHROSCOPY Right 05/20/2020   Procedure: RIGHT KNEE ARTHROSCOPY AND DEBRIDEMENT;  Surgeon: [name], MD;  Location: [facility];  Service: Orthopedics;  Laterality: Right;   PARTIAL HYSTERECTOMY     Overies left intact   SQUAMOUS CELL CARCINOMA EXCISION  05/2018   Social History   Social History Narrative   Diet: Blank      Do you drink/ eat things with caffeine? Yes      Marital status:  Married                              What year were you married ? 196      Do you live in a house, apartment,assistred living, condo, trailer, etc.)? House      Is it one or more stories? 1      How many persons live in your home ? 2      Do you have any pets in your home ?(please list)  1 Sam-Dog      Highest Level of education completed: 12      Current or past profession: Office      Do you exercise?   Some                           Type & how often  Walk Daily      ADVANCED DIRECTIVES (Please bring copies)      Do you have a living will? Blank      Do you have a DNR form?  Blank                     If not, do you want to discuss one?       Do you have signed POA?HPOA forms?  Yes               If so, please bring to your appointment      FUNCTIONAL STATUS- To be completed by Spouse / child / Staff       Do you have difficulty bathing or dressing yourself ?  No      Do you have difficulty preparing food or eating ?  No      Do you have difficulty managing your mediation ?  No      Do you have difficulty managing your finances ?  No      Do you have difficulty affording your medication ?  Yes (Some)      Immunization History  Administered Date(s) Administered   Fluad Quad(high Dose 65+) 04/09/2021   Influenza Split 03/28/2008, 03/05/2010, 04/17/2012, 04/20/2013   Influenza, High Dose Seasonal PF 02/08/2020, 05/04/2022   Influenza-Unspecified 05/11/2011, 04/22/2014, 04/15/2015, 02/20/2016, 03/02/2017, 03/09/2018, 03/20/2019, 03/21/2020   PFIZER(Purple Top)SARS-COV-2 Vaccination 07/24/2019, 08/06/2019, 08/27/2019, 04/21/2020   Pneumococcal Conjugate-13 05/02/2014   Pneumococcal Polysaccharide-23 04/23/2008   Tdap 05/01/2013   Zoster, Live 04/17/2012     Objective: Vital Signs: There were no vitals taken for this visit.   Physical Exam   Musculoskeletal Exam: ***  CDAI Exam: CDAI Score: -- Patient Global: --; Provider Global: -- Swollen: --; Tender: -- Joint Exam  09/14/2022   No joint exam has been documented for this visit   There is currently no information documented on the homunculus. Go  to the Rheumatology activity and complete the homunculus joint exam.  Investigation: No additional findings.  Imaging: XR Cervical Spine 2 or 3 views  Result Date: 08/05/2022 Multilevel spondylosis with anterior osteophytes was noted.  Significant narrowing was noted between C4-5, C5-6, C6-C7.  Facet joint arthropathy was noted. Impression: These findings are consistent with multilevel severe degenerative disc disease and facet joint arthropathy.   Recent Labs: Lab Results  Component Value Date   WBC 10.3 06/24/2022   HGB 12.8 06/24/2022   PLT 308 06/24/2022   NA 138 06/24/2022   K 4.8 06/24/2022   CL 101 06/24/2022   CO2 28 06/24/2022   GLUCOSE 198 (H) 06/24/2022   BUN 21 06/24/2022   CREATININE 1.26 (H) 06/24/2022   BILITOT 1.0 06/24/2022   ALKPHOS 83 07/25/2019   AST 10 06/24/2022   ALT 9 06/24/2022   PROT 6.5 06/24/2022   ALBUMIN 4.6 07/25/2019   CALCIUM 9.3 06/24/2022   GFRAA 43 (L) 01/09/2021   QFTBGOLDPLUS NEGATIVE 06/24/2022    Speciality Comments: Recieves Enbrel through [facility] 02/20/2019 Osteopenia managed by Dr. Annice Pih 02/20/2019  Procedures:  No procedures performed Allergies: Amaryl [glimepiride], Amlodipine, Codeine, Jardiance [empagliflozin], Magnesium citrate, Pneumovax 23 [pneumococcal vac polyvalent], Wellbutrin [bupropion], and Latex   Assessment / Plan:     Visit Diagnoses: Rheumatoid arthritis of multiple sites with negative rheumatoid factor ([place])  High risk medication use  Primary osteoarthritis of both hands  Trochanteric bursitis, right hip  Primary osteoarthritis of right knee  Primary osteoarthritis of both feet  DDD (degenerative disc disease), lumbar  Osteopenia of multiple sites  Type 2 diabetes mellitus   Essential hypertension  History of hyperlipidemia  History  of sleep apnea  Gastroesophageal reflux disease without esophagitis  History of vertigo  Nocturnal muscle cramps  History of anxiety  Former smoker  Orders: No orders of the defined types were placed in this encounter.  No orders of the defined types were placed in this encounter.   Face-to-face time spent with patient was *** minutes. Greater than 50% of time was spent in counseling and coordination of care.  Follow-Up Instructions: No follow-ups on file.   Ofilia Neas, PA-C  Note - This record has been created using Dragon software.  Chart creation errors have been sought, but may not always  have been located. Such creation errors do not reflect on  the standard of medical care.

## 2022-09-09 ENCOUNTER — Telehealth: Payer: Self-pay

## 2022-09-09 ENCOUNTER — Other Ambulatory Visit: Payer: Self-pay

## 2022-09-09 DIAGNOSIS — M0609 Rheumatoid arthritis without rheumatoid factor, multiple sites: Secondary | ICD-10-CM

## 2022-09-09 DIAGNOSIS — Z79899 Other long term (current) drug therapy: Secondary | ICD-10-CM

## 2022-09-09 MED ORDER — ENBREL SURECLICK 50 MG/ML ~~LOC~~ SOAJ: 50.0000 mg | SUBCUTANEOUS | 0 refills | Status: DC

## 2022-09-09 NOTE — Progress Notes (Signed)
Name: Annette Suarez  Age/ Sex: 82 y.o., female   MRN/ DOB: [id number], 09-12-1940     PCP: [name], NP   Reason for Endocrinology Evaluation: Type 2 Diabetes Mellitus  Initial Endocrine Consultative Visit: 05/11/2021    PATIENT IDENTIFIER: Annette Suarez is a 81 y.o. female with a past medical history of T2DM, HTN, RA and dyslipidemia. The patient has followed with Endocrinology clinic since 05/11/2021 for consultative assistance with management of her diabetes.  DIABETIC HISTORY:  Annette Suarez was diagnosed with DM in ~ 1995 . She is intolerant to Metformin- nausea and Jardiance due to UTI's .Her hemoglobin A1c has ranged from 7.5% in 2021, peaking at 8.6% in 2023.  Transitioned care from Dr. Loanne Drilling [id number]  Started trulicity in [id number]  THYROID HISTORY : She has been diagnosed with MNG based on thyroid ultrasound in 2011 showing multiple thyroid nodules. She is s/p benign cytology of the left middle and left inferior nodules in 2011. Ultrasound in 2021 confirmed at least a 5 year stability    SUBJECTIVE:   During the last visit (03/09/2022): A1c 8.7 %     Today (09/10/2022): Annette Suarez is here to establish care for diabetes. She has been checking blood sugars multiple times daily through CGM. The patient has  had hypoglycemic episodes since the last clinic visit.  She has minimal nausea with Ozempic  Denies diarrhea  NO recent hair loss , follows with dermatology   Denies any local neck symptoms     H/OME DIABETES REGIMEN:  Tresiba 64 units daily  Ozempic 0.5 mg weekly ( Weekly)    Statin: yes ACE-I/ARB: yes     CONTINUOUS GLUCOSE MONITORING RECORD INTERPRETATION    Dates of Recording: 2/24-09/10/2022  Sensor description:freestyle libre  Results statistics:   CGM use % of time 96  Average and SD 194/35.2  Time in range 44 %  % Time Above 180 37  % Time above 250 19  % Time Below target 0     Glycemic patterns summary:BG's trend down at night and increase  during the day   Hyperglycemic episodes  postprandial   Hypoglycemic episodes occurred at night   Overnight periods: trends down    DIABETIC COMPLICATIONS: Microvascular complications:  CKD III Denies: retinopathy, neuropathy  Last Eye Exam: Completed 07/2021  Macrovascular complications:   Denies: CAD, CVA, PVD   HISTORY:  Past Medical History:  Past Medical History:  Diagnosis Date   Acute cystitis    UTI   Anxiety    Arthritis    Benign positional vertigo    Dermatitis    Diabetes mellitus without complication (HCC)    Family history of adverse reaction to anesthesia    GERD (gastroesophageal reflux disease)    Goiter, nontoxic, multinodular    Gout    H/O: hysterectomy    History of tobacco use    Hx laparoscopic cholecystectomy    Hypercholesterolemia    Hypertension    Incomplete bladder emptying    Insomnia    Nausea & vomiting 09/08/2017   Postmenopausal atrophic vaginitis    RA (rheumatoid arthritis) ([place])    Rectocele    Renal calculus    Skin cancer    Sleep apnea    Thyroid condition    Type 2 diabetes mellitus ([place])    Urolithiasis    Vaginal enterocele    Past Surgical History:  Past Surgical History:  Procedure Laterality Date   CARPAL TUNNEL RELEASE  2018  CATARACT EXTRACTION, BILATERAL     CHOLECYSTECTOMY     COLONOSCOPY     07/11/2009, 02/2015   GALLBLADDER SURGERY     KNEE ARTHROSCOPY Right 05/20/2020   Procedure: RIGHT KNEE ARTHROSCOPY AND DEBRIDEMENT;  Surgeon: [name], MD;  Location: [facility];  Service: Orthopedics;  Laterality: Right;   PARTIAL HYSTERECTOMY     Overies left intact   SQUAMOUS CELL CARCINOMA EXCISION  05/2018   Social History:  reports that she quit smoking about 44 years ago. Her smoking use included cigarettes. She has a 2.00 pack-year smoking history. She has been exposed to tobacco smoke. She has never used smokeless tobacco. She reports that she does not drink alcohol and does not use  drugs. Family History:  Family History  Problem Relation Age of Onset   Diabetes Mother    Heart disease Father 24   Diabetes Son    Crohn's disease Son      HOME MEDICATIONS: Allergies as of 09/10/2022       Reactions   Amaryl [glimepiride]    Don't Remember   Amlodipine    LE edema on 7.'5mg'$  daily. Tolerates '5mg'$  daily just fine   Codeine    SICK   Jardiance [empagliflozin]    Urinary tract infection- SIDE EFFECT   Magnesium Citrate    Strong urine odor and dysuria   Pneumovax 23 [pneumococcal Vac Polyvalent]    Red / Swelling / Hot to touch SIDE EFFECTS   Wellbutrin [bupropion]    Unknown   Latex Rash        Medication List        Accurate as of September 10, 2022 11:46 AM. If you have any questions, ask your nurse or doctor.          STOP taking these medications    predniSONE 5 MG tablet Commonly known as: DELTASONE Stopped by: Dorita Sciara, MD       TAKE these medications    Accu-Chek Softclix Lancets lancets Use to test blood sugar three times daily. Dx: E11.8   aspirin EC 81 MG tablet Take 81 mg by mouth daily.   baclofen 10 MG tablet Commonly known as: LIORESAL Take 1 tablet (10 mg total) by mouth at bedtime as needed for muscle spasms.   Biotin 1 MG Caps Take by mouth.   carvedilol 6.25 MG tablet Commonly known as: COREG Take 1 tablet (6.25 mg total) by mouth 2 (two) times daily.   chlorthalidone 25 MG tablet Commonly known as: HYGROTON Take 1 tablet (25 mg total) by mouth daily.   Enbrel SureClick 50 MG/ML injection Generic drug: etanercept Inject 50 mg into the skin once a week.   estradiol 0.1 MG/GM vaginal cream Commonly known as: ESTRACE Place 1 Applicatorful vaginally 3 (three) times a week.   fluocinonide 0.05 % external solution Commonly known as: LIDEX Apply 1 Application topically daily as needed.   FreeStyle Libre 2 Reader [employer] 2 Sensor Misc USE TO CHECK BLOOD SUGAR **CHANGE EVERY 14  DAYS**   Insulin Pen Needle 31G X 5 MM Misc 1 Device by Does not apply route daily in the afternoon.   irbesartan 300 MG tablet Commonly known as: AVAPRO TAKE ONE TABLET BY MOUTH DAILY   loperamide 2 MG tablet Commonly known as: IMODIUM A-D Take 2 mg by mouth as needed for diarrhea or loose stools.   LORazepam 0.5 MG tablet Commonly known as: ATIVAN TAKE ONE TABLET BY MOUTH  EVERY NIGHT AT BEDTIME   MELATONIN PO Take 10 mg by mouth at bedtime as needed.   nitroGLYCERIN 0.4 MG SL tablet Commonly known as: NITROSTAT Place 1 tablet (0.4 mg total) under the tongue every 5 (five) minutes as needed for chest pain.   Omeprazole 20 MG Tbdd Take 20 mg by mouth daily.   ondansetron 4 MG tablet Commonly known as: ZOFRAN Take 1 tablet (4 mg total) by mouth 3 (three) times daily as needed for nausea or vomiting.   Ozempic (0.25 or 0.5 MG/DOSE) 2 MG/1.5ML Sopn Generic drug: Semaglutide(0.25 or 0.'5MG'$ /DOS) Inject 0.5 mg into the skin once a week.   PROBIOTIC PO Take by mouth daily.   simvastatin 20 MG tablet Commonly known as: ZOCOR TAKE ONE TABLET BY MOUTH EVERY EVENING   Tresiba FlexTouch 200 UNIT/ML FlexTouch Pen Generic drug: insulin degludec Inject 60 Units into the skin daily in the afternoon. What changed: how much to take Changed by: Dorita Sciara, MD   Vitamin D3 50 MCG (2000 UT) Caps Take 2,000 Units by mouth daily.   VOLTAREN EX Apply 1 application topically 3 (three) times daily as needed.         OBJECTIVE:   Vital Signs: Pulse 81   Ht '5\' 1"'$  (1.549 m)   Wt 158 lb (71.7 kg)   SpO2 96%   BMI 29.85 kg/m   Wt Readings from Last 3 Encounters:  09/10/22 158 lb (71.7 kg)  08/05/22 159 lb 6.4 oz (72.3 kg)  07/20/22 159 lb 3.2 oz (72.2 kg)     Exam: General: Annette Suarez appears well and is in NAD  Neck: General: Supple without adenopathy. Thyroid: Thyroid is prominent on the right  Lungs: Clear with good BS bilat with no rales, rhonchi, or wheezes   Heart: RRR with norm   Extremities: No pretibial edema.  Neuro: MS is good with appropriate affect, Annette Suarez is alert and Ox3   DM Foot Exam 09/10/2022  The skin of the feet is intact without sores or ulcerations. The pedal pulses are 2+ on right and 2+ on left. The sensation is intact to a screening 5.07, 10 gram monofilament bilaterally    DATA REVIEWED:  Lab Results  Component Value Date   HGBA1C 7.8 (A) 09/10/2022   HGBA1C 8.6 (H) 07/07/2022   HGBA1C 8.7 (A) 03/09/2022   Lab Results  Component Value Date   MICROALBUR 0.5 04/02/2022   LDLCALC 50 08/06/2021   CREATININE 1.26 (H) 06/24/2022   Lab Results  Component Value Date   MICRALBCREAT 8 04/02/2022     Lab Results  Component Value Date   CHOL 112 08/06/2021   HDL 39 (L) 08/06/2021   LDLCALC 50 08/06/2021   TRIG 146 08/06/2021   CHOLHDL 2.9 08/06/2021        FNA of left medial 02/11/2010  INTERPRETATION(S):   THYROID, FINE NEEDLE ASPIRATION,LEFT #1:   Findings consistent with non-neoplastic goiter     FNA left inferior thyroid nodule 02/11/2010  INTERPRETATION(S):   THYROID, FINE NEEDLE ASPIRATION, LEFT, #2:   Findings consistent with non-neoplastic goiter.   ASSESSMENT / PLAN / RECOMMENDATIONS:   1) Type 2 Diabetes Mellitus, Poorly  controlled, With CKD III complications - Most recent A1c of 7.8 %. Goal A1c < 7.5 %.    - A1c  trended down  - She is on Annette Suarez assistance for Ozempic, she declines increasing the dose due to nausea but she also declines discontinuing  - Limited exercise due to knee pains -  She is intolerant to Jardiance  - Will decrease insulin due to hypoglycemia    MEDICATIONS: Continue Ozempic 0.5 mg weekly(samples) Decrease Tresiba 60 units daily  EDUCATION / INSTRUCTIONS: BG monitoring instructions: Patient is instructed to check her blood sugars 2 times a day, fasting and suppertime. Call [facility] if: BG persistently < 70  I reviewed the Rule of 15 for the  treatment of hypoglycemia in detail with the patient. Literature supplied.   2) Diabetic complications:  Eye: Does not have known diabetic retinopathy.  Neuro/ Feet: Does not have known diabetic peripheral neuropathy .  Renal: Patient does  have known baseline CKD. She   is  on an ACEI/ARB at present.    3) Dyslipidemia :   -  LDL  has been at goal , no change    Medication  Simvastatin 20 mg daily       F/U in 6 months         Signed electronically by: Mack Guise, MD  [facility]  [address] [place], [place] [address] Phone: [phone] FAX: [phone]   CC: [name], NP [address] [place] [address] Phone: [phone]  Fax: [phone]  Return to Endocrinology clinic as below: Future Appointments  Date Time Provider [place]  09/14/2022 10:00 AM Ofilia Neas, PA-C [facility] None  10/21/2022 10:00 AM [name], NP [facility] None  11/03/2022  2:00 PM Bo Merino, MD [facility] None  05/19/2023 11:00 AM [name], NP [facility] None

## 2022-09-09 NOTE — Telephone Encounter (Signed)
Patient called requesting refill of Enbrel to be sent to [facility].  Next Visit: 09/14/2022  Last Visit: 08/05/2022  Last Fill: 03/26/2022  [id number] arthritis of multiple sites with negative rheumatoid factor   Current Dose per office note 08/05/2022: Enbrel 50 mg sq injections once weekly   Labs: 06/24/2022 CBC is normal.  Glucose is elevated at 198.  Creatinine is elevated and is stable   TB Gold: 06/24/2022 TB gold negative   Patient to update labs at [place] visit.    Okay to refill Enbrel?

## 2022-09-09 NOTE — Telephone Encounter (Signed)
Attempted to contact patient and left a message advising patient Dr.Deveshwar sent in a prescription for Enbrel to [facility].

## 2022-09-10 ENCOUNTER — Encounter: Payer: Self-pay | Admitting: Internal Medicine

## 2022-09-10 ENCOUNTER — Ambulatory Visit: Payer: Medicare Other | Admitting: Internal Medicine

## 2022-09-10 VITALS — HR 81 | Ht 61.0 in | Wt 158.0 lb

## 2022-09-10 DIAGNOSIS — E1165 Type 2 diabetes mellitus with hyperglycemia: Secondary | ICD-10-CM | POA: Diagnosis not present

## 2022-09-10 DIAGNOSIS — Z794 Long term (current) use of insulin: Secondary | ICD-10-CM | POA: Diagnosis not present

## 2022-09-10 LAB — POCT GLYCOSYLATED HEMOGLOBIN (HGB A1C): Hemoglobin A1C: 7.8 % — AB (ref 4.0–5.6)

## 2022-09-10 MED ORDER — INSULIN PEN NEEDLE 31G X 5 MM MISC: 1.0000 | Freq: Every day | 3 refills | Status: DC

## 2022-09-10 MED ORDER — TRESIBA FLEXTOUCH 200 UNIT/ML ~~LOC~~ SOPN: 60.0000 [IU] | PEN_INJECTOR | Freq: Every day | SUBCUTANEOUS | 6 refills | Status: DC

## 2022-09-10 NOTE — Patient Instructions (Signed)
Decrease  Tresiba 60 units ONCE Daily  Continue Ozempic 0.5 mg weekly     HOW TO TREAT LOW BLOOD SUGARS (Blood sugar LESS THAN 70 MG/DL) Please follow the RULE OF 15 for the treatment of hypoglycemia treatment (when your (blood sugars are less than 70 mg/dL)   STEP 1: Take 15 grams of carbohydrates when your blood sugar is low, which includes:  3-4 GLUCOSE TABS  OR 3-4 OZ OF JUICE OR REGULAR SODA OR ONE TUBE OF GLUCOSE GEL    STEP 2: RECHECK blood sugar in 15 MINUTES STEP 3: If your blood sugar is still low at the 15 minute recheck --> then, go back to STEP 1 and treat AGAIN with another 15 grams of carbohydrates.

## 2022-09-14 ENCOUNTER — Ambulatory Visit: Payer: Medicare Other | Admitting: Physician Assistant

## 2022-09-14 DIAGNOSIS — Z87898 Personal history of other specified conditions: Secondary | ICD-10-CM

## 2022-09-14 DIAGNOSIS — Z87891 Personal history of nicotine dependence: Secondary | ICD-10-CM

## 2022-09-14 DIAGNOSIS — M19071 Primary osteoarthritis, right ankle and foot: Secondary | ICD-10-CM

## 2022-09-14 DIAGNOSIS — E118 Type 2 diabetes mellitus with unspecified complications: Secondary | ICD-10-CM

## 2022-09-14 DIAGNOSIS — M8589 Other specified disorders of bone density and structure, multiple sites: Secondary | ICD-10-CM

## 2022-09-14 DIAGNOSIS — Z8639 Personal history of other endocrine, nutritional and metabolic disease: Secondary | ICD-10-CM

## 2022-09-14 DIAGNOSIS — K219 Gastro-esophageal reflux disease without esophagitis: Secondary | ICD-10-CM

## 2022-09-14 DIAGNOSIS — M5136 Other intervertebral disc degeneration, lumbar region: Secondary | ICD-10-CM

## 2022-09-14 DIAGNOSIS — M7061 Trochanteric bursitis, right hip: Secondary | ICD-10-CM

## 2022-09-14 DIAGNOSIS — Z8659 Personal history of other mental and behavioral disorders: Secondary | ICD-10-CM

## 2022-09-14 DIAGNOSIS — M1711 Unilateral primary osteoarthritis, right knee: Secondary | ICD-10-CM

## 2022-09-14 DIAGNOSIS — Z8669 Personal history of other diseases of the nervous system and sense organs: Secondary | ICD-10-CM

## 2022-09-14 DIAGNOSIS — R252 Cramp and spasm: Secondary | ICD-10-CM

## 2022-09-14 DIAGNOSIS — I10 Essential (primary) hypertension: Secondary | ICD-10-CM

## 2022-09-14 DIAGNOSIS — Z79899 Other long term (current) drug therapy: Secondary | ICD-10-CM

## 2022-09-14 DIAGNOSIS — M0609 Rheumatoid arthritis without rheumatoid factor, multiple sites: Secondary | ICD-10-CM

## 2022-09-14 DIAGNOSIS — M19042 Primary osteoarthritis, left hand: Secondary | ICD-10-CM

## 2022-09-22 DIAGNOSIS — N3000 Acute cystitis without hematuria: Secondary | ICD-10-CM | POA: Diagnosis not present

## 2022-09-22 DIAGNOSIS — R8271 Bacteriuria: Secondary | ICD-10-CM | POA: Diagnosis not present

## 2022-09-22 DIAGNOSIS — L900 Lichen sclerosus et atrophicus: Secondary | ICD-10-CM | POA: Diagnosis not present

## 2022-09-25 ENCOUNTER — Other Ambulatory Visit: Payer: Self-pay | Admitting: Orthopedic Surgery

## 2022-09-25 DIAGNOSIS — F419 Anxiety disorder, unspecified: Secondary | ICD-10-CM

## 2022-09-25 DIAGNOSIS — G4709 Other insomnia: Secondary | ICD-10-CM

## 2022-09-27 NOTE — Telephone Encounter (Signed)
Pharmacy requested refill.  Epic LR: 03/10/2022 Contract Date: 11/27/2020 Note added to upcoming appointment to update.   Pended Rx and sent to [facility] for approval.

## 2022-09-28 ENCOUNTER — Telehealth: Payer: Self-pay

## 2022-09-28 NOTE — Telephone Encounter (Signed)
Patient advise that Ozempic patient assistance is ready for pick up

## 2022-09-29 NOTE — Telephone Encounter (Signed)
Patient came in to office today and picked up 5 boxes patient assistance Ozempic.

## 2022-10-01 ENCOUNTER — Other Ambulatory Visit: Payer: Self-pay | Admitting: Internal Medicine

## 2022-10-13 ENCOUNTER — Telehealth: Payer: Self-pay | Admitting: Internal Medicine

## 2022-10-13 NOTE — H&P (View-Only) (Signed)
Office Visit    Patient Name: Annette Suarez Date of Encounter: 10/14/2022  PCP:  Octavia Heir, NP   [facility]  Cardiologist:  [name], MD  Advanced Practice Provider:  No care team member to display Electrophysiologist:  None 6}  HPI    Annette Suarez is a 82 y.o. female with a past medical history of diabetes, hypertension, hyperlipidemia, OSA not on CPAP who originally presented for evaluation 06/11/2020 presents today for follow-up visit.  In 2022 she had subclavian stenosis and aortic atherosclerosis seen by Dr. Lodi Sink for PAD.  Stopped Norvasc due to leg edema.  2023 she saw the DOD for chest pain, CCTA was ordered, FFR negative.  Blood pressure continued to improve.  Seen by Dr. Izora Ribas July 2023 and at that time she was doing well.  Her husband was doing most heavy lifting because of concerns that she will fall.  She did not have any interval hospital or ED visits.  No chest pain or pressure.  No SOB/DOE and no PND/orthopnea.  Coughing and had increased congestion that summer.  She had noted weight loss on Ozempic (only new medication).  No palpitations or syncope.  No leg swelling.  Today, she tells me that things have overall been going well however in the last week she has noticed some significant changes.  When she lays down at night and when she exerts herself she has a squeezing in her chest.  It hurts across her shoulders and in her back some as well.  She also has some indigestion with it.  Saturday night was the worst she had just finished doing some light housework and cook dinner.  She drank some ginger ale and had the pain.  She felt like if she could burp it would help.  She feels like she cannot do as much as she used to do and her exercise tolerance has decreased significantly.  She did take some nitro and it did help and she was able to fall asleep.  When now when she goes to the store and now she typically is out of breath which  is new for her.  We discussed a couple different options including a PET stress test and cardiac catheterization.Also discussed with Dr. Izora Ribas.   Reports no shortness of breath nor dyspnea on exertion.No edema, orthopnea, PND. Reports no palpitations.    Past Medical History    Past Medical History:  Diagnosis Date   Acute cystitis    UTI   Anxiety    Arthritis    Benign positional vertigo    Dermatitis    Diabetes mellitus without complication    Family history of adverse reaction to anesthesia    GERD (gastroesophageal reflux disease)    Goiter, nontoxic, multinodular    Gout    H/O: hysterectomy    History of tobacco use    Hx laparoscopic cholecystectomy    Hypercholesterolemia    Hypertension    Incomplete bladder emptying    Insomnia    Nausea & vomiting 09/08/2017   Postmenopausal atrophic vaginitis    RA (rheumatoid arthritis)    Rectocele    Renal calculus    Skin cancer    Sleep apnea    Thyroid condition    Type 2 diabetes mellitus    Urolithiasis    Vaginal enterocele    Past Surgical History:  Procedure Laterality Date   CARPAL TUNNEL RELEASE  2018   CATARACT EXTRACTION, BILATERAL  CHOLECYSTECTOMY     COLONOSCOPY     07/11/2009, 02/2015   GALLBLADDER SURGERY     KNEE ARTHROSCOPY Right 05/20/2020   Procedure: RIGHT KNEE ARTHROSCOPY AND DEBRIDEMENT;  Surgeon: Nadara Mustard, MD;  Location: [facility];  Service: Orthopedics;  Laterality: Right;   PARTIAL HYSTERECTOMY     Overies left intact   SQUAMOUS CELL CARCINOMA EXCISION  05/2018    Allergies  Allergies  Allergen Reactions   Amaryl [Glimepiride]     Don't Remember   Amlodipine     LE edema on 7.5mg  daily. Tolerates 5mg  daily just fine   Codeine     SICK   Jardiance [Empagliflozin]     Urinary tract infection- SIDE EFFECT   Magnesium Citrate     Strong urine odor and dysuria   Pneumovax 23 [Pneumococcal Vac Polyvalent]     Red / Swelling / Hot to touch SIDE  EFFECTS   Wellbutrin [Bupropion]     Unknown   Latex Rash     EKGs/Labs/Other Studies Reviewed:   The following studies were reviewed today:  Transthoracic Echocardiogram Date: 07/08/20 Results  1. Left ventricular ejection fraction, by estimation, is >75%. The left  ventricle has hyperdynamic function. The left ventricle has no regional  wall motion abnormalities. Left ventricular diastolic parameters are  consistent with Grade I diastolic  dysfunction (impaired relaxation). Elevated left atrial pressure.   2. Right ventricular systolic function is normal. The right ventricular  size is normal. There is mildly elevated pulmonary artery systolic  pressure.   3. The mitral valve is normal in structure. Mild mitral valve  regurgitation. No evidence of mitral stenosis.   4. The aortic valve is tricuspid. Aortic valve regurgitation is trivial.  Mild aortic valve sclerosis is present, with no evidence of aortic valve  stenosis.   5. The inferior vena cava is normal in size with greater than 50%  respiratory variability, suggesting right atrial pressure of 3 mmHg.   Carotid Artery Duplex: Date: 07/16/20 Results: Summary:  Right Carotid: Velocities in the right ICA are consistent with a 1-39%  stenosis.   Left Carotid: Velocities in the left ICA are consistent with a 40-59%  stenosis.   Vertebrals:  Right vertebral artery demonstrates antegrade flow. Left  vertebral               artery demonstrates retrograde flow, consistent with  subclavian               steal syndrome.  Subclavians: Left subclavian artery flow was disturbed, with dampened  monophasic               flow consistent with a more proximal stenosis or occlusion.  Normal               flow hemodynamics were seen in the right subclavian artery.               There is a 20 mmHg difference brachial pressure gradient,  with the               left arm being the lowest. The right brachial artery is  patent with                triphasic flow, the left brachial artery is patent with               bi/monophasic flow.    NonCardiac CT: Date: 07/08/20 Results: IMPRESSION: Vascular:  1. Severe stenosis of the proximal left  subclavian artery secondary to calcified atherosclerotic plaque. 2. Widely patent and normal appearing right brachiocephalic and right subclavian arteries. No evidence of aortic coarctation, as queried. 3.  Aortic Atherosclerosis  EKG:  EKG is  ordered today.  The ekg ordered today demonstrates no acute ischemic changes, normal sinus rhythm 84 bpm.  Recent Labs: 06/24/2022: ALT 9; BUN 21; Creat 1.26; Hemoglobin 12.8; Platelets 308; Potassium 4.8; Sodium 138  Recent Lipid Panel    Component Value Date/Time   CHOL 112 08/06/2021 0929   TRIG 146 08/06/2021 0929   HDL 39 (L) 08/06/2021 0929   CHOLHDL 2.9 08/06/2021 0929   LDLCALC 50 08/06/2021 0929     Home Medications   Current Meds  Medication Sig   Accu-Chek Softclix Lancets lancets Use to test blood sugar three times daily. Dx: E11.8   aspirin EC 81 MG tablet Take 81 mg by mouth daily.   baclofen (LIORESAL) 10 MG tablet Take 1 tablet (10 mg total) by mouth at bedtime as needed for muscle spasms.   Biotin 1 MG CAPS Take by mouth.   carvedilol (COREG) 6.25 MG tablet Take 1 tablet (6.25 mg total) by mouth 2 (two) times daily.   chlorthalidone (HYGROTON) 25 MG tablet Take 1 tablet (25 mg total) by mouth daily.   Cholecalciferol (VITAMIN D3) 50 MCG (2000 UT) capsule Take 2,000 Units by mouth daily.   Continuous Blood Gluc Receiver (FREESTYLE LIBRE 2 READER) DEVI    Continuous Blood Gluc Sensor (FREESTYLE LIBRE 2 SENSOR) MISC USE TO CHECK BLOOD SUGAR **CHANGE EVERY 14 DAYS**   Diclofenac Sodium (VOLTAREN EX) Apply 1 application topically 3 (three) times daily as needed.   estradiol (ESTRACE) 0.1 MG/GM vaginal cream Place 1 Applicatorful vaginally 3 (three) times a week.   etanercept (ENBREL SURECLICK) 50 MG/ML injection Inject  50 mg into the skin once a week.   fluocinonide (LIDEX) 0.05 % external solution Apply 1 Application topically daily as needed.   insulin degludec (TRESIBA FLEXTOUCH) 200 UNIT/ML FlexTouch Pen Inject 60 Units into the skin daily in the afternoon.   Insulin Pen Needle 31G X 5 MM MISC 1 Device by Does not apply route daily in the afternoon.   irbesartan (AVAPRO) 300 MG tablet Take 1 tablet (300 mg total) by mouth daily. Please call [phone] to schedule an appointment for July for future refills thank you.   loperamide (IMODIUM A-D) 2 MG tablet Take 2 mg by mouth as needed for diarrhea or loose stools.   LORazepam (ATIVAN) 0.5 MG tablet TAKE ONE TABLET BY MOUTH EVERY NIGHT AT BEDTIME   MELATONIN PO Take 10 mg by mouth at bedtime as needed.   nitroGLYCERIN (NITROSTAT) 0.4 MG SL tablet Place 1 tablet (0.4 mg total) under the tongue every 5 (five) minutes as needed for chest pain.   Omeprazole 20 MG TBDD Take 20 mg by mouth daily.   ondansetron (ZOFRAN) 4 MG tablet Take 1 tablet (4 mg total) by mouth 3 (three) times daily as needed for nausea or vomiting.   Probiotic Product (PROBIOTIC PO) Take by mouth daily.   Semaglutide,0.25 or 0.5MG /DOS, (OZEMPIC, 0.25 OR 0.5 MG/DOSE,) 2 MG/1.5ML SOPN Inject 0.5 mg into the skin once a week.   simvastatin (ZOCOR) 20 MG tablet TAKE ONE TABLET BY MOUTH EVERY EVENING     Review of Systems      All other systems reviewed and are otherwise negative except as noted above.  Physical Exam    VS:  BP (!) 140/52  Pulse 84   Ht  (1.549 m)   Wt 156 lb (70.8 kg)   SpO2 97%   BMI 29.48 kg/m  , BMI Body mass index is 29.48 kg/m.  Wt Readings from Last 3 Encounters:  10/14/22 156 lb (70.8 kg)  09/10/22 158 lb (71.7 kg)  08/05/22 159 lb 6.4 oz (72.3 kg)     GEN: Well nourished, well developed, in no acute distress. HEENT: normal. Neck: Supple, no JVD, carotid bruits, or masses. Cardiac: RRR, no murmurs, rubs, or gallops. No clubbing, cyanosis, edema.   Radials/PT 2+ and equal bilaterally.  Respiratory:  Respirations regular and unlabored, clear to auscultation bilaterally. GI: Soft, nontender, nondistended. MS: No deformity or atrophy. Skin: Warm and dry, no rash. Neuro:  Strength and sensation are intact. Psych: Normal affect.  Assessment & Plan    Coronary artery disease, nonobstructive FFR negative -Continue aspirin 81 mg daily, carvedilol 6.25 mg twice a day, Avapro 300 mg daily, nitro as needed, simvastatin 20 mg daily -New exertional chest pain that is relieved with nitroglycerin -plan for cardiac cath  Hyperlipidemia -needs an updated lipid panel when she is next in the office -For now, continue simvastatin 20 mg daily  Hypertension with diabetes -A1c is 7.8 -Working with PCP for better control -Continue current medications  Distant smoker  Subclavian stenosis-use right arm for BP -follow-up with Dr. Kirke Corin  The patient understands that risks include but are not limited to stroke (1 in 1000), death (1 in 1000), kidney failure [usually temporary] (1 in 500), bleeding (1 in 200), allergic reaction [possibly serious] (1 in 200), and agrees to proceed.       Disposition: Follow up 4 weeks with [name], MD or APP.  Signed, Sharlene Dory, PA-C 10/14/2022, 12:46 PM [facility]

## 2022-10-13 NOTE — Telephone Encounter (Signed)
Called pt in regards to CP.  Reports for the past week has had chest tightness/ squeezing that comes and goes.  Occurs mainly at night while lying down.  On Saturday took NTG X1 with relief; at that time BP elevated around 160/100 HR 120. Has to stop and rest while doing normal daily activities such as ironing clothes.  This is new for pt.  Has arthritis but does report pain that goes across back.  Reviewed cardiac meds pt reports takes all as directed.  Took BP while on phone 134/61-82. Scheduled OV with Tessa for 10/14/22 at 10:55 am.  Advised to go to ED if symptoms change or worsen.  Pt verbalizes understanding.

## 2022-10-13 NOTE — Progress Notes (Unsigned)
Office Visit    Patient Name: Annette Suarez Date of Encounter: 10/14/2022  PCP:  Octavia Heir, NP   [facility]  Cardiologist:  [name], MD  Advanced Practice Provider:  No care team member to display Electrophysiologist:  None 6}  HPI    Annette Suarez is a 82 y.o. female with a past medical history of diabetes, hypertension, hyperlipidemia, OSA not on CPAP who originally presented for evaluation 06/11/2020 presents today for follow-up visit.  In 2022 she had subclavian stenosis and aortic atherosclerosis seen by Dr. Lodi Sink for PAD.  Stopped Norvasc due to leg edema.  2023 she saw the DOD for chest pain, CCTA was ordered, FFR negative.  Blood pressure continued to improve.  Seen by Dr. Izora Ribas July 2023 and at that time she was doing well.  Her husband was doing most heavy lifting because of concerns that she will fall.  She did not have any interval hospital or ED visits.  No chest pain or pressure.  No SOB/DOE and no PND/orthopnea.  Coughing and had increased congestion that summer.  She had noted weight loss on Ozempic (only new medication).  No palpitations or syncope.  No leg swelling.  Today, she tells me that things have overall been going well however in the last week she has noticed some significant changes.  When she lays down at night and when she exerts herself she has a squeezing in her chest.  It hurts across her shoulders and in her back some as well.  She also has some indigestion with it.  Saturday night was the worst she had just finished doing some light housework and cook dinner.  She drank some ginger ale and had the pain.  She felt like if she could burp it would help.  She feels like she cannot do as much as she used to do and her exercise tolerance has decreased significantly.  She did take some nitro and it did help and she was able to fall asleep.  When now when she goes to the store and now she typically is out of breath which  is new for her.  We discussed a couple different options including a PET stress test and cardiac catheterization.Also discussed with Dr. Izora Ribas.   Reports no shortness of breath nor dyspnea on exertion.No edema, orthopnea, PND. Reports no palpitations.    Past Medical History    Past Medical History:  Diagnosis Date   Acute cystitis    UTI   Anxiety    Arthritis    Benign positional vertigo    Dermatitis    Diabetes mellitus without complication    Family history of adverse reaction to anesthesia    GERD (gastroesophageal reflux disease)    Goiter, nontoxic, multinodular    Gout    H/O: hysterectomy    History of tobacco use    Hx laparoscopic cholecystectomy    Hypercholesterolemia    Hypertension    Incomplete bladder emptying    Insomnia    Nausea & vomiting 09/08/2017   Postmenopausal atrophic vaginitis    RA (rheumatoid arthritis)    Rectocele    Renal calculus    Skin cancer    Sleep apnea    Thyroid condition    Type 2 diabetes mellitus    Urolithiasis    Vaginal enterocele    Past Surgical History:  Procedure Laterality Date   CARPAL TUNNEL RELEASE  2018   CATARACT EXTRACTION, BILATERAL  CHOLECYSTECTOMY     COLONOSCOPY     07/11/2009, 02/2015   GALLBLADDER SURGERY     KNEE ARTHROSCOPY Right 05/20/2020   Procedure: RIGHT KNEE ARTHROSCOPY AND DEBRIDEMENT;  Surgeon: Nadara Mustard, MD;  Location: [facility];  Service: Orthopedics;  Laterality: Right;   PARTIAL HYSTERECTOMY     Overies left intact   SQUAMOUS CELL CARCINOMA EXCISION  05/2018    Allergies  Allergies  Allergen Reactions   Amaryl [Glimepiride]     Don't Remember   Amlodipine     LE edema on 7.5mg  daily. Tolerates 5mg  daily just fine   Codeine     SICK   Jardiance [Empagliflozin]     Urinary tract infection- SIDE EFFECT   Magnesium Citrate     Strong urine odor and dysuria   Pneumovax 23 [Pneumococcal Vac Polyvalent]     Red / Swelling / Hot to touch SIDE  EFFECTS   Wellbutrin [Bupropion]     Unknown   Latex Rash     EKGs/Labs/Other Studies Reviewed:   The following studies were reviewed today:  Transthoracic Echocardiogram Date: 07/08/20 Results  1. Left ventricular ejection fraction, by estimation, is >75%. The left  ventricle has hyperdynamic function. The left ventricle has no regional  wall motion abnormalities. Left ventricular diastolic parameters are  consistent with Grade I diastolic  dysfunction (impaired relaxation). Elevated left atrial pressure.   2. Right ventricular systolic function is normal. The right ventricular  size is normal. There is mildly elevated pulmonary artery systolic  pressure.   3. The mitral valve is normal in structure. Mild mitral valve  regurgitation. No evidence of mitral stenosis.   4. The aortic valve is tricuspid. Aortic valve regurgitation is trivial.  Mild aortic valve sclerosis is present, with no evidence of aortic valve  stenosis.   5. The inferior vena cava is normal in size with greater than 50%  respiratory variability, suggesting right atrial pressure of 3 mmHg.   Carotid Artery Duplex: Date: 07/16/20 Results: Summary:  Right Carotid: Velocities in the right ICA are consistent with a 1-39%  stenosis.   Left Carotid: Velocities in the left ICA are consistent with a 40-59%  stenosis.   Vertebrals:  Right vertebral artery demonstrates antegrade flow. Left  vertebral               artery demonstrates retrograde flow, consistent with  subclavian               steal syndrome.  Subclavians: Left subclavian artery flow was disturbed, with dampened  monophasic               flow consistent with a more proximal stenosis or occlusion.  Normal               flow hemodynamics were seen in the right subclavian artery.               There is a 20 mmHg difference brachial pressure gradient,  with the               left arm being the lowest. The right brachial artery is  patent with                triphasic flow, the left brachial artery is patent with               bi/monophasic flow.    NonCardiac CT: Date: 07/08/20 Results: IMPRESSION: Vascular:  1. Severe stenosis of the proximal left  subclavian artery secondary to calcified atherosclerotic plaque. 2. Widely patent and normal appearing right brachiocephalic and right subclavian arteries. No evidence of aortic coarctation, as queried. 3.  Aortic Atherosclerosis  EKG:  EKG is  ordered today.  The ekg ordered today demonstrates no acute ischemic changes, normal sinus rhythm 84 bpm.  Recent Labs: 06/24/2022: ALT 9; BUN 21; Creat 1.26; Hemoglobin 12.8; Platelets 308; Potassium 4.8; Sodium 138  Recent Lipid Panel    Component Value Date/Time   CHOL 112 08/06/2021 0929   TRIG 146 08/06/2021 0929   HDL 39 (L) 08/06/2021 0929   CHOLHDL 2.9 08/06/2021 0929   LDLCALC 50 08/06/2021 0929     Home Medications   Current Meds  Medication Sig   Accu-Chek Softclix Lancets lancets Use to test blood sugar three times daily. Dx: E11.8   aspirin EC 81 MG tablet Take 81 mg by mouth daily.   baclofen (LIORESAL) 10 MG tablet Take 1 tablet (10 mg total) by mouth at bedtime as needed for muscle spasms.   Biotin 1 MG CAPS Take by mouth.   carvedilol (COREG) 6.25 MG tablet Take 1 tablet (6.25 mg total) by mouth 2 (two) times daily.   chlorthalidone (HYGROTON) 25 MG tablet Take 1 tablet (25 mg total) by mouth daily.   Cholecalciferol (VITAMIN D3) 50 MCG (2000 UT) capsule Take 2,000 Units by mouth daily.   Continuous Blood Gluc Receiver (FREESTYLE LIBRE 2 READER) DEVI    Continuous Blood Gluc Sensor (FREESTYLE LIBRE 2 SENSOR) MISC USE TO CHECK BLOOD SUGAR **CHANGE EVERY 14 DAYS**   Diclofenac Sodium (VOLTAREN EX) Apply 1 application topically 3 (three) times daily as needed.   estradiol (ESTRACE) 0.1 MG/GM vaginal cream Place 1 Applicatorful vaginally 3 (three) times a week.   etanercept (ENBREL SURECLICK) 50 MG/ML injection Inject  50 mg into the skin once a week.   fluocinonide (LIDEX) 0.05 % external solution Apply 1 Application topically daily as needed.   insulin degludec (TRESIBA FLEXTOUCH) 200 UNIT/ML FlexTouch Pen Inject 60 Units into the skin daily in the afternoon.   Insulin Pen Needle 31G X 5 MM MISC 1 Device by Does not apply route daily in the afternoon.   irbesartan (AVAPRO) 300 MG tablet Take 1 tablet (300 mg total) by mouth daily. Please call [phone] to schedule an appointment for July for future refills thank you.   loperamide (IMODIUM A-D) 2 MG tablet Take 2 mg by mouth as needed for diarrhea or loose stools.   LORazepam (ATIVAN) 0.5 MG tablet TAKE ONE TABLET BY MOUTH EVERY NIGHT AT BEDTIME   MELATONIN PO Take 10 mg by mouth at bedtime as needed.   nitroGLYCERIN (NITROSTAT) 0.4 MG SL tablet Place 1 tablet (0.4 mg total) under the tongue every 5 (five) minutes as needed for chest pain.   Omeprazole 20 MG TBDD Take 20 mg by mouth daily.   ondansetron (ZOFRAN) 4 MG tablet Take 1 tablet (4 mg total) by mouth 3 (three) times daily as needed for nausea or vomiting.   Probiotic Product (PROBIOTIC PO) Take by mouth daily.   Semaglutide,0.25 or 0.5MG /DOS, (OZEMPIC, 0.25 OR 0.5 MG/DOSE,) 2 MG/1.5ML SOPN Inject 0.5 mg into the skin once a week.   simvastatin (ZOCOR) 20 MG tablet TAKE ONE TABLET BY MOUTH EVERY EVENING     Review of Systems      All other systems reviewed and are otherwise negative except as noted above.  Physical Exam    VS:  BP (!) 140/52  Pulse 84   Ht 5\' 1"  (1.549 m)   Wt 156 lb (70.8 kg)   SpO2 97%   BMI 29.48 kg/m  , BMI Body mass index is 29.48 kg/m.  Wt Readings from Last 3 Encounters:  10/14/22 156 lb (70.8 kg)  09/10/22 158 lb (71.7 kg)  08/05/22 159 lb 6.4 oz (72.3 kg)     GEN: Well nourished, well developed, in no acute distress. HEENT: normal. Neck: Supple, no JVD, carotid bruits, or masses. Cardiac: RRR, no murmurs, rubs, or gallops. No clubbing, cyanosis, edema.   Radials/PT 2+ and equal bilaterally.  Respiratory:  Respirations regular and unlabored, clear to auscultation bilaterally. GI: Soft, nontender, nondistended. MS: No deformity or atrophy. Skin: Warm and dry, no rash. Neuro:  Strength and sensation are intact. Psych: Normal affect.  Assessment & Plan    Coronary artery disease, nonobstructive FFR negative -Continue aspirin 81 mg daily, carvedilol 6.25 mg twice a day, Avapro 300 mg daily, nitro as needed, simvastatin 20 mg daily -New exertional chest pain that is relieved with nitroglycerin -plan for cardiac cath  Hyperlipidemia -needs an updated lipid panel when she is next in the office -For now, continue simvastatin 20 mg daily  Hypertension with diabetes -A1c is 7.8 -Working with PCP for better control -Continue current medications  Distant smoker  Subclavian stenosis-use right arm for BP -follow-up with Dr. Kirke CorinArida  The patient understands that risks include but are not limited to stroke (1 in 1000), death (1 in 1000), kidney failure [usually temporary] (1 in 500), bleeding (1 in 200), allergic reaction [possibly serious] (1 in 200), and agrees to proceed.       Disposition: Follow up 4 weeks with Christell ConstantMahesh A Chandrasekhar, MD or APP.  Signed, Sharlene Doryessa N Laurajean Hosek, PA-C 10/14/2022, 12:46 PM [facility]

## 2022-10-13 NOTE — Telephone Encounter (Signed)
Pt c/o of Chest Pain: STAT if CP now or developed within 24 hours  1. Are you having CP right now? no  2. Are you experiencing any other symptoms (ex. SOB, nausea, vomiting, sweating)? Has had SOB when she walks from the parking lot to the store.  She states she is tired, has no energy.    3. How long have you been experiencing CP? For a week, she has had chest discomfort, chest tightness.   4. Is your CP continuous or coming and going? It comes and goes.  She said it mainly happens at night when she is laying down  5. Have you taken Nitroglycerin? Yes. She said she took one Saturday night and it did help. ?

## 2022-10-14 ENCOUNTER — Encounter: Payer: Self-pay | Admitting: Physician Assistant

## 2022-10-14 ENCOUNTER — Ambulatory Visit: Payer: Medicare Other | Attending: Physician Assistant | Admitting: Physician Assistant

## 2022-10-14 VITALS — BP 140/52 | HR 84 | Ht 61.0 in | Wt 156.0 lb

## 2022-10-14 DIAGNOSIS — I10 Essential (primary) hypertension: Secondary | ICD-10-CM

## 2022-10-14 DIAGNOSIS — I25118 Atherosclerotic heart disease of native coronary artery with other forms of angina pectoris: Secondary | ICD-10-CM

## 2022-10-14 DIAGNOSIS — I700 Atherosclerosis of aorta: Secondary | ICD-10-CM

## 2022-10-14 DIAGNOSIS — I2510 Atherosclerotic heart disease of native coronary artery without angina pectoris: Secondary | ICD-10-CM

## 2022-10-14 DIAGNOSIS — E1165 Type 2 diabetes mellitus with hyperglycemia: Secondary | ICD-10-CM

## 2022-10-14 DIAGNOSIS — Z87891 Personal history of nicotine dependence: Secondary | ICD-10-CM | POA: Diagnosis not present

## 2022-10-14 DIAGNOSIS — Z794 Long term (current) use of insulin: Secondary | ICD-10-CM | POA: Diagnosis not present

## 2022-10-14 DIAGNOSIS — E785 Hyperlipidemia, unspecified: Secondary | ICD-10-CM | POA: Diagnosis not present

## 2022-10-14 DIAGNOSIS — I771 Stricture of artery: Secondary | ICD-10-CM | POA: Diagnosis not present

## 2022-10-14 NOTE — Patient Instructions (Signed)
Medication Instructions:  Your physician recommends that you continue on your current medications as directed. Please refer to the Current Medication list given to you today.  *If you need a refill on your cardiac medications before your next appointment, please call your pharmacy*   Lab Work: CBC, BMP--TODAY If you have labs (blood work) drawn today and your tests are completely normal, you will receive your results only by: MyChart Message (if you have MyChart) OR A paper copy in the mail If you have any lab test that is abnormal or we need to change your treatment, we will call you to review the results.   Testing/Procedures:       Cardiac/Peripheral Catheterization   You are scheduled for a Cardiac Catheterization on Friday, April 19 with Dr. Lance Muss.  1. Please arrive at the Main Entrance A at [facility]: [address] [place], [place] [address] on April 11 at 8:00 AM (This time is two hours before your procedure to ensure your preparation). Free valet parking service is available. You will check in at ADMITTING. The support person will be asked to wait in the waiting room.  It is OK to have someone drop you off and come back when you are ready to be discharged.        Special note: Every effort is made to have your procedure done on time. Please understand that emergencies sometimes delay scheduled procedures.   . 2. Diet: Do not eat solid foods after midnight.  You may have clear liquids until 5 AM the day of the procedure.  3. Labs: You will need to have blood drawn--TODAY 4. Medication instructions in preparation for your procedure:   Contrast Allergy: No   Take only 1/2 dose of insulin the night before your procedure. Do not take any insulin on the day of the procedure.  Do not take metformin containing on the day of the procedure and HOLD 48 HOURS AFTER THE PROCEDURE.  Hold ozempic 1 week prior  On the morning of your procedure, take Aspirin 81  mg and any morning medicines NOT listed above.  You may use sips of water.  5. Plan to go home the same day, you will only stay overnight if medically necessary. 6. You MUST have a responsible adult to drive you home. 7. An adult MUST be with you the first 24 hours after you arrive home. 8. Bring a current list of your medications, and the last time and date medication taken. 9. Bring ID and current insurance cards. 10.Please wear clothes that are easy to get on and off and wear slip-on shoes.  Thank you for allowing [place] to care for you!   -- [facility]    Follow-Up: At [facility], you and your health needs are our priority.  As part of our continuing mission to provide you with exceptional heart care, we have created designated Provider Care Teams.  These Care Teams include your primary Cardiologist (physician) and Advanced Practice Providers (APPs -  Physician Assistants and Nurse Practitioners) who all work together to provide you with the care you need, when you need it.  We recommend signing up for the patient portal called "MyChart".  Sign up information is provided on this After Visit Summary.  MyChart is used to connect with patients for Virtual Visits (Telemedicine).  Patients are able to view lab/test results, encounter notes, upcoming appointments, etc.  Non-urgent messages can be sent to your provider as  well.   To learn more about what you can do with MyChart, go to [email or web address].    Your next appointment:   4-5 week(s)  Provider:   [name], MD  or APP  Other Instructions Check your blood pressure daily, 1-2 hours after taking your morning medications for 2 weeks, keep a log and call us with the readings at the end of the 2 weeks  Low-Sodium Eating Plan Sodium, which is an element that makes up salt, helps you maintain a healthy balance of fluids in your body. Too much sodium can increase your blood  pressure and cause fluid and waste to be held in your body. Your health care provider or dietitian may recommend following this plan if you have high blood pressure (hypertension), kidney disease, liver disease, or heart failure. Eating less sodium can help lower your blood pressure, reduce swelling, and protect your heart, liver, and kidneys. What are tips for following this plan? Reading food labels The Nutrition Facts label lists the amount of sodium in one serving of the food. If you eat more than one serving, you must multiply the listed amount of sodium by the number of servings. Choose foods with less than 140 mg of sodium per serving. Avoid foods with 300 mg of sodium or more per serving. Shopping  Look for lower-sodium products, often labeled as "low-sodium" or "no salt added." Always check the sodium content, even if foods are labeled as "unsalted" or "no salt added." Buy fresh foods. Avoid canned foods and pre-made or frozen meals. Avoid canned, cured, or processed meats. Buy breads that have less than 80 mg of sodium per slice. Cooking  Eat more home-cooked food and less restaurant, buffet, and fast food. Avoid adding salt when cooking. Use salt-free seasonings or herbs instead of table salt or sea salt. Check with your health care provider or pharmacist before using salt substitutes. Cook with plant-based oils, such as canola, sunflower, or olive oil. Meal planning When eating at a restaurant, ask that your food be prepared with less salt or no salt, if possible. Avoid dishes labeled as brined, pickled, cured, smoked, or made with soy sauce, miso, or teriyaki sauce. Avoid foods that contain MSG (monosodium glutamate). MSG is sometimes added to [place] food, bouillon, and some canned foods. Make meals that can be grilled, baked, poached, roasted, or steamed. These are generally made with less sodium. General information Most people on this plan should limit their sodium intake to  1,500-2,000 mg (milligrams) of sodium each day. What foods should I eat? Fruits Fresh, frozen, or canned fruit. Fruit juice. Vegetables Fresh or frozen vegetables. "No salt added" canned vegetables. "No salt added" tomato sauce and paste. Low-sodium or reduced-sodium tomato and vegetable juice. Grains Low-sodium cereals, including oats, puffed wheat and rice, and shredded wheat. Low-sodium crackers. Unsalted rice. Unsalted pasta. Low-sodium bread. Whole-grain breads and whole-grain pasta. Meats and other proteins Fresh or frozen (no salt added) meat, poultry, seafood, and fish. Low-sodium canned tuna and salmon. Unsalted nuts. Dried peas, beans, and lentils without added salt. Unsalted canned beans. Eggs. Unsalted nut butters. Dairy Milk. Soy milk. Cheese that is naturally low in sodium, such as ricotta cheese, fresh mozzarella, or Swiss cheese. Low-sodium or reduced-sodium cheese. Cream cheese. Yogurt. Seasonings and condiments Fresh and dried herbs and spices. Salt-free seasonings. Low-sodium mustard and ketchup. Sodium-free salad dressing. Sodium-free light mayonnaise. Fresh or refrigerated horseradish. Lemon juice. Vinegar. Other foods Homemade, reduced-sodium, or low-sodium soups. Unsalted popcorn and pretzels. Low-salt or  salt-free chips. The items listed above may not be a complete list of foods and beverages you can eat. Contact a dietitian for more information. What foods should I avoid? Vegetables Sauerkraut, pickled vegetables, and relishes. Olives. [place] fries. Onion rings. Regular canned vegetables (not low-sodium or reduced-sodium). Regular canned tomato sauce and paste (not low-sodium or reduced-sodium). Regular tomato and vegetable juice (not low-sodium or reduced-sodium). Frozen vegetables in sauces. Grains Instant hot cereals. Bread stuffing, pancake, and biscuit mixes. Croutons. Seasoned rice or pasta mixes. Noodle soup cups. Boxed or frozen macaroni and cheese. Regular  salted crackers. Self-rising flour. Meats and other proteins Meat or fish that is salted, canned, smoked, spiced, or pickled. Precooked or cured meat, such as sausages or meat loaves. Tomasa Suarez. Ham. Pepperoni. Hot dogs. Corned beef. Chipped beef. Salt pork. Jerky. Pickled herring. Anchovies and sardines. Regular canned tuna. Salted nuts. Dairy Processed cheese and cheese spreads. Hard cheeses. Cheese curds. Blue cheese. Feta cheese. String cheese. Regular cottage cheese. Buttermilk. Canned milk. Fats and oils Salted butter. Regular margarine. Ghee. Bacon fat. Seasonings and condiments Onion salt, garlic salt, seasoned salt, table salt, and sea salt. Canned and packaged gravies. Worcestershire sauce. Tartar sauce. Barbecue sauce. Teriyaki sauce. Soy sauce, including reduced-sodium. Steak sauce. Fish sauce. Oyster sauce. Cocktail sauce. Horseradish that you find on the shelf. Regular ketchup and mustard. Meat flavorings and tenderizers. Bouillon cubes. Hot sauce. Pre-made or packaged marinades. Pre-made or packaged taco seasonings. Relishes. Regular salad dressings. Salsa. Other foods Salted popcorn and pretzels. Corn chips and puffs. Potato and tortilla chips. Canned or dried soups. Pizza. Frozen entrees and pot pies. The items listed above may not be a complete list of foods and beverages you should avoid. Contact a dietitian for more information. Summary Eating less sodium can help lower your blood pressure, reduce swelling, and protect your heart, liver, and kidneys. Most people on this plan should limit their sodium intake to 1,500-2,000 mg (milligrams) of sodium each day. Canned, boxed, and frozen foods are high in sodium. Restaurant foods, fast foods, and pizza are also very high in sodium. You also get sodium by adding salt to food. Try to cook at home, eat more fresh fruits and vegetables, and eat less fast food and canned, processed, or prepared foods. This information is not intended to  replace advice given to you by your health care provider. Make sure you discuss any questions you have with your health care provider. Document Revised: 05/28/2019 Document Reviewed: 05/23/2019 Elsevier Patient Education  2023 [employer]  Heart-Healthy Eating Plan Many factors influence your heart health, including eating and exercise habits. Heart health is also called coronary health. Coronary risk increases with abnormal blood fat (lipid) levels. A heart-healthy eating plan includes limiting unhealthy fats, increasing healthy fats, limiting salt (sodium) intake, and making other diet and lifestyle changes. What is my plan? Your health care provider may recommend that: You limit your fat intake to _________% or less of your total calories each day. You limit your saturated fat intake to _________% or less of your total calories each day. You limit the amount of cholesterol in your diet to less than _________ mg per day. You limit the amount of sodium in your diet to less than _________ mg per day. What are tips for following this plan? Cooking Cook foods using methods other than frying. Baking, boiling, grilling, and broiling are all good options. Other ways to reduce fat include: Removing the skin from poultry. Removing all visible fats from meats. Steaming  vegetables in water or broth. Meal planning  At meals, imagine dividing your plate into fourths: Fill one-half of your plate with vegetables and green salads. Fill one-fourth of your plate with whole grains. Fill one-fourth of your plate with lean protein foods. Eat 2-4 cups of vegetables per day. One cup of vegetables equals 1 cup (91 g) broccoli or cauliflower florets, 2 medium carrots, 1 large bell pepper, 1 large sweet potato, 1 large tomato, 1 medium white potato, 2 cups (150 g) raw leafy greens. Eat 1-2 cups of fruit per day. One cup of fruit equals 1 small apple, 1 large banana, 1 cup (237 g) mixed fruit, 1 large orange,   cup (82 g) dried fruit, 1 cup (240 mL) 100% fruit juice. Eat more foods that contain soluble fiber. Examples include apples, broccoli, carrots, beans, peas, and barley. Aim to get 25-30 g of fiber per day. Increase your consumption of legumes, nuts, and seeds to 4-5 servings per week. One serving of dried beans or legumes equals  cup (90 g) cooked, 1 serving of nuts is  oz (12 almonds, 24 pistachios, or 7 walnut halves), and 1 serving of seeds equals  oz (8 g). Fats Choose healthy fats more often. Choose monounsaturated and polyunsaturated fats, such as olive and canola oils, avocado oil, flaxseeds, walnuts, almonds, and seeds. Eat more omega-3 fats. Choose salmon, mackerel, sardines, tuna, flaxseed oil, and ground flaxseeds. Aim to eat fish at least 2 times each week. Check food labels carefully to identify foods with trans fats or high amounts of saturated fat. Limit saturated fats. These are found in animal products, such as meats, butter, and cream. Plant sources of saturated fats include palm oil, palm kernel oil, and coconut oil. Avoid foods with partially hydrogenated oils in them. These contain trans fats. Examples are stick margarine, some tub margarines, cookies, crackers, and other baked goods. Avoid fried foods. General information Eat more home-cooked food and less restaurant, buffet, and fast food. Limit or avoid alcohol. Limit foods that are high in added sugar and simple starches such as foods made using white refined flour (white breads, pastries, sweets). Lose weight if you are overweight. Losing just 5-10% of your body weight can help your overall health and prevent diseases such as diabetes and heart disease. Monitor your sodium intake, especially if you have high blood pressure. Talk with your health care provider about your sodium intake. Try to incorporate more vegetarian meals weekly. What foods should I eat? Fruits All fresh, canned (in natural juice), or frozen  fruits. Vegetables Fresh or frozen vegetables (raw, steamed, roasted, or grilled). Green salads. Grains Most grains. Choose whole wheat and whole grains most of the time. Rice and pasta, including brown rice and pastas made with whole wheat. Meats and other proteins Lean, well-trimmed beef, veal, pork, and lamb. Chicken and [place] without skin. All fish and shellfish. Wild duck, rabbit, pheasant, and venison. Egg whites or low-cholesterol egg substitutes. Dried beans, peas, lentils, and tofu. Seeds and most nuts. Dairy Low-fat or nonfat cheeses, including ricotta and mozzarella. Skim or 1% milk (liquid, powdered, or evaporated). Buttermilk made with low-fat milk. Nonfat or low-fat yogurt. Fats and oils Non-hydrogenated (trans-free) margarines. Vegetable oils, including soybean, sesame, sunflower, olive, avocado, peanut, safflower, corn, canola, and cottonseed. Salad dressings or mayonnaise made with a vegetable oil. Beverages Water (mineral or sparkling). Coffee and tea. Unsweetened ice tea. Diet beverages. Sweets and desserts Sherbet, gelatin, and fruit ice. Small amounts of dark chocolate. Limit all sweets  and desserts. Seasonings and condiments All seasonings and condiments. The items listed above may not be a complete list of foods and beverages you can eat. Contact a dietitian for more options. What foods should I avoid? Fruits Canned fruit in heavy syrup. Fruit in cream or butter sauce. Fried fruit. Limit coconut. Vegetables Vegetables cooked in cheese, cream, or butter sauce. Fried vegetables. Grains Breads made with saturated or trans fats, oils, or whole milk. Croissants. Sweet rolls. Donuts. High-fat crackers, such as cheese crackers and chips. Meats and other proteins Fatty meats, such as hot dogs, ribs, sausage, bacon, rib-eye roast or steak. High-fat deli meats, such as salami and bologna. Caviar. Domestic duck and goose. Organ meats, such as liver. Dairy Cream, sour cream,  cream cheese, and creamed cottage cheese. Whole-milk cheeses. Whole or 2% milk (liquid, evaporated, or condensed). Whole buttermilk. Cream sauce or high-fat cheese sauce. Whole-milk yogurt. Fats and oils Meat fat, or shortening. Cocoa butter, hydrogenated oils, palm oil, coconut oil, palm kernel oil. Solid fats and shortenings, including bacon fat, salt pork, lard, and butter. Nondairy cream substitutes. Salad dressings with cheese or sour cream. Beverages Regular sodas and any drinks with added sugar. Sweets and desserts Frosting. Pudding. Cookies. Cakes. Pies. Milk chocolate or white chocolate. Buttered syrups. Full-fat ice cream or ice cream drinks. The items listed above may not be a complete list of foods and beverages to avoid. Contact a dietitian for more information. Summary Heart-healthy meal planning includes limiting unhealthy fats, increasing healthy fats, limiting salt (sodium) intake and making other diet and lifestyle changes. Lose weight if you are overweight. Losing just 5-10% of your body weight can help your overall health and prevent diseases such as diabetes and heart disease. Focus on eating a balance of foods, including fruits and vegetables, low-fat or nonfat dairy, lean protein, nuts and legumes, whole grains, and heart-healthy oils and fats. This information is not intended to replace advice given to you by your health care provider. Make sure you discuss any questions you have with your health care provider. Document Revised: 07/27/2021 Document Reviewed: 07/27/2021 Elsevier Patient Education  2023 [employer].

## 2022-10-15 LAB — BASIC METABOLIC PANEL
BUN/Creatinine Ratio: 26 (ref 12–28)
BUN: 30 mg/dL — ABNORMAL HIGH (ref 8–27)
CO2: 27 mmol/L (ref 20–29)
Calcium: 9.5 mg/dL (ref 8.7–10.3)
Chloride: 99 mmol/L (ref 96–106)
Creatinine, Ser: 1.17 mg/dL — ABNORMAL HIGH (ref 0.57–1.00)
Glucose: 156 mg/dL — ABNORMAL HIGH (ref 70–99)
Potassium: 4.3 mmol/L (ref 3.5–5.2)
Sodium: 140 mmol/L (ref 134–144)
eGFR: 47 mL/min/{1.73_m2} — ABNORMAL LOW (ref >59)

## 2022-10-15 LAB — CBC
Hematocrit: 39.2 % (ref 34.0–46.6)
Hemoglobin: 13.3 g/dL (ref 11.1–15.9)
MCH: 31.7 pg (ref 26.6–33.0)
MCHC: 33.9 g/dL (ref 31.5–35.7)
MCV: 94 fL (ref 79–97)
Platelets: 330 10*3/uL (ref 150–450)
RBC: 4.19 x10E6/uL (ref 3.77–5.28)
RDW: 12.9 % (ref 11.7–15.4)
WBC: 11.5 10*3/uL — ABNORMAL HIGH (ref 3.4–10.8)

## 2022-10-19 DIAGNOSIS — L900 Lichen sclerosus et atrophicus: Secondary | ICD-10-CM | POA: Diagnosis not present

## 2022-10-19 DIAGNOSIS — R8271 Bacteriuria: Secondary | ICD-10-CM | POA: Diagnosis not present

## 2022-10-19 DIAGNOSIS — N3000 Acute cystitis without hematuria: Secondary | ICD-10-CM | POA: Diagnosis not present

## 2022-10-21 ENCOUNTER — Encounter: Payer: Self-pay | Admitting: Orthopedic Surgery

## 2022-10-21 ENCOUNTER — Ambulatory Visit (INDEPENDENT_AMBULATORY_CARE_PROVIDER_SITE_OTHER): Payer: Medicare Other | Admitting: Orthopedic Surgery

## 2022-10-21 ENCOUNTER — Telehealth: Payer: Self-pay | Admitting: *Deleted

## 2022-10-21 VITALS — BP 118/80 | HR 81 | Temp 97.5°F | Resp 16 | Ht 61.0 in | Wt 157.8 lb

## 2022-10-21 DIAGNOSIS — I152 Hypertension secondary to endocrine disorders: Secondary | ICD-10-CM

## 2022-10-21 DIAGNOSIS — N1831 Chronic kidney disease, stage 3a: Secondary | ICD-10-CM | POA: Diagnosis not present

## 2022-10-21 DIAGNOSIS — M0609 Rheumatoid arthritis without rheumatoid factor, multiple sites: Secondary | ICD-10-CM | POA: Diagnosis not present

## 2022-10-21 DIAGNOSIS — E1169 Type 2 diabetes mellitus with other specified complication: Secondary | ICD-10-CM | POA: Diagnosis not present

## 2022-10-21 DIAGNOSIS — I25118 Atherosclerotic heart disease of native coronary artery with other forms of angina pectoris: Secondary | ICD-10-CM | POA: Diagnosis not present

## 2022-10-21 DIAGNOSIS — E785 Hyperlipidemia, unspecified: Secondary | ICD-10-CM | POA: Diagnosis not present

## 2022-10-21 DIAGNOSIS — E118 Type 2 diabetes mellitus with unspecified complications: Secondary | ICD-10-CM | POA: Diagnosis not present

## 2022-10-21 DIAGNOSIS — E1159 Type 2 diabetes mellitus with other circulatory complications: Secondary | ICD-10-CM

## 2022-10-21 DIAGNOSIS — Z794 Long term (current) use of insulin: Secondary | ICD-10-CM

## 2022-10-21 DIAGNOSIS — F419 Anxiety disorder, unspecified: Secondary | ICD-10-CM

## 2022-10-21 NOTE — Patient Instructions (Signed)
Magnesium glycinate is better than citrate (citrate is good bowels)  Consider yellow mustard to prevent muscle cramping

## 2022-10-21 NOTE — Telephone Encounter (Signed)
Cardiac Catheterization scheduled at [facility] for: Friday October 22, 2022 10:30 AM Arrival time [facility] Main Entrance A at: 8:30 AM  Nothing to eat after midnight prior to procedure, clear liquids until 5 AM day of procedure.  Medication instructions: -Hold:  Chlorthalidone-day before and day of procedure-per protocol GFR 47-already taken today  Irbesartan-day before and day of procedure -per protocol GFR 47-already taken today Tresiba-1/2 usual dose HS prior to procedure  Ozempic-weekly on Tuesdays-did not take 10/19/22-will hold until post procedure -Other usual morning medications can be taken with sips of water including aspirin 81 mg.  Confirmed patient has responsible adult to drive home post procedure and be with patient first 24 hours after arriving home.  Plan to go home the same day, you will only stay overnight if medically necessary.  Reviewed procedure instructions with patient.

## 2022-10-21 NOTE — Progress Notes (Deleted)
Office Visit Note  Patient: Annette Suarez             Date of Birth: [birth year]           MRN: [id number]             PCP: Octavia Heir, NP Referring: Octavia Heir, NP Visit Date: 11/03/2022 Occupation: @  Subjective:  No chief complaint on file.   History of Present Illness: Annette SPAN is a 82 y.o. female ***     Activities of Daily Living:  Patient reports morning stiffness for *** {minute/hour:19697}.   Patient {ACTIONS;DENIES/REPORTS:21021675::"Denies"} nocturnal pain.  Difficulty dressing/grooming: {ACTIONS;DENIES/REPORTS:21021675::"Denies"} Difficulty climbing stairs: {ACTIONS;DENIES/REPORTS:21021675::"Denies"} Difficulty getting out of chair: {ACTIONS;DENIES/REPORTS:21021675::"Denies"} Difficulty using hands for taps, buttons, cutlery, and/or writing: {ACTIONS;DENIES/REPORTS:21021675::"Denies"}  No Rheumatology ROS completed.   PMFS History:  Patient Active Problem List   Diagnosis Date Noted   Coronary artery disease of native artery of native heart with stable angina pectoris 01/27/2022   Hyperlipidemia associated with type 2 diabetes mellitus 01/27/2022   Diabetes mellitus with coincident hypertension 08/13/2021   Type 2 diabetes mellitus with stage 3a chronic kidney disease, with long-term current use of insulin 08/13/2021   Multinodular goiter 08/13/2021   Subclavian arterial stenosis 09/11/2020   Aortic atherosclerosis 09/11/2020   Alteration in blood pressure 06/11/2020   Old complex tear of medial meniscus of right knee    Old complex tear of lateral meniscus of right knee    Primary osteoarthritis of both feet 12/13/2017   Dehydration 09/09/2017   Acute lower UTI 09/09/2017   Nausea & vomiting 09/08/2017   Primary osteoarthritis of both hands 04/05/2017   Left carpal tunnel syndrome 11/15/2016   Rheumatoid arthritis of multiple sites with negative rheumatoid factor 10/06/2016   High risk medication use 10/06/2016   Dyslipidemia 10/06/2016    Pain in both hands 09/08/2016   Hypertension associated with diabetes 09/08/2016   Type 2 diabetes mellitus  09/08/2016   Gastroesophageal reflux disease without esophagitis 09/08/2016   Osteopenia of multiple sites 09/08/2016   History of anxiety 09/08/2016   Benign paroxysmal positional vertigo 09/08/2016   Sleep apnea 09/08/2016    Past Medical History:  Diagnosis Date   Acute cystitis    UTI   Anxiety    Arthritis    Benign positional vertigo    Dermatitis    Diabetes mellitus without complication    Family history of adverse reaction to anesthesia    GERD (gastroesophageal reflux disease)    Goiter, nontoxic, multinodular    Gout    H/O: hysterectomy    History of tobacco use    Hx laparoscopic cholecystectomy    Hypercholesterolemia    Hypertension    Incomplete bladder emptying    Insomnia    Nausea & vomiting 09/08/2017   Postmenopausal atrophic vaginitis    RA (rheumatoid arthritis)    Rectocele    Renal calculus    Skin cancer    Sleep apnea    Thyroid condition    Type 2 diabetes mellitus    Urolithiasis    Vaginal enterocele     Family History  Problem Relation Age of Onset   Diabetes Mother    Heart disease Father 75   Diabetes Son    Crohn's disease Son    Past Surgical History:  Procedure Laterality Date   CARPAL TUNNEL RELEASE  2018   CATARACT EXTRACTION, BILATERAL     CHOLECYSTECTOMY     COLONOSCOPY  07/11/2009, 02/2015   GALLBLADDER SURGERY     KNEE ARTHROSCOPY Right 05/20/2020   Procedure: RIGHT KNEE ARTHROSCOPY AND DEBRIDEMENT;  Surgeon: Nadara Mustard, MD;  Location: [facility];  Service: Orthopedics;  Laterality: Right;   PARTIAL HYSTERECTOMY     Overies left intact   SQUAMOUS CELL CARCINOMA EXCISION  05/2018   Social History   Social History Narrative   Diet: Blank      Do you drink/ eat things with caffeine? Yes      Marital status:  Married                             What year were you married ? 196       Do you live in a house, apartment,assistred living, condo, trailer, etc.)? House      Is it one or more stories? 1      How many persons live in your home ? 2      Do you have any pets in your home ?(please list)  1 Sam-Dog      Highest Level of education completed: 12      Current or past profession: Office      Do you exercise?   Some                           Type & how often  Walk Daily      ADVANCED DIRECTIVES (Please bring copies)      Do you have a living will? Blank      Do you have a DNR form?  Blank                     If not, do you want to discuss one?       Do you have signed POA?HPOA forms?  Yes               If so, please bring to your appointment      FUNCTIONAL STATUS- To be completed by Spouse / child / Staff       Do you have difficulty bathing or dressing yourself ?  No      Do you have difficulty preparing food or eating ?  No      Do you have difficulty managing your mediation ?  No      Do you have difficulty managing your finances ?  No      Do you have difficulty affording your medication ?  Yes (Some)      Immunization History  Administered Date(s) Administered   Fluad Quad(high Dose 65+) 04/09/2021   Influenza Split 03/28/2008, 03/05/2010, 04/17/2012, 04/20/2013   Influenza, High Dose Seasonal PF 02/08/2020, 05/04/2022   Influenza-Unspecified 05/11/2011, 04/22/2014, 04/15/2015, 02/20/2016, 03/02/2017, 03/09/2018, 03/20/2019, 03/21/2020   PFIZER(Purple Top)SARS-COV-2 Vaccination 07/24/2019, 08/06/2019, 08/27/2019, 04/21/2020   Pneumococcal Conjugate-13 05/02/2014   Pneumococcal Polysaccharide-23 04/23/2008   Tdap 05/01/2013   Zoster, Live 04/17/2012     Objective: Vital Signs: There were no vitals taken for this visit.   Physical Exam   Musculoskeletal Exam: ***  CDAI Exam: CDAI Score: -- Patient Global: --; Provider Global: -- Swollen: --; Tender: -- Joint Exam 11/03/2022   No joint exam has been documented for this visit    There is currently no information documented on the homunculus. Go to the Rheumatology activity and complete the homunculus joint exam.  Investigation: No additional findings.  Imaging: No results found.  Recent Labs: Lab Results  Component Value Date   WBC 11.5 (H) 10/14/2022   HGB 13.3 10/14/2022   PLT 330 10/14/2022   NA 140 10/14/2022   K 4.3 10/14/2022   CL 99 10/14/2022   CO2 27 10/14/2022   GLUCOSE 156 (H) 10/14/2022   BUN 30 (H) 10/14/2022   CREATININE 1.17 (H) 10/14/2022   BILITOT 1.0 06/24/2022   ALKPHOS 83 07/25/2019   AST 10 06/24/2022   ALT 9 06/24/2022   PROT 6.5 06/24/2022   ALBUMIN 4.6 07/25/2019   CALCIUM 9.5 10/14/2022   GFRAA 43 (L) 01/09/2021   QFTBGOLDPLUS NEGATIVE 06/24/2022    Speciality Comments: Recieves Enbrel through [employer] [facility] 02/20/2019 Osteopenia managed by Dr. Eli Phillips 02/20/2019  Procedures:  No procedures performed Allergies: Latex, Amaryl [glimepiride], Amlodipine, Codeine, Jardiance [empagliflozin], Magnesium citrate, Pneumovax 23 [pneumococcal vac polyvalent], and Wellbutrin [bupropion]   Assessment / Plan:     Visit Diagnoses: No diagnosis found.  Orders: No orders of the defined types were placed in this encounter.  No orders of the defined types were placed in this encounter.   Face-to-face time spent with patient was *** minutes. Greater than 50% of time was spent in counseling and coordination of care.  Follow-Up Instructions: No follow-ups on file.   Ellen Henri, CMA  Note - This record has been created using Animal nutritionist.  Chart creation errors have been sought, but may not always  have been located. Such creation errors do not reflect on  the standard of medical care.

## 2022-10-21 NOTE — Progress Notes (Signed)
Careteam: Patient Care Team: Octavia Heir, NP as PCP - General (Adult Health Nurse Practitioner) [name], MD as PCP - Cardiology (Cardiology) [name], OD (Optometry)  Seen by: Hazle Nordmann, AGNP-C  PLACE OF SERVICE:  [facility]  Advanced Directive information Does Patient Have a Medical Advance Directive?: Yes, Type of Advance Directive: Healthcare Power of Attorney, Does patient want to make changes to medical advance directive?: No - Patient declined  Allergies  Allergen Reactions   Latex Rash   Amaryl [Glimepiride]     Don't Remember   Amlodipine     LE edema on 7.5mg  daily. Tolerates 5mg  daily just fine   Codeine     SICK   Jardiance [Empagliflozin]     Urinary tract infection- SIDE EFFECT   Magnesium Citrate Other (See Comments)    Strong urine odor and dysuria   Pneumovax 23 [Pneumococcal Vac Polyvalent]     Red / Swelling / Hot to touch SIDE EFFECTS   Wellbutrin [Bupropion]     Unknown    Chief Complaint  Patient presents with   Medical Management of Chronic Issues    9 month follow up.    Health Maintenance    Discuss the need for Eye exam.      HPI: Patient is a 82 y.o. female seen today for medical management of chronic conditions.   Scheduled to have right/left heart catheterization with coronary angiography  tomorrow. She has been short of breath with long distance walking. She is also having intermittent chest pain that extends to shoulder. She has had to take nitroglycerin once and chest pain stopped.   A1c 7.8. Likes [employer]. Sugars averaging 90-240's. No hypoglycemias. She remains on [place] and Ozempic.   Diabetic Eye Exam> followed by [name] at [facility] Last encounter requested.   Anxiety- no recent panic attacks, she will take prn at night to sleep with anxiety/sleep   Review of Systems:  Review of Systems  Constitutional:  Negative for chills and fever.  HENT:  Negative for congestion.    Eyes:  Negative for blurred vision.  Respiratory:  Negative for cough, shortness of breath and wheezing.   Cardiovascular:  Positive for chest pain. Negative for leg swelling.  Gastrointestinal:  Negative for abdominal pain and blood in stool.  Genitourinary:  Negative for dysuria and hematuria.  Musculoskeletal:  Positive for joint pain. Negative for falls.  Skin:  Negative for rash.  Neurological:  Negative for dizziness, weakness and headaches.  Psychiatric/Behavioral:  Negative for depression and memory loss. The patient is nervous/anxious and has insomnia.     Past Medical History:  Diagnosis Date   Acute cystitis    UTI   Anxiety    Arthritis    Benign positional vertigo    Dermatitis    Diabetes mellitus without complication    Family history of adverse reaction to anesthesia    GERD (gastroesophageal reflux disease)    Goiter, nontoxic, multinodular    Gout    H/O: hysterectomy    History of tobacco use    Hx laparoscopic cholecystectomy    Hypercholesterolemia    Hypertension    Incomplete bladder emptying    Insomnia    Nausea & vomiting 09/08/2017   Postmenopausal atrophic vaginitis    RA (rheumatoid arthritis)    Rectocele    Renal calculus    Skin cancer    Sleep apnea    Thyroid condition    Type 2 diabetes  mellitus    Urolithiasis    Vaginal enterocele    Past Surgical History:  Procedure Laterality Date   CARPAL TUNNEL RELEASE  2018   CATARACT EXTRACTION, BILATERAL     CHOLECYSTECTOMY     COLONOSCOPY     07/11/2009, 02/2015   GALLBLADDER SURGERY     KNEE ARTHROSCOPY Right 05/20/2020   Procedure: RIGHT KNEE ARTHROSCOPY AND DEBRIDEMENT;  Surgeon: Nadara Mustard, MD;  Location: [facility];  Service: Orthopedics;  Laterality: Right;   PARTIAL HYSTERECTOMY     Overies left intact   SQUAMOUS CELL CARCINOMA EXCISION  05/2018   Social History:   reports that she quit smoking about 44 years ago. Her smoking use included cigarettes. She  has a 2.00 pack-year smoking history. She has been exposed to tobacco smoke. She has never used smokeless tobacco. She reports that she does not drink alcohol and does not use drugs.  Family History  Problem Relation Age of Onset   Diabetes Mother    Heart disease Father 36   Diabetes Son    Crohn's disease Son     Medications: Patient's Medications  New Prescriptions   No medications on file  Previous Medications   ACCU-CHEK SOFTCLIX LANCETS LANCETS    Use to test blood sugar three times daily. Dx: E11.8   ASPIRIN EC 81 MG TABLET    Take 81 mg by mouth daily.   BACLOFEN (LIORESAL) 10 MG TABLET    Take 1 tablet (10 mg total) by mouth at bedtime as needed for muscle spasms.   BIOTIN 1 MG CAPS    Take 1 mg by mouth.   CARVEDILOL (COREG) 6.25 MG TABLET    Take 1 tablet (6.25 mg total) by mouth 2 (two) times daily.   CHLORTHALIDONE (HYGROTON) 25 MG TABLET    Take 1 tablet (25 mg total) by mouth daily.   CHOLECALCIFEROL (VITAMIN D3) 50 MCG (2000 UT) CAPSULE    Take 2,000 Units by mouth daily.   CLOBETASOL CREAM (TEMOVATE) 0.05 %    Apply 1 Application topically 2 (two) times daily.   CONTINUOUS BLOOD GLUC RECEIVER (FREESTYLE LIBRE 2 READER) DEVI       CONTINUOUS BLOOD GLUC SENSOR (FREESTYLE LIBRE 2 SENSOR) MISC    USE TO CHECK BLOOD SUGAR **CHANGE EVERY 14 DAYS**   CRANBERRY EXTRACT PO    Take 650 mg by mouth daily.   DICLOFENAC SODIUM (VOLTAREN EX)    Apply 1 application  topically 3 (three) times daily as needed (Arthritis).   ESTRADIOL (ESTRACE) 0.1 MG/GM VAGINAL CREAM    Place 1 Applicatorful vaginally 3 (three) times a week.   ETANERCEPT (ENBREL SURECLICK) 50 MG/ML INJECTION    Inject 50 mg into the skin once a week.   FLUOCINONIDE (LIDEX) 0.05 % EXTERNAL SOLUTION    Apply 1 Application topically daily as needed.   INSULIN DEGLUDEC (TRESIBA FLEXTOUCH) 200 UNIT/ML FLEXTOUCH PEN    Inject 60 Units into the skin daily in the afternoon.   INSULIN PEN NEEDLE 31G X 5 MM MISC    1 Device by  Does not apply route daily in the afternoon.   IRBESARTAN (AVAPRO) 300 MG TABLET    Take 1 tablet (300 mg total) by mouth daily. Please call [phone] to schedule an appointment for July for future refills thank you.   LOPERAMIDE (IMODIUM A-D) 2 MG TABLET    Take 2 mg by mouth daily as needed for diarrhea or loose stools.   LORAZEPAM (ATIVAN)  0.5 MG TABLET    TAKE ONE TABLET BY MOUTH EVERY NIGHT AT BEDTIME   MAGNESIUM CITRATE PO    Take 250 mg by mouth daily as needed (leg cramps).   MELATONIN PO    Take 10 mg by mouth at bedtime as needed (Sleep).   MULTIPLE VITAMINS-MINERALS (HAIR SKIN & NAILS PO)    Take 1 tablet by mouth 2 (two) times daily.   NITROGLYCERIN (NITROSTAT) 0.4 MG SL TABLET    Place 1 tablet (0.4 mg total) under the tongue every 5 (five) minutes as needed for chest pain.   OMEPRAZOLE 20 MG TBDD    Take 20 mg by mouth daily.   ONDANSETRON (ZOFRAN) 4 MG TABLET    Take 1 tablet (4 mg total) by mouth 3 (three) times daily as needed for nausea or vomiting.   PROBIOTIC PRODUCT (PROBIOTIC PO)    Take 1 tablet by mouth daily.   PROPYLENE GLYCOL (SYSTANE COMPLETE OP)    Place 1-2 drops into both eyes daily as needed (Dry eye).   SEMAGLUTIDE,0.25 OR 0.5MG /DOS, (OZEMPIC, 0.25 OR 0.5 MG/DOSE,) 2 MG/1.5ML SOPN    Inject 0.5 mg into the skin once a week.   SIMVASTATIN (ZOCOR) 20 MG TABLET    TAKE ONE TABLET BY MOUTH EVERY EVENING  Modified Medications   No medications on file  Discontinued Medications   No medications on file    Physical Exam:  Vitals:   10/21/22 1007  BP: 118/80  Pulse: 81  Resp: 16  Temp: (!) 97.5 F (36.4 C)  SpO2: 97%  Weight: 157 lb 12.8 oz (71.6 kg)  Height:  (1.549 m)   Body mass index is 29.82 kg/m. Wt Readings from Last 3 Encounters:  10/21/22 157 lb 12.8 oz (71.6 kg)  10/14/22 156 lb (70.8 kg)  09/10/22 158 lb (71.7 kg)    Physical Exam Vitals reviewed.  Constitutional:      General: She is not in acute distress. HENT:     Head:  Normocephalic.     Right Ear: There is no impacted cerumen.     Left Ear: There is no impacted cerumen.     Nose: Nose normal.     Mouth/Throat:     Mouth: Mucous membranes are moist.  Eyes:     General:        Right eye: No discharge.        Left eye: No discharge.  Cardiovascular:     Rate and Rhythm: Normal rate and regular rhythm.     Pulses: Normal pulses.     Heart sounds: Normal heart sounds.  Pulmonary:     Effort: Pulmonary effort is normal. No respiratory distress.     Breath sounds: Normal breath sounds. No wheezing.  Abdominal:     General: Bowel sounds are normal. There is no distension.     Palpations: Abdomen is soft.     Tenderness: There is no abdominal tenderness.  Musculoskeletal:     Cervical back: Neck supple.     Right lower leg: No edema.     Left lower leg: No edema.  Skin:    General: Skin is warm.     Capillary Refill: Capillary refill takes less than 2 seconds.  Neurological:     General: No focal deficit present.     Mental Status: She is alert and oriented to person, place, and time.  Psychiatric:        Mood and Affect: Mood normal.  Behavior: Behavior normal.     Labs reviewed: Basic Metabolic Panel: Recent Labs    04/01/22 0931 06/24/22 1133 10/14/22 1238  NA 140 138 140  K 4.5 4.8 4.3  CL 103 101 99  CO2 GLUCOSE 71 198* 156*  BUN 25 21 30*  CREATININE 1.06* 1.26* 1.17*  CALCIUM 9.4 9.3 9.5   Liver Function Tests: Recent Labs    12/30/21 1109 03/29/22 1455 06/24/22 1133  AST ALT BILITOT 1.4* 1.2 1.0  PROT 6.7 6.7 6.5   No results for input(s): "LIPASE", "AMYLASE" in the last 8760 hours. No results for input(s): "AMMONIA" in the last 8760 hours. CBC: Recent Labs    03/29/22 1455 04/01/22 0931 06/24/22 1133 10/14/22 1238  WBC 11.4* 9.1 10.3 11.5*  NEUTROABS 7,661 5,997 7,086  --   HGB 12.9 13.0 12.8 13.3  HCT 37.1 37.8 37.3 39.2  MCV 92.8 91.5 93.3 94  PLT 302 289 308 330    Lipid Panel: No results for input(s): "CHOL", "HDL", "LDLCALC", "TRIG", "CHOLHDL", "LDLDIRECT" in the last 8760 hours. TSH: No results for input(s): "TSH" in the last 8760 hours. A1C: Lab Results  Component Value Date   HGBA1C 7.8 (A) 09/10/2022     Assessment/Plan 1. Type 2 diabetes mellitus with complication, with long-term current use of insulin - followed by endocrinology - A1c 7.8 - no hypoglycemias - cont Tresiba and Ozempic - diabetic eye exam [facility] from [facility]   2. Rheumatoid arthritis of multiple sites with negative rheumatoid factor - followed by rheumatology - remains on Enbrel  3. Stage 3a chronic kidney disease - avoid nephrotoxic drugs like NSAIDS and dose adjust medications to be renally excreted - encourage hydration with water   4. Hypertension associated with diabetes - controlled - cont carvedilol, chlorthalidone, irbesartan  5. Hyperlipidemia associated with type 2 diabetes mellitus - not fasting today - plan for lipid panel next visit - total 112, LDL 50> goal LDL < 70 - cont simvastatin  6. Anxiety - no recent panic attacks - cont lorazepam - controlled substance contract signed  7. Coronary artery disease of native artery of native heart with stable angina pectoris - followed by cariology - R/L heart cath with coronary angiography scheduled 04/19 - cont asa, statin and nitroglycerin prn  Total time: 32 minutes. Greater than 50% of total time spent doing patient education regarding health maintenance, T2DM, HTN, CAD, anxiety, and RA including symptom/medication management.    Next appt: none Tyquasia Pant Scherry Ran  [facility] [phone]

## 2022-10-22 ENCOUNTER — Ambulatory Visit (HOSPITAL_COMMUNITY)
Admission: RE | Admit: 2022-10-22 | Discharge: 2022-10-22 | Disposition: A | Discharge status: Home / Self Care | Payer: Medicare Other | Source: Ambulatory Visit | Attending: Interventional Cardiology | Admitting: Interventional Cardiology

## 2022-10-22 ENCOUNTER — Encounter (HOSPITAL_COMMUNITY)
Admission: RE | Disposition: A | Discharge status: Home / Self Care | Payer: Self-pay | Source: Ambulatory Visit | Attending: Interventional Cardiology

## 2022-10-22 ENCOUNTER — Other Ambulatory Visit: Payer: Self-pay

## 2022-10-22 DIAGNOSIS — Z7982 Long term (current) use of aspirin: Secondary | ICD-10-CM | POA: Diagnosis not present

## 2022-10-22 DIAGNOSIS — I2510 Atherosclerotic heart disease of native coronary artery without angina pectoris: Secondary | ICD-10-CM | POA: Diagnosis not present

## 2022-10-22 DIAGNOSIS — I70208 Unspecified atherosclerosis of native arteries of extremities, other extremity: Secondary | ICD-10-CM | POA: Insufficient documentation

## 2022-10-22 DIAGNOSIS — F17200 Nicotine dependence, unspecified, uncomplicated: Secondary | ICD-10-CM | POA: Diagnosis not present

## 2022-10-22 DIAGNOSIS — Z79899 Other long term (current) drug therapy: Secondary | ICD-10-CM | POA: Diagnosis not present

## 2022-10-22 DIAGNOSIS — E785 Hyperlipidemia, unspecified: Secondary | ICD-10-CM | POA: Insufficient documentation

## 2022-10-22 DIAGNOSIS — I10 Essential (primary) hypertension: Secondary | ICD-10-CM | POA: Diagnosis not present

## 2022-10-22 DIAGNOSIS — E119 Type 2 diabetes mellitus without complications: Secondary | ICD-10-CM | POA: Diagnosis not present

## 2022-10-22 DIAGNOSIS — I2584 Coronary atherosclerosis due to calcified coronary lesion: Secondary | ICD-10-CM | POA: Diagnosis not present

## 2022-10-22 DIAGNOSIS — G4733 Obstructive sleep apnea (adult) (pediatric): Secondary | ICD-10-CM | POA: Insufficient documentation

## 2022-10-22 DIAGNOSIS — R079 Chest pain, unspecified: Secondary | ICD-10-CM | POA: Insufficient documentation

## 2022-10-22 HISTORY — PX: AORTIC ARCH ANGIOGRAPHY: CATH118224

## 2022-10-22 HISTORY — PX: RIGHT/LEFT HEART CATH AND CORONARY ANGIOGRAPHY: CATH118266

## 2022-10-22 LAB — POCT I-STAT EG7
Acid-Base Excess: 0 mmol/L (ref 0.0–2.0)
Acid-Base Excess: 0 mmol/L (ref 0.0–2.0)
Bicarbonate: 26.2 mmol/L (ref 20.0–28.0)
Bicarbonate: 26.2 mmol/L (ref 20.0–28.0)
Calcium, Ion: 1.19 mmol/L (ref 1.15–1.40)
Calcium, Ion: 1.2 mmol/L (ref 1.15–1.40)
HCT: 34 % — ABNORMAL LOW (ref 36.0–46.0)
HCT: 34 % — ABNORMAL LOW (ref 36.0–46.0)
Hemoglobin: 11.6 g/dL — ABNORMAL LOW (ref 12.0–15.0)
Hemoglobin: 11.6 g/dL — ABNORMAL LOW (ref 12.0–15.0)
O2 Saturation: 77 %
O2 Saturation: 80 %
Potassium: 3.9 mmol/L (ref 3.5–5.1)
Potassium: 3.9 mmol/L (ref 3.5–5.1)
Sodium: 140 mmol/L (ref 135–145)
Sodium: 140 mmol/L (ref 135–145)
TCO2: 28 mmol/L (ref 22–32)
TCO2: 28 mmol/L (ref 22–32)
pCO2, Ven: 47.8 mmHg (ref 44–60)
pCO2, Ven: 47.9 mmHg (ref 44–60)
pH, Ven: 7.345 (ref 7.25–7.43)
pH, Ven: 7.346 (ref 7.25–7.43)
pO2, Ven: 45 mmHg (ref 32–45)
pO2, Ven: 47 mmHg — ABNORMAL HIGH (ref 32–45)

## 2022-10-22 LAB — GLUCOSE, CAPILLARY: Glucose-Capillary: 109 mg/dL — ABNORMAL HIGH (ref 70–99)

## 2022-10-22 LAB — POCT I-STAT 7, (LYTES, BLD GAS, ICA,H+H)
Acid-base deficit: 1 mmol/L (ref 0.0–2.0)
Bicarbonate: 24.7 mmol/L (ref 20.0–28.0)
Calcium, Ion: 1.17 mmol/L (ref 1.15–1.40)
HCT: 33 % — ABNORMAL LOW (ref 36.0–46.0)
Hemoglobin: 11.2 g/dL — ABNORMAL LOW (ref 12.0–15.0)
O2 Saturation: 100 %
Potassium: 4 mmol/L (ref 3.5–5.1)
Sodium: 140 mmol/L (ref 135–145)
TCO2: 26 mmol/L (ref 22–32)
pCO2 arterial: 42.6 mmHg (ref 32–48)
pH, Arterial: 7.371 (ref 7.35–7.45)
pO2, Arterial: 177 mmHg — ABNORMAL HIGH (ref 83–108)

## 2022-10-22 SURGERY — RIGHT/LEFT HEART CATH AND CORONARY ANGIOGRAPHY
Anesthesia: LOCAL

## 2022-10-22 MED ORDER — IOHEXOL 350 MG/ML SOLN
INTRAVENOUS | Status: DC | PRN
  Administered 2022-10-22: 65 mL

## 2022-10-22 MED ORDER — SODIUM CHLORIDE 0.9 % IV SOLN: 250.0000 mL | INTRAVENOUS | Status: DC | PRN

## 2022-10-22 MED ORDER — MIDAZOLAM HCL 2 MG/2ML IJ SOLN
INTRAMUSCULAR | Status: AC
  Filled 2022-10-22: qty 2

## 2022-10-22 MED ORDER — VERAPAMIL HCL 2.5 MG/ML IV SOLN
INTRAVENOUS | Status: AC
  Filled 2022-10-22: qty 2

## 2022-10-22 MED ORDER — HEPARIN SODIUM (PORCINE) 1000 UNIT/ML IJ SOLN
INTRAMUSCULAR | Status: AC
  Filled 2022-10-22: qty 10

## 2022-10-22 MED ORDER — ASPIRIN 81 MG PO CHEW: 81.0000 mg | CHEWABLE_TABLET | ORAL | Status: DC

## 2022-10-22 MED ORDER — HEPARIN (PORCINE) IN NACL 1000-0.9 UT/500ML-% IV SOLN
INTRAVENOUS | Status: DC | PRN
  Administered 2022-10-22 (×2): 500 mL

## 2022-10-22 MED ORDER — LIDOCAINE HCL (PF) 1 % IJ SOLN
INTRAMUSCULAR | Status: DC | PRN
  Administered 2022-10-22 (×2): 5 mL via INTRADERMAL

## 2022-10-22 MED ORDER — ACETAMINOPHEN 325 MG PO TABS: 650.0000 mg | ORAL_TABLET | ORAL | Status: DC | PRN

## 2022-10-22 MED ORDER — MIDAZOLAM HCL 2 MG/2ML IJ SOLN
INTRAMUSCULAR | Status: DC | PRN
  Administered 2022-10-22: 1 mg via INTRAVENOUS

## 2022-10-22 MED ORDER — SODIUM CHLORIDE 0.9 % IV SOLN: INTRAVENOUS | Status: AC

## 2022-10-22 MED ORDER — SODIUM CHLORIDE 0.9 % WEIGHT BASED INFUSION: 1.0000 mL/kg/h | INTRAVENOUS | Status: DC

## 2022-10-22 MED ORDER — SODIUM CHLORIDE 0.9 % WEIGHT BASED INFUSION
3.0000 mL/kg/h | INTRAVENOUS | Status: AC
  Administered 2022-10-22: 3 mL/kg/h via INTRAVENOUS

## 2022-10-22 MED ORDER — ONDANSETRON HCL 4 MG/2ML IJ SOLN: 4.0000 mg | Freq: Four times a day (QID) | INTRAMUSCULAR | Status: DC | PRN

## 2022-10-22 MED ORDER — SODIUM CHLORIDE 0.9% FLUSH: 3.0000 mL | Freq: Two times a day (BID) | INTRAVENOUS | Status: DC

## 2022-10-22 MED ORDER — LABETALOL HCL 5 MG/ML IV SOLN: 10.0000 mg | INTRAVENOUS | Status: DC | PRN

## 2022-10-22 MED ORDER — HEPARIN SODIUM (PORCINE) 1000 UNIT/ML IJ SOLN
INTRAMUSCULAR | Status: DC | PRN
  Administered 2022-10-22: 3500 [IU] via INTRAVENOUS

## 2022-10-22 MED ORDER — SODIUM CHLORIDE 0.9% FLUSH: 3.0000 mL | INTRAVENOUS | Status: DC | PRN

## 2022-10-22 MED ORDER — HYDRALAZINE HCL 20 MG/ML IJ SOLN: 10.0000 mg | INTRAMUSCULAR | Status: DC | PRN

## 2022-10-22 MED ORDER — FENTANYL CITRATE (PF) 100 MCG/2ML IJ SOLN
INTRAMUSCULAR | Status: DC | PRN
  Administered 2022-10-22: 25 ug via INTRAVENOUS

## 2022-10-22 MED ORDER — VERAPAMIL HCL 2.5 MG/ML IV SOLN
INTRAVENOUS | Status: DC | PRN
  Administered 2022-10-22 (×2): 10 mL via INTRA_ARTERIAL

## 2022-10-22 MED ORDER — FENTANYL CITRATE (PF) 100 MCG/2ML IJ SOLN
INTRAMUSCULAR | Status: AC
  Filled 2022-10-22: qty 2

## 2022-10-22 MED ORDER — LIDOCAINE HCL (PF) 1 % IJ SOLN
INTRAMUSCULAR | Status: AC
  Filled 2022-10-22: qty 30

## 2022-10-22 SURGICAL SUPPLY — 13 items
CATH 5FR JL3.5 JR4 ANG PIG MP (CATHETERS) IMPLANT
CATH BALLN WEDGE 5F 110CM (CATHETERS) IMPLANT
GLIDESHEATH SLEND SS 6F .021 (SHEATH) IMPLANT
GUIDEWIRE .025 260CM (WIRE) IMPLANT
GUIDEWIRE INQWIRE 1.5J.035X260 (WIRE) IMPLANT
INQWIRE 1.5J .035X260CM (WIRE) ×1
KIT HEART LEFT (KITS) ×1 IMPLANT
PACK CARDIAC CATHETERIZATION (CUSTOM PROCEDURE TRAY) ×1 IMPLANT
SHEATH GLIDE SLENDER 4/5FR (SHEATH) IMPLANT
SYR MEDRAD MARK 7 150ML (SYRINGE) ×1 IMPLANT
TRANSDUCER W/STOPCOCK (MISCELLANEOUS) ×1 IMPLANT
TUBING CIL FLEX 10 FLL-RA (TUBING) ×1 IMPLANT
TUBING CONTRAST HIGH PRESS 48 (TUBING) IMPLANT

## 2022-10-22 NOTE — Interval H&P Note (Signed)
Cath Lab Visit (complete for each Cath Lab visit)  Clinical Evaluation Leading to the Procedure:   ACS: No.  Non-ACS:    Anginal Classification: CCS III  Anti-ischemic medical therapy: Minimal Therapy (1 class of medications)  Non-Invasive Test Results: Low-risk stress test findings: cardiac mortality <1%/year  Prior CABG: No previous CABG      History and Physical Interval Note:  10/22/2022 10:44 AM  Annette Suarez  has presented today for surgery, with the diagnosis of Chest Pain.  The various methods of treatment have been discussed with the patient and family. After consideration of risks, benefits and other options for treatment, the patient has consented to  Procedure(s): RIGHT/LEFT HEART CATH AND CORONARY ANGIOGRAPHY (N/A) as a surgical intervention.  The patient's history has been reviewed, patient examined, no change in status, stable for surgery.  I have reviewed the patient's chart and labs.  Questions were answered to the patient's satisfaction.    PAD noted  Lance Muss

## 2022-10-23 ENCOUNTER — Encounter (HOSPITAL_COMMUNITY): Payer: Self-pay | Admitting: Interventional Cardiology

## 2022-10-24 ENCOUNTER — Other Ambulatory Visit: Payer: Self-pay | Admitting: Internal Medicine

## 2022-10-31 ENCOUNTER — Other Ambulatory Visit: Payer: Self-pay | Admitting: Orthopedic Surgery

## 2022-10-31 DIAGNOSIS — E118 Type 2 diabetes mellitus with unspecified complications: Secondary | ICD-10-CM

## 2022-11-02 ENCOUNTER — Ambulatory Visit (INDEPENDENT_AMBULATORY_CARE_PROVIDER_SITE_OTHER): Payer: Medicare Other | Admitting: Adult Health

## 2022-11-02 ENCOUNTER — Encounter: Payer: Self-pay | Admitting: Adult Health

## 2022-11-02 VITALS — BP 135/78 | HR 88 | Temp 97.9°F | Resp 18 | Ht 61.0 in | Wt 158.5 lb

## 2022-11-02 DIAGNOSIS — J029 Acute pharyngitis, unspecified: Secondary | ICD-10-CM | POA: Diagnosis not present

## 2022-11-02 DIAGNOSIS — R059 Cough, unspecified: Secondary | ICD-10-CM | POA: Diagnosis not present

## 2022-11-02 MED ORDER — AZITHROMYCIN 250 MG PO TABS
ORAL_TABLET | ORAL | 0 refills | Status: AC
Start: 2022-11-02 — End: 2022-11-07

## 2022-11-02 MED ORDER — DM-GUAIFENESIN ER 30-600 MG PO TB12
1.0000 | ORAL_TABLET | Freq: Two times a day (BID) | ORAL | 0 refills | Status: AC
Start: 2022-11-02 — End: 2022-11-16

## 2022-11-02 NOTE — Progress Notes (Signed)
[facility] clinic  Provider:  Kenard Gower DNP  Code Status:  Full Code  Goals of Care:     10/22/2022    8:59 AM  Advanced Directives  Does Patient Have a Medical Advance Directive? Yes  Type of Estate agent of [place];Living will  Does patient want to make changes to medical advance directive? No - Guardian declined     Chief Complaint  Patient presents with   Acute Visit    Cough    HPI: Patient is a 82 y.o. female seen today for an acute visit for cough. She had been coughing, dry, for 1.5 weeks now. She denies chills nor fever. She has sore throat, feels tired and doesn't want to do anything. She has a raspy voice.  Past Medical History:  Diagnosis Date   Acute cystitis    UTI   Anxiety    Arthritis    Benign positional vertigo    Dermatitis    Diabetes mellitus without complication (HCC)    Family history of adverse reaction to anesthesia    GERD (gastroesophageal reflux disease)    Goiter, nontoxic, multinodular    Gout    H/O: hysterectomy    History of tobacco use    Hx laparoscopic cholecystectomy    Hypercholesterolemia    Hypertension    Incomplete bladder emptying    Insomnia    Nausea & vomiting 09/08/2017   Postmenopausal atrophic vaginitis    RA (rheumatoid arthritis) (HCC)    Rectocele    Renal calculus    Skin cancer    Sleep apnea    Thyroid condition    Type 2 diabetes mellitus (HCC)    Urolithiasis    Vaginal enterocele     Past Surgical History:  Procedure Laterality Date   AORTIC ARCH ANGIOGRAPHY N/A 10/22/2022   Procedure: AORTIC ARCH ANGIOGRAPHY;  Surgeon: Corky Crafts, MD;  Location: [facility];  Service: Cardiovascular;  Laterality: N/A;   CARPAL TUNNEL RELEASE  2018   CATARACT EXTRACTION, BILATERAL     CHOLECYSTECTOMY     COLONOSCOPY     07/11/2009, 02/2015   GALLBLADDER SURGERY     KNEE ARTHROSCOPY Right 05/20/2020   Procedure: RIGHT KNEE ARTHROSCOPY AND DEBRIDEMENT;  Surgeon:  Nadara Mustard, MD;  Location: [facility];  Service: Orthopedics;  Laterality: Right;   PARTIAL HYSTERECTOMY     [place] left intact   RIGHT/LEFT HEART CATH AND CORONARY ANGIOGRAPHY N/A 10/22/2022   Procedure: RIGHT/LEFT HEART CATH AND CORONARY ANGIOGRAPHY;  Surgeon: Corky Crafts, MD;  Location: [facility] INVASIVE CV LAB;  Service: Cardiovascular;  Laterality: N/A;   SQUAMOUS CELL CARCINOMA EXCISION  05/2018    Allergies  Allergen Reactions   Latex Rash   Amaryl [Glimepiride]     Don't Remember   Amlodipine     LE edema on 7.5mg  daily. Tolerates 5mg  daily just fine   Codeine     SICK   Jardiance [Empagliflozin]     Urinary tract infection- SIDE EFFECT   Magnesium Citrate Other (See Comments)    Strong urine odor and dysuria   Pneumovax 23 [Pneumococcal Vac Polyvalent]     Red / Swelling / Hot to touch SIDE EFFECTS   Wellbutrin [Bupropion]     Unknown    Outpatient Encounter Medications as of 11/02/2022  Medication Sig   Accu-Chek Softclix Lancets lancets Use to test blood sugar three times daily. Dx: E11.8   aspirin EC 81 MG tablet Take  81 mg by mouth daily.   baclofen (LIORESAL) 10 MG tablet Take 1 tablet (10 mg total) by mouth at bedtime as needed for muscle spasms.   Biotin 1 MG CAPS Take 1 mg by mouth.   carvedilol (COREG) 6.25 MG tablet TAKE ONE TABLET BY MOUTH TWICE A DAY   chlorthalidone (HYGROTON) 25 MG tablet Take 1 tablet (25 mg total) by mouth daily.   Cholecalciferol (VITAMIN D3) 50 MCG (2000 UT) capsule Take 2,000 Units by mouth daily.   clobetasol cream (TEMOVATE) 0.05 % Apply 1 Application topically 2 (two) times daily.   Continuous Blood Gluc Receiver (FREESTYLE LIBRE 2 READER) DEVI    Continuous Glucose Sensor (FREESTYLE LIBRE 2 SENSOR) MISC USE TO CHECK BLOOD SUGAR - CHECK EVERY 14 DAYS   CRANBERRY EXTRACT PO Take 650 mg by mouth daily.   Diclofenac Sodium (VOLTAREN EX) Apply 1 application  topically 3 (three) times daily as needed (Arthritis).    estradiol (ESTRACE) 0.1 MG/GM vaginal cream Place 1 Applicatorful vaginally 3 (three) times a week.   etanercept (ENBREL SURECLICK) 50 MG/ML injection Inject 50 mg into the skin once a week.   fluocinonide (LIDEX) 0.05 % external solution Apply 1 Application topically daily as needed.   insulin degludec (TRESIBA FLEXTOUCH) 200 UNIT/ML FlexTouch Pen Inject 60 Units into the skin daily in the afternoon.   Insulin Pen Needle 31G X 5 MM MISC 1 Device by Does not apply route daily in the afternoon.   irbesartan (AVAPRO) 300 MG tablet Take 1 tablet (300 mg total) by mouth daily. Please call [phone] to schedule an appointment for July for future refills thank you.   loperamide (IMODIUM A-D) 2 MG tablet Take 2 mg by mouth daily as needed for diarrhea or loose stools.   LORazepam (ATIVAN) 0.5 MG tablet TAKE ONE TABLET BY MOUTH EVERY NIGHT AT BEDTIME   MAGNESIUM CITRATE PO Take 250 mg by mouth daily as needed (leg cramps).   MELATONIN PO Take 10 mg by mouth at bedtime as needed (Sleep).   Multiple Vitamins-Minerals (HAIR SKIN & NAILS PO) Take 1 tablet by mouth 2 (two) times daily.   nitroGLYCERIN (NITROSTAT) 0.4 MG SL tablet Place 1 tablet (0.4 mg total) under the tongue every 5 (five) minutes as needed for chest pain.   Omeprazole 20 MG TBDD Take 20 mg by mouth daily.   ondansetron (ZOFRAN) 4 MG tablet Take 1 tablet (4 mg total) by mouth 3 (three) times daily as needed for nausea or vomiting.   Probiotic Product (PROBIOTIC PO) Take 1 tablet by mouth daily.   Propylene Glycol (SYSTANE COMPLETE OP) Place 1-2 drops into both eyes daily as needed (Dry eye).   Semaglutide,0.25 or 0.5MG /DOS, (OZEMPIC, 0.25 OR 0.5 MG/DOSE,) 2 MG/1.5ML SOPN Inject 0.5 mg into the skin once a week.   simvastatin (ZOCOR) 20 MG tablet TAKE ONE TABLET BY MOUTH EVERY EVENING   No facility-administered encounter medications on file as of 11/02/2022.    Review of Systems:  Review of Systems  Constitutional:  Positive for  fatigue. Negative for appetite change, chills and fever.  HENT:  Positive for sore throat. Negative for congestion, hearing loss and rhinorrhea.   Eyes: Negative.   Respiratory:  Positive for cough. Negative for shortness of breath and wheezing.   Cardiovascular:  Negative for chest pain, palpitations and leg swelling.  Gastrointestinal:  Negative for abdominal pain, constipation, diarrhea, nausea and vomiting.  Genitourinary:  Negative for dysuria.  Musculoskeletal:  Negative for arthralgias, back pain  and myalgias.  Skin:  Negative for color change, rash and wound.  Neurological:  Negative for dizziness, weakness and headaches.  Psychiatric/Behavioral:  Negative for behavioral problems. The patient is not nervous/anxious.     Health Maintenance  Topic Date Due   INFLUENZA VACCINE  02/03/2023   HEMOGLOBIN A1C  03/13/2023   Diabetic kidney evaluation - Urine ACR  04/03/2023   DTaP/Tdap/Td (2 - Td or Tdap) 05/02/2023   Medicare Annual Wellness (AWV)  05/12/2023   OPHTHALMOLOGY EXAM  07/09/2023   FOOT EXAM  09/10/2023   Diabetic kidney evaluation - eGFR measurement  10/14/2023   Pneumonia Vaccine 71+ Years old  Completed   DEXA SCAN  Completed   Hepatitis C Screening  Completed   HPV VACCINES  Aged Out   COVID-19 Vaccine  Discontinued   Zoster Vaccines- Shingrix  Discontinued    Physical Exam: Vitals:   11/02/22 1123  BP: 135/78  Pulse: 88  Resp: 18  Temp: 97.9 F (36.6 C)  SpO2: 96%  Weight: 158 lb 8 oz (71.9 kg)  Height: 5\' 1"  (1.549 m)   Body mass index is 29.95 kg/m. Physical Exam Constitutional:      General: She is not in acute distress. HENT:     Head: Normocephalic and atraumatic.     Nose: Nose normal.     Mouth/Throat:     Mouth: Mucous membranes are moist.     Pharynx: Posterior oropharyngeal erythema present. No oropharyngeal exudate.  Eyes:     Conjunctiva/sclera: Conjunctivae normal.  Cardiovascular:     Rate and Rhythm: Normal rate and regular  rhythm.  Pulmonary:     Effort: Pulmonary effort is normal.     Breath sounds: Normal breath sounds.  Abdominal:     General: Bowel sounds are normal.     Palpations: Abdomen is soft.  Musculoskeletal:        General: Normal range of motion.     Cervical back: Normal range of motion.  Skin:    General: Skin is warm and dry.  Neurological:     General: No focal deficit present.     Mental Status: She is alert and oriented to person, place, and time.  Psychiatric:        Mood and Affect: Mood normal.        Behavior: Behavior normal.        Thought Content: Thought content normal.        Judgment: Judgment normal.     Labs reviewed: Basic Metabolic Panel: Recent Labs    04/01/22 0931 06/24/22 1133 10/14/22 1238 10/22/22 1111 10/22/22 1120 10/22/22 1121  NA 140 138 140 140 140 140  K 4.5 4.8 4.3 4.0 3.9 3.9  CL 103 101 99  --   --   --   CO2 29 28 27   --   --   --   GLUCOSE 71 198* 156*  --   --   --   BUN 25 21 30*  --   --   --   CREATININE 1.06* 1.26* 1.17*  --   --   --   CALCIUM 9.4 9.3 9.5  --   --   --    Liver Function Tests: Recent Labs    12/30/21 1109 03/29/22 1455 06/24/22 1133  AST 12 11 10   ALT 11 10 9   BILITOT 1.4* 1.2 1.0  PROT 6.7 6.7 6.5   No results for input(s): "LIPASE", "AMYLASE" in the last 8760 hours. No  results for input(s): "AMMONIA" in the last 8760 hours. CBC: Recent Labs    03/29/22 1455 04/01/22 0931 06/24/22 1133 10/14/22 1238 10/22/22 1111 10/22/22 1120 10/22/22 1121  WBC 11.4* 9.1 10.3 11.5*  --   --   --   NEUTROABS 1,610 5,997 7,086  --   --   --   --   HGB 12.9 13.0 12.8 13.3 11.2* 11.6* 11.6*  HCT 37.1 37.8 37.3 39.2 33.0* 34.0* 34.0*  MCV 92.8 91.5 93.3 94  --   --   --   PLT 302 289 308 330  --   --   --    Lipid Panel: No results for input(s): "CHOL", "HDL", "LDLCALC", "TRIG", "CHOLHDL", "LDLDIRECT" in the last 8760 hours. Lab Results  Component Value Date   HGBA1C 7.8 (A) 09/10/2022    Procedures  since last visit: CARDIAC CATHETERIZATION  Result Date: 10/22/2022   Mid Cx lesion is 50% stenosed.  Moderate calcification.   Dist Cx lesion is 50% stenosed.   The left ventricular systolic function is normal.   LV end diastolic pressure is mildly elevated.  LVEDP 21 mmHg.   The left ventricular ejection fraction is greater than 65% by visual estimate.   There is no aortic valve stenosis.   On 2L [place] Aortic saturation 100%, PA saturation 79%, RA pressure 17/13, mean RA pressure 12 mmHg, PA pressure 39/19, mean PA pressure 25 mmHg, pulmonary capillary wedge pressure 22/22, mean pulmonary capillary wedge pressure 22 mmHg, cardiac output 6 L/min, cardiac index 3.5.   Heavily calcified proximal left subclavian with delayed filling. Nonobstructive coronary artery disease.  Upper normal pulmonary artery pressures. Heavily calcified proximal left subclavian.    PERIPHERAL VASCULAR CATHETERIZATION  Result Date: 10/22/2022   Mid Cx lesion is 50% stenosed.  Moderate calcification.   Dist Cx lesion is 50% stenosed.   The left ventricular systolic function is normal.   LV end diastolic pressure is mildly elevated.  LVEDP 21 mmHg.   The left ventricular ejection fraction is greater than 65% by visual estimate.   There is no aortic valve stenosis.   On 2L [place] Aortic saturation 100%, PA saturation 79%, RA pressure 17/13, mean RA pressure 12 mmHg, PA pressure 39/19, mean PA pressure 25 mmHg, pulmonary capillary wedge pressure 22/22, mean pulmonary capillary wedge pressure 22 mmHg, cardiac output 6 L/min, cardiac index 3.5.   Heavily calcified proximal left subclavian with delayed filling. Nonobstructive coronary artery disease.  Upper normal pulmonary artery pressures. Heavily calcified proximal left subclavian.     Assessment/Plan  1. Acute pharyngitis, unspecified etiology - azithromycin (ZITHROMAX) 250 MG tablet; Take 2 tablets on day 1, then 1 tablet daily on days 2 through 5  Dispense: 6 tablet; Refill: 0 -   instructed to gargle with warm water and salt  2. Cough in adult - dextromethorphan-guaiFENesin (MUCINEX DM) 30-600 MG 12hr tablet; Take 1 tablet by mouth 2 (two) times daily for 14 days.  Dispense: 28 tablet; Refill: 0    Labs/tests ordered:  None  Next appt:  04/14/2023

## 2022-11-03 ENCOUNTER — Ambulatory Visit: Payer: Medicare Other | Admitting: Rheumatology

## 2022-11-03 DIAGNOSIS — Z8639 Personal history of other endocrine, nutritional and metabolic disease: Secondary | ICD-10-CM

## 2022-11-03 DIAGNOSIS — M19072 Primary osteoarthritis, left ankle and foot: Secondary | ICD-10-CM

## 2022-11-03 DIAGNOSIS — Z87898 Personal history of other specified conditions: Secondary | ICD-10-CM

## 2022-11-03 DIAGNOSIS — M8589 Other specified disorders of bone density and structure, multiple sites: Secondary | ICD-10-CM

## 2022-11-03 DIAGNOSIS — I10 Essential (primary) hypertension: Secondary | ICD-10-CM

## 2022-11-03 DIAGNOSIS — M5136 Other intervertebral disc degeneration, lumbar region: Secondary | ICD-10-CM

## 2022-11-03 DIAGNOSIS — M19041 Primary osteoarthritis, right hand: Secondary | ICD-10-CM

## 2022-11-03 DIAGNOSIS — Z87891 Personal history of nicotine dependence: Secondary | ICD-10-CM

## 2022-11-03 DIAGNOSIS — Z8669 Personal history of other diseases of the nervous system and sense organs: Secondary | ICD-10-CM

## 2022-11-03 DIAGNOSIS — M7061 Trochanteric bursitis, right hip: Secondary | ICD-10-CM

## 2022-11-03 DIAGNOSIS — K219 Gastro-esophageal reflux disease without esophagitis: Secondary | ICD-10-CM

## 2022-11-03 DIAGNOSIS — Z8659 Personal history of other mental and behavioral disorders: Secondary | ICD-10-CM

## 2022-11-03 DIAGNOSIS — M0609 Rheumatoid arthritis without rheumatoid factor, multiple sites: Secondary | ICD-10-CM

## 2022-11-03 DIAGNOSIS — Z79899 Other long term (current) drug therapy: Secondary | ICD-10-CM

## 2022-11-03 DIAGNOSIS — Z794 Long term (current) use of insulin: Secondary | ICD-10-CM

## 2022-11-03 DIAGNOSIS — R252 Cramp and spasm: Secondary | ICD-10-CM

## 2022-11-03 DIAGNOSIS — M542 Cervicalgia: Secondary | ICD-10-CM

## 2022-11-03 DIAGNOSIS — M1711 Unilateral primary osteoarthritis, right knee: Secondary | ICD-10-CM

## 2022-11-03 NOTE — Progress Notes (Signed)
Office Visit Note  Patient: Annette Suarez             Date of Birth: [birth year]           MRN: [id number]             PCP: Octavia Heir, NP Referring: Octavia Heir, NP Visit Date: 11/15/2022 Occupation: @GUAROCC @  Subjective:  Muscle cramping   History of Present Illness: Annette Suarez is a 82 y.o. female with history of seronegative rheumatoid arthritis and osteoarthritis. She remains on enbrel 50 mg sq injections every week.  She is tolerating Enbrel without any side effects or injection site reactions.  According to the patient she had a recent upper respiratory tract infection at which time she was treated with a Z-Pak.  She held Enbrel for 1 week until her symptoms have resolved.  Patient states she continues to have chronic pain in the right knee joint.  She is not a good candidate for cortisone injections due to it raising her blood sugar.  She has tried Visco gel injections in the past with no improvement in her symptoms.  She has declined physical therapy and does not want to proceed with knee replacement.  She plans on following up with her orthopedist if her symptoms persist or worsen.  Patient states that her biggest concern has been ongoing muscle cramping and muscle spasms.  She takes magnesium citrate 3 times daily which has been helpful but she continues to have cramping at least every other day.  She recently took baclofen which helped her sleep through the night without having nocturnal muscle cramps.  She states that baclofen caused significant drowsiness so she would like to discuss other treatment options for muscle relaxers.    Activities of Daily Living:  Patient reports morning stiffness for 1-1.5 hours.   Patient Denies nocturnal pain.  Difficulty dressing/grooming: Denies Difficulty climbing stairs: Denies Difficulty getting out of chair: Denies Difficulty using hands for taps, buttons, cutlery, and/or writing: Denies  Review of Systems  Constitutional:  Positive  for fatigue.  HENT:  Positive for mouth dryness. Negative for mouth sores.   Eyes:  Negative for dryness.  Respiratory:  Negative for shortness of breath.   Cardiovascular:  Negative for chest pain and palpitations.  Gastrointestinal:  Negative for blood in stool, constipation and diarrhea.  Endocrine: Negative for increased urination.  Genitourinary:  Negative for involuntary urination.  Musculoskeletal:  Positive for myalgias, morning stiffness, muscle tenderness and myalgias. Negative for joint pain, gait problem, joint pain, joint swelling and muscle weakness.  Skin:  Negative for color change, rash, hair loss and sensitivity to sunlight.  Allergic/Immunologic: Negative for susceptible to infections.  Neurological:  Negative for dizziness and headaches.  Hematological:  Negative for swollen glands.  Psychiatric/Behavioral:  Negative for depressed mood and sleep disturbance. The patient is nervous/anxious.     PMFS History:  Patient Active Problem List   Diagnosis Date Noted   Coronary artery disease of native artery of native heart with stable angina pectoris (HCC) 01/27/2022   Hyperlipidemia associated with type 2 diabetes mellitus (HCC) 01/27/2022   Diabetes mellitus with coincident hypertension (HCC) 08/13/2021   Type 2 diabetes mellitus with stage 3a chronic kidney disease, with long-term current use of insulin (HCC) 08/13/2021   Multinodular goiter 08/13/2021   Subclavian arterial stenosis (HCC) 09/11/2020   Aortic atherosclerosis (HCC) 09/11/2020   Alteration in blood pressure 06/11/2020   Old complex tear of medial meniscus of right knee  Old complex tear of lateral meniscus of right knee    Primary osteoarthritis of both feet 12/13/2017   Dehydration 09/09/2017   Acute lower UTI 09/09/2017   Nausea & vomiting 09/08/2017   Primary osteoarthritis of both hands 04/05/2017   Left carpal tunnel syndrome 11/15/2016   Rheumatoid arthritis of multiple sites with negative  rheumatoid factor (HCC) 10/06/2016   High risk medication use 10/06/2016   Dyslipidemia 10/06/2016   Pain in both hands 09/08/2016   Hypertension associated with diabetes (HCC) 09/08/2016   Type 2 diabetes mellitus  09/08/2016   Gastroesophageal reflux disease without esophagitis 09/08/2016   Osteopenia of multiple sites 09/08/2016   History of anxiety 09/08/2016   Benign paroxysmal positional vertigo 09/08/2016   Sleep apnea 09/08/2016    Past Medical History:  Diagnosis Date   Acute cystitis    UTI   Anxiety    Arthritis    Benign positional vertigo    Dermatitis    Diabetes mellitus without complication (HCC)    Family history of adverse reaction to anesthesia    GERD (gastroesophageal reflux disease)    Goiter, nontoxic, multinodular    Gout    H/O: hysterectomy    History of tobacco use    Hx laparoscopic cholecystectomy    Hypercholesterolemia    Hypertension    Incomplete bladder emptying    Insomnia    Nausea & vomiting 09/08/2017   Postmenopausal atrophic vaginitis    RA (rheumatoid arthritis) (HCC)    Rectocele    Renal calculus    Skin cancer    Sleep apnea    Thyroid condition    Type 2 diabetes mellitus (HCC)    Urolithiasis    Vaginal enterocele     Family History  Problem Relation Age of Onset   Diabetes Mother    Heart disease Father 31   Diabetes Son    Crohn's disease Son    Past Surgical History:  Procedure Laterality Date   AORTIC ARCH ANGIOGRAPHY N/A 10/22/2022   Procedure: AORTIC ARCH ANGIOGRAPHY;  Surgeon: Corky Crafts, MD;  Location: [facility];  Service: Cardiovascular;  Laterality: N/A;   CARPAL TUNNEL RELEASE  2018   CATARACT EXTRACTION, BILATERAL     CHOLECYSTECTOMY     COLONOSCOPY     07/11/2009, 02/2015   GALLBLADDER SURGERY     KNEE ARTHROSCOPY Right 05/20/2020   Procedure: RIGHT KNEE ARTHROSCOPY AND DEBRIDEMENT;  Surgeon: Nadara Mustard, MD;  Location: [facility];  Service: Orthopedics;   Laterality: Right;   PARTIAL HYSTERECTOMY     [place] left intact   RIGHT/LEFT HEART CATH AND CORONARY ANGIOGRAPHY N/A 10/22/2022   Procedure: RIGHT/LEFT HEART CATH AND CORONARY ANGIOGRAPHY;  Surgeon: Corky Crafts, MD;  Location: [facility];  Service: Cardiovascular;  Laterality: N/A;   SQUAMOUS CELL CARCINOMA EXCISION  05/2018   Social History   Social History Narrative   Diet: Blank      Do you drink/ eat things with caffeine? Yes      Marital status:  Married                             What year were you married ? 196      Do you live in a house, apartment,assistred living, condo, trailer, etc.)? House      Is it one or more stories? 1      How many persons live  in your home ? 2      Do you have any pets in your home ?(please list)  1 Sam-Dog      Highest Level of education completed: 12      Current or past profession: Office      Do you exercise?   Some                           Type & how often  Walk Daily      ADVANCED DIRECTIVES (Please bring copies)      Do you have a living will? Blank      Do you have a DNR form?  Blank                     If not, do you want to discuss one?       Do you have signed POA?HPOA forms?  Yes               If so, please bring to your appointment      FUNCTIONAL STATUS- To be completed by Spouse / child / Staff       Do you have difficulty bathing or dressing yourself ?  No      Do you have difficulty preparing food or eating ?  No      Do you have difficulty managing your mediation ?  No      Do you have difficulty managing your finances ?  No      Do you have difficulty affording your medication ?  Yes (Some)      Immunization History  Administered Date(s) Administered   Fluad Quad(high Dose 65+) 04/09/2021   Influenza Split 03/28/2008, 03/05/2010, 04/17/2012, 04/20/2013   Influenza, High Dose Seasonal PF 02/08/2020, 05/04/2022   Influenza-Unspecified 05/11/2011, 04/22/2014, 04/15/2015, 02/20/2016,  03/02/2017, 03/09/2018, 03/20/2019, 03/21/2020   PFIZER(Purple Top)SARS-COV-2 Vaccination 07/24/2019, 08/06/2019, 08/27/2019, 04/21/2020   Pneumococcal Conjugate-13 05/02/2014   Pneumococcal Polysaccharide-23 04/23/2008   Tdap 05/01/2013   Zoster, Live 04/17/2012     Objective: Vital Signs: BP (!) 164/75 (BP Location: Right Arm, Patient Position: Sitting, Cuff Size: Normal)   Pulse 80   Resp 14   Ht 5\' 1"  (1.549 m)   Wt 157 lb 12.8 oz (71.6 kg)   BMI 29.82 kg/m    Physical Exam Vitals and nursing note reviewed.  Constitutional:      Appearance: She is well-developed.  HENT:     Head: Normocephalic and atraumatic.  Eyes:     Conjunctiva/sclera: Conjunctivae normal.  Cardiovascular:     Rate and Rhythm: Normal rate and regular rhythm.     Heart sounds: Normal heart sounds.  Pulmonary:     Effort: Pulmonary effort is normal.     Breath sounds: Normal breath sounds.  Abdominal:     General: Bowel sounds are normal.     Palpations: Abdomen is soft.  Musculoskeletal:     Cervical back: Normal range of motion.  Lymphadenopathy:     Cervical: No cervical adenopathy.  Skin:    General: Skin is warm and dry.     Capillary Refill: Capillary refill takes less than 2 seconds.  Neurological:     Mental Status: She is alert and oriented to person, place, and time.  Psychiatric:        Behavior: Behavior normal.      Musculoskeletal Exam: C-spine has good range of motion.  No midline spinal  tenderness.  Shoulder joints, elbow joints, wrist joints, MCPs, PIPs, DIPs have good range of motion with no synovitis.  PIP and DIP thickening noted.  Hip joints have good range of motion with no groin pain.  Knee joints have good range of motion with some discomfort in her right knee.  Ankle joints have good range of motion with no tenderness or synovitis.  CDAI Exam: CDAI Score: -- Patient Global: 4 mm; Provider Global: 2 mm Swollen: --; Tender: -- Joint Exam 11/15/2022   No joint exam  has been documented for this visit   There is currently no information documented on the homunculus. Go to the Rheumatology activity and complete the homunculus joint exam.  Investigation: No additional findings.  Imaging: CARDIAC CATHETERIZATION  Result Date: 10/22/2022   Mid Cx lesion is 50% stenosed.  Moderate calcification.   Dist Cx lesion is 50% stenosed.   The left ventricular systolic function is normal.   LV end diastolic pressure is mildly elevated.  LVEDP 21 mmHg.   The left ventricular ejection fraction is greater than 65% by visual estimate.   There is no aortic valve stenosis.   On 2L [place] Aortic saturation 100%, PA saturation 79%, RA pressure 17/13, mean RA pressure 12 mmHg, PA pressure 39/19, mean PA pressure 25 mmHg, pulmonary capillary wedge pressure 22/22, mean pulmonary capillary wedge pressure 22 mmHg, cardiac output 6 L/min, cardiac index 3.5.   Heavily calcified proximal left subclavian with delayed filling. Nonobstructive coronary artery disease.  Upper normal pulmonary artery pressures. Heavily calcified proximal left subclavian.    PERIPHERAL VASCULAR CATHETERIZATION  Result Date: 10/22/2022   Mid Cx lesion is 50% stenosed.  Moderate calcification.   Dist Cx lesion is 50% stenosed.   The left ventricular systolic function is normal.   LV end diastolic pressure is mildly elevated.  LVEDP 21 mmHg.   The left ventricular ejection fraction is greater than 65% by visual estimate.   There is no aortic valve stenosis.   On 2L [place] Aortic saturation 100%, PA saturation 79%, RA pressure 17/13, mean RA pressure 12 mmHg, PA pressure 39/19, mean PA pressure 25 mmHg, pulmonary capillary wedge pressure 22/22, mean pulmonary capillary wedge pressure 22 mmHg, cardiac output 6 L/min, cardiac index 3.5.   Heavily calcified proximal left subclavian with delayed filling. Nonobstructive coronary artery disease.  Upper normal pulmonary artery pressures. Heavily calcified proximal left subclavian.      Recent Labs: Lab Results  Component Value Date   WBC 11.5 (H) 10/14/2022   HGB 11.6 (L) 10/22/2022   PLT 330 10/14/2022   NA 140 10/22/2022   K 3.9 10/22/2022   CL 99 10/14/2022   CO2 27 10/14/2022   GLUCOSE 156 (H) 10/14/2022   BUN 30 (H) 10/14/2022   CREATININE 1.17 (H) 10/14/2022   BILITOT 1.0 06/24/2022   ALKPHOS 83 07/25/2019   AST 10 06/24/2022   ALT 9 06/24/2022   PROT 6.5 06/24/2022   ALBUMIN 4.6 07/25/2019   CALCIUM 9.5 10/14/2022   GFRAA 43 (L) 01/09/2021   QFTBGOLDPLUS NEGATIVE 06/24/2022    Speciality Comments: Recieves Enbrel through [facility] 02/20/2019 Osteopenia managed by Dr. Eli Phillips 02/20/2019  Procedures:  No procedures performed Allergies: Latex, Amaryl [glimepiride], Amlodipine, Codeine, Jardiance [empagliflozin], Pneumovax 23 [pneumococcal vac polyvalent], and Wellbutrin [bupropion]   Assessment / Plan:     Visit Diagnoses: Rheumatoid arthritis of multiple sites with negative rheumatoid factor (HCC): She has no synovitis on examination today.  She has not had any  signs or symptoms of a rheumatoid arthritis flare.  Her rheumatoid arthritis has been well-controlled on Enbrel 50 mg subcutaneous injections once weekly.  She is tolerating Enbrel without any side effects or injection site reactions.  No medication changes will be made at this time.  She was advised to notify us if she develops signs or symptoms of a flare.  She will follow-up in the office in 5 months or sooner if needed.  High risk medication use - Enbrel 50 mg sq injections once weekly. (inadequate response to Arava) TB gold negative on 06/24/22.  CBC and BMP drawn on 10/14/22.  Her next lab work will be due in July and every 3 months to monitor for drug toxicity.  Standing orders for CBC and CMP remain in place. Discussed the importance of holding enbrel if she develops signs or symptoms of an infection and to resume once the infection has completely cleared.    Primary osteoarthritis of both hands: She has PIP and DIP thickening consistent with osteoarthritis of both hands.  No tenderness or inflammation noted.  Complete fist formation bilaterally.  Trochanteric bursitis, right hip: Intermittently symptomatic.  Primary osteoarthritis of right knee: X-rays of the right knee from 07/29/2021 were consistent with severe osteoarthritis and moderate chondromalacia patella.  Under the care of [facility].  Patient has tried cortisone injections and Visco gel injections in the past.  She is not a good candidate for cortisone injections due to type 2 diabetes.  She had no response to Visco gel injections in the past.  Declined referral to physical therapy at this time.  She is not ready to proceed with a knee replacement at this time.  She plans on following back up with Ortho.   Primary osteoarthritis of both feet: She is not experiencing any discomfort in her feet at this time.  She is good range of motion of both ankle joints with no tenderness or inflammation.  DDD (degenerative disc disease), lumbar - X-rays from March 31, 2017 done by Dr. Tiburcio Pea were reviewed which showed multilevel spondylosis and facet joint arthropathy.  No symptoms of radiculopathy at this time.  She experiences muscle spasms intermittently.  A prescription for methocarbamol 500 mg 1 tablet daily as needed for muscle spasms was sent to the pharmacy today.  The prescription for baclofen was discontinued due to excessive drowsiness.  Nocturnal muscle cramps: She is taking magnesium citrate 3 times daily.  Osteopenia of multiple sites - DEXA order placed by PCP.  She is taking vitamin D 2000 units daily.  Other medical conditions are listed as follows:   Type 2 diabetes mellitus: Not a good candidate for cortisone injections.  Essential hypertension: Blood pressure was 164/75 today in the office.  Her blood pressure was rechecked prior to leaving.  She is advised to monitor  blood pressure closely and reach out to her PCP if her blood pressure remains elevated.  History of hyperlipidemia  History of sleep apnea  Gastroesophageal reflux disease without esophagitis  History of vertigo  History of anxiety  Former smoker  Orders: No orders of the defined types were placed in this encounter.  Meds ordered this encounter  Medications   methocarbamol (ROBAXIN) 500 MG tablet    Sig: Take 1 tablet (500 mg total) by mouth daily as needed for muscle spasms.    Dispense:  30 tablet    Refill:  0      Follow-Up Instructions: Return in about 5 months (around 04/17/2023) for Rheumatoid arthritis,  Osteoarthritis.   Ofilia Neas, PA-C  Note - This record has been created using Dragon software.  Chart creation errors have been sought, but may not always  have been located. Such creation errors do not reflect on  the standard of medical care.

## 2022-11-05 ENCOUNTER — Telehealth: Payer: Self-pay | Admitting: *Deleted

## 2022-11-05 NOTE — Telephone Encounter (Signed)
Patient contacted the office and states he is going to be starting antibiotics and is on Enbrel. Patient wanted to know if she should take her Enbrel injection. Patient advised to hold her Enbrel injection until she has completed her antibiotics and all symptoms have resolved. Patient expressed understanding.

## 2022-11-09 DIAGNOSIS — N3000 Acute cystitis without hematuria: Secondary | ICD-10-CM | POA: Diagnosis not present

## 2022-11-09 DIAGNOSIS — L900 Lichen sclerosus et atrophicus: Secondary | ICD-10-CM | POA: Diagnosis not present

## 2022-11-15 ENCOUNTER — Ambulatory Visit: Payer: Medicare Other | Attending: Rheumatology | Admitting: Physician Assistant

## 2022-11-15 ENCOUNTER — Encounter: Payer: Self-pay | Admitting: Physician Assistant

## 2022-11-15 VITALS — BP 164/75 | HR 80 | Resp 14 | Ht 61.0 in | Wt 157.8 lb

## 2022-11-15 DIAGNOSIS — Z79899 Other long term (current) drug therapy: Secondary | ICD-10-CM | POA: Diagnosis not present

## 2022-11-15 DIAGNOSIS — Z794 Long term (current) use of insulin: Secondary | ICD-10-CM

## 2022-11-15 DIAGNOSIS — M0609 Rheumatoid arthritis without rheumatoid factor, multiple sites: Secondary | ICD-10-CM

## 2022-11-15 DIAGNOSIS — I10 Essential (primary) hypertension: Secondary | ICD-10-CM | POA: Diagnosis not present

## 2022-11-15 DIAGNOSIS — M5136 Other intervertebral disc degeneration, lumbar region: Secondary | ICD-10-CM | POA: Diagnosis not present

## 2022-11-15 DIAGNOSIS — R252 Cramp and spasm: Secondary | ICD-10-CM

## 2022-11-15 DIAGNOSIS — Z8669 Personal history of other diseases of the nervous system and sense organs: Secondary | ICD-10-CM

## 2022-11-15 DIAGNOSIS — Z8639 Personal history of other endocrine, nutritional and metabolic disease: Secondary | ICD-10-CM

## 2022-11-15 DIAGNOSIS — M51369 Other intervertebral disc degeneration, lumbar region without mention of lumbar back pain or lower extremity pain: Secondary | ICD-10-CM

## 2022-11-15 DIAGNOSIS — M19041 Primary osteoarthritis, right hand: Secondary | ICD-10-CM

## 2022-11-15 DIAGNOSIS — M7061 Trochanteric bursitis, right hip: Secondary | ICD-10-CM | POA: Diagnosis not present

## 2022-11-15 DIAGNOSIS — M1711 Unilateral primary osteoarthritis, right knee: Secondary | ICD-10-CM | POA: Diagnosis not present

## 2022-11-15 DIAGNOSIS — Z87898 Personal history of other specified conditions: Secondary | ICD-10-CM

## 2022-11-15 DIAGNOSIS — M19072 Primary osteoarthritis, left ankle and foot: Secondary | ICD-10-CM

## 2022-11-15 DIAGNOSIS — Z8659 Personal history of other mental and behavioral disorders: Secondary | ICD-10-CM

## 2022-11-15 DIAGNOSIS — E118 Type 2 diabetes mellitus with unspecified complications: Secondary | ICD-10-CM | POA: Diagnosis not present

## 2022-11-15 DIAGNOSIS — M19042 Primary osteoarthritis, left hand: Secondary | ICD-10-CM

## 2022-11-15 DIAGNOSIS — M19071 Primary osteoarthritis, right ankle and foot: Secondary | ICD-10-CM

## 2022-11-15 DIAGNOSIS — M8589 Other specified disorders of bone density and structure, multiple sites: Secondary | ICD-10-CM

## 2022-11-15 DIAGNOSIS — K219 Gastro-esophageal reflux disease without esophagitis: Secondary | ICD-10-CM

## 2022-11-15 DIAGNOSIS — Z87891 Personal history of nicotine dependence: Secondary | ICD-10-CM

## 2022-11-15 MED ORDER — METHOCARBAMOL 500 MG PO TABS: 500.0000 mg | ORAL_TABLET | Freq: Every day | ORAL | 0 refills | Status: DC | PRN

## 2022-11-15 NOTE — Patient Instructions (Addendum)
Standing Labs We placed an order today for your standing lab work.   Please have your standing labs drawn in July and every 3 months   Please have your labs drawn 2 weeks prior to your appointment so that the provider can discuss your lab results at your appointment, if possible.  Please note that you may see your imaging and lab results in MyChart before we have reviewed them. We will contact you once all results are reviewed. Please allow our office up to 72 hours to thoroughly review all of the results before contacting the office for clarification of your results.  WALK-IN LAB HOURS  Monday through Thursday from 8:00 am -12:30 pm and 1:00 pm-5:00 pm and Friday from 8:00 am-12:00 pm.  Patients with office visits requiring labs will be seen before walk-in labs.  You may encounter longer than normal wait times. Please allow additional time. Wait times may be shorter on  Monday and Thursday afternoons.  We do not book appointments for walk-in labs. We appreciate your patience and understanding with our staff.   Labs are drawn by [facility]. Please bring your co-pay at the time of your lab draw.  You may receive a bill from [facility] for your lab work.  Please note if you are on Hydroxychloroquine and and an order has been placed for a Hydroxychloroquine level,  you will need to have it drawn 4 hours or more after your last dose.  If you wish to have your labs drawn at another location, please call the office 24 hours in advance so we can fax the orders.  The office is located at [address], [place], [place] [address]   If you have any questions regarding directions or hours of operation,  please call [phone].   As a reminder, please drink plenty of water prior to coming for your lab work. Thanks!  Exercises for Chronic Knee Pain Chronic knee pain is pain that lasts longer than 3 months. For most people with chronic knee pain, exercise and weight loss is an important part of  treatment. Your health care provider may want you to focus on: Strengthening the muscles that support your knee. This can take pressure off your knee and lessen pain. Preventing knee stiffness. Maintaining or increasing how far you can move your knee. Losing weight (if this applies) to take pressure off your knee, decrease your risk for injury, and make it easier for you to exercise. Your health care provider will help you develop an exercise program that matches your needs and physical abilities. Below are simple, low-impact exercises you can do at home. Ask your health care provider or a physical therapist how often you should do your exercise program and how many times to repeat each exercise. General safety tips Follow these safety tips for exercising with chronic knee pain: Get your health care provider's approval before doing any exercises. Start slowly and stop any time an exercise causes pain. Do not exercise if your knee pain is flaring up. Warm up first. Stretching a cold muscle can cause an injury. Do 5-10 minutes of easy movement or light stretching before beginning your exercise routine. Do 5-10 minutes of low-impact activity (like walking or cycling) before starting strengthening exercises. Contact your health care provider any time you have pain during or after exercising. Exercise may cause discomfort but should not be painful. It is normal to be a little stiff or sore after exercising.  Stretching and range-of-motion exercises Front thigh stretch  Stand up straight and support your body by holding on to a chair or resting one hand on a wall. With your legs straight and close together, bend one knee to lift your heel up toward your buttocks. Using one hand for support, grab your ankle with your free hand. Pull your foot up closer toward your buttocks to feel the stretch in front of your thigh. Hold the stretch for 30 seconds. Repeat __________ times. Complete this exercise  __________ times a day. Back thigh stretch  Sit on the floor with your back straight and your legs out straight in front of you. Place the palms of your hands on the floor and slide them toward your feet as you bend at the hip. Try to touch your nose to your knees and feel the stretch in the back of your thighs. Hold for 30 seconds. Repeat __________ times. Complete this exercise __________ times a day. Calf stretch  Stand facing a wall. Place the palms of your hands flat against the wall, arms extended, and lean slightly against the wall. Get into a lunge position with one leg bent at the knee and the other leg stretched out straight behind you. Keep both feet facing the wall and increase the bend in your knee while keeping the heel of the other leg flat on the ground. You should feel the stretch in your calf. Hold for 30 seconds. Repeat __________ times. Complete this exercise __________ times a day. Strengthening exercises Straight leg lift Lie on your back with one knee bent and the other leg out straight. Slowly lift the straight leg without bending the knee. Lift until your foot is about 12 inches (30 cm) off the floor. Hold for 3-5 seconds and slowly lower your leg. Repeat __________ times. Complete this exercise __________ times a day. Single leg dip Stand between two chairs and put both hands on the backs of the chairs for support. Extend one leg out straight with your body weight resting on the heel of the standing leg. Slowly bend your standing knee to dip your body to the level that is comfortable for you. Hold for 3-5 seconds. Repeat __________ times. Complete this exercise __________ times a day. Hamstring curls Stand straight, knees close together, facing the back of a chair. Hold on to the back of a chair with both hands. Keep one leg straight. Bend the other knee while bringing the heel up toward the buttock until the knee is bent at a 90-degree angle (right  angle). Hold for 3-5 seconds. Repeat __________ times. Complete this exercise __________ times a day. Wall squat Stand straight with your back, hips, and head against a wall. Step forward one foot at a time with your back still against the wall. Your feet should be 2 feet (61 cm) from the wall at shoulder width. Keeping your back, hips, and head against the wall, slide down the wall to as close of a sitting position as you can get. Hold for 5-10 seconds, then slowly slide back up. Repeat __________ times. Complete this exercise __________ times a day. Step-ups Step up with one foot onto a sturdy platform or stool that is about 6 inches (15 cm) high. Face sideways with one foot on the platform and one on the ground. Place all your weight on the platform foot and lift your body off the ground until your knee extends. Let your other leg hang free to the side. Hold for 3-5 seconds then slowly lower your weight down to  the floor foot. Repeat __________ times. Complete this exercise __________ times a day. Contact a health care provider if: Your exercise causes pain. Your pain is worse after you exercise. Your pain prevents you from doing your exercises. This information is not intended to replace advice given to you by your health care provider. Make sure you discuss any questions you have with your health care provider. Document Revised: 10/25/2019 Document Reviewed: 06/18/2019 Elsevier Patient Education  [place].

## 2022-11-22 DIAGNOSIS — L900 Lichen sclerosus et atrophicus: Secondary | ICD-10-CM | POA: Diagnosis not present

## 2022-11-23 NOTE — Progress Notes (Deleted)
Cardiology Office Note:    Date:  11/23/2022   ID:  Annette Suarez, DOB 09-Nov-1940, MRN [id number]  PCP:  Octavia Heir, NP   [facility] Providers Cardiologist:  [name], MD { Click to update primary MD,subspecialty MD or APP then REFRESH:1}    Referring MD: Octavia Heir, NP   Chief Complaint: ***  History of Present Illness:    Annette Suarez is a *** 82 y.o. female with a hx of HTN, HLD, diabetes, OSA, aortic atherosclerosis, left subclavian stenosis with Rt arm blood pressure only, nonobstructive CAD  She was referred to cardiology and seen by Dr. Raynelle Jan 06/11/2020 for variations of blood pressure and upper extremities. In 2022,  she had carotid Doppler that showed moderate left carotid stenosis, left subclavian artery was noted to be stenosed with retrograde flow in the left vertebral artery.  CTA of the chest and aorta showed severe stenosis of the proximal left subclavian artery. Seen by Dr. Kirke Corin for PAD. Noted to have 20 mm systolic gradient b/t right and left arm, diminished left radial pulse, asymptomatic and no indication for revascularization.  Previously on amlodipine but did not tolerate due to leg swelling.  TTE 07/08/2020 revealed LVEF greater than 75%, no RWMA, G1 DD, mildly elevated pulmonary artery systolic pressure, mild MR, mild aortic sclerosis with no evidence of stenosis.  Coronary CTA 09/2021 revealed coronary calcium score 1312 (94th percentile), moderate atherosclerosis (not significant by FFR), PFO present. She has maintained consistent follow-up.  Seen in clinic by Jari Favre, PA 10/13/22 at which time she reported weight loss on Ozempic.  Her husband does most of the heavy lifting due to concerns that she will fall.  When she lays down at night and when she exerts herself she has significant squeezing in her chest that hurts across her shoulders and in her back some as well.  She also has some indigestion with discomfort.  Felt that her exercise  tolerance had decreased significantly.  Symptoms were relieved with nitroglycerin and she was able to fall asleep.  When she goes to the store she is typically out of breath which is new for her. Risks discussed and plan to proceed with cardiac catheterization.  Right and left heart cath 10/22/22 revealed moderate nonobstructive CAD with mid Cx lesion 50% stenosed and dist Cx lesion 50% stenosed, no aortic valve stenosis, normal LV function, mildly elevated LVEDP, upper normal pulmonary artery pressures, heavily calcified oximetry left subclavian.  Today, she is here for follow-up    Past Medical History:  Diagnosis Date   Acute cystitis    UTI   Anxiety    Arthritis    Benign positional vertigo    Dermatitis    Diabetes mellitus without complication (HCC)    Family history of adverse reaction to anesthesia    GERD (gastroesophageal reflux disease)    Goiter, nontoxic, multinodular    Gout    H/O: hysterectomy    History of tobacco use    Hx laparoscopic cholecystectomy    Hypercholesterolemia    Hypertension    Incomplete bladder emptying    Insomnia    Nausea & vomiting 09/08/2017   Postmenopausal atrophic vaginitis    RA (rheumatoid arthritis) (HCC)    Rectocele    Renal calculus    Skin cancer    Sleep apnea    Thyroid condition    Type 2 diabetes mellitus (HCC)    Urolithiasis    Vaginal enterocele  Past Surgical History:  Procedure Laterality Date   AORTIC ARCH ANGIOGRAPHY N/A 10/22/2022   Procedure: AORTIC ARCH ANGIOGRAPHY;  Surgeon: Corky Crafts, MD;  Location: [facility] INVASIVE CV LAB;  Service: Cardiovascular;  Laterality: N/A;   CARPAL TUNNEL RELEASE  2018   CATARACT EXTRACTION, BILATERAL     CHOLECYSTECTOMY     COLONOSCOPY     07/11/2009, 02/2015   GALLBLADDER SURGERY     KNEE ARTHROSCOPY Right 05/20/2020   Procedure: RIGHT KNEE ARTHROSCOPY AND DEBRIDEMENT;  Surgeon: Nadara Mustard, MD;  Location: [facility];  Service: Orthopedics;   Laterality: Right;   PARTIAL HYSTERECTOMY     [place] left intact   RIGHT/LEFT HEART CATH AND CORONARY ANGIOGRAPHY N/A 10/22/2022   Procedure: RIGHT/LEFT HEART CATH AND CORONARY ANGIOGRAPHY;  Surgeon: Corky Crafts, MD;  Location: [facility] INVASIVE CV LAB;  Service: Cardiovascular;  Laterality: N/A;   SQUAMOUS CELL CARCINOMA EXCISION  05/2018    Current Medications: No outpatient medications have been marked as taking for the 11/25/22 encounter (Appointment) with Lissa Hoard Zachary George, NP.     Allergies:   Latex, Amaryl [glimepiride], Amlodipine, Codeine, Jardiance [empagliflozin], Pneumovax 23 [pneumococcal vac polyvalent], and Wellbutrin [bupropion]   Social History   Socioeconomic History   Marital status: Married    Spouse name: Not on file   Number of children: Not on file   Years of education: Not on file   Highest education level: Not on file  Occupational History   Not on file  Tobacco Use   Smoking status: Former    Packs/day: 0.20    Years: 10.00    Additional pack years: 0.00    Total pack years: 2.00    Types: Cigarettes    Quit date: 07/05/1978    Years since quitting: 44.4    Passive exposure: Past   Smokeless tobacco: Never  Vaping Use   Vaping Use: Never used  Substance and Sexual Activity   Alcohol use: No   Drug use: No   Sexual activity: Not Currently  Other Topics Concern   Not on file  Social History Narrative   Diet: Blank      Do you drink/ eat things with caffeine? Yes      Marital status:  Married                             What year were you married ? 196      Do you live in a house, apartment,assistred living, condo, trailer, etc.)? House      Is it one or more stories? 1      How many persons live in your home ? 2      Do you have any pets in your home ?(please list)  1 Sam-Dog      Highest Level of education completed: 12      Current or past profession: Office      Do you exercise?   Some                           Type & how  often  Walk Daily      ADVANCED DIRECTIVES (Please bring copies)      Do you have a living will? Blank      Do you have a DNR form?  Blank  If not, do you want to discuss one?       Do you have signed POA?HPOA forms?  Yes               If so, please bring to your appointment      FUNCTIONAL STATUS- To be completed by Spouse / child / Staff       Do you have difficulty bathing or dressing yourself ?  No      Do you have difficulty preparing food or eating ?  No      Do you have difficulty managing your mediation ?  No      Do you have difficulty managing your finances ?  No      Do you have difficulty affording your medication ?  Yes (Some)      Social Determinants of Health   Financial Resource Strain: Not on file  Food Insecurity: Not on file  Transportation Needs: Not on file  Physical Activity: Not on file  Stress: Not on file  Social Connections: Not on file     Family History: The patient's ***family history includes Crohn's disease in her son; Diabetes in her mother and son; Heart disease (age of onset: 28) in her father.  ROS:   Please see the history of present illness.    *** All other systems reviewed and are negative.  Labs/Other Studies Reviewed:    The following studies were reviewed today:  Cardiac Studies & Procedures   CARDIAC CATHETERIZATION  CARDIAC CATHETERIZATION 10/22/2022  Narrative   Mid Cx lesion is 50% stenosed.  Moderate calcification.   Dist Cx lesion is 50% stenosed.   The left ventricular systolic function is normal.   LV end diastolic pressure is mildly elevated.  LVEDP 21 mmHg.   The left ventricular ejection fraction is greater than 65% by visual estimate.   There is no aortic valve stenosis.   On 2L Carbon Aortic saturation 100%, PA saturation 79%, RA pressure 17/13, mean RA pressure 12 mmHg, PA pressure 39/19, mean PA pressure 25 mmHg, pulmonary capillary wedge pressure 22/22, mean pulmonary capillary wedge  pressure 22 mmHg, cardiac output 6 L/min, cardiac index 3.5.   Heavily calcified proximal left subclavian with delayed filling.  Nonobstructive coronary artery disease.  Upper normal pulmonary artery pressures.  Heavily calcified proximal left subclavian.  Findings Coronary Findings Diagnostic  Dominance: Right  Left Anterior Descending The vessel exhibits minimal luminal irregularities.  Left Circumflex Mid Cx lesion is 50% stenosed. The lesion is eccentric. Dist Cx lesion is 50% stenosed.  Intervention  No interventions have been documented.     ECHOCARDIOGRAM  ECHOCARDIOGRAM COMPLETE 07/08/2020  Narrative ECHOCARDIOGRAM REPORT    Patient Name:   TRINITY KILLELEA  Date of Exam: 07/08/2020 Medical Rec #:  [id number]     Height:       61.0 in Accession #:    [id number]    Weight:       158.0 lb Date of Birth:  1940-12-05     BSA:          1.709 m Patient Age:    55 years      BP:           140/92 mmHg Patient Gender: F             HR:           90 bpm. Exam Location:  [address]  Procedure: 2D Echo, 3D Echo, Cardiac Doppler, Color Doppler  and Strain Analysis  Indications:    I10 Hypertension  History:        Patient has no prior history of Echocardiogram examinations. Risk Factors:Diabetes, Sleep Apnea, Former Smoker and Dyslipidemia.  Sonographer:    Garald Braver, RDCS Referring Phys: [id number] [facility] A CHANDRASEKHAR  IMPRESSIONS   1. Left ventricular ejection fraction, by estimation, is >75%. The left ventricle has hyperdynamic function. The left ventricle has no regional wall motion abnormalities. Left ventricular diastolic parameters are consistent with Grade I diastolic dysfunction (impaired relaxation). Elevated left atrial pressure. 2. Right ventricular systolic function is normal. The right ventricular size is normal. There is mildly elevated pulmonary artery systolic pressure. 3. The mitral valve is normal in structure. Mild mitral valve regurgitation. No  evidence of mitral stenosis. 4. The aortic valve is tricuspid. Aortic valve regurgitation is trivial. Mild aortic valve sclerosis is present, with no evidence of aortic valve stenosis. 5. The inferior vena cava is normal in size with greater than 50% respiratory variability, suggesting right atrial pressure of 3 mmHg.  FINDINGS Left Ventricle: Left ventricular ejection fraction, by estimation, is >75%. The left ventricle has hyperdynamic function. The left ventricle has no regional wall motion abnormalities. The left ventricular internal cavity size was normal in size. There is no left ventricular hypertrophy. Left ventricular diastolic parameters are consistent with Grade I diastolic dysfunction (impaired relaxation). Elevated left atrial pressure.  Right Ventricle: The right ventricular size is normal. Right ventricular systolic function is normal. There is mildly elevated pulmonary artery systolic pressure. The tricuspid regurgitant velocity is 2.97 m/s, and with an assumed right atrial pressure of 3 mmHg, the estimated right ventricular systolic pressure is 38.3 mmHg.  Left Atrium: Left atrial size was normal in size.  Right Atrium: Right atrial size was normal in size.  Pericardium: There is no evidence of pericardial effusion.  Mitral Valve: The mitral valve is normal in structure. Mild mitral valve regurgitation. No evidence of mitral valve stenosis.  Tricuspid Valve: The tricuspid valve is normal in structure. Tricuspid valve regurgitation is mild . No evidence of tricuspid stenosis.  Aortic Valve: The aortic valve is tricuspid. Aortic valve regurgitation is trivial. Mild aortic valve sclerosis is present, with no evidence of aortic valve stenosis.  Pulmonic Valve: The pulmonic valve was normal in structure. Pulmonic valve regurgitation is trivial. No evidence of pulmonic stenosis.  Aorta: The aortic root is normal in size and structure.  Venous: The inferior vena cava is normal in  size with greater than 50% respiratory variability, suggesting right atrial pressure of 3 mmHg.  IAS/Shunts: No atrial level shunt detected by color flow Doppler.   LEFT VENTRICLE PLAX 2D LVIDd:         4.00 cm  Diastology LVIDs:         2.80 cm  LV e' medial:    4.28 cm/s LV PW:         0.90 cm  LV E/e' medial:  23.1 LV IVS:        0.90 cm  LV e' lateral:   5.46 cm/s LVOT diam:     2.00 cm  LV E/e' lateral: 18.1 LV SV:         75 LV SV Index:   44       2D Longitudinal Strain LVOT Area:     3.14 cm 2D Strain GLS (A2C):   -15.6 % 2D Strain GLS (A3C):   -15.9 % 2D Strain GLS (A4C):   -17.2 % 2D Strain  GLS Avg:     -16.2 %  3D Volume EF: 3D EF:        58 % LV EDV:       85 ml LV ESV:       36 ml LV SV:        49 ml  RIGHT VENTRICLE RV Basal diam:  3.20 cm RV S prime:     12.20 cm/s TAPSE (M-mode): 1.4 cm RVSP:           38.3 mmHg  LEFT ATRIUM             Index       RIGHT ATRIUM           Index LA diam:        4.00 cm 2.34 cm/m  RA Pressure: 3.00 mmHg LA Vol (A2C):   32.1 ml 18.79 ml/m RA Area:     7.38 cm LA Vol (A4C):   29.9 ml 17.50 ml/m RA Volume:   11.20 ml  6.55 ml/m LA Biplane Vol: 31.7 ml 18.55 ml/m AORTIC VALVE LVOT Vmax:   102.00 cm/s LVOT Vmean:  70.400 cm/s LVOT VTI:    0.239 m  AORTA Ao Root diam: 3.20 cm  MITRAL VALVE                TRICUSPID VALVE TR Peak grad:   35.3 mmHg TR Vmax:        297.00 cm/s MV E velocity: 99.00 cm/s   Estimated RAP:  3.00 mmHg MV A velocity: 102.00 cm/s  RVSP:           38.3 mmHg MV E/A ratio:  0.97 SHUNTS Systemic VTI:  0.24 m Systemic Diam: 2.00 cm  Olga Millers MD Electronically signed by Olga Millers MD Signature Date/Time: 07/08/2020/11:39:54 AM    Final     CT SCANS  CT CORONARY MORPH W/CTA COR W/SCORE 09/22/2021  Addendum 09/22/2021  8:01 AM ADDENDUM REPORT: 09/22/2021 07:59  EXAM: OVER-READ INTERPRETATION  CT CHEST  The following report is an over-read performed by radiologist Dr. Royal Piedra [facility] on 09/21/2021. This over-read does not include interpretation of cardiac or coronary anatomy or pathology. The coronary calcium score and cardiac CTA interpretation by the cardiologist is attached.  COMPARISON:  Chest CTA 07/08/2020.  FINDINGS: Atherosclerotic calcifications in the thoracic aorta. Within the visualized portions of the thorax there are no suspicious appearing pulmonary nodules or masses, there is no acute consolidative airspace disease, no pleural effusions, no pneumothorax and no lymphadenopathy. Visualized portions of the upper abdomen are unremarkable. There are no aggressive appearing lytic or blastic lesions noted in the visualized portions of the skeleton.  IMPRESSION: 1.  Aortic Atherosclerosis (ICD10-I70.0).   Electronically Signed By: Trudie Reed M.D. On: 09/22/2021 07:59  Narrative CLINICAL DATA:  Chest pain  EXAM: Cardiac/Coronary CTA  TECHNIQUE: A non-contrast, gated CT scan was obtained with axial slices of 3 mm through the heart for calcium scoring. Calcium scoring was performed using the Agatston method. A 120 kV prospective, gated, contrast cardiac scan was obtained. Gantry rotation speed was 250 msecs and collimation was 0.6 mm. Two sublingual nitroglycerin tablets (0.8 mg) were given. The 3D data set was reconstructed in 5% intervals of the 35-75% of the R-R cycle. Diastolic phases were analyzed on a dedicated workstation using MPR, MIP, and VRT modes. The patient received 95 cc of contrast.  FINDINGS: Image quality: Excellent.  Noise artifact is: Limited.  Coronary Arteries:  Normal coronary  origin.  Right dominance.  Left main: The left main is a large caliber vessel with a normal take off from the left coronary cusp that bifurcates to form a left anterior descending artery and a left circumflex artery. There is mild calcified plaque in the proximal to mid LM with associated stenosis of  25-49%.  Left anterior descending artery: The LAD gives off 2 patent diagonal branches. There is mild calcified plaque in the proximal LAD with associated stenosis of 25-49%. There is moderate calcified plaque in the mid LAD after the takeoff of D1 with associated stenosis of 50-69%.  Left circumflex artery: The LCX is non-dominant and gives off 2 patent obtuse marginal branches. There is mild to moderate calcified plaque in the mid LCx and in a large proximal OM2 with associated stenosis of at least 25-49% and may be > 50%.  Right coronary artery: The RCA is dominant with normal take off from the right coronary cusp. The RCA terminates as a PDA and right posterolateral branch. There is moderate mixed plaque in the proximal RCA with associated stenosis of 50-69%. There is moderate calcified plaque in the distal RCA with associated stenosis of 50-69%  Right Atrium: Right atrial size is within normal limits.  Right Ventricle: The right ventricular cavity is within normal limits.  Left Atrium: Left atrial size is normal in size with no left atrial appendage filling defect. PFO is present.  Left Ventricle: The ventricular cavity size is within normal limits. There are no stigmata of prior infarction. There is no abnormal filling defect.  Pulmonary arteries: Normal in size without proximal filling defect.  Pulmonary veins: Normal pulmonary venous drainage.  Pericardium: Normal thickness with no significant effusion or calcium present.  Cardiac valves: The aortic valve is trileaflet without significant calcification. The mitral valve is normal structure without significant calcification.  Aorta: Normal caliber with aortic atherosclerosis.  Extra-cardiac findings: See attached radiology report for non-cardiac structures.  IMPRESSION: 1. Coronary calcium score of 1312. This was 94th percentile for age-, sex, and race-matched controls.  2.  Normal coronary origin with right  dominance.  3.  Moderate atherosclerosis.  CAD RADS 3.  4.  PFO is present.  5.  Aortic atherosclerosis.  6. Consider symptom-guided anti-ischemic and preventive pharmacotherapy as well as risk factor modification per guideline-directed care.  7.  This study has been submitted for FFR analysis.  RECOMMENDATIONS: 1. CAD-RADS 0: No evidence of CAD (0%). Consider non-atherosclerotic causes of chest pain.  2. CAD-RADS 1: Minimal non-obstructive CAD (0-24%). Consider non-atherosclerotic causes of chest pain. Consider preventive therapy and risk factor modification.  3. CAD-RADS 2: Mild non-obstructive CAD (25-49%). Consider non-atherosclerotic causes of chest pain. Consider preventive therapy and risk factor modification.  4. CAD-RADS 3: Moderate stenosis. Consider symptom-guided anti-ischemic pharmacotherapy as well as risk factor modification per guideline directed care. Additional analysis with CT FFR will be submitted.  5. CAD-RADS 4: Severe stenosis. (70-99% or > 50% left main). Cardiac catheterization or CT FFR is recommended. Consider symptom-guided anti-ischemic pharmacotherapy as well as risk factor modification per guideline directed care. Invasive coronary angiography recommended with revascularization per published guideline statements.  6. CAD-RADS 5: Total coronary occlusion (100%). Consider cardiac catheterization or viability assessment. Consider symptom-guided anti-ischemic pharmacotherapy as well as risk factor modification per guideline directed care.  7. CAD-RADS N: Non-diagnostic study. Obstructive CAD can't be excluded. Alternative evaluation is recommended.  Armanda Magic, MD  Electronically Signed: By: Armanda Magic M.D. On: 09/21/2021 09:37  Recent Labs: 06/24/2022: ALT 9 10/14/2022: BUN 30; Creatinine, Ser 1.17; Platelets 330 10/22/2022: Hemoglobin 11.6; Potassium 3.9; Sodium 140  Recent Lipid Panel    Component Value Date/Time    CHOL 112 08/06/2021 0929   TRIG 146 08/06/2021 0929   HDL 39 (L) 08/06/2021 0929   CHOLHDL 2.9 08/06/2021 0929   LDLCALC 50 08/06/2021 0929     Risk Assessment/Calculations:   {Does this patient have ATRIAL FIBRILLATION?:[phone]}       Physical Exam:    VS:  There were no vitals taken for this visit.    Wt Readings from Last 3 Encounters:  11/15/22 157 lb 12.8 oz (71.6 kg)  11/02/22 158 lb 8 oz (71.9 kg)  10/21/22 157 lb 12.8 oz (71.6 kg)     GEN: *** Well nourished, well developed in no acute distress HEENT: Normal NECK: No JVD; No carotid bruits CARDIAC: ***RRR, no murmurs, rubs, gallops RESPIRATORY:  Clear to auscultation without rales, wheezing or rhonchi  ABDOMEN: Soft, non-tender, non-distended MUSCULOSKELETAL:  No edema; No deformity. *** pedal pulses, ***bilaterally SKIN: Warm and dry NEUROLOGIC:  Alert and oriented x 3 PSYCHIATRIC:  Normal affect   EKG:  EKG is *** ordered today.  The ekg ordered today demonstrates ***  No BP recorded.  {Refresh Note OR Click here to enter BP  :1}***    Diagnoses:    No diagnosis found. Assessment and Plan:     CAD: Hyperlipidemia LDL goal < 55: LDL  Subclavian stenosis: Hypertension:   {Are you ordering a CV Procedure (e.g. stress test, cath, DCCV, TEE, etc)?   Press F2        :[id number]}   Disposition:  Medication Adjustments/Labs and Tests Ordered: Current medicines are reviewed at length with the patient today.  Concerns regarding medicines are outlined above.  No orders of the defined types were placed in this encounter.  No orders of the defined types were placed in this encounter.   There are no Patient Instructions on file for this visit.   Signed, Levi Aland, NP  11/23/2022 4:52 PM    [facility]

## 2022-11-24 ENCOUNTER — Telehealth: Payer: Self-pay | Admitting: *Deleted

## 2022-11-24 NOTE — Telephone Encounter (Signed)
Received a potential drug-age interaction from [facility].   Possible interaction between: Methocarbamol >= 80 years old  Possible Risks: Muscle relaxants such as Methocarbamol are poorly tolerated due to anticholinergic effects, risk of sedation, increased risk of fracture and have question efficacy in patients 60 years of age and older. If possible, please avoid use in this population.   Reviewed by: Sherron Ales, PA=C  Recommendations: Advise patient to take Robaxin only PRN.   Notified patient of possible interactions between Methocarbamol and being over the age of 80 years old. Advised risk include: risk of sedation, increased risk of fracture and have question efficacy in patients 67 years of age and older.. Patient advised of the following recommendations: use the Methocarbamol only as needed. Patient expressed understanding.

## 2022-11-25 ENCOUNTER — Ambulatory Visit: Payer: Medicare Other | Admitting: Nurse Practitioner

## 2022-12-01 NOTE — Progress Notes (Signed)
Office Visit    Patient Name: Annette Suarez Date of Encounter: 12/01/2022  Primary Care Provider:  Octavia Heir, NP Primary Cardiologist:  [name], MD Primary Electrophysiologist: None   Past Medical History    Past Medical History:  Diagnosis Date   Acute cystitis    UTI   Anxiety    Arthritis    Benign positional vertigo    Dermatitis    Diabetes mellitus without complication (HCC)    Family history of adverse reaction to anesthesia    GERD (gastroesophageal reflux disease)    Goiter, nontoxic, multinodular    Gout    H/O: hysterectomy    History of tobacco use    Hx laparoscopic cholecystectomy    Hypercholesterolemia    Hypertension    Incomplete bladder emptying    Insomnia    Nausea & vomiting 09/08/2017   Postmenopausal atrophic vaginitis    RA (rheumatoid arthritis) (HCC)    Rectocele    Renal calculus    Skin cancer    Sleep apnea    Thyroid condition    Type 2 diabetes mellitus (HCC)    Urolithiasis    Vaginal enterocele    Past Surgical History:  Procedure Laterality Date   AORTIC ARCH ANGIOGRAPHY N/A 10/22/2022   Procedure: AORTIC ARCH ANGIOGRAPHY;  Surgeon: Corky Crafts, MD;  Location: [facility];  Service: Cardiovascular;  Laterality: N/A;   CARPAL TUNNEL RELEASE  2018   CATARACT EXTRACTION, BILATERAL     CHOLECYSTECTOMY     COLONOSCOPY     07/11/2009, 02/2015   GALLBLADDER SURGERY     KNEE ARTHROSCOPY Right 05/20/2020   Procedure: RIGHT KNEE ARTHROSCOPY AND DEBRIDEMENT;  Surgeon: Nadara Mustard, MD;  Location: [facility];  Service: Orthopedics;  Laterality: Right;   PARTIAL HYSTERECTOMY     [place] left intact   RIGHT/LEFT HEART CATH AND CORONARY ANGIOGRAPHY N/A 10/22/2022   Procedure: RIGHT/LEFT HEART CATH AND CORONARY ANGIOGRAPHY;  Surgeon: Corky Crafts, MD;  Location: [facility];  Service: Cardiovascular;  Laterality: N/A;   SQUAMOUS CELL CARCINOMA EXCISION  05/2018     Allergies  Allergies  Allergen Reactions   Latex Rash   Amaryl [Glimepiride]     Don't Remember   Amlodipine     LE edema on 7.5mg  daily. Tolerates 5mg  daily just fine   Codeine     SICK   Jardiance [Empagliflozin]     Urinary tract infection- SIDE EFFECT   Pneumovax 23 [Pneumococcal Vac Polyvalent]     Red / Swelling / Hot to touch SIDE EFFECTS   Wellbutrin [Bupropion]     Unknown     History of Present Illness    Annette Suarez  is a 82 year old female with a PMH of aortic atherosclerosis DM type II, GERD, subclavian stenosis, HTN, HLD, OSA (not on CPAP) who presents today for 4-week follow-up.  Annette Suarez was seen initially by Dr. Raynelle Jan in 2021 for abnormal BP elevation in opposite arms.  She had a 20 mm systolic gradient between right and left arm.  Patient underwent CT to rule out subclavian stenosis and was found to have left subclavian artery stenosis that was severe.  Carotid doppler 07/16/20 showed moderate left carotid stenosis. She was referred to Dr. Kirke Corin for further management.  She was found to not have any indications for revascularization.  She was advised to continue low-dose aspirin and aggressive treatment of risk factors.  She was referred to the hypertension clinic for management of BP.  She had amlodipine increased to 5 mg daily.  She was seen in 09/2021 for complaint of chest pain.  She underwent coronary CTA with FFR that showed no flow-limiting lesions and recommendation of aggressive medical therapy.  She was seen by Jari Favre, PA on 10/14/2022 and described chest discomfort that occurred while laying down and was squeezing in nature.  She was sent for right and left heart cath that showed no obstructive CAD with 50% stenosed  mid and distal upper normal pulmonary artery pressures and heavy calcified proximal left subclavian. 60-70 Annette Suarez presents today for post heart cath follow-up.  Since last being seen in the office patient reports she is feeling  much better and reports no recurrence of chest pain.  Her blood pressure today was initially elevated at 162/78 and was 142/76 on recheck.  She reports an element of whitecoat hypertension that occurs at doctors appointments.  She reports at home her blood pressures are well-controlled.  During our visit we reviewed her results of the right and left heart catheterization and discussed the findings in detail.  She had all questions answered to her satisfaction.  She notes some concerns with left hand grip weakness and I advised her to follow-up with her PCP or rheumatologist for further evaluation..  Patient denies chest pain, palpitations, dyspnea, PND, orthopnea, nausea, vomiting, dizziness, syncope, edema, weight gain, or early satiety.  Home Medications    Current Outpatient Medications  Medication Sig Dispense Refill   Accu-Chek Softclix Lancets lancets Use to test blood sugar three times daily. Dx: E11.8 300 each 1   aspirin EC 81 MG tablet Take 81 mg by mouth daily.     Biotin 1 MG CAPS Take 1 mg by mouth.     carvedilol (COREG) 6.25 MG tablet TAKE ONE TABLET BY MOUTH TWICE A DAY 180 tablet 3   chlorthalidone (HYGROTON) 25 MG tablet Take 1 tablet (25 mg total) by mouth daily. 90 tablet 3   Cholecalciferol (VITAMIN D3) 50 MCG (2000 UT) capsule Take 2,000 Units by mouth daily.     clobetasol cream (TEMOVATE) 0.05 % Apply 1 Application topically as needed.     Continuous Blood Gluc Receiver (FREESTYLE LIBRE 2 READER) DEVI      Continuous Glucose Sensor (FREESTYLE LIBRE 2 SENSOR) MISC USE TO CHECK BLOOD SUGAR - CHECK EVERY 14 DAYS 2 each 11   CRANBERRY EXTRACT PO Take 650 mg by mouth daily.     Diclofenac Sodium (VOLTAREN EX) Apply 1 application  topically 3 (three) times daily as needed (Arthritis).     estradiol (ESTRACE) 0.1 MG/GM vaginal cream Place 1 Applicatorful vaginally 3 (three) times a week. 42.5 g 0   etanercept (ENBREL SURECLICK) 50 MG/ML injection Inject 50 mg into the skin once a  week. 12 mL 0   fluocinonide (LIDEX) 0.05 % external solution Apply 1 Application topically daily as needed. 60 mL 3   insulin degludec (TRESIBA FLEXTOUCH) 200 UNIT/ML FlexTouch Pen Inject 60 Units into the skin daily in the afternoon. 60 mL 6   Insulin Pen Needle 31G X 5 MM MISC 1 Device by Does not apply route daily in the afternoon. 100 each 3   irbesartan (AVAPRO) 300 MG tablet Take 1 tablet (300 mg total) by mouth daily. Please call [phone] to schedule an appointment for July for future refills thank you. 90 tablet 0   loperamide (IMODIUM A-D) 2 MG tablet Take 2  mg by mouth daily as needed for diarrhea or loose stools.     LORazepam (ATIVAN) 0.5 MG tablet TAKE ONE TABLET BY MOUTH EVERY NIGHT AT BEDTIME 30 tablet 0   MAGNESIUM CITRATE PO Take 250 mg by mouth daily as needed (leg cramps).     MELATONIN PO Take 10 mg by mouth at bedtime as needed (Sleep).     methocarbamol (ROBAXIN) 500 MG tablet Take 1 tablet (500 mg total) by mouth daily as needed for muscle spasms. 30 tablet 0   Multiple Vitamins-Minerals (HAIR SKIN & NAILS PO) Take 1 tablet by mouth 2 (two) times daily.     nitroGLYCERIN (NITROSTAT) 0.4 MG SL tablet Place 1 tablet (0.4 mg total) under the tongue every 5 (five) minutes as needed for chest pain. 25 tablet 3   Omeprazole 20 MG TBDD Take 20 mg by mouth daily.     ondansetron (ZOFRAN) 4 MG tablet Take 1 tablet (4 mg total) by mouth 3 (three) times daily as needed for nausea or vomiting. 20 tablet 0   Probiotic Product (PROBIOTIC PO) Take 1 tablet by mouth daily.     Propylene Glycol (SYSTANE COMPLETE OP) Place 1-2 drops into both eyes daily as needed (Dry eye).     Semaglutide,0.25 or 0.5MG /DOS, (OZEMPIC, 0.25 OR 0.5 MG/DOSE,) 2 MG/1.5ML SOPN Inject 0.5 mg into the skin once a week. 3 mL 0   simvastatin (ZOCOR) 20 MG tablet TAKE ONE TABLET BY MOUTH EVERY EVENING 90 tablet 3   No current facility-administered medications for this visit.     Review of Systems  Please see  the history of present illness.    (+) Decreased grip strength on the left All other systems reviewed and are otherwise negative except as noted above.  Physical Exam    Wt Readings from Last 3 Encounters:  11/15/22 157 lb 12.8 oz (71.6 kg)  11/02/22 158 lb 8 oz (71.9 kg)  10/21/22 157 lb 12.8 oz (71.6 kg)   YQ:MVHQI were no vitals filed for this visit.,There is no height or weight on file to calculate BMI.  Constitutional:      Appearance: Healthy appearance. Not in distress.  Neck:     Vascular: JVD normal.  Pulmonary:     Effort: Pulmonary effort is normal.     Breath sounds: No wheezing. No rales. Diminished in the bases Cardiovascular:     Normal rate. Regular rhythm. Normal S1. Normal S2.  Patient's right cath site is clean dry and intact with no evidence of ecchymosis or hematoma.    Murmurs: There is no murmur.  Edema:    Peripheral edema absent.  Abdominal:     Palpations: Abdomen is soft non tender. There is no hepatomegaly.  Skin:    General: Skin is warm and dry.  Neurological:     General: No focal deficit present.     Mental Status: Alert and oriented to person, place and time.     Cranial Nerves: Cranial nerves are intact.  Patient has decreased grip strength in the left 4/5 and 5 out of 5 on the right EKG/LABS/ Recent Cardiac Studies    ECG personally reviewed by me today -none completed today  Cardiac Studies & Procedures   CARDIAC CATHETERIZATION  CARDIAC CATHETERIZATION 10/22/2022  Narrative   Mid Cx lesion is 50% stenosed.  Moderate calcification.   Dist Cx lesion is 50% stenosed.   The left ventricular systolic function is normal.   LV end diastolic pressure is mildly elevated.  LVEDP 21 mmHg.   The left ventricular ejection fraction is greater than 65% by visual estimate.   There is no aortic valve stenosis.   On 2L [place] Aortic saturation 100%, PA saturation 79%, RA pressure 17/13, mean RA pressure 12 mmHg, PA pressure 39/19, mean PA pressure 25  mmHg, pulmonary capillary wedge pressure 22/22, mean pulmonary capillary wedge pressure 22 mmHg, cardiac output 6 L/min, cardiac index 3.5.   Heavily calcified proximal left subclavian with delayed filling.  Nonobstructive coronary artery disease.  Upper normal pulmonary artery pressures.  Heavily calcified proximal left subclavian.  Findings Coronary Findings Diagnostic  Dominance: Right  Left Anterior Descending The vessel exhibits minimal luminal irregularities.  Left Circumflex Mid Cx lesion is 50% stenosed. The lesion is eccentric. Dist Cx lesion is 50% stenosed.  Intervention  No interventions have been documented.     ECHOCARDIOGRAM  ECHOCARDIOGRAM COMPLETE 07/08/2020  Narrative ECHOCARDIOGRAM REPORT    Patient Name:   STEVI CALK  Date of Exam: 07/08/2020 Medical Rec #:  [id number]     Height:       61.0 in Accession #:    [id number]    Weight:       158.0 lb Date of Birth:  1941/07/01     BSA:          1.709 m Patient Age:    43 years      BP:           140/92 mmHg Patient Gender: F             HR:           90 bpm. Exam Location:  [address]  Procedure: 2D Echo, 3D Echo, Cardiac Doppler, Color Doppler and Strain Analysis  Indications:    I10 Hypertension  History:        Patient has no prior history of Echocardiogram examinations. Risk Factors:Diabetes, Sleep Apnea, Former Smoker and Dyslipidemia.  Sonographer:    Garald Braver, RDCS Referring Phys: [id number] [facility]  IMPRESSIONS   1. Left ventricular ejection fraction, by estimation, is >75%. The left ventricle has hyperdynamic function. The left ventricle has no regional wall motion abnormalities. Left ventricular diastolic parameters are consistent with Grade I diastolic dysfunction (impaired relaxation). Elevated left atrial pressure. 2. Right ventricular systolic function is normal. The right ventricular size is normal. There is mildly elevated pulmonary artery systolic pressure. 3.  The mitral valve is normal in structure. Mild mitral valve regurgitation. No evidence of mitral stenosis. 4. The aortic valve is tricuspid. Aortic valve regurgitation is trivial. Mild aortic valve sclerosis is present, with no evidence of aortic valve stenosis. 5. The inferior vena cava is normal in size with greater than 50% respiratory variability, suggesting right atrial pressure of 3 mmHg.  FINDINGS Left Ventricle: Left ventricular ejection fraction, by estimation, is >75%. The left ventricle has hyperdynamic function. The left ventricle has no regional wall motion abnormalities. The left ventricular internal cavity size was normal in size. There is no left ventricular hypertrophy. Left ventricular diastolic parameters are consistent with Grade I diastolic dysfunction (impaired relaxation). Elevated left atrial pressure.  Right Ventricle: The right ventricular size is normal. Right ventricular systolic function is normal. There is mildly elevated pulmonary artery systolic pressure. The tricuspid regurgitant velocity is 2.97 m/s, and with an assumed right atrial pressure of 3 mmHg, the estimated right ventricular systolic pressure is 38.3 mmHg.  Left Atrium: Left atrial size was normal in size.  Right Atrium: Right  atrial size was normal in size.  Pericardium: There is no evidence of pericardial effusion.  Mitral Valve: The mitral valve is normal in structure. Mild mitral valve regurgitation. No evidence of mitral valve stenosis.  Tricuspid Valve: The tricuspid valve is normal in structure. Tricuspid valve regurgitation is mild . No evidence of tricuspid stenosis.  Aortic Valve: The aortic valve is tricuspid. Aortic valve regurgitation is trivial. Mild aortic valve sclerosis is present, with no evidence of aortic valve stenosis.  Pulmonic Valve: The pulmonic valve was normal in structure. Pulmonic valve regurgitation is trivial. No evidence of pulmonic stenosis.  Aorta: The aortic root is  normal in size and structure.  Venous: The inferior vena cava is normal in size with greater than 50% respiratory variability, suggesting right atrial pressure of 3 mmHg.  IAS/Shunts: No atrial level shunt detected by color flow Doppler.   LEFT VENTRICLE PLAX 2D LVIDd:         4.00 cm  Diastology LVIDs:         2.80 cm  LV e' medial:    4.28 cm/s LV PW:         0.90 cm  LV E/e' medial:  23.1 LV IVS:        0.90 cm  LV e' lateral:   5.46 cm/s LVOT diam:     2.00 cm  LV E/e' lateral: 18.1 LV SV:         75 LV SV Index:   44       2D Longitudinal Strain LVOT Area:     3.14 cm 2D Strain GLS (A2C):   -15.6 % 2D Strain GLS (A3C):   -15.9 % 2D Strain GLS (A4C):   -17.2 % 2D Strain GLS Avg:     -16.2 %  3D Volume EF: 3D EF:        58 % LV EDV:       85 ml LV ESV:       36 ml LV SV:        49 ml  RIGHT VENTRICLE RV Basal diam:  3.20 cm RV S prime:     12.20 cm/s TAPSE (M-mode): 1.4 cm RVSP:           38.3 mmHg  LEFT ATRIUM             Index       RIGHT ATRIUM           Index LA diam:        4.00 cm 2.34 cm/m  RA Pressure: 3.00 mmHg LA Vol (A2C):   32.1 ml 18.79 ml/m RA Area:     7.38 cm LA Vol (A4C):   29.9 ml 17.50 ml/m RA Volume:   11.20 ml  6.55 ml/m LA Biplane Vol: 31.7 ml 18.55 ml/m AORTIC VALVE LVOT Vmax:   102.00 cm/s LVOT Vmean:  70.400 cm/s LVOT VTI:    0.239 m  AORTA Ao Root diam: 3.20 cm  MITRAL VALVE                TRICUSPID VALVE TR Peak grad:   35.3 mmHg TR Vmax:        297.00 cm/s MV E velocity: 99.00 cm/s   Estimated RAP:  3.00 mmHg MV A velocity: 102.00 cm/s  RVSP:           38.3 mmHg MV E/A ratio:  0.97 SHUNTS Systemic VTI:  0.24 m Systemic Diam: 2.00 cm  Olga Millers MD Electronically signed by Arlys John  Crenshaw MD Signature Date/Time: 07/08/2020/11:39:54 AM    Final     CT SCANS  CT CORONARY MORPH W/CTA COR W/SCORE 09/22/2021  Addendum 09/22/2021  8:01 AM ADDENDUM REPORT: 09/22/2021 07:59  EXAM: OVER-READ INTERPRETATION  CT  CHEST  The following report is an over-read performed by radiologist Dr. Royal Piedra [facility] on 09/21/2021. This over-read does not include interpretation of cardiac or coronary anatomy or pathology. The coronary calcium score and cardiac CTA interpretation by the cardiologist is attached.  COMPARISON:  Chest CTA 07/08/2020.  FINDINGS: Atherosclerotic calcifications in the thoracic aorta. Within the visualized portions of the thorax there are no suspicious appearing pulmonary nodules or masses, there is no acute consolidative airspace disease, no pleural effusions, no pneumothorax and no lymphadenopathy. Visualized portions of the upper abdomen are unremarkable. There are no aggressive appearing lytic or blastic lesions noted in the visualized portions of the skeleton.  IMPRESSION: 1.  Aortic Atherosclerosis (ICD10-I70.0).   Electronically Signed By: Trudie Reed M.D. On: 09/22/2021 07:59  Narrative CLINICAL DATA:  Chest pain  EXAM: Cardiac/Coronary CTA  TECHNIQUE: A non-contrast, gated CT scan was obtained with axial slices of 3 mm through the heart for calcium scoring. Calcium scoring was performed using the Agatston method. A 120 kV prospective, gated, contrast cardiac scan was obtained. Gantry rotation speed was 250 msecs and collimation was 0.6 mm. Two sublingual nitroglycerin tablets (0.8 mg) were given. The 3D data set was reconstructed in 5% intervals of the 35-75% of the R-R cycle. Diastolic phases were analyzed on a dedicated workstation using MPR, MIP, and VRT modes. The patient received 95 cc of contrast.  FINDINGS: Image quality: Excellent.  Noise artifact is: Limited.  Coronary Arteries:  Normal coronary origin.  Right dominance.  Left main: The left main is a large caliber vessel with a normal take off from the left coronary cusp that bifurcates to form a left anterior descending artery and a left circumflex artery. There  is mild calcified plaque in the proximal to mid LM with associated stenosis of 25-49%.  Left anterior descending artery: The LAD gives off 2 patent diagonal branches. There is mild calcified plaque in the proximal LAD with associated stenosis of 25-49%. There is moderate calcified plaque in the mid LAD after the takeoff of D1 with associated stenosis of 50-69%.  Left circumflex artery: The LCX is non-dominant and gives off 2 patent obtuse marginal branches. There is mild to moderate calcified plaque in the mid LCx and in a large proximal OM2 with associated stenosis of at least 25-49% and may be > 50%.  Right coronary artery: The RCA is dominant with normal take off from the right coronary cusp. The RCA terminates as a PDA and right posterolateral branch. There is moderate mixed plaque in the proximal RCA with associated stenosis of 50-69%. There is moderate calcified plaque in the distal RCA with associated stenosis of 50-69%  Right Atrium: Right atrial size is within normal limits.  Right Ventricle: The right ventricular cavity is within normal limits.  Left Atrium: Left atrial size is normal in size with no left atrial appendage filling defect. PFO is present.  Left Ventricle: The ventricular cavity size is within normal limits. There are no stigmata of prior infarction. There is no abnormal filling defect.  Pulmonary arteries: Normal in size without proximal filling defect.  Pulmonary veins: Normal pulmonary venous drainage.  Pericardium: Normal thickness with no significant effusion or calcium present.  Cardiac valves: The aortic valve is trileaflet  without significant calcification. The mitral valve is normal structure without significant calcification.  Aorta: Normal caliber with aortic atherosclerosis.  Extra-cardiac findings: See attached radiology report for non-cardiac structures.  IMPRESSION: 1. Coronary calcium score of 1312. This was 94th percentile  for age-, sex, and race-matched controls.  2.  Normal coronary origin with right dominance.  3.  Moderate atherosclerosis.  CAD RADS 3.  4.  PFO is present.  5.  Aortic atherosclerosis.  6. Consider symptom-guided anti-ischemic and preventive pharmacotherapy as well as risk factor modification per guideline-directed care.  7.  This study has been submitted for FFR analysis.  RECOMMENDATIONS: 1. CAD-RADS 0: No evidence of CAD (0%). Consider non-atherosclerotic causes of chest pain.  2. CAD-RADS 1: Minimal non-obstructive CAD (0-24%). Consider non-atherosclerotic causes of chest pain. Consider preventive therapy and risk factor modification.  3. CAD-RADS 2: Mild non-obstructive CAD (25-49%). Consider non-atherosclerotic causes of chest pain. Consider preventive therapy and risk factor modification.  4. CAD-RADS 3: Moderate stenosis. Consider symptom-guided anti-ischemic pharmacotherapy as well as risk factor modification per guideline directed care. Additional analysis with CT FFR will be submitted.  5. CAD-RADS 4: Severe stenosis. (70-99% or > 50% left main). Cardiac catheterization or CT FFR is recommended. Consider symptom-guided anti-ischemic pharmacotherapy as well as risk factor modification per guideline directed care. Invasive coronary angiography recommended with revascularization per published guideline statements.  6. CAD-RADS 5: Total coronary occlusion (100%). Consider cardiac catheterization or viability assessment. Consider symptom-guided anti-ischemic pharmacotherapy as well as risk factor modification per guideline directed care.  7. CAD-RADS N: Non-diagnostic study. Obstructive CAD can't be excluded. Alternative evaluation is recommended.  Armanda Magic, MD  Electronically Signed: By: Armanda Magic M.D. On: 09/21/2021 09:37          Lab Results  Component Value Date   WBC 11.5 (H) 10/14/2022   HGB 11.6 (L) 10/22/2022   HCT 34.0 (L)  10/22/2022   MCV 94 10/14/2022   PLT 330 10/14/2022   Lab Results  Component Value Date   CREATININE 1.17 (H) 10/14/2022   BUN 30 (H) 10/14/2022   NA 140 10/22/2022   K 3.9 10/22/2022   CL 99 10/14/2022   CO2 27 10/14/2022   Lab Results  Component Value Date   ALT 9 06/24/2022   AST 10 06/24/2022   ALKPHOS 83 07/25/2019   BILITOT 1.0 06/24/2022   Lab Results  Component Value Date   CHOL 112 08/06/2021   HDL 39 (L) 08/06/2021   LDLCALC 50 08/06/2021   TRIG 146 08/06/2021   CHOLHDL 2.9 08/06/2021    Lab Results  Component Value Date   HGBA1C 7.8 (A) 09/10/2022     Assessment & Plan    1.  Nonobstructive CAD: -s/p R/LHC completed for complaint of chest pain that showed nonobstructive CAD with 50% stenosis in the proximal and mid circumflex with nonobstructive and additional arteries. -Today patient reports that she is feeling much better with no complaints of chest pain since her follow-up visit. -Continue GDMT with Zocor 20 mg daily, ASA 81 mg, Coreg 6.25 mg twice daily, as needed Nitrostat  2.  Chest pain: -Patient reports no recurrence since her previous visit -She was advised to continue as needed Nitrostat and report to the ED if chest pain is not relieved with medication.  3.  Hyperlipidemia: -Patient's LDL cholesterol was 50 -Continue Zocor 20 mg daily  4.  Essential hypertension:  -Patient's blood pressure today was initially elevated at 162/78 and was 142/76 on recheck. -We will  have her check BPs at home for 1 week and return readings to our office.  If blood pressures remain elevated we will make adjustments to her current medication regimen.   -Continue carvedilol 6.25 mg twice daily, chlorthalidone 25 mg, Avapro 300 mg daily  5.  Subclavian stenosis: -Aortic arch angiography revealed heavily calcified proximal left subclavian with no aortic valve stenosis present -Continue GDMT with ASA and statin  Disposition: Follow-up with [name],  MD or APP in 12 months    Medication Adjustments/Labs and Tests Ordered: Current medicines are reviewed at length with the patient today.  Concerns regarding medicines are outlined above.   Signed, Napoleon Form, Leodis Rains, NP 12/01/2022, 7:27 PM [facility]

## 2022-12-03 ENCOUNTER — Other Ambulatory Visit: Payer: Self-pay | Admitting: Orthopedic Surgery

## 2022-12-03 ENCOUNTER — Encounter: Payer: Self-pay | Admitting: Nurse Practitioner

## 2022-12-03 ENCOUNTER — Ambulatory Visit: Payer: Medicare Other | Attending: Nurse Practitioner | Admitting: Nurse Practitioner

## 2022-12-03 VITALS — BP 142/76 | HR 82 | Ht 61.0 in | Wt 158.0 lb

## 2022-12-03 DIAGNOSIS — E785 Hyperlipidemia, unspecified: Secondary | ICD-10-CM

## 2022-12-03 DIAGNOSIS — I2510 Atherosclerotic heart disease of native coronary artery without angina pectoris: Secondary | ICD-10-CM

## 2022-12-03 DIAGNOSIS — R079 Chest pain, unspecified: Secondary | ICD-10-CM

## 2022-12-03 DIAGNOSIS — G4709 Other insomnia: Secondary | ICD-10-CM

## 2022-12-03 DIAGNOSIS — F419 Anxiety disorder, unspecified: Secondary | ICD-10-CM

## 2022-12-03 DIAGNOSIS — I10 Essential (primary) hypertension: Secondary | ICD-10-CM

## 2022-12-03 DIAGNOSIS — I771 Stricture of artery: Secondary | ICD-10-CM

## 2022-12-03 NOTE — Patient Instructions (Signed)
Medication Instructions:  Your physician recommends that you continue on your current medications as directed. Please refer to the Current Medication list given to you today. *If you need a refill on your cardiac medications before your next appointment, please call your pharmacy*   Lab Work: None ordered If you have labs (blood work) drawn today and your tests are completely normal, you will receive your results only by: MyChart Message (if you have MyChart) OR A paper copy in the mail If you have any lab test that is abnormal or we need to change your treatment, we will call you to review the results.   Testing/Procedures: None ordered   Follow-Up: At [facility], you and your health needs are our priority.  As part of our continuing mission to provide you with exceptional heart care, we have created designated Provider Care Teams.  These Care Teams include your primary Cardiologist (physician) and Advanced Practice Providers (APPs -  Physician Assistants and Nurse Practitioners) who all work together to provide you with the care you need, when you need it.  We recommend signing up for the patient portal called "MyChart".  Sign up information is provided on this After Visit Summary.  MyChart is used to connect with patients for Virtual Visits (Telemedicine).  Patients are able to view lab/test results, encounter notes, upcoming appointments, etc.  Non-urgent messages can be sent to your provider as well.   To learn more about what you can do with MyChart, go to [email or web address].    Your next appointment:   12 month(s)  Provider:   [name], MD  or Robin Searing, NP   Other Instructions Check your blood pressure daily for 1 week, then contact the office with your readings.  Check your blood pressure twice a day.

## 2022-12-03 NOTE — Telephone Encounter (Signed)
Patient is requesting a refill of the following medications: Requested Prescriptions   Pending Prescriptions Disp Refills   LORazepam (ATIVAN) 0.5 MG tablet [Pharmacy Med Name: LORazepam 0.5 MG TABLET] 30 tablet     Sig: TAKE 1 TABLET BY MOUTH EVERY NIGHT AT BEDTIME    Date of last refill: 09/27/22  Refill amount: 30  Treatment agreement date: 10/21/2022

## 2022-12-13 ENCOUNTER — Telehealth: Payer: Self-pay | Admitting: Internal Medicine

## 2022-12-13 NOTE — Telephone Encounter (Signed)
Patient was told to call in to give her BP/HR readings:  06/02- A.M.  146/74  HR 83   P.M. 146/86 HR 80 06/03- A.M   142/73  HR 88   P.M. 155/70 HR 84 06/04- A.M.  140/67  HR 92   P.M. 154/127 HR 89 06/05- A.M.  137/72  HR 85   P.M. 150/74 HR 82 06/06- A.M.  151/76 HR 86    P.M. 152/76 HR 84 06/07- A.M.  138/69 HR 90    P.M. 161/60 HR 86 06/08- A.M.  147/71 HR 83    P.M. 150/70 HR 86

## 2022-12-14 NOTE — Telephone Encounter (Signed)
Patient is returning call.  °

## 2022-12-14 NOTE — Telephone Encounter (Signed)
Left message to call back  

## 2022-12-14 NOTE — Telephone Encounter (Signed)
Patient is aware of provider recommendations and verbalized understanding. She repeated instructions back to me.

## 2022-12-17 ENCOUNTER — Ambulatory Visit (INDEPENDENT_AMBULATORY_CARE_PROVIDER_SITE_OTHER): Payer: Medicare Other | Admitting: Adult Health

## 2022-12-17 ENCOUNTER — Encounter: Payer: Self-pay | Admitting: Adult Health

## 2022-12-17 VITALS — BP 128/78 | HR 99 | Temp 97.6°F | Resp 18 | Ht 61.0 in | Wt 159.6 lb

## 2022-12-17 DIAGNOSIS — E1159 Type 2 diabetes mellitus with other circulatory complications: Secondary | ICD-10-CM

## 2022-12-17 DIAGNOSIS — M0609 Rheumatoid arthritis without rheumatoid factor, multiple sites: Secondary | ICD-10-CM

## 2022-12-17 DIAGNOSIS — E1169 Type 2 diabetes mellitus with other specified complication: Secondary | ICD-10-CM | POA: Diagnosis not present

## 2022-12-17 DIAGNOSIS — I152 Hypertension secondary to endocrine disorders: Secondary | ICD-10-CM

## 2022-12-17 DIAGNOSIS — E118 Type 2 diabetes mellitus with unspecified complications: Secondary | ICD-10-CM | POA: Diagnosis not present

## 2022-12-17 DIAGNOSIS — R350 Frequency of micturition: Secondary | ICD-10-CM | POA: Diagnosis not present

## 2022-12-17 DIAGNOSIS — N390 Urinary tract infection, site not specified: Secondary | ICD-10-CM | POA: Diagnosis not present

## 2022-12-17 DIAGNOSIS — E785 Hyperlipidemia, unspecified: Secondary | ICD-10-CM | POA: Diagnosis not present

## 2022-12-17 DIAGNOSIS — Z794 Long term (current) use of insulin: Secondary | ICD-10-CM | POA: Diagnosis not present

## 2022-12-17 LAB — POCT URINALYSIS DIPSTICK
Bilirubin, UA: NEGATIVE
Glucose, UA: NEGATIVE
Ketones, UA: NEGATIVE
Nitrite, UA: POSITIVE — AB
Protein, UA: POSITIVE — AB
Spec Grav, UA: 1.015 (ref 1.010–1.025)
Urobilinogen, UA: 0.2 E.U./dL
pH, UA: 6 (ref 5.0–8.0)

## 2022-12-17 MED ORDER — CIPROFLOXACIN HCL 500 MG PO TABS
500.0000 mg | ORAL_TABLET | Freq: Two times a day (BID) | ORAL | 0 refills | Status: AC
Start: 2022-12-17 — End: 2022-12-24

## 2022-12-17 NOTE — Progress Notes (Unsigned)
[facility] clinic  Provider: Kenard Gower DNP  Code Status:  Full Code  Goals of Care:     12/17/2022    9:34 AM  Advanced Directives  Does Patient Have a Medical Advance Directive? Yes  Type of Advance Directive Healthcare Power of Attorney  Copy of Healthcare Power of Attorney in Chart? No - copy requested     Chief Complaint  Patient presents with   Acute Visit    Possible UTI     HPI: Patient is a 82 y.o. female seen today for an acute visit for possible UTI. She complained of pain of increase urinary frequency mostly at night, started 2 days ago. She denies hematuria, chills and fever.   Type 2 diabetes mellitus with complication, with long-term current use of insulin (HCC) -  A1C 7.8, 09/10/22, has a continuous glucose monitor on her left upper arm, takes Semaglutide and [place]  Rheumatoid arthritis of multiple sites with negative rheumatoid factor (HCC)  -    Hypertension associated with diabetes (HCC) - Plan: carvedilol (COREG) 6.25 MG tablet   Past Medical History:  Diagnosis Date   Acute cystitis    UTI   Anxiety    Arthritis    Benign positional vertigo    Dermatitis    Diabetes mellitus without complication (HCC)    Family history of adverse reaction to anesthesia    GERD (gastroesophageal reflux disease)    Goiter, nontoxic, multinodular    Gout    H/O: hysterectomy    History of tobacco use    Hx laparoscopic cholecystectomy    Hypercholesterolemia    Hypertension    Incomplete bladder emptying    Insomnia    Nausea & vomiting 09/08/2017   Postmenopausal atrophic vaginitis    RA (rheumatoid arthritis) (HCC)    Rectocele    Renal calculus    Skin cancer    Sleep apnea    Thyroid condition    Type 2 diabetes mellitus (HCC)    Urolithiasis    Vaginal enterocele     Past Surgical History:  Procedure Laterality Date   AORTIC ARCH ANGIOGRAPHY N/A 10/22/2022   Procedure: AORTIC ARCH ANGIOGRAPHY;  Surgeon: Corky Crafts, MD;   Location: [facility] INVASIVE [facility];  Service: Cardiovascular;  Laterality: N/A;   CARPAL TUNNEL RELEASE  2018   CATARACT EXTRACTION, BILATERAL     CHOLECYSTECTOMY     COLONOSCOPY     07/11/2009, 02/2015   GALLBLADDER SURGERY     KNEE ARTHROSCOPY Right 05/20/2020   Procedure: RIGHT KNEE ARTHROSCOPY AND DEBRIDEMENT;  Surgeon: Nadara Mustard, MD;  Location: [facility];  Service: Orthopedics;  Laterality: Right;   PARTIAL HYSTERECTOMY     [place] left intact   RIGHT/LEFT HEART CATH AND CORONARY ANGIOGRAPHY N/A 10/22/2022   Procedure: RIGHT/LEFT HEART CATH AND CORONARY ANGIOGRAPHY;  Surgeon: Corky Crafts, MD;  Location: [facility] INVASIVE CV LAB;  Service: Cardiovascular;  Laterality: N/A;   SQUAMOUS CELL CARCINOMA EXCISION  05/2018    Allergies  Allergen Reactions   Latex Rash   Amaryl [Glimepiride]     Don't Remember   Amlodipine     LE edema on 7.5mg  daily. Tolerates 5mg  daily just fine   Codeine     SICK   Jardiance [Empagliflozin]     Urinary tract infection- SIDE EFFECT   Pneumovax 23 [Pneumococcal Vac Polyvalent]     Red / Swelling / Hot to touch SIDE EFFECTS   Wellbutrin [Bupropion]  Unknown    Outpatient Encounter Medications as of 12/17/2022  Medication Sig   carvedilol (COREG) 6.25 MG tablet Take 2 tablets by mouth 2 (two) times daily with a meal.   Accu-Chek Softclix Lancets lancets Use to test blood sugar three times daily. Dx: E11.8   aspirin EC 81 MG tablet Take 81 mg by mouth daily.   Biotin 1 MG CAPS Take 1 mg by mouth.   chlorthalidone (HYGROTON) 25 MG tablet Take 1 tablet (25 mg total) by mouth daily.   Cholecalciferol (VITAMIN D3) 50 MCG (2000 UT) capsule Take 2,000 Units by mouth daily.   clobetasol cream (TEMOVATE) 0.05 % Apply 1 Application topically as needed.   Continuous Blood Gluc Receiver (FREESTYLE LIBRE 2 READER) DEVI    Continuous Glucose Sensor (FREESTYLE LIBRE 2 SENSOR) MISC USE TO CHECK BLOOD SUGAR - CHECK EVERY 14 DAYS   CRANBERRY  EXTRACT PO Take 650 mg by mouth daily.   Diclofenac Sodium (VOLTAREN EX) Apply 1 application  topically 3 (three) times daily as needed (Arthritis).   estradiol (ESTRACE) 0.1 MG/GM vaginal cream Place 1 Applicatorful vaginally 3 (three) times a week.   etanercept (ENBREL SURECLICK) 50 MG/ML injection Inject 50 mg into the skin once a week.   fluocinonide (LIDEX) 0.05 % external solution Apply 1 Application topically daily as needed.   insulin degludec (TRESIBA FLEXTOUCH) 200 UNIT/ML FlexTouch Pen Inject 60 Units into the skin daily in the afternoon.   Insulin Pen Needle 31G X 5 MM MISC 1 Device by Does not apply route daily in the afternoon.   irbesartan (AVAPRO) 300 MG tablet Take 1 tablet (300 mg total) by mouth daily. Please call [phone] to schedule an appointment for July for future refills thank you.   loperamide (IMODIUM A-D) 2 MG tablet Take 2 mg by mouth daily as needed for diarrhea or loose stools.   LORazepam (ATIVAN) 0.5 MG tablet TAKE 1 TABLET BY MOUTH EVERY NIGHT AT BEDTIME   MAGNESIUM CITRATE PO Take 250 mg by mouth daily as needed (leg cramps).   MELATONIN PO Take 10 mg by mouth at bedtime as needed (Sleep).   methocarbamol (ROBAXIN) 500 MG tablet Take 1 tablet (500 mg total) by mouth daily as needed for muscle spasms.   Multiple Vitamins-Minerals (HAIR SKIN & NAILS PO) Take 1 tablet by mouth 2 (two) times daily.   nitroGLYCERIN (NITROSTAT) 0.4 MG SL tablet Place 1 tablet (0.4 mg total) under the tongue every 5 (five) minutes as needed for chest pain.   Omeprazole 20 MG TBDD Take 20 mg by mouth daily.   ondansetron (ZOFRAN) 4 MG tablet Take 1 tablet (4 mg total) by mouth 3 (three) times daily as needed for nausea or vomiting.   Probiotic Product (PROBIOTIC PO) Take 1 tablet by mouth daily.   Propylene Glycol (SYSTANE COMPLETE OP) Place 1-2 drops into both eyes daily as needed (Dry eye).   Semaglutide,0.25 or 0.5MG /DOS, (OZEMPIC, 0.25 OR 0.5 MG/DOSE,) 2 MG/1.5ML SOPN Inject 0.5  mg into the skin once a week.   simvastatin (ZOCOR) 20 MG tablet TAKE ONE TABLET BY MOUTH EVERY EVENING   [DISCONTINUED] carvedilol (COREG) 6.25 MG tablet TAKE ONE TABLET BY MOUTH TWICE A DAY   No facility-administered encounter medications on file as of 12/17/2022.    Review of Systems:  Review of Systems  Constitutional:  Negative for activity change, chills and fever.  Gastrointestinal:  Negative for abdominal distention, abdominal pain, diarrhea, nausea and vomiting.  Genitourinary:  Positive for dysuria,  frequency and urgency. Negative for difficulty urinating and flank pain.       Has suprapubic pain    Health Maintenance  Topic Date Due   INFLUENZA VACCINE  02/03/2023   HEMOGLOBIN A1C  03/13/2023   Diabetic kidney evaluation - Urine ACR  04/03/2023   DTaP/Tdap/Td (2 - Td or Tdap) 05/02/2023   Medicare Annual Wellness (AWV)  05/12/2023   OPHTHALMOLOGY EXAM  07/09/2023   FOOT EXAM  09/10/2023   Diabetic kidney evaluation - eGFR measurement  10/14/2023   Pneumonia Vaccine 3+ Years old  Completed   DEXA SCAN  Completed   Hepatitis C Screening  Completed   HPV VACCINES  Aged Out   COVID-19 Vaccine  Discontinued   Zoster Vaccines- Shingrix  Discontinued    Physical Exam: Vitals:   12/17/22 1013  BP: 128/78  Pulse: 99  Resp: 18  Temp: 97.6 F (36.4 C)  SpO2: 97%  Weight: 159 lb 9.6 oz (72.4 kg)  Height: 5\' 1"  (1.549 m)   Body mass index is 30.16 kg/m. Physical Exam Constitutional:      General: She is not in acute distress.    Appearance: She is obese.  HENT:     Head: Normocephalic and atraumatic.     Nose: Nose normal.     Mouth/Throat:     Mouth: Mucous membranes are moist.  Eyes:     Conjunctiva/sclera: Conjunctivae normal.  Cardiovascular:     Rate and Rhythm: Normal rate and regular rhythm.  Pulmonary:     Effort: Pulmonary effort is normal.     Breath sounds: Normal breath sounds.  Abdominal:     General: Bowel sounds are normal.     Palpations:  Abdomen is soft.  Musculoskeletal:        General: Normal range of motion.     Cervical back: Normal range of motion.  Skin:    General: Skin is warm and dry.  Neurological:     General: No focal deficit present.     Mental Status: She is alert and oriented to person, place, and time.  Psychiatric:        Mood and Affect: Mood normal.        Behavior: Behavior normal.        Thought Content: Thought content normal.        Judgment: Judgment normal.     Labs reviewed: Basic Metabolic Panel: Recent Labs    04/01/22 0931 06/24/22 1133 10/14/22 1238 10/22/22 1111 10/22/22 1120 10/22/22 1121  NA 140 138 140 140 140 140  K 4.5 4.8 4.3 4.0 3.9 3.9  CL 103 101 99  --   --   --   CO2 29 28 27   --   --   --   GLUCOSE 71 198* 156*  --   --   --   BUN 25 21 30*  --   --   --   CREATININE 1.06* 1.26* 1.17*  --   --   --   CALCIUM 9.4 9.3 9.5  --   --   --    Liver Function Tests: Recent Labs    12/30/21 1109 03/29/22 1455 06/24/22 1133  AST 12 11 10   ALT 11 10 9   BILITOT 1.4* 1.2 1.0  PROT 6.7 6.7 6.5   No results for input(s): "LIPASE", "AMYLASE" in the last 8760 hours. No results for input(s): "AMMONIA" in the last 8760 hours. CBC: Recent Labs    03/29/22 1455 04/01/22 0931  06/24/22 1133 10/14/22 1238 10/22/22 1111 10/22/22 1120 10/22/22 1121  WBC 11.4* 9.1 10.3 11.5*  --   --   --   NEUTROABS 1,610 5,997 7,086  --   --   --   --   HGB 12.9 13.0 12.8 13.3 11.2* 11.6* 11.6*  HCT 37.1 37.8 37.3 39.2 33.0* 34.0* 34.0*  MCV 92.8 91.5 93.3 94  --   --   --   PLT 302 289 308 330  --   --   --    Lipid Panel: No results for input(s): "CHOL", "HDL", "LDLCALC", "TRIG", "CHOLHDL", "LDLDIRECT" in the last 8760 hours. Lab Results  Component Value Date   HGBA1C 7.8 (A) 09/10/2022    Procedures since last visit: No results found.  Assessment/Plan  1. Acute lower UTI Urinary frequency - ciprofloxacin (CIPRO) 500 MG tablet; Take 1 tablet (500 mg total) by mouth 2  (two) times daily for 7 days.  Dispense: 14 tablet; Refill: 0 - POC Urinalysis Dipstick -  nitrites positive, protein positive, leukocyte moderate - Culture, Urine  2. Hyperlipidemia associated with type 2 diabetes mellitus (HCC) -  continue Simvastatin - Lipid Panel; Future  3. Type 2 diabetes mellitus with complication, with long-term current use of insulin (HCC) Lab Results  Component Value Date   HGBA1C 7.8 (A) 09/10/2022   -  continue Semaglutide and [place]   4. Rheumatoid arthritis of multiple sites with negative rheumatoid factor (HCC) -  stable -  continue Enbrel  5. Hypertension associated with diabetes (HCC) -  BP 128/78 - carvedilol (COREG) 6.25 MG tablet; Take 2 tablets by mouth 2 (two) times daily with a meal.    Labs/tests ordered:  urine dipstick and urine culture  Next appt:  04/14/2023

## 2022-12-19 LAB — URINE CULTURE
MICRO NUMBER:: 15085128
SPECIMEN QUALITY:: ADEQUATE

## 2022-12-21 ENCOUNTER — Other Ambulatory Visit: Payer: Medicare Other

## 2022-12-21 DIAGNOSIS — E1169 Type 2 diabetes mellitus with other specified complication: Secondary | ICD-10-CM

## 2022-12-21 DIAGNOSIS — E785 Hyperlipidemia, unspecified: Secondary | ICD-10-CM | POA: Diagnosis not present

## 2022-12-21 LAB — LIPID PANEL
Cholesterol: 130 mg/dL (ref <200)
HDL: 56 mg/dL (ref >=50)
LDL Cholesterol (Calc): 51 mg/dL (calc)
Non-HDL Cholesterol (Calc): 74 mg/dL (calc) (ref <130)
Total CHOL/HDL Ratio: 2.3 (calc) (ref <5.0)
Triglycerides: 151 mg/dL — ABNORMAL HIGH (ref <150)

## 2022-12-22 NOTE — Progress Notes (Signed)
Lipid panel normal

## 2022-12-22 NOTE — Progress Notes (Signed)
Urine bacteria isolate is sensitive to cipro which was prescribed during the clinic visit.

## 2022-12-23 ENCOUNTER — Other Ambulatory Visit: Payer: Self-pay | Admitting: Adult Health

## 2022-12-23 ENCOUNTER — Telehealth: Payer: Self-pay | Admitting: Internal Medicine

## 2022-12-23 DIAGNOSIS — B3731 Acute candidiasis of vulva and vagina: Secondary | ICD-10-CM

## 2022-12-23 MED ORDER — FLUCONAZOLE 150 MG PO TABS
150.0000 mg | ORAL_TABLET | Freq: Once | ORAL | 0 refills | Status: AC
Start: 2022-12-23 — End: 2022-12-23

## 2022-12-23 NOTE — Progress Notes (Signed)
Hold Zocor on day she takes Diflucan since they interact with each other. Diflucan prescription sent to pharmacy.

## 2022-12-23 NOTE — Telephone Encounter (Signed)
Pt c/o BP issue: STAT if pt c/o blurred vision, one-sided weakness or slurred speech  1. What are your last 5 BP readings?  6/12: 135/77 88 AM 6/13: 119/75 83 AM, 117/66 88 PM 6/14: 141/75 84 AM, 150/72 89 PM 6/15: 124/69 84 AM, 135/70 88 PM 6/16: 145/77 91 AM, 136/66 88 PM 6/17: 145/75 83 AM 6/18: 111/58 83 AM, 123/62 89 PM 6/20: 133/62 89 AM   2. Are you having any other symptoms (ex. Dizziness, headache, blurred vision, passed out)?  No  3. What is your BP issue?  Patient is calling to report BP readings.

## 2022-12-23 NOTE — Telephone Encounter (Signed)
Please let patient know that blood pressures are slightly elevated but stable.  Please advise to continue current medication regimen for now.  Please let me know if having additional questions.   Robin Searing, NP   Called patient with Ernest's recommendations as written above. Patient verbalized understanding and wanted Dr. Izora Ribas to be aware of everything, so will send him note as FYI.

## 2022-12-24 NOTE — Telephone Encounter (Signed)
Patient aware of Dr. Debby Bud feedback.

## 2022-12-27 DIAGNOSIS — M1711 Unilateral primary osteoarthritis, right knee: Secondary | ICD-10-CM | POA: Diagnosis not present

## 2022-12-31 DIAGNOSIS — L900 Lichen sclerosus et atrophicus: Secondary | ICD-10-CM | POA: Diagnosis not present

## 2023-01-01 ENCOUNTER — Other Ambulatory Visit: Payer: Self-pay | Admitting: Internal Medicine

## 2023-01-03 ENCOUNTER — Other Ambulatory Visit: Payer: Self-pay | Admitting: *Deleted

## 2023-01-03 DIAGNOSIS — M0609 Rheumatoid arthritis without rheumatoid factor, multiple sites: Secondary | ICD-10-CM

## 2023-01-03 DIAGNOSIS — Z79899 Other long term (current) drug therapy: Secondary | ICD-10-CM

## 2023-01-03 MED ORDER — ENBREL SURECLICK 50 MG/ML ~~LOC~~ SOAJ: 50.0000 mg | SUBCUTANEOUS | 0 refills | Status: DC

## 2023-01-03 NOTE — Telephone Encounter (Signed)
Patient called requesting a refill on Enbrel. Patient advised she is due for labs this month. Patient will come to update them this week.   Last Fill: 09/09/2022  Labs: 10/14/2022 WBC 11.5, Glucose 156, BUN 30, Creat. 1.17, GFR 47  TB Gold: 06/24/2022 Neg    Next Visit: 05/17/2023  Last Visit: 11/15/2022  [id number] arthritis of multiple sites with negative rheumatoid factor   Current Dose per office note 11/15/2022: Enbrel 50 mg sq injections once weekly.   Okay to refill Enbrel?

## 2023-01-04 ENCOUNTER — Telehealth: Payer: Self-pay

## 2023-01-04 NOTE — Telephone Encounter (Signed)
Spoke to Annette Suarez and advised her that her Ozempic # 5 boxes has arrived from patient assistance

## 2023-01-05 ENCOUNTER — Other Ambulatory Visit: Payer: Self-pay

## 2023-01-05 DIAGNOSIS — Z79899 Other long term (current) drug therapy: Secondary | ICD-10-CM | POA: Diagnosis not present

## 2023-01-05 LAB — CBC WITH DIFFERENTIAL/PLATELET
HCT: 33.6 % — ABNORMAL LOW (ref 35.0–45.0)
Neutro Abs: 6030 cells/uL (ref 1500–7800)
RDW: 11.9 % (ref 11.0–15.0)
Total Lymphocyte: 23.3 %

## 2023-01-05 NOTE — Telephone Encounter (Signed)
Patient came in to office and picked up 5 boxes of patient  assistance Ozempic.

## 2023-01-06 LAB — CBC WITH DIFFERENTIAL/PLATELET
Absolute Monocytes: 585 cells/uL (ref 200–950)
Basophils Absolute: 72 cells/uL (ref 0–200)
Basophils Relative: 0.8 %
Eosinophils Absolute: 216 cells/uL (ref 15–500)
Eosinophils Relative: 2.4 %
Hemoglobin: 11.6 g/dL — ABNORMAL LOW (ref 11.7–15.5)
Lymphs Abs: 2097 cells/uL (ref 850–3900)
MCH: 32.1 pg (ref 27.0–33.0)
MCHC: 34.5 g/dL (ref 32.0–36.0)
MCV: 93.1 fL (ref 80.0–100.0)
MPV: 11.4 fL (ref 7.5–12.5)
Monocytes Relative: 6.5 %
Neutrophils Relative %: 67 %
Platelets: 281 10*3/uL (ref 140–400)
RBC: 3.61 10*6/uL — ABNORMAL LOW (ref 3.80–5.10)
WBC: 9 10*3/uL (ref 3.8–10.8)

## 2023-01-06 LAB — COMPLETE METABOLIC PANEL WITH GFR
AG Ratio: 1.9 (calc) (ref 1.0–2.5)
ALT: 12 U/L (ref 6–29)
AST: 11 U/L (ref 10–35)
Albumin: 4.1 g/dL (ref 3.6–5.1)
Alkaline phosphatase (APISO): 88 U/L (ref 37–153)
BUN/Creatinine Ratio: 22 (calc) (ref 6–22)
BUN: 23 mg/dL (ref 7–25)
CO2: 30 mmol/L (ref 20–32)
Calcium: 9.1 mg/dL (ref 8.6–10.4)
Chloride: 103 mmol/L (ref 98–110)
Creat: 1.03 mg/dL — ABNORMAL HIGH (ref 0.60–1.00)
Globulin: 2.2 g/dL (calc) (ref 1.9–3.7)
Glucose, Bld: 132 mg/dL — ABNORMAL HIGH (ref 65–99)
Potassium: 4.8 mmol/L (ref 3.5–5.3)
Sodium: 140 mmol/L (ref 135–146)
Total Bilirubin: 1.2 mg/dL (ref 0.2–1.2)
Total Protein: 6.3 g/dL (ref 6.1–8.1)
eGFR: 55 mL/min/{1.73_m2} — ABNORMAL LOW (ref >=60)

## 2023-01-07 NOTE — Progress Notes (Signed)
Glucose is mildly elevated at 132, probably not a fasting sample.  Creatinine is elevated at 1.03, better than before.  Hemoglobin is low and stable.

## 2023-01-09 ENCOUNTER — Other Ambulatory Visit: Payer: Self-pay | Admitting: Nurse Practitioner

## 2023-02-10 DIAGNOSIS — R238 Other skin changes: Secondary | ICD-10-CM | POA: Diagnosis not present

## 2023-02-10 DIAGNOSIS — L821 Other seborrheic keratosis: Secondary | ICD-10-CM | POA: Diagnosis not present

## 2023-02-10 DIAGNOSIS — L814 Other melanin hyperpigmentation: Secondary | ICD-10-CM | POA: Diagnosis not present

## 2023-02-10 DIAGNOSIS — Z08 Encounter for follow-up examination after completed treatment for malignant neoplasm: Secondary | ICD-10-CM | POA: Diagnosis not present

## 2023-02-10 DIAGNOSIS — L218 Other seborrheic dermatitis: Secondary | ICD-10-CM | POA: Diagnosis not present

## 2023-02-10 DIAGNOSIS — Z86007 Personal history of in-situ neoplasm of skin: Secondary | ICD-10-CM | POA: Diagnosis not present

## 2023-02-10 DIAGNOSIS — B078 Other viral warts: Secondary | ICD-10-CM | POA: Diagnosis not present

## 2023-02-10 DIAGNOSIS — D225 Melanocytic nevi of trunk: Secondary | ICD-10-CM | POA: Diagnosis not present

## 2023-02-10 DIAGNOSIS — Z85828 Personal history of other malignant neoplasm of skin: Secondary | ICD-10-CM | POA: Diagnosis not present

## 2023-02-11 ENCOUNTER — Encounter: Payer: Self-pay | Admitting: Family Medicine

## 2023-02-14 ENCOUNTER — Encounter: Payer: Self-pay | Admitting: Family Medicine

## 2023-02-18 ENCOUNTER — Encounter: Payer: Self-pay | Admitting: Nurse Practitioner

## 2023-02-18 ENCOUNTER — Ambulatory Visit (INDEPENDENT_AMBULATORY_CARE_PROVIDER_SITE_OTHER): Payer: Medicare Other | Admitting: Nurse Practitioner

## 2023-02-18 DIAGNOSIS — M545 Low back pain, unspecified: Secondary | ICD-10-CM | POA: Diagnosis not present

## 2023-02-18 DIAGNOSIS — R350 Frequency of micturition: Secondary | ICD-10-CM | POA: Diagnosis not present

## 2023-02-18 DIAGNOSIS — R300 Dysuria: Secondary | ICD-10-CM | POA: Diagnosis not present

## 2023-02-18 DIAGNOSIS — R8290 Unspecified abnormal findings in urine: Secondary | ICD-10-CM | POA: Diagnosis not present

## 2023-02-18 LAB — POCT URINALYSIS DIPSTICK
Bilirubin, UA: NEGATIVE
Glucose, UA: POSITIVE — AB
Ketones, UA: POSITIVE
Nitrite, UA: POSITIVE
Protein, UA: POSITIVE — AB
Spec Grav, UA: 1.015 (ref 1.010–1.025)
Urobilinogen, UA: NEGATIVE E.U./dL — AB
pH, UA: 5 (ref 5.0–8.0)

## 2023-02-18 NOTE — Progress Notes (Signed)
Careteam: Patient Care Team: Octavia Heir, NP as PCP - General (Adult Health Nurse Practitioner) [name], MD as PCP - Cardiology (Cardiology) [name], OD (Optometry)  PLACE OF SERVICE:  [facility]  Advanced Directive information Does Patient Have a Medical Advance Directive?: No, Would patient like information on creating a medical advance directive?: No - Patient declined  Allergies  Allergen Reactions   Latex Rash   Amaryl [Glimepiride]     Don't Remember   Amlodipine     LE edema on 7.5mg  daily. Tolerates 5mg  daily just fine   Codeine     SICK   Jardiance [Empagliflozin]     Urinary tract infection- SIDE EFFECT   Pneumovax 23 [Pneumococcal Vac Polyvalent]     Red / Swelling / Hot to touch SIDE EFFECTS   Wellbutrin [Bupropion]     Unknown    Chief Complaint  Patient presents with   Acute Visit    Patient complains of possible UTI.     HPI: Patient is a 82 y.o. female for urinary frequency at night. Symptoms are just at night typically. Also will have during the day occasionally but mostly at night.  No fever.  No urgency.  She is a diabetic and blood sugars have been high. She has appt with endocrinologist next month. Also has a urologist. Reports she tired to help her but she is still having UTI.  Taking diabetic medication as prescribed.  Reports she does not eat a high sugar diet.  Reports she will start feeling bad all over when she has a UTI  Reports she had UTI last month, recurrent UTI. Has had urologist in the past but did not help. She has not seen recently.    Review of Systems:  Review of Systems  Constitutional:  Negative for chills, fever, malaise/fatigue and weight loss.  Genitourinary:  Positive for dysuria and frequency. Negative for flank pain, hematuria and urgency.    Past Medical History:  Diagnosis Date   Acute cystitis    UTI   Anxiety    Arthritis    Benign positional vertigo    Dermatitis    Diabetes  mellitus without complication (HCC)    Family history of adverse reaction to anesthesia    GERD (gastroesophageal reflux disease)    Goiter, nontoxic, multinodular    Gout    H/O: hysterectomy    History of tobacco use    Hx laparoscopic cholecystectomy    Hypercholesterolemia    Hypertension    Incomplete bladder emptying    Insomnia    Nausea & vomiting 09/08/2017   Postmenopausal atrophic vaginitis    RA (rheumatoid arthritis) (HCC)    Rectocele    Renal calculus    Skin cancer    Sleep apnea    Thyroid condition    Type 2 diabetes mellitus (HCC)    Urolithiasis    Vaginal enterocele    Past Surgical History:  Procedure Laterality Date   AORTIC ARCH ANGIOGRAPHY N/A 10/22/2022   Procedure: AORTIC ARCH ANGIOGRAPHY;  Surgeon: Corky Crafts, MD;  [facility]: [facility];  Service: Cardiovascular;  Laterality: N/A;   CARPAL TUNNEL RELEASE  2018   CATARACT EXTRACTION, BILATERAL     CHOLECYSTECTOMY     COLONOSCOPY     07/11/2009, 02/2015   GALLBLADDER SURGERY     KNEE ARTHROSCOPY Right 05/20/2020   Procedure: RIGHT KNEE ARTHROSCOPY AND DEBRIDEMENT;  Surgeon: Nadara Mustard, MD;  Location: [facility];  Service: Orthopedics;  Laterality: Right;   PARTIAL HYSTERECTOMY     Overies left intact   RIGHT/LEFT HEART CATH AND CORONARY ANGIOGRAPHY N/A 10/22/2022   Procedure: RIGHT/LEFT HEART CATH AND CORONARY ANGIOGRAPHY;  Surgeon: Corky Crafts, MD;  Location: [facility];  Service: Cardiovascular;  Laterality: N/A;   SQUAMOUS CELL CARCINOMA EXCISION  05/2018   Social History:   reports that she quit smoking about 44 years ago. Her smoking use included cigarettes. She started smoking about 54 years ago. She has a 2 pack-year smoking history. She has been exposed to tobacco smoke. She has never used smokeless tobacco. She reports that she does not drink alcohol and does not use drugs.  Family History  Problem Relation Age of Onset   Diabetes Mother     Heart disease Father 17   Diabetes Son    Crohn's disease Son     Medications: Patient's Medications  New Prescriptions   No medications on file  Previous Medications   ACCU-CHEK SOFTCLIX LANCETS LANCETS    Use to test blood sugar three times daily. Dx: E11.8   ASPIRIN EC 81 MG TABLET    Take 81 mg by mouth daily.   BIOTIN 1 MG CAPS    Take 1 mg by mouth.   CARVEDILOL (COREG) 6.25 MG TABLET    Take 2 tablets by mouth 2 (two) times daily with a meal.   CHLORTHALIDONE (HYGROTON) 25 MG TABLET    Take 1 tablet (25 mg total) by mouth daily.   CHOLECALCIFEROL (VITAMIN D3) 50 MCG (2000 UT) CAPSULE    Take 2,000 Units by mouth daily.   CLOBETASOL CREAM (TEMOVATE) 0.05 %    Apply 1 Application topically as needed.   CONTINUOUS BLOOD GLUC RECEIVER (FREESTYLE LIBRE 2 READER) DEVI       CONTINUOUS GLUCOSE SENSOR (FREESTYLE LIBRE 2 SENSOR) MISC    USE TO CHECK BLOOD SUGAR - CHECK EVERY 14 DAYS   CRANBERRY EXTRACT PO    Take 650 mg by mouth daily.   DICLOFENAC SODIUM (VOLTAREN EX)    Apply 1 application  topically 3 (three) times daily as needed (Arthritis).   ESTRADIOL (ESTRACE) 0.1 MG/GM VAGINAL CREAM    Place 1 Applicatorful vaginally 3 (three) times a week.   ETANERCEPT (ENBREL SURECLICK) 50 MG/ML INJECTION    Inject 50 mg into the skin once a week.   FLUOCINONIDE (LIDEX) 0.05 % EXTERNAL SOLUTION    Apply 1 Application topically daily as needed.   INSULIN DEGLUDEC (TRESIBA FLEXTOUCH) 200 UNIT/ML FLEXTOUCH PEN    Inject 60 Units into the skin daily in the afternoon.   INSULIN PEN NEEDLE 31G X 5 MM MISC    1 Device by Does not apply route daily in the afternoon.   IRBESARTAN (AVAPRO) 300 MG TABLET    Take 1 tablet (300 mg total) by mouth daily.   LOPERAMIDE (IMODIUM A-D) 2 MG TABLET    Take 2 mg by mouth daily as needed for diarrhea or loose stools.   LORAZEPAM (ATIVAN) 0.5 MG TABLET    TAKE 1 TABLET BY MOUTH EVERY NIGHT AT BEDTIME   MAGNESIUM CITRATE PO    Take 250 mg by mouth daily as needed  (leg cramps).   MELATONIN PO    Take 10 mg by mouth at bedtime as needed (Sleep).   METHOCARBAMOL (ROBAXIN) 500 MG TABLET    Take 1 tablet (500 mg total) by mouth daily as needed for muscle spasms.  MULTIPLE VITAMINS-MINERALS (HAIR SKIN & NAILS PO)    Take 1 tablet by mouth 2 (two) times daily.   NITROGLYCERIN (NITROSTAT) 0.4 MG SL TABLET    Place 1 tablet (0.4 mg total) under the tongue every 5 (five) minutes as needed for chest pain.   OMEPRAZOLE 20 MG TBDD    Take 20 mg by mouth daily.   ONDANSETRON (ZOFRAN) 4 MG TABLET    TAKE 1 TABLET BY MOUTH 3 TIMES A DAY AS NEEDED FOR NAUSEA AND/OR VOMITING   PROBIOTIC PRODUCT (PROBIOTIC PO)    Take 1 tablet by mouth daily.   PROPYLENE GLYCOL (SYSTANE COMPLETE OP)    Place 1-2 drops into both eyes daily as needed (Dry eye).   SEMAGLUTIDE,0.25 OR 0.5MG /DOS, (OZEMPIC, 0.25 OR 0.5 MG/DOSE,) 2 MG/1.5ML SOPN    Inject 0.5 mg into the skin once a week.   SIMVASTATIN (ZOCOR) 20 MG TABLET    TAKE ONE TABLET BY MOUTH EVERY EVENING  Modified Medications   No medications on file  Discontinued Medications   No medications on file    Physical Exam:  Vitals:   02/18/23 1447  BP: 136/72  Pulse: 88  Resp: 17  Temp: 97.9 F (36.6 C)  SpO2: 95%  Weight: 160 lb 6.4 oz (72.8 kg)  Height: 5\' 1"  (1.549 m)   Body mass index is 30.31 kg/m. Wt Readings from Last 3 Encounters:  02/18/23 160 lb 6.4 oz (72.8 kg)  12/17/22 159 lb 9.6 oz (72.4 kg)  12/03/22 158 lb (71.7 kg)    Physical Exam Constitutional:      Appearance: Normal appearance.  Pulmonary:     Effort: Pulmonary effort is normal.  Abdominal:     General: Bowel sounds are normal.     Palpations: Abdomen is soft.     Tenderness: There is no abdominal tenderness.  Neurological:     Mental Status: She is alert. Mental status is at baseline.  Psychiatric:        Mood and Affect: Mood normal.     Labs reviewed: Basic Metabolic Panel: Recent Labs    06/24/22 1133 10/14/22 1238  10/22/22 1111 10/22/22 1120 10/22/22 1121 01/05/23 1044  NA 138 140   < > 140 140 140  K 4.8 4.3   < > 3.9 3.9 4.8  CL 101 99  --   --   --  103  CO2 28 27  --   --   --  30  GLUCOSE 198* 156*  --   --   --  132*  BUN 21 30*  --   --   --  23  CREATININE 1.26* 1.17*  --   --   --  1.03*  CALCIUM 9.3 9.5  --   --   --  9.1   < > = values in this interval not displayed.   Liver Function Tests: Recent Labs    03/29/22 1455 06/24/22 1133 01/05/23 1044  AST 11 10 11   ALT 10 9 12   BILITOT 1.2 1.0 1.2  PROT 6.7 6.5 6.3   No results for input(s): "LIPASE", "AMYLASE" in the last 8760 hours. No results for input(s): "AMMONIA" in the last 8760 hours. CBC: Recent Labs    04/01/22 0931 06/24/22 1133 10/14/22 1238 10/22/22 1111 10/22/22 1120 10/22/22 1121 01/05/23 1044  WBC 9.1 10.3 11.5*  --   --   --  9.0  NEUTROABS 5,997 7,086  --   --   --   --  6,030  HGB 13.0 12.8 13.3   < > 11.6* 11.6* 11.6*  HCT 37.8 37.3 39.2   < > 34.0* 34.0* 33.6*  MCV 91.5 93.3 94  --   --   --  93.1  PLT 289 308 330  --   --   --  281   < > = values in this interval not displayed.   Lipid Panel: Recent Labs    12/21/22 0850  CHOL 130  HDL 56  LDLCALC 51  TRIG 151*  CHOLHDL 2.3   TSH: No results for input(s): "TSH" in the last 8760 hours. A1C: Lab Results  Component Value Date   HGBA1C 7.8 (A) 09/10/2022     Assessment/Plan 1. Dysuria Dysuria on occasion noted. Will send off for culture prior to starting antibiotic, encouraged to increase hydration with water.  - POCT urinalysis dipstick - Culture, Urine  2. Urinary frequency At night reporting this is baseline but having more at this time Will send urine for culture Encouraged follow up with urologist as well.  Will wait to prescribe antibiotics until culture results. She is in agreement  - POCT urinalysis dipstick - Culture, Urine   Darleth Eustache K. Biagio Borg [facility] [phone]

## 2023-02-18 NOTE — Patient Instructions (Signed)
Increase water during the day.

## 2023-02-20 LAB — URINE CULTURE
MICRO NUMBER:: 15342866
SPECIMEN QUALITY:: ADEQUATE

## 2023-02-21 ENCOUNTER — Other Ambulatory Visit: Payer: Self-pay

## 2023-02-21 ENCOUNTER — Other Ambulatory Visit: Payer: Self-pay | Admitting: Nurse Practitioner

## 2023-02-21 DIAGNOSIS — F419 Anxiety disorder, unspecified: Secondary | ICD-10-CM

## 2023-02-21 DIAGNOSIS — G4709 Other insomnia: Secondary | ICD-10-CM

## 2023-02-21 MED ORDER — LORAZEPAM 0.5 MG PO TABS
0.5000 mg | ORAL_TABLET | Freq: Every day | ORAL | 1 refills | Status: DC
Start: 2023-02-21 — End: 2023-05-17

## 2023-02-21 MED ORDER — NITROFURANTOIN MONOHYD MACRO 100 MG PO CAPS: 100.0000 mg | ORAL_CAPSULE | Freq: Two times a day (BID) | ORAL | 0 refills | Status: DC

## 2023-02-21 NOTE — Telephone Encounter (Signed)
Refill request received from [facility] for Lorazepam 0.5 mg tablet.  Medication last refilled 12/04/2022.  Medication pended and sent to Fletcher Anon, NP (covering provider)

## 2023-02-22 DIAGNOSIS — M1711 Unilateral primary osteoarthritis, right knee: Secondary | ICD-10-CM | POA: Diagnosis not present

## 2023-03-02 DIAGNOSIS — M1711 Unilateral primary osteoarthritis, right knee: Secondary | ICD-10-CM | POA: Diagnosis not present

## 2023-03-09 DIAGNOSIS — M1711 Unilateral primary osteoarthritis, right knee: Secondary | ICD-10-CM | POA: Diagnosis not present

## 2023-03-21 NOTE — Progress Notes (Unsigned)
Name: Annette Suarez  Age/ Sex: 82 y.o., female   MRN/ DOB: [id number], February 25, 1941     PCP: Octavia Heir, NP   Reason for Endocrinology Evaluation: Type 2 Diabetes Mellitus  Initial Endocrine Consultative Visit: 05/11/2021    PATIENT IDENTIFIER: Annette Suarez is a 82 y.o. female with a past medical history of T2DM, HTN, RA and dyslipidemia. The patient has followed with Endocrinology clinic since 05/11/2021 for consultative assistance with management of her diabetes.  DIABETIC HISTORY:  Ms. Pistole was diagnosed with DM in ~ 1995 . She is intolerant to Metformin- nausea and Jardiance due to UTI's .Her hemoglobin A1c has ranged from 7.5% in 2021, peaking at 8.6% in 2023.  Transitioned care from Dr. Everardo All 08/2021  Started trulicity in 05/2021  THYROID HISTORY : She has been diagnosed with MNG based on thyroid ultrasound in 2011 showing multiple thyroid nodules. She is s/p benign cytology of the left middle and left inferior nodules in 2011. Ultrasound in 2021 confirmed at least a 5 year stability    SUBJECTIVE:   During the last visit (09/10/2022): A1c 7.8 %     Today (03/22/2023): Annette Suarez is here to establish care for diabetes. She has been checking blood sugars multiple times daily through CGM. The patient has  had hypoglycemic episodes since the last clinic visit.   Denies vomiting with rare nausea  Denies constipation or diarrhea   Had recent UTI's   H/OME DIABETES REGIMEN:  Tresiba 64 units daily  Ozempic 0.5 mg weekly ( Weekly) - 0.25 mg weekly    Statin: yes ACE-I/ARB: yes     CONTINUOUS GLUCOSE MONITORING RECORD INTERPRETATION    Dates of Recording:9/3-9/16/2024  Sensor description:freestyle libre  Results statistics:   CGM use % of time 93  Average and SD 235/30.1  Time in range 24 %  % Time Above 180 33  % Time above 250 43  % Time Below target 0     Glycemic patterns summary: BG stay high throughout the night and trend down in the middle of the  day  Hyperglycemic episodes after supper  Hypoglycemic episodes occurred N/A  Overnight periods: High   DIABETIC COMPLICATIONS: Microvascular complications:  CKD III Denies: retinopathy, neuropathy  Last Eye Exam: Completed 07/2021  Macrovascular complications:   Denies: CAD, CVA, PVD   HISTORY:  Past Medical History:  Past Medical History:  Diagnosis Date   Acute cystitis    UTI   Anxiety    Arthritis    Benign positional vertigo    Dermatitis    Diabetes mellitus without complication (HCC)    Family history of adverse reaction to anesthesia    GERD (gastroesophageal reflux disease)    Goiter, nontoxic, multinodular    Gout    H/O: hysterectomy    History of tobacco use    Hx laparoscopic cholecystectomy    Hypercholesterolemia    Hypertension    Incomplete bladder emptying    Insomnia    Nausea & vomiting 09/08/2017   Postmenopausal atrophic vaginitis    RA (rheumatoid arthritis) (HCC)    Rectocele    Renal calculus    Skin cancer    Sleep apnea    Thyroid condition    Type 2 diabetes mellitus (HCC)    Urolithiasis    Vaginal enterocele    Past Surgical History:  Past Surgical History:  Procedure Laterality Date   AORTIC ARCH ANGIOGRAPHY N/A 10/22/2022   Procedure: AORTIC ARCH ANGIOGRAPHY;  Surgeon: Lance Muss  S, MD;  Location: [facility];  Service: Cardiovascular;  Laterality: N/A;   CARPAL TUNNEL RELEASE  2018   CATARACT EXTRACTION, BILATERAL     CHOLECYSTECTOMY     COLONOSCOPY     07/11/2009, 02/2015   GALLBLADDER SURGERY     KNEE ARTHROSCOPY Right 05/20/2020   Procedure: RIGHT KNEE ARTHROSCOPY AND DEBRIDEMENT;  Surgeon: Nadara Mustard, MD;  Location: [facility];  Service: Orthopedics;  Laterality: Right;   PARTIAL HYSTERECTOMY     Overies left intact   RIGHT/LEFT HEART CATH AND CORONARY ANGIOGRAPHY N/A 10/22/2022   Procedure: RIGHT/LEFT HEART CATH AND CORONARY ANGIOGRAPHY;  Surgeon: Corky Crafts, MD;   Location: [facility] INVASIVE CV LAB;  Service: Cardiovascular;  Laterality: N/A;   SQUAMOUS CELL CARCINOMA EXCISION  05/2018   Social History:  reports that she quit smoking about 44 years ago. Her smoking use included cigarettes. She started smoking about 54 years ago. She has a 2 pack-year smoking history. She has been exposed to tobacco smoke. She has never used smokeless tobacco. She reports that she does not drink alcohol and does not use drugs. Family History:  Family History  Problem Relation Age of Onset   Diabetes Mother    Heart disease Father 80   Diabetes Son    Crohn's disease Son      HOME MEDICATIONS: Allergies as of 03/22/2023       Reactions   Latex Rash   Amaryl [glimepiride]    Don't Remember   Amlodipine    LE edema on 7.5mg  daily. Tolerates 5mg  daily just fine   Codeine    SICK   Jardiance [empagliflozin]    Urinary tract infection- SIDE EFFECT   Pneumovax 23 [pneumococcal Vac Polyvalent]    Red / Swelling / Hot to touch SIDE EFFECTS   Wellbutrin [bupropion]    Unknown        Medication List        Accurate as of March 22, 2023 11:37 AM. If you have any questions, ask your nurse or doctor.          Accu-Chek Softclix Lancets lancets Use to test blood sugar three times daily. Dx: E11.8   aspirin EC 81 MG tablet Take 81 mg by mouth daily.   Biotin 1 MG Caps Take 1 mg by mouth.   carvedilol 6.25 MG tablet Commonly known as: COREG Take 2 tablets by mouth 2 (two) times daily with a meal.   chlorthalidone 25 MG tablet Commonly known as: HYGROTON Take 1 tablet (25 mg total) by mouth daily.   clobetasol cream 0.05 % Commonly known as: TEMOVATE Apply 1 Application topically as needed.   CRANBERRY EXTRACT PO Take 650 mg by mouth daily.   Enbrel SureClick 50 MG/ML injection Generic drug: etanercept Inject 50 mg into the skin once a week.   estradiol 0.1 MG/GM vaginal cream Commonly known as: ESTRACE Place 1 Applicatorful vaginally 3  (three) times a week.   fluocinonide 0.05 % external solution Commonly known as: LIDEX Apply 1 Application topically daily as needed.   FreeStyle Libre 2 Reader [employer] 2 Sensor Misc USE TO CHECK BLOOD SUGAR - CHECK EVERY 14 DAYS   HAIR SKIN & NAILS PO Take 1 tablet by mouth 2 (two) times daily.   Insulin Pen Needle 31G X 5 MM Misc 1 Device by Does not apply route daily in the afternoon.   irbesartan 300 MG tablet Commonly known as: AVAPRO Take  1 tablet (300 mg total) by mouth daily.   loperamide 2 MG tablet Commonly known as: IMODIUM A-D Take 2 mg by mouth daily as needed for diarrhea or loose stools.   LORazepam 0.5 MG tablet Commonly known as: ATIVAN Take 1 tablet (0.5 mg total) by mouth at bedtime.   MAGNESIUM CITRATE PO Take 250 mg by mouth daily as needed (leg cramps).   MELATONIN PO Take 10 mg by mouth at bedtime as needed (Sleep).   methocarbamol 500 MG tablet Commonly known as: ROBAXIN Take 1 tablet (500 mg total) by mouth daily as needed for muscle spasms.   nitrofurantoin (macrocrystal-monohydrate) 100 MG capsule Commonly known as: Macrobid Take 1 capsule (100 mg total) by mouth 2 (two) times daily.   nitroGLYCERIN 0.4 MG SL tablet Commonly known as: NITROSTAT Place 1 tablet (0.4 mg total) under the tongue every 5 (five) minutes as needed for chest pain.   Omeprazole 20 MG Tbdd Take 20 mg by mouth daily.   ondansetron 4 MG tablet Commonly known as: ZOFRAN TAKE 1 TABLET BY MOUTH 3 TIMES A DAY AS NEEDED FOR NAUSEA AND/OR VOMITING   Ozempic (0.25 or 0.5 MG/DOSE) 2 MG/1.5ML Sopn Generic drug: Semaglutide(0.25 or 0.5MG /DOS) Inject 0.5 mg into the skin once a week.   PROBIOTIC PO Take 1 tablet by mouth daily.   simvastatin 20 MG tablet Commonly known as: ZOCOR TAKE ONE TABLET BY MOUTH EVERY EVENING   SYSTANE COMPLETE OP Place 1-2 drops into both eyes daily as needed (Dry eye).   Evaristo Bury FlexTouch 200 UNIT/ML FlexTouch  Pen Generic drug: insulin degludec Inject 60 Units into the skin daily in the afternoon. What changed: how much to take   Vitamin D3 50 MCG (2000 UT) capsule Take 2,000 Units by mouth daily.   VOLTAREN EX Apply 1 application  topically 3 (three) times daily as needed (Arthritis).         OBJECTIVE:   Vital Signs: Ht 5\' 1"  (1.549 m)   Wt 158 lb (71.7 kg)   BMI 29.85 kg/m   Wt Readings from Last 3 Encounters:  03/22/23 158 lb (71.7 kg)  02/18/23 160 lb 6.4 oz (72.8 kg)  12/17/22 159 lb 9.6 oz (72.4 kg)     Exam: General: Pt appears well and is in NAD  Neck: General: Supple without adenopathy. Thyroid: Thyroid is prominent on the right  Lungs: Clear with good BS bilat with no rales, rhonchi, or wheezes  Heart: RRR with norm   Extremities: No pretibial edema.  Neuro: MS is good with appropriate affect, pt is alert and Ox3   DM Foot Exam 09/10/2022  The skin of the feet is intact without sores or ulcerations. The pedal pulses are 2+ on right and 2+ on left. The sensation is intact to a screening 5.07, 10 gram monofilament bilaterally    DATA REVIEWED:  Lab Results  Component Value Date   HGBA1C 9.2 (A) 03/22/2023   HGBA1C 7.8 (A) 09/10/2022   HGBA1C 8.6 (H) 07/07/2022    Latest Reference Range & Units 01/05/23 10:44  Sodium 135 - 146 mmol/L 140  Potassium 3.5 - 5.3 mmol/L 4.8  Chloride 98 - 110 mmol/L 103  CO2 20 - 32 mmol/L 30  Glucose 65 - 99 mg/dL 578 (H)  BUN 7 - 25 mg/dL 23  Creatinine 4.69 - 6.29 mg/dL 5.28 (H)  Calcium 8.6 - 10.4 mg/dL 9.1  BUN/Creatinine Ratio 6 - 22 (calc) 22  eGFR > OR = 60 mL/min/1.51m2 55 (L)  AG Ratio 1.0 - 2.5 (calc) 1.9  AST 10 - 35 U/L 11  ALT 6 - 29 U/L 12  Total Protein 6.1 - 8.1 g/dL 6.3  Total Bilirubin 0.2 - 1.2 mg/dL 1.2  (H): Data is abnormally high (L): Data is abnormally low  FNA of left medial 02/11/2010  INTERPRETATION(S):   THYROID, FINE NEEDLE ASPIRATION,LEFT #1:   Findings consistent with  non-neoplastic goiter     FNA left inferior thyroid nodule 02/11/2010  INTERPRETATION(S):   THYROID, FINE NEEDLE ASPIRATION, LEFT, #2:   Findings consistent with non-neoplastic goiter.   ASSESSMENT / PLAN / RECOMMENDATIONS:   1) Type 2 Diabetes Mellitus, Poorly  controlled, With CKD III complications - Most recent A1c of 9.2 %. Goal A1c < 7.5 %.    -A1c has increased from 7.8% to 9.2% -This is attributed to the patient's confusion regarding Ozempic dose, instead of taking 0.5 mg dose she has been taking 0.25 mg dose -I did advise the patient to continue to monitor her glucose and be more attentive and aware of her glucose readings, but she should have noticed that her glycemic control has deteriorated from last visit - She is on pt assistance for Ozempic, she declines increasing the dose due to nausea  - Limited exercise due to knee pains - She is intolerant to Jardiance  -I have recommended starting Humalog per correction scale as below before meals as well as bedtime -Patient to decrease [place] as she has self increased it due to hyperglycemia, discussed risk of hypoglycemia with self adjustments of insulin   MEDICATIONS: Increase Ozempic 0.5 mg weekly(samples) Decrease Tresiba 60 units daily Start CF: Humalog (BG-130/35) TIDQAC and QHS  EDUCATION / INSTRUCTIONS: BG monitoring instructions: Patient is instructed to check her blood sugars 2 times a day, fasting and suppertime. Call [facility] if: BG persistently < 70  I reviewed the Rule of 15 for the treatment of hypoglycemia in detail with the patient. Literature supplied.   2) Diabetic complications:  Eye: Does not have known diabetic retinopathy.  Neuro/ Feet: Does not have known diabetic peripheral neuropathy .  Renal: Patient does  have known baseline CKD. She   is  on an ACEI/ARB at present.    3) Dyslipidemia :   -  LDL  has been at goal , no change    Medication  Simvastatin 20 mg daily        F/U in 6 months         Signed electronically by: Lyndle Herrlich, MD  [facility]  [facility] [address] [place] [place], [place] [address] Phone: [phone] FAX: [phone]   CC: Octavia Heir, NP [address] [place] [address] Phone: [phone]  Fax: [phone]  Return to Endocrinology clinic as below: Future Appointments  Date Time Provider Department Center  04/14/2023  8:40 AM Octavia Heir, NP [facility] None  05/17/2023 10:00 AM Pollyann Savoy, MD [facility] None  05/19/2023 11:00 AM Octavia Heir, NP [facility] None

## 2023-03-22 ENCOUNTER — Encounter: Payer: Self-pay | Admitting: Internal Medicine

## 2023-03-22 ENCOUNTER — Ambulatory Visit: Payer: Medicare Other | Admitting: Internal Medicine

## 2023-03-22 VITALS — Ht 61.0 in | Wt 158.0 lb

## 2023-03-22 DIAGNOSIS — Z794 Long term (current) use of insulin: Secondary | ICD-10-CM

## 2023-03-22 DIAGNOSIS — E1165 Type 2 diabetes mellitus with hyperglycemia: Secondary | ICD-10-CM | POA: Diagnosis not present

## 2023-03-22 LAB — POCT GLYCOSYLATED HEMOGLOBIN (HGB A1C): Hemoglobin A1C: 9.2 % — AB (ref 4.0–5.6)

## 2023-03-22 MED ORDER — TRESIBA FLEXTOUCH 200 UNIT/ML ~~LOC~~ SOPN: 60.0000 [IU] | PEN_INJECTOR | Freq: Every day | SUBCUTANEOUS | 6 refills | Status: DC

## 2023-03-22 MED ORDER — INSULIN PEN NEEDLE 31G X 5 MM MISC: 1.0000 | Freq: Four times a day (QID) | 3 refills | Status: DC

## 2023-03-22 MED ORDER — INSULIN LISPRO (1 UNIT DIAL) 100 UNIT/ML (KWIKPEN): PEN_INJECTOR | SUBCUTANEOUS | 3 refills | Status: DC

## 2023-03-22 NOTE — Patient Instructions (Signed)
Decrease  Tresiba 60 units ONCE Daily  Increase  Ozempic 0.5 mg weekly  Humalog correctional insulin: Use the scale below to help guide you before each meal and bedtime   Blood sugar before meal Number of units to inject  Less than 165 0 unit  166 -  200 1 units  201 -  235 2 units  236 -  270 3 units  271 -  305 4 units  306 -  340 5 units  341 -  375 6 units  376 -  410 7 units  411 -  445 8 units       HOW TO TREAT LOW BLOOD SUGARS (Blood sugar LESS THAN 70 MG/DL) Please follow the RULE OF 15 for the treatment of hypoglycemia treatment (when your (blood sugars are less than 70 mg/dL)   STEP 1: Take 15 grams of carbohydrates when your blood sugar is low, which includes:  3-4 GLUCOSE TABS  OR 3-4 OZ OF JUICE OR REGULAR SODA OR ONE TUBE OF GLUCOSE GEL    STEP 2: RECHECK blood sugar in 15 MINUTES STEP 3: If your blood sugar is still low at the 15 minute recheck --> then, go back to STEP 1 and treat AGAIN with another 15 grams of carbohydrates.

## 2023-03-24 ENCOUNTER — Other Ambulatory Visit: Payer: Self-pay

## 2023-03-24 ENCOUNTER — Other Ambulatory Visit: Payer: Self-pay | Admitting: *Deleted

## 2023-03-24 DIAGNOSIS — Z79899 Other long term (current) drug therapy: Secondary | ICD-10-CM

## 2023-03-24 DIAGNOSIS — M0609 Rheumatoid arthritis without rheumatoid factor, multiple sites: Secondary | ICD-10-CM

## 2023-03-24 MED ORDER — INSULIN LISPRO (1 UNIT DIAL) 100 UNIT/ML (KWIKPEN): PEN_INJECTOR | SUBCUTANEOUS | 3 refills | Status: DC

## 2023-03-24 MED ORDER — ENBREL SURECLICK 50 MG/ML ~~LOC~~ SOAJ
50.0000 mg | SUBCUTANEOUS | 0 refills | Status: DC
Start: 2023-03-24 — End: 2023-06-21

## 2023-03-24 NOTE — Telephone Encounter (Signed)
Patient contacted the office and requested refill on Enbrel  Last Fill: 01/03/2023   Labs: 01/05/2023 Glucose is mildly elevated at 132, probably not a fasting sample.  Creatinine is elevated at 1.03, better than before.  Hemoglobin is low and stable.    TB Gold: 06/24/2022 Neg     Next Visit: 05/17/2023   Last Visit: 11/15/2022   GM:[id number] arthritis of multiple sites with negative rheumatoid factor    Current Dose per office note 11/15/2022: Enbrel 50 mg sq injections once weekly.   Patient reminded she is due to update labs at the beginning of October.    Okay to refill Enbrel?

## 2023-04-01 ENCOUNTER — Encounter: Payer: Self-pay | Admitting: Internal Medicine

## 2023-04-01 ENCOUNTER — Telehealth: Payer: Self-pay

## 2023-04-01 NOTE — Telephone Encounter (Signed)
Patient aware that [employer] patient assistance can be picked up.  Ozempic  5 boxes

## 2023-04-04 ENCOUNTER — Telehealth: Payer: Self-pay

## 2023-04-04 NOTE — Telephone Encounter (Signed)
Patient picked up patient assistance 

## 2023-04-04 NOTE — Telephone Encounter (Signed)
Patient Assistance (Ozempic) picked up by patient

## 2023-04-11 ENCOUNTER — Telehealth: Payer: Self-pay | Admitting: Internal Medicine

## 2023-04-11 MED ORDER — CARVEDILOL 6.25 MG PO TABS: 6.2500 mg | ORAL_TABLET | Freq: Two times a day (BID) | ORAL | 3 refills | Status: DC

## 2023-04-11 NOTE — Telephone Encounter (Signed)
Called pt advised of Providers recommendation to decrease carvedilol back to 6.25 mg PO BID.  Pt will monitor BP/HR daily and send in readings in 2 weeks for review. Pt expresses understanding no further questions at this time.

## 2023-04-11 NOTE — Telephone Encounter (Signed)
Pt states last night she felt like she had really bad indigestion last night. She states she felt it through her back. She states she believes it is related to some medicine she was put on.  She states she is also very fatigue. Please advise

## 2023-04-11 NOTE — Telephone Encounter (Signed)
Called pt in regards to discomfort.  Reports last night had chest and back pain.  Took a TUMS that did not help.  Had to sit up for discomfort to go away.   Takes Omeprazole 20 mg PO every day.  Denies eating foods that would aggravate GERD.   Denies feeling like something was sitting on chest.  Can't describe discomfort when asked if it feels achy or like a sharp pain pt replies I don't know it just hurt.   Reports started when carvedilol was increased to 6.25 mg 2 tables PO BID.  This started in June.  Since this change pt notes being more tired.  Does a lot of sitting prior to med change reports was active.  Does not keep a BP log.  Pt will check BP I will call back to get results.  Pt to sit quietly for 5 min before checking BP.   Called pt back BP 137/68-80.  Pt reports main concern is feeling fatigued since increase of carvedilol.

## 2023-04-14 ENCOUNTER — Encounter: Payer: Self-pay | Admitting: Orthopedic Surgery

## 2023-04-14 ENCOUNTER — Ambulatory Visit (INDEPENDENT_AMBULATORY_CARE_PROVIDER_SITE_OTHER): Payer: Medicare Other | Admitting: Orthopedic Surgery

## 2023-04-14 ENCOUNTER — Other Ambulatory Visit: Payer: Self-pay | Admitting: *Deleted

## 2023-04-14 VITALS — BP 148/76 | HR 83 | Temp 96.8°F | Resp 16 | Ht 61.0 in | Wt 158.8 lb

## 2023-04-14 DIAGNOSIS — E785 Hyperlipidemia, unspecified: Secondary | ICD-10-CM

## 2023-04-14 DIAGNOSIS — E1159 Type 2 diabetes mellitus with other circulatory complications: Secondary | ICD-10-CM | POA: Diagnosis not present

## 2023-04-14 DIAGNOSIS — Z23 Encounter for immunization: Secondary | ICD-10-CM

## 2023-04-14 DIAGNOSIS — Z79899 Other long term (current) drug therapy: Secondary | ICD-10-CM

## 2023-04-14 DIAGNOSIS — E1169 Type 2 diabetes mellitus with other specified complication: Secondary | ICD-10-CM

## 2023-04-14 DIAGNOSIS — I10 Essential (primary) hypertension: Secondary | ICD-10-CM

## 2023-04-14 DIAGNOSIS — I700 Atherosclerosis of aorta: Secondary | ICD-10-CM

## 2023-04-14 DIAGNOSIS — E119 Type 2 diabetes mellitus without complications: Secondary | ICD-10-CM

## 2023-04-14 DIAGNOSIS — I152 Hypertension secondary to endocrine disorders: Secondary | ICD-10-CM | POA: Diagnosis not present

## 2023-04-14 NOTE — Progress Notes (Signed)
Careteam: Patient Care Team: Annette Heir, NP as PCP - General (Adult Health Nurse Practitioner) Annette Constant, MD as PCP - Cardiology (Cardiology) Annette Suarez, OD (Optometry) Annette Barbara, NP as Nurse Practitioner  Seen by: Hazle Nordmann, AGNP-C  PLACE OF SERVICE:  [facility]  Advanced Directive information Does Patient Have a Medical Advance Directive?: No, Would patient like information on creating a medical advance directive?: No - Patient declined  Allergies  Allergen Reactions   Latex Rash   Amaryl [Glimepiride]     Don't Remember   Amlodipine     LE edema on 7.5mg  daily. Tolerates 5mg  daily just fine   Codeine     SICK   Jardiance [Empagliflozin]     Urinary tract infection- SIDE EFFECT   Pneumovax 23 [Pneumococcal Vac Polyvalent]     Red / Swelling / Hot to touch SIDE EFFECTS   Wellbutrin [Bupropion]     Unknown    Chief Complaint  Patient presents with   Medical Management of Chronic Issues    6 month follow up.    Health Maintenance    Discuss the need for Urine Microalbumin, and AWV.     HPI: Patient is a 82 y.o. female seen today for medical management of chronic conditions.   Fasting for lab work. Requesting to have lipid panel done later on at Rheumatology.   Followed by endocrinology. 09/17 A1c was 9.2. Ozempic was increased. No hypoglycemia. Admits to not always following diabetic diet. Remains on Tresiba and SSI  10/07 cardiology decreased carvedilol due to drowsiness. She did not take medication this morning. She is taking home blood pressures, reports < 140/80.   Flu vaccine given today.   Review of Systems:  Review of Systems  Constitutional:  Positive for malaise/fatigue.  HENT: Negative.    Eyes: Negative.   Respiratory:  Negative for cough, shortness of breath and wheezing.   Cardiovascular:  Negative for chest pain and leg swelling.  Gastrointestinal:  Negative for abdominal pain and blood in stool.  Genitourinary:   Negative for dysuria, frequency and hematuria.  Musculoskeletal:  Positive for joint pain. Negative for falls.  Skin: Negative.   Neurological:  Negative for dizziness, weakness and headaches.  Psychiatric/Behavioral:  Negative for depression. The patient is not nervous/anxious.     Past Medical History:  Diagnosis Date   Acute cystitis    UTI   Anxiety    Arthritis    Benign positional vertigo    Dermatitis    Diabetes mellitus without complication (HCC)    Family history of adverse reaction to anesthesia    GERD (gastroesophageal reflux disease)    Goiter, nontoxic, multinodular    Gout    H/O: hysterectomy    History of tobacco use    Hx laparoscopic cholecystectomy    Hypercholesterolemia    Hypertension    Incomplete bladder emptying    Insomnia    Nausea & vomiting 09/08/2017   Postmenopausal atrophic vaginitis    RA (rheumatoid arthritis) (HCC)    Rectocele    Renal calculus    Skin cancer    Sleep apnea    Thyroid condition    Type 2 diabetes mellitus (HCC)    Urolithiasis    Vaginal enterocele    Past Surgical History:  Procedure Laterality Date   AORTIC ARCH ANGIOGRAPHY N/A 10/22/2022   Procedure: AORTIC ARCH ANGIOGRAPHY;  Surgeon: Corky Crafts, MD;  Location: [facility]VAS[facility];  Service: Cardiovascular;  Laterality:  N/A;   CARPAL TUNNEL RELEASE  2018   CATARACT EXTRACTION, BILATERAL     CHOLECYSTECTOMY     COLONOSCOPY     07/11/2009, 02/2015   GALLBLADDER SURGERY     KNEE ARTHROSCOPY Right 05/20/2020   Procedure: RIGHT KNEE ARTHROSCOPY AND DEBRIDEMENT;  Surgeon: Nadara Mustard, MD;  Location: [facility];  Service: Orthopedics;  Laterality: Right;   PARTIAL HYSTERECTOMY     Overies left intact   RIGHT/LEFT HEART CATH AND CORONARY ANGIOGRAPHY N/A 10/22/2022   Procedure: RIGHT/LEFT HEART CATH AND CORONARY ANGIOGRAPHY;  Surgeon: Corky Crafts, MD;  Location: [facility] INVASIVE CV LAB;  Service: Cardiovascular;  Laterality: N/A;    SQUAMOUS CELL CARCINOMA EXCISION  05/2018   Social History:   reports that she quit smoking about 44 years ago. Her smoking use included cigarettes. She started smoking about 54 years ago. She has a 2 pack-year smoking history. She has been exposed to tobacco smoke. She has never used smokeless tobacco. She reports that she does not drink alcohol and does not use drugs.  Family History  Problem Relation Age of Onset   Diabetes Mother    Heart disease Father 23   Diabetes Son    Crohn's disease Son     Medications: Patient's Medications  New Prescriptions   No medications on file  Previous Medications   ACCU-CHEK SOFTCLIX LANCETS LANCETS    Use to test blood sugar three times daily. Dx: E11.8   ASPIRIN EC 81 MG TABLET    Take 81 mg by mouth daily.   BIOTIN 1 MG CAPS    Take 1 mg by mouth.   CARVEDILOL (COREG) 6.25 MG TABLET    Take 1 tablet (6.25 mg total) by mouth 2 (two) times daily.   CHLORTHALIDONE (HYGROTON) 25 MG TABLET    Take 1 tablet (25 mg total) by mouth daily.   CHOLECALCIFEROL (VITAMIN D3) 50 MCG (2000 UT) CAPSULE    Take 2,000 Units by mouth daily.   CLOBETASOL CREAM (TEMOVATE) 0.05 %    Apply 1 Application topically as needed.   CONTINUOUS BLOOD GLUC RECEIVER (FREESTYLE LIBRE 2 READER) DEVI       CONTINUOUS GLUCOSE SENSOR (FREESTYLE LIBRE 2 SENSOR) MISC    USE TO CHECK BLOOD SUGAR - CHECK EVERY 14 DAYS   CRANBERRY EXTRACT PO    Take 650 mg by mouth daily.   DICLOFENAC SODIUM (VOLTAREN EX)    Apply 1 application  topically 3 (three) times daily as needed (Arthritis).   ESTRADIOL (ESTRACE) 0.1 MG/GM VAGINAL CREAM    Place 1 Applicatorful vaginally 3 (three) times a week.   ETANERCEPT (ENBREL SURECLICK) 50 MG/ML INJECTION    Inject 50 mg into the skin once a week.   FLUOCINONIDE (LIDEX) 0.05 % EXTERNAL SOLUTION    Apply 1 Application topically daily as needed.   INSULIN DEGLUDEC (TRESIBA FLEXTOUCH) 200 UNIT/ML FLEXTOUCH PEN    Inject 60 Units into the skin daily in the  afternoon.   INSULIN LISPRO (HUMALOG KWIKPEN) 100 UNIT/ML KWIKPEN    Start CF: Humalog (BG-130/35) TIDQAC and at bedtime  Max daily 30 units   INSULIN PEN NEEDLE 31G X 5 MM MISC    1 Device by Does not apply route in the morning, at noon, in the evening, and at bedtime.   IRBESARTAN (AVAPRO) 300 MG TABLET    Take 1 tablet (300 mg total) by mouth daily.   LOPERAMIDE (IMODIUM A-D) 2 MG TABLET    Take  2 mg by mouth daily as needed for diarrhea or loose stools.   LORAZEPAM (ATIVAN) 0.5 MG TABLET    Take 1 tablet (0.5 mg total) by mouth at bedtime.   MAGNESIUM CITRATE PO    Take 250 mg by mouth daily as needed (leg cramps).   MELATONIN PO    Take 10 mg by mouth at bedtime as needed (Sleep).   METHOCARBAMOL (ROBAXIN) 500 MG TABLET    Take 1 tablet (500 mg total) by mouth daily as needed for muscle spasms.   MULTIPLE VITAMINS-MINERALS (HAIR SKIN & NAILS PO)    Take 1 tablet by mouth 2 (two) times daily.   NITROFURANTOIN, MACROCRYSTAL-MONOHYDRATE, (MACROBID) 100 MG CAPSULE    Take 1 capsule (100 mg total) by mouth 2 (two) times daily.   NITROGLYCERIN (NITROSTAT) 0.4 MG SL TABLET    Place 1 tablet (0.4 mg total) under the tongue every 5 (five) minutes as needed for chest pain.   OMEPRAZOLE 20 MG TBDD    Take 20 mg by mouth daily.   ONDANSETRON (ZOFRAN) 4 MG TABLET    TAKE 1 TABLET BY MOUTH 3 TIMES A DAY AS NEEDED FOR NAUSEA AND/OR VOMITING   PROBIOTIC PRODUCT (PROBIOTIC PO)    Take 1 tablet by mouth daily.   PROPYLENE GLYCOL (SYSTANE COMPLETE OP)    Place 1-2 drops into both eyes daily as needed (Dry eye).   SEMAGLUTIDE,0.25 OR 0.5MG /DOS, (OZEMPIC, 0.25 OR 0.5 MG/DOSE,) 2 MG/1.5ML SOPN    Inject 0.5 mg into the skin once a week.   SIMVASTATIN (ZOCOR) 20 MG TABLET    TAKE ONE TABLET BY MOUTH EVERY EVENING  Modified Medications   No medications on file  Discontinued Medications   No medications on file    Physical Exam:  Vitals:   04/14/23 0834  BP: (!) 152/70  Pulse: 83  Resp: 16  Temp: (!) 96.8  F (36 C)  SpO2: 97%  Weight: 158 lb 12.8 oz (72 kg)  Height: 5\' 1"  (1.549 m)   Body mass index is 30 kg/m. Wt Readings from Last 3 Encounters:  04/14/23 158 lb 12.8 oz (72 kg)  03/22/23 158 lb (71.7 kg)  02/18/23 160 lb 6.4 oz (72.8 kg)    Physical Exam Vitals reviewed.  Constitutional:      General: She is not in acute distress. HENT:     Head: Normocephalic.  Eyes:     General:        Right eye: No discharge.        Left eye: No discharge.  Cardiovascular:     Rate and Rhythm: Normal rate and regular rhythm.     Pulses: Normal pulses.     Heart sounds: Normal heart sounds.  Pulmonary:     Effort: Pulmonary effort is normal. No respiratory distress.     Breath sounds: Normal breath sounds. No wheezing.  Abdominal:     General: Bowel sounds are normal. There is no distension.     Palpations: Abdomen is soft.     Tenderness: There is no abdominal tenderness.  Musculoskeletal:     Cervical back: Neck supple.     Right lower leg: No edema.     Left lower leg: No edema.  Skin:    General: Skin is warm.     Capillary Refill: Capillary refill takes less than 2 seconds.  Neurological:     General: No focal deficit present.     Mental Status: She is alert and oriented to  person, place, and time.  Psychiatric:        Mood and Affect: Mood normal.     Labs reviewed: Basic Metabolic Panel: Recent Labs    06/24/22 1133 10/14/22 1238 10/22/22 1111 10/22/22 1120 10/22/22 1121 01/05/23 1044  NA 138 140   < > 140 140 140  K 4.8 4.3   < > 3.9 3.9 4.8  CL 101 99  --   --   --  103  CO2 28 27  --   --   --  30  GLUCOSE 198* 156*  --   --   --  132*  BUN 21 30*  --   --   --  23  CREATININE 1.26* 1.17*  --   --   --  1.03*  CALCIUM 9.3 9.5  --   --   --  9.1   < > = values in this interval not displayed.   Liver Function Tests: Recent Labs    06/24/22 1133 01/05/23 1044  AST 10 11  ALT 9 12  BILITOT 1.0 1.2  PROT 6.5 6.3   No results for input(s):  "LIPASE", "AMYLASE" in the last 8760 hours. No results for input(s): "AMMONIA" in the last 8760 hours. CBC: Recent Labs    06/24/22 1133 10/14/22 1238 10/22/22 1111 10/22/22 1120 10/22/22 1121 01/05/23 1044  WBC 10.3 11.5*  --   --   --  9.0  NEUTROABS 7,086  --   --   --   --  6,030  HGB 12.8 13.3   < > 11.6* 11.6* 11.6*  HCT 37.3 39.2   < > 34.0* 34.0* 33.6*  MCV 93.3 94  --   --   --  93.1  PLT 308 330  --   --   --  281   < > = values in this interval not displayed.   Lipid Panel: Recent Labs    12/21/22 0850  CHOL 130  HDL 56  LDLCALC 51  TRIG 151*  CHOLHDL 2.3   TSH: No results for input(s): "TSH" in the last 8760 hours. A1C: Lab Results  Component Value Date   HGBA1C 9.2 (A) 03/22/2023     Assessment/Plan: 1. Need for influenza vaccination - Flu Vaccine Trivalent High Dose (Fluad)  2. Diabetes mellitus with coincident hypertension (HCC) - followed by endocrinology - A1c 9.2 03/22/2023 - Ozempic increased  - cont Tresiba and SSI - admits to eating poorly  - discussed limiting carbs and sugars on diet - Microalbumin/Creatinine Ratio, Urine  3. Hypertension associated with diabetes (HCC) - followed by cardiology - 10/07 carvedilol decreased due to drowsiness - rechecked bp 148/76> did not take medication this morning - cont home blood pressures, goal < 130/80 - cont chlorthalidone  4. Aortic atherosclerosis (HCC) - noted on CT chest 2022 - cont asa and simvastatin  5. Hyperlipidemia associated with type 2 diabetes mellitus (HCC) - LDL 51 12/21/2022, goal < 70 - fasting today - plan to have checked at rheumatology - lipid panel orders placed   Total time: 32 minutes. Greater than 50% of total time spent doing patient education regarding health maintenance, T2DM, HTN, including diet precautions and symptom/medication management.    Next appt: 05/19/2023  Hazle Nordmann, Juel Burrow  [facility] [phone]

## 2023-04-14 NOTE — Patient Instructions (Addendum)
Limit sweets and crabs in diet  Ask Dr. Ferd Glassing office if you can have lipid panel done this morning> orders have been placed

## 2023-04-14 NOTE — Addendum Note (Signed)
Addended by: Henriette Combs on: 04/14/2023 09:49 AM   Modules accepted: Orders

## 2023-04-15 LAB — CBC WITH DIFFERENTIAL/PLATELET
Absolute Monocytes: 620 {cells}/uL (ref 200–950)
Basophils Absolute: 100 {cells}/uL (ref 0–200)
Basophils Relative: 1 %
Eosinophils Absolute: 270 {cells}/uL (ref 15–500)
Eosinophils Relative: 2.7 %
HCT: 38.3 % (ref 35.0–45.0)
Hemoglobin: 12.8 g/dL (ref 11.7–15.5)
Lymphs Abs: 2110 {cells}/uL (ref 850–3900)
MCH: 31.7 pg (ref 27.0–33.0)
MCHC: 33.4 g/dL (ref 32.0–36.0)
MCV: 94.8 fL (ref 80.0–100.0)
MPV: 11.4 fL (ref 7.5–12.5)
Monocytes Relative: 6.2 %
Neutro Abs: 6900 {cells}/uL (ref 1500–7800)
Neutrophils Relative %: 69 %
Platelets: 299 10*3/uL (ref 140–400)
RBC: 4.04 10*6/uL (ref 3.80–5.10)
RDW: 11.9 % (ref 11.0–15.0)
Total Lymphocyte: 21.1 %
WBC: 10 10*3/uL (ref 3.8–10.8)

## 2023-04-15 LAB — LIPID PANEL
Cholesterol: 153 mg/dL (ref <200)
HDL: 38 mg/dL — ABNORMAL LOW (ref >=50)
LDL Cholesterol (Calc): 86 mg/dL
Non-HDL Cholesterol (Calc): 115 mg/dL (ref <130)
Total CHOL/HDL Ratio: 4 (calc) (ref <5.0)
Triglycerides: 195 mg/dL — ABNORMAL HIGH (ref <150)

## 2023-04-15 LAB — COMPLETE METABOLIC PANEL WITH GFR
AG Ratio: 1.6 (calc) (ref 1.0–2.5)
ALT: 10 U/L (ref 6–29)
AST: 10 U/L (ref 10–35)
Albumin: 4.1 g/dL (ref 3.6–5.1)
Alkaline phosphatase (APISO): 96 U/L (ref 37–153)
BUN/Creatinine Ratio: 21 (calc) (ref 6–22)
BUN: 25 mg/dL (ref 7–25)
CO2: 28 mmol/L (ref 20–32)
Calcium: 9 mg/dL (ref 8.6–10.4)
Chloride: 101 mmol/L (ref 98–110)
Creat: 1.2 mg/dL — ABNORMAL HIGH (ref 0.60–0.95)
Globulin: 2.6 g/dL (ref 1.9–3.7)
Glucose, Bld: 218 mg/dL — ABNORMAL HIGH (ref 65–99)
Potassium: 4.5 mmol/L (ref 3.5–5.3)
Sodium: 138 mmol/L (ref 135–146)
Total Bilirubin: 0.9 mg/dL (ref 0.2–1.2)
Total Protein: 6.7 g/dL (ref 6.1–8.1)
eGFR: 46 mL/min/{1.73_m2} — ABNORMAL LOW (ref >=60)

## 2023-04-15 LAB — MICROALBUMIN / CREATININE URINE RATIO
Creatinine, Urine: 119 mg/dL (ref 20–275)
Microalb Creat Ratio: 25 mg/g{creat} (ref <30)
Microalb, Ur: 3 mg/dL

## 2023-04-15 NOTE — Progress Notes (Signed)
CBC is normal.  Creatinine is elevated at 1.2 and glucose is elevated.  Triglycerides are elevated.  Please forward results to her PCP.

## 2023-04-20 DIAGNOSIS — D0462 Carcinoma in situ of skin of left upper limb, including shoulder: Secondary | ICD-10-CM | POA: Diagnosis not present

## 2023-05-03 DIAGNOSIS — L218 Other seborrheic dermatitis: Secondary | ICD-10-CM | POA: Diagnosis not present

## 2023-05-03 DIAGNOSIS — Z85828 Personal history of other malignant neoplasm of skin: Secondary | ICD-10-CM | POA: Diagnosis not present

## 2023-05-03 DIAGNOSIS — L650 Telogen effluvium: Secondary | ICD-10-CM | POA: Diagnosis not present

## 2023-05-04 NOTE — Progress Notes (Deleted)
Office Visit Note  Patient: Annette Suarez             Date of Birth: Jul 27, 1940           MRN: [id number]             PCP: Octavia Heir, NP Referring: Octavia Heir, NP Visit Date: 05/17/2023 Occupation: @GUAROCC @  Subjective:  No chief complaint on file.   History of Present Illness: Annette Suarez is a 82 y.o. female ***     Activities of Daily Living:  Patient reports morning stiffness for *** {minute/hour:19697}.   Patient {ACTIONS;DENIES/REPORTS:21021675::"Denies"} nocturnal pain.  Difficulty dressing/grooming: {ACTIONS;DENIES/REPORTS:21021675::"Denies"} Difficulty climbing stairs: {ACTIONS;DENIES/REPORTS:21021675::"Denies"} Difficulty getting out of chair: {ACTIONS;DENIES/REPORTS:21021675::"Denies"} Difficulty using hands for taps, buttons, cutlery, and/or writing: {ACTIONS;DENIES/REPORTS:21021675::"Denies"}  No Rheumatology ROS completed.   PMFS History:  Patient Active Problem List   Diagnosis Date Noted   Coronary artery disease of native artery of native heart with stable angina pectoris (HCC) 01/27/2022   Hyperlipidemia associated with type 2 diabetes mellitus (HCC) 01/27/2022   Diabetes mellitus with coincident hypertension (HCC) 08/13/2021   Type 2 diabetes mellitus with stage 3a chronic kidney disease, with long-term current use of insulin (HCC) 08/13/2021   Multinodular goiter 08/13/2021   Subclavian arterial stenosis (HCC) 09/11/2020   Aortic atherosclerosis (HCC) 09/11/2020   Alteration in blood pressure 06/11/2020   Old complex tear of medial meniscus of right knee    Old complex tear of lateral meniscus of right knee    Primary osteoarthritis of both feet 12/13/2017   Dehydration 09/09/2017   Acute lower UTI 09/09/2017   Nausea & vomiting 09/08/2017   Primary osteoarthritis of both hands 04/05/2017   Left carpal tunnel syndrome 11/15/2016   Rheumatoid arthritis of multiple sites with negative rheumatoid factor (HCC) 10/06/2016   High risk medication  use 10/06/2016   Dyslipidemia 10/06/2016   Pain in both hands 09/08/2016   Hypertension associated with diabetes (HCC) 09/08/2016   Type 2 diabetes mellitus  09/08/2016   Gastroesophageal reflux disease without esophagitis 09/08/2016   Osteopenia of multiple sites 09/08/2016   History of anxiety 09/08/2016   Benign paroxysmal positional vertigo 09/08/2016   Sleep apnea 09/08/2016    Past Medical History:  Diagnosis Date   Acute cystitis    UTI   Anxiety    Arthritis    Benign positional vertigo    Dermatitis    Diabetes mellitus without complication (HCC)    Family history of adverse reaction to anesthesia    GERD (gastroesophageal reflux disease)    Goiter, nontoxic, multinodular    Gout    H/O: hysterectomy    History of tobacco use    Hx laparoscopic cholecystectomy    Hypercholesterolemia    Hypertension    Incomplete bladder emptying    Insomnia    Nausea & vomiting 09/08/2017   Postmenopausal atrophic vaginitis    RA (rheumatoid arthritis) (HCC)    Rectocele    Renal calculus    Skin cancer    Sleep apnea    Thyroid condition    Type 2 diabetes mellitus (HCC)    Urolithiasis    Vaginal enterocele     Family History  Problem Relation Age of Onset   Diabetes Mother    Heart disease Father 36   Diabetes Son    Crohn's disease Son    Past Surgical History:  Procedure Laterality Date   AORTIC ARCH ANGIOGRAPHY N/A 10/22/2022   Procedure: AORTIC ARCH ANGIOGRAPHY;  Surgeon: Corky Crafts, MD;  Location: [facility] INVASIVE CV LAB;  Service: Cardiovascular;  Laterality: N/A;   CARPAL TUNNEL RELEASE  2018   CATARACT EXTRACTION, BILATERAL     CHOLECYSTECTOMY     COLONOSCOPY     07/11/2009, 02/2015   GALLBLADDER SURGERY     KNEE ARTHROSCOPY Right 05/20/2020   Procedure: RIGHT KNEE ARTHROSCOPY AND DEBRIDEMENT;  Surgeon: Nadara Mustard, MD;  Location: [facility];  Service: Orthopedics;  Laterality: Right;   PARTIAL HYSTERECTOMY     Overies left intact    RIGHT/LEFT HEART CATH AND CORONARY ANGIOGRAPHY N/A 10/22/2022   Procedure: RIGHT/LEFT HEART CATH AND CORONARY ANGIOGRAPHY;  Surgeon: Corky Crafts, MD;  Location: [facility] INVASIVE CV LAB;  Service: Cardiovascular;  Laterality: N/A;   SQUAMOUS CELL CARCINOMA EXCISION  05/2018   Social History   Social History Narrative   Diet: Blank      Do you drink/ eat things with caffeine? Yes      Marital status:  Married                             What year were you married ? 196      Do you live in a house, apartment,assistred living, condo, trailer, etc.)? House      Is it one or more stories? 1      How many persons live in your home ? 2      Do you have any pets in your home ?(please list)  1 Sam-Dog      Highest Level of education completed: 12      Current or past profession: Office      Do you exercise?   Some                           Type & how often  Walk Daily      ADVANCED DIRECTIVES (Please bring copies)      Do you have a living will? Blank      Do you have a DNR form?  Blank                     If not, do you want to discuss one?       Do you have signed POA?HPOA forms?  Yes               If so, please bring to your appointment      FUNCTIONAL STATUS- To be completed by Spouse / child / Staff       Do you have difficulty bathing or dressing yourself ?  No      Do you have difficulty preparing food or eating ?  No      Do you have difficulty managing your mediation ?  No      Do you have difficulty managing your finances ?  No      Do you have difficulty affording your medication ?  Yes (Some)      Immunization History  Administered Date(s) Administered   Fluad Quad(high Dose 65+) 04/09/2021   Fluad Trivalent(High Dose 65+) 04/14/2023   Influenza Split 03/28/2008, 03/05/2010, 04/17/2012, 04/20/2013   Influenza, High Dose Seasonal PF 02/08/2020, 05/04/2022   Influenza-Unspecified 05/11/2011, 04/22/2014, 04/15/2015, 02/20/2016, 03/02/2017, 03/09/2018,  03/20/2019, 03/21/2020   PFIZER(Purple Top)SARS-COV-2 Vaccination 07/24/2019, 08/06/2019, 08/27/2019, 04/21/2020   Pneumococcal Conjugate-13 05/02/2014   Pneumococcal  Polysaccharide-23 04/23/2008   Tdap 05/01/2013   Zoster, Live 04/17/2012     Objective: Vital Signs: There were no vitals taken for this visit.   Physical Exam   Musculoskeletal Exam: ***  CDAI Exam: CDAI Score: -- Patient Global: --; Provider Global: -- Swollen: --; Tender: -- Joint Exam 05/17/2023   No joint exam has been documented for this visit   There is currently no information documented on the homunculus. Go to the Rheumatology activity and complete the homunculus joint exam.  Investigation: No additional findings.  Imaging: No results found.  Recent Labs: Lab Results  Component Value Date   WBC 10.0 04/14/2023   HGB 12.8 04/14/2023   PLT 299 04/14/2023   NA 138 04/14/2023   K 4.5 04/14/2023   CL 101 04/14/2023   CO2 28 04/14/2023   GLUCOSE 218 (H) 04/14/2023   BUN 25 04/14/2023   CREATININE 1.20 (H) 04/14/2023   BILITOT 0.9 04/14/2023   ALKPHOS 83 07/25/2019   AST 10 04/14/2023   ALT 10 04/14/2023   PROT 6.7 04/14/2023   ALBUMIN 4.6 07/25/2019   CALCIUM 9.0 04/14/2023   GFRAA 43 (L) 01/09/2021   QFTBGOLDPLUS NEGATIVE 06/24/2022    Speciality Comments: Recieves Enbrel through [employer] 02/20/2019 Osteopenia managed by Dr. Eli Phillips 02/20/2019  Procedures:  No procedures performed Allergies: Latex, Amaryl [glimepiride], Amlodipine, Codeine, Jardiance [empagliflozin], Pneumovax 23 [pneumococcal vac polyvalent], and Wellbutrin [bupropion]   Assessment / Plan:     Visit Diagnoses: No diagnosis found.  Orders: No orders of the defined types were placed in this encounter.  No orders of the defined types were placed in this encounter.   Face-to-face time spent with patient was *** minutes. Greater than 50% of time was spent in counseling and coordination of  care.  Follow-Up Instructions: No follow-ups on file.   Ellen Henri, CMA  Note - This record has been created using Animal nutritionist.  Chart creation errors have been sought, but may not always  have been located. Such creation errors do not reflect on  the standard of medical care.

## 2023-05-16 ENCOUNTER — Other Ambulatory Visit: Payer: Self-pay | Admitting: Adult Health

## 2023-05-16 DIAGNOSIS — G4709 Other insomnia: Secondary | ICD-10-CM

## 2023-05-16 DIAGNOSIS — F419 Anxiety disorder, unspecified: Secondary | ICD-10-CM

## 2023-05-17 ENCOUNTER — Ambulatory Visit: Payer: Medicare Other | Admitting: Rheumatology

## 2023-05-17 DIAGNOSIS — Z87898 Personal history of other specified conditions: Secondary | ICD-10-CM

## 2023-05-17 DIAGNOSIS — Z79899 Other long term (current) drug therapy: Secondary | ICD-10-CM

## 2023-05-17 DIAGNOSIS — M7061 Trochanteric bursitis, right hip: Secondary | ICD-10-CM

## 2023-05-17 DIAGNOSIS — M1711 Unilateral primary osteoarthritis, right knee: Secondary | ICD-10-CM

## 2023-05-17 DIAGNOSIS — M47816 Spondylosis without myelopathy or radiculopathy, lumbar region: Secondary | ICD-10-CM

## 2023-05-17 DIAGNOSIS — K219 Gastro-esophageal reflux disease without esophagitis: Secondary | ICD-10-CM

## 2023-05-17 DIAGNOSIS — R252 Cramp and spasm: Secondary | ICD-10-CM

## 2023-05-17 DIAGNOSIS — M0609 Rheumatoid arthritis without rheumatoid factor, multiple sites: Secondary | ICD-10-CM

## 2023-05-17 DIAGNOSIS — E118 Type 2 diabetes mellitus with unspecified complications: Secondary | ICD-10-CM

## 2023-05-17 DIAGNOSIS — I10 Essential (primary) hypertension: Secondary | ICD-10-CM

## 2023-05-17 DIAGNOSIS — M19041 Primary osteoarthritis, right hand: Secondary | ICD-10-CM

## 2023-05-17 DIAGNOSIS — M8589 Other specified disorders of bone density and structure, multiple sites: Secondary | ICD-10-CM

## 2023-05-17 DIAGNOSIS — Z8669 Personal history of other diseases of the nervous system and sense organs: Secondary | ICD-10-CM

## 2023-05-17 DIAGNOSIS — Z8659 Personal history of other mental and behavioral disorders: Secondary | ICD-10-CM

## 2023-05-17 DIAGNOSIS — Z87891 Personal history of nicotine dependence: Secondary | ICD-10-CM

## 2023-05-17 DIAGNOSIS — Z8639 Personal history of other endocrine, nutritional and metabolic disease: Secondary | ICD-10-CM

## 2023-05-17 DIAGNOSIS — M19071 Primary osteoarthritis, right ankle and foot: Secondary | ICD-10-CM

## 2023-05-17 NOTE — Telephone Encounter (Signed)
Patient has request refill on medication Lorazepam. Patient medication last refilled 02/21/2023. Patient has Non Opioid Contract on file dated 10/26/2022. Medication pend and sent to PCP Octavia Heir, NP

## 2023-05-18 ENCOUNTER — Telehealth: Payer: Self-pay | Admitting: Rheumatology

## 2023-05-18 ENCOUNTER — Telehealth: Payer: Self-pay | Admitting: Pharmacist

## 2023-05-18 NOTE — Telephone Encounter (Signed)
Pt called asking if she could be sent an application to apply for her medication (enbrel). Pt has an appointment on 05/31/23 at 11:20am.

## 2023-05-18 NOTE — Telephone Encounter (Signed)
Left VM that appliccation will be mailed to address on file

## 2023-05-18 NOTE — Telephone Encounter (Signed)
Patient requested [employer] PAP renewal for Enbrel for 2025 be mailed to home. Left VM for her today advising that application is being mailed to address on file.  Will await patient portion to be returned prior to completing MD portion  Chesley Mires, PharmD, MPH, BCPS, CPP Clinical Pharmacist (Rheumatology and Pulmonology)

## 2023-05-19 ENCOUNTER — Ambulatory Visit: Payer: Medicare Other | Admitting: Orthopedic Surgery

## 2023-05-19 ENCOUNTER — Encounter: Payer: Self-pay | Admitting: Orthopedic Surgery

## 2023-05-19 VITALS — BP 118/86 | HR 85 | Temp 96.7°F | Resp 16 | Ht 61.0 in | Wt 161.0 lb

## 2023-05-19 DIAGNOSIS — Z0000 Encounter for general adult medical examination without abnormal findings: Secondary | ICD-10-CM

## 2023-05-19 NOTE — Progress Notes (Signed)
Subjective:   Annette Suarez is a 82 y.o. female who presents for Medicare Annual (Subsequent) preventive examination.  Visit Complete: In person  Patient Medicare AWV questionnaire was completed by the patient on 05/19/2023; I have confirmed that all information answered by patient is correct and no changes since this date.  Cardiac Risk Factors include: advanced age (>27men, >46 women);diabetes mellitus;hypertension;sedentary lifestyle;obesity (BMI >30kg/m2);dyslipidemia     Objective:    Today's Vitals   05/19/23 1059  BP: 118/86  Pulse: 85  Resp: 16  Temp: (!) 96.7 F (35.9 C)  SpO2: 98%  Weight: 161 lb (73 kg)  Height: 5\' 1"  (1.549 m)   Body mass index is 30.42 kg/m.     05/19/2023   11:09 AM 04/14/2023    8:39 AM 02/18/2023    2:51 PM 12/17/2022    9:34 AM 10/22/2022    8:59 AM 10/21/2022   10:08 AM 07/20/2022    2:25 PM  Advanced Directives  Does Patient Have a Medical Advance Directive? No No No Yes Yes Yes No  Type of [employer] [place];Living will Healthcare Power of Attorney   Does patient want to make changes to medical advance directive?     No - Guardian declined No - Patient declined   Copy of Healthcare Power of Attorney in Chart?    No - copy requested  No - copy requested   Would patient like information on creating a medical advance directive? No - Patient declined No - Patient declined No - Patient declined    No - Patient declined    Current Medications (verified) Outpatient Encounter Medications as of 05/19/2023  Medication Sig   Accu-Chek Softclix Lancets lancets Use to test blood sugar three times daily. Dx: E11.8   aspirin EC 81 MG tablet Take 81 mg by mouth daily.   Biotin 1 MG CAPS Take 1 mg by mouth.   carvedilol (COREG) 6.25 MG tablet Take 1 tablet (6.25 mg total) by mouth 2 (two) times daily.   chlorthalidone (HYGROTON) 25 MG tablet Take 1 tablet (25 mg total) by mouth daily.    Cholecalciferol (VITAMIN D3) 50 MCG (2000 UT) capsule Take 2,000 Units by mouth daily.   clobetasol cream (TEMOVATE) 0.05 % Apply 1 Application topically as needed.   Continuous Blood Gluc Receiver (FREESTYLE LIBRE 2 READER) DEVI    Continuous Glucose Sensor (FREESTYLE LIBRE 2 SENSOR) MISC USE TO CHECK BLOOD SUGAR - CHECK EVERY 14 DAYS   CRANBERRY EXTRACT PO Take 650 mg by mouth daily.   Diclofenac Sodium (VOLTAREN EX) Apply 1 application  topically 3 (three) times daily as needed (Arthritis).   estradiol (ESTRACE) 0.1 MG/GM vaginal cream Place 1 Applicatorful vaginally 3 (three) times a week.   etanercept (ENBREL SURECLICK) 50 MG/ML injection Inject 50 mg into the skin once a week.   fluocinonide (LIDEX) 0.05 % external solution Apply 1 Application topically daily as needed.   insulin degludec (TRESIBA FLEXTOUCH) 200 UNIT/ML FlexTouch Pen Inject 60 Units into the skin daily in the afternoon.   insulin lispro (HUMALOG KWIKPEN) 100 UNIT/ML KwikPen Start CF: Humalog (BG-130/35) TIDQAC and at bedtime  Max daily 30 units   Insulin Pen Needle 31G X 5 MM MISC 1 Device by Does not apply route in the morning, at noon, in the evening, and at bedtime.   irbesartan (AVAPRO) 300 MG tablet Take 1 tablet (300 mg total) by mouth daily.   loperamide (  IMODIUM A-D) 2 MG tablet Take 2 mg by mouth daily as needed for diarrhea or loose stools.   LORazepam (ATIVAN) 0.5 MG tablet TAKE 1 TABLET BY MOUTH AT BEDTIME   MAGNESIUM CITRATE PO Take 250 mg by mouth daily as needed (leg cramps).   MELATONIN PO Take 10 mg by mouth at bedtime as needed (Sleep).   methocarbamol (ROBAXIN) 500 MG tablet Take 1 tablet (500 mg total) by mouth daily as needed for muscle spasms.   Multiple Vitamins-Minerals (HAIR SKIN & NAILS PO) Take 1 tablet by mouth 2 (two) times daily.   nitroGLYCERIN (NITROSTAT) 0.4 MG SL tablet Place 1 tablet (0.4 mg total) under the tongue every 5 (five) minutes as needed for chest pain.   Omeprazole 20 MG TBDD  Take 20 mg by mouth daily.   ondansetron (ZOFRAN) 4 MG tablet TAKE 1 TABLET BY MOUTH 3 TIMES A DAY AS NEEDED FOR NAUSEA AND/OR VOMITING   Probiotic Product (PROBIOTIC PO) Take 1 tablet by mouth daily.   Propylene Glycol (SYSTANE COMPLETE OP) Place 1-2 drops into both eyes daily as needed (Dry eye).   Semaglutide,0.25 or 0.5MG /DOS, (OZEMPIC, 0.25 OR 0.5 MG/DOSE,) 2 MG/1.5ML SOPN Inject 0.5 mg into the skin once a week.   simvastatin (ZOCOR) 20 MG tablet TAKE ONE TABLET BY MOUTH EVERY EVENING   [DISCONTINUED] nitrofurantoin, macrocrystal-monohydrate, (MACROBID) 100 MG capsule Take 1 capsule (100 mg total) by mouth 2 (two) times daily.   No facility-administered encounter medications on file as of 05/19/2023.    Allergies (verified) Latex, Amaryl [glimepiride], Amlodipine, Codeine, Jardiance [empagliflozin], Pneumovax 23 [pneumococcal vac polyvalent], and Wellbutrin [bupropion]   History: Past Medical History:  Diagnosis Date   Acute cystitis    UTI   Anxiety    Arthritis    Benign positional vertigo    Dermatitis    Diabetes mellitus without complication (HCC)    Family history of adverse reaction to anesthesia    GERD (gastroesophageal reflux disease)    Goiter, nontoxic, multinodular    Gout    H/O: hysterectomy    History of tobacco use    Hx laparoscopic cholecystectomy    Hypercholesterolemia    Hypertension    Incomplete bladder emptying    Insomnia    Nausea & vomiting 09/08/2017   Postmenopausal atrophic vaginitis    RA (rheumatoid arthritis) (HCC)    Rectocele    Renal calculus    Skin cancer    Sleep apnea    Thyroid condition    Type 2 diabetes mellitus (HCC)    Urolithiasis    Vaginal enterocele    Past Surgical History:  Procedure Laterality Date   AORTIC ARCH ANGIOGRAPHY N/A 10/22/2022   Procedure: AORTIC ARCH ANGIOGRAPHY;  Surgeon: Corky Crafts, MD;  Location: [facility];  Service: Cardiovascular;  Laterality: N/A;   CARPAL TUNNEL RELEASE   2018   CATARACT EXTRACTION, BILATERAL     CHOLECYSTECTOMY     COLONOSCOPY     07/11/2009, 02/2015   GALLBLADDER SURGERY     KNEE ARTHROSCOPY Right 05/20/2020   Procedure: RIGHT KNEE ARTHROSCOPY AND DEBRIDEMENT;  Surgeon: Nadara Mustard, MD;  Location: [facility];  Service: Orthopedics;  Laterality: Right;   PARTIAL HYSTERECTOMY     [place] left intact   RIGHT/LEFT HEART CATH AND CORONARY ANGIOGRAPHY N/A 10/22/2022   Procedure: RIGHT/LEFT HEART CATH AND CORONARY ANGIOGRAPHY;  Surgeon: Corky Crafts, MD;  Location: [facility];  Service: Cardiovascular;  Laterality: N/A;  SQUAMOUS CELL CARCINOMA EXCISION  05/2018   Family History  Problem Relation Age of Onset   Diabetes Mother    Heart disease Father 25   Diabetes Son    Crohn's disease Son    Social History   Socioeconomic History   Marital status: Married    Spouse name: Not on file   Number of children: Not on file   Years of education: Not on file   Highest education level: Not on file  Occupational History   Not on file  Tobacco Use   Smoking status: Former    Current packs/day: 0.00    Average packs/day: 0.2 packs/day for 10.0 years (2.0 ttl pk-yrs)    Types: Cigarettes    Start date: 07/05/1968    Quit date: 07/05/1978    Years since quitting: 44.9    Passive exposure: Past   Smokeless tobacco: Never  Vaping Use   Vaping status: Never Used  Substance and Sexual Activity   Alcohol use: No   Drug use: No   Sexual activity: Not Currently  Other Topics Concern   Not on file  Social History Narrative   Diet: Blank      Do you drink/ eat things with caffeine? Yes      Marital status:  Married                             What year were you married ? 196      Do you live in a house, apartment,assistred living, condo, trailer, etc.)? House      Is it one or more stories? 1      How many persons live in your home ? 2      Do you have any pets in your home ?(please list)  1 Sam-Dog       Highest Level of education completed: 12      Current or past profession: Office      Do you exercise?   Some                           Type & how often  Walk Daily      ADVANCED DIRECTIVES (Please bring copies)      Do you have a living will? Blank      Do you have a DNR form?  Blank                     If not, do you want to discuss one?       Do you have signed POA?HPOA forms?  Yes               If so, please bring to your appointment      FUNCTIONAL STATUS- To be completed by Spouse / child / Staff       Do you have difficulty bathing or dressing yourself ?  No      Do you have difficulty preparing food or eating ?  No      Do you have difficulty managing your mediation ?  No      Do you have difficulty managing your finances ?  No      Do you have difficulty affording your medication ?  Yes (Some)      Social Determinants of Health   Financial Resource Strain: Low Risk  (05/19/2023)   Overall Financial Resource  Strain (CARDIA)    Difficulty of Paying Living Expenses: Not hard at all  Food Insecurity: No Food Insecurity (05/19/2023)   Hunger Vital Sign    Worried About Running Out of Food in the Last Year: Never true    Ran Out of Food in the Last Year: Never true  Transportation Needs: No Transportation Needs (05/19/2023)   PRAPARE - Administrator, Civil Service (Medical): No    Lack of Transportation (Non-Medical): No  Physical Activity: Inactive (05/19/2023)   Exercise Vital Sign    Days of Exercise per Week: 0 days    Minutes of Exercise per Session: 0 min  Stress: No Stress Concern Present (05/19/2023)   [employer] of Occupational Health - Occupational Stress Questionnaire    Feeling of Stress : Not at all  Social Connections: Moderately Isolated (05/19/2023)   Social Connection and Isolation Panel [NHANES]    Frequency of Communication with Friends and Family: Three times a week    Frequency of Social Gatherings with Friends and  Family: Twice a week    Attends Religious Services: Never    Diplomatic Services operational officer: Not on file    Attends Banker Meetings: Never    Marital Status: Married    Tobacco Counseling Counseling given: Not Answered   Clinical Intake:  Pre-visit preparation completed: No  Pain : No/denies pain     BMI - recorded: 30.42 Nutritional Status: BMI > 30  Obese Nutritional Risks: None Diabetes: Yes CBG done?: Yes (88 with home glucometer) CBG resulted in Enter/ Edit results?: No Did pt. bring in CBG monitor from home?: Yes Glucose Meter Downloaded?: No  How often do you need to have someone help you when you read instructions, pamphlets, or other written materials from your doctor or pharmacy?: 1 - Never What is the last grade level you completed in school?: high school         Activities of Daily Living    05/19/2023   11:24 AM  In your present state of health, do you have any difficulty performing the following activities:  Hearing? 0  Vision? 0  Difficulty concentrating or making decisions? 0  Walking or climbing stairs? 1  Dressing or bathing? 0  Doing errands, shopping? 0  Preparing Food and eating ? N  Using the Toilet? N  In the past six months, have you accidently leaked urine? N  Do you have problems with loss of bowel control? N  Managing your Medications? N  Managing your Finances? N  Housekeeping or managing your Housekeeping? N    Patient Care Team: Octavia Heir, NP as PCP - General (Adult Health Nurse Practitioner) [name], MD as PCP - Cardiology (Cardiology) [name], OD (Optometry) [name], NP as Nurse Practitioner  Indicate any recent Medical Services you may have received from other than [facility] providers in the past year (date may be approximate).     Assessment:   This is a routine wellness examination for Doristine.  Hearing/Vision screen Hearing Screening - Comments:: No hearing  concerns.  Vision Screening - Comments:: No vision concerns.    Goals Addressed             This Visit's Progress    Weight (lb) < 150 lb (68 kg)   161 lb (73 kg)    Weight (lb) < 200 lb (90.7 kg)   161 lb (73 kg)      Depression Screen  05/19/2023   11:29 AM 05/19/2023   11:09 AM 04/14/2023    9:56 AM 11/02/2022    2:02 PM 05/11/2022    9:37 AM 04/01/2022    9:08 AM 10/05/2021   10:07 AM  PHQ 2/9 Scores  PHQ - 2 Score 0 0 0 0 0 0 0    Fall Risk    05/19/2023   11:30 AM 05/19/2023   11:09 AM 04/14/2023    9:56 AM 04/14/2023    8:39 AM 02/18/2023    2:51 PM  Fall Risk   Falls in the past year? 0 0 0 0 0  Number falls in past yr: 0 0 0 0 0  Injury with Fall? 0 0 0 0 0  Risk for fall due to : History of fall(s);Impaired balance/gait;Impaired mobility No Fall Risks History of fall(s);Impaired balance/gait;Impaired mobility No Fall Risks No Fall Risks  Follow up Falls evaluation completed;Education provided;Falls prevention discussed Falls evaluation completed;Education provided;Falls prevention discussed Falls evaluation completed;Education provided;Falls prevention discussed Falls evaluation completed;Education provided;Falls prevention discussed Falls evaluation completed;Education provided;Falls prevention discussed    MEDICARE RISK AT HOME: Medicare Risk at Home Any stairs in or around the home?: Yes If so, are there any without handrails?: Yes Home free of loose throw rugs in walkways, pet beds, electrical cords, etc?: Yes Adequate lighting in your home to reduce risk of falls?: Yes Life alert?: No Use of a cane, walker or w/c?: Yes Grab bars in the bathroom?: Yes Shower chair or bench in shower?: Yes Elevated toilet seat or a handicapped toilet?: Yes  TIMED UP AND GO:  Was the test performed?  No    Cognitive Function:    05/19/2023   11:09 AM 06/24/2020    1:18 PM  MMSE - Mini Mental State Exam  Orientation to time 5 5  Orientation to Place 5 5   Registration 3 3  Attention/ Calculation 5 5  Recall 3 3  Language- name 2 objects 2 2  Language- repeat 1 1  Language- follow 3 step command 3 3  Language- read & follow direction 0 1  Language-read & follow direction-comments patient closed eyes but didnt read out loud as instructed.   Write a sentence 0 1  Write a sentence-comments patient wrote a question and NOT a sentence as instructed.   Copy design 0 0  Copy design-comments  passed clock test  Total score 27 29        05/11/2022    9:39 AM 05/05/2021    8:38 AM  6CIT Screen  What Year? 0 points 0 points  What month? 0 points 0 points  What time? 0 points 0 points  Count back from 20 0 points 0 points  Months in reverse 0 points 0 points  Repeat phrase 2 points 2 points  Total Score 2 points 2 points    Immunizations Immunization History  Administered Date(s) Administered   Fluad Quad(high Dose 65+) 04/09/2021   Fluad Trivalent(High Dose 65+) 04/14/2023   Influenza Split 03/28/2008, 03/05/2010, 04/17/2012, 04/20/2013   Influenza, High Dose Seasonal PF 02/08/2020, 05/04/2022   Influenza-Unspecified 05/11/2011, 04/22/2014, 04/15/2015, 02/20/2016, 03/02/2017, 03/09/2018, 03/20/2019, 03/21/2020   PFIZER(Purple Top)SARS-COV-2 Vaccination 07/24/2019, 08/06/2019, 08/27/2019, 04/21/2020   Pneumococcal Conjugate-13 05/02/2014   Pneumococcal Polysaccharide-23 04/23/2008   Td 05/01/2013   Tdap 05/01/2013   Zoster Recombinant(Shingrix) 04/17/2012    TDAP status: Due, Education has been provided regarding the importance of this vaccine. Advised may receive this vaccine at local pharmacy or Health Dept.  Aware to provide a copy of the vaccination record if obtained from local pharmacy or Health Dept. Verbalized acceptance and understanding.  Flu Vaccine status: Up to date  Pneumococcal vaccine status: Up to date  Covid-19 vaccine status: Completed vaccines  Qualifies for Shingles Vaccine? Yes   Zostavax completed No    Shingrix Completed?: Yes  Screening Tests Health Maintenance  Topic Date Due   DTaP/Tdap/Td (3 - Td or Tdap) 05/02/2023   OPHTHALMOLOGY EXAM  07/09/2023   HEMOGLOBIN A1C  09/19/2023   Diabetic kidney evaluation - eGFR measurement  04/13/2024   Diabetic kidney evaluation - Urine ACR  04/13/2024   FOOT EXAM  04/13/2024   Medicare Annual Wellness (AWV)  05/18/2024   Pneumonia Vaccine 15+ Years old  Completed   INFLUENZA VACCINE  Completed   DEXA SCAN  Completed   HPV VACCINES  Aged Out   COVID-19 Vaccine  Discontinued   Hepatitis C Screening  Discontinued   Zoster Vaccines- Shingrix  Discontinued    Health Maintenance  Health Maintenance Due  Topic Date Due   DTaP/Tdap/Td (3 - Td or Tdap) 05/02/2023    Colorectal cancer screening: No longer required.   Mammogram status: Completed 07/12/2022. Repeat every year  Bone Density status: Completed 2013. Results reflect: Bone density results: NORMAL. Repeat every will schedule in 2025 years.  Lung Cancer Screening: (Low Dose CT Chest recommended if Age 60-80 years, 20 pack-year currently smoking OR have quit w/in 15years.) does not qualify.   Lung Cancer Screening Referral: No  Additional Screening:  Hepatitis C Screening: does not qualify; Completed   Vision Screening: Recommended annual ophthalmology exams for early detection of glaucoma and other disorders of the eye. Is the patient up to date with their annual eye exam?  Yes  Who is the provider or what is the name of the office in which the patient attends annual eye exams? Dr. Hyacinth Meeker If pt is not established with a provider, would they like to be referred to a provider to establish care? No .   Dental Screening: Recommended annual dental exams for proper oral hygiene  Diabetic Foot Exam: Diabetic Foot Exam: Completed 05/19/2023  Community Resource Referral / Chronic Care Management: CRR required this visit?  No   CCM required this visit?  No     Plan:     I  have personally reviewed and noted the following in the patient's chart:   Medical and social history Use of alcohol, tobacco or illicit drugs  Current medications and supplements including opioid prescriptions. Patient is not currently taking opioid prescriptions. Functional ability and status Nutritional status Physical activity Advanced directives List of other physicians Hospitalizations, surgeries, and ER visits in previous 12 months Vitals Screenings to include cognitive, depression, and falls Referrals and appointments  In addition, I have reviewed and discussed with patient certain preventive protocols, quality metrics, and best practice recommendations. A written personalized care plan for preventive services as well as general preventive health recommendations were provided to patient.     Octavia Heir, NP   05/19/2023   After Visit Summary: (MyChart) Due to this being a telephonic visit, the after visit summary with patients personalized plan was offered to patient via MyChart   Nurse Notes: MMSE 27/30. Needs mammogram and DEXA done next year.

## 2023-05-19 NOTE — Patient Instructions (Signed)
  Annette Suarez , Thank you for taking time to come for your Medicare Wellness Visit. I appreciate your ongoing commitment to your health goals. Please review the following plan we discussed and let me know if I can assist you in the future.   These are the goals we discussed:  Goals      Weight (lb) < 150 lb (68 kg)     Weight (lb) < 200 lb (90.7 kg)        This is a list of the screening recommended for you and due dates:  Health Maintenance  Topic Date Due   DTaP/Tdap/Td vaccine (3 - Td or Tdap) 05/02/2023   Eye exam for diabetics  07/09/2023   Hemoglobin A1C  09/19/2023   Yearly kidney function blood test for diabetes  04/13/2024   Yearly kidney health urinalysis for diabetes  04/13/2024   Complete foot exam   04/13/2024   Medicare Annual Wellness Visit  05/18/2024   Pneumonia Vaccine  Completed   Flu Shot  Completed   DEXA scan (bone density measurement)  Completed   HPV Vaccine  Aged Out   COVID-19 Vaccine  Discontinued   Hepatitis C Screening  Discontinued   Zoster (Shingles) Vaccine  Discontinued   Recommend scheduling mammogram and bone density with [facility] at same time  Please get tetanus/ Tdap vaccine at local pharmacy

## 2023-05-19 NOTE — Progress Notes (Signed)
Office Visit Note  Patient: Annette Suarez             Date of Birth: [birth year]           MRN: [id number]             PCP: Octavia Heir, NP Referring: Octavia Heir, NP Visit Date: 05/31/2023 Occupation: @GUAROCC @  Subjective:  Right knee pain  History of Present Illness: Annette Suarez is a 82 y.o. female with seronegative rheumatoid arthritis, osteoarthritis and degenerative disc disease.  She states she continues to have pain and discomfort in her right knee joint.  She has not had any relief since she had arthroscopic surgery on the right knee.  She has intermittent discomfort in her bilateral wrist joints.  She has not noticed any joint swelling.  She has been taking Enbrel 50 mg subcu weekly without any interruption.  Lower back pain is better currently.    Activities of Daily Living:  Patient reports morning stiffness for 2 hours.   Patient Denies nocturnal pain.  Difficulty dressing/grooming: Denies Difficulty climbing stairs: Reports Difficulty getting out of chair: Denies Difficulty using hands for taps, buttons, cutlery, and/or writing: Reports  Review of Systems  Constitutional:  Positive for fatigue.  HENT:  Positive for mouth dryness. Negative for mouth sores.   Eyes:  Positive for dryness.  Respiratory:  Positive for shortness of breath.   Cardiovascular:  Negative for chest pain and palpitations.  Gastrointestinal:  Negative for blood in stool, constipation and diarrhea.  Endocrine: Negative for increased urination.  Genitourinary:  Negative for involuntary urination.  Musculoskeletal:  Positive for gait problem, joint swelling and morning stiffness. Negative for joint pain, joint pain, myalgias, muscle weakness, muscle tenderness and myalgias.  Skin:  Positive for hair loss. Negative for color change, rash and sensitivity to sunlight.  Allergic/Immunologic: Negative for susceptible to infections.  Neurological:  Positive for dizziness. Negative for headaches.   Hematological:  Negative for swollen glands.  Psychiatric/Behavioral:  Positive for depressed mood and sleep disturbance. The patient is nervous/anxious.     PMFS History:  Patient Active Problem List   Diagnosis Date Noted   Coronary artery disease of native artery of native heart with stable angina pectoris (HCC) 01/27/2022   Hyperlipidemia associated with type 2 diabetes mellitus (HCC) 01/27/2022   Diabetes mellitus with coincident hypertension (HCC) 08/13/2021   Type 2 diabetes mellitus with stage 3a chronic kidney disease, with long-term current use of insulin (HCC) 08/13/2021   Multinodular goiter 08/13/2021   Subclavian arterial stenosis (HCC) 09/11/2020   Aortic atherosclerosis (HCC) 09/11/2020   Alteration in blood pressure 06/11/2020   Old complex tear of medial meniscus of right knee    Old complex tear of lateral meniscus of right knee    Primary osteoarthritis of both feet 12/13/2017   Dehydration 09/09/2017   Acute lower UTI 09/09/2017   Nausea & vomiting 09/08/2017   Primary osteoarthritis of both hands 04/05/2017   Left carpal tunnel syndrome 11/15/2016   Rheumatoid arthritis of multiple sites with negative rheumatoid factor (HCC) 10/06/2016   High risk medication use 10/06/2016   Dyslipidemia 10/06/2016   Pain in both hands 09/08/2016   Hypertension associated with diabetes (HCC) 09/08/2016   Type 2 diabetes mellitus  09/08/2016   Gastroesophageal reflux disease without esophagitis 09/08/2016   Osteopenia of multiple sites 09/08/2016   History of anxiety 09/08/2016   Benign paroxysmal positional vertigo 09/08/2016   Sleep apnea 09/08/2016    Past  Medical History:  Diagnosis Date   Acute cystitis    UTI   Anxiety    Arthritis    Benign positional vertigo    Dermatitis    Diabetes mellitus without complication (HCC)    Family history of adverse reaction to anesthesia    GERD (gastroesophageal reflux disease)    Goiter, nontoxic, multinodular    Gout     H/O: hysterectomy    History of tobacco use    Hx laparoscopic cholecystectomy    Hypercholesterolemia    Hypertension    Incomplete bladder emptying    Insomnia    Nausea & vomiting 09/08/2017   Postmenopausal atrophic vaginitis    RA (rheumatoid arthritis) (HCC)    Rectocele    Renal calculus    Skin cancer    Sleep apnea    Thyroid condition    Type 2 diabetes mellitus (HCC)    Urolithiasis    Vaginal enterocele     Family History  Problem Relation Age of Onset   Diabetes Mother    Heart disease Father 73   Diabetes Son    Crohn's disease Son    Past Surgical History:  Procedure Laterality Date   AORTIC ARCH ANGIOGRAPHY N/A 10/22/2022   Procedure: AORTIC ARCH ANGIOGRAPHY;  Surgeon: Corky Crafts, MD;  Location: [facility];  Service: Cardiovascular;  Laterality: N/A;   CARPAL TUNNEL RELEASE  2018   CATARACT EXTRACTION, BILATERAL     CHOLECYSTECTOMY     COLONOSCOPY     07/11/2009, 02/2015   GALLBLADDER SURGERY     KNEE ARTHROSCOPY Right 05/20/2020   Procedure: RIGHT KNEE ARTHROSCOPY AND DEBRIDEMENT;  Surgeon: Nadara Mustard, MD;  Location: [facility];  Service: Orthopedics;  Laterality: Right;   PARTIAL HYSTERECTOMY     Overies left intact   RIGHT/LEFT HEART CATH AND CORONARY ANGIOGRAPHY N/A 10/22/2022   Procedure: RIGHT/LEFT HEART CATH AND CORONARY ANGIOGRAPHY;  Surgeon: Corky Crafts, MD;  Location: [facility];  Service: Cardiovascular;  Laterality: N/A;   SQUAMOUS CELL CARCINOMA EXCISION  05/2018   Social History   Social History Narrative   Diet: Blank      Do you drink/ eat things with caffeine? Yes      Marital status:  Married                             What year were you married ? 196      Do you live in a house, apartment,assistred living, condo, trailer, etc.)? House      Is it one or more stories? 1      How many persons live in your home ? 2      Do you have any pets in your home ?(please list)  1 Sam-Dog       Highest Level of education completed: 12      Current or past profession: Office      Do you exercise?   Some                           Type & how often  Walk Daily      ADVANCED DIRECTIVES (Please bring copies)      Do you have a living will? Blank      Do you have a DNR form?  Blank  If not, do you want to discuss one?       Do you have signed POA?HPOA forms?  Yes               If so, please bring to your appointment      FUNCTIONAL STATUS- To be completed by Spouse / child / Staff       Do you have difficulty bathing or dressing yourself ?  No      Do you have difficulty preparing food or eating ?  No      Do you have difficulty managing your mediation ?  No      Do you have difficulty managing your finances ?  No      Do you have difficulty affording your medication ?  Yes (Some)      Immunization History  Administered Date(s) Administered   Fluad Quad(high Dose 65+) 04/09/2021   Fluad Trivalent(High Dose 65+) 04/14/2023   Influenza Split 03/28/2008, 03/05/2010, 04/17/2012, 04/20/2013   Influenza, High Dose Seasonal PF 02/08/2020, 05/04/2022   Influenza-Unspecified 05/11/2011, 04/22/2014, 04/15/2015, 02/20/2016, 03/02/2017, 03/09/2018, 03/20/2019, 03/21/2020   PFIZER(Purple Top)SARS-COV-2 Vaccination 07/24/2019, 08/06/2019, 08/27/2019, 04/21/2020   Pneumococcal Conjugate-13 05/02/2014   Pneumococcal Polysaccharide-23 04/23/2008   Td 05/01/2013   Tdap 05/01/2013   Zoster Recombinant(Shingrix) 04/17/2012     Objective: Vital Signs: BP (!) 157/76 (BP Location: Right Arm, Patient Position: Sitting, Cuff Size: Normal)   Pulse 88   Resp 17   Ht 5\' 1"  (1.549 m)   Wt 160 lb (72.6 kg)   BMI 30.23 kg/m    Physical Exam Vitals and nursing note reviewed.  Constitutional:      Appearance: She is well-developed.  HENT:     Head: Normocephalic and atraumatic.  Eyes:     Conjunctiva/sclera: Conjunctivae normal.  Cardiovascular:     Rate and  Rhythm: Normal rate and regular rhythm.     Heart sounds: Normal heart sounds.  Pulmonary:     Effort: Pulmonary effort is normal.     Breath sounds: Normal breath sounds.  Abdominal:     General: Bowel sounds are normal.     Palpations: Abdomen is soft.  Musculoskeletal:     Cervical back: Normal range of motion.  Lymphadenopathy:     Cervical: No cervical adenopathy.  Skin:    General: Skin is warm and dry.     Capillary Refill: Capillary refill takes less than 2 seconds.  Neurological:     Mental Status: She is alert and oriented to person, place, and time.  Psychiatric:        Behavior: Behavior normal.      Musculoskeletal Exam: She had good range of motion of the cervical spine.  Thoracic kyphosis was noted without any tenderness over thoracic or lumbar spine.  Shoulders, elbows, wrist joints, MCPs and PIPs and DIPs been good range of motion with no synovitis.  PIP and DIP prominence was noted.  Hip joints and knee joints were in good range of motion.  She had discomfort range of motion of her right knee joint.  No warmth swelling or effusion was noted.  There was no tenderness over ankles or MTPs.  CDAI Exam: CDAI Score: -- Patient Global: --; Provider Global: -- Swollen: --; Tender: -- Joint Exam 05/31/2023   No joint exam has been documented for this visit   There is currently no information documented on the homunculus. Go to the Rheumatology activity and complete the homunculus joint exam.  Investigation: No  additional findings.  Imaging: No results found.  Recent Labs: Lab Results  Component Value Date   WBC 10.0 04/14/2023   HGB 12.8 04/14/2023   PLT 299 04/14/2023   NA 138 04/14/2023   K 4.5 04/14/2023   CL 101 04/14/2023   CO2 28 04/14/2023   GLUCOSE 218 (H) 04/14/2023   BUN 25 04/14/2023   CREATININE 1.20 (H) 04/14/2023   BILITOT 0.9 04/14/2023   ALKPHOS 83 07/25/2019   AST 10 04/14/2023   ALT 10 04/14/2023   PROT 6.7 04/14/2023   ALBUMIN 4.6  07/25/2019   CALCIUM 9.0 04/14/2023   GFRAA 43 (L) 01/09/2021   QFTBGOLDPLUS NEGATIVE 06/24/2022    Speciality Comments: Recieves Enbrel through [facility] 02/20/2019 Osteopenia managed by Dr. Eli Phillips 02/20/2019  Procedures:  No procedures performed Allergies: Latex, Amaryl [glimepiride], Amlodipine, Codeine, Jardiance [empagliflozin], Pneumovax 23 [pneumococcal vac polyvalent], and Wellbutrin [bupropion]   Assessment / Plan:     Visit Diagnoses: Rheumatoid arthritis of multiple sites with negative rheumatoid factor (HCC)-patient continues to have pain and discomfort in multiple joints.  No synovitis was noted on the examination.  She has been taking Enbrel 50 mg subcu weekly without any interruption.  No change in treatment was advised.  High risk medication use - Enbrel 50 mg subcu weekly (inadequate response to peripheral) TB Gold - negative June 24, 2022. -Patient was advised to get TB Gold with her next labs in January.  Labs obtained in October were reviewed.  CBC and CMP were stable with creatinine mildly elevated.  Information on immunization was placed in the AVS.  She was advised to hold Enbrel if she develops an infection and resume after the infection resolves.  Plan: QuantiFERON-TB Gold Plus  Primary osteoarthritis of both hands-she has mild PIP and DIP thickening.  No synovitis was noted.  Trochanteric bursitis, right hip-she is not remittent symptoms.  IT band stretches were discussed.  Primary osteoarthritis of right knee - Severe osteoarthritis and moderate chondromalacia patella was noted on the x-rays January 2022.  No response to Visco.  She is diabetic cannot have cortisone injections.  Patient has been under care of Dr. Jerl Santos.  Patient states she was advised to total knee replacement but she is not prepared for it.  Patient states that her balance is not good and she ambulates with the help of a cane intermittently.  Primary osteoarthritis of  both feet-chronic pain.  Degeneration of intervertebral disc of lumbar region without discogenic back pain or lower extremity pain -she has intermittent discomfort in her lower back.  Multilevel spondylosis and facet joint arthropathy was noted on the x-rays in 2018 done by Dr. Tiburcio Pea.  She was given methocarbamol at the last visit.  Osteopenia of multiple sites - DEXA is followed by her PCP.  Vitamin D deficiency -patient complains of increased fatigue and wants to know her vitamin D level.  Plan: VITAMIN D 25 Hydroxy (Vit-D Deficiency, Fractures)  Other medical problems are listed as follows:  Type 2 diabetes mellitus   Other fatigue - Plan: TSH  Essential hypertension-blood pressure was elevated at 157/76.  She was advised to monitor blood pressure closely and follow-up with her PCP.  Aortic atherosclerosis (HCC)  History of hyperlipidemia  Gastroesophageal reflux disease without esophagitis  History of anxiety  Nocturnal muscle cramps  History of vertigo  History of sleep apnea  Former smoker  Orders: Orders Placed This Encounter  Procedures   QuantiFERON-TB Gold Plus   TSH   VITAMIN  D 25 Hydroxy (Vit-D Deficiency, Fractures)   No orders of the defined types were placed in this encounter.   .  Follow-Up Instructions: Return in about 5 months (around 10/29/2023) for Rheumatoid arthritis, Osteoarthritis.   Pollyann Savoy, MD  Note - This record has been created using Animal nutritionist.  Chart creation errors have been sought, but may not always  have been located. Such creation errors do not reflect on  the standard of medical care.

## 2023-05-31 ENCOUNTER — Ambulatory Visit: Payer: Medicare Other | Attending: Rheumatology | Admitting: Rheumatology

## 2023-05-31 ENCOUNTER — Encounter: Payer: Self-pay | Admitting: Rheumatology

## 2023-05-31 VITALS — BP 157/76 | HR 88 | Resp 17 | Ht 61.0 in | Wt 160.0 lb

## 2023-05-31 DIAGNOSIS — M8589 Other specified disorders of bone density and structure, multiple sites: Secondary | ICD-10-CM

## 2023-05-31 DIAGNOSIS — M19072 Primary osteoarthritis, left ankle and foot: Secondary | ICD-10-CM

## 2023-05-31 DIAGNOSIS — M1711 Unilateral primary osteoarthritis, right knee: Secondary | ICD-10-CM

## 2023-05-31 DIAGNOSIS — E118 Type 2 diabetes mellitus with unspecified complications: Secondary | ICD-10-CM

## 2023-05-31 DIAGNOSIS — Z794 Long term (current) use of insulin: Secondary | ICD-10-CM

## 2023-05-31 DIAGNOSIS — M51369 Other intervertebral disc degeneration, lumbar region without mention of lumbar back pain or lower extremity pain: Secondary | ICD-10-CM | POA: Diagnosis not present

## 2023-05-31 DIAGNOSIS — E559 Vitamin D deficiency, unspecified: Secondary | ICD-10-CM

## 2023-05-31 DIAGNOSIS — M19041 Primary osteoarthritis, right hand: Secondary | ICD-10-CM

## 2023-05-31 DIAGNOSIS — K219 Gastro-esophageal reflux disease without esophagitis: Secondary | ICD-10-CM

## 2023-05-31 DIAGNOSIS — I10 Essential (primary) hypertension: Secondary | ICD-10-CM | POA: Diagnosis not present

## 2023-05-31 DIAGNOSIS — M0609 Rheumatoid arthritis without rheumatoid factor, multiple sites: Secondary | ICD-10-CM | POA: Diagnosis not present

## 2023-05-31 DIAGNOSIS — M19071 Primary osteoarthritis, right ankle and foot: Secondary | ICD-10-CM | POA: Diagnosis not present

## 2023-05-31 DIAGNOSIS — Z8659 Personal history of other mental and behavioral disorders: Secondary | ICD-10-CM

## 2023-05-31 DIAGNOSIS — M7061 Trochanteric bursitis, right hip: Secondary | ICD-10-CM | POA: Diagnosis not present

## 2023-05-31 DIAGNOSIS — R5383 Other fatigue: Secondary | ICD-10-CM

## 2023-05-31 DIAGNOSIS — M19042 Primary osteoarthritis, left hand: Secondary | ICD-10-CM

## 2023-05-31 DIAGNOSIS — Z8669 Personal history of other diseases of the nervous system and sense organs: Secondary | ICD-10-CM

## 2023-05-31 DIAGNOSIS — Z79899 Other long term (current) drug therapy: Secondary | ICD-10-CM

## 2023-05-31 DIAGNOSIS — I700 Atherosclerosis of aorta: Secondary | ICD-10-CM

## 2023-05-31 DIAGNOSIS — Z8639 Personal history of other endocrine, nutritional and metabolic disease: Secondary | ICD-10-CM

## 2023-05-31 DIAGNOSIS — R252 Cramp and spasm: Secondary | ICD-10-CM

## 2023-05-31 DIAGNOSIS — Z87891 Personal history of nicotine dependence: Secondary | ICD-10-CM

## 2023-05-31 DIAGNOSIS — Z87898 Personal history of other specified conditions: Secondary | ICD-10-CM

## 2023-05-31 NOTE — Patient Instructions (Signed)
Standing Labs We placed an order today for your standing lab work.   Please have your standing labs drawn in January and every 3 months TB Gold with next labs  Please have your labs drawn 2 weeks prior to your appointment so that the provider can discuss your lab results at your appointment, if possible.  Please note that you may see your imaging and lab results in MyChart before we have reviewed them. We will contact you once all results are reviewed. Please allow our office up to 72 hours to thoroughly review all of the results before contacting the office for clarification of your results.  WALK-IN LAB HOURS  Monday through Thursday from 8:00 am -12:30 pm and 1:00 pm-5:00 pm and Friday from 8:00 am-12:00 pm.  Patients with office visits requiring labs will be seen before walk-in labs.  You may encounter longer than normal wait times. Please allow additional time. Wait times may be shorter on  Monday and Thursday afternoons.  We do not book appointments for walk-in labs. We appreciate your patience and understanding with our staff.   Labs are drawn by [facility]. Please bring your co-pay at the time of your lab draw.  You may receive a bill from [facility] for your lab work.  Please note if you are on Hydroxychloroquine and and an order has been placed for a Hydroxychloroquine level,  you will need to have it drawn 4 hours or more after your last dose.  If you wish to have your labs drawn at another location, please call the office 24 hours in advance so we can fax the orders.  The office is located at [address], [place], [place] [address]   If you have any questions regarding directions or hours of operation,  please call [phone].   As a reminder, please drink plenty of water prior to coming for your lab work. Thanks!   Vaccines You are taking a medication(s) that can suppress your immune system.  The following immunizations are recommended: Flu annually Covid-19   RSV Td/Tdap (tetanus, diphtheria, pertussis) every 10 years Pneumonia (Prevnar 15 then Pneumovax 23 at least 1 year apart.  Alternatively, can take Prevnar 20 without needing additional dose) Shingrix: 2 doses from 4 weeks to 6 months apart  Please check with your PCP to make sure you are up to date.  If you have signs or symptoms of an infection or start antibiotics: First, call your PCP for workup of your infection. Hold your medication through the infection, until you complete your antibiotics, and until symptoms resolve if you take the following: Injectable medication (Actemra, Benlysta, Cimzia, Cosentyx, Enbrel, Humira, Kevzara, Orencia, Remicade, Simponi, Stelara, Taltz, Tremfya) Methotrexate Leflunomide (Arava) Mycophenolate (Cellcept) Annette Suarez, Annette Suarez, or Annette Suarez

## 2023-06-06 NOTE — Telephone Encounter (Signed)
Received signed patient forms for Annette Suarez renewal for Annette Suarez. Provider portion placed in Annette Suarez folder for signature  Submitted a Prior Authorization request to Annette Suarez for Annette Suarez via CoverMyMeds. Will update once we receive a response.  Key: [id number]   Annette Suarez, PharmD, MPH, BCPS, CPP Clinical Pharmacist (Rheumatology and Pulmonology)

## 2023-06-06 NOTE — Telephone Encounter (Signed)
Received notification from [facility] regarding a prior authorization for ENBREL. Authorization has been APPROVED from 06/06/2023 to 07/04/24. Approval letter sent to scan center. Attached to PAP application  Authorization # : [id number] Phone # [phone]  Chesley Mires, PharmD, MPH, BCPS, CPP Clinical Pharmacist (Rheumatology and Pulmonology)

## 2023-06-09 NOTE — Telephone Encounter (Signed)
Received signed provider form. Submitted Patient Assistance RENEWAL Application to [employer] for ENBREL along with provider portion, patient portion, PA, medication list, insurance card copy and income documents. Will update patient when we receive a response.  Phone #: [phone] Fax #: [phone]

## 2023-06-13 ENCOUNTER — Telehealth: Payer: Self-pay | Admitting: Internal Medicine

## 2023-06-13 MED ORDER — CARVEDILOL 6.25 MG PO TABS: 6.2500 mg | ORAL_TABLET | Freq: Two times a day (BID) | ORAL | 3 refills | Status: DC

## 2023-06-13 NOTE — Telephone Encounter (Signed)
Pt c/o medication issue:  1. Name of Medication: carvedilol (COREG) 6.25 MG tablet   2. How are you currently taking this medication (dosage and times per day)? 2 tablets twice daily   3. Are you having a reaction (difficulty breathing--STAT)? No   4. What is your medication issue? Patient states she takes two tablets twice daily and needs a new prescription sent to the pharmacy. Please advise.

## 2023-06-13 NOTE — Telephone Encounter (Signed)
Called pt to f/u carvedilol.  Pt reports takes 6.25 mg 2 tablets twice daily.  Advised pt per call in on 04/11/23 she c/o fatigue and dose was decreased to 6.25 mg PO BID.  Pt never decreased medication.  Expresses still has fatigue.  Does not have any recent  BP readings.   Advised pt to monitor BP for 1 week to better assist provider in dosing medication.  Pt to call back in 1 week with BP readings. Will send message to provider for dosing of carvedilol.

## 2023-06-13 NOTE — Telephone Encounter (Signed)
Received a fax from  [employer] regarding an approval for ENBREL patient assistance from 07/06/2023 to 07/04/2024. Approval letter sent to scan center.  Phone #: [phone] Fax #: [phone]  Chesley Mires, PharmD, MPH, BCPS, CPP Clinical Pharmacist (Rheumatology and Pulmonology)

## 2023-06-13 NOTE — Telephone Encounter (Signed)
Called pt advised of providers recommendation: Thank you for the update and I agree with your plan to check blood pressures with the decreased dose to see if her fatigue improves.  If her fatigue does not improve we can decrease her carvedilol to 3.125 mg twice daily. Thanks Robin Searing, NP    Refill of carvedilol 6.25 mg PO BID sent to requested pharmacy. Advised pt to call in BP readings in 1-2 weeks.  If BP consistently high prior to 2 weeks pt to call in for further recommendations.

## 2023-06-14 DIAGNOSIS — N3000 Acute cystitis without hematuria: Secondary | ICD-10-CM | POA: Diagnosis not present

## 2023-06-14 DIAGNOSIS — R3121 Asymptomatic microscopic hematuria: Secondary | ICD-10-CM | POA: Diagnosis not present

## 2023-06-14 DIAGNOSIS — R8271 Bacteriuria: Secondary | ICD-10-CM | POA: Diagnosis not present

## 2023-06-17 ENCOUNTER — Other Ambulatory Visit: Payer: Self-pay | Admitting: Internal Medicine

## 2023-06-21 ENCOUNTER — Other Ambulatory Visit: Payer: Self-pay | Admitting: *Deleted

## 2023-06-21 DIAGNOSIS — Z79899 Other long term (current) drug therapy: Secondary | ICD-10-CM

## 2023-06-21 DIAGNOSIS — M0609 Rheumatoid arthritis without rheumatoid factor, multiple sites: Secondary | ICD-10-CM

## 2023-06-21 MED ORDER — ENBREL SURECLICK 50 MG/ML ~~LOC~~ SOAJ
50.0000 mg | SUBCUTANEOUS | 0 refills | Status: DC
Start: 2023-06-21 — End: 2023-10-19

## 2023-06-21 NOTE — Telephone Encounter (Signed)
Refill request received via fax from [facility] for Enbrel  Last Fill: 03/24/2023  Labs: 04/14/2023 CBC is normal.  Creatinine is elevated at 1.2 and glucose is elevated   TB Gold: 06/24/2022 Neg    Next Visit: 10/31/2023  Last Visit: 05/31/2023  DX: Rheumatoid arthritis of multiple sites with negative rheumatoid factor   Current Dose per office note 05/31/2023: Enbrel 50 mg subcu weekly   Patient to update TB Gold with standing labs in January 2025.   Okay to refill Enbrel?

## 2023-06-30 ENCOUNTER — Other Ambulatory Visit: Payer: Self-pay

## 2023-06-30 DIAGNOSIS — G4709 Other insomnia: Secondary | ICD-10-CM

## 2023-06-30 DIAGNOSIS — F419 Anxiety disorder, unspecified: Secondary | ICD-10-CM

## 2023-06-30 MED ORDER — LORAZEPAM 0.5 MG PO TABS
0.5000 mg | ORAL_TABLET | Freq: Every day | ORAL | 5 refills | Status: DC
Start: 2023-06-30 — End: 2024-01-17

## 2023-06-30 NOTE — Telephone Encounter (Signed)
Patient is requesting a refill of the following medications: Requested Prescriptions   Pending Prescriptions Disp Refills   LORazepam (ATIVAN) 0.5 MG tablet 30 tablet 5    Sig: Take 1 tablet (0.5 mg total) by mouth at bedtime.    Date of last refill:05/17/23  Refill amount: 30  Treatment agreement date: April 2024

## 2023-07-18 ENCOUNTER — Encounter: Payer: Self-pay | Admitting: Adult Health

## 2023-07-18 ENCOUNTER — Ambulatory Visit (INDEPENDENT_AMBULATORY_CARE_PROVIDER_SITE_OTHER): Payer: Medicare Other | Admitting: Adult Health

## 2023-07-18 VITALS — BP 135/87 | HR 68 | Temp 97.6°F | Resp 18 | Ht 61.0 in | Wt 156.8 lb

## 2023-07-18 DIAGNOSIS — I152 Hypertension secondary to endocrine disorders: Secondary | ICD-10-CM | POA: Diagnosis not present

## 2023-07-18 DIAGNOSIS — E1159 Type 2 diabetes mellitus with other circulatory complications: Secondary | ICD-10-CM | POA: Diagnosis not present

## 2023-07-18 DIAGNOSIS — E119 Type 2 diabetes mellitus without complications: Secondary | ICD-10-CM | POA: Diagnosis not present

## 2023-07-18 DIAGNOSIS — I10 Essential (primary) hypertension: Secondary | ICD-10-CM

## 2023-07-18 DIAGNOSIS — J40 Bronchitis, not specified as acute or chronic: Secondary | ICD-10-CM

## 2023-07-18 MED ORDER — AZITHROMYCIN 250 MG PO TABS: ORAL_TABLET | ORAL | 0 refills | Status: AC

## 2023-07-18 NOTE — Progress Notes (Signed)
 [facility] clinic  Provider:  Jereld Serum DNP  Code Status:  Full Code  Goals of Care:     05/19/2023   11:09 AM  Advanced Directives  Does Patient Have a Medical Advance Directive? No  Would patient like information on creating a medical advance directive? No - Patient declined     Chief Complaint  Patient presents with   Acute Visit    terrible cough and congestion in upper chest     HPI: Patient is a 83 y.o. female seen today for an acute visit for cough and congestion.   -  3 days ago, started feeling bad, coughing, yellowish phlegm. Has clear nasal drainage -  started taking Mucinex  DM, not able to sleep due to coughing -  no fever nor chills -   no know sick exposure -  hears self wheezing at night -  Had 2 COVID-vaccines -  had negative COVID-19  test today  Hypertension -  BP 135/87, takes Irbesartan , Chlorthalidone  and Carvedilol   Diabetes Mellitus  - blood sugar was 133 this morning, takes Tresiba , Ozempic  and Humalog   Past Medical History:  Diagnosis Date   Acute cystitis    UTI   Anxiety    Arthritis    Benign positional vertigo    Dermatitis    Diabetes mellitus without complication (HCC)    Family history of adverse reaction to anesthesia    GERD (gastroesophageal reflux disease)    Goiter, nontoxic, multinodular    Gout    H/O: hysterectomy    History of tobacco use    Hx laparoscopic cholecystectomy    Hypercholesterolemia    Hypertension    Incomplete bladder emptying    Insomnia    Nausea & vomiting 09/08/2017   Postmenopausal atrophic vaginitis    RA (rheumatoid arthritis) (HCC)    Rectocele    Renal calculus    Skin cancer    Sleep apnea    Thyroid  condition    Type 2 diabetes mellitus (HCC)    Urolithiasis    Vaginal enterocele     Past Surgical History:  Procedure Laterality Date   AORTIC ARCH ANGIOGRAPHY N/A 10/22/2022   Procedure: AORTIC ARCH ANGIOGRAPHY;  Surgeon: Dann Candyce RAMAN, MD;  Location: [facility] INVASIVE  CV LAB;  Service: Cardiovascular;  Laterality: N/A;   CARPAL TUNNEL RELEASE  2018   CATARACT EXTRACTION, BILATERAL     CHOLECYSTECTOMY     COLONOSCOPY     07/11/2009, 02/2015   GALLBLADDER SURGERY     KNEE ARTHROSCOPY Right 05/20/2020   Procedure: RIGHT KNEE ARTHROSCOPY AND DEBRIDEMENT;  Surgeon: Harden Jerona GAILS, MD;  Location: [facility];  Service: Orthopedics;  Laterality: Right;   PARTIAL HYSTERECTOMY     Overies left intact   RIGHT/LEFT HEART CATH AND CORONARY ANGIOGRAPHY N/A 10/22/2022   Procedure: RIGHT/LEFT HEART CATH AND CORONARY ANGIOGRAPHY;  Surgeon: Dann Candyce RAMAN, MD;  Location: [facility] INVASIVE CV LAB;  Service: Cardiovascular;  Laterality: N/A;   SQUAMOUS CELL CARCINOMA EXCISION  05/2018    Allergies  Allergen Reactions   Latex Rash   Amaryl [Glimepiride]     Don't Remember   Amlodipine      LE edema on 7.5mg  daily. Tolerates 5mg  daily just fine   Codeine     SICK   Jardiance [Empagliflozin]     Urinary tract infection- SIDE EFFECT   Pneumovax 23 [Pneumococcal Vac Polyvalent]     Red / Swelling / Hot to touch SIDE EFFECTS   Wellbutrin [  Bupropion]     Unknown    Outpatient Encounter Medications as of 07/18/2023  Medication Sig   Accu-Chek Softclix Lancets lancets Use to test blood sugar three times daily. Dx: E11.8   aspirin  EC 81 MG tablet Take 81 mg by mouth daily.   azithromycin  (ZITHROMAX ) 250 MG tablet Take 2 tablets on day 1, then 1 tablet daily on days 2 through 5   Biotin 1 MG CAPS Take 1 mg by mouth.   carvedilol  (COREG ) 6.25 MG tablet Take 1 tablet (6.25 mg total) by mouth 2 (two) times daily.   chlorthalidone  (HYGROTON ) 25 MG tablet TAKE 1 TABLET BY MOUTH DAILY   Cholecalciferol (VITAMIN D3) 50 MCG (2000 UT) capsule Take 2,000 Units by mouth daily.   clobetasol  cream (TEMOVATE ) 0.05 % Apply 1 Application topically as needed.   Continuous Blood Gluc Receiver (FREESTYLE LIBRE 2 READER) DEVI    Continuous Glucose Sensor (FREESTYLE LIBRE 2  SENSOR) MISC USE TO CHECK BLOOD SUGAR - CHECK EVERY 14 DAYS   CRANBERRY EXTRACT PO Take 650 mg by mouth daily.   Diclofenac Sodium (VOLTAREN EX) Apply 1 application  topically 3 (three) times daily as needed (Arthritis).   estradiol  (ESTRACE ) 0.1 MG/GM vaginal cream Place 1 Applicatorful vaginally 3 (three) times a week.   etanercept  (ENBREL  SURECLICK) 50 MG/ML injection Inject 50 mg into the skin once a week.   fluocinonide  (LIDEX ) 0.05 % external solution Apply 1 Application topically daily as needed.   insulin  degludec (TRESIBA  FLEXTOUCH) 200 UNIT/ML FlexTouch Pen Inject 60 Units into the skin daily in the afternoon.   insulin  lispro (HUMALOG  KWIKPEN) 100 UNIT/ML KwikPen Start CF: Humalog  (BG-130/35) TIDQAC and at bedtime  Max daily 30 units   Insulin  Pen Needle 31G X 5 MM MISC 1 Device by Does not apply route in the morning, at noon, in the evening, and at bedtime.   irbesartan  (AVAPRO ) 300 MG tablet Take 1 tablet (300 mg total) by mouth daily.   loperamide  (IMODIUM  A-D) 2 MG tablet Take 2 mg by mouth daily as needed for diarrhea or loose stools.   LORazepam  (ATIVAN ) 0.5 MG tablet Take 1 tablet (0.5 mg total) by mouth at bedtime.   MAGNESIUM CITRATE PO Take 250 mg by mouth daily as needed (leg cramps).   MELATONIN PO Take 10 mg by mouth at bedtime as needed (Sleep).   methocarbamol  (ROBAXIN ) 500 MG tablet Take 1 tablet (500 mg total) by mouth daily as needed for muscle spasms.   Multiple Vitamins-Minerals (HAIR SKIN & NAILS PO) Take 1 tablet by mouth 2 (two) times daily.   nitroGLYCERIN  (NITROSTAT ) 0.4 MG SL tablet Place 1 tablet (0.4 mg total) under the tongue every 5 (five) minutes as needed for chest pain.   Omeprazole 20 MG TBDD Take 20 mg by mouth daily.   ondansetron  (ZOFRAN ) 4 MG tablet TAKE 1 TABLET BY MOUTH 3 TIMES A DAY AS NEEDED FOR NAUSEA AND/OR VOMITING   Probiotic Product (PROBIOTIC PO) Take 1 tablet by mouth daily.   Propylene Glycol (SYSTANE COMPLETE OP) Place 1-2 drops into  both eyes daily as needed (Dry eye).   Semaglutide ,0.25 or 0.5MG /DOS, (OZEMPIC , 0.25 OR 0.5 MG/DOSE,) 2 MG/1.5ML SOPN Inject 0.5 mg into the skin once a week.   simvastatin  (ZOCOR ) 20 MG tablet TAKE ONE TABLET BY MOUTH EVERY EVENING   No facility-administered encounter medications on file as of 07/18/2023.    Review of Systems:  Review of Systems  Constitutional:  Negative for appetite change, chills,  fatigue and fever.  HENT:  Negative for congestion, hearing loss, rhinorrhea and sore throat.   Eyes: Negative.   Respiratory:  Positive for cough and wheezing. Negative for shortness of breath.   Cardiovascular:  Negative for chest pain, palpitations and leg swelling.  Gastrointestinal:  Negative for abdominal pain, constipation, diarrhea, nausea and vomiting.  Genitourinary:  Negative for dysuria.  Musculoskeletal:  Negative for arthralgias, back pain and myalgias.  Skin:  Negative for color change, rash and wound.  Neurological:  Negative for dizziness, weakness and headaches.  Psychiatric/Behavioral:  Negative for behavioral problems. The patient is not nervous/anxious.     Health Maintenance  Topic Date Due   DTaP/Tdap/Td (3 - Td or Tdap) 05/02/2023   OPHTHALMOLOGY EXAM  07/09/2023   HEMOGLOBIN A1C  09/19/2023   Diabetic kidney evaluation - eGFR measurement  04/13/2024   Diabetic kidney evaluation - Urine ACR  04/13/2024   FOOT EXAM  04/13/2024   Medicare Annual Wellness (AWV)  05/18/2024   Pneumonia Vaccine 9+ Years old  Completed   INFLUENZA VACCINE  Completed   DEXA SCAN  Completed   HPV VACCINES  Aged Out   COVID-19 Vaccine  Discontinued   Hepatitis C Screening  Discontinued   Zoster Vaccines- Shingrix  Discontinued    Physical Exam: Vitals:   07/18/23 1454  BP: 135/87  Pulse: 68  Resp: 18  Temp: 97.6 F (36.4 C)  SpO2: 96%  Weight: 156 lb 12.8 oz (71.1 kg)  Height: 5' 1 (1.549 m)   Body mass index is 29.63 kg/m. Physical Exam Constitutional:       General: She is not in acute distress.    Appearance: Normal appearance.  HENT:     Head: Normocephalic and atraumatic.     Nose: Nose normal.     Mouth/Throat:     Mouth: Mucous membranes are moist.  Eyes:     Conjunctiva/sclera: Conjunctivae normal.  Cardiovascular:     Rate and Rhythm: Normal rate and regular rhythm.  Pulmonary:     Effort: Pulmonary effort is normal.     Breath sounds: Normal breath sounds.  Abdominal:     General: Bowel sounds are normal.     Palpations: Abdomen is soft.  Musculoskeletal:        General: Normal range of motion.     Cervical back: Normal range of motion.  Skin:    General: Skin is warm and dry.  Neurological:     General: No focal deficit present.     Mental Status: She is alert and oriented to person, place, and time.  Psychiatric:        Mood and Affect: Mood normal.        Behavior: Behavior normal.        Thought Content: Thought content normal.        Judgment: Judgment normal.     Labs reviewed: Basic Metabolic Panel: Recent Labs    10/14/22 1238 10/22/22 1111 10/22/22 1121 01/05/23 1044 04/14/23 0944  NA 140   < > 140 140 138  K 4.3   < > 3.9 4.8 4.5  CL 99  --   --  103 101  CO2 27  --   --  30 28  GLUCOSE 156*  --   --  132* 218*  BUN 30*  --   --  23 25  CREATININE 1.17*  --   --  1.03* 1.20*  CALCIUM  9.5  --   --  9.1 9.0   < > =  values in this interval not displayed.   Liver Function Tests: Recent Labs    01/05/23 1044 04/14/23 0944  AST 11 10  ALT 12 10  BILITOT 1.2 0.9  PROT 6.3 6.7   No results for input(s): LIPASE, AMYLASE in the last 8760 hours. No results for input(s): AMMONIA in the last 8760 hours. CBC: Recent Labs    10/14/22 1238 10/22/22 1111 10/22/22 1121 01/05/23 1044 04/14/23 0944  WBC 11.5*  --   --  9.0 10.0  NEUTROABS  --   --   --  6,030 6,900  HGB 13.3   < > 11.6* 11.6* 12.8  HCT 39.2   < > 34.0* 33.6* 38.3  MCV 94  --   --  93.1 94.8  PLT 330  --   --  281 299   <  > = values in this interval not displayed.   Lipid Panel: Recent Labs    12/21/22 0850 04/14/23 0944  CHOL 130 153  HDL 56 38*  LDLCALC 51 86  TRIG 151* 195*  CHOLHDL 2.3 4.0   Lab Results  Component Value Date   HGBA1C 9.2 (A) 03/22/2023    Procedures since last visit: No results found.  Assessment/Plan  1. Bronchitis (Primary) - POC COVID-19 was negative - azithromycin  (ZITHROMAX ) 250 MG tablet; Take 2 tablets on day 1, then 1 tablet daily on days 2 through 5  Dispense: 6 tablet; Refill: 0  2. Hypertension associated with diabetes (HCC) -   BP stable -  continue current medications  3. Diabetes mellitus with coincident hypertension (HCC) Lab Results  Component Value Date   HGBA1C 9.2 (A) 03/22/2023   -   blood sugar this morning 133, stable -   continue Tresiba , Ozempic  and Humalog       Labs/tests ordered:  POC COVID-19 test  Next appt:  10/13/2023 -

## 2023-07-20 ENCOUNTER — Ambulatory Visit: Payer: Medicare Other | Admitting: Internal Medicine

## 2023-07-20 LAB — POC COVID19 BINAXNOW: SARS Coronavirus 2 Ag: NEGATIVE

## 2023-07-21 ENCOUNTER — Encounter: Payer: Self-pay | Admitting: Orthopedic Surgery

## 2023-07-21 ENCOUNTER — Ambulatory Visit: Payer: Medicare Other | Admitting: Orthopedic Surgery

## 2023-07-21 VITALS — BP 148/72 | HR 90 | Temp 96.9°F | Resp 17 | Ht 61.0 in | Wt 158.4 lb

## 2023-07-21 DIAGNOSIS — B379 Candidiasis, unspecified: Secondary | ICD-10-CM

## 2023-07-21 DIAGNOSIS — R062 Wheezing: Secondary | ICD-10-CM

## 2023-07-21 DIAGNOSIS — R0981 Nasal congestion: Secondary | ICD-10-CM

## 2023-07-21 DIAGNOSIS — R058 Other specified cough: Secondary | ICD-10-CM

## 2023-07-21 MED ORDER — ALBUTEROL SULFATE HFA 108 (90 BASE) MCG/ACT IN AERS: 2.0000 | INHALATION_SPRAY | Freq: Four times a day (QID) | RESPIRATORY_TRACT | 0 refills | Status: DC | PRN

## 2023-07-21 MED ORDER — BENZONATATE 200 MG PO CAPS: 200.0000 mg | ORAL_CAPSULE | Freq: Two times a day (BID) | ORAL | 0 refills | Status: DC | PRN

## 2023-07-21 MED ORDER — FLUCONAZOLE 150 MG PO TABS: 150.0000 mg | ORAL_TABLET | Freq: Once | ORAL | 1 refills | Status: AC

## 2023-07-21 MED ORDER — AMOXICILLIN-POT CLAVULANATE 875-125 MG PO TABS: 1.0000 | ORAL_TABLET | Freq: Two times a day (BID) | ORAL | 0 refills | Status: AC

## 2023-07-21 MED ORDER — CETIRIZINE HCL 10 MG PO TABS: 10.0000 mg | ORAL_TABLET | Freq: Every day | ORAL | 11 refills | Status: DC

## 2023-07-21 NOTE — Patient Instructions (Signed)
New antibiotic> augmentin> complete all of it  Tessalon pearls to help with bronchitis cough  Zyrtec to dry out nasal secretions> 1 tablet at bedtime x 14 days  Diflucan for yeast infection> repeat dose in 72 hours if not improved  Albuterol inhaler for wheezing

## 2023-07-21 NOTE — Progress Notes (Signed)
Careteam: Patient Care Team: Octavia Heir, NP as PCP - General (Adult Health Nurse Practitioner) [name], MD as PCP - Cardiology (Cardiology) [name], OD (Optometry) [name], NP as Nurse Practitioner  Seen by: Hazle Nordmann, AGNP-C  PLACE OF SERVICE:  [facility]  Advanced Directive information Does Patient Have a Medical Advance Directive?: Yes, Type of Advance Directive: Healthcare Power of [place];Living will, Does patient want to make changes to medical advance directive?: No - Patient declined  Allergies  Allergen Reactions   Latex Rash   Amaryl [Glimepiride]     Don't Remember   Amlodipine     LE edema on 7.5mg  daily. Tolerates 5mg  daily just fine   Codeine     SICK   Jardiance [Empagliflozin]     Urinary tract infection- SIDE EFFECT   Pneumovax 23 [Pneumococcal Vac Polyvalent]     Red / Swelling / Hot to touch SIDE EFFECTS   Wellbutrin [Bupropion]     Unknown    Chief Complaint  Patient presents with   Follow-up    Patient complains she has medication concerns.      HPI: Patient is a 83 y.o. female seen today for acute visit due to unresolved cough.   01/13 she was prescribed Azithromycin for suspected bronchitis. Covid was negative. She denies improvement to cough after starting antibiotic. She began to feel sick 01/09. Symptoms included productive cough, nasal congestion and ear fullness. Afebrile. She is UTD on flu vaccine. Stopped getting covid boosters > 1 years ago. Today, she reports worsened productive cough. Sputum tan and thick. Nasal congestion involves both nares. Nasal secretions brown. She feels her ears are "plugged" and hearing has decreased. She has also had a few episodes of wheezing in the last 2 days. She is unable to take prednisone due to T2DM. Asking for inhaler today. Recent antibiotic has given her yeast infection. Requesting Diflucan.       Review of Systems:  Review of Systems  Constitutional:  Positive  for malaise/fatigue. Negative for fever.  HENT:  Positive for congestion and sinus pain. Negative for ear discharge, ear pain and sore throat.   Respiratory:  Positive for cough, sputum production, shortness of breath and wheezing.   Cardiovascular:  Negative for chest pain.  Gastrointestinal:  Negative for nausea and vomiting.  Musculoskeletal:  Negative for myalgias.  Neurological:  Negative for dizziness and headaches.  Psychiatric/Behavioral:  Negative for depression. The patient is not nervous/anxious.     Past Medical History:  Diagnosis Date   Acute cystitis    UTI   Anxiety    Arthritis    Benign positional vertigo    Dermatitis    Diabetes mellitus without complication (HCC)    Family history of adverse reaction to anesthesia    GERD (gastroesophageal reflux disease)    Goiter, nontoxic, multinodular    Gout    H/O: hysterectomy    History of tobacco use    Hx laparoscopic cholecystectomy    Hypercholesterolemia    Hypertension    Incomplete bladder emptying    Insomnia    Nausea & vomiting 09/08/2017   Postmenopausal atrophic vaginitis    RA (rheumatoid arthritis) (HCC)    Rectocele    Renal calculus    Skin cancer    Sleep apnea    Thyroid condition    Type 2 diabetes mellitus (HCC)    Urolithiasis    Vaginal enterocele    Past Surgical History:  Procedure Laterality  Date   AORTIC ARCH ANGIOGRAPHY N/A 10/22/2022   Procedure: AORTIC ARCH ANGIOGRAPHY;  Surgeon: Corky Crafts, MD;  Location: [facility] INVASIVE CV LAB;  Service: Cardiovascular;  Laterality: N/A;   CARPAL TUNNEL RELEASE  2018   CATARACT EXTRACTION, BILATERAL     CHOLECYSTECTOMY     COLONOSCOPY     07/11/2009, 02/2015   GALLBLADDER SURGERY     KNEE ARTHROSCOPY Right 05/20/2020   Procedure: RIGHT KNEE ARTHROSCOPY AND DEBRIDEMENT;  Surgeon: Nadara Mustard, MD;  Location: [facility];  Service: Orthopedics;  Laterality: Right;   PARTIAL HYSTERECTOMY     Overies left intact    RIGHT/LEFT HEART CATH AND CORONARY ANGIOGRAPHY N/A 10/22/2022   Procedure: RIGHT/LEFT HEART CATH AND CORONARY ANGIOGRAPHY;  Surgeon: Corky Crafts, MD;  Location: [facility] INVASIVE CV LAB;  Service: Cardiovascular;  Laterality: N/A;   SQUAMOUS CELL CARCINOMA EXCISION  05/2018   Social History:   reports that she quit smoking about 45 years ago. Her smoking use included cigarettes. She started smoking about 55 years ago. She has a 2 pack-year smoking history. She has been exposed to tobacco smoke. She has never used smokeless tobacco. She reports that she does not drink alcohol and does not use drugs.  Family History  Problem Relation Age of Onset   Diabetes Mother    Heart disease Father 64   Diabetes Son    Crohn's disease Son     Medications: Patient's Medications  New Prescriptions   No medications on file  Previous Medications   ACCU-CHEK SOFTCLIX LANCETS LANCETS    Use to test blood sugar three times daily. Dx: E11.8   ASPIRIN EC 81 MG TABLET    Take 81 mg by mouth daily.   AZITHROMYCIN (ZITHROMAX) 250 MG TABLET    Take 2 tablets on day 1, then 1 tablet daily on days 2 through 5   BIOTIN 1 MG CAPS    Take 1 mg by mouth.   CARVEDILOL (COREG) 6.25 MG TABLET    Take 1 tablet (6.25 mg total) by mouth 2 (two) times daily.   CHLORTHALIDONE (HYGROTON) 25 MG TABLET    TAKE 1 TABLET BY MOUTH DAILY   CHOLECALCIFEROL (VITAMIN D3) 50 MCG (2000 UT) CAPSULE    Take 2,000 Units by mouth daily.   CLOBETASOL CREAM (TEMOVATE) 0.05 %    Apply 1 Application topically as needed.   CONTINUOUS BLOOD GLUC RECEIVER (FREESTYLE LIBRE 2 READER) DEVI       CONTINUOUS GLUCOSE SENSOR (FREESTYLE LIBRE 2 SENSOR) MISC    USE TO CHECK BLOOD SUGAR - CHECK EVERY 14 DAYS   CRANBERRY EXTRACT PO    Take 650 mg by mouth daily.   DICLOFENAC SODIUM (VOLTAREN EX)    Apply 1 application  topically 3 (three) times daily as needed (Arthritis).   ESTRADIOL (ESTRACE) 0.1 MG/GM VAGINAL CREAM    Place 1 Applicatorful vaginally 3  (three) times a week.   ETANERCEPT (ENBREL SURECLICK) 50 MG/ML INJECTION    Inject 50 mg into the skin once a week.   FLUOCINONIDE (LIDEX) 0.05 % EXTERNAL SOLUTION    Apply 1 Application topically daily as needed.   INSULIN DEGLUDEC (TRESIBA FLEXTOUCH) 200 UNIT/ML FLEXTOUCH PEN    Inject 60 Units into the skin daily in the afternoon.   INSULIN LISPRO (HUMALOG KWIKPEN) 100 UNIT/ML KWIKPEN    Start CF: Humalog (BG-130/35) TIDQAC and at bedtime  Max daily 30 units   INSULIN PEN NEEDLE 31G X 5 MM  MISC    1 Device by Does not apply route in the morning, at noon, in the evening, and at bedtime.   IRBESARTAN (AVAPRO) 300 MG TABLET    Take 1 tablet (300 mg total) by mouth daily.   LOPERAMIDE (IMODIUM A-D) 2 MG TABLET    Take 2 mg by mouth daily as needed for diarrhea or loose stools.   LORAZEPAM (ATIVAN) 0.5 MG TABLET    Take 1 tablet (0.5 mg total) by mouth at bedtime.   MAGNESIUM CITRATE PO    Take 250 mg by mouth daily as needed (leg cramps).   MELATONIN PO    Take 10 mg by mouth at bedtime as needed (Sleep).   METHOCARBAMOL (ROBAXIN) 500 MG TABLET    Take 1 tablet (500 mg total) by mouth daily as needed for muscle spasms.   MULTIPLE VITAMINS-MINERALS (HAIR SKIN & NAILS PO)    Take 1 tablet by mouth 2 (two) times daily.   NITROGLYCERIN (NITROSTAT) 0.4 MG SL TABLET    Place 1 tablet (0.4 mg total) under the tongue every 5 (five) minutes as needed for chest pain.   OMEPRAZOLE 20 MG TBDD    Take 20 mg by mouth daily.   ONDANSETRON (ZOFRAN) 4 MG TABLET    TAKE 1 TABLET BY MOUTH 3 TIMES A DAY AS NEEDED FOR NAUSEA AND/OR VOMITING   PROBIOTIC PRODUCT (PROBIOTIC PO)    Take 1 tablet by mouth daily.   PROPYLENE GLYCOL (SYSTANE COMPLETE OP)    Place 1-2 drops into both eyes daily as needed (Dry eye).   SEMAGLUTIDE,0.25 OR 0.5MG /DOS, (OZEMPIC, 0.25 OR 0.5 MG/DOSE,) 2 MG/1.5ML SOPN    Inject 0.5 mg into the skin once a week.   SIMVASTATIN (ZOCOR) 20 MG TABLET    TAKE ONE TABLET BY MOUTH EVERY EVENING  Modified  Medications   No medications on file  Discontinued Medications   No medications on file    Physical Exam:  Vitals:   07/21/23 1316  BP: (!) 160/72  Pulse: 90  Resp: 17  Temp: (!) 96.9 F (36.1 C)  SpO2: 99%  Weight: 158 lb 6.4 oz (71.8 kg)  Height: 5\' 1"  (1.549 m)   Body mass index is 29.93 kg/m. Wt Readings from Last 3 Encounters:  07/21/23 158 lb 6.4 oz (71.8 kg)  07/18/23 156 lb 12.8 oz (71.1 kg)  05/31/23 160 lb (72.6 kg)    Physical Exam Vitals reviewed.  Constitutional:      General: She is not in acute distress. HENT:     Head: Normocephalic.     Right Ear: Tympanic membrane normal. There is no impacted cerumen.     Left Ear: Tympanic membrane normal. There is no impacted cerumen.     Nose:     Right Turbinates: Not enlarged or swollen.     Left Turbinates: Not enlarged or swollen.     Right Sinus: Frontal sinus tenderness present.     Left Sinus: Frontal sinus tenderness present.     Mouth/Throat:     Mouth: Mucous membranes are moist.  Eyes:     General:        Right eye: No discharge.        Left eye: No discharge.  Cardiovascular:     Rate and Rhythm: Normal rate and regular rhythm.     Pulses: Normal pulses.     Heart sounds: Normal heart sounds.  Pulmonary:     Effort: Pulmonary effort is normal. No respiratory distress.  Breath sounds: Examination of the right-upper field reveals wheezing and rhonchi. Examination of the left-upper field reveals wheezing and rhonchi. Wheezing and rhonchi present.  Abdominal:     Palpations: Abdomen is soft.  Musculoskeletal:     Right lower leg: No edema.     Left lower leg: No edema.  Lymphadenopathy:     Cervical: No cervical adenopathy.  Skin:    General: Skin is warm.     Capillary Refill: Capillary refill takes less than 2 seconds.  Neurological:     General: No focal deficit present.     Mental Status: She is alert and oriented to person, place, and time.  Psychiatric:        Mood and Affect:  Mood normal.     Labs reviewed: Basic Metabolic Panel: Recent Labs    10/14/22 1238 10/22/22 1111 10/22/22 1121 01/05/23 1044 04/14/23 0944  NA 140   < > 140 140 138  K 4.3   < > 3.9 4.8 4.5  CL 99  --   --  103 101  CO2 27  --   --  30 28  GLUCOSE 156*  --   --  132* 218*  BUN 30*  --   --  23 25  CREATININE 1.17*  --   --  1.03* 1.20*  CALCIUM 9.5  --   --  9.1 9.0   < > = values in this interval not displayed.   Liver Function Tests: Recent Labs    01/05/23 1044 04/14/23 0944  AST 11 10  ALT 12 10  BILITOT 1.2 0.9  PROT 6.3 6.7   No results for input(s): "LIPASE", "AMYLASE" in the last 8760 hours. No results for input(s): "AMMONIA" in the last 8760 hours. CBC: Recent Labs    10/14/22 1238 10/22/22 1111 10/22/22 1121 01/05/23 1044 04/14/23 0944  WBC 11.5*  --   --  9.0 10.0  NEUTROABS  --   --   --  6,030 6,900  HGB 13.3   < > 11.6* 11.6* 12.8  HCT 39.2   < > 34.0* 33.6* 38.3  MCV 94  --   --  93.1 94.8  PLT 330  --   --  281 299   < > = values in this interval not displayed.   Lipid Panel: Recent Labs    12/21/22 0850 04/14/23 0944  CHOL 130 153  HDL 56 38*  LDLCALC 51 86  TRIG 151* 195*  CHOLHDL 2.3 4.0   TSH: No results for input(s): "TSH" in the last 8760 hours. A1C: Lab Results  Component Value Date   HGBA1C 9.2 (A) 03/22/2023     Assessment/Plan 1. Productive cough (Primary) - onset 01/09 - unsuccessful trial azithromycin - suspect URI - will try Augmentin - unable to start prednisone due to T2DM - will try tessalon pearls - amoxicillin-clavulanate (AUGMENTIN) 875-125 MG tablet; Take 1 tablet by mouth 2 (two) times daily for 7 days.  Dispense: 14 tablet; Refill: 0 - benzonatate (TESSALON) 200 MG capsule; Take 1 capsule (200 mg total) by mouth 2 (two) times daily as needed for cough.  Dispense: 20 capsule; Refill: 0  2. Nasal congestion - swollen nasal turbinates and congestion - will start antihistamine to dry out  secretions - cetirizine (ZYRTEC) 10 MG tablet; Take 1 tablet (10 mg total) by mouth daily.  Dispense: 30 tablet; Refill: 11  3. Yeast infection - associated with antibiotic use - fluconazole (DIFLUCAN) 150 MG tablet; Take 1 tablet (  150 mg total) by mouth once for 1 dose. May repeat dose in 72 hours if not resolved.  Dispense: 1 tablet; Refill: 1  4. Wheezing - noted to upper lobes on exam - start rescue inhaler prn - albuterol (VENTOLIN HFA) 108 (90 Base) MCG/ACT inhaler; Inhale 2 puffs into the lungs every 6 (six) hours as needed for wheezing or shortness of breath.  Dispense: 8 g; Refill: 0  Total time: 31 minutes. Greater than 50% of total time spent doing patient education regarding ongoing cough, nasal congestion, wheezing and yeast infection including symptom/medication management.    Next appt: 10/13/2023  Bettina Gavia  [facility] [phone]

## 2023-07-29 ENCOUNTER — Telehealth: Payer: Self-pay | Admitting: Rheumatology

## 2023-07-29 NOTE — Telephone Encounter (Signed)
Spoke with patient and she states she has been sick for 2 weeks with an URI. Patient states she has been on antibiotics. Patient advised she reach out to PCP for clearance that her infection has resolved. Patient expressed understanding.

## 2023-07-29 NOTE — Telephone Encounter (Signed)
Pt called stating she's was fairly sick and they had her on an antibiotic. Pt would like to know she can take her enbrel today. Pt is done with the antibiotic. Finished it yesterday

## 2023-08-12 DIAGNOSIS — R3121 Asymptomatic microscopic hematuria: Secondary | ICD-10-CM | POA: Diagnosis not present

## 2023-08-12 DIAGNOSIS — R8271 Bacteriuria: Secondary | ICD-10-CM | POA: Diagnosis not present

## 2023-08-12 DIAGNOSIS — Z87440 Personal history of urinary (tract) infections: Secondary | ICD-10-CM | POA: Diagnosis not present

## 2023-08-12 DIAGNOSIS — N3000 Acute cystitis without hematuria: Secondary | ICD-10-CM | POA: Diagnosis not present

## 2023-08-12 DIAGNOSIS — L900 Lichen sclerosus et atrophicus: Secondary | ICD-10-CM | POA: Diagnosis not present

## 2023-08-16 ENCOUNTER — Telehealth: Payer: Self-pay | Admitting: *Deleted

## 2023-08-16 NOTE — Telephone Encounter (Signed)
Patient will need to continue to hold enbrel until she has completed the antibiotics and her symptoms have completely resolved.

## 2023-08-16 NOTE — Telephone Encounter (Signed)
Patient is taking antibiotics Cipro for UTI, patient is not taking Enbrel at this time, patient advised not to take Enbrel until she has completed Cipro and all symptoms have resolved. Please advise.

## 2023-08-16 NOTE — Telephone Encounter (Signed)
I called patient, patient verbalized understanding.

## 2023-08-18 ENCOUNTER — Other Ambulatory Visit: Payer: Self-pay

## 2023-08-18 DIAGNOSIS — R5383 Other fatigue: Secondary | ICD-10-CM | POA: Diagnosis not present

## 2023-08-18 DIAGNOSIS — Z79899 Other long term (current) drug therapy: Secondary | ICD-10-CM | POA: Diagnosis not present

## 2023-08-18 DIAGNOSIS — E559 Vitamin D deficiency, unspecified: Secondary | ICD-10-CM

## 2023-08-19 NOTE — Progress Notes (Signed)
Glucose elevated at 198.  Creatinine is elevated and stable.  CBC shows mildly elevated WBC count.  Vitamin D is 40 which is in the desirable range.  TSH is normal.  TB Gold is pending.  Please forward results to her PCP and nephrologist.

## 2023-08-21 LAB — COMPLETE METABOLIC PANEL WITH GFR
AG Ratio: 1.6 (calc) (ref 1.0–2.5)
ALT: 10 U/L (ref 6–29)
AST: 13 U/L (ref 10–35)
Albumin: 4.1 g/dL (ref 3.6–5.1)
Alkaline phosphatase (APISO): 83 U/L (ref 37–153)
BUN/Creatinine Ratio: 20 (calc) (ref 6–22)
BUN: 24 mg/dL (ref 7–25)
CO2: 24 mmol/L (ref 20–32)
Calcium: 9.4 mg/dL (ref 8.6–10.4)
Chloride: 100 mmol/L (ref 98–110)
Creat: 1.18 mg/dL — ABNORMAL HIGH (ref 0.60–0.95)
Globulin: 2.6 g/dL (ref 1.9–3.7)
Glucose, Bld: 198 mg/dL — ABNORMAL HIGH (ref 65–99)
Potassium: 4.4 mmol/L (ref 3.5–5.3)
Sodium: 139 mmol/L (ref 135–146)
Total Bilirubin: 1.2 mg/dL (ref 0.2–1.2)
Total Protein: 6.7 g/dL (ref 6.1–8.1)
eGFR: 47 mL/min/{1.73_m2} — ABNORMAL LOW (ref >=60)

## 2023-08-21 LAB — QUANTIFERON-TB GOLD PLUS
Mitogen-NIL: 5.65 [IU]/mL
NIL: 0.02 [IU]/mL
QuantiFERON-TB Gold Plus: NEGATIVE
TB1-NIL: 0 [IU]/mL
TB2-NIL: 0 [IU]/mL

## 2023-08-21 LAB — CBC WITH DIFFERENTIAL/PLATELET
Absolute Lymphocytes: 2633 {cells}/uL (ref 850–3900)
Absolute Monocytes: 633 {cells}/uL (ref 200–950)
Basophils Absolute: 79 {cells}/uL (ref 0–200)
Basophils Relative: 0.7 %
Eosinophils Absolute: 350 {cells}/uL (ref 15–500)
Eosinophils Relative: 3.1 %
HCT: 36.5 % (ref 35.0–45.0)
Hemoglobin: 12.5 g/dL (ref 11.7–15.5)
MCH: 31.9 pg (ref 27.0–33.0)
MCHC: 34.2 g/dL (ref 32.0–36.0)
MCV: 93.1 fL (ref 80.0–100.0)
MPV: 11.6 fL (ref 7.5–12.5)
Monocytes Relative: 5.6 %
Neutro Abs: 7605 {cells}/uL (ref 1500–7800)
Neutrophils Relative %: 67.3 %
Platelets: 299 10*3/uL (ref 140–400)
RBC: 3.92 10*6/uL (ref 3.80–5.10)
RDW: 11.9 % (ref 11.0–15.0)
Total Lymphocyte: 23.3 %
WBC: 11.3 10*3/uL — ABNORMAL HIGH (ref 3.8–10.8)

## 2023-08-21 LAB — VITAMIN D 25 HYDROXY (VIT D DEFICIENCY, FRACTURES): Vit D, 25-Hydroxy: 40 ng/mL (ref 30–100)

## 2023-08-21 LAB — TSH: TSH: 2.16 m[IU]/L (ref 0.40–4.50)

## 2023-08-22 NOTE — Progress Notes (Signed)
 TB Gold is negative.

## 2023-08-29 ENCOUNTER — Ambulatory Visit: Payer: Medicare Other | Admitting: Internal Medicine

## 2023-08-29 ENCOUNTER — Encounter: Payer: Self-pay | Admitting: Internal Medicine

## 2023-08-29 VITALS — BP 136/78 | HR 84 | Wt 159.0 lb

## 2023-08-29 DIAGNOSIS — E1142 Type 2 diabetes mellitus with diabetic polyneuropathy: Secondary | ICD-10-CM

## 2023-08-29 DIAGNOSIS — Z794 Long term (current) use of insulin: Secondary | ICD-10-CM | POA: Insufficient documentation

## 2023-08-29 DIAGNOSIS — E1165 Type 2 diabetes mellitus with hyperglycemia: Secondary | ICD-10-CM | POA: Diagnosis not present

## 2023-08-29 DIAGNOSIS — E1122 Type 2 diabetes mellitus with diabetic chronic kidney disease: Secondary | ICD-10-CM

## 2023-08-29 DIAGNOSIS — N1831 Chronic kidney disease, stage 3a: Secondary | ICD-10-CM | POA: Diagnosis not present

## 2023-08-29 LAB — POCT GLYCOSYLATED HEMOGLOBIN (HGB A1C): Hemoglobin A1C: 8.7 % — AB (ref 4.0–5.6)

## 2023-08-29 NOTE — Patient Instructions (Addendum)
 Continue Tresiba 60 units ONCE Daily  Continue Ozempic 0.5 mg weekly  Take Humalog 4 units with each meal PLUS add extra if needed depending on the scale  Humalog correctional insulin: Use the scale below to help guide you before each meal and bedtime   Blood sugar before meal Number of units to inject  Less than 165 0 unit  166 -  200 1 units  201 -  235 2 units  236 -  270 3 units  271 -  305 4 units  306 -  340 5 units  341 -  375 6 units  376 -  410 7 units  411 -  445 8 units       HOW TO TREAT LOW BLOOD SUGARS (Blood sugar LESS THAN 70 MG/DL) Please follow the RULE OF 15 for the treatment of hypoglycemia treatment (when your (blood sugars are less than 70 mg/dL)   STEP 1: Take 15 grams of carbohydrates when your blood sugar is low, which includes:  3-4 GLUCOSE TABS  OR 3-4 OZ OF JUICE OR REGULAR SODA OR ONE TUBE OF GLUCOSE GEL    STEP 2: RECHECK blood sugar in 15 MINUTES STEP 3: If your blood sugar is still low at the 15 minute recheck --> then, go back to STEP 1 and treat AGAIN with another 15 grams of carbohydrates.

## 2023-08-29 NOTE — Progress Notes (Signed)
 Name: Annette Suarez  Age/ Sex: 83 y.o., female   MRN/ DOB: [id number], May 17, 1941     PCP: Octavia Heir, NP   Reason for Endocrinology Evaluation: Type 2 Diabetes Mellitus  Initial Endocrine Consultative Visit: 05/11/2021    PATIENT IDENTIFIER: Annette Suarez is a 83 y.o. female with a past medical history of T2DM, HTN, RA and dyslipidemia. The patient has followed with Endocrinology clinic since 05/11/2021 for consultative assistance with management of her diabetes.  DIABETIC HISTORY:  Annette Suarez was diagnosed with DM in ~ 1995 . She is intolerant to Metformin- nausea and Jardiance due to UTI's .Her hemoglobin A1c has ranged from 7.5% in 2021, peaking at 8.6% in 2023.  Transitioned care from Dr. Everardo All 08/2021  Started trulicity in 05/2021  THYROID HISTORY : She has been diagnosed with MNG based on thyroid ultrasound in 2011 showing multiple thyroid nodules. She is s/p benign cytology of the left middle and left inferior nodules in 2011. Ultrasound in 2021 confirmed at least a 5 year stability    SUBJECTIVE:   During the last visit (09/10/2022): A1c 7.8 %     Today (08/29/2023): Annette Suarez is here to establish care for diabetes. She has been checking blood sugars multiple times daily through CGM. The patient has  had hypoglycemic episodes since the last clinic visit.   She continues to follow-up with rheumatology (Dr. Titus Dubin) for seronegative rheumatoid arthritis, and osteoarthritis  She has been on multiple antibiotics for URI   Denies nausea or vomiting  Has noted diarrhea since being on Abx    H/OME DIABETES REGIMEN:  Tresiba 60 units daily  Ozempic 0.5 mg weekly ( Weekly)  CF: Humalog (BG-130/35) TIDQAC and at bedtime     Statin: yes ACE-I/ARB: yes     CONTINUOUS GLUCOSE MONITORING RECORD INTERPRETATION    Dates of Recording:2/11-2/24/2025  Sensor description:freestyle libre  Results statistics:   CGM use % of time 90  Average and SD 249/26.7  Time in  range 19%  % Time Above 180 30  % Time above 250 51  % Time Below target 0     Glycemic patterns summary: BG's trend down overnight and increased throughout the day Hyperglycemic episodes mostly postprandial  Hypoglycemic episodes occurred N/A  Overnight periods: Trends down   DIABETIC COMPLICATIONS: Microvascular complications:  CKD III Denies: retinopathy, neuropathy  Last Eye Exam: Completed 07/2021  Macrovascular complications:   Denies: CAD, CVA, PVD   HISTORY:  Past Medical History:  Past Medical History:  Diagnosis Date   Acute cystitis    UTI   Anxiety    Arthritis    Benign positional vertigo    Dermatitis    Diabetes mellitus without complication (HCC)    Family history of adverse reaction to anesthesia    GERD (gastroesophageal reflux disease)    Goiter, nontoxic, multinodular    Gout    H/O: hysterectomy    History of tobacco use    Hx laparoscopic cholecystectomy    Hypercholesterolemia    Hypertension    Incomplete bladder emptying    Insomnia    Nausea & vomiting 09/08/2017   Postmenopausal atrophic vaginitis    RA (rheumatoid arthritis) (HCC)    Rectocele    Renal calculus    Skin cancer    Sleep apnea    Thyroid condition    Type 2 diabetes mellitus (HCC)    Urolithiasis    Vaginal enterocele    Past Surgical History:  Past Surgical History:  Procedure Laterality Date   AORTIC ARCH ANGIOGRAPHY N/A 10/22/2022   Procedure: AORTIC ARCH ANGIOGRAPHY;  Surgeon: Corky Crafts, MD;  Location[facility] CV LAB;  Service: Cardiovascular;  Laterality: N/A;   CARPAL TUNNEL RELEASE  2018   CATARACT EXTRACTION, BILATERAL     CHOLECYSTECTOMY     COLONOSCOPY     07/11/2009, 02/2015   GALLBLADDER SURGERY     KNEE ARTHROSCOPY Right 05/20/2020   Procedure: RIGHT KNEE ARTHROSCOPY AND DEBRIDEMENT;  Surgeon: Nadara Mustard, MD;  Location: [facility];  Service: Orthopedics;  Laterality: Right;   PARTIAL HYSTERECTOMY     [place]  left intact   RIGHT/LEFT HEART CATH AND CORONARY ANGIOGRAPHY N/A 10/22/2022   Procedure: RIGHT/LEFT HEART CATH AND CORONARY ANGIOGRAPHY;  Surgeon: Corky Crafts, MD;  Location: [facility] INVASIVE CV LAB;  Service: Cardiovascular;  Laterality: N/A;   SQUAMOUS CELL CARCINOMA EXCISION  05/2018   Social History:  reports that she quit smoking about 45 years ago. Her smoking use included cigarettes. She started smoking about 55 years ago. She has a 2 pack-year smoking history. She has been exposed to tobacco smoke. She has never used smokeless tobacco. She reports that she does not drink alcohol and does not use drugs. Family History:  Family History  Problem Relation Age of Onset   Diabetes Mother    Heart disease Father 55   Diabetes Son    Crohn's disease Son      HOME MEDICATIONS: Allergies as of 08/29/2023       Reactions   Latex Rash   Amaryl [glimepiride]    Don't Remember   Amlodipine    LE edema on 7.5mg  daily. Tolerates 5mg  daily just fine   Codeine    SICK   Jardiance [empagliflozin]    Urinary tract infection- SIDE EFFECT   Pneumovax 23 [pneumococcal Vac Polyvalent]    Red / Swelling / Hot to touch SIDE EFFECTS   Wellbutrin [bupropion]    Unknown        Medication List        Accurate as of August 29, 2023  8:18 AM. If you have any questions, ask your nurse or doctor.          Accu-Chek Softclix Lancets lancets Use to test blood sugar three times daily. Dx: E11.8   albuterol 108 (90 Base) MCG/ACT inhaler Commonly known as: VENTOLIN HFA Inhale 2 puffs into the lungs every 6 (six) hours as needed for wheezing or shortness of breath.   aspirin EC 81 MG tablet Take 81 mg by mouth daily.   benzonatate 200 MG capsule Commonly known as: TESSALON Take 1 capsule (200 mg total) by mouth 2 (two) times daily as needed for cough.   Biotin 1 MG Caps Take 1 mg by mouth.   carvedilol 6.25 MG tablet Commonly known as: COREG Take 1 tablet (6.25 mg total) by  mouth 2 (two) times daily.   cetirizine 10 MG tablet Commonly known as: ZYRTEC Take 1 tablet (10 mg total) by mouth daily.   chlorthalidone 25 MG tablet Commonly known as: HYGROTON TAKE 1 TABLET BY MOUTH DAILY   clobetasol cream 0.05 % Commonly known as: TEMOVATE Apply 1 Application topically as needed.   CRANBERRY EXTRACT PO Take 650 mg by mouth daily.   Enbrel SureClick 50 MG/ML injection Generic drug: etanercept Inject 50 mg into the skin once a week.   estradiol 0.1 MG/GM vaginal cream Commonly known as: ESTRACE Place 1 Applicatorful vaginally 3 (  three) times a week.   fluocinonide 0.05 % external solution Commonly known as: LIDEX Apply 1 Application topically daily as needed.   FreeStyle Libre 2 Reader [employer] 2 Sensor Misc USE TO CHECK BLOOD SUGAR - CHECK EVERY 14 DAYS   HAIR SKIN & NAILS PO Take 1 tablet by mouth 2 (two) times daily.   insulin lispro 100 UNIT/ML KwikPen Commonly known as: HumaLOG KwikPen Start CF: Humalog (BG-130/35) TIDQAC and at bedtime  Max daily 30 units   Insulin Pen Needle 31G X 5 MM Misc 1 Device by Does not apply route in the morning, at noon, in the evening, and at bedtime.   irbesartan 300 MG tablet Commonly known as: AVAPRO Take 1 tablet (300 mg total) by mouth daily.   loperamide 2 MG tablet Commonly known as: IMODIUM A-D Take 2 mg by mouth daily as needed for diarrhea or loose stools.   LORazepam 0.5 MG tablet Commonly known as: ATIVAN Take 1 tablet (0.5 mg total) by mouth at bedtime.   MAGNESIUM CITRATE PO Take 250 mg by mouth daily as needed (leg cramps).   MELATONIN PO Take 10 mg by mouth at bedtime as needed (Sleep).   methocarbamol 500 MG tablet Commonly known as: ROBAXIN Take 1 tablet (500 mg total) by mouth daily as needed for muscle spasms.   nitroGLYCERIN 0.4 MG SL tablet Commonly known as: NITROSTAT Place 1 tablet (0.4 mg total) under the tongue every 5 (five) minutes as needed for chest  pain.   omeprazole 20 MG Tbdd disintegrating tablet Take 20 mg by mouth daily.   ondansetron 4 MG tablet Commonly known as: ZOFRAN TAKE 1 TABLET BY MOUTH 3 TIMES A DAY AS NEEDED FOR NAUSEA AND/OR VOMITING   Ozempic (0.25 or 0.5 MG/DOSE) 2 MG/1.5ML Sopn Generic drug: Semaglutide(0.25 or 0.5MG /DOS) Inject 0.5 mg into the skin once a week.   PROBIOTIC PO Take 1 tablet by mouth daily.   simvastatin 20 MG tablet Commonly known as: ZOCOR TAKE ONE TABLET BY MOUTH EVERY EVENING   SYSTANE COMPLETE OP Place 1-2 drops into both eyes daily as needed (Dry eye).   Evaristo Bury FlexTouch 200 UNIT/ML FlexTouch Pen Generic drug: insulin degludec Inject 60 Units into the skin daily in the afternoon.   Vitamin D3 50 MCG (2000 UT) capsule Take 2,000 Units by mouth daily.   VOLTAREN EX Apply 1 application  topically 3 (three) times daily as needed (Arthritis).         OBJECTIVE:   Vital Signs: BP 136/78 (BP Location: Right Arm, Patient Position: Sitting, Cuff Size: Normal)   Pulse 84   Wt 159 lb (72.1 kg)   SpO2 99%   BMI 30.04 kg/m   Wt Readings from Last 3 Encounters:  08/29/23 159 lb (72.1 kg)  07/21/23 158 lb 6.4 oz (71.8 kg)  07/18/23 156 lb 12.8 oz (71.1 kg)     Exam: General: Pt appears well and is in NAD  Neck: General: Supple without adenopathy. Thyroid: Thyroid is prominent on the right  Lungs: Clear with good BS bilat   Heart: RRR with norm   Extremities: No pretibial edema.  Neuro: MS is good with appropriate affect, pt is alert and Ox3   DM Foot Exam 08/29/2023  The skin of the feet is intact without sores or ulcerations. The pedal pulses are 2+ on right and 2+ on left. The sensation is decreased  to a screening 5.07, 10 gram monofilament bilaterally    DATA REVIEWED:  Lab Results  Component Value Date   HGBA1C 8.7 (A) 08/29/2023   HGBA1C 9.2 (A) 03/22/2023   HGBA1C 7.8 (A) 09/10/2022    Latest Reference Range & Units 08/18/23 13:43  Sodium 135 - 146  mmol/L 139  Potassium 3.5 - 5.3 mmol/L 4.4  Chloride 98 - 110 mmol/L 100  CO2 20 - 32 mmol/L 24  Glucose 65 - 99 mg/dL 846 (H)  BUN 7 - 25 mg/dL 24  Creatinine 9.62 - 9.52 mg/dL 8.41 (H)  Calcium 8.6 - 10.4 mg/dL 9.4  BUN/Creatinine Ratio 6 - 22 (calc) 20  eGFR > OR = 60 mL/min/1.55m2 47 (L)  AG Ratio 1.0 - 2.5 (calc) 1.6  AST 10 - 35 U/L 13  ALT 6 - 29 U/L 10  Total Protein 6.1 - 8.1 g/dL 6.7  Total Bilirubin 0.2 - 1.2 mg/dL 1.2     Latest Reference Range & Units 08/18/23 13:43  TSH 0.40 - 4.50 mIU/L 2.16       ASSESSMENT / PLAN / RECOMMENDATIONS:   1) Type 2 Diabetes Mellitus, Poorly  controlled, With CKD III and neuropathic complications - Most recent A1c of 8.7 %. Goal A1c < 7.5 %.    -A1c down from  9.2% to 8.7% - She is on pt assistance for Ozempic - Limited exercise due to knee pains - She is intolerant to Jardiance  -I will add a standing dose of Humalog to take before each meal -I explained to the patient that way you to use correction scale in addition to the 4 units of Humalog with each meal, she may use correction scale only for bedtime   MEDICATIONS: Continue  Ozempic 0.5 mg weekly(samples) Continue Tresiba 60 units daily Start Humalog 4 units with each meal  Continue CF: Humalog (BG-130/35) TIDQAC and QHS  EDUCATION / INSTRUCTIONS: BG monitoring instructions: Patient is instructed to check her blood sugars 2 times a day, fasting and suppertime. Call [facility] if: BG persistently < 70  I reviewed the Rule of 15 for the treatment of hypoglycemia in detail with the patient. Literature supplied.   2) Diabetic complications:  Eye: Does not have known diabetic retinopathy.  Neuro/ Feet: Does have known diabetic peripheral neuropathy .  Renal: Patient does  have known baseline CKD. She   is  on an ACEI/ARB at present.    3) Dyslipidemia :   -  LDL  has been at goal , no change    Medication  Simvastatin 20 mg daily     F/U in  6 months         Signed electronically by: Lyndle Herrlich, MD  [facility]  [facility] [address] [place] [place], [place] [address] Phone: [phone] FAX: [phone]   CC: Octavia Heir, NP [address] [place] [address] Phone: [phone]  Fax: [phone]  Return to Endocrinology clinic as below: Future Appointments  Date Time Provider Department Center  10/13/2023  9:00 AM Octavia Heir, NP [facility] None  10/31/2023 10:10 AM Gearldine Bienenstock, PA-C [facility] None  05/24/2024 10:40 AM Octavia Heir, NP [facility] None

## 2023-08-30 ENCOUNTER — Encounter: Payer: Self-pay | Admitting: Internal Medicine

## 2023-08-30 DIAGNOSIS — Z1231 Encounter for screening mammogram for malignant neoplasm of breast: Secondary | ICD-10-CM | POA: Diagnosis not present

## 2023-09-02 DIAGNOSIS — K5730 Diverticulosis of large intestine without perforation or abscess without bleeding: Secondary | ICD-10-CM | POA: Diagnosis not present

## 2023-09-02 DIAGNOSIS — R3129 Other microscopic hematuria: Secondary | ICD-10-CM | POA: Diagnosis not present

## 2023-09-02 DIAGNOSIS — R3121 Asymptomatic microscopic hematuria: Secondary | ICD-10-CM | POA: Diagnosis not present

## 2023-09-02 DIAGNOSIS — K5750 Diverticulosis of both small and large intestine without perforation or abscess without bleeding: Secondary | ICD-10-CM | POA: Diagnosis not present

## 2023-09-02 DIAGNOSIS — K4090 Unilateral inguinal hernia, without obstruction or gangrene, not specified as recurrent: Secondary | ICD-10-CM | POA: Diagnosis not present

## 2023-09-02 DIAGNOSIS — N281 Cyst of kidney, acquired: Secondary | ICD-10-CM | POA: Diagnosis not present

## 2023-09-02 DIAGNOSIS — N3090 Cystitis, unspecified without hematuria: Secondary | ICD-10-CM | POA: Diagnosis not present

## 2023-09-15 DIAGNOSIS — N3021 Other chronic cystitis with hematuria: Secondary | ICD-10-CM | POA: Diagnosis not present

## 2023-09-19 DIAGNOSIS — H43393 Other vitreous opacities, bilateral: Secondary | ICD-10-CM | POA: Diagnosis not present

## 2023-09-19 DIAGNOSIS — H04123 Dry eye syndrome of bilateral lacrimal glands: Secondary | ICD-10-CM | POA: Diagnosis not present

## 2023-09-19 DIAGNOSIS — H35363 Drusen (degenerative) of macula, bilateral: Secondary | ICD-10-CM | POA: Diagnosis not present

## 2023-09-19 DIAGNOSIS — E119 Type 2 diabetes mellitus without complications: Secondary | ICD-10-CM | POA: Diagnosis not present

## 2023-09-19 DIAGNOSIS — H43813 Vitreous degeneration, bilateral: Secondary | ICD-10-CM | POA: Diagnosis not present

## 2023-09-19 LAB — HM DIABETES EYE EXAM

## 2023-09-23 DIAGNOSIS — M1711 Unilateral primary osteoarthritis, right knee: Secondary | ICD-10-CM | POA: Diagnosis not present

## 2023-09-25 ENCOUNTER — Other Ambulatory Visit: Payer: Self-pay | Admitting: Orthopedic Surgery

## 2023-10-04 ENCOUNTER — Telehealth: Payer: Self-pay | Admitting: Internal Medicine

## 2023-10-04 ENCOUNTER — Ambulatory Visit: Attending: Cardiology | Admitting: Cardiology

## 2023-10-04 ENCOUNTER — Encounter: Payer: Self-pay | Admitting: Cardiology

## 2023-10-04 ENCOUNTER — Telehealth: Payer: Self-pay

## 2023-10-04 DIAGNOSIS — R42 Dizziness and giddiness: Secondary | ICD-10-CM

## 2023-10-04 DIAGNOSIS — I10 Essential (primary) hypertension: Secondary | ICD-10-CM

## 2023-10-04 DIAGNOSIS — I771 Stricture of artery: Secondary | ICD-10-CM | POA: Diagnosis not present

## 2023-10-04 NOTE — Telephone Encounter (Signed)
 STAT if patient feels like he/she is going to faint   1. Are you feeling dizzy, lightheaded, or faint right now? Yes she is lightheaded at this times    2. Have you passed out?  no (If yes move to .[facility])   3. Do you have any other symptoms? no  4. Have you checked your HR and BP (record if available)? Blood pressure was high today, it was 158/77. Patient wants to be seen- she only wants to see Dr Izora Ribas

## 2023-10-04 NOTE — Patient Instructions (Signed)
 Medication Instructions:  Continue same medications *If you need a refill on your cardiac medications before your next appointment, please call your pharmacy*  Lab Work: None ordered  Testing/Procedures: Carotid dopplers  Upper Extremity Arterial dopplers    Follow-Up: At [facility], you and your health needs are our priority.  As part of our continuing mission to provide you with exceptional heart care, our providers are all part of one team.  This team includes your primary Cardiologist (physician) and Advanced Practice Providers or APPs (Physician Assistants and Nurse Practitioners) who all work together to provide you with the care you need, when you need it.  Your next appointment:  After Dopplers    Provider:  Dr.Chandrasekhar   We recommend signing up for the patient portal called "MyChart".  Sign up information is provided on this After Visit Summary.  MyChart is used to connect with patients for Virtual Visits (Telemedicine).  Patients are able to view lab/test results, encounter notes, upcoming appointments, etc.  Non-urgent messages can be sent to your provider as well.   To learn more about what you can do with MyChart, go to [email or web address].         1st Floor: - Lobby - Registration  - Pharmacy  - Lab - Cafe  2nd Floor: - PV Lab - Diagnostic Testing (echo, CT, nuclear med)  3rd Floor: - Vacant  4th Floor: - TCTS (cardiothoracic surgery) - AFib Clinic - Structural Heart Clinic - Vascular Surgery  - Vascular Ultrasound  5th Floor: - HeartCare Cardiology (general and EP) - Clinical Pharmacy for coumadin, hypertension, lipid, weight-loss medications, and med management appointments    Valet parking services will be available as well.

## 2023-10-04 NOTE — Telephone Encounter (Signed)
 Spoke with pt over the phone and pt stated she is still feeling slightly dizzy. Pt was able to get in with [facility] Dr. [place] today at 2 PM. Message was sent to his covering to let them know.

## 2023-10-04 NOTE — Telephone Encounter (Signed)
 Spoke to patient's husband Dr.Chandrasekhar advised patient will need to see Dr.Arida after dopplers.Appointment scheduled with Dr.Arida 4/29 at 11:20 am at new office.

## 2023-10-04 NOTE — Progress Notes (Signed)
  Cardiology Office Note:  .   Date:  10/04/2023  ID:  Annette Suarez, DOB [birth year], MRN [id number] PCP: Annette Heir, NP  [facility] Providers Cardiologist:  [name], MD    History of Present Illness: .   Annette Suarez is a 83 y.o. female seen as a work in for evaluation of dizziness and elevated BP. She is a patient of Dr Annette Suarez. She has a history of HTN, HLD, nonobstructive CAD and left subclavian stenosis (managed medically).   She reports that for the past week she has been lightheaded especially when she bends forward and straightens up. No vertigo but some nausea. Feels wobbly and uses a cane. Sugars are up and down. BP this am was 155/77 but she state it is usually in the 130s. She does have some tingling in her left hand and sometimes drops things but doesn't notice much weakness. No left arm pain.   ROS: as noted in HPI  Studies Reviewed: Marland Kitchen   EKG Interpretation Date/Time:  Tuesday October 04 2023 14:01:51 EDT Ventricular Rate:  84 PR Interval:  172 QRS Duration:  70 QT Interval:  376 QTC Calculation: 444 R Axis:   64  Text Interpretation: Sinus rhythm with Premature atrial complexes Nonspecific ST abnormality When compared with ECG of October 14, 2022 Premature atrial complexes are now Present Confirmed by [place], Quasim Doyon ([id number]) on 10/04/2023 2:04:28 PM    EKG Interpretation Date/Time:  Tuesday October 04 2023 14:01:51 EDT Ventricular Rate:  84 PR Interval:  172 QRS Duration:  70 QT Interval:  376 QTC Calculation: 444 R Axis:   64  Text Interpretation: Sinus rhythm with Premature atrial complexes Nonspecific ST abnormality When compared with ECG of October 14, 2022 Premature atrial complexes are now Present Confirmed by [place], Nation Cradle [id number]) on 10/04/2023 2:04:28 PM   Risk Assessment/Calculations:     Orthostatic vitals in right arm Supine BP 179/78, pulse 94 Sitting 163/52, 87 Standing 147/76, 94 3 minutes standing 157/76, 95  Physical Exam:    VS:  SpO2 97%    Wt Readings from Last 3 Encounters:  08/29/23 159 lb (72.1 kg)  07/21/23 158 lb 6.4 oz (71.8 kg)  07/18/23 156 lb 12.8 oz (71.1 kg)    GEN: Well nourished, well developed in no acute distress NECK: No JVD; bilateral carotid bruits.  CARDIAC: RRR, no murmurs, rubs, gallops RESPIRATORY:  Clear to auscultation without rales, wheezing or rhonchi  ABDOMEN: Soft, non-tender, non-distended EXTREMITIES:  No edema; poor pulse left arm  ASSESSMENT AND PLAN: .     Dizziness. She does have some orthostasis but BP is high. I am concerned this could represent more subclavian steal. Labs in Feb were stable. Will update carotid and UE dopplers. Will hold off on changing BP meds at this point but continue to monitor at home. Ecg is stable. Will arrange follow up with Dr Annette Suarez afterwards.       Signed, Vela Render [place], MD

## 2023-10-06 ENCOUNTER — Other Ambulatory Visit: Payer: Self-pay | Admitting: Orthopedic Surgery

## 2023-10-06 DIAGNOSIS — E118 Type 2 diabetes mellitus with unspecified complications: Secondary | ICD-10-CM

## 2023-10-13 ENCOUNTER — Encounter: Payer: Medicare Other | Admitting: Orthopedic Surgery

## 2023-10-13 NOTE — Progress Notes (Deleted)
 Careteam: Patient Care Team: Octavia Heir, NP as PCP - General (Adult Health Nurse Practitioner) [name], MD as PCP - Cardiology (Cardiology) [name], OD (Optometry) [name], NP as Nurse Practitioner  Seen by: Hazle Nordmann, AGNP-C  PLACE OF SERVICE:  [facility]  Advanced Directive information    Allergies  Allergen Reactions   Latex Rash   Amaryl [Glimepiride]     Don't Remember   Amlodipine     LE edema on 7.5mg  daily. Tolerates 5mg  daily just fine   Codeine     SICK   Jardiance [Empagliflozin]     Urinary tract infection- SIDE EFFECT   Pneumovax 23 [Pneumococcal Vac Polyvalent]     Red / Swelling / Hot to touch SIDE EFFECTS   Wellbutrin [Bupropion]     Unknown    Chief Complaint  Patient presents with   Medical Management of Chronic Issues    6 month follow up. Discuss the need for DTAP vaccine.      HPI: Patient is a 83 y.o. female seen today for medical management of chronic conditions.     Review of Systems:  ROS***  Past Medical History:  Diagnosis Date   Acute cystitis    UTI   Anxiety    Arthritis    Benign positional vertigo    Dermatitis    Diabetes mellitus without complication (HCC)    Family history of adverse reaction to anesthesia    GERD (gastroesophageal reflux disease)    Goiter, nontoxic, multinodular    Gout    H/O: hysterectomy    History of tobacco use    Hx laparoscopic cholecystectomy    Hypercholesterolemia    Hypertension    Incomplete bladder emptying    Insomnia    Nausea & vomiting 09/08/2017   Postmenopausal atrophic vaginitis    RA (rheumatoid arthritis) (HCC)    Rectocele    Renal calculus    Skin cancer    Sleep apnea    Thyroid condition    Type 2 diabetes mellitus (HCC)    Urolithiasis    Vaginal enterocele    Past Surgical History:  Procedure Laterality Date   AORTIC ARCH ANGIOGRAPHY N/A 10/22/2022   Procedure: AORTIC ARCH ANGIOGRAPHY;  Surgeon: Corky Crafts,  MD;  Location: [facility];  Service: Cardiovascular;  Laterality: N/A;   CARPAL TUNNEL RELEASE  2018   CATARACT EXTRACTION, BILATERAL     CHOLECYSTECTOMY     COLONOSCOPY     07/11/2009, 02/2015   GALLBLADDER SURGERY     KNEE ARTHROSCOPY Right 05/20/2020   Procedure: RIGHT KNEE ARTHROSCOPY AND DEBRIDEMENT;  Surgeon: Nadara Mustard, MD;  Location: [facility];  Service: Orthopedics;  Laterality: Right;   PARTIAL HYSTERECTOMY     Overies left intact   RIGHT/LEFT HEART CATH AND CORONARY ANGIOGRAPHY N/A 10/22/2022   Procedure: RIGHT/LEFT HEART CATH AND CORONARY ANGIOGRAPHY;  Surgeon: Corky Crafts, MD;  Location: [facility];  Service: Cardiovascular;  Laterality: N/A;   SQUAMOUS CELL CARCINOMA EXCISION  05/2018   Social History:   reports that she quit smoking about 45 years ago. Her smoking use included cigarettes. She started smoking about 55 years ago. She has a 2 pack-year smoking history. She has been exposed to tobacco smoke. She has never used smokeless tobacco. She reports that she does not drink alcohol and does not use drugs.  Family History  Problem Relation Age of Onset   Diabetes  Mother    Heart disease Father 35   Diabetes Son    Crohn's disease Son     Medications: Patient's Medications  New Prescriptions   No medications on file  Previous Medications   ACCU-CHEK SOFTCLIX LANCETS LANCETS    Use to test blood sugar three times daily. Dx: E11.8   ASPIRIN EC 81 MG TABLET    Take 81 mg by mouth daily.   BIOTIN 1 MG CAPS    Take 1 mg by mouth.   CARVEDILOL (COREG) 6.25 MG TABLET    Take 1 tablet (6.25 mg total) by mouth 2 (two) times daily.   CETIRIZINE (ZYRTEC) 10 MG TABLET    Take 1 tablet (10 mg total) by mouth daily.   CHLORTHALIDONE (HYGROTON) 25 MG TABLET    TAKE 1 TABLET BY MOUTH DAILY   CHOLECALCIFEROL (VITAMIN D3) 50 MCG (2000 UT) CAPSULE    Take 2,000 Units by mouth daily.   CLOBETASOL CREAM (TEMOVATE) 0.05 %    Apply 1 Application  topically as needed.   CONTINUOUS BLOOD GLUC RECEIVER (FREESTYLE LIBRE 2 READER) DEVI       CONTINUOUS GLUCOSE SENSOR (FREESTYLE LIBRE 2 SENSOR) MISC    APPLY SENSOR TO SKIN FOR CONTINUOUS BLOOD SUGAR MONITORING, REPLACE EVERY 14 DAYS   DICLOFENAC SODIUM (VOLTAREN EX)    Apply 1 application  topically 3 (three) times daily as needed (Arthritis).   ESTRADIOL (ESTRACE) 0.1 MG/GM VAGINAL CREAM    Place 1 Applicatorful vaginally 3 (three) times a week.   ETANERCEPT (ENBREL SURECLICK) 50 MG/ML INJECTION    Inject 50 mg into the skin once a week.   FLUOCINONIDE (LIDEX) 0.05 % EXTERNAL SOLUTION    Apply 1 Application topically daily as needed.   INSULIN DEGLUDEC (TRESIBA FLEXTOUCH) 200 UNIT/ML FLEXTOUCH PEN    Inject 60 Units into the skin daily in the afternoon.   INSULIN LISPRO (HUMALOG KWIKPEN) 100 UNIT/ML KWIKPEN    Start CF: Humalog (BG-130/35) TIDQAC and at bedtime  Max daily 30 units   INSULIN PEN NEEDLE 31G X 5 MM MISC    1 Device by Does not apply route in the morning, at noon, in the evening, and at bedtime.   IRBESARTAN (AVAPRO) 300 MG TABLET    Take 1 tablet (300 mg total) by mouth daily.   LOPERAMIDE (IMODIUM A-D) 2 MG TABLET    Take 2 mg by mouth daily as needed for diarrhea or loose stools.   LORAZEPAM (ATIVAN) 0.5 MG TABLET    Take 1 tablet (0.5 mg total) by mouth at bedtime.   MAGNESIUM CITRATE PO    Take 250 mg by mouth daily as needed (leg cramps).   MELATONIN PO    Take 10 mg by mouth at bedtime as needed (Sleep).   NITROGLYCERIN (NITROSTAT) 0.4 MG SL TABLET    Place 1 tablet (0.4 mg total) under the tongue every 5 (five) minutes as needed for chest pain.   OMEPRAZOLE 20 MG TBDD    Take 20 mg by mouth daily.   ONDANSETRON (ZOFRAN) 4 MG TABLET    TAKE 1 TABLET BY MOUTH 3 TIMES A DAY AS NEEDED FOR NAUSEA AND/OR VOMITING   PROBIOTIC PRODUCT (PROBIOTIC PO)    Take 1 tablet by mouth daily.   PROPYLENE GLYCOL (SYSTANE COMPLETE OP)    Place 1-2 drops into both eyes daily as needed (Dry eye).    SEMAGLUTIDE,0.25 OR 0.5MG /DOS, (OZEMPIC, 0.25 OR 0.5 MG/DOSE,) 2 MG/1.5ML SOPN    Inject 0.5  mg into the skin once a week.   SIMVASTATIN (ZOCOR) 20 MG TABLET    TAKE ONE TABLET BY MOUTH EVERY EVENING  Modified Medications   No medications on file  Discontinued Medications   No medications on file    Physical Exam:  There were no vitals filed for this visit. There is no height or weight on file to calculate BMI. Wt Readings from Last 3 Encounters:  08/29/23 159 lb (72.1 kg)  07/21/23 158 lb 6.4 oz (71.8 kg)  07/18/23 156 lb 12.8 oz (71.1 kg)    Physical Exam***  Labs reviewed: Basic Metabolic Panel: Recent Labs    01/05/23 1044 04/14/23 0944 08/18/23 1343  NA 140 138 139  K 4.8 4.5 4.4  CL 103 101 100  CO2 30 28 24   GLUCOSE 132* 218* 198*  BUN 23 25 24   CREATININE 1.03* 1.20* 1.18*  CALCIUM 9.1 9.0 9.4  TSH  --   --  2.16   Liver Function Tests: Recent Labs    01/05/23 1044 04/14/23 0944 08/18/23 1343  AST 11 10 13   ALT 12 10 10   BILITOT 1.2 0.9 1.2  PROT 6.3 6.7 6.7   No results for input(s): "LIPASE", "AMYLASE" in the last 8760 hours. No results for input(s): "AMMONIA" in the last 8760 hours. CBC: Recent Labs    01/05/23 1044 04/14/23 0944 08/18/23 1343  WBC 9.0 10.0 11.3*  NEUTROABS 6,030 6,900 7,605  HGB 11.6* 12.8 12.5  HCT 33.6* 38.3 36.5  MCV 93.1 94.8 93.1  PLT 281 299 299   Lipid Panel: Recent Labs    12/21/22 0850 04/14/23 0944  CHOL 130 153  HDL 56 38*  LDLCALC 51 86  TRIG 151* 195*  CHOLHDL 2.3 4.0   TSH: Recent Labs    08/18/23 1343  TSH 2.16   A1C: Lab Results  Component Value Date   HGBA1C 8.7 (A) 08/29/2023     Assessment/Plan 1. Type 2 diabetes mellitus with hyperglycemia, with long-term current use of insulin (HCC) (Primary) ***  2. Hypertension associated with diabetes (HCC) ***  3. Hyperlipidemia associated with type 2 diabetes mellitus (HCC) ***   Next appt: *** Bettina Gavia  [facility] [phone]

## 2023-10-14 NOTE — Progress Notes (Signed)
 This encounter was created in error - please disregard.

## 2023-10-17 NOTE — Progress Notes (Signed)
 Office Visit Note  Patient: Annette Suarez             Date of Birth: 09-09-1940           MRN: [id number]             PCP: Arnetha Bhat, NP Referring: Arnetha Bhat, NP Visit Date: 10/31/2023 Occupation: @GUAROCC @  Subjective:  Morning stiffness   History of Present Illness: Annette Suarez is a 83 y.o. female with history of seronegative rheumatoid arthritis and ostoarthritis.  Patient remains on Enbrel  50 mg sq injections once weekly.  She is tolerating Enbrel  without any side effects and has not had any recent gaps in therapy.  Her most recent dose of Enbrel  was administered yesterday.  Patient states that she has been experiencing increased morning stiffness in both hands lasting for about 1 hour daily.  She is also had increased pain in the right thumb and discomfort in the cervical spine.  Her symptoms are alleviated as the day goes on.  She takes Tylenol  if needed for moderate to severe pain relief.  She also alternates ice and heat which has been helpful.    Activities of Daily Living:  Patient reports morning stiffness for 1 hour.   Patient Denies nocturnal pain.  Difficulty dressing/grooming: Denies Difficulty climbing stairs: Denies Difficulty getting out of chair: Reports Difficulty using hands for taps, buttons, cutlery, and/or writing: Reports  Review of Systems  Constitutional:  Positive for fatigue.  HENT:  Positive for mouth dryness. Negative for mouth sores.   Eyes:  Positive for dryness.  Cardiovascular:  Negative for chest pain and palpitations.  Gastrointestinal:  Negative for blood in stool, constipation and diarrhea.  Endocrine: Negative for increased urination.  Genitourinary:  Negative for involuntary urination.  Musculoskeletal:  Positive for joint pain, gait problem, joint pain, joint swelling, muscle weakness and morning stiffness. Negative for myalgias, muscle tenderness and myalgias.  Skin:  Negative for color change, rash, hair loss and sensitivity to  sunlight.  Allergic/Immunologic: Negative for susceptible to infections.  Neurological:  Negative for dizziness and headaches.  Hematological:  Negative for swollen glands.  Psychiatric/Behavioral:  Positive for depressed mood and sleep disturbance. The patient is nervous/anxious.     PMFS History:  Patient Active Problem List   Diagnosis Date Noted   Type 2 diabetes mellitus with diabetic polyneuropathy, with long-term current use of insulin  (HCC) 08/29/2023   Type 2 diabetes mellitus with hyperglycemia, with long-term current use of insulin  (HCC) 08/29/2023   Coronary artery disease of native artery of native heart with stable angina pectoris (HCC) 01/27/2022   Hyperlipidemia associated with type 2 diabetes mellitus (HCC) 01/27/2022   Diabetes mellitus with coincident hypertension (HCC) 08/13/2021   Type 2 diabetes mellitus with stage 3a chronic kidney disease, with long-term current use of insulin  (HCC) 08/13/2021   Multinodular goiter 08/13/2021   Subclavian arterial stenosis (HCC) 09/11/2020   Aortic atherosclerosis (HCC) 09/11/2020   Alteration in blood pressure 06/11/2020   Old complex tear of medial meniscus of right knee    Old complex tear of lateral meniscus of right knee    Primary osteoarthritis of both feet 12/13/2017   Dehydration 09/09/2017   Acute lower UTI 09/09/2017   Nausea & vomiting 09/08/2017   Primary osteoarthritis of both hands 04/05/2017   Left carpal tunnel syndrome 11/15/2016   Rheumatoid arthritis of multiple sites with negative rheumatoid factor (HCC) 10/06/2016   High risk medication use 10/06/2016   Dyslipidemia 10/06/2016  Pain in both hands 09/08/2016   Hypertension associated with diabetes (HCC) 09/08/2016   Type 2 diabetes mellitus  09/08/2016   Gastroesophageal reflux disease without esophagitis 09/08/2016   Osteopenia of multiple sites 09/08/2016   History of anxiety 09/08/2016   Benign paroxysmal positional vertigo 09/08/2016   Sleep  apnea 09/08/2016    Past Medical History:  Diagnosis Date   Acute cystitis    UTI   Anxiety    Arthritis    Benign positional vertigo    Dermatitis    Diabetes mellitus without complication (HCC)    Family history of adverse reaction to anesthesia    GERD (gastroesophageal reflux disease)    Goiter, nontoxic, multinodular    Gout    H/O: hysterectomy    History of tobacco use    Hx laparoscopic cholecystectomy    Hypercholesterolemia    Hypertension    Incomplete bladder emptying    Insomnia    Nausea & vomiting 09/08/2017   Postmenopausal atrophic vaginitis    RA (rheumatoid arthritis) (HCC)    Rectocele    Renal calculus    Skin cancer    Sleep apnea    Thyroid  condition    Type 2 diabetes mellitus (HCC)    Urolithiasis    Vaginal enterocele     Family History  Problem Relation Age of Onset   Diabetes Mother    Heart disease Father 69   Diabetes Son    Crohn's disease Son    Past Surgical History:  Procedure Laterality Date   AORTIC ARCH ANGIOGRAPHY N/A 10/22/2022   Procedure: AORTIC ARCH ANGIOGRAPHY;  Surgeon: Lucendia Rusk, MD;  Location: [facility];  Service: Cardiovascular;  Laterality: N/A;   CARPAL TUNNEL RELEASE  2018   CATARACT EXTRACTION, BILATERAL     CHOLECYSTECTOMY     COLONOSCOPY     07/11/2009, 02/2015   GALLBLADDER SURGERY     KNEE ARTHROSCOPY Right 05/20/2020   Procedure: RIGHT KNEE ARTHROSCOPY AND DEBRIDEMENT;  Surgeon: Timothy Ford, MD;  Location: [facility];  Service: Orthopedics;  Laterality: Right;   PARTIAL HYSTERECTOMY     Overies left intact   RIGHT/LEFT HEART CATH AND CORONARY ANGIOGRAPHY N/A 10/22/2022   Procedure: RIGHT/LEFT HEART CATH AND CORONARY ANGIOGRAPHY;  Surgeon: Lucendia Rusk, MD;  Location: [facility];  Service: Cardiovascular;  Laterality: N/A;   SQUAMOUS CELL CARCINOMA EXCISION  05/2018   Social History   Social History Narrative   Diet: Blank      Do you drink/ eat  things with caffeine? Yes      Marital status:  Married                             What year were you married ? 196      Do you live in a house, apartment,assistred living, condo, trailer, etc.)? House      Is it one or more stories? 1      How many persons live in your home ? 2      Do you have any pets in your home ?(please list)  1 Sam-Dog      Highest Level of education completed: 12      Current or past profession: Office      Do you exercise?   Some  Type & how often  Walk Daily      ADVANCED DIRECTIVES (Please bring copies)      Do you have a living will? Blank      Do you have a DNR form?  Blank                     If not, do you want to discuss one?       Do you have signed POA?HPOA forms?  Yes               If so, please bring to your appointment      FUNCTIONAL STATUS- To be completed by Spouse / child / Staff       Do you have difficulty bathing or dressing yourself ?  No      Do you have difficulty preparing food or eating ?  No      Do you have difficulty managing your mediation ?  No      Do you have difficulty managing your finances ?  No      Do you have difficulty affording your medication ?  Yes (Some)      Immunization History  Administered Date(s) Administered   Fluad Quad(high Dose 65+) 04/09/2021   Fluad Trivalent(High Dose 65+) 04/14/2023   Influenza Split 03/28/2008, 03/05/2010, 04/17/2012, 04/20/2013   Influenza, High Dose Seasonal PF 02/08/2020, 05/04/2022   Influenza-Unspecified 05/11/2011, 04/22/2014, 04/15/2015, 02/20/2016, 03/02/2017, 03/09/2018, 03/20/2019, 03/21/2020   PFIZER(Purple Top)SARS-COV-2 Vaccination 07/24/2019, 08/06/2019, 08/27/2019, 04/21/2020   Pneumococcal Conjugate-13 05/02/2014   Pneumococcal Polysaccharide-23 04/23/2008   Td 05/01/2013   Tdap 05/01/2013   Zoster Recombinant(Shingrix) 04/17/2012     Objective: Vital Signs: BP (!) 182/78 (BP Location: Right Arm, Patient Position: Sitting,  Cuff Size: Normal)   Pulse 78   Resp 13   Ht 5\' 1"  (1.549 m)   Wt 155 lb 9.6 oz (70.6 kg)   BMI 29.40 kg/m    Physical Exam Vitals and nursing note reviewed.  Constitutional:      Appearance: She is well-developed.  HENT:     Head: Normocephalic and atraumatic.  Eyes:     Conjunctiva/sclera: Conjunctivae normal.  Cardiovascular:     Rate and Rhythm: Normal rate and regular rhythm.     Heart sounds: Normal heart sounds.  Pulmonary:     Effort: Pulmonary effort is normal.     Breath sounds: Normal breath sounds.  Abdominal:     General: Bowel sounds are normal.     Palpations: Abdomen is soft.  Musculoskeletal:     Cervical back: Normal range of motion.  Lymphadenopathy:     Cervical: No cervical adenopathy.  Skin:    General: Skin is warm and dry.     Capillary Refill: Capillary refill takes less than 2 seconds.  Neurological:     Mental Status: She is alert and oriented to person, place, and time.  Psychiatric:        Behavior: Behavior normal.      Musculoskeletal Exam: Discomfort range of motion of the cervical spine.  Thoracic kyphosis noted.  Shoulder joints, elbow joints and wrist joints have good range of motion.  Prominence and tenderness of the right CMC joint, right first MCP joint, and right first PIP joint.  PIP and DIP thickening noted.  Hip joints have good range of motion with no groin pain.  Knee joints have good range of motion with no warmth or effusion.  Ankle joints have good range  of motion with no tenderness or joint swelling.  CDAI Exam: CDAI Score: -- Patient Global: --; Provider Global: -- Swollen: --; Tender: -- Joint Exam 10/31/2023   No joint exam has been documented for this visit   There is currently no information documented on the homunculus. Go to the Rheumatology activity and complete the homunculus joint exam.  Investigation: No additional findings.  Imaging: VAS US  CAROTID Result Date: 10/26/2023 Carotid Arterial Duplex Study  Patient Name:  Annette Suarez  Date of Exam:   10/26/2023 Medical Rec #: [id number]     Accession #:    [id number] Date of Birth: 05/27/1941     Patient Gender: F Patient Age:   27 years Exam Location:  [facility] Procedure:      VAS US  CAROTID Referring Phys: PETER [place] --------------------------------------------------------------------------------  Indications:       Carotid artery disease and Patient has known left subclavian                    steal syndrome. She complains of weakness in left arm when                    overusing it. Risk Factors:      Hypertension, hyperlipidemia, Diabetes, past history of                    smoking. Other Factors:     10/22/2022 upper aortic arch angiogram 10/22/2022 showed                    proximal left subclavian arterial calcifications. Comparison Study:  Prior carotid duplex exam on 07/16/2020 showed highest                    velocities in right proximal ICA 133/18 cm/s and left mid ICA                    202/42 cm/s. Left vertebral artery was retrograde with                    turbulent proximal flow seen in left subclavian artery. Known                    left subclavian steal syndrome. Performing Technologist: Richarda Chance RVT, RDCS (AE), RDMS  Examination Guidelines: A complete evaluation includes B-mode imaging, spectral Doppler, color Doppler, and power Doppler as needed of all accessible portions of each vessel. Bilateral testing is considered an integral part of a complete examination. Limited examinations for reoccurring indications may be performed as noted.  Right Carotid Findings: +----------+--------+--------+--------+--------------------+------------------+           PSV cm/sEDV cm/sStenosisPlaque Description  Comments           +----------+--------+--------+--------+--------------------+------------------+ CCA Prox  67      0                                                       +----------+--------+--------+--------+--------------------+------------------+ CCA Distal68      12              calcific and focal  intimal thickening +----------+--------+--------+--------+--------------------+------------------+ ICA Prox  142     22      1-39%  high-end range     +----------+--------+--------+--------+--------------------+------------------+ ICA Mid   115     13                                  tortuous           +----------+--------+--------+--------+--------------------+------------------+ ICA Distal134     10                                  tortuous           +----------+--------+--------+--------+--------------------+------------------+ ECA       136     14              calcific and diffuse                   +----------+--------+--------+--------+--------------------+------------------+ +----------+--------+-------+----------------+-------------------+           PSV cm/sEDV cmsDescribe        Arm Pressure (mmHG) +----------+--------+-------+----------------+-------------------+ [id number]     0      Multiphasic, [id number]                 +----------+--------+-------+----------------+-------------------+ +---------+--------+---+--------+-+ VertebralPSV cm/s105EDV cm/s6 +---------+--------+---+--------+-+ Right brachial artery, antecubital fossa 69 cm/s, biphasic flow, brisk upstroke  Left Carotid Findings: +----------+--------+--------+--------+------------------+---------+           PSV cm/sEDV cm/sStenosisPlaque DescriptionComments  +----------+--------+--------+--------+------------------+---------+ CCA Prox  83      10                                          +----------+--------+--------+--------+------------------+---------+ CCA Distal138     15      <50%    focal and calcific          +----------+--------+--------+--------+------------------+---------+ ICA Prox  124     18      1-39%                      Shadowing +----------+--------+--------+--------+------------------+---------+ ICA Mid   118     24                                tortuous  +----------+--------+--------+--------+------------------+---------+ ICA Distal92      19                                tortuous  +----------+--------+--------+--------+------------------+---------+ ECA       155     0                                 shadowing +----------+--------+--------+--------+------------------+---------+ +----------+-------+--------+-------------------------------+------------------+           PSV    EDV cm/sDescribe                       Arm Pressure                 cm/s                                          (  mmHG)             +----------+-------+--------+-------------------------------+------------------+ Subclavian187    0       Turbulent flow with dampened   121                                         flow                                              +----------+-------+--------+-------------------------------+------------------+ +---------+--------+---+--------+--+----------+ VertebralPSV cm/s108EDV cm/s11Retrograde +---------+--------+---+--------+--+----------+ Left brachial artery, antecubital fossa 45 cm/s, dampened biphasic waveform. Unable to duplicate mid ICA velocities, tortuous vessel.  Summary: Right Carotid: Velocities in the right ICA are consistent with a 1-39% stenosis. Left Carotid: Velocities in the left ICA are consistent with a 1-39% stenosis.               Non-hemodynamically significant plaque <50% noted in the CCA. Vertebrals:  Right vertebral artery demonstrates antegrade flow. Left vertebral              artery demonstrates retrograde flow. Subclavians: Left subclavian artery flow was disturbed with dampened flow. Known              left subclavian steal syndrome. Normal flow hemodynamics were seen              in the right subclavian artery. *See  table(s) above for measurements and observations. Suggest follow up study in 12 months. Electronically signed by Armida Lander MD on 10/26/2023 at 3:35:32 PM.    Final     Recent Labs: Lab Results  Component Value Date   WBC 11.3 (H) 08/18/2023   HGB 12.5 08/18/2023   PLT 299 08/18/2023   NA 139 08/18/2023   K 4.4 08/18/2023   CL 100 08/18/2023   CO2 24 08/18/2023   GLUCOSE 198 (H) 08/18/2023   BUN 24 08/18/2023   CREATININE 1.18 (H) 08/18/2023   BILITOT 1.2 08/18/2023   ALKPHOS 83 07/25/2019   AST 13 08/18/2023   ALT 10 08/18/2023   PROT 6.7 08/18/2023   ALBUMIN 4.6 07/25/2019   CALCIUM  9.4 08/18/2023   GFRAA 43 (L) 01/09/2021   QFTBGOLDPLUS NEGATIVE 08/18/2023    Speciality Comments: Recieves Enbrel  through [facility] 02/20/2019 Osteopenia managed by Dr. Fleta Human 02/20/2019  Procedures:  No procedures performed Allergies: Latex, Amaryl [glimepiride], Amlodipine , Codeine, Jardiance [empagliflozin], Pneumovax 23 [pneumococcal vac polyvalent], and Wellbutrin [bupropion]     Assessment / Plan:     Visit Diagnoses: Rheumatoid arthritis of multiple sites with negative rheumatoid factor (HCC) -She has no synovitis on examination today.  She has been experiencing increased morning stiffness especially in both hands as well as pain in the right thumb especially over the [facility] joint.  She remains on Enbrel  50 mg subcutaneous injections once weekly.  She is tolerating Enbrel  without any side effects or injection site reactions.  She has not had any recent gaps in therapy and administered her dose yesterday.  No recent or recurrent infections.  Plan to check ESR and CRP today.  Discussed that we can consider scheduling an ultrasound of both hands pending lab results today.  She will remain on Enbrel  as monotherapy for now.  She will follow-up in the office in 5 months or sooner if needed.  Plan: Sedimentation rate, C-reactive protein  High risk medication use - Enbrel   50 mg sq injections once weekly (inadequate response to peripheral)  CBC and CMP updated on 08/18/23.  Orders for CBC and CMP were released today. TB gold negative on 08/18/23.  No recent or recurrent infections. Discussed the importance of holding enbrel  if she develops signs or symptoms of an infection and to resume once the infection has completely cleared.   - Plan: CBC with Differential/Platelet, Comprehensive metabolic panel with GFR  Primary osteoarthritis of both hands: She has PIP and DIP thickening consistent with osteoarthritis of both hands.  Tenderness and thickening of the right CMC joint.  She has been experiencing increased morning stiffness involving both hands lasting for about 1 hour daily.  No inflammation was noted on examination today.  Plan to check sed rate and CRP today.  Also discussed considering scheduling an ultrasound to assess for synovitis.  Trochanteric bursitis, right hip: Intermittent discomfort.   Primary osteoarthritis of right knee - Severe osteoarthritis and moderate chondromalacia patella was noted on the x-rays January 2022.  Patient continues to experience intermittent discomfort especially in the right knee.  She has an appointment scheduled with orthopedics today to initiate a viscosupplementation series.  Primary osteoarthritis of both feet: She is not experiencing any increased discomfort in her feet at this time.   Neck pain - 08/05/22: multilevel severe degenerative disc disease and facet joint arthropathy. She continues to experience intermittent pain and stiffness in the cervical spine.    Degeneration of intervertebral disc of lumbar region without discogenic back pain or lower extremity pain - Multilevel spondylosis and facet joint arthropathy was noted on the x-rays in 2018 done by Dr. Raquel Cables.  She has been experiencing some increased difficulty with balance.  Offered a referral to physical therapy.   Osteopenia of multiple sites: She is taking a  calcium  and vitamin D  supplement daily.    Vitamin D  deficiency: She is taking vitamin D  2000 units daily.    Other medical conditions are listed as follows:   Type 2 diabetes mellitus   Essential hypertension: Blood pressure was elevated today in the office and was rechecked prior to leaving.  Patient was advised to monitor blood pressure closely and to reach out to PCP if it remains elevated.  Aortic atherosclerosis (HCC)  History of hyperlipidemia  Gastroesophageal reflux disease without esophagitis  History of anxiety  Nocturnal muscle cramps  History of vertigo  History of sleep apnea  Former smoker  Orders: Orders Placed This Encounter  Procedures   Sedimentation rate   C-reactive protein   CBC with Differential/Platelet   Comprehensive metabolic panel with GFR   No orders of the defined types were placed in this encounter.    Follow-Up Instructions: Return in about 5 months (around 04/01/2024) for Rheumatoid arthritis.   Romayne Clubs, PA-C  Note - This record has been created using Dragon software.  Chart creation errors have been sought, but may not always  have been located. Such creation errors do not reflect on  the standard of medical care.

## 2023-10-18 ENCOUNTER — Telehealth: Payer: Self-pay | Admitting: Internal Medicine

## 2023-10-18 NOTE — Telephone Encounter (Signed)
 Spoke to patient she was calling to cancel appointment with Dr.Arida 4/29.Stated she has appointment with Dr.Chandrasekhar 5/12 at 1:20 pm to follow up recent dopplers.Advised I will cancel appointment with Dr.Arida.

## 2023-10-18 NOTE — Telephone Encounter (Signed)
 Pt is requesting a callback regarding her wanting to know why she has to come in to see Dr. Alvenia Aus post testing on 4/29 before she cancels it. Please advise

## 2023-10-19 ENCOUNTER — Other Ambulatory Visit: Payer: Self-pay | Admitting: *Deleted

## 2023-10-19 DIAGNOSIS — Z79899 Other long term (current) drug therapy: Secondary | ICD-10-CM

## 2023-10-19 DIAGNOSIS — M0609 Rheumatoid arthritis without rheumatoid factor, multiple sites: Secondary | ICD-10-CM

## 2023-10-19 MED ORDER — ENBREL SURECLICK 50 MG/ML ~~LOC~~ SOAJ: 50.0000 mg | SUBCUTANEOUS | 0 refills | Status: DC

## 2023-10-19 NOTE — Telephone Encounter (Signed)
 Refill request received via fax from [facility] for Enbrel   Last Fill: 06/21/2023  Labs: 08/18/2023 Glucose elevated at 198.  Creatinine is elevated and stable.  CBC shows mildly elevated WBC count.   TB Gold: 08/18/2023 Neg   Next Visit: 10/31/2023  Last Visit: 05/31/2023  HY:[id number] arthritis of multiple sites with negative rheumatoid factor   Current Dose per office note 05/31/2023: Enbrel 50 mg subcu weekly   Okay to refill Enbrel?

## 2023-10-24 ENCOUNTER — Ambulatory Visit: Admitting: Cardiology

## 2023-10-26 ENCOUNTER — Ambulatory Visit (HOSPITAL_COMMUNITY): Admission: RE | Admit: 2023-10-26 | Source: Ambulatory Visit

## 2023-10-26 ENCOUNTER — Ambulatory Visit (HOSPITAL_COMMUNITY)
Admission: RE | Admit: 2023-10-26 | Discharge: 2023-10-26 | Disposition: A | Discharge status: Home / Self Care | Source: Ambulatory Visit | Attending: Cardiovascular Disease | Admitting: Cardiovascular Disease

## 2023-10-26 DIAGNOSIS — I771 Stricture of artery: Secondary | ICD-10-CM | POA: Diagnosis not present

## 2023-10-26 DIAGNOSIS — R42 Dizziness and giddiness: Secondary | ICD-10-CM

## 2023-10-26 DIAGNOSIS — I10 Essential (primary) hypertension: Secondary | ICD-10-CM

## 2023-10-31 ENCOUNTER — Encounter: Payer: Self-pay | Admitting: Physician Assistant

## 2023-10-31 ENCOUNTER — Ambulatory Visit: Payer: Medicare Other | Attending: Physician Assistant | Admitting: Physician Assistant

## 2023-10-31 VITALS — BP 182/78 | HR 78 | Resp 13 | Ht 61.0 in | Wt 155.6 lb

## 2023-10-31 DIAGNOSIS — I700 Atherosclerosis of aorta: Secondary | ICD-10-CM

## 2023-10-31 DIAGNOSIS — M7061 Trochanteric bursitis, right hip: Secondary | ICD-10-CM | POA: Diagnosis not present

## 2023-10-31 DIAGNOSIS — M1711 Unilateral primary osteoarthritis, right knee: Secondary | ICD-10-CM

## 2023-10-31 DIAGNOSIS — Z79899 Other long term (current) drug therapy: Secondary | ICD-10-CM

## 2023-10-31 DIAGNOSIS — E118 Type 2 diabetes mellitus with unspecified complications: Secondary | ICD-10-CM

## 2023-10-31 DIAGNOSIS — M19072 Primary osteoarthritis, left ankle and foot: Secondary | ICD-10-CM

## 2023-10-31 DIAGNOSIS — Z8659 Personal history of other mental and behavioral disorders: Secondary | ICD-10-CM

## 2023-10-31 DIAGNOSIS — M19041 Primary osteoarthritis, right hand: Secondary | ICD-10-CM

## 2023-10-31 DIAGNOSIS — M542 Cervicalgia: Secondary | ICD-10-CM

## 2023-10-31 DIAGNOSIS — M8589 Other specified disorders of bone density and structure, multiple sites: Secondary | ICD-10-CM | POA: Diagnosis not present

## 2023-10-31 DIAGNOSIS — M19071 Primary osteoarthritis, right ankle and foot: Secondary | ICD-10-CM

## 2023-10-31 DIAGNOSIS — Z8639 Personal history of other endocrine, nutritional and metabolic disease: Secondary | ICD-10-CM

## 2023-10-31 DIAGNOSIS — Z794 Long term (current) use of insulin: Secondary | ICD-10-CM

## 2023-10-31 DIAGNOSIS — I10 Essential (primary) hypertension: Secondary | ICD-10-CM | POA: Diagnosis not present

## 2023-10-31 DIAGNOSIS — Z87898 Personal history of other specified conditions: Secondary | ICD-10-CM

## 2023-10-31 DIAGNOSIS — M51369 Other intervertebral disc degeneration, lumbar region without mention of lumbar back pain or lower extremity pain: Secondary | ICD-10-CM

## 2023-10-31 DIAGNOSIS — M0609 Rheumatoid arthritis without rheumatoid factor, multiple sites: Secondary | ICD-10-CM | POA: Diagnosis not present

## 2023-10-31 DIAGNOSIS — E559 Vitamin D deficiency, unspecified: Secondary | ICD-10-CM | POA: Diagnosis not present

## 2023-10-31 DIAGNOSIS — K219 Gastro-esophageal reflux disease without esophagitis: Secondary | ICD-10-CM

## 2023-10-31 DIAGNOSIS — Z87891 Personal history of nicotine dependence: Secondary | ICD-10-CM

## 2023-10-31 DIAGNOSIS — Z8669 Personal history of other diseases of the nervous system and sense organs: Secondary | ICD-10-CM

## 2023-10-31 DIAGNOSIS — M19042 Primary osteoarthritis, left hand: Secondary | ICD-10-CM

## 2023-10-31 DIAGNOSIS — R252 Cramp and spasm: Secondary | ICD-10-CM

## 2023-10-31 NOTE — Patient Instructions (Signed)
Standing Labs We placed an order today for your standing lab work.   Please have your standing labs drawn in August and every 3 months   Please have your labs drawn 2 weeks prior to your appointment so that the provider can discuss your lab results at your appointment, if possible.  Please note that you may see your imaging and lab results in MyChart before we have reviewed them. We will contact you once all results are reviewed. Please allow our office up to 72 hours to thoroughly review all of the results before contacting the office for clarification of your results.  WALK-IN LAB HOURS  Monday through Thursday from 8:00 am -12:30 pm and 1:00 pm-5:00 pm and Friday from 8:00 am-12:00 pm.  Patients with office visits requiring labs will be seen before walk-in labs.  You may encounter longer than normal wait times. Please allow additional time. Wait times may be shorter on  Monday and Thursday afternoons.  We do not book appointments for walk-in labs. We appreciate your patience and understanding with our staff.   Labs are drawn by [facility]. Please bring your co-pay at the time of your lab draw.  You may receive a bill from [facility] for your lab work.  Please note if you are on Hydroxychloroquine and and an order has been placed for a Hydroxychloroquine level,  you will need to have it drawn 4 hours or more after your last dose.  If you wish to have your labs drawn at another location, please call the office 24 hours in advance so we can fax the orders.  The office is located at [address], [address], [place], [place] [address]   If you have any questions regarding directions or hours of operation,  please call [phone].   As a reminder, please drink plenty of water prior to coming for your lab work. Thanks!  

## 2023-11-01 ENCOUNTER — Ambulatory Visit: Admitting: Cardiovascular Disease

## 2023-11-01 LAB — CBC WITH DIFFERENTIAL/PLATELET
Absolute Lymphocytes: 2037 {cells}/uL (ref 850–3900)
Absolute Monocytes: 495 {cells}/uL (ref 200–950)
Basophils Absolute: 78 {cells}/uL (ref 0–200)
Basophils Relative: 0.8 %
Eosinophils Absolute: 272 {cells}/uL (ref 15–500)
Eosinophils Relative: 2.8 %
HCT: 39.1 % (ref 35.0–45.0)
Hemoglobin: 12.9 g/dL (ref 11.7–15.5)
MCH: 31.3 pg (ref 27.0–33.0)
MCHC: 33 g/dL (ref 32.0–36.0)
MCV: 94.9 fL (ref 80.0–100.0)
MPV: 11.7 fL (ref 7.5–12.5)
Monocytes Relative: 5.1 %
Neutro Abs: 6819 {cells}/uL (ref 1500–7800)
Neutrophils Relative %: 70.3 %
Platelets: 290 10*3/uL (ref 140–400)
RBC: 4.12 10*6/uL (ref 3.80–5.10)
RDW: 12.1 % (ref 11.0–15.0)
Total Lymphocyte: 21 %
WBC: 9.7 10*3/uL (ref 3.8–10.8)

## 2023-11-01 LAB — C-REACTIVE PROTEIN: CRP: 3 mg/L (ref <8.0)

## 2023-11-01 LAB — COMPREHENSIVE METABOLIC PANEL WITH GFR
AG Ratio: 1.8 (calc) (ref 1.0–2.5)
ALT: 9 U/L (ref 6–29)
AST: 11 U/L (ref 10–35)
Albumin: 4.4 g/dL (ref 3.6–5.1)
Alkaline phosphatase (APISO): 93 U/L (ref 37–153)
BUN/Creatinine Ratio: 23 (calc) — ABNORMAL HIGH (ref 6–22)
BUN: 26 mg/dL — ABNORMAL HIGH (ref 7–25)
CO2: 31 mmol/L (ref 20–32)
Calcium: 9.5 mg/dL (ref 8.6–10.4)
Chloride: 99 mmol/L (ref 98–110)
Creat: 1.14 mg/dL — ABNORMAL HIGH (ref 0.60–0.95)
Globulin: 2.5 g/dL (ref 1.9–3.7)
Glucose, Bld: 252 mg/dL — ABNORMAL HIGH (ref 65–99)
Potassium: 4.6 mmol/L (ref 3.5–5.3)
Sodium: 138 mmol/L (ref 135–146)
Total Bilirubin: 1.2 mg/dL (ref 0.2–1.2)
Total Protein: 6.9 g/dL (ref 6.1–8.1)
eGFR: 49 mL/min/{1.73_m2} — ABNORMAL LOW (ref >=60)

## 2023-11-01 LAB — EXTRA SPECIMEN

## 2023-11-01 LAB — SEDIMENTATION RATE: Sed Rate: 6 mm/h (ref 0–30)

## 2023-11-01 NOTE — Progress Notes (Signed)
 Glucose was very elevated at 252 yesterday.  Creatinine was elevated at 1.14 and GFR was low at 49--stable.  Avoid the use of NSAIDs. Rest of CMP within normal limits.  CBC within normal limits.  ESR and CRP within normal limits.

## 2023-11-02 DIAGNOSIS — M1711 Unilateral primary osteoarthritis, right knee: Secondary | ICD-10-CM | POA: Diagnosis not present

## 2023-11-09 DIAGNOSIS — M1711 Unilateral primary osteoarthritis, right knee: Secondary | ICD-10-CM | POA: Diagnosis not present

## 2023-11-14 ENCOUNTER — Telehealth: Payer: Self-pay | Admitting: Internal Medicine

## 2023-11-14 ENCOUNTER — Ambulatory Visit: Attending: Internal Medicine | Admitting: Internal Medicine

## 2023-11-14 VITALS — BP 173/75 | HR 81 | Ht 61.0 in | Wt 156.0 lb

## 2023-11-14 DIAGNOSIS — I152 Hypertension secondary to endocrine disorders: Secondary | ICD-10-CM | POA: Diagnosis not present

## 2023-11-14 DIAGNOSIS — I25118 Atherosclerotic heart disease of native coronary artery with other forms of angina pectoris: Secondary | ICD-10-CM

## 2023-11-14 DIAGNOSIS — I771 Stricture of artery: Secondary | ICD-10-CM | POA: Diagnosis not present

## 2023-11-14 DIAGNOSIS — I700 Atherosclerosis of aorta: Secondary | ICD-10-CM | POA: Diagnosis not present

## 2023-11-14 DIAGNOSIS — E1159 Type 2 diabetes mellitus with other circulatory complications: Secondary | ICD-10-CM

## 2023-11-14 MED ORDER — CARVEDILOL 12.5 MG PO TABS: 12.5000 mg | ORAL_TABLET | Freq: Two times a day (BID) | ORAL | 3 refills | Status: AC

## 2023-11-14 NOTE — Progress Notes (Signed)
 Cardiology Office Note:  .    Date:  11/14/2023  ID:  Annette Suarez, DOB 09/10/1940, MRN [id number] PCP: Arnetha Bhat, NP  [facility] Providers Cardiologist:  Jann Melody, MD     CC: f/u symptomatic subclavian steal syndrome  History of Present Illness: .    Annette Suarez is a 83 y.o. female  with nonobstructive coronary artery disease, hypertension, and hyperlipidemia who presents with symptoms of subclavian stenosis. She was referred by Dr. Juliaette Ober for follow-up on subclavian stenosis.  She experiences dizziness, elevated blood pressure, and bendopnea, particularly when bending over and standing back up. She describes this as 'a lot of swimming head' and finds it very worrisome as it affects her ability to perform daily activities.  She has a history of left-sided subclavian stenosis, initially identified during a visit for dizziness and elevated blood pressure. Repeat imaging confirmed the presence of subclavian stenosis. She recalls being diagnosed with subclavian stenosis in 2022 after an echocardiogram and other tests were conducted due to a murmur. At that time, her symptoms were not as severe, and her blood pressure was better controlled.  She notes tingling in her hands, particularly at night, but does not associate this directly with her main symptoms. She also reports headaches that are not painful but feel 'weird'.  She has a history of diabetes with fluctuating blood sugar levels and arthritis. She does not smoke, and her cholesterol is at the upper limits of normal.  Relevant histories: .  Social comes with husband 2022:  Had subclavian stenosis and aortic atherosclerosis, see by Dr. Alvenia Aus for PAD.  Stopped norvasx for leg swelling. 2023: DOD for CP, CCTA FFR negative. 2024: no chest pain doing well ROS: As per HPI.   Studies Reviewed: .     Cardiac Studies & Procedures    ______________________________________________________________________________________________ CARDIAC CATHETERIZATION  CARDIAC CATHETERIZATION 10/22/2022  Conclusion   Mid Cx lesion is 50% stenosed.  Moderate calcification.   Dist Cx lesion is 50% stenosed.   The left ventricular systolic function is normal.   LV end diastolic pressure is mildly elevated.  LVEDP 21 mmHg.   The left ventricular ejection fraction is greater than 65% by visual estimate.   There is no aortic valve stenosis.   On 2L [place] Aortic saturation 100%, PA saturation 79%, RA pressure 17/13, mean RA pressure 12 mmHg, PA pressure 39/19, mean PA pressure 25 mmHg, pulmonary capillary wedge pressure 22/22, mean pulmonary capillary wedge pressure 22 mmHg, cardiac output 6 L/min, cardiac index 3.5.   Heavily calcified proximal left subclavian with delayed filling.  Nonobstructive coronary artery disease.  Upper normal pulmonary artery pressures.  Heavily calcified proximal left subclavian.  Findings Coronary Findings Diagnostic  Dominance: Right  Left Anterior Descending The vessel exhibits minimal luminal irregularities.  Left Circumflex Mid Cx lesion is 50% stenosed. The lesion is eccentric. Dist Cx lesion is 50% stenosed.  Intervention  No interventions have been documented.     ECHOCARDIOGRAM  ECHOCARDIOGRAM COMPLETE 07/08/2020  Narrative ECHOCARDIOGRAM REPORT    Patient Name:   Annette Suarez  Date of Exam: 07/08/2020 Medical Rec #:  [id number]     Height:       61.0 in Accession #:    [id number]    Weight:       158.0 lb Date of Birth:  1941/05/28     BSA:          1.709 m Patient Age:    14 years  BP:           140/92 mmHg Patient Gender: F             HR:           90 bpm. Exam Location:  [address]  Procedure: 2D Echo, 3D Echo, Cardiac Doppler, Color Doppler and Strain Analysis  Indications:    I10 Hypertension  History:        Patient has no prior history of Echocardiogram  examinations. Risk Factors:Diabetes, Sleep Apnea, Former Smoker and Dyslipidemia.  Sonographer:    Henriette Lofty, RDCS Referring Phys: [id number] [facility] A Skyelar Swigart  IMPRESSIONS   1. Left ventricular ejection fraction, by estimation, is >75%. The left ventricle has hyperdynamic function. The left ventricle has no regional wall motion abnormalities. Left ventricular diastolic parameters are consistent with Grade I diastolic dysfunction (impaired relaxation). Elevated left atrial pressure. 2. Right ventricular systolic function is normal. The right ventricular size is normal. There is mildly elevated pulmonary artery systolic pressure. 3. The mitral valve is normal in structure. Mild mitral valve regurgitation. No evidence of mitral stenosis. 4. The aortic valve is tricuspid. Aortic valve regurgitation is trivial. Mild aortic valve sclerosis is present, with no evidence of aortic valve stenosis. 5. The inferior vena cava is normal in size with greater than 50% respiratory variability, suggesting right atrial pressure of 3 mmHg.  FINDINGS Left Ventricle: Left ventricular ejection fraction, by estimation, is >75%. The left ventricle has hyperdynamic function. The left ventricle has no regional wall motion abnormalities. The left ventricular internal cavity size was normal in size. There is no left ventricular hypertrophy. Left ventricular diastolic parameters are consistent with Grade I diastolic dysfunction (impaired relaxation). Elevated left atrial pressure.  Right Ventricle: The right ventricular size is normal. Right ventricular systolic function is normal. There is mildly elevated pulmonary artery systolic pressure. The tricuspid regurgitant velocity is 2.97 m/s, and with an assumed right atrial pressure of 3 mmHg, the estimated right ventricular systolic pressure is 38.3 mmHg.  Left Atrium: Left atrial size was normal in size.  Right Atrium: Right atrial size was normal in  size.  Pericardium: There is no evidence of pericardial effusion.  Mitral Valve: The mitral valve is normal in structure. Mild mitral valve regurgitation. No evidence of mitral valve stenosis.  Tricuspid Valve: The tricuspid valve is normal in structure. Tricuspid valve regurgitation is mild . No evidence of tricuspid stenosis.  Aortic Valve: The aortic valve is tricuspid. Aortic valve regurgitation is trivial. Mild aortic valve sclerosis is present, with no evidence of aortic valve stenosis.  Pulmonic Valve: The pulmonic valve was normal in structure. Pulmonic valve regurgitation is trivial. No evidence of pulmonic stenosis.  Aorta: The aortic root is normal in size and structure.  Venous: The inferior vena cava is normal in size with greater than 50% respiratory variability, suggesting right atrial pressure of 3 mmHg.  IAS/Shunts: No atrial level shunt detected by color flow Doppler.   LEFT VENTRICLE PLAX 2D LVIDd:         4.00 cm  Diastology LVIDs:         2.80 cm  LV e' medial:    4.28 cm/s LV PW:         0.90 cm  LV E/e' medial:  23.1 LV IVS:        0.90 cm  LV e' lateral:   5.46 cm/s LVOT diam:     2.00 cm  LV E/e' lateral: 18.1 LV SV:  75 LV SV Index:   44       2D Longitudinal Strain LVOT Area:     3.14 cm 2D Strain GLS (A2C):   -15.6 % 2D Strain GLS (A3C):   -15.9 % 2D Strain GLS (A4C):   -17.2 % 2D Strain GLS Avg:     -16.2 %  3D Volume EF: 3D EF:        58 % LV EDV:       85 ml LV ESV:       36 ml LV SV:        49 ml  RIGHT VENTRICLE RV Basal diam:  3.20 cm RV S prime:     12.20 cm/s TAPSE (M-mode): 1.4 cm RVSP:           38.3 mmHg  LEFT ATRIUM             Index       RIGHT ATRIUM           Index LA diam:        4.00 cm 2.34 cm/m  RA Pressure: 3.00 mmHg LA Vol (A2C):   32.1 ml 18.79 ml/m RA Area:     7.38 cm LA Vol (A4C):   29.9 ml 17.50 ml/m RA Volume:   11.20 ml  6.55 ml/m LA Biplane Vol: 31.7 ml 18.55 ml/m AORTIC VALVE LVOT Vmax:   102.00  cm/s LVOT Vmean:  70.400 cm/s LVOT VTI:    0.239 m  AORTA Ao Root diam: 3.20 cm  MITRAL VALVE                TRICUSPID VALVE TR Peak grad:   35.3 mmHg TR Vmax:        297.00 cm/s MV E velocity: 99.00 cm/s   Estimated RAP:  3.00 mmHg MV A velocity: 102.00 cm/s  RVSP:           38.3 mmHg MV E/A ratio:  0.97 SHUNTS Systemic VTI:  0.24 m Systemic Diam: 2.00 cm  Alexandria Angel MD Electronically signed by Alexandria Angel MD Signature Date/Time: 07/08/2020/11:39:54 AM    Final      CT SCANS  CT CORONARY FRACTIONAL FLOW RESERVE DATA PREP 09/21/2021  Narrative EXAM: FFRCT ANALYSIS  FINDINGS: FFRct analysis was performed on the original cardiac CT angiogram dataset. Diagrammatic representation of the FFRct analysis is provided in a separate PDF document in PACS. This dictation was created using the PDF document and an interactive 3D model of the results. 3D model is not available in the EMR/PACS. Normal FFR range is >0.80.  1. Left Main: No significant stenosis. LM FFR = 1.00.  2. LAD: No significant stenosis. Proxial FFR = 0.97, Mid FFR = 0.88, Distal FFR = 0.80.  3. LCX: No significant stenosis. Proximal FFR = 0.99, Mmid FFR = 0.98. The distal LCx was not modeled.  4. RCA: No significant stenosis. Proximal FFR =0.98, Mid FFR = 0.92, Distal FFR =0.86. The PDA and RPLA were not modeled.  IMPRESSION: 1. Coronary CTA FFR analysis demonstrates no hemodynamically flow limiting lesions but the distal LCx, RPLA and PDA were not modeled. On coronary CTA there was moderate plaque in the mid LCx and distal RCA which correspond to the areas on FFR that could not be modeled.  2. Recommend aggressive medical therapy and consider cath if patient fails medical therapy.  Gaylyn Keas, MD   Electronically Signed By: Gaylyn Keas M.D. On: 09/21/2021 12:17   CT CORONARY MORPH W/CTA COR  W/SCORE 09/21/2021  Addendum 09/22/2021  8:01 AM ADDENDUM REPORT: 09/22/2021  07:59  EXAM: OVER-READ INTERPRETATION  CT CHEST  The following report is an over-read performed by radiologist Dr. Babs Bolster [facility] on 09/21/2021. This over-read does not include interpretation of cardiac or coronary anatomy or pathology. The coronary calcium  score and cardiac CTA interpretation by the cardiologist is attached.  COMPARISON:  Chest CTA 07/08/2020.  FINDINGS: Atherosclerotic calcifications in the thoracic aorta. Within the visualized portions of the thorax there are no suspicious appearing pulmonary nodules or masses, there is no acute consolidative airspace disease, no pleural effusions, no pneumothorax and no lymphadenopathy. Visualized portions of the upper abdomen are unremarkable. There are no aggressive appearing lytic or blastic lesions noted in the visualized portions of the skeleton.  IMPRESSION: 1.  Aortic Atherosclerosis (ICD10-I70.0).   Electronically Signed By: Alexandria Angel M.D. On: 09/22/2021 07:59  Narrative CLINICAL DATA:  Chest pain  EXAM: Cardiac/Coronary CTA  TECHNIQUE: A non-contrast, gated CT scan was obtained with axial slices of 3 mm through the heart for calcium  scoring. Calcium  scoring was performed using the Agatston method. A 120 kV prospective, gated, contrast cardiac scan was obtained. Gantry rotation speed was 250 msecs and collimation was 0.6 mm. Two sublingual nitroglycerin  tablets (0.8 mg) were given. The 3D data set was reconstructed in 5% intervals of the 35-75% of the R-R cycle. Diastolic phases were analyzed on a dedicated workstation using MPR, MIP, and VRT modes. The patient received 95 cc of contrast.  FINDINGS: Image quality: Excellent.  Noise artifact is: Limited.  Coronary Arteries:  Normal coronary origin.  Right dominance.  Left main: The left main is a large caliber vessel with a normal take off from the left coronary cusp that bifurcates to form a left anterior descending  artery and a left circumflex artery. There is mild calcified plaque in the proximal to mid LM with associated stenosis of 25-49%.  Left anterior descending artery: The LAD gives off 2 patent diagonal branches. There is mild calcified plaque in the proximal LAD with associated stenosis of 25-49%. There is moderate calcified plaque in the mid LAD after the takeoff of D1 with associated stenosis of 50-69%.  Left circumflex artery: The LCX is non-dominant and gives off 2 patent obtuse marginal branches. There is mild to moderate calcified plaque in the mid LCx and in a large proximal OM2 with associated stenosis of at least 25-49% and may be > 50%.  Right coronary artery: The RCA is dominant with normal take off from the right coronary cusp. The RCA terminates as a PDA and right posterolateral branch. There is moderate mixed plaque in the proximal RCA with associated stenosis of 50-69%. There is moderate calcified plaque in the distal RCA with associated stenosis of 50-69%  Right Atrium: Right atrial size is within normal limits.  Right Ventricle: The right ventricular cavity is within normal limits.  Left Atrium: Left atrial size is normal in size with no left atrial appendage filling defect. PFO is present.  Left Ventricle: The ventricular cavity size is within normal limits. There are no stigmata of prior infarction. There is no abnormal filling defect.  Pulmonary arteries: Normal in size without proximal filling defect.  Pulmonary veins: Normal pulmonary venous drainage.  Pericardium: Normal thickness with no significant effusion or calcium  present.  Cardiac valves: The aortic valve is trileaflet without significant calcification. The mitral valve is normal structure without significant calcification.  Aorta: Normal caliber with aortic atherosclerosis.  Extra-cardiac findings:  See attached radiology report for non-cardiac structures.  IMPRESSION: 1. Coronary calcium   score of 1312. This was 94th percentile for age-, sex, and race-matched controls.  2.  Normal coronary origin with right dominance.  3.  Moderate atherosclerosis.  CAD RADS 3.  4.  PFO is present.  5.  Aortic atherosclerosis.  6. Consider symptom-guided anti-ischemic and preventive pharmacotherapy as well as risk factor modification per guideline-directed care.  7.  This study has been submitted for FFR analysis.  RECOMMENDATIONS: 1. CAD-RADS 0: No evidence of CAD (0%). Consider non-atherosclerotic causes of chest pain.  2. CAD-RADS 1: Minimal non-obstructive CAD (0-24%). Consider non-atherosclerotic causes of chest pain. Consider preventive therapy and risk factor modification.  3. CAD-RADS 2: Mild non-obstructive CAD (25-49%). Consider non-atherosclerotic causes of chest pain. Consider preventive therapy and risk factor modification.  4. CAD-RADS 3: Moderate stenosis. Consider symptom-guided anti-ischemic pharmacotherapy as well as risk factor modification per guideline directed care. Additional analysis with CT FFR will be submitted.  5. CAD-RADS 4: Severe stenosis. (70-99% or > 50% left main). Cardiac catheterization or CT FFR is recommended. Consider symptom-guided anti-ischemic pharmacotherapy as well as risk factor modification per guideline directed care. Invasive coronary angiography recommended with revascularization per published guideline statements.  6. CAD-RADS 5: Total coronary occlusion (100%). Consider cardiac catheterization or viability assessment. Consider symptom-guided anti-ischemic pharmacotherapy as well as risk factor modification per guideline directed care.  7. CAD-RADS N: Non-diagnostic study. Obstructive CAD can't be excluded. Alternative evaluation is recommended.  Gaylyn Keas, MD  Electronically Signed: By: Gaylyn Keas M.D. On: 09/21/2021 09:37      ______________________________________________________________________________________________        Physical Exam:    VS:  BP (!) 173/75 (BP Location: Right Arm)   Pulse 81   Ht 5\' 1"  (1.549 m)   Wt 156 lb (70.8 kg)   SpO2 97%   BMI 29.48 kg/m    Wt Readings from Last 3 Encounters:  11/14/23 156 lb (70.8 kg)  10/31/23 155 lb 9.6 oz (70.6 kg)  08/29/23 159 lb (72.1 kg)    Gen: no distress   Neck: No JVD, Left subclavian bruit Cardiac: No Rubs or Gallops, systolic murmur, RRR +2 radial pulses Respiratory: Clear to auscultation bilaterally, normal effort, normal  respiratory rate GI: Soft, nontender, non-distended  MS: No  edema;  moves all extremities Integument: Skin feels warm Neuro:  At time of evaluation, alert and oriented to person/place/time/situation  Psych: Normal affect, patient feels ok   ASSESSMENT AND PLAN: .    Subclavian artery stenosis Confirmed left-sided subclavian artery stenosis with symptoms consistent with subclavian steal syndrome, including dizziness, lightheadedness, and elevated blood pressure. Symptoms have worsened since initial diagnosis in 2022. Intervention is necessary to prevent further complications such as stroke or refractory hypertension. Approximately 2-7% of individuals experience this condition. Discussed potential stent placement with risks including bleeding, plaque dislodgement, and vessel damage. The procedure may be performed via the leg or arm, with specifics to be determined by the interventionalist. - Refer to Dr. Nadia Aurora for evaluation and potential intervention. - Discuss potential stent placement with Dr. Alvenia Aus, including risks of bleeding, plaque dislodgement, and vessel damage.  Hypertension Hypertension with elevated blood pressure readings, currently 173/75 mmHg. Blood pressure management is crucial due to the risk of complications from subclavian artery stenosis. - Increase Coreg  to 12.5 mg short term for blood  pressure control.; continue current managment  Nonobstructive coronary artery disease HLD with DM - stable on current medications; LDL goal < 70  F/u with Dr. Alvenia Aus for possible intervention  Gloriann Larger, MD FASE [facility] Cardiologist [facility]  [address] [place], [address] [place], [place] [address] [phone]  2:21 PM

## 2023-11-14 NOTE — Telephone Encounter (Signed)
 Patient returned RN's call.

## 2023-11-14 NOTE — Patient Instructions (Signed)
 Medication Instructions:  Your physician has recommended you make the following change in your medication:  INCREASE: carvedilol  (Coreg ) to 12.5 mg by mouth twice daily  *If you need a refill on your cardiac medications before your next appointment, please call your pharmacy*  Lab Work: NONE If you have labs (blood work) drawn today and your tests are completely normal, you will receive your results only by: MyChart Message (if you have MyChart) OR A paper copy in the mail If you have any lab test that is abnormal or we need to change your treatment, we will call you to review the results.  Testing/Procedures: Follow up with Dr. Arida as soon as possible.   Follow-Up:to be determined At [facility], you and your health needs are our priority.  As part of our continuing mission to provide you with exceptional heart care, our providers are all part of one team.  This team includes your primary Cardiologist (physician) and Advanced Practice Providers or APPs (Physician Assistants and Nurse Practitioners) who all work together to provide you with the care you need, when you need it.   Provider:   Jann Melody, MD    We recommend signing up for the patient portal called "MyChart".  Sign up information is provided on this After Visit Summary.  MyChart is used to connect with patients for Virtual Visits (Telemedicine).  Patients are able to view lab/test results, encounter notes, upcoming appointments, etc.  Non-urgent messages can be sent to your provider as well.   To learn more about what you can do with MyChart, go to [email or web address].

## 2023-11-16 DIAGNOSIS — M1711 Unilateral primary osteoarthritis, right knee: Secondary | ICD-10-CM | POA: Diagnosis not present

## 2023-11-30 ENCOUNTER — Encounter: Payer: Self-pay | Admitting: Internal Medicine

## 2023-11-30 ENCOUNTER — Ambulatory Visit: Payer: Medicare Other | Admitting: Internal Medicine

## 2023-11-30 VITALS — BP 148/88 | HR 76 | Ht 61.0 in | Wt 155.0 lb

## 2023-11-30 DIAGNOSIS — E1165 Type 2 diabetes mellitus with hyperglycemia: Secondary | ICD-10-CM

## 2023-11-30 DIAGNOSIS — Z794 Long term (current) use of insulin: Secondary | ICD-10-CM

## 2023-11-30 LAB — POCT GLYCOSYLATED HEMOGLOBIN (HGB A1C): Hemoglobin A1C: 8.7 % — AB (ref 4.0–5.6)

## 2023-11-30 NOTE — Patient Instructions (Addendum)
 Continue Tresiba  60 units ONCE Daily  Continue Ozempic  0.5 mg weekly  Take Humalog  6 units with each meal PLUS add extra if needed depending on the scale  Humalog  correctional insulin : Use the scale below to help guide you before each meal and bedtime   Blood sugar before meal Number of units to inject  Less than 165 0 unit  166 -  200 1 units  201 -  235 2 units  236 -  270 3 units  271 -  305 4 units  306 -  340 5 units  341 -  375 6 units  376 -  410 7 units  411 -  445 8 units       HOW TO TREAT LOW BLOOD SUGARS (Blood sugar LESS THAN 70 MG/DL) Please follow the RULE OF 15 for the treatment of hypoglycemia treatment (when your (blood sugars are less than 70 mg/dL)   STEP 1: Take 15 grams of carbohydrates when your blood sugar is low, which includes:  3-4 GLUCOSE TABS  OR 3-4 OZ OF JUICE OR REGULAR SODA OR ONE TUBE OF GLUCOSE GEL    STEP 2: RECHECK blood sugar in 15 MINUTES STEP 3: If your blood sugar is still low at the 15 minute recheck --> then, go back to STEP 1 and treat AGAIN with another 15 grams of carbohydrates.

## 2023-11-30 NOTE — Progress Notes (Signed)
 Name: Annette Suarez  Age/ Sex: 83 y.o., female   MRN/ DOB: [id number], 23-Apr-1941     PCP: Arnetha Bhat, NP   Reason for Endocrinology Evaluation: Type 2 Diabetes Mellitus  Initial Endocrine Consultative Visit: 05/11/2021    PATIENT IDENTIFIER: Annette Suarez is a 83 y.o. female with a past medical history of T2DM, HTN, RA and dyslipidemia. The patient has followed with Endocrinology clinic since 05/11/2021 for consultative assistance with management of her diabetes.  DIABETIC HISTORY:  Ms. Worst was diagnosed with DM in ~ 1995 . She is intolerant to Metformin- nausea and Jardiance due to UTI's .Her hemoglobin A1c has ranged from 7.5% in 2021, peaking at 8.6% in 2023.  Transitioned care from Dr. Washington Hacker 08/2021  Started trulicity  in 05/2021  THYROID  HISTORY : She has been diagnosed with MNG based on thyroid  ultrasound in 2011 showing multiple thyroid  nodules. She is s/p benign cytology of the left middle and left inferior nodules in 2011. Ultrasound in 2021 confirmed at least a 5 year stability    SUBJECTIVE:   During the last visit (08/29/2023): A1c 8.7 %     Today (11/30/2023): [name] is here to establish care for diabetes. She has been checking blood sugars multiple times daily through CGM. The patient has  had hypoglycemic episodes since the last clinic visit. She is symptomatic with these episodes.   Patient follows with cardiology for subclavian artery stenosis, intervention was recommended She continues to follow-up with rheumatology (Dr. Georgiann Kirsch) for seronegative rheumatoid arthritis, and osteoarthritis   Continues with positional dizziness  Continued with occasional  nausea  Denies constipation or diarrhea    H/OME DIABETES REGIMEN:  Tresiba  60 units daily  Ozempic  0.5 mg weekly ( Weekly)  Humalog  4 units TIDQAC CF: Humalog  (BG-130/35)    Statin: yes ACE-I/ARB: yes     CONTINUOUS GLUCOSE MONITORING RECORD INTERPRETATION    Dates of  Recording:5/10-5/23/2025  Sensor description:freestyle libre  Results statistics:   CGM use % of time 93  Average and SD 214/32.2  Time in range 34%  % Time Above 180 35  % Time above 250 31  % Time Below target 0     Glycemic patterns summary: BG's trend down over night and increased throughout the day   Hyperglycemic episodes mostly postprandial  Hypoglycemic episodes occurred rarely overnight  Overnight periods: Trends down   DIABETIC COMPLICATIONS: Microvascular complications:  CKD III Denies: retinopathy, neuropathy  Last Eye Exam: Completed 07/2021  Macrovascular complications:   Denies: CAD, CVA, PVD   HISTORY:  Past Medical History:  Past Medical History:  Diagnosis Date   Acute cystitis    UTI   Anxiety    Arthritis    Benign positional vertigo    Dermatitis    Diabetes mellitus without complication (HCC)    Family history of adverse reaction to anesthesia    GERD (gastroesophageal reflux disease)    Goiter, nontoxic, multinodular    Gout    H/O: hysterectomy    History of tobacco use    Hx laparoscopic cholecystectomy    Hypercholesterolemia    Hypertension    Incomplete bladder emptying    Insomnia    Nausea & vomiting 09/08/2017   Postmenopausal atrophic vaginitis    RA (rheumatoid arthritis) (HCC)    Rectocele    Renal calculus    Skin cancer    Sleep apnea    Thyroid  condition    Type 2 diabetes mellitus (HCC)    Urolithiasis  Vaginal enterocele    Past Surgical History:  Past Surgical History:  Procedure Laterality Date   AORTIC ARCH ANGIOGRAPHY N/A 10/22/2022   Procedure: AORTIC ARCH ANGIOGRAPHY;  Surgeon: Lucendia Rusk, MD;  Location: [facility] INVASIVE CV LAB;  Service: Cardiovascular;  Laterality: N/A;   CARPAL TUNNEL RELEASE  2018   CATARACT EXTRACTION, BILATERAL     CHOLECYSTECTOMY     COLONOSCOPY     07/11/2009, 02/2015   GALLBLADDER SURGERY     KNEE ARTHROSCOPY Right 05/20/2020   Procedure: RIGHT KNEE ARTHROSCOPY  AND DEBRIDEMENT;  Surgeon: Timothy Ford, MD;  Location: [facility];  Service: Orthopedics;  Laterality: Right;   PARTIAL HYSTERECTOMY     Overies left intact   RIGHT/LEFT HEART CATH AND CORONARY ANGIOGRAPHY N/A 10/22/2022   Procedure: RIGHT/LEFT HEART CATH AND CORONARY ANGIOGRAPHY;  Surgeon: Lucendia Rusk, MD;  Location: [facility] INVASIVE CV LAB;  Service: Cardiovascular;  Laterality: N/A;   SQUAMOUS CELL CARCINOMA EXCISION  05/2018   Social History:  reports that she quit smoking about 45 years ago. Her smoking use included cigarettes. She started smoking about 55 years ago. She has a 2 pack-year smoking history. She has been exposed to tobacco smoke. She has never used smokeless tobacco. She reports that she does not drink alcohol and does not use drugs. Family History:  Family History  Problem Relation Age of Onset   Diabetes Mother    Heart disease Father 108   Diabetes Son    Crohn's disease Son      HOME MEDICATIONS: Allergies as of 11/30/2023       Reactions   Latex Rash   Amaryl [glimepiride]    Don't Remember   Amlodipine     LE edema on 7.5mg  daily. Tolerates 5mg  daily just fine   Codeine    SICK   Jardiance [empagliflozin]    Urinary tract infection- SIDE EFFECT   Pneumovax 23 [pneumococcal Vac Polyvalent]    Red / Swelling / Hot to touch SIDE EFFECTS   Wellbutrin [bupropion]    Unknown        Medication List        Accurate as of Nov 30, 2023 10:16 AM. If you have any questions, ask your nurse or doctor.          Accu-Chek Softclix Lancets lancets Use to test blood sugar three times daily. Dx: E11.8   aspirin  EC 81 MG tablet Take 81 mg by mouth daily.   Biotin 1 MG Caps Take 1 mg by mouth.   carvedilol  12.5 MG tablet Commonly known as: COREG  Take 1 tablet (12.5 mg total) by mouth 2 (two) times daily.   cetirizine  10 MG tablet Commonly known as: ZYRTEC  Take 1 tablet (10 mg total) by mouth daily.   chlorthalidone  25 MG  tablet Commonly known as: HYGROTON  TAKE 1 TABLET BY MOUTH DAILY   clobetasol cream 0.05 % Commonly known as: TEMOVATE Apply 1 Application topically as needed.   diazepam  5 MG tablet Commonly known as: VALIUM  as needed for anxiety.   Enbrel  SureClick 50 MG/ML injection Generic drug: etanercept  Inject 50 mg into the skin once a week.   estradiol  0.1 MG/GM vaginal cream Commonly known as: ESTRACE  Place 1 Applicatorful vaginally 3 (three) times a week.   fluocinonide  0.05 % external solution Commonly known as: LIDEX  Apply 1 Application topically daily as needed.   FreeStyle [employer] 2 Reader [employer] 2 Sensor Misc APPLY SENSOR TO SKIN FOR CONTINUOUS  BLOOD SUGAR MONITORING, REPLACE EVERY 14 DAYS   insulin  lispro 100 UNIT/ML KwikPen Commonly known as: HumaLOG  KwikPen Start CF: Humalog  (BG-130/35) TIDQAC and at bedtime  Max daily 30 units   Insulin  Pen Needle 31G X 5 MM Misc 1 Device by Does not apply route in the morning, at noon, in the evening, and at bedtime.   irbesartan  300 MG tablet Commonly known as: AVAPRO  Take 1 tablet (300 mg total) by mouth daily.   loperamide 2 MG tablet Commonly known as: IMODIUM A-D Take 2 mg by mouth daily as needed for diarrhea or loose stools.   LORazepam  0.5 MG tablet Commonly known as: ATIVAN  Take 1 tablet (0.5 mg total) by mouth at bedtime.   MAGNESIUM CITRATE PO Take 250 mg by mouth daily as needed (leg cramps).   MELATONIN PO Take 10 mg by mouth at bedtime as needed (Sleep).   methenamine 1 g tablet Commonly known as: HIPREX Take 1 g by mouth 2 (two) times daily with a meal.   nitroGLYCERIN  0.4 MG SL tablet Commonly known as: NITROSTAT  Place 1 tablet (0.4 mg total) under the tongue every 5 (five) minutes as needed for chest pain.   omeprazole 20 MG Tbdd disintegrating tablet Take 20 mg by mouth daily.   ondansetron  4 MG tablet Commonly known as: ZOFRAN  TAKE 1 TABLET BY MOUTH 3 TIMES A DAY AS NEEDED FOR  NAUSEA AND/OR VOMITING   Ozempic  (0.25 or 0.5 MG/DOSE) 2 MG/1.5ML Sopn Generic drug: Semaglutide (0.25 or 0.5MG /DOS) Inject 0.5 mg into the skin once a week.   Premarin vaginal cream Generic drug: conjugated estrogens 2 (two) times a week.   PROBIOTIC PO Take 1 tablet by mouth daily.   simvastatin  20 MG tablet Commonly known as: ZOCOR  TAKE ONE TABLET BY MOUTH EVERY EVENING   SYSTANE COMPLETE OP Place 1-2 drops into both eyes daily as needed (Dry eye).   Tresiba  FlexTouch 200 UNIT/ML FlexTouch Pen Generic drug: insulin  degludec Inject 60 Units into the skin daily in the afternoon.   Vitamin D3 50 MCG (2000 UT) capsule Take 2,000 Units by mouth daily.   VOLTAREN EX Apply 1 application  topically 3 (three) times daily as needed (Arthritis).         OBJECTIVE:   Vital Signs: BP (!) 148/88 (BP Location: Right Arm, Patient Position: Sitting, Cuff Size: Normal)   Pulse 76   Ht 5\' 1"  (1.549 m)   Wt 155 lb (70.3 kg)   SpO2 97%   BMI 29.29 kg/m   Wt Readings from Last 3 Encounters:  11/30/23 155 lb (70.3 kg)  11/14/23 156 lb (70.8 kg)  10/31/23 155 lb 9.6 oz (70.6 kg)     Exam: General: Pt appears well and is in NAD  Neck: General: Supple without adenopathy. Thyroid : Thyroid  is prominent on the right  Lungs: Clear with good BS bilat   Heart: RRR with norm   Extremities: No pretibial edema.  Neuro: MS is good with appropriate affect, pt is alert and Ox3   DM Foot Exam 08/29/2023  The skin of the feet is intact without sores or ulcerations. The pedal pulses are 2+ on right and 2+ on left. The sensation is decreased  to a screening 5.07, 10 gram monofilament bilaterally    DATA REVIEWED:  Lab Results  Component Value Date   HGBA1C 8.7 (A) 08/29/2023   HGBA1C 9.2 (A) 03/22/2023   HGBA1C 7.8 (A) 09/10/2022     Latest Reference Range & Units 10/31/23 11:03  Sodium 135 - 146 mmol/L 138  Potassium 3.5 - 5.3 mmol/L 4.6  Chloride 98 - 110 mmol/L 99  CO2 20 -  32 mmol/L 31  Glucose 65 - 99 mg/dL 161 (H)  BUN 7 - 25 mg/dL 26 (H)  Creatinine 0.96 - 0.95 mg/dL 0.45 (H)  Calcium  8.6 - 10.4 mg/dL 9.5  BUN/Creatinine Ratio 6 - 22 (calc) 23 (H)  eGFR > OR = 60 mL/min/1.82m2 49 (L)  AG Ratio 1.0 - 2.5 (calc) 1.8  AST 10 - 35 U/L 11  ALT 6 - 29 U/L 9  Total Protein 6.1 - 8.1 g/dL 6.9  Total Bilirubin 0.2 - 1.2 mg/dL 1.2      ASSESSMENT / PLAN / RECOMMENDATIONS:   1) Type 2 Diabetes Mellitus, Poorly  controlled, With CKD III and neuropathic complications - Most recent A1c of 8.7 %. Goal A1c < 7.5 %.    -A1c continues to be above goal of 8.7%, unfortunately adding a standing dose of 4 units of Humalog  with each meal has not improved her A1c at all? - She is on pt assistance for Ozempic  - Limited exercise due to knee pains - She is intolerant to Jardiance  - I again emphasized the importance of taking Humalog  before each meal plus adding sliding scale if needed - I will increase prandial insulin  as below - Patient unable to increase dose of Ozempic  - We discussed the importance of optimizing glucose control prior to pending vascular surgery to decrease the risk of infection and to improve healing  MEDICATIONS: Continue  Ozempic  0.5 mg weekly(samples) Continue Tresiba  60 units daily Increase Humalog  6 units with each meal  Continue CF: Humalog  (BG-130/35) TIDQAC and QHS  EDUCATION / INSTRUCTIONS: BG monitoring instructions: Patient is instructed to check her blood sugars 2 times a day, fasting and suppertime. Call [facility] if: BG persistently < 70  I reviewed the Rule of 15 for the treatment of hypoglycemia in detail with the patient. Literature supplied.   2) Diabetic complications:  Eye: Does not have known diabetic retinopathy.  Neuro/ Feet: Does have known diabetic peripheral neuropathy .  Renal: Patient does  have known baseline CKD. She   is  on an ACEI/ARB at present.    3) Dyslipidemia :   -  LDL  has been  at goal , no change    Medication  Simvastatin  20 mg daily     F/U in 6 months         Signed electronically by: Natale Bail, MD  [facility]  [facility] [address] [place] [place], [place] [address] Phone: [phone] FAX: [phone]   CC: Arnetha Bhat, NP [address] [place] [address] Phone: [phone]  Fax: [phone]  Return to Endocrinology clinic as below: Future Appointments  Date Time Provider Department Center  12/13/2023  8:00 AM Wenona Hamilton, MD [facility]  04/03/2024 11:20 AM Nicholas Bari, MD [facility] None  05/24/2024 10:40 AM Arnetha Bhat, NP [facility] None

## 2023-12-01 ENCOUNTER — Encounter: Payer: Self-pay | Admitting: Internal Medicine

## 2023-12-12 NOTE — H&P (View-Only) (Signed)
 Cardiology Office Note   Date:  12/12/2023   ID:  Annette Suarez, DOB [birth year], MRN [id number]  PCP:  Arnetha Bhat, NP  Cardiologist: Dr. Paulita Boss  No chief complaint on file.     History of Present Illness: Annette Suarez is a 83 y.o. female who was referred by Dr. Paulita Boss for evaluation management of left subclavian artery stenosis.  She has known history of diabetes mellitus, essential hypertension, hyperlipidemia and obstructive sleep apnea. She and her husband noticed discrepancy in blood pressure readings between the right and left arm for many years.  She is right-handed and does not use her left hand that much.  She denies any left arm claudication.  No symptoms of dizziness, nausea or poor balance with using left arm.  She denies chest pain or shortness of breath.  No leg claudication.  She had recent cardiac studies which included an echocardiogram.  This showed hyperdynamic LV systolic function with no significant valvular abnormalities.  Carotid Doppler showed moderate left carotid stenosis.  Left subclavian artery was noted to be stenosed with retrograde flow in the left vertebral artery.  CTA of the chest and aorta was done in January which showed severe stenosis of the proximal left subclavian artery.  Past Medical History:  Diagnosis Date   Acute cystitis    UTI   Anxiety    Arthritis    Benign positional vertigo    Dermatitis    Diabetes mellitus without complication (HCC)    Family history of adverse reaction to anesthesia    GERD (gastroesophageal reflux disease)    Goiter, nontoxic, multinodular    Gout    H/O: hysterectomy    History of tobacco use    Hx laparoscopic cholecystectomy    Hypercholesterolemia    Hypertension    Incomplete bladder emptying    Insomnia    Nausea & vomiting 09/08/2017   Postmenopausal atrophic vaginitis    RA (rheumatoid arthritis) (HCC)    Rectocele    Renal calculus    Skin cancer    Sleep apnea    Thyroid   condition    Type 2 diabetes mellitus (HCC)    Urolithiasis    Vaginal enterocele     Past Surgical History:  Procedure Laterality Date   AORTIC ARCH ANGIOGRAPHY N/A 10/22/2022   Procedure: AORTIC ARCH ANGIOGRAPHY;  Surgeon: Lucendia Rusk, MD;  Location: [facility] INVASIVE CV LAB;  Service: Cardiovascular;  Laterality: N/A;   CARPAL TUNNEL RELEASE  2018   CATARACT EXTRACTION, BILATERAL     CHOLECYSTECTOMY     COLONOSCOPY     07/11/2009, 02/2015   GALLBLADDER SURGERY     KNEE ARTHROSCOPY Right 05/20/2020   Procedure: RIGHT KNEE ARTHROSCOPY AND DEBRIDEMENT;  Surgeon: Timothy Ford, MD;  Location: [facility];  Service: Orthopedics;  Laterality: Right;   PARTIAL HYSTERECTOMY     Overies left intact   RIGHT/LEFT HEART CATH AND CORONARY ANGIOGRAPHY N/A 10/22/2022   Procedure: RIGHT/LEFT HEART CATH AND CORONARY ANGIOGRAPHY;  Surgeon: Lucendia Rusk, MD;  Location: [facility] INVASIVE CV LAB;  Service: Cardiovascular;  Laterality: N/A;   SQUAMOUS CELL CARCINOMA EXCISION  05/2018     Current Outpatient Medications  Medication Sig Dispense Refill   Accu-Chek Softclix Lancets lancets Use to test blood sugar three times daily. Dx: E11.8 300 each 1   aspirin  EC 81 MG tablet Take 81 mg by mouth daily.     Biotin 1 MG CAPS Take 1 mg by  mouth.     carvedilol  (COREG ) 12.5 MG tablet Take 1 tablet (12.5 mg total) by mouth 2 (two) times daily. 180 tablet 3   cetirizine  (ZYRTEC ) 10 MG tablet Take 1 tablet (10 mg total) by mouth daily. 30 tablet 11   chlorthalidone  (HYGROTON ) 25 MG tablet TAKE 1 TABLET BY MOUTH DAILY 90 tablet 3   Cholecalciferol (VITAMIN D3) 50 MCG (2000 UT) capsule Take 2,000 Units by mouth daily.     clobetasol cream (TEMOVATE) 0.05 % Apply 1 Application topically as needed.     conjugated estrogens (PREMARIN) vaginal cream 2 (two) times a week.     Continuous Blood Gluc Receiver (FREESTYLE LIBRE 2 READER) DEVI      Continuous Glucose Sensor (FREESTYLE LIBRE 2 SENSOR)  MISC APPLY SENSOR TO SKIN FOR CONTINUOUS BLOOD SUGAR MONITORING, REPLACE EVERY 14 DAYS 2 each 11   diazepam  (VALIUM ) 5 MG tablet as needed for anxiety.     Diclofenac Sodium (VOLTAREN EX) Apply 1 application  topically 3 (three) times daily as needed (Arthritis).     estradiol  (ESTRACE ) 0.1 MG/GM vaginal cream Place 1 Applicatorful vaginally 3 (three) times a week. 42.5 g 0   etanercept  (ENBREL  SURECLICK) 50 MG/ML injection Inject 50 mg into the skin once a week. 12 mL 0   fluocinonide  (LIDEX ) 0.05 % external solution Apply 1 Application topically daily as needed. 60 mL 3   insulin  degludec (TRESIBA  FLEXTOUCH) 200 UNIT/ML FlexTouch Pen Inject 60 Units into the skin daily in the afternoon. 60 mL 6   insulin  lispro (HUMALOG  KWIKPEN) 100 UNIT/ML KwikPen Start CF: Humalog  (BG-130/35) TIDQAC and at bedtime  Max daily 30 units 30 mL 3   Insulin  Pen Needle 31G X 5 MM MISC 1 Device by Does not apply route in the morning, at noon, in the evening, and at bedtime. 400 each 3   irbesartan  (AVAPRO ) 300 MG tablet Take 1 tablet (300 mg total) by mouth daily. 90 tablet 3   loperamide (IMODIUM A-D) 2 MG tablet Take 2 mg by mouth daily as needed for diarrhea or loose stools.     LORazepam  (ATIVAN ) 0.5 MG tablet Take 1 tablet (0.5 mg total) by mouth at bedtime. 30 tablet 5   MAGNESIUM CITRATE PO Take 250 mg by mouth daily as needed (leg cramps).     MELATONIN PO Take 10 mg by mouth at bedtime as needed (Sleep).     methenamine (HIPREX) 1 g tablet Take 1 g by mouth 2 (two) times daily with a meal.     nitroGLYCERIN  (NITROSTAT ) 0.4 MG SL tablet Place 1 tablet (0.4 mg total) under the tongue every 5 (five) minutes as needed for chest pain. 25 tablet 3   Omeprazole 20 MG TBDD Take 20 mg by mouth daily.     ondansetron  (ZOFRAN ) 4 MG tablet TAKE 1 TABLET BY MOUTH 3 TIMES A DAY AS NEEDED FOR NAUSEA AND/OR VOMITING 20 tablet 0   Probiotic Product (PROBIOTIC PO) Take 1 tablet by mouth daily.     Propylene Glycol (SYSTANE  COMPLETE OP) Place 1-2 drops into both eyes daily as needed (Dry eye).     Semaglutide ,0.25 or 0.5MG /DOS, (OZEMPIC , 0.25 OR 0.5 MG/DOSE,) 2 MG/1.5ML SOPN Inject 0.5 mg into the skin once a week. 3 mL 0   simvastatin  (ZOCOR ) 20 MG tablet TAKE ONE TABLET BY MOUTH EVERY EVENING 90 tablet 1   No current facility-administered medications for this visit.    Allergies:   Latex, Amaryl [glimepiride], Amlodipine , Codeine, Jardiance [  empagliflozin], Pneumovax 23 [pneumococcal vac polyvalent], and Wellbutrin [bupropion]    Social History:  The patient  reports that she quit smoking about 45 years ago. Her smoking use included cigarettes. She started smoking about 55 years ago. She has a 2 pack-year smoking history. She has been exposed to tobacco smoke. She has never used smokeless tobacco. She reports that she does not drink alcohol and does not use drugs.   Family History:  The patient's family history includes Crohn's disease in her son; Diabetes in her mother and son; Heart disease (age of onset: 65) in her father.    ROS:  Please see the history of present illness.   Otherwise, review of systems are positive for none.   All other systems are reviewed and negative.    PHYSICAL EXAM: VS:  There were no vitals taken for this visit. , BMI There is no height or weight on file to calculate BMI. GEN: Well nourished, well developed, in no acute distress  HEENT: normal  Neck: no JVD,  or masses.  Right carotid bruit.  There is a bruit in the left subclavian area. Cardiac: RRR; no  rubs, or gallops,no edema .  1 out of 6 systolic murmur in the aortic area Respiratory:  clear to auscultation bilaterally, normal work of breathing GI: soft, nontender, nondistended, + BS MS: no deformity or atrophy  Skin: warm and dry, no rash Neuro:  Strength and sensation are intact Psych: euthymic mood, full affect Vascular: Radial pulses +2 on the right and +1 on the left.  Dorsalis pedis is +2 bilaterally.   EKG:   EKG is not ordered today.    Recent Labs: 08/18/2023: TSH 2.16 10/31/2023: ALT 9; BUN 26; Creat 1.14; Hemoglobin 12.9; Platelets 290; Potassium 4.6; Sodium 138    Lipid Panel    Component Value Date/Time   CHOL 153 04/14/2023 0944   TRIG 195 (H) 04/14/2023 0944   HDL 38 (L) 04/14/2023 0944   CHOLHDL 4.0 04/14/2023 0944   LDLCALC 86 04/14/2023 0944      Wt Readings from Last 3 Encounters:  11/30/23 155 lb (70.3 kg)  11/14/23 156 lb (70.8 kg)  10/31/23 155 lb 9.6 oz (70.6 kg)            No data to display            ASSESSMENT AND PLAN:  1.  Left subclavian artery stenosis: The patient does have evidence of left subclavian artery stenosis based on exam showing a 20 mm systolic gradient between the right and left arm, diminished left radial pulse and also by duplex and Doppler which showed retrograde flow in the left vertebral artery.  However, the patient has no clinical symptoms related to this at all.  She has no left arm claudication.  In addition, in spite of Doppler evidence of subclavian steal, she does not have clinical symptoms related to this.  Based on this, there is no indication for revascularization. Recommend checking blood pressure in the right arm as this better reflect central aortic pressure. Continue low-dose aspirin  and recommend aggressive treatment of risk factors.  2.  Essential hypertension: Blood pressure is mildly elevated in the right arm.  She was recently started on amlodipine  but has been taking the medication only every other day because she was concerned about frequent urination.  I explained to her that amlodipine  is not a diuretic and she should take the medication daily.  She should address frequent urination with her primary care  physician.  3.  Hyperlipidemia: She is currently on simvastatin .  Lipid profile last year showed an LDL of 55.    Disposition:   FU with me as needed if she develops symptoms related to her left subclavian  artery stenosis.  Signed,  Antionette Kirks, MD  12/12/2023 6:08 PM    [facility]

## 2023-12-12 NOTE — Progress Notes (Unsigned)
 Cardiology Office Note   Date:  12/12/2023   ID:  Annette Suarez, DOB [birth year], MRN [id number]  PCP:  Arnetha Bhat, NP  Cardiologist: Dr. Paulita Boss  No chief complaint on file.     History of Present Illness: Annette Suarez is a 83 y.o. female who was referred by Dr. Paulita Boss for evaluation management of left subclavian artery stenosis.  She has known history of diabetes mellitus, essential hypertension, hyperlipidemia and obstructive sleep apnea. She and her husband noticed discrepancy in blood pressure readings between the right and left arm for many years.  She is right-handed and does not use her left hand that much.  She denies any left arm claudication.  No symptoms of dizziness, nausea or poor balance with using left arm.  She denies chest pain or shortness of breath.  No leg claudication.  She had recent cardiac studies which included an echocardiogram.  This showed hyperdynamic LV systolic function with no significant valvular abnormalities.  Carotid Doppler showed moderate left carotid stenosis.  Left subclavian artery was noted to be stenosed with retrograde flow in the left vertebral artery.  CTA of the chest and aorta was done in January which showed severe stenosis of the proximal left subclavian artery.  Past Medical History:  Diagnosis Date   Acute cystitis    UTI   Anxiety    Arthritis    Benign positional vertigo    Dermatitis    Diabetes mellitus without complication (HCC)    Family history of adverse reaction to anesthesia    GERD (gastroesophageal reflux disease)    Goiter, nontoxic, multinodular    Gout    H/O: hysterectomy    History of tobacco use    Hx laparoscopic cholecystectomy    Hypercholesterolemia    Hypertension    Incomplete bladder emptying    Insomnia    Nausea & vomiting 09/08/2017   Postmenopausal atrophic vaginitis    RA (rheumatoid arthritis) (HCC)    Rectocele    Renal calculus    Skin cancer    Sleep apnea    Thyroid   condition    Type 2 diabetes mellitus (HCC)    Urolithiasis    Vaginal enterocele     Past Surgical History:  Procedure Laterality Date   AORTIC ARCH ANGIOGRAPHY N/A 10/22/2022   Procedure: AORTIC ARCH ANGIOGRAPHY;  Surgeon: Lucendia Rusk, MD;  Location: [facility];  Service: Cardiovascular;  Laterality: N/A;   CARPAL TUNNEL RELEASE  2018   CATARACT EXTRACTION, BILATERAL     CHOLECYSTECTOMY     COLONOSCOPY     07/11/2009, 02/2015   GALLBLADDER SURGERY     KNEE ARTHROSCOPY Right 05/20/2020   Procedure: RIGHT KNEE ARTHROSCOPY AND DEBRIDEMENT;  Surgeon: Timothy Ford, MD;  Location: [facility];  Service: Orthopedics;  Laterality: Right;   PARTIAL HYSTERECTOMY     [place] left intact   RIGHT/LEFT HEART CATH AND CORONARY ANGIOGRAPHY N/A 10/22/2022   Procedure: RIGHT/LEFT HEART CATH AND CORONARY ANGIOGRAPHY;  Surgeon: Lucendia Rusk, MD;  Location: [facility];  Service: Cardiovascular;  Laterality: N/A;   SQUAMOUS CELL CARCINOMA EXCISION  05/2018     Current Outpatient Medications  Medication Sig Dispense Refill   Accu-Chek Softclix Lancets lancets Use to test blood sugar three times daily. Dx: E11.8 300 each 1   aspirin  EC 81 MG tablet Take 81 mg by mouth daily.     Biotin 1 MG CAPS Take 1 mg by  mouth.     carvedilol  (COREG ) 12.5 MG tablet Take 1 tablet (12.5 mg total) by mouth 2 (two) times daily. 180 tablet 3   cetirizine  (ZYRTEC ) 10 MG tablet Take 1 tablet (10 mg total) by mouth daily. 30 tablet 11   chlorthalidone  (HYGROTON ) 25 MG tablet TAKE 1 TABLET BY MOUTH DAILY 90 tablet 3   Cholecalciferol (VITAMIN D3) 50 MCG (2000 UT) capsule Take 2,000 Units by mouth daily.     clobetasol cream (TEMOVATE) 0.05 % Apply 1 Application topically as needed.     conjugated estrogens (PREMARIN) vaginal cream 2 (two) times a week.     Continuous Blood Gluc Receiver (FREESTYLE LIBRE 2 READER) DEVI      Continuous Glucose Sensor (FREESTYLE LIBRE 2 SENSOR)  MISC APPLY SENSOR TO SKIN FOR CONTINUOUS BLOOD SUGAR MONITORING, REPLACE EVERY 14 DAYS 2 each 11   diazepam  (VALIUM ) 5 MG tablet as needed for anxiety.     Diclofenac Sodium (VOLTAREN EX) Apply 1 application  topically 3 (three) times daily as needed (Arthritis).     estradiol  (ESTRACE ) 0.1 MG/GM vaginal cream Place 1 Applicatorful vaginally 3 (three) times a week. 42.5 g 0   etanercept  (ENBREL  SURECLICK) 50 MG/ML injection Inject 50 mg into the skin once a week. 12 mL 0   fluocinonide  (LIDEX ) 0.05 % external solution Apply 1 Application topically daily as needed. 60 mL 3   insulin  degludec (TRESIBA  FLEXTOUCH) 200 UNIT/ML FlexTouch Pen Inject 60 Units into the skin daily in the afternoon. 60 mL 6   insulin  lispro (HUMALOG  KWIKPEN) 100 UNIT/ML KwikPen Start CF: Humalog  (BG-130/35) TIDQAC and at bedtime  Max daily 30 units 30 mL 3   Insulin  Pen Needle 31G X 5 MM MISC 1 Device by Does not apply route in the morning, at noon, in the evening, and at bedtime. 400 each 3   irbesartan  (AVAPRO ) 300 MG tablet Take 1 tablet (300 mg total) by mouth daily. 90 tablet 3   loperamide (IMODIUM A-D) 2 MG tablet Take 2 mg by mouth daily as needed for diarrhea or loose stools.     LORazepam  (ATIVAN ) 0.5 MG tablet Take 1 tablet (0.5 mg total) by mouth at bedtime. 30 tablet 5   MAGNESIUM CITRATE PO Take 250 mg by mouth daily as needed (leg cramps).     MELATONIN PO Take 10 mg by mouth at bedtime as needed (Sleep).     methenamine (HIPREX) 1 g tablet Take 1 g by mouth 2 (two) times daily with a meal.     nitroGLYCERIN  (NITROSTAT ) 0.4 MG SL tablet Place 1 tablet (0.4 mg total) under the tongue every 5 (five) minutes as needed for chest pain. 25 tablet 3   Omeprazole 20 MG TBDD Take 20 mg by mouth daily.     ondansetron  (ZOFRAN ) 4 MG tablet TAKE 1 TABLET BY MOUTH 3 TIMES A DAY AS NEEDED FOR NAUSEA AND/OR VOMITING 20 tablet 0   Probiotic Product (PROBIOTIC PO) Take 1 tablet by mouth daily.     Propylene Glycol (SYSTANE  COMPLETE OP) Place 1-2 drops into both eyes daily as needed (Dry eye).     Semaglutide ,0.25 or 0.5MG /DOS, (OZEMPIC , 0.25 OR 0.5 MG/DOSE,) 2 MG/1.5ML SOPN Inject 0.5 mg into the skin once a week. 3 mL 0   simvastatin  (ZOCOR ) 20 MG tablet TAKE ONE TABLET BY MOUTH EVERY EVENING 90 tablet 1   No current facility-administered medications for this visit.    Allergies:   Latex, Amaryl [glimepiride], Amlodipine , Codeine, Jardiance [  empagliflozin], Pneumovax 23 [pneumococcal vac polyvalent], and Wellbutrin [bupropion]    Social History:  The patient  reports that she quit smoking about 45 years ago. Her smoking use included cigarettes. She started smoking about 55 years ago. She has a 2 pack-year smoking history. She has been exposed to tobacco smoke. She has never used smokeless tobacco. She reports that she does not drink alcohol and does not use drugs.   Family History:  The patient's family history includes Crohn's disease in her son; Diabetes in her mother and son; Heart disease (age of onset: 65) in her father.    ROS:  Please see the history of present illness.   Otherwise, review of systems are positive for none.   All other systems are reviewed and negative.    PHYSICAL EXAM: VS:  There were no vitals taken for this visit. , BMI There is no height or weight on file to calculate BMI. GEN: Well nourished, well developed, in no acute distress  HEENT: normal  Neck: no JVD,  or masses.  Right carotid bruit.  There is a bruit in the left subclavian area. Cardiac: RRR; no  rubs, or gallops,no edema .  1 out of 6 systolic murmur in the aortic area Respiratory:  clear to auscultation bilaterally, normal work of breathing GI: soft, nontender, nondistended, + BS MS: no deformity or atrophy  Skin: warm and dry, no rash Neuro:  Strength and sensation are intact Psych: euthymic mood, full affect Vascular: Radial pulses +2 on the right and +1 on the left.  Dorsalis pedis is +2 bilaterally.   EKG:   EKG is not ordered today.    Recent Labs: 08/18/2023: TSH 2.16 10/31/2023: ALT 9; BUN 26; Creat 1.14; Hemoglobin 12.9; Platelets 290; Potassium 4.6; Sodium 138    Lipid Panel    Component Value Date/Time   CHOL 153 04/14/2023 0944   TRIG 195 (H) 04/14/2023 0944   HDL 38 (L) 04/14/2023 0944   CHOLHDL 4.0 04/14/2023 0944   LDLCALC 86 04/14/2023 0944      Wt Readings from Last 3 Encounters:  11/30/23 155 lb (70.3 kg)  11/14/23 156 lb (70.8 kg)  10/31/23 155 lb 9.6 oz (70.6 kg)            No data to display            ASSESSMENT AND PLAN:  1.  Left subclavian artery stenosis: The patient does have evidence of left subclavian artery stenosis based on exam showing a 20 mm systolic gradient between the right and left arm, diminished left radial pulse and also by duplex and Doppler which showed retrograde flow in the left vertebral artery.  However, the patient has no clinical symptoms related to this at all.  She has no left arm claudication.  In addition, in spite of Doppler evidence of subclavian steal, she does not have clinical symptoms related to this.  Based on this, there is no indication for revascularization. Recommend checking blood pressure in the right arm as this better reflect central aortic pressure. Continue low-dose aspirin  and recommend aggressive treatment of risk factors.  2.  Essential hypertension: Blood pressure is mildly elevated in the right arm.  She was recently started on amlodipine  but has been taking the medication only every other day because she was concerned about frequent urination.  I explained to her that amlodipine  is not a diuretic and she should take the medication daily.  She should address frequent urination with her primary care  physician.  3.  Hyperlipidemia: She is currently on simvastatin .  Lipid profile last year showed an LDL of 55.    Disposition:   FU with me as needed if she develops symptoms related to her left subclavian  artery stenosis.  Signed,  Antionette Kirks, MD  12/12/2023 6:08 PM    [facility]

## 2023-12-13 ENCOUNTER — Ambulatory Visit: Attending: Cardiovascular Disease | Admitting: Cardiovascular Disease

## 2023-12-13 ENCOUNTER — Encounter: Payer: Self-pay | Admitting: Cardiovascular Disease

## 2023-12-13 VITALS — BP 130/70 | HR 80 | Ht 61.0 in | Wt 156.6 lb

## 2023-12-13 DIAGNOSIS — I771 Stricture of artery: Secondary | ICD-10-CM

## 2023-12-13 DIAGNOSIS — I10 Essential (primary) hypertension: Secondary | ICD-10-CM

## 2023-12-13 DIAGNOSIS — I25118 Atherosclerotic heart disease of native coronary artery with other forms of angina pectoris: Secondary | ICD-10-CM | POA: Diagnosis not present

## 2023-12-13 DIAGNOSIS — I2510 Atherosclerotic heart disease of native coronary artery without angina pectoris: Secondary | ICD-10-CM

## 2023-12-13 DIAGNOSIS — E785 Hyperlipidemia, unspecified: Secondary | ICD-10-CM | POA: Diagnosis not present

## 2023-12-13 LAB — CBC

## 2023-12-13 NOTE — Patient Instructions (Signed)
 Medication Instructions:  No changes *If you need a refill on your cardiac medications before your next appointment, please call your pharmacy*  Lab Work: Your provider would like for you to have the following labs today: CBC and BMET  If you have labs (blood work) drawn today and your tests are completely normal, you will receive your results only by: MyChart Message (if you have MyChart) OR A paper copy in the mail If you have any lab test that is abnormal or we need to change your treatment, we will call you to review the results.  Testing/Procedures: Your physician has requested that you have a peripheral vascular angiogram. This exam is performed at the hospital. During this exam IV contrast is used to look at arterial blood flow. Please review the information sheet given for details.   Follow-Up: At [facility], you and your health needs are our priority.  As part of our continuing mission to provide you with exceptional heart care, our providers are all part of one team.  This team includes your primary Cardiologist (physician) and Advanced Practice Providers or APPs (Physician Assistants and Nurse Practitioners) who all work together to provide you with the care you need, when you need it.  Your next appointment:   Keep your post procedure follow up on 7/15 at 9:15  We recommend signing up for the patient portal called "MyChart".  Sign up information is provided on this After Visit Summary.  MyChart is used to connect with patients for Virtual Visits (Telemedicine).  Patients are able to view lab/test results, encounter notes, upcoming appointments, etc.  Non-urgent messages can be sent to your provider as well.   To learn more about what you can do with MyChart, go to [email or web address].   Other Instructions  [facility] A DEPT OF [facility] AT [address] A DEPT OF THE [place] [address] [place]  [address] Dept: [phone] Loc: [phone]  Annette Suarez  12/13/2023  You are scheduled for a Peripheral Angiogram on Wednesday, June 25 with Dr. Antionette Kirks.  1. Please arrive at the [facility] (Main Entrance A) at [facility]: [address] [place], [place] [address] at 6:30 AM (This time is 2 hour(s) before your procedure to ensure your preparation).   Free valet parking service is available. You will check in at ADMITTING. The support person will be asked to wait in the waiting room.  It is OK to have someone drop you off and come back when you are ready to be discharged.    Special note: Every effort is made to have your procedure done on time. Please understand that emergencies sometimes delay scheduled procedures.  2. Diet: Do not eat solid foods after midnight.  The patient may have clear liquids until 5am upon the day of the procedure.  3. Labs: You will need to have blood drawn on 12/13/23. You do not need to be fasting.  4. Medication instructions in preparation for your procedure: Hold all diabetic medication the morning of the procedure  -hold all diabetic medication  the morning of the procedure (Tresiba , Ozempic , Lispro) Hold the Chlorthalidone  the morning of the procedure   On the morning of your procedure, take your Aspirin  81 mg and any morning medicines NOT listed above.  You may use sips of water.  5. Plan to go home the same day, you will only stay overnight if medically necessary. 6.  Bring a current list of your medications and current insurance cards. 7. You MUST have a responsible person to drive you home. 8. Someone MUST be with you the first 24 hours after you arrive home or your discharge will be delayed. 9. Please wear clothes that are easy to get on and off and wear slip-on shoes.  Thank you for allowing us  to care for you!   -- [facility]

## 2023-12-14 ENCOUNTER — Telehealth: Payer: Self-pay | Admitting: *Deleted

## 2023-12-14 ENCOUNTER — Ambulatory Visit: Payer: Self-pay | Admitting: *Deleted

## 2023-12-14 LAB — BASIC METABOLIC PANEL WITH GFR
BUN/Creatinine Ratio: 23 (ref 12–28)
BUN: 28 mg/dL — ABNORMAL HIGH (ref 8–27)
CO2: 23 mmol/L (ref 20–29)
Calcium: 9 mg/dL (ref 8.7–10.3)
Chloride: 101 mmol/L (ref 96–106)
Creatinine, Ser: 1.23 mg/dL — ABNORMAL HIGH (ref 0.57–1.00)
Glucose: 130 mg/dL — ABNORMAL HIGH (ref 70–99)
Potassium: 4.3 mmol/L (ref 3.5–5.2)
Sodium: 139 mmol/L (ref 134–144)
eGFR: 44 mL/min/{1.73_m2} — ABNORMAL LOW (ref >59)

## 2023-12-14 LAB — CBC
Hematocrit: 35.3 % (ref 34.0–46.6)
Hemoglobin: 12 g/dL (ref 11.1–15.9)
MCH: 31.9 pg (ref 26.6–33.0)
MCHC: 34 g/dL (ref 31.5–35.7)
MCV: 94 fL (ref 79–97)
Platelets: 303 10*3/uL (ref 150–450)
RBC: 3.76 x10E6/uL — ABNORMAL LOW (ref 3.77–5.28)
RDW: 12 % (ref 11.7–15.4)
WBC: 9.1 10*3/uL (ref 3.4–10.8)

## 2023-12-14 NOTE — Telephone Encounter (Signed)
 Aortic Arch Angiography scheduled at [facility] for: Wednesday December 28, 2023 11:30 AM Arrival time [facility] Main Entrance A at: 6:30-pre-procedure hydration  Nothing to eat after midnight prior to procedure, clear liquids until 5 AM day of procedure.   Medication instructions: -Hold:  Chlorthalidone /irbesartan -day before and day of procedure per protocol eGFR <60 (44)  Insulin /Tresiba -AM of procedure  Ozempic -weekly on Mondays-pt will hold 6/23 -Other usual morning medications can be taken with sips of water including aspirin  81 mg.  Plan to go home the same day, you will only stay overnight if medically necessary.  You must have responsible adult to drive you home.  Someone must be with you the first 24 hours after you arrive home.  Reviewed procedure instructions/pre-procedure hydration with patient.

## 2023-12-27 ENCOUNTER — Other Ambulatory Visit: Payer: Self-pay | Admitting: Internal Medicine

## 2023-12-28 ENCOUNTER — Encounter (HOSPITAL_COMMUNITY)
Admission: RE | Disposition: A | Discharge status: Home / Self Care | Payer: Self-pay | Source: Ambulatory Visit | Attending: Cardiovascular Disease

## 2023-12-28 ENCOUNTER — Other Ambulatory Visit: Payer: Self-pay

## 2023-12-28 ENCOUNTER — Ambulatory Visit (HOSPITAL_COMMUNITY)
Admission: RE | Admit: 2023-12-28 | Discharge: 2023-12-28 | Disposition: A | Discharge status: Home / Self Care | Source: Ambulatory Visit | Attending: Cardiovascular Disease | Admitting: Cardiovascular Disease

## 2023-12-28 DIAGNOSIS — Z794 Long term (current) use of insulin: Secondary | ICD-10-CM | POA: Insufficient documentation

## 2023-12-28 DIAGNOSIS — E785 Hyperlipidemia, unspecified: Secondary | ICD-10-CM | POA: Diagnosis not present

## 2023-12-28 DIAGNOSIS — G4733 Obstructive sleep apnea (adult) (pediatric): Secondary | ICD-10-CM | POA: Diagnosis not present

## 2023-12-28 DIAGNOSIS — Z7985 Long-term (current) use of injectable non-insulin antidiabetic drugs: Secondary | ICD-10-CM | POA: Insufficient documentation

## 2023-12-28 DIAGNOSIS — I2510 Atherosclerotic heart disease of native coronary artery without angina pectoris: Secondary | ICD-10-CM | POA: Diagnosis not present

## 2023-12-28 DIAGNOSIS — E119 Type 2 diabetes mellitus without complications: Secondary | ICD-10-CM | POA: Insufficient documentation

## 2023-12-28 DIAGNOSIS — Z87891 Personal history of nicotine dependence: Secondary | ICD-10-CM | POA: Insufficient documentation

## 2023-12-28 DIAGNOSIS — I771 Stricture of artery: Secondary | ICD-10-CM

## 2023-12-28 DIAGNOSIS — I10 Essential (primary) hypertension: Secondary | ICD-10-CM | POA: Insufficient documentation

## 2023-12-28 DIAGNOSIS — Z79899 Other long term (current) drug therapy: Secondary | ICD-10-CM | POA: Diagnosis not present

## 2023-12-28 HISTORY — PX: AORTIC ARCH ANGIOGRAPHY: CATH118224

## 2023-12-28 LAB — GLUCOSE, CAPILLARY
Glucose-Capillary: 126 mg/dL — ABNORMAL HIGH (ref 70–99)
Glucose-Capillary: 165 mg/dL — ABNORMAL HIGH (ref 70–99)
Glucose-Capillary: 67 mg/dL — ABNORMAL LOW (ref 70–99)
Glucose-Capillary: 99 mg/dL (ref 70–99)

## 2023-12-28 SURGERY — AORTIC ARCH ANGIOGRAPHY
Anesthesia: LOCAL

## 2023-12-28 MED ORDER — SODIUM CHLORIDE 0.9 % IV SOLN: INTRAVENOUS | Status: DC

## 2023-12-28 MED ORDER — MIDAZOLAM HCL 2 MG/2ML IJ SOLN
INTRAMUSCULAR | Status: AC
  Filled 2023-12-28: qty 2

## 2023-12-28 MED ORDER — ACETAMINOPHEN 325 MG PO TABS
650.0000 mg | ORAL_TABLET | ORAL | Status: DC | PRN
Start: 2023-12-28 — End: 2023-12-28

## 2023-12-28 MED ORDER — HEPARIN SODIUM (PORCINE) 1000 UNIT/ML IJ SOLN
INTRAMUSCULAR | Status: AC
  Filled 2023-12-28: qty 10

## 2023-12-28 MED ORDER — LIDOCAINE HCL (PF) 1 % IJ SOLN
INTRAMUSCULAR | Status: AC
  Filled 2023-12-28: qty 30

## 2023-12-28 MED ORDER — DEXTROSE 50 % IV SOLN
INTRAVENOUS | Status: AC
  Filled 2023-12-28: qty 50

## 2023-12-28 MED ORDER — MIDAZOLAM HCL 2 MG/2ML IJ SOLN
INTRAMUSCULAR | Status: DC | PRN
Start: 2023-12-28 — End: 2023-12-28
  Administered 2023-12-28: 1 mg via INTRAVENOUS

## 2023-12-28 MED ORDER — SODIUM CHLORIDE 0.9 % WEIGHT BASED INFUSION: 1.0000 mL/kg/h | INTRAVENOUS | Status: DC

## 2023-12-28 MED ORDER — ASPIRIN 81 MG PO CHEW: 81.0000 mg | CHEWABLE_TABLET | ORAL | Status: DC

## 2023-12-28 MED ORDER — HEPARIN (PORCINE) IN NACL 1000-0.9 UT/500ML-% IV SOLN
INTRAVENOUS | Status: DC | PRN
  Administered 2023-12-28 (×2): 500 mL

## 2023-12-28 MED ORDER — SODIUM CHLORIDE 0.9% FLUSH: 3.0000 mL | INTRAVENOUS | Status: DC | PRN

## 2023-12-28 MED ORDER — FENTANYL CITRATE (PF) 100 MCG/2ML IJ SOLN
INTRAMUSCULAR | Status: AC
  Filled 2023-12-28: qty 2

## 2023-12-28 MED ORDER — SODIUM CHLORIDE 0.9 % WEIGHT BASED INFUSION
3.0000 mL/kg/h | INTRAVENOUS | Status: AC
  Administered 2023-12-28: 3 mL/kg/h via INTRAVENOUS

## 2023-12-28 MED ORDER — DEXTROSE 50 % IV SOLN
25.0000 mL | Freq: Once | INTRAVENOUS | Status: AC
  Administered 2023-12-28: 25 mL via INTRAVENOUS

## 2023-12-28 MED ORDER — ONDANSETRON HCL 4 MG/2ML IJ SOLN: 4.0000 mg | Freq: Four times a day (QID) | INTRAMUSCULAR | Status: DC | PRN

## 2023-12-28 MED ORDER — SODIUM CHLORIDE 0.9% FLUSH: 3.0000 mL | Freq: Two times a day (BID) | INTRAVENOUS | Status: DC

## 2023-12-28 MED ORDER — SODIUM CHLORIDE 0.9 % IV SOLN: 250.0000 mL | INTRAVENOUS | Status: DC | PRN

## 2023-12-28 MED ORDER — LIDOCAINE HCL (PF) 1 % IJ SOLN
INTRAMUSCULAR | Status: DC | PRN
  Administered 2023-12-28: 15 mL
  Administered 2023-12-28: 5 mL

## 2023-12-28 MED ORDER — FENTANYL CITRATE (PF) 100 MCG/2ML IJ SOLN
INTRAMUSCULAR | Status: DC | PRN
  Administered 2023-12-28: 25 ug via INTRAVENOUS

## 2023-12-28 MED ORDER — IODIXANOL 320 MG/ML IV SOLN
INTRAVENOUS | Status: DC | PRN
  Administered 2023-12-28: 45 mL

## 2023-12-28 SURGICAL SUPPLY — 15 items
BAG SNAP BAND KOVER 36X36 (MISCELLANEOUS) IMPLANT
CATH ANGIO 5F PIGTAIL 65CM (CATHETERS) IMPLANT
CATH INFINITI 5 FR JR3.5 (CATHETERS) IMPLANT
CATH INFINITI 5 FR STR PIGTAIL (CATHETERS) IMPLANT
COVER DOME SNAP 22 D (MISCELLANEOUS) IMPLANT
DEVICE CLOSURE MYNXGRIP 5F (Vascular Products) IMPLANT
KIT MICROPUNCTURE NIT STIFF (SHEATH) IMPLANT
KIT PV (KITS) ×1 IMPLANT
SET ATX-X65L (MISCELLANEOUS) IMPLANT
SHEATH PINNACLE 5F 10CM (SHEATH) IMPLANT
SHEATH PROBE COVER 6X72 (BAG) IMPLANT
SYR MEDRAD MARK 7 150ML (SYRINGE) ×1 IMPLANT
TRANSDUCER W/STOPCOCK (MISCELLANEOUS) ×1 IMPLANT
TRAY PV CATH (CUSTOM PROCEDURE TRAY) ×1 IMPLANT
WIRE HITORQ VERSACORE ST 145CM (WIRE) IMPLANT

## 2023-12-28 NOTE — Interval H&P Note (Signed)
 History and Physical Interval Note:  12/28/2023 10:37 AM  Annette Suarez  has presented today for surgery, with the diagnosis of Subclavian Stenosis.  The various methods of treatment have been discussed with the patient and family. After consideration of risks, benefits and other options for treatment, the patient has consented to  Procedure(s): AORTIC ARCH ANGIOGRAPHY (N/A) as a surgical intervention.  The patient's history has been reviewed, patient examined, no change in status, stable for surgery.  I have reviewed the patient's chart and labs.  Questions were answered to the patient's satisfaction.     [name]

## 2023-12-28 NOTE — Progress Notes (Signed)
 Patient had low blood sugar. Notified Delon and MD darron in room and aware. Dextrose  was given and will recheck sugar in 15 minutes.

## 2023-12-28 NOTE — Discharge Instructions (Signed)
 Femoral Site Care This sheet gives you information about how to care for yourself after your procedure. Your health care provider may also give you more specific instructions. If you have problems or questions, contact your health care provider. What can I expect after the procedure?  After the procedure, it is common to have: Bruising that usually fades within 1-2 weeks. Tenderness at the site. Follow these instructions at home: Wound care Follow instructions from your health care provider about how to take care of your insertion site. Make sure you: Wash your hands with soap and water before you change your bandage (dressing). If soap and water are not available, use hand sanitizer. Remove your dressing as told by your health care provider. In 24 hours Do not take baths, swim, or use a hot tub until your health care provider approves. You may shower 24-48 hours after the procedure or as told by your health care provider. Gently wash the site with plain soap and water. Pat the area dry with a clean towel. Do not rub the site. This may cause bleeding. Do not apply powder or lotion to the site. Keep the site clean and dry. Check your femoral site every day for signs of infection. Check for: Redness, swelling, or pain. Fluid or blood. Warmth. Pus or a bad smell. Activity For the first 2-3 days after your procedure, or as long as directed: Avoid climbing stairs as much as possible. Do not squat. Do not lift anything that is heavier than 10 lb (4.5 kg), or the limit that you are told, until your health care provider says that it is safe. For 5 days Rest as directed. Avoid sitting for a long time without moving. Get up to take short walks every 1-2 hours. Do not drive for 24 hours if you were given a medicine to help you relax (sedative). General instructions Take over-the-counter and prescription medicines only as told by your health care provider. Keep all follow-up visits as told by  your health care provider. This is important. Contact a health care provider if you have: A fever or chills. You have redness, swelling, or pain around your insertion site. Get help right away if: The catheter insertion area swells very fast. You pass out. You suddenly start to sweat or your skin gets clammy. The catheter insertion area is bleeding, and the bleeding does not stop when you hold steady pressure on the area. The area near or just beyond the catheter insertion site becomes pale, cool, tingly, or numb. These symptoms may represent a serious problem that is an emergency. Do not wait to see if the symptoms will go away. Get medical help right away. Call your local emergency services (911 in the [place] Do not drive yourself to the hospital. Summary After the procedure, it is common to have bruising that usually fades within 1-2 weeks. Check your femoral site every day for signs of infection. Do not lift anything that is heavier than 10 lb (4.5 kg), or the limit that you are told, until your health care provider says that it is safe. This information is not intended to replace advice given to you by your health care provider. Make sure you discuss any questions you have with your health care provider. Document Revised: 07/04/2017 Document Reviewed: 07/04/2017 Elsevier Patient Education  2020 [employer].

## 2023-12-29 ENCOUNTER — Other Ambulatory Visit: Payer: Self-pay | Admitting: *Deleted

## 2023-12-29 ENCOUNTER — Encounter (HOSPITAL_COMMUNITY): Payer: Self-pay | Admitting: Cardiovascular Disease

## 2023-12-29 DIAGNOSIS — I771 Stricture of artery: Secondary | ICD-10-CM

## 2023-12-30 MED FILL — Heparin Sodium (Porcine) Inj 1000 Unit/ML: INTRAMUSCULAR | Qty: 10 | Status: AC

## 2024-01-09 ENCOUNTER — Ambulatory Visit: Attending: Surgery | Admitting: Surgery

## 2024-01-09 ENCOUNTER — Encounter: Payer: Self-pay | Admitting: Surgery

## 2024-01-09 VITALS — BP 179/74 | HR 77 | Temp 98.0°F | Ht 61.0 in | Wt 156.0 lb

## 2024-01-09 DIAGNOSIS — I708 Atherosclerosis of other arteries: Secondary | ICD-10-CM | POA: Diagnosis not present

## 2024-01-09 NOTE — Progress Notes (Signed)
 [facility]  Patient name: Annette Suarez MRN: [id number] DOB: [birth year] Sex: female   REQUESTING PROVIDER:    Dr. Darron   REASON FOR CONSULT:    Left subclavian occlusion  HISTORY OF PRESENT ILLNESS:   Annette Suarez is a 83 y.o. female, who is referred for left subclavian artery occlusion. She was originally seen by Dr. Darron in 2022 for an asymptomatic subclavian stenosis.  No intervention was performed.  More recently she has developed symptoms including dizziness, especially when changing positions from laying down to standing up.  She will also get some numbness in her left fingers.  She also describes her gait at not being very steady and she will wobble back-and-forth.   The patient suffers from nonobstructive coronary artery disease.  She is medically managed for hypertension and diabetes.  She takes a statin for hypercholesterolemia.  She is a former smoker.  PAST MEDICAL HISTORY    Past Medical History:  Diagnosis Date   Acute cystitis    UTI   Anxiety    Arthritis    Benign positional vertigo    Dermatitis    Diabetes mellitus without complication (HCC)    Family history of adverse reaction to anesthesia    GERD (gastroesophageal reflux disease)    Goiter, nontoxic, multinodular    Gout    H/O: hysterectomy    History of tobacco use    Hx laparoscopic cholecystectomy    Hypercholesterolemia    Hypertension    Incomplete bladder emptying    Insomnia    Nausea & vomiting 09/08/2017   Postmenopausal atrophic vaginitis    RA (rheumatoid arthritis) (HCC)    Rectocele    Renal calculus    Skin cancer    Sleep apnea    Thyroid  condition    Type 2 diabetes mellitus (HCC)    Urolithiasis    Vaginal enterocele      FAMILY HISTORY   Family History  Problem Relation Age of Onset   Diabetes Mother    Heart disease Father 32   Diabetes Son    Crohn's disease Son     SOCIAL HISTORY:    Social History   Socioeconomic History   Marital status: Married    Spouse name: Not on file   Number of children: Not on file   Years of education: Not on file   Highest education level: Not on file  Occupational History   Not on file  Tobacco Use   Smoking status: Former    Current packs/day: 0.00    Average packs/day: 0.2 packs/day for 10.0 years (2.0 ttl pk-yrs)    Types: Cigarettes    Start date: 07/05/1968    Quit date: 07/05/1978    Years since quitting: 45.5    Passive exposure: Past   Smokeless tobacco: Never  Vaping Use   Vaping status: Never Used  Substance and Sexual Activity   Alcohol use: No   Drug use: No   Sexual activity: Not Currently  Other Topics Concern   Not on file  Social History Narrative   Diet: Blank      Do you drink/ eat things with caffeine? Yes      Marital status:  Married                             What year were you married ? 196      Do you live  in a house, apartment,assistred living, condo, trailer, etc.)? House      Is it one or more stories? 1      How many persons live in your home ? 2      Do you have any pets in your home ?(please list)  1 Sam-Dog      Highest Level of education completed: 12      Current or past profession: Office      Do you exercise?   Some                           Type & how often  Walk Daily      ADVANCED DIRECTIVES (Please bring copies)      Do you have a living will? Blank      Do you have a DNR form?  Blank                     If not, do you want to discuss one?       Do you have signed POA?HPOA forms?  Yes               If so, please bring to your appointment      FUNCTIONAL STATUS- To be completed by Spouse / child / Staff       Do you have difficulty bathing or dressing yourself ?  No      Do you have difficulty preparing food or eating ?  No      Do you have difficulty managing your mediation ?  No      Do you have difficulty managing your finances ?  No      Do you have  difficulty affording your medication ?  Yes (Some)      Social Drivers of Health   Financial Resource Strain: Low Risk  (05/19/2023)   Overall Financial Resource Strain (CARDIA)    Difficulty of Paying Living Expenses: Not hard at all  Food Insecurity: No Food Insecurity (05/19/2023)   Hunger Vital Sign    Worried About Running Out of Food in the Last Year: Never true    Ran Out of Food in the Last Year: Never true  Transportation Needs: No Transportation Needs (05/19/2023)   PRAPARE - Administrator, Civil Service (Medical): No    Lack of Transportation (Non-Medical): No  Physical Activity: Inactive (05/19/2023)   Exercise Vital Sign    Days of Exercise per Week: 0 days    Minutes of Exercise per Session: 0 min  Stress: No Stress Concern Present (05/19/2023)   [employer] of Occupational Health - Occupational Stress Questionnaire    Feeling of Stress : Not at all  Social Connections: Moderately Isolated (05/19/2023)   Social Connection and Isolation Panel    Frequency of Communication with Friends and Family: Three times a week    Frequency of Social Gatherings with Friends and Family: Twice a week    Attends Religious Services: Never    Database administrator or Organizations: Not on file    Attends Banker Meetings: Never    Marital Status: Married  Catering manager Violence: Not At Risk (05/19/2023)   Humiliation, Afraid, Rape, and Kick questionnaire    Fear of Current or Ex-Partner: No    Emotionally Abused: No    Physically Abused: No    Sexually Abused: No    ALLERGIES:  Allergies  Allergen Reactions   Latex Rash   Amaryl [Glimepiride]     Don't Remember   Amlodipine      LE edema on 7.5mg  daily. Tolerates 5mg  daily just fine   Codeine     SICK   Jardiance [Empagliflozin]     Urinary tract infection- SIDE EFFECT   Pneumovax 23 [Pneumococcal Vac Polyvalent]     Red / Swelling / Hot to touch SIDE EFFECTS   Wellbutrin  [Bupropion]     Unknown    CURRENT MEDICATIONS:    Current Outpatient Medications  Medication Sig Dispense Refill   Accu-Chek Softclix Lancets lancets Use to test blood sugar three times daily. Dx: E11.8 300 each 1   aspirin  EC 81 MG tablet Take 81 mg by mouth daily.     Biotin 1 MG CAPS Take 1 mg by mouth daily.     carvedilol  (COREG ) 12.5 MG tablet Take 1 tablet (12.5 mg total) by mouth 2 (two) times daily. 180 tablet 3   cetirizine  (ZYRTEC ) 10 MG tablet Take 1 tablet (10 mg total) by mouth daily. (Patient taking differently: Take 10 mg by mouth daily as needed for allergies.) 30 tablet 11   chlorthalidone  (HYGROTON ) 25 MG tablet TAKE 1 TABLET BY MOUTH DAILY 90 tablet 3   Cholecalciferol (VITAMIN D3) 50 MCG (2000 UT) capsule Take 2,000 Units by mouth daily.     clobetasol cream (TEMOVATE) 0.05 % Apply 1 Application topically daily as needed (Rash).     Continuous Blood Gluc Receiver (FREESTYLE LIBRE 2 READER) DEVI      Continuous Glucose Sensor (FREESTYLE LIBRE 2 SENSOR) MISC APPLY SENSOR TO SKIN FOR CONTINUOUS BLOOD SUGAR MONITORING, REPLACE EVERY 14 DAYS 2 each 11   Diclofenac Sodium (VOLTAREN EX) Apply 1 application  topically 3 (three) times daily as needed (Arthritis).     estradiol  (ESTRACE ) 0.1 MG/GM vaginal cream Place 1 Applicatorful vaginally 3 (three) times a week. (Patient taking differently: Place 1 Applicatorful vaginally 2 (two) times a week.) 42.5 g 0   etanercept  (ENBREL  SURECLICK) 50 MG/ML injection Inject 50 mg into the skin once a week. 12 mL 0   fluocinonide  (LIDEX ) 0.05 % external solution Apply 1 Application topically daily as needed. 60 mL 3   insulin  degludec (TRESIBA  FLEXTOUCH) 200 UNIT/ML FlexTouch Pen Inject 60 Units into the skin daily in the afternoon. 60 mL 6   insulin  lispro (HUMALOG  KWIKPEN) 100 UNIT/ML KwikPen Start CF: Humalog  (BG-130/35) TIDQAC and at bedtime  Max daily 30 units (Patient taking differently: Inject into the skin 3 (three) times daily  before meals. Sliding Scale: Less the 165 none 166-200=1 units 201-235=2 units 236-270=3 units 271-305=4 units 306-340=5 units 341-375=6 units 376-410=7 units 411-445=8 units Plus add 6 units  Max daily 30 units) 30 mL 3   Insulin  Pen Needle 31G X 5 MM MISC 1 Device by Does not apply route in the morning, at noon, in the evening, and at bedtime. 400 each 3   irbesartan  (AVAPRO ) 300 MG tablet TAKE 1 TABLET BY MOUTH DAILY 90 tablet 3   loperamide (IMODIUM A-D) 2 MG tablet Take 2 mg by mouth daily as needed for diarrhea or loose stools.     LORazepam  (ATIVAN ) 0.5 MG tablet Take 1 tablet (0.5 mg total) by mouth at bedtime. 30 tablet 5   MAGNESIUM CITRATE PO Take 250 mg by mouth daily.     nitroGLYCERIN  (NITROSTAT ) 0.4 MG SL tablet Place 1 tablet (0.4 mg total) under the tongue every 5 (  five) minutes as needed for chest pain. 25 tablet 3   Omeprazole 20 MG TBDD Take 20 mg by mouth daily.     ondansetron  (ZOFRAN ) 4 MG tablet TAKE 1 TABLET BY MOUTH 3 TIMES A DAY AS NEEDED FOR NAUSEA AND/OR VOMITING 20 tablet 0   Probiotic Product (PROBIOTIC PO) Take 1 tablet by mouth daily.     Propylene Glycol (SYSTANE COMPLETE OP) Place 1-2 drops into both eyes daily as needed (Dry eye).     Semaglutide ,0.25 or 0.5MG /DOS, (OZEMPIC , 0.25 OR 0.5 MG/DOSE,) 2 MG/1.5ML SOPN Inject 0.5 mg into the skin once a week. 3 mL 0   simvastatin  (ZOCOR ) 20 MG tablet TAKE ONE TABLET BY MOUTH EVERY EVENING 90 tablet 1   No current facility-administered medications for this visit.    REVIEW OF SYSTEMS:   [X]  denotes positive finding, [ ]  denotes negative finding Cardiac  Comments:  Chest pain or chest pressure:    Shortness of breath upon exertion:    Short of breath when lying flat:    Irregular heart rhythm:        Vascular    Pain in calf, thigh, or hip brought on by ambulation:    Pain in feet at night that wakes you up from your sleep:     Blood clot in your veins:    Leg swelling:         Pulmonary     Oxygen at home:    Productive cough:     Wheezing:         Neurologic    Sudden weakness in arms or legs:     Sudden numbness in arms or legs:     Sudden onset of difficulty speaking or slurred speech:    Temporary loss of vision in one eye:     Problems with dizziness:         Gastrointestinal    Blood in stool:      Vomited blood:         Genitourinary    Burning when urinating:     Blood in urine:        Psychiatric    Major depression:         Hematologic    Bleeding problems:    Problems with blood clotting too easily:        Skin    Rashes or ulcers:        Constitutional    Fever or chills:     PHYSICAL EXAM:   Vitals:   01/09/24 0814 01/09/24 0816  BP: 127/64 (!) 179/74  Pulse: 77   Temp: 98 F (36.7 C)   SpO2: 97%   Weight: 156 lb (70.8 kg)   Height: 5' 1 (1.549 m)     GENERAL: The patient is a well-nourished female, in no acute distress. The vital signs are documented above. CARDIAC: There is a regular rate and rhythm.  VASCULAR: Nonpalpable left brachial or radial pulse PULMONARY: Nonlabored respirations ABDOMEN: Soft and non-tender with normal pitched bowel sounds.  MUSCULOSKELETAL: There are no major deformities or cyanosis. NEUROLOGIC: No focal weakness or paresthesias are detected. SKIN: There are no ulcers or rashes noted. PSYCHIATRIC: The patient has a normal affect.  STUDIES:   I reviewed the following: Carotid duplex: Right Carotid: Velocities in the right ICA are consistent with a 1-39%  stenosis.   Left Carotid: Velocities in the left ICA are consistent with a 1-39%  stenosis.  Non-hemodynamically significant plaque <50% noted in the  CCA.   Vertebrals: Right vertebral artery demonstrates antegrade flow. Left  vertebral              artery demonstrates retrograde flow.  Subclavians: Left subclavian artery flow was disturbed with dampened flow.  Known              left subclavian steal syndrome. Normal flow  hemodynamics were  seen              in the right subclavian artery.   Carotid angiogram: This shows left subclavian artery occlusion with reconstitution distal from retrograde flow to the left vertebral artery  ASSESSMENT and PLAN   Left subclavian occlusion: I had a lengthy discussion with the patient, her son and her husband.  With regards to her left arm, I do not think that she has significant symptoms.  She will occasionally get some weakness but this does not appear to be very consistent.  She does complain of some dizziness when standing up which is likely not related to her left subclavian occlusion of some unsteadiness with her gait.  I doubt that this is related to her left subclavian occlusion, as her right vertebral is widely patent.  However just to be on the safe side, I will have her evaluated by neurology.  She will follow-up with me afterwards.  I also told her to monitor her left arm symptoms to determine whether or not these are lifestyle limiting.  We will make decisions regarding surgery when she follows up with me after her neurology visit   Malvina Serene CLORE, MD, FACS [facility] [phone] Pager [phone]

## 2024-01-11 ENCOUNTER — Telehealth: Payer: Self-pay | Admitting: Neurology

## 2024-01-11 NOTE — Telephone Encounter (Signed)
 Appointment details confirmed

## 2024-01-12 ENCOUNTER — Ambulatory Visit: Admitting: Neurology

## 2024-01-12 ENCOUNTER — Encounter: Payer: Self-pay | Admitting: Neurology

## 2024-01-12 ENCOUNTER — Telehealth: Payer: Self-pay | Admitting: Neurology

## 2024-01-12 ENCOUNTER — Other Ambulatory Visit: Payer: Self-pay

## 2024-01-12 VITALS — BP 143/66 | HR 78 | Ht 61.0 in | Wt 156.0 lb

## 2024-01-12 DIAGNOSIS — G458 Other transient cerebral ischemic attacks and related syndromes: Secondary | ICD-10-CM | POA: Diagnosis not present

## 2024-01-12 DIAGNOSIS — M0609 Rheumatoid arthritis without rheumatoid factor, multiple sites: Secondary | ICD-10-CM

## 2024-01-12 DIAGNOSIS — R799 Abnormal finding of blood chemistry, unspecified: Secondary | ICD-10-CM | POA: Diagnosis not present

## 2024-01-12 DIAGNOSIS — Z0001 Encounter for general adult medical examination with abnormal findings: Secondary | ICD-10-CM | POA: Diagnosis not present

## 2024-01-12 DIAGNOSIS — Z79899 Other long term (current) drug therapy: Secondary | ICD-10-CM | POA: Diagnosis not present

## 2024-01-12 DIAGNOSIS — R42 Dizziness and giddiness: Secondary | ICD-10-CM

## 2024-01-12 MED ORDER — ENBREL SURECLICK 50 MG/ML ~~LOC~~ SOAJ: 50.0000 mg | SUBCUTANEOUS | 0 refills | Status: DC

## 2024-01-12 MED ORDER — ALPRAZOLAM 0.5 MG PO TABS: 0.5000 mg | ORAL_TABLET | ORAL | 0 refills | Status: DC

## 2024-01-12 NOTE — Telephone Encounter (Signed)
 no auth required sent to [facility] for an open MRI per patient. [phone]

## 2024-01-12 NOTE — Telephone Encounter (Signed)
 Last Fill: 10/19/2023  Labs: 10/31/2023 Glucose was very elevated at 252 yesterday.  Creatinine was elevated at 1.14 and GFR was low at 49--stable.  Rest of CMP within normal limits.  CBC within normal limits.  ESR and CRP within normal limits.    TB Gold: 08/18/2023 TB Gold Negative   Next Visit: 04/03/2024  Last Visit: 10/31/2023  DX: Rheumatoid arthritis of multiple sites with negative rheumatoid factor   Current Dose per office note 10/31/2023: Enbrel  50 mg sq injections once weekly   Okay to refill Enbrel ?

## 2024-01-12 NOTE — Patient Instructions (Signed)
 I had a long d/w patient and her husband about her recurrent episodes of transient dizziness with neck extension likely related to subclavian steal syndrome, risk for recurrent stroke/TIAs, personally independently reviewed imaging studies and stroke evaluation results and answered questions.I recommend further evaluation by checking MRI scan of the brain and MRA of the brain and neck, lipid profile and hemoglobin A1c..  She may need left subclavian bypass surgery to mitigate her symptoms.  Continue aspirin  81 mg daily  for secondary stroke prevention and maintain strict control of hypertension with blood pressure goal below 130/90, diabetes with hemoglobin A1c goal below 6.5% and lipids with LDL cholesterol goal below 70 mg/dL. I also advised the patient to eat a healthy diet with plenty of whole grains, cereals, fruits and vegetables, exercise regularly and maintain ideal body weight Followup in the future with me in 6 months or call earlier if necessary.  Stroke Prevention Some medical conditions and behaviors can lead to a higher chance of having a stroke. You can help prevent a stroke by eating healthy, exercising, not smoking, and managing any medical conditions you have. Stroke is a leading cause of functional impairment. Primary prevention is particularly important because a majority of strokes are first-time events. Stroke changes the lives of not only those who experience a stroke but also their family and other caregivers. How can this condition affect me? A stroke is a medical emergency and should be treated right away. A stroke can lead to brain damage and can sometimes be life-threatening. If a person gets medical treatment right away, there is a better chance of surviving and recovering from a stroke. What can increase my risk? The following medical conditions may increase your risk of a stroke: Cardiovascular disease. High blood pressure (hypertension). Diabetes. High cholesterol. Sickle  cell disease. Blood clotting disorders (hypercoagulable state). Obesity. Sleep disorders (obstructive sleep apnea). Other risk factors include: Being older than age 77. Having a history of blood clots, stroke, or mini-stroke (transient ischemic attack, TIA). Genetic factors, such as race, ethnicity, or a family history of stroke. Smoking cigarettes or using other tobacco products. Taking birth control pills, especially if you also use tobacco. Heavy use of alcohol or drugs, especially cocaine and methamphetamine. Physical inactivity. What actions can I take to prevent this? Manage your health conditions High cholesterol levels. Eating a healthy diet is important for preventing high cholesterol. If cholesterol cannot be managed through diet alone, you may need to take medicines. Take any prescribed medicines to control your cholesterol as told by your health care provider. Hypertension. To reduce your risk of stroke, try to keep your blood pressure below 130/80. Eating a healthy diet and exercising regularly are important for controlling blood pressure. If these steps are not enough to manage your blood pressure, you may need to take medicines. Take any prescribed medicines to control hypertension as told by your health care provider. Ask your health care provider if you should monitor your blood pressure at home. Have your blood pressure checked every year, even if your blood pressure is normal. Blood pressure increases with age and some medical conditions. Diabetes. Eating a healthy diet and exercising regularly are important parts of managing your blood sugar (glucose). If your blood sugar cannot be managed through diet and exercise, you may need to take medicines. Take any prescribed medicines to control your diabetes as told by your health care provider. Get evaluated for obstructive sleep apnea. Talk to your health care provider about getting a sleep evaluation  if you snore a lot or  have excessive sleepiness. Make sure that any other medical conditions you have, such as atrial fibrillation or atherosclerosis, are managed. Nutrition Follow instructions from your health care provider about what to eat or drink to help manage your health condition. These instructions may include: Reducing your daily calorie intake. Limiting how much salt (sodium) you use to 1,500 milligrams (mg) each day. Using only healthy fats for cooking, such as olive oil, canola oil, or sunflower oil. Eating healthy foods. You can do this by: Choosing foods that are high in fiber, such as whole grains, and fresh fruits and vegetables. Eating at least 5 servings of fruits and vegetables a day. Try to fill one-half of your plate with fruits and vegetables at each meal. Choosing lean protein foods, such as lean cuts of meat, poultry without skin, fish, tofu, beans, and nuts. Eating low-fat dairy products. Avoiding foods that are high in sodium. This can help lower blood pressure. Avoiding foods that have saturated fat, trans fat, and cholesterol. This can help prevent high cholesterol. Avoiding processed and prepared foods. Counting your daily carbohydrate intake.  Lifestyle If you drink alcohol: Limit how much you have to: 0-1 drink a day for women who are not pregnant. 0-2 drinks a day for men. Know how much alcohol is in your drink. In the [place] one drink equals one 12 oz bottle of beer ( ), one 5 oz glass of wine ( ), or one 1 oz glass of hard liquor (44mL). Do not use any products that contain nicotine or tobacco. These products include cigarettes, chewing tobacco, and vaping devices, such as e-cigarettes. If you need help quitting, ask your health care provider. Avoid secondhand smoke. Do not use drugs. Activity  Try to stay at a healthy weight. Get at least 30 minutes of exercise on most days, such as: Fast walking. Biking. Swimming. Medicines Take over-the-counter and  prescription medicines only as told by your health care provider. Aspirin  or blood thinners (antiplatelets or anticoagulants) may be recommended to reduce your risk of forming blood clots that can lead to stroke. Avoid taking birth control pills. Talk to your health care provider about the risks of taking birth control pills if: You are over 53 years old. You smoke. You get very bad headaches. You have had a blood clot. Where to find more information American Stroke Association: www.strokeassociation.org Get help right away if: You or a loved one has any symptoms of a stroke. BE FAST is an easy way to remember the main warning signs of a stroke: B - Balance. Signs are dizziness, sudden trouble walking, or loss of balance. E - Eyes. Signs are trouble seeing or a sudden change in vision. F - Face. Signs are sudden weakness or numbness of the face, or the face or eyelid drooping on one side. A - Arms. Signs are weakness or numbness in an arm. This happens suddenly and usually on one side of the body. S - Speech. Signs are sudden trouble speaking, slurred speech, or trouble understanding what people say. T - Time. Time to call emergency services. Write down what time symptoms started. You or a loved one has other signs of a stroke, such as: A sudden, severe headache with no known cause. Nausea or vomiting. Seizure. These symptoms may represent a serious problem that is an emergency. Do not wait to see if the symptoms will go away. Get medical help right away. Call your local emergency services (911 in the [place]  Do not drive yourself to the hospital. Summary You can help to prevent a stroke by eating healthy, exercising, not smoking, limiting alcohol intake, and managing any medical conditions you may have. Do not use any products that contain nicotine or tobacco. These include cigarettes, chewing tobacco, and vaping devices, such as e-cigarettes. If you need help quitting, ask your health care  provider. Remember BE FAST for warning signs of a stroke. Get help right away if you or a loved one has any of these signs. This information is not intended to replace advice given to you by your health care provider. Make sure you discuss any questions you have with your health care provider. Document Revised: 05/24/2022 Document Reviewed: 05/24/2022 Elsevier Patient Education  2024 [employer].

## 2024-01-12 NOTE — Progress Notes (Signed)
 [facility] [address] [place]. [place] [address] [phone]       OFFICE CONSULT NOTE  Annette Suarez Date of Birth:  02-22-1941 Medical Record Number:  [id number]   Referring MD: Gaile Elder  Reason for Referral: Dizziness and subclavian steal HPI : Annette Suarez is a pleasant 83 year old Caucasian lady seen today for initial office consultation visit.  She is accompanied by her husband.  History is obtained from them and review of electronic medical records.  I personally reviewed pertinent available imaging films in PACS.  She has past medical history of nonobstructive coronary artery disease, diabetes, hypertension, hyperlipidemia, obstructive sleep apnea, gastroesophageal reflux disease, rheumatoid arthritis.  Annette Suarez states that for the last few months she has had intermittent dizzy spells.  She notices this mostly when she bends down.  She feels dizzy and lightheaded swimmy headed this feeling is transient and resolves when her head position is reversed.  She also has trouble lying flat and moving her neck all the way to the back.  She has had remote history of intermittent vertigo off and on for years.  She has also noted some numbness in the left hand fingers and mentioned it to Dr. Serene but did not mention it to me today.  Of late she is also noted some gait ataxia and leaning to the right side while walking.  However she has had no falls or injuries.  She has resorted to using a cane for walking of late.  Occasionally she has to hold onto 1 person while walking.  She denies any known history of strokes, TIAs seizures or neurological problems.  She does have a known history of left subclavian occlusion and recent aortic arch angiogram study by vascular surgery on 12/28/2023 showed left subclavian artery occlusion proximal to the origin of the left vertebral with distal flow in the distal yesterday through reversed flow in the left vertebral artery.  Carotid ultrasound  in 10/26/2023 showed bilateral 1-39% carotid stenosis with flow waveform in the left vertebral artery.  She has not had any brain imaging studies or recent lab work for lipid profile and hemoglobin A1c. ROS:   14 system review of systems is positive for dizziness, vertigo, hemihydrate, lightheadedness, off-balance all other systems negative  PMH:  Past Medical History:  Diagnosis Date   Acute cystitis    UTI   Anxiety    Arthritis    Benign positional vertigo    Dermatitis    Diabetes mellitus without complication (HCC)    Family history of adverse reaction to anesthesia    GERD (gastroesophageal reflux disease)    Goiter, nontoxic, multinodular    Gout    H/O: hysterectomy    History of tobacco use    Hx laparoscopic cholecystectomy    Hypercholesterolemia    Hypertension    Incomplete bladder emptying    Insomnia    Nausea & vomiting 09/08/2017   Postmenopausal atrophic vaginitis    RA (rheumatoid arthritis) (HCC)    Rectocele    Renal calculus    Skin cancer    Sleep apnea    Thyroid  condition    Type 2 diabetes mellitus (HCC)    Urolithiasis    Vaginal enterocele     Social History:  Social History   Socioeconomic History   Marital status: Married    Spouse name: Not on file   Number of children: Not on file   Years of education: Not on file   Highest education  level: Not on file  Occupational History   Not on file  Tobacco Use   Smoking status: Former    Current packs/day: 0.00    Average packs/day: 0.2 packs/day for 10.0 years (2.0 ttl pk-yrs)    Types: Cigarettes    Start date: 07/05/1968    Quit date: 07/05/1978    Years since quitting: 45.5    Passive exposure: Past   Smokeless tobacco: Never  Vaping Use   Vaping status: Never Used  Substance and Sexual Activity   Alcohol use: No   Drug use: No   Sexual activity: Not Currently  Other Topics Concern   Not on file  Social History Narrative   Diet: Blank      Do you drink/ eat things with  caffeine? Yes      Marital status:  Married                             What year were you married ? 196      Do you live in a house, apartment,assistred living, condo, trailer, etc.)? House      Is it one or more stories? 1      How many persons live in your home ? 2      Do you have any pets in your home ?(please list)  1 Sam-Dog      Highest Level of education completed: 12      Current or past profession: Office      Do you exercise?   Some                           Type & how often  Walk Daily      ADVANCED DIRECTIVES (Please bring copies)      Do you have a living will? Blank      Do you have a DNR form?  Blank                     If not, do you want to discuss one?       Do you have signed POA?HPOA forms?  Yes               If so, please bring to your appointment      FUNCTIONAL STATUS- To be completed by Spouse / child / Staff       Do you have difficulty bathing or dressing yourself ?  No      Do you have difficulty preparing food or eating ?  No      Do you have difficulty managing your mediation ?  No      Do you have difficulty managing your finances ?  No      Do you have difficulty affording your medication ?  Yes (Some)      Social Drivers of Health   Financial Resource Strain: Low Risk  (05/19/2023)   Overall Financial Resource Strain (CARDIA)    Difficulty of Paying Living Expenses: Not hard at all  Food Insecurity: No Food Insecurity (05/19/2023)   Hunger Vital Sign    Worried About Running Out of Food in the Last Year: Never true    Ran Out of Food in the Last Year: Never true  Transportation Needs: No Transportation Needs (05/19/2023)   PRAPARE - Transportation    Lack of Transportation (Medical): No    Lack of  Transportation (Non-Medical): No  Physical Activity: Inactive (05/19/2023)   Exercise Vital Sign    Days of Exercise per Week: 0 days    Minutes of Exercise per Session: 0 min  Stress: No Stress Concern Present (05/19/2023)   [employer] - Occupational Stress Questionnaire    Feeling of Stress : Not at all  Social Connections: Moderately Isolated (05/19/2023)   Social Connection and Isolation Panel    Frequency of Communication with Friends and Family: Three times a week    Frequency of Social Gatherings with Friends and Family: Twice a week    Attends Religious Services: Never    Database administrator or Organizations: Not on file    Attends Banker Meetings: Never    Marital Status: Married  Catering manager Violence: Not At Risk (05/19/2023)   Humiliation, Afraid, Rape, and Kick questionnaire    Fear of Current or Ex-Partner: No    Emotionally Abused: No    Physically Abused: No    Sexually Abused: No    Medications:   Current Outpatient Medications on File Prior to Visit  Medication Sig Dispense Refill   Accu-Chek Softclix Lancets lancets Use to test blood sugar three times daily. Dx: E11.8 300 each 1   aspirin  EC 81 MG tablet Take 81 mg by mouth daily.     Biotin 1 MG CAPS Take 1 mg by mouth daily.     carvedilol  (COREG ) 12.5 MG tablet Take 1 tablet (12.5 mg total) by mouth 2 (two) times daily. 180 tablet 3   cetirizine  (ZYRTEC ) 10 MG tablet Take 1 tablet (10 mg total) by mouth daily. (Annette Suarez taking differently: Take 10 mg by mouth daily as needed for allergies.) 30 tablet 11   chlorthalidone  (HYGROTON ) 25 MG tablet TAKE 1 TABLET BY MOUTH DAILY 90 tablet 3   Cholecalciferol (VITAMIN D3) 50 MCG (2000 UT) capsule Take 2,000 Units by mouth daily.     clobetasol cream (TEMOVATE) 0.05 % Apply 1 Application topically daily as needed (Rash).     Continuous Blood Gluc Receiver (FREESTYLE LIBRE 2 READER) DEVI      Continuous Glucose Sensor (FREESTYLE LIBRE 2 SENSOR) MISC APPLY SENSOR TO SKIN FOR CONTINUOUS BLOOD SUGAR MONITORING, REPLACE EVERY 14 DAYS 2 each 11   Diclofenac Sodium (VOLTAREN EX) Apply 1 application  topically 3 (three) times daily as needed (Arthritis).      estradiol  (ESTRACE ) 0.1 MG/GM vaginal cream Place 1 Applicatorful vaginally 3 (three) times a week. (Annette Suarez taking differently: Place 1 Applicatorful vaginally 2 (two) times a week.) 42.5 g 0   fluocinonide  (LIDEX ) 0.05 % external solution Apply 1 Application topically daily as needed. 60 mL 3   insulin  degludec (TRESIBA  FLEXTOUCH) 200 UNIT/ML FlexTouch Pen Inject 60 Units into the skin daily in the afternoon. 60 mL 6   insulin  lispro (HUMALOG  KWIKPEN) 100 UNIT/ML KwikPen Start CF: Humalog  (BG-130/35) TIDQAC and at bedtime  Max daily 30 units (Annette Suarez taking differently: Inject into the skin 3 (three) times daily before meals. Sliding Scale: Less the 165 none 166-200=1 units 201-235=2 units 236-270=3 units 271-305=4 units 306-340=5 units 341-375=6 units 376-410=7 units 411-445=8 units Plus add 6 units  Max daily 30 units) 30 mL 3   Insulin  Pen Needle 31G X 5 MM MISC 1 Device by Does not apply route in the morning, at noon, in the evening, and at bedtime. 400 each 3   irbesartan  (AVAPRO ) 300 MG tablet TAKE 1 TABLET BY MOUTH DAILY 90  tablet 3   loperamide (IMODIUM A-D) 2 MG tablet Take 2 mg by mouth daily as needed for diarrhea or loose stools.     LORazepam  (ATIVAN ) 0.5 MG tablet Take 1 tablet (0.5 mg total) by mouth at bedtime. 30 tablet 5   MAGNESIUM CITRATE PO Take 250 mg by mouth daily.     nitroGLYCERIN  (NITROSTAT ) 0.4 MG SL tablet Place 1 tablet (0.4 mg total) under the tongue every 5 (five) minutes as needed for chest pain. 25 tablet 3   Omeprazole 20 MG TBDD Take 20 mg by mouth daily.     ondansetron  (ZOFRAN ) 4 MG tablet TAKE 1 TABLET BY MOUTH 3 TIMES A DAY AS NEEDED FOR NAUSEA AND/OR VOMITING 20 tablet 0   Probiotic Product (PROBIOTIC PO) Take 1 tablet by mouth daily.     Propylene Glycol (SYSTANE COMPLETE OP) Place 1-2 drops into both eyes daily as needed (Dry eye).     Semaglutide ,0.25 or 0.5MG /DOS, (OZEMPIC , 0.25 OR 0.5 MG/DOSE,) 2 MG/1.5ML SOPN Inject 0.5 mg into the skin once  a week. 3 mL 0   simvastatin  (ZOCOR ) 20 MG tablet TAKE ONE TABLET BY MOUTH EVERY EVENING 90 tablet 1   No current facility-administered medications on file prior to visit.    Allergies:   Allergies  Allergen Reactions   Latex Rash   Amaryl [Glimepiride]     Don't Remember   Amlodipine      LE edema on 7.5mg  daily. Tolerates 5mg  daily just fine   Codeine     SICK   Jardiance [Empagliflozin]     Urinary tract infection- SIDE EFFECT   Pneumovax 23 [Pneumococcal Vac Polyvalent]     Red / Swelling / Hot to touch SIDE EFFECTS   Wellbutrin [Bupropion]     Unknown    Physical Exam General: well developed, well nourished pleasant elderly Caucasian lady, seated, in no evident distress Head: head normocephalic and atraumatic.   Neck: supple with no carotid or supraclavicular bruits Cardiovascular: regular rate and rhythm, no murmurs Musculoskeletal: no deformity Skin:  no rash/petichiae Vascular: Diminished left radial and brachial pulses.  Rest of the pulses are normal Neurologic Exam Mental Status: Awake and fully alert. Oriented to place and time. Recent and remote memory intact. Attention span, concentration and fund of knowledge appropriate. Mood and affect appropriate.  Cranial Nerves: Fundoscopic exam reveals sharp disc margins. Pupils equal, briskly reactive to light. Extraocular movements full without nystagmus. Visual fields full to confrontation. Hearing intact. Facial sensation intact. Face, tongue, palate moves normally and symmetrically.  Motor: Normal bulk and tone. Normal strength in all tested extremity muscles. Sensory.: intact to touch , pinprick , position and vibratory sensation.  Coordination: Rapid alternating movements normal in all extremities. Finger-to-nose and heel-to-shin performed accurately bilaterally.  Positive Fukuda stepping test with Annette Suarez rotating to the right and moving several steps of the base.  Headshaking produces subjective dizziness but no  objective vertigo. Gait and Station: Arises from chair without difficulty. Stance is normal. Gait demonstrates normal stride length and balance . Able to heel, toe and tandem walk with great difficulty.  Reflexes: 1+ and symmetric. Toes downgoing.   NIHSS  0 Modified Rankin  1   ASSESSMENT: 83 year old Caucasian lady with transient positional dizziness with neck movements likely related to subclavian steal with known chronic left subclavian occlusion.   Multiple vascular risk factors of coronary artery disease, diabetes, hypertension, hyperlipidemia and mild obesity.    PLAN:I had a long d/w Annette Suarez and her husband about her  recurrent episodes of transient dizziness with neck extension likely related to subclavian steal syndrome, risk for recurrent stroke/TIAs, personally independently reviewed imaging studies and stroke evaluation results and answered questions.I recommend further evaluation by checking MRI scan of the brain and MRA of the brain and neck, lipid profile and hemoglobin A1c..  She may need left subclavian bypass surgery to mitigate her symptoms.  Continue aspirin  81 mg daily  for secondary stroke prevention and maintain strict control of hypertension with blood pressure goal below 130/90, diabetes with hemoglobin A1c goal below 6.5% and lipids with LDL cholesterol goal below 70 mg/dL. I also advised the Annette Suarez to eat a healthy diet with plenty of whole grains, cereals, fruits and vegetables, exercise regularly and maintain ideal body weight Followup in the future with me in 6 months or call earlier if necessary. Greater than 50% time during this 45-minute consultation visit was spent in counseling and coordination of care about her dizziness and subclavian occlusion and vertebrobasilar steal discussion and answering questions Eather Popp, MD Note: This document was prepared with digital dictation and possible smart phrase technology. Any transcriptional errors that result from this  process are unintentional.

## 2024-01-13 LAB — LIPID PANEL
Chol/HDL Ratio: 3 ratio (ref 0.0–4.4)
Cholesterol, Total: 111 mg/dL (ref 100–199)
HDL: 37 mg/dL — ABNORMAL LOW (ref >39)
LDL Chol Calc (NIH): 48 mg/dL (ref 0–99)
Triglycerides: 153 mg/dL — ABNORMAL HIGH (ref 0–149)
VLDL Cholesterol Cal: 26 mg/dL (ref 5–40)

## 2024-01-13 LAB — HEMOGLOBIN A1C
Est. average glucose Bld gHb Est-mCnc: 192 mg/dL
Hgb A1c MFr Bld: 8.3 % — ABNORMAL HIGH (ref 4.8–5.6)

## 2024-01-16 DIAGNOSIS — N3021 Other chronic cystitis with hematuria: Secondary | ICD-10-CM | POA: Diagnosis not present

## 2024-01-16 DIAGNOSIS — R3914 Feeling of incomplete bladder emptying: Secondary | ICD-10-CM | POA: Diagnosis not present

## 2024-01-17 ENCOUNTER — Ambulatory Visit: Attending: Physician Assistant | Admitting: Physician Assistant

## 2024-01-17 ENCOUNTER — Ambulatory Visit: Payer: Self-pay | Admitting: Neurology

## 2024-01-17 ENCOUNTER — Encounter: Payer: Self-pay | Admitting: Physician Assistant

## 2024-01-17 ENCOUNTER — Other Ambulatory Visit: Payer: Self-pay | Admitting: Orthopedic Surgery

## 2024-01-17 VITALS — BP 140/70 | HR 81 | Ht 61.0 in | Wt 157.0 lb

## 2024-01-17 DIAGNOSIS — E785 Hyperlipidemia, unspecified: Secondary | ICD-10-CM

## 2024-01-17 DIAGNOSIS — I771 Stricture of artery: Secondary | ICD-10-CM

## 2024-01-17 DIAGNOSIS — I2510 Atherosclerotic heart disease of native coronary artery without angina pectoris: Secondary | ICD-10-CM | POA: Diagnosis not present

## 2024-01-17 DIAGNOSIS — I10 Essential (primary) hypertension: Secondary | ICD-10-CM

## 2024-01-17 DIAGNOSIS — G4709 Other insomnia: Secondary | ICD-10-CM

## 2024-01-17 DIAGNOSIS — F419 Anxiety disorder, unspecified: Secondary | ICD-10-CM

## 2024-01-17 NOTE — Progress Notes (Signed)
 Kindly inform the patient that cholesterol profile is satisfactory however hemoglobin A1c test for diabetes is not satisfactory and to see primary care physician for help in managing diabetes more tightly

## 2024-01-17 NOTE — Progress Notes (Signed)
 Cardiology Office Note   Date:  01/17/2024  ID:  Annette Suarez, DOB Feb 06, 1941, MRN [id number] PCP: Annette Greig BRAVO, NP  [facility] Providers Cardiologist:  Annette DELENA Leavens, MD     History of Present Illness Annette Suarez is Annette 83 y.o. female with past medical history of nonobstructive CAD, hypertension, hyperlipidemia and subclavian artery stenosis.  Echocardiogram in January 2022 showed EF greater than 75%, no regional wall motion abnormality, mild MR, trivial AI.  Coronary CTA in March 2023 showed nonobstructive CAD, medical therapy was recommended.  She underwent cardiac catheterization on 10/22/2022 that showed 50% mid left circumflex lesion, 50% distal left circumflex lesion, LVEDP 21 mmHg, EF greater than 65%.  She has experienced dizziness, elevated blood pressure and bendopnea.  Patient was recently seen by Dr. Arnetha in May and referred to Dr. Darron at due to symptomatic subclavian artery disease.  She underwent left subclavian artery aortic arch angiography by Dr. Darron on 12/28/2023, this revealed flush heavily calcified occlusion on the left subclavian artery just before the origin of the left vertebral artery.  The left subclavian artery fills via retrograde flow from the left vertebral artery.  No significant obstructive disease affecting the innominate artery and the left carotid artery.  Due to long heavily calcified flush occlusion of the left subclavian artery, this is not suitable for endovascular intervention.  Recommend either medical therapy versus carotid to subclavian/axillary artery bypass. Since discharge, patient was seen by Dr. Serene on 01/09/2024 who felt her symptomatic dizziness is less likely to be coming from the subclavian artery occlusion.  She was referred to neurology service.  She also did not have any symptom of left arm weakness.  Dr. Serene will make decision regarding surgery when she follow-up after neurology visit.  She was seen by Dr. Rosemarie on  01/12/2024.  Outpatient MRI of the brain and neck as ordered.  Patient presents today accompanied by her husband.  She continued to have intermittent dizzy spells especially when she bent her neck.  She denies significant weakness in the left arm, however left arm blood pressure is about 40 mmHg lower than the right side.  She is aware to always check her blood pressure in the right arm only.  She denies any chest pain or shortness of breath.  She has no lower extremity edema, orthopnea or PND.  She can follow-up with Dr. Darron in 6 months.  Will defer to Dr. Serene to decide on whether she will require surgery during meantime.  ROS:   She continues to have intermittent dizziness when she bent her neck.  Patient denies any chest pain, shortness of breath, lower extremity edema, orthopnea or PND.  Studies Reviewed      Cardiac Studies & Procedures   ______________________________________________________________________________________________ CARDIAC CATHETERIZATION  CARDIAC CATHETERIZATION 10/22/2022  Conclusion   Mid Cx lesion is 50% stenosed.  Moderate calcification.   Dist Cx lesion is 50% stenosed.   The left ventricular systolic function is normal.   LV end diastolic pressure is mildly elevated.  LVEDP 21 mmHg.   The left ventricular ejection fraction is greater than 65% by visual estimate.   There is no aortic valve stenosis.   On 2L Annette Suarez Aortic saturation 100%, PA saturation 79%, RA pressure 17/13, mean RA pressure 12 mmHg, PA pressure 39/19, mean PA pressure 25 mmHg, pulmonary capillary wedge pressure 22/22, mean pulmonary capillary wedge pressure 22 mmHg, cardiac output 6 L/min, cardiac index 3.5.   Heavily calcified proximal left subclavian  with delayed filling.  Nonobstructive coronary artery disease.  Upper normal pulmonary artery pressures.  Heavily calcified proximal left subclavian.  Findings Coronary Findings Diagnostic  Dominance: Right  Left Anterior Descending The  vessel exhibits minimal luminal irregularities.  Left Circumflex Mid Cx lesion is 50% stenosed. The lesion is eccentric. Dist Cx lesion is 50% stenosed.  Intervention  No interventions have been documented.     ECHOCARDIOGRAM  ECHOCARDIOGRAM COMPLETE 07/08/2020  Narrative ECHOCARDIOGRAM REPORT    Patient Name:   Annette Suarez  Date of Exam: 07/08/2020 Medical Rec #:  [id number]     Height:       61.0 in Accession #:    [id number]    Weight:       158.0 lb Date of Birth:  Mar 29, 1941     BSA:          1.709 m Patient Age:    89 years      BP:           140/92 mmHg Patient Gender: F             HR:           90 bpm. Exam Location:  [facility]  Procedure: 2D Echo, 3D Echo, Cardiac Doppler, Color Doppler and Strain Analysis  Indications:    I10 Hypertension  History:        Patient has no prior history of Echocardiogram examinations. Risk Factors:Diabetes, Sleep Apnea, Former Smoker and Dyslipidemia.  Sonographer:    Annette Suarez, RDCS Referring Phys: [id number] Annette Suarez Annette Suarez  IMPRESSIONS   1. Left ventricular ejection fraction, by estimation, is >75%. The left ventricle has hyperdynamic function. The left ventricle has no regional wall motion abnormalities. Left ventricular diastolic parameters are consistent with Grade I diastolic dysfunction (impaired relaxation). Elevated left atrial pressure. 2. Right ventricular systolic function is normal. The right ventricular size is normal. There is mildly elevated pulmonary artery systolic pressure. 3. The mitral valve is normal in structure. Mild mitral valve regurgitation. No evidence of mitral stenosis. 4. The aortic valve is tricuspid. Aortic valve regurgitation is trivial. Mild aortic valve sclerosis is present, with no evidence of aortic valve stenosis. 5. The inferior vena cava is normal in size with greater than 50% respiratory variability, suggesting right atrial pressure of 3 mmHg.  FINDINGS Left Ventricle: Left  ventricular ejection fraction, by estimation, is >75%. The left ventricle has hyperdynamic function. The left ventricle has no regional wall motion abnormalities. The left ventricular internal cavity size was normal in size. There is no left ventricular hypertrophy. Left ventricular diastolic parameters are consistent with Grade I diastolic dysfunction (impaired relaxation). Elevated left atrial pressure.  Right Ventricle: The right ventricular size is normal. Right ventricular systolic function is normal. There is mildly elevated pulmonary artery systolic pressure. The tricuspid regurgitant velocity is 2.97 m/s, and with an assumed right atrial pressure of 3 mmHg, the estimated right ventricular systolic pressure is 38.3 mmHg.  Left Atrium: Left atrial size was normal in size.  Right Atrium: Right atrial size was normal in size.  Pericardium: There is no evidence of pericardial effusion.  Mitral Valve: The mitral valve is normal in structure. Mild mitral valve regurgitation. No evidence of mitral valve stenosis.  Tricuspid Valve: The tricuspid valve is normal in structure. Tricuspid valve regurgitation is mild . No evidence of tricuspid stenosis.  Aortic Valve: The aortic valve is tricuspid. Aortic valve regurgitation is trivial. Mild aortic valve sclerosis is present, with no evidence  of aortic valve stenosis.  Pulmonic Valve: The pulmonic valve was normal in structure. Pulmonic valve regurgitation is trivial. No evidence of pulmonic stenosis.  Aorta: The aortic root is normal in size and structure.  Venous: The inferior vena cava is normal in size with greater than 50% respiratory variability, suggesting right atrial pressure of 3 mmHg.  IAS/Shunts: No atrial level shunt detected by color flow Doppler.   LEFT VENTRICLE PLAX 2D LVIDd:         4.00 cm  Diastology LVIDs:         2.80 cm  LV e' medial:    4.28 cm/s LV PW:         0.90 cm  LV E/e' medial:  23.1 LV IVS:        0.90 cm   LV e' lateral:   5.46 cm/s LVOT diam:     2.00 cm  LV E/e' lateral: 18.1 LV SV:         75 LV SV Index:   44       2D Longitudinal Strain LVOT Area:     3.14 cm 2D Strain GLS (A2C):   -15.6 % 2D Strain GLS (A3C):   -15.9 % 2D Strain GLS (A4C):   -17.2 % 2D Strain GLS Avg:     -16.2 %  3D Volume EF: 3D EF:        58 % LV EDV:       85 ml LV ESV:       36 ml LV SV:        49 ml  RIGHT VENTRICLE RV Basal diam:  3.20 cm RV S prime:     12.20 cm/s TAPSE (M-mode): 1.4 cm RVSP:           38.3 mmHg  LEFT ATRIUM             Index       RIGHT ATRIUM           Index LA diam:        4.00 cm 2.34 cm/m  RA Pressure: 3.00 mmHg LA Vol (A2C):   32.1 ml 18.79 ml/m RA Area:     7.38 cm LA Vol (A4C):   29.9 ml 17.50 ml/m RA Volume:   11.20 ml  6.55 ml/m LA Biplane Vol: 31.7 ml 18.55 ml/m AORTIC VALVE LVOT Vmax:   102.00 cm/s LVOT Vmean:  70.400 cm/s LVOT VTI:    0.239 m  AORTA Ao Root diam: 3.20 cm  MITRAL VALVE                TRICUSPID VALVE TR Peak grad:   35.3 mmHg TR Vmax:        297.00 cm/s MV E velocity: 99.00 cm/s   Estimated RAP:  3.00 mmHg MV Annette velocity: 102.00 cm/s  RVSP:           38.3 mmHg MV E/Annette ratio:  0.97 SHUNTS Systemic VTI:  0.24 m Systemic Diam: 2.00 cm  Redell Shallow MD Electronically signed by Redell Shallow MD Signature Date/Time: 07/08/2020/11:39:54 AM    Final      CT SCANS  CT CORONARY FRACTIONAL FLOW RESERVE DATA PREP 09/21/2021  Narrative EXAM: FFRCT ANALYSIS  FINDINGS: FFRct analysis was performed on the original cardiac CT angiogram dataset. Diagrammatic representation of the FFRct analysis is provided in Annette separate PDF document in PACS. This dictation was created using the PDF document and an interactive 3D model of the results. 3D model is  not available in the EMR/PACS. Normal FFR range is >0.80.  1. Left Main: No significant stenosis. LM FFR = 1.00.  2. LAD: No significant stenosis. Proxial FFR = 0.97, Mid FFR = 0.88, Distal  FFR = 0.80.  3. LCX: No significant stenosis. Proximal FFR = 0.99, Mmid FFR = 0.98. The distal LCx was not modeled.  4. RCA: No significant stenosis. Proximal FFR =0.98, Mid FFR = 0.92, Distal FFR =0.86. The PDA and RPLA were not modeled.  IMPRESSION: 1. Coronary CTA FFR analysis demonstrates no hemodynamically flow limiting lesions but the distal LCx, RPLA and PDA were not modeled. On coronary CTA there was moderate plaque in the mid LCx and distal RCA which correspond to the areas on FFR that could not be modeled.  2. Recommend aggressive medical therapy and consider cath if patient fails medical therapy.  Wilbert Bihari, MD   Electronically Signed By: Wilbert Bihari M.D. On: 09/21/2021 12:17   CT CORONARY MORPH W/CTA COR W/SCORE 09/21/2021  Addendum 09/22/2021  8:01 AM ADDENDUM REPORT: 09/22/2021 07:59  EXAM: OVER-READ INTERPRETATION  CT CHEST  The following report is an over-read performed by radiologist Dr. Toribio Cove [facility] on 09/21/2021. This over-read does not include interpretation of cardiac or coronary anatomy or pathology. The coronary calcium  score and cardiac CTA interpretation by the cardiologist is attached.  COMPARISON:  Chest CTA 07/08/2020.  FINDINGS: Atherosclerotic calcifications in the thoracic aorta. Within the visualized portions of the thorax there are no suspicious appearing pulmonary nodules or masses, there is no acute consolidative airspace disease, no pleural effusions, no pneumothorax and no lymphadenopathy. Visualized portions of the upper abdomen are unremarkable. There are no aggressive appearing lytic or blastic lesions noted in the visualized portions of the skeleton.  IMPRESSION: 1.  Aortic Atherosclerosis (ICD10-I70.0).   Electronically Signed By: Toribio Aye M.D. On: 09/22/2021 07:59  Narrative CLINICAL DATA:  Chest pain  EXAM: Cardiac/Coronary CTA  TECHNIQUE: Annette non-contrast, gated CT scan  was obtained with axial slices of 3 mm through the heart for calcium  scoring. Calcium  scoring was performed using the Agatston method. Annette 120 kV prospective, gated, contrast cardiac scan was obtained. Gantry rotation speed was 250 msecs and collimation was 0.6 mm. Two sublingual nitroglycerin  tablets (0.8 mg) were given. The 3D data set was reconstructed in 5% intervals of the 35-75% of the R-R cycle. Diastolic phases were analyzed on Annette dedicated workstation using MPR, MIP, and VRT modes. The patient received 95 cc of contrast.  FINDINGS: Image quality: Excellent.  Noise artifact is: Limited.  Coronary Arteries:  Normal coronary origin.  Right dominance.  Left main: The left main is Annette large caliber vessel with Annette normal take off from the left coronary cusp that bifurcates to form Annette left anterior descending artery and Annette left circumflex artery. There is mild calcified plaque in the proximal to mid LM with associated stenosis of 25-49%.  Left anterior descending artery: The LAD gives off 2 patent diagonal branches. There is mild calcified plaque in the proximal LAD with associated stenosis of 25-49%. There is moderate calcified plaque in the mid LAD after the takeoff of D1 with associated stenosis of 50-69%.  Left circumflex artery: The LCX is non-dominant and gives off 2 patent obtuse marginal branches. There is mild to moderate calcified plaque in the mid LCx and in Annette large proximal OM2 with associated stenosis of at least 25-49% and may be > 50%.  Right coronary artery: The RCA is dominant with normal  take off from the right coronary cusp. The RCA terminates as Annette PDA and right posterolateral branch. There is moderate mixed plaque in the proximal RCA with associated stenosis of 50-69%. There is moderate calcified plaque in the distal RCA with associated stenosis of 50-69%  Right Atrium: Right atrial size is within normal limits.  Right Ventricle: The right ventricular cavity  is within normal limits.  Left Atrium: Left atrial size is normal in size with no left atrial appendage filling defect. PFO is present.  Left Ventricle: The ventricular cavity size is within normal limits. There are no stigmata of prior infarction. There is no abnormal filling defect.  Pulmonary arteries: Normal in size without proximal filling defect.  Pulmonary veins: Normal pulmonary venous drainage.  Pericardium: Normal thickness with no significant effusion or calcium  present.  Cardiac valves: The aortic valve is trileaflet without significant calcification. The mitral valve is normal structure without significant calcification.  Aorta: Normal caliber with aortic atherosclerosis.  Extra-cardiac findings: See attached radiology report for non-cardiac structures.  IMPRESSION: 1. Coronary calcium  score of 1312. This was 94th percentile for age-, sex, and race-matched controls.  2.  Normal coronary origin with right dominance.  3.  Moderate atherosclerosis.  CAD RADS 3.  4.  PFO is present.  5.  Aortic atherosclerosis.  6. Consider symptom-guided anti-ischemic and preventive pharmacotherapy as well as risk factor modification per guideline-directed care.  7.  This study has been submitted for FFR analysis.  RECOMMENDATIONS: 1. CAD-RADS 0: No evidence of CAD (0%). Consider non-atherosclerotic causes of chest pain.  2. CAD-RADS 1: Minimal non-obstructive CAD (0-24%). Consider non-atherosclerotic causes of chest pain. Consider preventive therapy and risk factor modification.  3. CAD-RADS 2: Mild non-obstructive CAD (25-49%). Consider non-atherosclerotic causes of chest pain. Consider preventive therapy and risk factor modification.  4. CAD-RADS 3: Moderate stenosis. Consider symptom-guided anti-ischemic pharmacotherapy as well as risk factor modification per guideline directed care. Additional analysis with CT FFR will be submitted.  5. CAD-RADS 4: Severe  stenosis. (70-99% or > 50% left main). Cardiac catheterization or CT FFR is recommended. Consider symptom-guided anti-ischemic pharmacotherapy as well as risk factor modification per guideline directed care. Invasive coronary angiography recommended with revascularization per published guideline statements.  6. CAD-RADS 5: Total coronary occlusion (100%). Consider cardiac catheterization or viability assessment. Consider symptom-guided anti-ischemic pharmacotherapy as well as risk factor modification per guideline directed care.  7. CAD-RADS N: Non-diagnostic study. Obstructive CAD can't be excluded. Alternative evaluation is recommended.  Wilbert Bihari, MD  Electronically Signed: By: Wilbert Bihari M.D. On: 09/21/2021 09:37     ______________________________________________________________________________________________      Risk Assessment/Calculations   HYPERTENSION CONTROL Vitals:   01/17/24 0918 01/17/24 1342  BP: (!) 144/64 (!) 140/70    The patient's blood pressure is elevated above target today.  In order to address the patient's elevated BP: Blood pressure will be monitored at home to determine if medication changes need to be made.          Physical Exam VS:  BP (!) 140/70   Pulse 81   Ht 5' 1 (1.549 m)   Wt 157 lb (71.2 kg)   SpO2 97%   BMI 29.66 kg/m        Wt Readings from Last 3 Encounters:  01/17/24 157 lb (71.2 kg)  01/12/24 156 lb (70.8 kg)  01/09/24 156 lb (70.8 kg)    GEN: Well nourished, well developed in no acute distress NECK: No JVD; No carotid bruits CARDIAC: RRR, no  murmurs, rubs, gallops RESPIRATORY:  Clear to auscultation without rales, wheezing or rhonchi  ABDOMEN: Soft, non-tender, non-distended EXTREMITIES:  No edema; No deformity   ASSESSMENT AND PLAN  Severe subclavian artery stenosis: Recent aortic arch angiography showed flush heavily calcified occlusion of the left subclavian artery just before the origin of the left  vertebral artery, left subclavian artery is filled via retrograde flow from the left vertebral artery.  There is some concern that her dizziness may be related to subclavian steal syndrome.  She has been seen by Dr. Serene who questioned whether her symptom is truly coming from subclavian steal syndrome and referred her to neurology service.  She has been seen by Dr. Rosemarie who has ordered MRI of the brain and the neck.  Awaiting final decision by vascular surgery once neurology workup is completed.  CAD: Denies any recent chest pain.  Last cardiac catheterization performed on 10/22/2022 showed 50% mid left circumflex lesion, 50% distal left circumflex lesion, EF greater than 65%.  Hypertension: Blood pressure elevated, avoid dropping her blood pressure due to retrograde blood flow in the vertebral artery and occluded proximal left subclavian artery.  If her blood pressure dropped too low, she may have worsening dizziness.  Hyperlipidemia: Continue simvastatin .       Dispo: Follow-up with Dr. Darron in 6 months.  Signed, Scot Ford, PA

## 2024-01-17 NOTE — Telephone Encounter (Signed)
 Patient has request for refill on Lorazepam . Patient last refill was 06/30/2023. Patient has contract on file dated 10/26/2022. Patient has upcoming appointment 05/24/2024.  Medication pend and sent to PCP Tamera, Amy E, NP) for approval.

## 2024-01-17 NOTE — Patient Instructions (Signed)
 Medication Instructions:  NO CHANGES *If you need a refill on your cardiac medications before your next appointment, please call your pharmacy*  Lab Work: NO LABS If you have labs (blood work) drawn today and your tests are completely normal, you will receive your results only by: MyChart Message (if you have MyChart) OR A paper copy in the mail If you have any lab test that is abnormal or we need to change your treatment, we will call you to review the results.  Testing/Procedures: NO TESTING  Follow-Up: At [facility], you and your health needs are our priority.  As part of our continuing mission to provide you with exceptional heart care, our providers are all part of one team.  This team includes your primary Cardiologist (physician) and Advanced Practice Providers or APPs (Physician Assistants and Nurse Practitioners) who all work together to provide you with the care you need, when you need it.  Your next appointment:   6 month(s)  Provider:   Deatrice Cage, MD  We recommend signing up for the patient portal called MyChart.  Sign up information is provided on this After Visit Summary.  MyChart is used to connect with patients for Virtual Visits (Telemedicine).  Patients are able to view lab/test results, encounter notes, upcoming appointments, etc.  Non-urgent messages can be sent to your provider as well.   To learn more about what you can do with MyChart, go to [email or web address].   Other Instructions KEEP FOLLOW UP WITH VASCULAR SURGERY

## 2024-01-18 NOTE — Telephone Encounter (Addendum)
 Called Annette Suarez and let her know the below Lab Results per Dr. Rosemarie. Annette Suarez stated that she is seeing a Specialist for her Diabetes and she will reach back out to them in regards to her results today.   ----- Message from Eather Rosemarie sent at 01/17/2024  5:45 PM EDT ----- Burna inform the patient that cholesterol profile is satisfactory however hemoglobin A1c test for diabetes is not satisfactory and to see primary care physician for help in managing diabetes more  tightly ----- Message ----- From: Interface, [facility] Lab Results In Sent: 01/13/2024   7:37 AM EDT To: Eather GORMAN Rosemarie, MD

## 2024-01-25 DIAGNOSIS — R29818 Other symptoms and signs involving the nervous system: Secondary | ICD-10-CM | POA: Diagnosis not present

## 2024-01-25 DIAGNOSIS — G458 Other transient cerebral ischemic attacks and related syndromes: Secondary | ICD-10-CM | POA: Diagnosis not present

## 2024-01-25 DIAGNOSIS — R42 Dizziness and giddiness: Secondary | ICD-10-CM | POA: Diagnosis not present

## 2024-01-25 DIAGNOSIS — I6522 Occlusion and stenosis of left carotid artery: Secondary | ICD-10-CM | POA: Diagnosis not present

## 2024-02-01 ENCOUNTER — Telehealth: Payer: Self-pay | Admitting: Neurology

## 2024-02-01 DIAGNOSIS — G458 Other transient cerebral ischemic attacks and related syndromes: Secondary | ICD-10-CM

## 2024-02-01 DIAGNOSIS — R42 Dizziness and giddiness: Secondary | ICD-10-CM

## 2024-02-01 NOTE — Telephone Encounter (Signed)
 Received a MRA angio neck report for the [facility]. Will send to Dr Rosemarie to review and result for the pt   52% stenosis at the left common carotid artery bifurcation

## 2024-02-01 NOTE — Telephone Encounter (Signed)
 Also received MRI head w and without contrast along with MRA angio head w/out contrast

## 2024-02-03 ENCOUNTER — Telehealth: Payer: Self-pay

## 2024-02-03 NOTE — Telephone Encounter (Signed)
 Patient called asking if Dr. Serene has reviewed her MRI results from 02/01/24. Patient advised to follow-up with her neurologist to obtain the results. Patient knows to keep her scheduled appt with Dr. Serene after she sees her neurologist.

## 2024-02-06 ENCOUNTER — Encounter: Admitting: Surgery

## 2024-02-06 NOTE — Telephone Encounter (Signed)
 Called the patient and spoke with her on results for the MRI brain and MRA head and neck results. Advised of the findings and that Dr Rosemarie recommends pt repeat a carotid ultrasound in about 6 mths. Will have her schedule this for January time frame and I got the patient scheduled 08/10/2023 at 10 am for a follow up visit with Dr Rosemarie since he requested her follow up in 6 mths.

## 2024-02-07 ENCOUNTER — Telehealth: Payer: Self-pay | Admitting: Neurology

## 2024-02-07 NOTE — Telephone Encounter (Signed)
 I received reports from MRI scan of the brain, MRI of the brain and MRA of the neck done on 01/25/2024 at [facility] for this patient.  MRI of the neck shows 52% stenosis of the left common carotid artery.  MRI of the brain shows no significant narrowing of any large or medium sized vessels.  MRI scan of the brain with and without contrast shows no acute abnormality and only patchy nonspecific white matter changes.

## 2024-02-08 ENCOUNTER — Ambulatory Visit: Payer: Self-pay | Admitting: *Deleted

## 2024-02-08 ENCOUNTER — Telehealth: Payer: Self-pay

## 2024-02-08 ENCOUNTER — Encounter: Payer: Self-pay | Admitting: Adult Health

## 2024-02-08 ENCOUNTER — Ambulatory Visit (INDEPENDENT_AMBULATORY_CARE_PROVIDER_SITE_OTHER): Admitting: Adult Health

## 2024-02-08 VITALS — BP 118/80 | HR 95 | Temp 97.4°F | Ht 61.0 in | Wt 156.8 lb

## 2024-02-08 DIAGNOSIS — I152 Hypertension secondary to endocrine disorders: Secondary | ICD-10-CM

## 2024-02-08 DIAGNOSIS — R195 Other fecal abnormalities: Secondary | ICD-10-CM | POA: Diagnosis not present

## 2024-02-08 DIAGNOSIS — Z794 Long term (current) use of insulin: Secondary | ICD-10-CM | POA: Diagnosis not present

## 2024-02-08 DIAGNOSIS — E1165 Type 2 diabetes mellitus with hyperglycemia: Secondary | ICD-10-CM

## 2024-02-08 DIAGNOSIS — E1159 Type 2 diabetes mellitus with other circulatory complications: Secondary | ICD-10-CM | POA: Diagnosis not present

## 2024-02-08 DIAGNOSIS — K219 Gastro-esophageal reflux disease without esophagitis: Secondary | ICD-10-CM | POA: Diagnosis not present

## 2024-02-08 LAB — CBC WITH DIFFERENTIAL/PLATELET
Absolute Lymphocytes: 2633 {cells}/uL (ref 850–3900)
Absolute Monocytes: 844 {cells}/uL (ref 200–950)
Basophils Absolute: 91 {cells}/uL (ref 0–200)
Basophils Relative: 0.8 %
Eosinophils Absolute: 239 {cells}/uL (ref 15–500)
Eosinophils Relative: 2.1 %
HCT: 36.6 % (ref 35.0–45.0)
Hemoglobin: 12.5 g/dL (ref 11.7–15.5)
MCH: 31.9 pg (ref 27.0–33.0)
MCHC: 34.2 g/dL (ref 32.0–36.0)
MCV: 93.4 fL (ref 80.0–100.0)
MPV: 11.6 fL (ref 7.5–12.5)
Monocytes Relative: 7.4 %
Neutro Abs: 7592 {cells}/uL (ref 1500–7800)
Neutrophils Relative %: 66.6 %
Platelets: 283 Thousand/uL (ref 140–400)
RBC: 3.92 Million/uL (ref 3.80–5.10)
RDW: 12.2 % (ref 11.0–15.0)
Total Lymphocyte: 23.1 %
WBC: 11.4 Thousand/uL — ABNORMAL HIGH (ref 3.8–10.8)

## 2024-02-08 LAB — BASIC METABOLIC PANEL WITH GFR
BUN/Creatinine Ratio: 26 (calc) — ABNORMAL HIGH (ref 6–22)
BUN: 29 mg/dL — ABNORMAL HIGH (ref 7–25)
CO2: 32 mmol/L (ref 20–32)
Calcium: 9.8 mg/dL (ref 8.6–10.4)
Chloride: 99 mmol/L (ref 98–110)
Creat: 1.12 mg/dL — ABNORMAL HIGH (ref 0.60–0.95)
Glucose, Bld: 86 mg/dL (ref 65–99)
Potassium: 4.1 mmol/L (ref 3.5–5.3)
Sodium: 140 mmol/L (ref 135–146)
eGFR: 50 mL/min/1.73m2 — ABNORMAL LOW (ref >=60)

## 2024-02-08 NOTE — Telephone Encounter (Signed)
 Patient with concerns for black stools that have been going on x 1 week. Advised her to schedule with provider within next day.

## 2024-02-08 NOTE — Telephone Encounter (Signed)
 Copied from CRM #[id number]. Topic: General - Other >> Feb 08, 2024  8:37 AM Susanna ORN wrote: Reason for CRM: Patient requesting to see Dr. Gil. Offered next availability for August 21st and patient declined. Offered sooner appts for August 6th & 7th with other providers in clinic and she declined, stating that she wants to see Dr. Gil. She states she's having soreness around her mid-drift and has been ongoing for a few days. Patient requesting for Dr. Gil to give her a call. CB #: [id number].

## 2024-02-08 NOTE — Telephone Encounter (Signed)
 Routing to [place], Greig BRAVO, NP to review.

## 2024-02-08 NOTE — Progress Notes (Signed)
 [facility]  Provider:  Jereld Serum DNP  Code Status:  Full Code  Goals of Care:     12/28/2023    7:27 AM  Advanced Directives  Does Patient Have a Medical Advance Directive? No  Would patient like information on creating a medical advance directive? No - Patient declined     Chief Complaint  Patient presents with   Diarrhea    dark stools- stopped pepto, loose stolls/leakage for has been going on 5 days.     Discussed the use of AI scribe software for clinical note transcription with the patient, who gave verbal consent to proceed.  HPI: Patient is a 83 y.o. female seen today for an acute visit for dark stools for 4-5 days.  She has experienced black stools daily for the past four to five days. She denies taking any iron supplements or multivitamins containing iron. She has been taking Pepto Bismol tablets and stopped taking them two days ago. No red blood has been observed in her stool.  She experiences pain on her right side that radiates to her lower back, which she attributes to a muscle strain. No abdominal pain is associated with the black stools.  She reports having loose bowel movements and bowel seepage, which led her to take Pepto Bismol. She had a watery stool yesterday or the day before but notes that her stools are no longer watery.  She mentions a decreased appetite, which she attributes to her use of Ozempic . For hypertension, she takes Coreg  12.5 mg twice a day, chlorthalidone  25 mg daily and irbesartan  300 mg daily. For diabetes, she uses semaglutide  0.5 mg injection weekly, Humalog  three times a day before meals with a sliding scale, and Tresiba  60 units daily. Her last A1c was 8.3 on January 12, 2024, down from 8.7 on Nov 30, 2023.  She has a history of coronary artery disease with a blockage that was too severe for stenting. She has undergone an MRI, but her heart doctor has not yet reviewed the results.  No alcohol consumption.    Past Medical  History:  Diagnosis Date   Acute cystitis    UTI   Anxiety    Arthritis    Benign positional vertigo    Dermatitis    Diabetes mellitus without complication (HCC)    Family history of adverse reaction to anesthesia    GERD (gastroesophageal reflux disease)    Goiter, nontoxic, multinodular    Gout    H/O: hysterectomy    History of tobacco use    Hx laparoscopic cholecystectomy    Hypercholesterolemia    Hypertension    Incomplete bladder emptying    Insomnia    Nausea & vomiting 09/08/2017   Postmenopausal atrophic vaginitis    RA (rheumatoid arthritis) (HCC)    Rectocele    Renal calculus    Skin cancer    Sleep apnea    Thyroid  condition    Type 2 diabetes mellitus (HCC)    Urolithiasis    Vaginal enterocele     Past Surgical History:  Procedure Laterality Date   AORTIC ARCH ANGIOGRAPHY N/A 10/22/2022   Procedure: AORTIC ARCH ANGIOGRAPHY;  Surgeon: Dann Candyce RAMAN, MD;  Location: [facility];  Service: Cardiovascular;  Laterality: N/A;   AORTIC ARCH ANGIOGRAPHY N/A 12/28/2023   Procedure: AORTIC ARCH ANGIOGRAPHY;  Surgeon: Darron Deatrice LABOR, MD;  Location: [facility];  Service: Cardiovascular;  Laterality: N/A;   CARPAL TUNNEL RELEASE  2018  CATARACT EXTRACTION, BILATERAL     CHOLECYSTECTOMY     COLONOSCOPY     07/11/2009, 02/2015   GALLBLADDER SURGERY     KNEE ARTHROSCOPY Right 05/20/2020   Procedure: RIGHT KNEE ARTHROSCOPY AND DEBRIDEMENT;  Surgeon: Harden Jerona GAILS, MD;  Location: [facility];  Service: Orthopedics;  Laterality: Right;   PARTIAL HYSTERECTOMY     Overies left intact   RIGHT/LEFT HEART CATH AND CORONARY ANGIOGRAPHY N/A 10/22/2022   Procedure: RIGHT/LEFT HEART CATH AND CORONARY ANGIOGRAPHY;  Surgeon: Dann Candyce RAMAN, MD;  Location: [facility];  Service: Cardiovascular;  Laterality: N/A;   SQUAMOUS CELL CARCINOMA EXCISION  05/2018    Allergies  Allergen Reactions   Latex Rash   Amaryl [Glimepiride]      Don't Remember   Amlodipine      LE edema on 7.5mg  daily. Tolerates 5mg  daily just fine   Codeine     SICK   Jardiance [Empagliflozin]     Urinary tract infection- SIDE EFFECT   Pneumovax 23 [Pneumococcal Vac Polyvalent]     Red / Swelling / Hot to touch SIDE EFFECTS   Wellbutrin [Bupropion]     Unknown    Outpatient Encounter Medications as of 02/08/2024  Medication Sig   Accu-Chek Softclix Lancets lancets Use to test blood sugar three times daily. Dx: E11.8   aspirin  EC 81 MG tablet Take 81 mg by mouth daily.   Biotin 1 MG CAPS Take 1 mg by mouth daily.   carvedilol  (COREG ) 12.5 MG tablet Take 1 tablet (12.5 mg total) by mouth 2 (two) times daily.   cetirizine  (ZYRTEC ) 10 MG tablet Take 1 tablet (10 mg total) by mouth daily.   chlorthalidone  (HYGROTON ) 25 MG tablet TAKE 1 TABLET BY MOUTH DAILY   Cholecalciferol (VITAMIN D3) 50 MCG (2000 UT) capsule Take 2,000 Units by mouth daily.   clobetasol cream (TEMOVATE) 0.05 % Apply 1 Application topically daily as needed (Rash).   Continuous Blood Gluc Receiver (FREESTYLE LIBRE 2 READER) DEVI    Continuous Glucose Sensor (FREESTYLE LIBRE 2 SENSOR) MISC APPLY SENSOR TO SKIN FOR CONTINUOUS BLOOD SUGAR MONITORING, REPLACE EVERY 14 DAYS   Diclofenac Sodium (VOLTAREN EX) Apply 1 application  topically 3 (three) times daily as needed (Arthritis).   estradiol  (ESTRACE ) 0.1 MG/GM vaginal cream Place 1 Applicatorful vaginally 3 (three) times a week.   etanercept  (ENBREL  SURECLICK) 50 MG/ML injection Inject 50 mg into the skin once a week.   fluocinonide  (LIDEX ) 0.05 % external solution Apply 1 Application topically daily as needed.   insulin  degludec (TRESIBA  FLEXTOUCH) 200 UNIT/ML FlexTouch Pen Inject 60 Units into the skin daily in the afternoon.   insulin  lispro (HUMALOG  KWIKPEN) 100 UNIT/ML KwikPen Start CF: Humalog  (BG-130/35) TIDQAC and at bedtime  Max daily 30 units   Insulin  Pen Needle 31G X 5 MM MISC 1 Device by Does not apply route in the  morning, at noon, in the evening, and at bedtime.   irbesartan  (AVAPRO ) 300 MG tablet TAKE 1 TABLET BY MOUTH DAILY   loperamide (IMODIUM A-D) 2 MG tablet Take 2 mg by mouth daily as needed for diarrhea or loose stools.   LORazepam  (ATIVAN ) 0.5 MG tablet TAKE 1 TABLET BY MOUTH AT BEDTIME   MAGNESIUM CITRATE PO Take 250 mg by mouth daily.   nitroGLYCERIN  (NITROSTAT ) 0.4 MG SL tablet Place 1 tablet (0.4 mg total) under the tongue every 5 (five) minutes as needed for chest pain.   Omeprazole 20 MG TBDD Take 20  mg by mouth daily.   ondansetron  (ZOFRAN ) 4 MG tablet TAKE 1 TABLET BY MOUTH 3 TIMES A DAY AS NEEDED FOR NAUSEA AND/OR VOMITING   Probiotic Product (PROBIOTIC PO) Take 1 tablet by mouth daily.   Propylene Glycol (SYSTANE COMPLETE OP) Place 1-2 drops into both eyes daily as needed (Dry eye).   Semaglutide ,0.25 or 0.5MG /DOS, (OZEMPIC , 0.25 OR 0.5 MG/DOSE,) 2 MG/1.5ML SOPN Inject 0.5 mg into the skin once a week.   simvastatin  (ZOCOR ) 20 MG tablet TAKE ONE TABLET BY MOUTH EVERY EVENING   [DISCONTINUED] ALPRAZolam  (XANAX ) 0.5 MG tablet Take 1 tablet (0.5 mg total) by mouth See admin instructions. Take 1 tablet 30 mins prior to mri and may repeat x 1 if needed (Patient not taking: Reported on 02/08/2024)   No facility-administered encounter medications on file as of 02/08/2024.    Review of Systems:  Review of Systems  Constitutional:  Negative for appetite change, chills, fatigue and fever.  HENT:  Negative for congestion, hearing loss, rhinorrhea and sore throat.   Eyes: Negative.   Respiratory:  Negative for cough, shortness of breath and wheezing.   Cardiovascular:  Negative for chest pain, palpitations and leg swelling.  Gastrointestinal:  Negative for abdominal pain, constipation, diarrhea, nausea and vomiting.       Dark stools  Genitourinary:  Negative for dysuria.  Musculoskeletal:  Negative for arthralgias, back pain and myalgias.  Skin:  Negative for color change, rash and wound.   Neurological:  Negative for dizziness, weakness and headaches.  Psychiatric/Behavioral:  Negative for behavioral problems. The patient is not nervous/anxious.     Health Maintenance  Topic Date Due   DTaP/Tdap/Td (3 - Td or Tdap) 05/02/2023   INFLUENZA VACCINE  02/03/2024   Diabetic kidney evaluation - Urine ACR  04/13/2024   Medicare Annual Wellness (AWV)  05/18/2024   HEMOGLOBIN A1C  07/14/2024   FOOT EXAM  08/28/2024   OPHTHALMOLOGY EXAM  09/18/2024   Diabetic kidney evaluation - eGFR measurement  12/12/2024   Pneumococcal Vaccine: 50+ Years  Completed   DEXA SCAN  Completed   Hepatitis B Vaccines  Aged Out   HPV VACCINES  Aged Out   Meningococcal B Vaccine  Aged Out   COVID-19 Vaccine  Discontinued   Hepatitis C Screening  Discontinued   Zoster Vaccines- Shingrix  Discontinued    Physical Exam: Vitals:   02/08/24 1123  BP: 118/80  Pulse: 95  Temp: (!) 97.4 F (36.3 C)  SpO2: 98%  Weight: 156 lb 12.8 oz (71.1 kg)  Height: 5' 1 (1.549 m)   Body mass index is 29.63 kg/m. Physical Exam Constitutional:      Appearance: Normal appearance.  HENT:     Head: Normocephalic and atraumatic.     Nose: Nose normal.     Mouth/Throat:     Mouth: Mucous membranes are moist.  Eyes:     Conjunctiva/sclera: Conjunctivae normal.  Cardiovascular:     Rate and Rhythm: Normal rate and regular rhythm.  Pulmonary:     Effort: Pulmonary effort is normal.     Breath sounds: Normal breath sounds.  Abdominal:     General: Bowel sounds are normal.     Palpations: Abdomen is soft.  Genitourinary:    Rectum: Guaiac result negative.     Comments: Has external hemorrhoids Musculoskeletal:        General: Normal range of motion.     Cervical back: Normal range of motion.  Skin:    General: Skin is  warm and dry.  Neurological:     General: No focal deficit present.     Mental Status: She is alert and oriented to person, place, and time.  Psychiatric:        Mood and Affect: Mood  normal.        Behavior: Behavior normal.        Thought Content: Thought content normal.        Judgment: Judgment normal.     Labs reviewed: Basic Metabolic Panel: Recent Labs    08/18/23 1343 10/31/23 1103 12/13/23 0850  NA 139 138 139  K 4.4 4.6 4.3  CL 100 99 101  CO2 24 31 23   GLUCOSE 198* 252* 130*  BUN 24 26* 28*  CREATININE 1.18* 1.14* 1.23*  CALCIUM  9.4 9.5 9.0  TSH 2.16  --   --    Liver Function Tests: Recent Labs    04/14/23 0944 08/18/23 1343 10/31/23 1103  AST 10 13 11   ALT 10 10 9   BILITOT 0.9 1.2 1.2  PROT 6.7 6.7 6.9   No results for input(s): LIPASE, AMYLASE in the last 8760 hours. No results for input(s): AMMONIA in the last 8760 hours. CBC: Recent Labs    04/14/23 0944 08/18/23 1343 10/31/23 1103 12/13/23 0850  WBC 10.0 11.3* 9.7 9.1  NEUTROABS 6,900 7,605 6,819  --   HGB 12.8 12.5 12.9 12.0  HCT 38.3 36.5 39.1 35.3  MCV 94.8 93.1 94.9 94  PLT 299 299 290 303   Lipid Panel: Recent Labs    04/14/23 0944 01/12/24 1223  CHOL 153 111  HDL 38* 37*  LDLCALC 86 48  TRIG 195* 153*  CHOLHDL 4.0 3.0   Lab Results  Component Value Date   HGBA1C 8.3 (H) 01/12/2024    Procedures since last visit: No results found.  Assessment/Plan  1. Dark stools (Primary) -  Dark stools likely due to Pepto Bismol.  -  Rectal exam done -   Negative stool occult blood test indicates no active GI bleeding. - Order CBC to check hemoglobin levels. - Instruct to stop taking Pepto Bismol. - CBC with Differential/Platelets - Basic metabolic panel  2. Hypertension associated with diabetes (HCC) -  Hypertension well-controlled with current medications. - Continue Coreg  12.5 mg twice a day - Continue chlorthalidone  25 mg 1 tab daily  Continue irbesartan  300 mg 1 tab daily-     3. Type 2 diabetes mellitus with hyperglycemia, with long-term current use of insulin  (HCC) -  managed with semaglutide , Humalog , and Tresiba . A1c improved to  8.3%.  4. Gastroesophageal reflux disease without esophagitis -  managed with omeprazole. - Continue omeprazole 20 mg daily.       Labs/tests ordered:  CBC and BMP   Return if symptoms worsen or fail to improve.  Leib Elahi Medina-Vargas, NP

## 2024-02-08 NOTE — Telephone Encounter (Signed)
 FYI Only or Action Required?: FYI only for provider.  Patient was last seen in primary care on 07/21/2023 by Gil Greig BRAVO, NP.  Called Nurse Triage reporting dark stool.  Symptoms began several days ago.  Interventions attempted: OTC medications: pepto tablets.  Symptoms are: unchanged.  Triage Disposition: See PCP When Office is Open (Within 3 Days)  Patient/caregiver understands and will follow disposition?: Appointment scheduled   Reason for Disposition  [1] Suspected food (or medicine) is eliminated AND [2] abnormal color persists > 48 hours  Answer Assessment - Initial Assessment Questions 1. COLOR: What color is your stool? Is that color in part or all of the stool? (e.g., black, clay-colored, green, red)      black 2. ONSET: When did you first notice this color change?     4-5 days- normally 1 BM daily- some leakage reported 3. CAUSE: Have you eaten any food or taken any medicine of this color? Note: See listing in Background Information section.     Pepto tablet 4. OTHER SYMPTOMS: Do you have any other symptoms? (e.g., abdomen pain, diarrhea, fever, yellow eyes or skin).     Back pain/side- 1 week, loose stool present and patient using Pepto tablets.  Patient scheduled appointment for underlying issue- loose stool/leakage  Protocols used: Stools - Unusual Color-A-AH  Copied from CRM #[id number]. Topic: Clinical - Red Word Triage >> Feb 08, 2024 10:20 AM Miquel SAILOR wrote: Red Word that prompted transfer to Nurse Triage: black stool concerns 4-5days everytime when she goesto restroom

## 2024-02-13 ENCOUNTER — Ambulatory Visit: Admitting: Internal Medicine

## 2024-02-13 ENCOUNTER — Encounter: Payer: Self-pay | Admitting: Internal Medicine

## 2024-02-13 VITALS — BP 120/84 | HR 81 | Ht 61.0 in | Wt 156.0 lb

## 2024-02-13 DIAGNOSIS — N1831 Chronic kidney disease, stage 3a: Secondary | ICD-10-CM | POA: Diagnosis not present

## 2024-02-13 DIAGNOSIS — E1165 Type 2 diabetes mellitus with hyperglycemia: Secondary | ICD-10-CM

## 2024-02-13 DIAGNOSIS — Z794 Long term (current) use of insulin: Secondary | ICD-10-CM

## 2024-02-13 DIAGNOSIS — E1122 Type 2 diabetes mellitus with diabetic chronic kidney disease: Secondary | ICD-10-CM

## 2024-02-13 DIAGNOSIS — E1142 Type 2 diabetes mellitus with diabetic polyneuropathy: Secondary | ICD-10-CM

## 2024-02-13 MED ORDER — FREESTYLE LIBRE 3 READER DEVI: 1.0000 | Freq: Every day | 0 refills | Status: DC

## 2024-02-13 MED ORDER — FREESTYLE LIBRE 3 PLUS SENSOR MISC: 1.0000 | 3 refills | Status: AC

## 2024-02-13 NOTE — Patient Instructions (Signed)
 Continue Tresiba  60 units ONCE Daily  Continue Ozempic  0.5 mg weekly  Take Humalog  6 units with each meal PLUS add extra if needed depending on the scale  Humalog  correctional insulin : Use the scale below to help guide you before each meal and bedtime   Blood sugar before meal Number of units to inject  Less than 165 0 unit  166 -  200 1 units  201 -  235 2 units  236 -  270 3 units  271 -  305 4 units  306 -  340 5 units  341 -  375 6 units  376 -  410 7 units  411 -  445 8 units     HOW TO TREAT LOW BLOOD SUGARS (Blood sugar LESS THAN 70 MG/DL) Please follow the RULE OF 15 for the treatment of hypoglycemia treatment (when your (blood sugars are less than 70 mg/dL)   STEP 1: Take 15 grams of carbohydrates when your blood sugar is low, which includes:  3-4 GLUCOSE TABS  OR 3-4 OZ OF JUICE OR REGULAR SODA OR ONE TUBE OF GLUCOSE GEL    STEP 2: RECHECK blood sugar in 15 MINUTES STEP 3: If your blood sugar is still low at the 15 minute recheck --> then, go back to STEP 1 and treat AGAIN with another 15 grams of carbohydrates.

## 2024-02-13 NOTE — Progress Notes (Signed)
 Name: Annette Suarez  Age/ Sex: 83 y.o., female   MRN/ DOB: [id number], December 06, 1940     PCP: Gil Greig BRAVO, NP   Reason for Endocrinology Evaluation: Type 2 Diabetes Mellitus  Initial Endocrine Consultative Visit: 05/11/2021    PATIENT IDENTIFIER: Annette Suarez is a 83 y.o. female with a past medical history of T2DM, HTN, RA and dyslipidemia. The patient has followed with Endocrinology clinic since 05/11/2021 for consultative assistance with management of her diabetes.  DIABETIC HISTORY:  Annette Suarez was diagnosed with DM in ~ 1995 . She is intolerant to Metformin- nausea and Jardiance due to UTI's .Her hemoglobin A1c has ranged from 7.5% in 2021, peaking at 8.6% in 2023.  Transitioned care from Dr. Kassie 08/2021  Started trulicity  in 05/2021  THYROID  HISTORY : She has been diagnosed with MNG based on thyroid  ultrasound in 2011 showing multiple thyroid  nodules. She is s/p benign cytology of the left middle and left inferior nodules in 2011. Ultrasound in 2021 confirmed at least a 5 year stability    SUBJECTIVE:   During the last visit (11/30/2023): A1c 8.7 %     Today (02/13/2024): Annette Suarez is here to establish care for diabetes. She has been checking blood sugars multiple times daily through CGM. The patient has  had hypoglycemic episodes since the last clinic visit. She is symptomatic with these episodes.   Patient follows with cardiology for left subclavian artery stenosis, she underwent aortic arch angiography 12/28/2023 She continues to follow-up with rheumatology (Dr. Monna) for seronegative rheumatoid arthritis, and osteoarthritis  She injured her back a couple of week ago and continues with right sided back pain, had a follow up with PCP No nausea except rarely after supper  Denies constipation or diarrhea    H/OME DIABETES REGIMEN:  Tresiba  60 units daily  Ozempic  0.5 mg weekly ( Weekly)  Humalog  6 units TIDQAC CF: Humalog  (BG-130/35)     Statin: yes ACE-I/ARB:  yes     CONTINUOUS GLUCOSE MONITORING RECORD INTERPRETATION    Dates of Recording:7/28-8/04/2024  Sensor description:freestyle libre  Results statistics:   CGM use % of time 86  Average and SD 237/25.4  Time in range 19 %  % Time Above 180 41  % Time above 250 40  % Time Below target 0     Glycemic patterns summary: BGs are high overnight and fluctuate during the day  Hyperglycemic episodes mostly postprandial and overnight  Hypoglycemic episodes occurred N/A  Overnight periods: Remains high   DIABETIC COMPLICATIONS: Microvascular complications:  CKD III Denies: retinopathy, neuropathy  Last Eye Exam: Completed 07/2021  Macrovascular complications:   Denies: CAD, CVA, PVD   HISTORY:  Past Medical History:  Past Medical History:  Diagnosis Date   Acute cystitis    UTI   Anxiety    Arthritis    Benign positional vertigo    Dermatitis    Diabetes mellitus without complication (HCC)    Family history of adverse reaction to anesthesia    GERD (gastroesophageal reflux disease)    Goiter, nontoxic, multinodular    Gout    H/O: hysterectomy    History of tobacco use    Hx laparoscopic cholecystectomy    Hypercholesterolemia    Hypertension    Incomplete bladder emptying    Insomnia    Nausea & vomiting 09/08/2017   Postmenopausal atrophic vaginitis    RA (rheumatoid arthritis) (HCC)    Rectocele    Renal calculus    Skin cancer  Sleep apnea    Thyroid  condition    Type 2 diabetes mellitus (HCC)    Urolithiasis    Vaginal enterocele    Past Surgical History:  Past Surgical History:  Procedure Laterality Date   AORTIC ARCH ANGIOGRAPHY N/A 10/22/2022   Procedure: AORTIC ARCH ANGIOGRAPHY;  Surgeon: Dann Candyce RAMAN, MD;  Location: [facility] INVASIVE CV LAB;  Service: Cardiovascular;  Laterality: N/A;   AORTIC ARCH ANGIOGRAPHY N/A 12/28/2023   Procedure: AORTIC ARCH ANGIOGRAPHY;  Surgeon: Darron Deatrice LABOR, MD;  Location: [facility] INVASIVE CV LAB;  Service:  Cardiovascular;  Laterality: N/A;   CARPAL TUNNEL RELEASE  2018   CATARACT EXTRACTION, BILATERAL     CHOLECYSTECTOMY     COLONOSCOPY     07/11/2009, 02/2015   GALLBLADDER SURGERY     KNEE ARTHROSCOPY Right 05/20/2020   Procedure: RIGHT KNEE ARTHROSCOPY AND DEBRIDEMENT;  Surgeon: Harden Jerona GAILS, MD;  Location: [facility];  Service: Orthopedics;  Laterality: Right;   PARTIAL HYSTERECTOMY     Overies left intact   RIGHT/LEFT HEART CATH AND CORONARY ANGIOGRAPHY N/A 10/22/2022   Procedure: RIGHT/LEFT HEART CATH AND CORONARY ANGIOGRAPHY;  Surgeon: Dann Candyce RAMAN, MD;  Location: [facility] INVASIVE CV LAB;  Service: Cardiovascular;  Laterality: N/A;   SQUAMOUS CELL CARCINOMA EXCISION  05/2018   Social History:  reports that she quit smoking about 45 years ago. Her smoking use included cigarettes. She started smoking about 55 years ago. She has a 2 pack-year smoking history. She has been exposed to tobacco smoke. She has never used smokeless tobacco. She reports that she does not drink alcohol and does not use drugs. Family History:  Family History  Problem Relation Age of Onset   Diabetes Mother    Heart disease Father 18   Diabetes Son    Crohn's disease Son      HOME MEDICATIONS: Allergies as of 02/13/2024       Reactions   Latex Rash   Amaryl [glimepiride]    Don't Remember   Amlodipine     LE edema on 7.5mg  daily. Tolerates 5mg  daily just fine   Codeine    SICK   Jardiance [empagliflozin]    Urinary tract infection- SIDE EFFECT   Pneumovax 23 [pneumococcal Vac Polyvalent]    Red / Swelling / Hot to touch SIDE EFFECTS   Wellbutrin [bupropion]    Unknown        Medication List        Accurate as of February 13, 2024 11:39 AM. If you have any questions, ask your nurse or doctor.          Accu-Chek Softclix Lancets lancets Use to test blood sugar three times daily. Dx: E11.8   aspirin  EC 81 MG tablet Take 81 mg by mouth daily.   Biotin 1 MG Caps Take 1 mg  by mouth daily.   carvedilol  12.5 MG tablet Commonly known as: COREG  Take 1 tablet (12.5 mg total) by mouth 2 (two) times daily.   cetirizine  10 MG tablet Commonly known as: ZYRTEC  Take 1 tablet (10 mg total) by mouth daily.   chlorthalidone  25 MG tablet Commonly known as: HYGROTON  TAKE 1 TABLET BY MOUTH DAILY   clobetasol cream 0.05 % Commonly known as: TEMOVATE Apply 1 Application topically daily as needed (Rash).   Enbrel  SureClick 50 MG/ML injection Generic drug: etanercept  Inject 50 mg into the skin once a week.   estradiol  0.1 MG/GM vaginal cream Commonly known as: ESTRACE  Place 1 Applicatorful vaginally 3 (  three) times a week.   fluocinonide  0.05 % external solution Commonly known as: LIDEX  Apply 1 Application topically daily as needed.   FreeStyle Libre 2 Reader [employer] 2 Sensor Misc APPLY SENSOR TO SKIN FOR CONTINUOUS BLOOD SUGAR MONITORING, REPLACE EVERY 14 DAYS   insulin  lispro 100 UNIT/ML KwikPen Commonly known as: HumaLOG  KwikPen Start CF: Humalog  (BG-130/35) TIDQAC and at bedtime  Max daily 30 units   Insulin  Pen Needle 31G X 5 MM Misc 1 Device by Does not apply route in the morning, at noon, in the evening, and at bedtime.   irbesartan  300 MG tablet Commonly known as: AVAPRO  TAKE 1 TABLET BY MOUTH DAILY   loperamide 2 MG tablet Commonly known as: IMODIUM A-D Take 2 mg by mouth daily as needed for diarrhea or loose stools.   LORazepam  0.5 MG tablet Commonly known as: ATIVAN  TAKE 1 TABLET BY MOUTH AT BEDTIME   MAGNESIUM CITRATE PO Take 250 mg by mouth daily.   nitroGLYCERIN  0.4 MG SL tablet Commonly known as: NITROSTAT  Place 1 tablet (0.4 mg total) under the tongue every 5 (five) minutes as needed for chest pain.   omeprazole 20 MG Tbdd disintegrating tablet Take 20 mg by mouth daily.   ondansetron  4 MG tablet Commonly known as: ZOFRAN  TAKE 1 TABLET BY MOUTH 3 TIMES A DAY AS NEEDED FOR NAUSEA AND/OR VOMITING   Ozempic   (0.25 or 0.5 MG/DOSE) 2 MG/1.5ML Sopn Generic drug: Semaglutide (0.25 or 0.5MG /DOS) Inject 0.5 mg into the skin once a week.   PROBIOTIC PO Take 1 tablet by mouth daily.   simvastatin  20 MG tablet Commonly known as: ZOCOR  TAKE ONE TABLET BY MOUTH EVERY EVENING   SYSTANE COMPLETE OP Place 1-2 drops into both eyes daily as needed (Dry eye).   Tresiba  FlexTouch 200 UNIT/ML FlexTouch Pen Generic drug: insulin  degludec Inject 60 Units into the skin daily in the afternoon.   Vitamin D3 50 MCG (2000 UT) capsule Take 2,000 Units by mouth daily.   VOLTAREN EX Apply 1 application  topically 3 (three) times daily as needed (Arthritis).         OBJECTIVE:   Vital Signs: BP 120/84 (BP Location: Left Arm, Patient Position: Sitting, Cuff Size: Normal)   Pulse 81   Ht 5' 1 (1.549 m)   Wt 156 lb (70.8 kg)   SpO2 98%   BMI 29.48 kg/m   Wt Readings from Last 3 Encounters:  02/13/24 156 lb (70.8 kg)  02/08/24 156 lb 12.8 oz (71.1 kg)  01/17/24 157 lb (71.2 kg)     Exam: General: Pt appears well and is in NAD  Neck: General: Supple without adenopathy. Thyroid : Thyroid  is prominent on the right  Lungs: Clear with good BS bilat   Heart: RRR with norm   Extremities: No pretibial edema.  Neuro: MS is good with appropriate affect, pt is alert and Ox3   DM Foot Exam 08/29/2023  The skin of the feet is intact without sores or ulcerations. The pedal pulses are 2+ on right and 2+ on left. The sensation is decreased  to a screening 5.07, 10 gram monofilament bilaterally    DATA REVIEWED:  Lab Results  Component Value Date   HGBA1C 8.3 (H) 01/12/2024   HGBA1C 8.7 (A) 11/30/2023   HGBA1C 8.7 (A) 08/29/2023    Latest Reference Range & Units 02/08/24 16:03  Sodium 135 - 146 mmol/L 140  Potassium 3.5 - 5.3 mmol/L 4.1  Chloride 98 - 110 mmol/L 99  CO2 20 - 32 mmol/L 32  Glucose 65 - 99 mg/dL 86  BUN 7 - 25 mg/dL 29 (H)  Creatinine 9.39 - 0.95 mg/dL 8.87 (H)  Calcium  8.6 - 10.4  mg/dL 9.8  BUN/Creatinine Ratio 6 - 22 (calc) 26 (H)  eGFR > OR = 60 mL/min/1.61m2 50 (L)  (H): Data is abnormally high (L): Data is abnormally low   ASSESSMENT / PLAN / RECOMMENDATIONS:   1) Type 2 Diabetes Mellitus, Poorly  controlled, With CKD III and neuropathic complications - Most recent A1c of 8.3 %. Goal A1c < 7.5 %.    -A1c continues to be above goal, in reviewing freestyle libre, patient has been noted with postprandial hyperglycemia especially after dinner.  She is not consistently taking Humalog  before each meal, I again explained to the patient the importance of taking Humalog  before each meal -She seems to be overwhelmed with multiple daily injections of insulin , I have asked her to just take Humalog  6 units if she is unable to check glucose and look at the correction scale - She is on pt assistance for Ozempic , she has declined increasing the dose in the past - Limited exercise due to knee pains - She is intolerant to [place]  -New prescription for freestyle libre 3 plus has been sent to the pharmacy  MEDICATIONS: Continue  Ozempic  0.5 mg weekly(samples) Continue Tresiba  60 units daily Increase Humalog  6 units with each meal  Continue CF: Humalog  (BG-130/35) TIDQAC and QHS  EDUCATION / INSTRUCTIONS: BG monitoring instructions: Patient is instructed to check her blood sugars 2 times a day, fasting and suppertime. Call [facility] if: BG persistently < 70  I reviewed the Rule of 15 for the treatment of hypoglycemia in detail with the patient. Literature supplied.   2) Diabetic complications:  Eye: Does not have known diabetic retinopathy.  Neuro/ Feet: Does have known diabetic peripheral neuropathy .  Renal: Patient does  have known baseline CKD. She   is  on an ACEI/ARB at present.    3) Dyslipidemia :   -  LDL  has been at goal , no change    Medication  Simvastatin  20 mg daily     F/U in 4 months    I spent 25 minutes preparing to  see the patient by review of recent labs, imaging and procedures, obtaining and reviewing separately obtained history, communicating with the patient, ordering medications, tests or procedures, and documenting clinical information in the EHR including the differential Dx, treatment, and any further evaluation and other management       Signed electronically by: Stefano Redgie Butts, MD  [facility] Endocrinology  [facility] [address] [place] [place], [place] [address] Phone: [phone] FAX: [phone]   CC: Gil Greig BRAVO, NP [address] [place] [address] Phone: [phone]  Fax: [phone]  Return to Endocrinology clinic as below: Future Appointments  Date Time Provider Department Center  04/03/2024 11:20 AM Dolphus Reiter, MD [facility] None  04/09/2024  3:20 PM Serene Gaile ORN, MD [facility]  05/24/2024 10:40 AM Gil Greig BRAVO, NP [facility] None  07/31/2024 10:00 AM [facility]  1 - OUTPATIENT ONLY [facility]  08/09/2024 10:00 AM Rosemarie Eather RAMAN, MD [facility] None  12/10/2024  3:30 PM Rosemarie Eather RAMAN, MD [facility] None

## 2024-02-14 ENCOUNTER — Other Ambulatory Visit: Payer: Self-pay | Admitting: Orthopedic Surgery

## 2024-02-14 ENCOUNTER — Ambulatory Visit: Payer: Self-pay | Admitting: Adult Health

## 2024-02-14 DIAGNOSIS — F419 Anxiety disorder, unspecified: Secondary | ICD-10-CM

## 2024-02-14 DIAGNOSIS — G4709 Other insomnia: Secondary | ICD-10-CM

## 2024-02-14 NOTE — Telephone Encounter (Signed)
 Patient has request for refill on 02/14/24. Patient last refill was 01/17/24. Patient has upcoming appointment 05/24/24. Updated contract/Sign contract was added to appointment notes. Medication pend and sent to PCP Tamera, Amy E, NP) for approval.

## 2024-02-14 NOTE — Progress Notes (Signed)
-     wbc 11.4, slightly elevated, are you having any fever, cough or congeston -  hemoglobin normal, no anemia -  electrolytes normal -  kidney function stable.

## 2024-03-06 ENCOUNTER — Ambulatory Visit: Admitting: Internal Medicine

## 2024-03-20 NOTE — Progress Notes (Signed)
 Office Visit Note  Patient: Annette Suarez             Date of Birth: 21-Jan-1941           MRN: [id number]             PCP: Gil Greig BRAVO, NP Referring: Gil Greig BRAVO, NP Visit Date: 04/03/2024 Occupation: Data Unavailable  Subjective:  Medication management  History of Present Illness: Annette Suarez is a 83 y.o. female with seronegative rheumatoid arthritis, osteoarthritis and degenerative disc disease.  She has been on Enbrel  50 mg subcu weekly without any interruption.  She has not noticed any joint swelling.  She has been experiencing some radiculopathy to her left arm for which she had extensive cardiology workup and also neurology workup.  Some of the workup is still pending.  She has not noticed any joint swelling.  Patient was evaluated by the cardiologist in July 2025.  She had and angiogram which showed flush heavily calcified occlusion of the left subclavian artery just before the origin of the left vertebral artery.  There was question about radiculopathy in her left arm could be coming from her cervical spine.  She also has underlying coronary artery disease.  Patient states she was also evaluated by neurology and had MRI of her cervical spine and brain.  The carotid scan is pending.  Activities of Daily Living:  Patient reports morning stiffness for 2-3 hours.   Patient Denies nocturnal pain.  Difficulty dressing/grooming: Denies Difficulty climbing stairs: Reports Difficulty getting out of chair: Reports Difficulty using hands for taps, buttons, cutlery, and/or writing: Reports  Review of Systems  Constitutional:  Positive for fatigue.  HENT:  Negative for mouth sores and mouth dryness.   Eyes:  Positive for dryness.  Respiratory:  Positive for shortness of breath.        On exertion   Cardiovascular:  Negative for chest pain and palpitations.  Gastrointestinal:  Positive for nausea. Negative for blood in stool, constipation and diarrhea.  Endocrine: Negative for  increased urination.  Genitourinary:  Negative for involuntary urination.  Musculoskeletal:  Positive for joint pain, gait problem, joint pain, muscle weakness and morning stiffness. Negative for joint swelling, myalgias, muscle tenderness and myalgias.  Skin:  Positive for hair loss and sensitivity to sunlight. Negative for color change and rash.  Allergic/Immunologic: Negative for susceptible to infections.  Neurological:  Positive for dizziness and headaches.  Hematological:  Negative for swollen glands.  Psychiatric/Behavioral:  Positive for depressed mood. Negative for sleep disturbance. The patient is nervous/anxious.     PMFS History:  Patient Active Problem List   Diagnosis Date Noted   Type 2 diabetes mellitus with diabetic polyneuropathy, with long-term current use of insulin  (HCC) 08/29/2023   Type 2 diabetes mellitus with hyperglycemia, with long-term current use of insulin  (HCC) 08/29/2023   Coronary artery disease of native artery of native heart with stable angina pectoris 01/27/2022   Hyperlipidemia associated with type 2 diabetes mellitus (HCC) 01/27/2022   Diabetes mellitus with coincident hypertension (HCC) 08/13/2021   Type 2 diabetes mellitus with stage 3a chronic kidney disease, with long-term current use of insulin  (HCC) 08/13/2021   Multinodular goiter 08/13/2021   Subclavian arterial stenosis 09/11/2020   Aortic atherosclerosis 09/11/2020   Alteration in blood pressure 06/11/2020   Old complex tear of medial meniscus of right knee    Old complex tear of lateral meniscus of right knee    Primary osteoarthritis of both feet 12/13/2017  Dehydration 09/09/2017   Acute lower UTI 09/09/2017   Nausea & vomiting 09/08/2017   Primary osteoarthritis of both hands 04/05/2017   Left carpal tunnel syndrome 11/15/2016   Rheumatoid arthritis of multiple sites with negative rheumatoid factor (HCC) 10/06/2016   High risk medication use 10/06/2016   Dyslipidemia 10/06/2016    Pain in both hands 09/08/2016   Hypertension associated with diabetes (HCC) 09/08/2016   Type 2 diabetes mellitus  09/08/2016   Gastroesophageal reflux disease without esophagitis 09/08/2016   Osteopenia of multiple sites 09/08/2016   History of anxiety 09/08/2016   Benign paroxysmal positional vertigo 09/08/2016   Sleep apnea 09/08/2016    Past Medical History:  Diagnosis Date   Acute cystitis    UTI   Anxiety    Arthritis    Benign positional vertigo    Dermatitis    Diabetes mellitus without complication (HCC)    Family history of adverse reaction to anesthesia    GERD (gastroesophageal reflux disease)    Goiter, nontoxic, multinodular    Gout    H/O: hysterectomy    History of tobacco use    Hx laparoscopic cholecystectomy    Hypercholesterolemia    Hypertension    Incomplete bladder emptying    Insomnia    Nausea & vomiting 09/08/2017   Postmenopausal atrophic vaginitis    RA (rheumatoid arthritis) (HCC)    Rectocele    Renal calculus    Skin cancer    Sleep apnea    Thyroid  condition    Type 2 diabetes mellitus (HCC)    Urolithiasis    Vaginal enterocele     Family History  Problem Relation Age of Onset   Diabetes Mother    Heart disease Father 16   Diabetes Son    Crohn's disease Son    Past Surgical History:  Procedure Laterality Date   AORTIC ARCH ANGIOGRAPHY N/A 10/22/2022   Procedure: AORTIC ARCH ANGIOGRAPHY;  Surgeon: Dann Candyce RAMAN, MD;  Location: [facility];  Service: Cardiovascular;  Laterality: N/A;   AORTIC ARCH ANGIOGRAPHY N/A 12/28/2023   Procedure: AORTIC ARCH ANGIOGRAPHY;  Surgeon: Darron Deatrice LABOR, MD;  Location: [facility];  Service: Cardiovascular;  Laterality: N/A;   CARPAL TUNNEL RELEASE  2018   CATARACT EXTRACTION, BILATERAL     CHOLECYSTECTOMY     COLONOSCOPY     07/11/2009, 02/2015   GALLBLADDER SURGERY     KNEE ARTHROSCOPY Right 05/20/2020   Procedure: RIGHT KNEE ARTHROSCOPY AND DEBRIDEMENT;  Surgeon: Harden Jerona GAILS, MD;  Location: [facility];  Service: Orthopedics;  Laterality: Right;   PARTIAL HYSTERECTOMY     [place] left intact   RIGHT/LEFT HEART CATH AND CORONARY ANGIOGRAPHY N/A 10/22/2022   Procedure: RIGHT/LEFT HEART CATH AND CORONARY ANGIOGRAPHY;  Surgeon: Dann Candyce RAMAN, MD;  Location: [facility];  Service: Cardiovascular;  Laterality: N/A;   SQUAMOUS CELL CARCINOMA EXCISION  05/2018   Social History   Tobacco Use   Smoking status: Former    Current packs/day: 0.00    Average packs/day: 0.2 packs/day for 10.0 years (2.0 ttl pk-yrs)    Types: Cigarettes    Start date: 07/05/1968    Quit date: 07/05/1978    Years since quitting: 45.7    Passive exposure: Past   Smokeless tobacco: Never  Vaping Use   Vaping status: Never Used  Substance Use Topics   Alcohol use: No   Drug use: No   Social History   Social  History Narrative   Diet: Blank      Do you drink/ eat things with caffeine? Yes      Marital status:  Married                             What year were you married ? 196      Do you live in a house, apartment,assistred living, condo, trailer, etc.)? House      Is it one or more stories? 1      How many persons live in your home ? 2      Do you have any pets in your home ?(please list)  1 Sam-Dog      Highest Level of education completed: 12      Current or past profession: Office      Do you exercise?   Some                           Type & how often  Walk Daily      ADVANCED DIRECTIVES (Please bring copies)      Do you have a living will? Blank      Do you have a DNR form?  Blank                     If not, do you want to discuss one?       Do you have signed POA?HPOA forms?  Yes               If so, please bring to your appointment      FUNCTIONAL STATUS- To be completed by Spouse / child / Staff       Do you have difficulty bathing or dressing yourself ?  No      Do you have difficulty preparing food or eating ?  No       Do you have difficulty managing your mediation ?  No      Do you have difficulty managing your finances ?  No      Do you have difficulty affording your medication ?  Yes (Some)        Immunization History  Administered Date(s) Administered   Fluad Quad(high Dose 65+) 04/09/2021   Fluad Trivalent(High Dose 65+) 04/14/2023   INFLUENZA, HIGH DOSE SEASONAL PF 02/08/2020, 05/04/2022   Influenza Split 03/28/2008, 03/05/2010, 04/17/2012, 04/20/2013   Influenza-Unspecified 05/11/2011, 04/22/2014, 04/15/2015, 02/20/2016, 03/02/2017, 03/09/2018, 03/20/2019, 03/21/2020   PFIZER(Purple Top)SARS-COV-2 Vaccination 07/24/2019, 08/06/2019, 08/27/2019, 04/21/2020   Pneumococcal Conjugate-13 05/02/2014   Pneumococcal Polysaccharide-23 04/23/2008   Td 05/01/2013   Tdap 05/01/2013   Zoster Recombinant(Shingrix) 04/17/2012     Objective: Vital Signs: BP 115/75 (BP Location: Left Arm)   Pulse 80   Temp (!) 97.5 F (36.4 C)   Resp 14   Ht 5' 1 (1.549 m)   Wt 158 lb 12.8 oz (72 kg)   BMI 30.00 kg/m    Physical Exam Vitals and nursing note reviewed.  Constitutional:      Appearance: She is well-developed.  HENT:     Head: Normocephalic and atraumatic.  Eyes:     Conjunctiva/sclera: Conjunctivae normal.  Cardiovascular:     Rate and Rhythm: Normal rate and regular rhythm.     Heart sounds: Normal heart sounds.     Comments: Decreased radial pulse noted. Pulmonary:     Effort: Pulmonary effort  is normal.     Breath sounds: Normal breath sounds.  Abdominal:     General: Bowel sounds are normal.     Palpations: Abdomen is soft.  Musculoskeletal:     Cervical back: Normal range of motion.  Lymphadenopathy:     Cervical: No cervical adenopathy.  Skin:    General: Skin is warm and dry.     Capillary Refill: Capillary refill takes less than 2 seconds.  Neurological:     Mental Status: She is alert and oriented to person, place, and time.  Psychiatric:        Behavior: Behavior normal.       Musculoskeletal Exam: Patient was examined in the seated position.  She had some discomfort range of motion of the cervical spine.  Thoracic kyphosis was noted.  No tenderness was noted over the thoracic or lumbar spine.    Shoulder joints, elbow joints, wrist joints, MCPs, PIPs and DIPs were in good range of motion with no synovitis.  Bilateral PIP and DIP thickening was noted.  Hip joints could not be assessed in the seated position.  Knee joints were in good range of motion without any warmth swelling or effusion.  There was no tenderness over ankles or MTPs.   CDAI Exam: CDAI Score: -- Patient Global: --; Provider Global: -- Swollen: --; Tender: -- Joint Exam 04/03/2024   No joint exam has been documented for this visit   There is currently no information documented on the homunculus. Go to the Rheumatology activity and complete the homunculus joint exam.  Investigation: No additional findings.  Imaging: No results found.  Recent Labs: Lab Results  Component Value Date   WBC 11.4 (H) 02/08/2024   HGB 12.5 02/08/2024   PLT 283 02/08/2024   NA 140 02/08/2024   K 4.1 02/08/2024   CL 99 02/08/2024   CO2 32 02/08/2024   GLUCOSE 86 02/08/2024   BUN 29 (H) 02/08/2024   CREATININE 1.12 (H) 02/08/2024   BILITOT 1.2 10/31/2023   ALKPHOS 83 07/25/2019   AST 11 10/31/2023   ALT 9 10/31/2023   PROT 6.9 10/31/2023   ALBUMIN 4.6 07/25/2019   CALCIUM  9.8 02/08/2024   GFRAA 43 (L) 01/09/2021   QFTBGOLDPLUS NEGATIVE 08/18/2023    Speciality Comments: Recieves Enbrel  through [employer] [facility] 02/20/2019 Osteopenia managed by Dr. Bernetta 02/20/2019  Procedures:  No procedures performed Allergies: Latex, Amaryl [glimepiride], Amlodipine , Codeine, Jardiance [empagliflozin], Pneumovax 23 [pneumococcal vac polyvalent], and Wellbutrin [bupropion]   Assessment / Plan:     Visit Diagnoses: Rheumatoid arthritis of multiple sites with negative rheumatoid factor  (HCC)-patient complains of discomfort in her left arm at times.  No synovitis was noted on the examination.  She has some PIP DIP and CMC thickening suggestive of osteoarthritis and rheumatoid arthritis overlap.  She has been on Enbrel  50 mg subcu weekly which has been working well.  No change in therapy advised.  Sed rate and CRP were normal in April 2025.  High risk medication use - Enbrel  50 mg sq injections once weekly (inadequate response to Arava ) February 08, 2024 CBC was normal.  Creatinine was normal at 1.12.  Primary osteoarthritis of both hands-has rheumatoid arthritis and osteoarthritis overlap.  Her rheumatoid arthritis is well-controlled.  Most of her symptoms are coming from osteoarthritis.  Trochanteric bursitis, right hip-she has intermittent pain.  IT band stretches were discussed.  Primary osteoarthritis of right knee -she continues to have knee joint discomfort.  She has severe osteoarthritis and  moderate chondromalacia patella was noted on the x-rays January 2022.  Primary osteoarthritis of both feet-she denies any discomfort in her feet today.  Neck pain - 08/05/22: multilevel severe degenerative disc disease and facet joint arthropathy.  She continues to have neck discomfort.  She was evaluated by neurology.  Degeneration of intervertebral disc of lumbar region without discogenic back pain or lower extremity pain -phonic pain.  Multilevel spondylosis and facet joint arthropathy was noted on the x-rays in 2018 done by Dr. Arloa.  Osteopenia of multiple sites-calcium  rich diet and vitamin D  was advised.  Vitamin D  deficiency  Type 2 diabetes mellitus   Subclavian artery stenosis, left-diagnosed by cardiology.  Calcified left subclavian artery was noted by cardiology.  Her sed rate was normal.  She has significant discrepancy in the left and right arm blood pressure and feeble radial pulse.  Aortic atherosclerosis  Essential hypertension  Gastroesophageal reflux disease  without esophagitis  History of hyperlipidemia  Nocturnal muscle cramps  History of anxiety  History of sleep apnea  History of vertigo  Former smoker  High risk medication use  Orders: No orders of the defined types were placed in this encounter.  No orders of the defined types were placed in this encounter.    Follow-Up Instructions: Return in about 5 months (around 09/01/2024) for Rheumatoid arthritis, Osteoarthritis.   Maya Nash, MD  Note - This record has been created using Animal nutritionist.  Chart creation errors have been sought, but may not always  have been located. Such creation errors do not reflect on  the standard of medical care.

## 2024-03-21 ENCOUNTER — Other Ambulatory Visit: Payer: Self-pay | Admitting: Orthopedic Surgery

## 2024-03-27 ENCOUNTER — Other Ambulatory Visit: Payer: Self-pay | Admitting: Internal Medicine

## 2024-03-27 ENCOUNTER — Other Ambulatory Visit: Payer: Self-pay | Admitting: Orthopedic Surgery

## 2024-03-27 DIAGNOSIS — G4709 Other insomnia: Secondary | ICD-10-CM

## 2024-03-27 DIAGNOSIS — F419 Anxiety disorder, unspecified: Secondary | ICD-10-CM

## 2024-03-27 NOTE — Telephone Encounter (Signed)
 Patient is requesting a refill of the following medications: Requested Prescriptions   Pending Prescriptions Disp Refills   LORazepam  (ATIVAN ) 0.5 MG tablet [Pharmacy Med Name: LORazepam  0.5 MG TABLET] 30 tablet     Sig: TAKE 1 TABLET BY MOUTH AT BEDTIME    Date of last refill: 02/14/24  Refill amount: 30  Treatment agreement date: Treatment agreement on file is outdated, notation made on pending appointment for 05/24/24 to update

## 2024-04-03 ENCOUNTER — Encounter: Payer: Self-pay | Admitting: Rheumatology

## 2024-04-03 ENCOUNTER — Ambulatory Visit: Attending: Rheumatology | Admitting: Rheumatology

## 2024-04-03 VITALS — BP 115/75 | HR 80 | Temp 97.5°F | Resp 14 | Ht 61.0 in | Wt 158.8 lb

## 2024-04-03 DIAGNOSIS — R252 Cramp and spasm: Secondary | ICD-10-CM

## 2024-04-03 DIAGNOSIS — I700 Atherosclerosis of aorta: Secondary | ICD-10-CM

## 2024-04-03 DIAGNOSIS — I10 Essential (primary) hypertension: Secondary | ICD-10-CM

## 2024-04-03 DIAGNOSIS — M1711 Unilateral primary osteoarthritis, right knee: Secondary | ICD-10-CM | POA: Diagnosis not present

## 2024-04-03 DIAGNOSIS — E118 Type 2 diabetes mellitus with unspecified complications: Secondary | ICD-10-CM

## 2024-04-03 DIAGNOSIS — M51369 Other intervertebral disc degeneration, lumbar region without mention of lumbar back pain or lower extremity pain: Secondary | ICD-10-CM | POA: Diagnosis not present

## 2024-04-03 DIAGNOSIS — E559 Vitamin D deficiency, unspecified: Secondary | ICD-10-CM | POA: Diagnosis not present

## 2024-04-03 DIAGNOSIS — Z79899 Other long term (current) drug therapy: Secondary | ICD-10-CM | POA: Diagnosis not present

## 2024-04-03 DIAGNOSIS — M19041 Primary osteoarthritis, right hand: Secondary | ICD-10-CM

## 2024-04-03 DIAGNOSIS — M7061 Trochanteric bursitis, right hip: Secondary | ICD-10-CM | POA: Diagnosis not present

## 2024-04-03 DIAGNOSIS — Z87898 Personal history of other specified conditions: Secondary | ICD-10-CM

## 2024-04-03 DIAGNOSIS — I771 Stricture of artery: Secondary | ICD-10-CM

## 2024-04-03 DIAGNOSIS — Z87891 Personal history of nicotine dependence: Secondary | ICD-10-CM

## 2024-04-03 DIAGNOSIS — Z8639 Personal history of other endocrine, nutritional and metabolic disease: Secondary | ICD-10-CM

## 2024-04-03 DIAGNOSIS — M0609 Rheumatoid arthritis without rheumatoid factor, multiple sites: Secondary | ICD-10-CM | POA: Diagnosis not present

## 2024-04-03 DIAGNOSIS — M542 Cervicalgia: Secondary | ICD-10-CM | POA: Diagnosis not present

## 2024-04-03 DIAGNOSIS — M19071 Primary osteoarthritis, right ankle and foot: Secondary | ICD-10-CM | POA: Diagnosis not present

## 2024-04-03 DIAGNOSIS — Z8659 Personal history of other mental and behavioral disorders: Secondary | ICD-10-CM

## 2024-04-03 DIAGNOSIS — M19072 Primary osteoarthritis, left ankle and foot: Secondary | ICD-10-CM

## 2024-04-03 DIAGNOSIS — K219 Gastro-esophageal reflux disease without esophagitis: Secondary | ICD-10-CM

## 2024-04-03 DIAGNOSIS — M8589 Other specified disorders of bone density and structure, multiple sites: Secondary | ICD-10-CM

## 2024-04-03 DIAGNOSIS — M19042 Primary osteoarthritis, left hand: Secondary | ICD-10-CM

## 2024-04-03 DIAGNOSIS — Z8669 Personal history of other diseases of the nervous system and sense organs: Secondary | ICD-10-CM

## 2024-04-03 DIAGNOSIS — Z794 Long term (current) use of insulin: Secondary | ICD-10-CM

## 2024-04-03 NOTE — Patient Instructions (Addendum)
 Standing Labs We placed an order today for your standing lab work.   Please have your standing labs drawn in November and every 3 months  Please have your labs drawn 2 weeks prior to your appointment so that the provider can discuss your lab results at your appointment, if possible.  Please note that you may see your imaging and lab results in MyChart before we have reviewed them. We will contact you once all results are reviewed. Please allow our office up to 72 hours to thoroughly review all of the results before contacting the office for clarification of your results.  WALK-IN LAB HOURS  Monday through Thursday from 8:00 am -12:30 pm and 1:00 pm-4:30 pm and Friday from 8:00 am-12:00 pm.  Patients with office visits requiring labs will be seen before walk-in labs.  You may encounter longer than normal wait times. Please allow additional time. Wait times may be shorter on  Monday and Thursday afternoons.  We do not book appointments for walk-in labs. We appreciate your patience and understanding with our staff.   Labs are drawn by [facility]. Please bring your co-pay at the time of your lab draw.  You may receive a bill from [facility] for your lab work.  Please note if you are on Hydroxychloroquine  and and an order has been placed for a Hydroxychloroquine  level,  you will need to have it drawn 4 hours or more after your last dose.  If you wish to have your labs drawn at another location, please call the office 24 hours in advance so we can fax the orders.  The office is located at [address], [place], [place] [address]   If you have any questions regarding directions or hours of operation,  please call [phone].   As a reminder, please drink plenty of water prior to coming for your lab work. Thanks!   Vaccines You are taking a medication(s) that can suppress your immune system.  The following immunizations are recommended: Flu annually Covid-19  RSV Td/Tdap (tetanus,  diphtheria, pertussis) every 10 years Pneumonia (Prevnar 15 then Pneumovax 23 at least 1 year apart.  Alternatively, can take Prevnar 20 without needing additional dose) Shingrix: 2 doses from 4 weeks to 6 months apart  Please check with your PCP to make sure you are up to date.  If you have signs or symptoms of an infection or start antibiotics: First, call your PCP for workup of your infection. Hold your medication through the infection, until you complete your antibiotics, and until symptoms resolve if you take the following: Injectable medication (Actemra, Benlysta, Cimzia, Cosentyx, Enbrel , Humira, Kevzara, Orencia, Remicade, Simponi, Stelara, Taltz, Tremfya) Methotrexate  Leflunomide  (Arava ) Mycophenolate (Cellcept) Xeljanz, Olumiant, or Rinvoq   Get an annual skin examination to screen for skin cancer.  Please use sunscreen and sun protection.

## 2024-04-09 ENCOUNTER — Encounter: Payer: Self-pay | Admitting: Surgery

## 2024-04-09 ENCOUNTER — Ambulatory Visit: Attending: Surgery | Admitting: Surgery

## 2024-04-09 ENCOUNTER — Telehealth: Payer: Self-pay

## 2024-04-09 ENCOUNTER — Ambulatory Visit: Admitting: Surgery

## 2024-04-09 VITALS — BP 133/70 | HR 81 | Temp 97.9°F | Ht 61.0 in | Wt 159.0 lb

## 2024-04-09 DIAGNOSIS — I708 Atherosclerosis of other arteries: Secondary | ICD-10-CM | POA: Diagnosis not present

## 2024-04-09 NOTE — H&P (View-Only) (Signed)
 [facility]  Patient name: Annette Suarez MRN: [id number] DOB: December 13, 1940 Sex: female   REASON FOR VISIT:    Follow up  HISOTRY OF PRESENT ILLNESS:    Annette Suarez is a 83 y.o. female, who is referred for left subclavian artery occlusion. She was originally seen by Dr. Darron in 2022 for an asymptomatic subclavian stenosis.  No intervention was performed.  More recently she has developed symptoms including dizziness, especially when changing positions from laying down to standing up.  She will also get some numbness in her left fingers.  She also describes her gait at not being very steady and she will wobble back-and-forth.  I was concerned that her symptoms were not related to her left subclavian occlusion and so I sent her to neurology for further evaluation which has been done.  They do feel that her symptoms are secondary to left subclavian artery occlusion     The patient suffers from nonobstructive coronary artery disease.  She is medically managed for hypertension and diabetes.  She takes a statin for hypercholesterolemia.  She is a former smoker.  PAST MEDICAL HISTORY:   Past Medical History:  Diagnosis Date   Acute cystitis    UTI   Anxiety    Arthritis    Benign positional vertigo    Dermatitis    Diabetes mellitus without complication (HCC)    Family history of adverse reaction to anesthesia    GERD (gastroesophageal reflux disease)    Goiter, nontoxic, multinodular    Gout    H/O: hysterectomy    History of tobacco use    Hx laparoscopic cholecystectomy    Hypercholesterolemia    Hypertension    Incomplete bladder emptying    Insomnia    Nausea & vomiting 09/08/2017   Postmenopausal atrophic vaginitis    RA (rheumatoid arthritis) (HCC)    Rectocele    Renal calculus    Skin cancer    Sleep apnea    Thyroid  condition    Type 2 diabetes mellitus (HCC)    Urolithiasis    Vaginal enterocele       FAMILY HISTORY:   Family History  Problem Relation Age of Onset   Diabetes Mother    Heart disease Father 27   Diabetes Son    Crohn's disease Son     SOCIAL HISTORY:   Social History   Tobacco Use   Smoking status: Former    Current packs/day: 0.00    Average packs/day: 0.2 packs/day for 10.0 years (2.0 ttl pk-yrs)    Types: Cigarettes    Start date: 07/05/1968    Quit date: 07/05/1978    Years since quitting: 45.7    Passive exposure: Past   Smokeless tobacco: Never  Substance Use Topics   Alcohol use: No     ALLERGIES:   Allergies  Allergen Reactions   Latex Rash   Amaryl [Glimepiride]     Don't Remember   Amlodipine      LE edema on 7.5mg  daily. Tolerates 5mg  daily just fine   Codeine     SICK   Jardiance [Empagliflozin]     Urinary tract infection- SIDE EFFECT   Pneumovax 23 [Pneumococcal Vac Polyvalent]     Red / Swelling / Hot to touch SIDE EFFECTS   Wellbutrin [Bupropion]     Unknown     CURRENT MEDICATIONS:   Current Outpatient Medications  Medication Sig Dispense Refill   Accu-Chek Softclix Lancets lancets Use to  test blood sugar three times daily. Dx: E11.8 300 each 1   aspirin  EC 81 MG tablet Take 81 mg by mouth daily.     Biotin 1 MG CAPS Take 1 mg by mouth daily.     carvedilol  (COREG ) 12.5 MG tablet Take 1 tablet (12.5 mg total) by mouth 2 (two) times daily. 180 tablet 3   cetirizine  (ZYRTEC ) 10 MG tablet Take 1 tablet (10 mg total) by mouth daily. 30 tablet 11   chlorthalidone  (HYGROTON ) 25 MG tablet TAKE 1 TABLET BY MOUTH DAILY 90 tablet 3   Cholecalciferol (VITAMIN D3) 50 MCG (2000 UT) capsule Take 2,000 Units by mouth daily.     clobetasol cream (TEMOVATE) 0.05 % Apply 1 Application topically daily as needed (Rash).     Continuous Glucose Receiver (FREESTYLE LIBRE 3 READER) DEVI 1 Device by Does not apply route daily in the afternoon. 1 each 0   Continuous Glucose Sensor (FREESTYLE LIBRE 3 PLUS SENSOR) MISC 1 Device by Other route  every 14 (fourteen) days. Change sensor every 15 days. 6 each 3   Diclofenac Sodium (VOLTAREN EX) Apply 1 application  topically 3 (three) times daily as needed (Arthritis).     estradiol  (ESTRACE ) 0.1 MG/GM vaginal cream Place 1 Applicatorful vaginally 3 (three) times a week. 42.5 g 0   etanercept  (ENBREL  SURECLICK) 50 MG/ML injection Inject 50 mg into the skin once a week. 12 mL 0   fluocinonide  (LIDEX ) 0.05 % external solution Apply 1 Application topically daily as needed. 60 mL 3   insulin  degludec (TRESIBA  FLEXTOUCH) 200 UNIT/ML FlexTouch Pen INJECT UNDER THE SKIN 60 UNITS DAILY IN THE AFTERNOON 9 mL 1   insulin  lispro (HUMALOG  KWIKPEN) 100 UNIT/ML KwikPen Start CF: Humalog  (BG-130/35) TIDQAC and at bedtime  Max daily 30 units 30 mL 3   Insulin  Pen Needle (DROPLET PEN NEEDLES) 31G X 5 MM MISC USE TO ADMINISTER INSULIN  4 TIMES DAILY 100 each 2   irbesartan  (AVAPRO ) 300 MG tablet TAKE 1 TABLET BY MOUTH DAILY 90 tablet 3   loperamide (IMODIUM A-D) 2 MG tablet Take 2 mg by mouth daily as needed for diarrhea or loose stools.     LORazepam  (ATIVAN ) 0.5 MG tablet TAKE 1 TABLET BY MOUTH AT BEDTIME 30 tablet 1   MAGNESIUM CITRATE PO Take 250 mg by mouth daily.     nitroGLYCERIN  (NITROSTAT ) 0.4 MG SL tablet Place 1 tablet (0.4 mg total) under the tongue every 5 (five) minutes as needed for chest pain. 25 tablet 3   Omeprazole 20 MG TBDD Take 20 mg by mouth daily.     ondansetron  (ZOFRAN ) 4 MG tablet TAKE 1 TABLET BY MOUTH 3 TIMES A DAY AS NEEDED FOR NAUSEA AND/OR VOMITING 20 tablet 0   Probiotic Product (PROBIOTIC PO) Take 1 tablet by mouth daily.     Propylene Glycol (SYSTANE COMPLETE OP) Place 1-2 drops into both eyes daily as needed (Dry eye).     Semaglutide ,0.25 or 0.5MG /DOS, (OZEMPIC , 0.25 OR 0.5 MG/DOSE,) 2 MG/1.5ML SOPN Inject 0.5 mg into the skin once a week. 3 mL 0   simvastatin  (ZOCOR ) 20 MG tablet TAKE 1 TABLET BY MOUTH EVERY EVENING 90 tablet 1   No current facility-administered  medications for this visit.    REVIEW OF SYSTEMS:   [X]  denotes positive finding, [ ]  denotes negative finding Cardiac  Comments:  Chest pain or chest pressure:    Shortness of breath upon exertion:    Short of breath when lying flat:  Irregular heart rhythm:        Vascular    Pain in calf, thigh, or hip brought on by ambulation:    Pain in feet at night that wakes you up from your sleep:     Blood clot in your veins:    Leg swelling:         Pulmonary    Oxygen at home:    Productive cough:     Wheezing:         Neurologic    Sudden weakness in arms or legs:     Sudden numbness in arms or legs:     Sudden onset of difficulty speaking or slurred speech:    Temporary loss of vision in one eye:     Problems with dizziness:         Gastrointestinal    Blood in stool:     Vomited blood:         Genitourinary    Burning when urinating:     Blood in urine:        Psychiatric    Major depression:         Hematologic    Bleeding problems:    Problems with blood clotting too easily:        Skin    Rashes or ulcers:        Constitutional    Fever or chills:      PHYSICAL EXAM:   Vitals:   04/09/24 1508 04/09/24 1511  BP: (!) 177/73 133/70  Pulse: 81   Temp: 97.9 F (36.6 C)   SpO2: 97%   Weight: 159 lb (72.1 kg)   Height: 5' 1 (1.549 m)     GENERAL: The patient is a well-nourished female, in no acute distress. The vital signs are documented above. CARDIAC: There is a regular rate and rhythm.  PULMONARY: Non-labored respirations ABDOMEN: Soft and non-tender with normal pitched bowel sounds.  MUSCULOSKELETAL: There are no major deformities or cyanosis. NEUROLOGIC: No focal weakness or paresthesias are detected. SKIN: There are no ulcers or rashes noted. PSYCHIATRIC: The patient has a normal affect.  STUDIES:    MEDICAL ISSUES:   Left subclavian stenosis: The patient does exhibit symptoms of dizziness and some left arm numbness.  Neurology has  evaluated the patient and feel that this is a likely explanation for her symptoms.  Therefore, I discussed proceeding with a left carotid subclavian artery bypass graft I discussed the details of the operation including the risk of nerve injury, stroke and infection/lymphatic leak.  She would like to wait until her son is back in town and get this done before the end of November.  I have asked her to hold her Enbrel  for 1 week prior to surgery.  She will also need to hold her Ozempic .    Malvina Serene CLORE, MD, FACS [facility] [phone] Pager [phone]

## 2024-04-09 NOTE — Telephone Encounter (Signed)
 Patient and husband would like to speak to their son re: surgery with Dr. Serene prior to scheduling.  They will call when ready.

## 2024-04-09 NOTE — Progress Notes (Signed)
 [facility]  Patient name: Annette Suarez MRN: [id number] DOB: December 13, 1940 Sex: female   REASON FOR VISIT:    Follow up  HISOTRY OF PRESENT ILLNESS:    Annette Suarez is a 83 y.o. female, who is referred for left subclavian artery occlusion. She was originally seen by Dr. Darron in 2022 for an asymptomatic subclavian stenosis.  No intervention was performed.  More recently she has developed symptoms including dizziness, especially when changing positions from laying down to standing up.  She will also get some numbness in her left fingers.  She also describes her gait at not being very steady and she will wobble back-and-forth.  I was concerned that her symptoms were not related to her left subclavian occlusion and so I sent her to neurology for further evaluation which has been done.  They do feel that her symptoms are secondary to left subclavian artery occlusion     The patient suffers from nonobstructive coronary artery disease.  She is medically managed for hypertension and diabetes.  She takes a statin for hypercholesterolemia.  She is a former smoker.  PAST MEDICAL HISTORY:   Past Medical History:  Diagnosis Date   Acute cystitis    UTI   Anxiety    Arthritis    Benign positional vertigo    Dermatitis    Diabetes mellitus without complication (HCC)    Family history of adverse reaction to anesthesia    GERD (gastroesophageal reflux disease)    Goiter, nontoxic, multinodular    Gout    H/O: hysterectomy    History of tobacco use    Hx laparoscopic cholecystectomy    Hypercholesterolemia    Hypertension    Incomplete bladder emptying    Insomnia    Nausea & vomiting 09/08/2017   Postmenopausal atrophic vaginitis    RA (rheumatoid arthritis) (HCC)    Rectocele    Renal calculus    Skin cancer    Sleep apnea    Thyroid  condition    Type 2 diabetes mellitus (HCC)    Urolithiasis    Vaginal enterocele       FAMILY HISTORY:   Family History  Problem Relation Age of Onset   Diabetes Mother    Heart disease Father 27   Diabetes Son    Crohn's disease Son     SOCIAL HISTORY:   Social History   Tobacco Use   Smoking status: Former    Current packs/day: 0.00    Average packs/day: 0.2 packs/day for 10.0 years (2.0 ttl pk-yrs)    Types: Cigarettes    Start date: 07/05/1968    Quit date: 07/05/1978    Years since quitting: 45.7    Passive exposure: Past   Smokeless tobacco: Never  Substance Use Topics   Alcohol use: No     ALLERGIES:   Allergies  Allergen Reactions   Latex Rash   Amaryl [Glimepiride]     Don't Remember   Amlodipine      LE edema on 7.5mg  daily. Tolerates 5mg  daily just fine   Codeine     SICK   Jardiance [Empagliflozin]     Urinary tract infection- SIDE EFFECT   Pneumovax 23 [Pneumococcal Vac Polyvalent]     Red / Swelling / Hot to touch SIDE EFFECTS   Wellbutrin [Bupropion]     Unknown     CURRENT MEDICATIONS:   Current Outpatient Medications  Medication Sig Dispense Refill   Accu-Chek Softclix Lancets lancets Use to  test blood sugar three times daily. Dx: E11.8 300 each 1   aspirin  EC 81 MG tablet Take 81 mg by mouth daily.     Biotin 1 MG CAPS Take 1 mg by mouth daily.     carvedilol  (COREG ) 12.5 MG tablet Take 1 tablet (12.5 mg total) by mouth 2 (two) times daily. 180 tablet 3   cetirizine  (ZYRTEC ) 10 MG tablet Take 1 tablet (10 mg total) by mouth daily. 30 tablet 11   chlorthalidone  (HYGROTON ) 25 MG tablet TAKE 1 TABLET BY MOUTH DAILY 90 tablet 3   Cholecalciferol (VITAMIN D3) 50 MCG (2000 UT) capsule Take 2,000 Units by mouth daily.     clobetasol cream (TEMOVATE) 0.05 % Apply 1 Application topically daily as needed (Rash).     Continuous Glucose Receiver (FREESTYLE LIBRE 3 READER) DEVI 1 Device by Does not apply route daily in the afternoon. 1 each 0   Continuous Glucose Sensor (FREESTYLE LIBRE 3 PLUS SENSOR) MISC 1 Device by Other route  every 14 (fourteen) days. Change sensor every 15 days. 6 each 3   Diclofenac Sodium (VOLTAREN EX) Apply 1 application  topically 3 (three) times daily as needed (Arthritis).     estradiol  (ESTRACE ) 0.1 MG/GM vaginal cream Place 1 Applicatorful vaginally 3 (three) times a week. 42.5 g 0   etanercept  (ENBREL  SURECLICK) 50 MG/ML injection Inject 50 mg into the skin once a week. 12 mL 0   fluocinonide  (LIDEX ) 0.05 % external solution Apply 1 Application topically daily as needed. 60 mL 3   insulin  degludec (TRESIBA  FLEXTOUCH) 200 UNIT/ML FlexTouch Pen INJECT UNDER THE SKIN 60 UNITS DAILY IN THE AFTERNOON 9 mL 1   insulin  lispro (HUMALOG  KWIKPEN) 100 UNIT/ML KwikPen Start CF: Humalog  (BG-130/35) TIDQAC and at bedtime  Max daily 30 units 30 mL 3   Insulin  Pen Needle (DROPLET PEN NEEDLES) 31G X 5 MM MISC USE TO ADMINISTER INSULIN  4 TIMES DAILY 100 each 2   irbesartan  (AVAPRO ) 300 MG tablet TAKE 1 TABLET BY MOUTH DAILY 90 tablet 3   loperamide (IMODIUM A-D) 2 MG tablet Take 2 mg by mouth daily as needed for diarrhea or loose stools.     LORazepam  (ATIVAN ) 0.5 MG tablet TAKE 1 TABLET BY MOUTH AT BEDTIME 30 tablet 1   MAGNESIUM CITRATE PO Take 250 mg by mouth daily.     nitroGLYCERIN  (NITROSTAT ) 0.4 MG SL tablet Place 1 tablet (0.4 mg total) under the tongue every 5 (five) minutes as needed for chest pain. 25 tablet 3   Omeprazole 20 MG TBDD Take 20 mg by mouth daily.     ondansetron  (ZOFRAN ) 4 MG tablet TAKE 1 TABLET BY MOUTH 3 TIMES A DAY AS NEEDED FOR NAUSEA AND/OR VOMITING 20 tablet 0   Probiotic Product (PROBIOTIC PO) Take 1 tablet by mouth daily.     Propylene Glycol (SYSTANE COMPLETE OP) Place 1-2 drops into both eyes daily as needed (Dry eye).     Semaglutide ,0.25 or 0.5MG /DOS, (OZEMPIC , 0.25 OR 0.5 MG/DOSE,) 2 MG/1.5ML SOPN Inject 0.5 mg into the skin once a week. 3 mL 0   simvastatin  (ZOCOR ) 20 MG tablet TAKE 1 TABLET BY MOUTH EVERY EVENING 90 tablet 1   No current facility-administered  medications for this visit.    REVIEW OF SYSTEMS:   [X]  denotes positive finding, [ ]  denotes negative finding Cardiac  Comments:  Chest pain or chest pressure:    Shortness of breath upon exertion:    Short of breath when lying flat:  Irregular heart rhythm:        Vascular    Pain in calf, thigh, or hip brought on by ambulation:    Pain in feet at night that wakes you up from your sleep:     Blood clot in your veins:    Leg swelling:         Pulmonary    Oxygen at home:    Productive cough:     Wheezing:         Neurologic    Sudden weakness in arms or legs:     Sudden numbness in arms or legs:     Sudden onset of difficulty speaking or slurred speech:    Temporary loss of vision in one eye:     Problems with dizziness:         Gastrointestinal    Blood in stool:     Vomited blood:         Genitourinary    Burning when urinating:     Blood in urine:        Psychiatric    Major depression:         Hematologic    Bleeding problems:    Problems with blood clotting too easily:        Skin    Rashes or ulcers:        Constitutional    Fever or chills:      PHYSICAL EXAM:   Vitals:   04/09/24 1508 04/09/24 1511  BP: (!) 177/73 133/70  Pulse: 81   Temp: 97.9 F (36.6 C)   SpO2: 97%   Weight: 159 lb (72.1 kg)   Height: 5' 1 (1.549 m)     GENERAL: The patient is a well-nourished female, in no acute distress. The vital signs are documented above. CARDIAC: There is a regular rate and rhythm.  PULMONARY: Non-labored respirations ABDOMEN: Soft and non-tender with normal pitched bowel sounds.  MUSCULOSKELETAL: There are no major deformities or cyanosis. NEUROLOGIC: No focal weakness or paresthesias are detected. SKIN: There are no ulcers or rashes noted. PSYCHIATRIC: The patient has a normal affect.  STUDIES:    MEDICAL ISSUES:   Left subclavian stenosis: The patient does exhibit symptoms of dizziness and some left arm numbness.  Neurology has  evaluated the patient and feel that this is a likely explanation for her symptoms.  Therefore, I discussed proceeding with a left carotid subclavian artery bypass graft I discussed the details of the operation including the risk of nerve injury, stroke and infection/lymphatic leak.  She would like to wait until her son is back in town and get this done before the end of November.  I have asked her to hold her Enbrel  for 1 week prior to surgery.  She will also need to hold her Ozempic .    Malvina Serene CLORE, MD, FACS [facility] [phone] Pager [phone]

## 2024-04-11 ENCOUNTER — Other Ambulatory Visit: Payer: Self-pay

## 2024-04-11 DIAGNOSIS — I708 Atherosclerosis of other arteries: Secondary | ICD-10-CM

## 2024-04-11 DIAGNOSIS — Z79899 Other long term (current) drug therapy: Secondary | ICD-10-CM

## 2024-04-11 DIAGNOSIS — M0609 Rheumatoid arthritis without rheumatoid factor, multiple sites: Secondary | ICD-10-CM

## 2024-04-11 MED ORDER — ENBREL SURECLICK 50 MG/ML ~~LOC~~ SOAJ
50.0000 mg | SUBCUTANEOUS | 0 refills | Status: AC
Start: 2024-04-11 — End: ?

## 2024-04-11 NOTE — Telephone Encounter (Signed)
 Refill request received via fax from [facility] for Enbrel   Last Fill: 01/12/2024  Labs: 02/08/2024 WBC 11.4 BUN 29 Creat 1.12 eGFR 50 BUN/Creatinine Ratio 26  TB Gold: 08/18/2023 TB Gold is negative.  Next Visit: 09/05/2024  Last Visit: 04/03/2024  DX: Rheumatoid arthritis of multiple sites with negative rheumatoid factor (HCC)   Current Dose per office note 04/03/2024: Enbrel  50 mg sq injections once weekly   Okay to refill Enbrel ?

## 2024-04-19 ENCOUNTER — Encounter (HOSPITAL_COMMUNITY): Payer: Self-pay

## 2024-04-19 DIAGNOSIS — R3914 Feeling of incomplete bladder emptying: Secondary | ICD-10-CM | POA: Diagnosis not present

## 2024-04-19 DIAGNOSIS — N3020 Other chronic cystitis without hematuria: Secondary | ICD-10-CM | POA: Diagnosis not present

## 2024-04-19 NOTE — Pre-Procedure Instructions (Signed)
 Surgical Instructions   Your procedure is scheduled on April 25, 2024. Report to [facility] Main Entrance A at 5:30 A.M., then check in with the Admitting office. Any questions or running late day of surgery: call [phone]  Questions prior to your surgery date: call [phone], Monday-Friday, 8am-4pm. If you experience any cold or flu symptoms such as cough, fever, chills, shortness of breath, etc. between now and your scheduled surgery, please notify us  at the above number.     Remember:  Do not eat or drink after midnight the night before your surgery    Take these medicines the morning of surgery with A SIP OF WATER: aspirin   carvedilol  (COREG )  methenamine (HIPREX)  Omeprazole    May take these medicines IF NEEDED: diazepam  (VALIUM )  nitroGLYCERIN  (NITROSTAT ) - if dose taken prior to surgery, please call [phone] ondansetron  (ZOFRAN )  Polyethylene Glycol 400 (BLINK TEARS) eye drops   Please contact your prescribing physicians office on if/when to stop taking your etanercept  (ENBREL  SURECLICK) injections.   One week prior to surgery, STOP taking any Aleve, Naproxen, Ibuprofen, Motrin, Advil, Goody's, BC's, all herbal medications, fish oil, and non-prescription vitamins. This includes your medication: Diclofenac Sodium (VOLTAREN EX)    WHAT DO I DO ABOUT MY DIABETES MEDICATION?   THE NIGHT BEFORE SURGERY, take 30 units of insulin  degludec (TRESIBA  FLEXTOUCH).      STOP taking your Semaglutide  (OZEMPIC ) one week prior to surgery. DO NOT take any doses after October 14th.  DO NOT take your insulin  lispro (HUMALOG  KWIKPEN) at bedtime the night before surgery.   Morning of surgery, if your CBG is greater than 220 mg/dL, you may take  of your sliding scale insulin  lispro (HUMALOG  KWIKPEN).   HOW TO MANAGE YOUR DIABETES BEFORE AND AFTER SURGERY  Why is it important to control my blood sugar before and after surgery? Improving blood sugar levels before and  after surgery helps healing and can limit problems. A way of improving blood sugar control is eating a healthy diet by:  Eating less sugar and carbohydrates  Increasing activity/exercise  Talking with your doctor about reaching your blood sugar goals High blood sugars (greater than 180 mg/dL) can raise your risk of infections and slow your recovery, so you will need to focus on controlling your diabetes during the weeks before surgery. Make sure that the doctor who takes care of your diabetes knows about your planned surgery including the date and location.  How do I manage my blood sugar before surgery? Check your blood sugar at least 4 times a day, starting 2 days before surgery, to make sure that the level is not too high or low.  Check your blood sugar the morning of your surgery when you wake up and every 2 hours until you get to the Short Stay unit.  If your blood sugar is less than 70 mg/dL, you will need to treat for low blood sugar: Do not take insulin . Treat a low blood sugar (less than 70 mg/dL) with  cup of clear juice (cranberry or apple), 4 glucose tablets, OR glucose gel. Recheck blood sugar in 15 minutes after treatment (to make sure it is greater than 70 mg/dL). If your blood sugar is not greater than 70 mg/dL on recheck, call [phone] for further instructions. Report your blood sugar to the short stay nurse when you get to Short Stay.  If you are admitted to the hospital after surgery: Your blood sugar will be checked by the staff and  you will probably be given insulin  after surgery (instead of oral diabetes medicines) to make sure you have good blood sugar levels. The goal for blood sugar control after surgery is 80-180 mg/dL.                      Do NOT Smoke (Tobacco/Vaping) for 24 hours prior to your procedure.  If you use a CPAP at night, you may bring your mask/headgear for your overnight stay.   You will be asked to remove any contacts, glasses, piercing's,  hearing aid's, dentures/partials prior to surgery. Please bring cases for these items if needed.    Patients discharged the day of surgery will not be allowed to drive home, and someone needs to stay with them for 24 hours.  SURGICAL WAITING ROOM VISITATION Patients may have no more than 2 support people in the waiting area - these visitors may rotate.   Pre-op nurse will coordinate an appropriate time for 1 ADULT support person, who may not rotate, to accompany patient in pre-op.  Children under the age of 84 must have an adult with them who is not the patient and must remain in the main waiting area with an adult.  If the patient needs to stay at the hospital during part of their recovery, the visitor guidelines for inpatient rooms apply.  Please refer to the [facility] website for the visitor guidelines for any additional information.   If you received a COVID test during your pre-op visit  it is requested that you wear a mask when out in public, stay away from anyone that may not be feeling well and notify your surgeon if you develop symptoms. If you have been in contact with anyone that has tested positive in the last 10 days please notify you surgeon.      Pre-operative CHG Bathing Instructions   You can play a key role in reducing the risk of infection after surgery. Your skin needs to be as free of germs as possible. You can reduce the number of germs on your skin by washing with CHG (chlorhexidine gluconate) soap before surgery. CHG is an antiseptic soap that kills germs and continues to kill germs even after washing.   DO NOT use if you have an allergy to chlorhexidine/CHG or antibacterial soaps. If your skin becomes reddened or irritated, stop using the CHG and notify one of our RNs at [phone].              TAKE A SHOWER THE NIGHT BEFORE SURGERY   Please keep in mind the following:  DO NOT shave, including legs and underarms, 48 hours prior to surgery.   You may shave  your face before/day of surgery.  Place clean sheets on your bed the night before surgery Use a clean washcloth (not used since being washed) for shower. DO NOT sleep with pet's night before surgery.  CHG Shower Instructions:  Wash your face and private area with normal soap. If you choose to wash your hair, wash first with your normal shampoo.  After you use shampoo/soap, rinse your hair and body thoroughly to remove shampoo/soap residue.  Turn the water OFF and apply half the bottle of CHG soap to a CLEAN washcloth.  Apply CHG soap ONLY FROM YOUR NECK DOWN TO YOUR TOES (washing for 3-5 minutes)  DO NOT use CHG soap on face, private areas, open wounds, or sores.  Pay special attention to the area where your surgery is being  performed.  If you are having back surgery, having someone wash your back for you may be helpful. Wait 2 minutes after CHG soap is applied, then you may rinse off the CHG soap.  Pat dry with a clean towel  Put on clean pajamas    Additional instructions for the day of surgery: If you choose, you may shower the morning of surgery with an antibacterial soap.  DO NOT APPLY any lotions, deodorants, cologne, or perfumes.   Do not wear jewelry or makeup Do not wear nail polish, gel polish, artificial nails, or any other type of covering on natural nails (fingers and toes) Do not bring valuables to the hospital. [facility] is not responsible for valuables/personal belongings. Put on clean/comfortable clothes.  Please brush your teeth.  Ask your nurse before applying any prescription medications to the skin.

## 2024-04-20 ENCOUNTER — Telehealth: Payer: Self-pay

## 2024-04-20 ENCOUNTER — Encounter (HOSPITAL_COMMUNITY): Payer: Self-pay

## 2024-04-20 ENCOUNTER — Telehealth: Payer: Self-pay | Admitting: Internal Medicine

## 2024-04-20 ENCOUNTER — Encounter (HOSPITAL_COMMUNITY)
Admission: RE | Admit: 2024-04-20 | Discharge: 2024-04-20 | Disposition: A | Discharge status: Home / Self Care | Source: Ambulatory Visit | Attending: Surgery | Admitting: Surgery

## 2024-04-20 ENCOUNTER — Other Ambulatory Visit: Payer: Self-pay

## 2024-04-20 VITALS — BP 163/71 | HR 78 | Temp 98.2°F | Resp 16

## 2024-04-20 DIAGNOSIS — K219 Gastro-esophageal reflux disease without esophagitis: Secondary | ICD-10-CM | POA: Insufficient documentation

## 2024-04-20 DIAGNOSIS — Z794 Long term (current) use of insulin: Secondary | ICD-10-CM | POA: Diagnosis not present

## 2024-04-20 DIAGNOSIS — I10 Essential (primary) hypertension: Secondary | ICD-10-CM | POA: Insufficient documentation

## 2024-04-20 DIAGNOSIS — I491 Atrial premature depolarization: Secondary | ICD-10-CM | POA: Diagnosis not present

## 2024-04-20 DIAGNOSIS — Z01818 Encounter for other preprocedural examination: Secondary | ICD-10-CM

## 2024-04-20 DIAGNOSIS — I2510 Atherosclerotic heart disease of native coronary artery without angina pectoris: Secondary | ICD-10-CM | POA: Insufficient documentation

## 2024-04-20 DIAGNOSIS — G4733 Obstructive sleep apnea (adult) (pediatric): Secondary | ICD-10-CM | POA: Diagnosis not present

## 2024-04-20 DIAGNOSIS — Z01812 Encounter for preprocedural laboratory examination: Secondary | ICD-10-CM | POA: Diagnosis not present

## 2024-04-20 DIAGNOSIS — G458 Other transient cerebral ischemic attacks and related syndromes: Secondary | ICD-10-CM | POA: Insufficient documentation

## 2024-04-20 DIAGNOSIS — E119 Type 2 diabetes mellitus without complications: Secondary | ICD-10-CM | POA: Diagnosis not present

## 2024-04-20 DIAGNOSIS — I708 Atherosclerosis of other arteries: Secondary | ICD-10-CM | POA: Diagnosis not present

## 2024-04-20 DIAGNOSIS — M069 Rheumatoid arthritis, unspecified: Secondary | ICD-10-CM | POA: Diagnosis not present

## 2024-04-20 DIAGNOSIS — Z79899 Other long term (current) drug therapy: Secondary | ICD-10-CM | POA: Insufficient documentation

## 2024-04-20 HISTORY — DX: Atherosclerotic heart disease of native coronary artery without angina pectoris: I25.10

## 2024-04-20 HISTORY — DX: Peripheral vascular disease, unspecified: I73.9

## 2024-04-20 LAB — CBC
HCT: 36.6 % (ref 36.0–46.0)
Hemoglobin: 12.5 g/dL (ref 12.0–15.0)
MCH: 31.6 pg (ref 26.0–34.0)
MCHC: 34.2 g/dL (ref 30.0–36.0)
MCV: 92.4 fL (ref 80.0–100.0)
Platelets: 304 K/uL (ref 150–400)
RBC: 3.96 MIL/uL (ref 3.87–5.11)
RDW: 12.1 % (ref 11.5–15.5)
WBC: 10.6 K/uL — ABNORMAL HIGH (ref 4.0–10.5)
nRBC: 0 % (ref 0.0–0.2)

## 2024-04-20 LAB — COMPREHENSIVE METABOLIC PANEL WITH GFR
ALT: 12 U/L (ref 0–44)
AST: 15 U/L (ref 15–41)
Albumin: 4.1 g/dL (ref 3.5–5.0)
Alkaline Phosphatase: 87 U/L (ref 38–126)
Anion gap: 11 (ref 5–15)
BUN: 27 mg/dL — ABNORMAL HIGH (ref 8–23)
CO2: 28 mmol/L (ref 22–32)
Calcium: 9.1 mg/dL (ref 8.9–10.3)
Chloride: 101 mmol/L (ref 98–111)
Creatinine, Ser: 1.18 mg/dL — ABNORMAL HIGH (ref 0.44–1.00)
GFR, Estimated: 46 mL/min — ABNORMAL LOW (ref >60)
Glucose, Bld: 137 mg/dL — ABNORMAL HIGH (ref 70–99)
Potassium: 3.8 mmol/L (ref 3.5–5.1)
Sodium: 140 mmol/L (ref 135–145)
Total Bilirubin: 1.6 mg/dL — ABNORMAL HIGH (ref 0.0–1.2)
Total Protein: 7.4 g/dL (ref 6.5–8.1)

## 2024-04-20 LAB — TYPE AND SCREEN
ABO/RH(D): O NEG
Antibody Screen: NEGATIVE

## 2024-04-20 LAB — URINALYSIS, ROUTINE W REFLEX MICROSCOPIC
Bacteria, UA: NONE SEEN
Bilirubin Urine: NEGATIVE
Glucose, UA: NEGATIVE mg/dL
Hgb urine dipstick: NEGATIVE
Ketones, ur: NEGATIVE mg/dL
Nitrite: NEGATIVE
Protein, ur: NEGATIVE mg/dL
Specific Gravity, Urine: 1.012 (ref 1.005–1.030)
pH: 5 (ref 5.0–8.0)

## 2024-04-20 LAB — SURGICAL PCR SCREEN
MRSA, PCR: NEGATIVE
Staphylococcus aureus: NEGATIVE

## 2024-04-20 LAB — GLUCOSE, CAPILLARY: Glucose-Capillary: 190 mg/dL — ABNORMAL HIGH (ref 70–99)

## 2024-04-20 LAB — PROTIME-INR
INR: 1.1 (ref 0.8–1.2)
Prothrombin Time: 14.4 s (ref 11.4–15.2)

## 2024-04-20 LAB — APTT: aPTT: 30 s (ref 24–36)

## 2024-04-20 MED ORDER — ACCU-CHEK GUIDE TEST VI STRP: ORAL_STRIP | 12 refills | Status: DC

## 2024-04-20 MED ORDER — ACCU-CHEK SOFTCLIX LANCETS MISC: 1 refills | Status: AC

## 2024-04-20 NOTE — Telephone Encounter (Signed)
 Patient came in to office today with spouse and states that her meter is not working properly and she would like to know what to do.  She states that she is having an upcoming surgical procedure and needs to provide blood sugar readings prior to her procedure.

## 2024-04-20 NOTE — Telephone Encounter (Signed)
 Test strips sent to [facility]

## 2024-04-20 NOTE — Progress Notes (Incomplete)
 PCP - Amy Fargo,NP Cardiologist - Mahesh Chandrasekhar,MDLOV 01/17/24  PPM/ICD - denies Device Orders -  Rep Notified -   Chest x-ray - na EKG - 10/04/23 Stress Test - denies ECHO - 07/08/21 Cardiac Cath - 10/22/22  Sleep Study - >15 years ago CPAP - no  Fasting Blood Sugar - 50-80 Checks Blood Sugar- uses a CGM,but does not have one currently. Her meter is not functioning but she states she will get one before surgery so she can check her blood sugar prior to her procedure.   Last dose of GLP1 agonist-  04/14/24 GLP1 instructions:   Blood Thinner Instructions:na Aspirin  Instructions:continue  ERAS Protcol -NPO PRE-SURGERY Ensure or G2-   COVID TEST- na   Anesthesia review: yes-  Patient denies shortness of breath, fever, cough and chest pain at PAT appointment   All instructions explained to the patient, with a verbal understanding of the material. Patient agrees to go over the instructions while at home for a better understanding.The opportunity to ask questions was provided.

## 2024-04-23 ENCOUNTER — Other Ambulatory Visit: Payer: Self-pay

## 2024-04-23 MED ORDER — ACCU-CHEK GUIDE TEST VI STRP: ORAL_STRIP | 12 refills | Status: AC

## 2024-04-23 NOTE — Progress Notes (Signed)
 Anesthesia Chart Review:  83 year old female with pertinent history including HTN, IDDM 2 (A1c 8.3 on 01/12/2024), nonobstructive CAD, left subclavian artery stenosis, rheumatoid arthritis, GERD on PPI, OSA not on CPAP  Coronary CTA in March 2023 showed nonobstructive CAD, medical therapy was recommended.  She underwent cardiac catheterization on 10/22/2022 that showed 50% mid left circumflex lesion, 50% distal left circumflex lesion, LVEDP 21 mmHg, EF greater than 65%.  She has experienced dizziness, elevated blood pressure and bendopnea.  Patient was recently seen by Dr. Arnetha in May and referred to Dr. Darron at due to symptomatic subclavian artery disease.  She underwent left subclavian artery aortic arch angiography by Dr. Darron on 12/28/2023, this revealed flush heavily calcified occlusion on the left subclavian artery just before the origin of the left vertebral artery.  The left subclavian artery fills via retrograde flow from the left vertebral artery.  No significant obstructive disease affecting the innominate artery and the left carotid artery.  Due to long heavily calcified flush occlusion of the left subclavian artery, it was not felt suitable for endovascular intervention.  Recommend either medical therapy versus carotid to subclavian/axillary artery bypass.  She has been evaluated with vascular surgery and neurology and it is felt that her symptoms are consistent with left subclavian stenosis.  Patient reports last dose GLP-1 agonist 04/14/2024.  Preop labs reviewed, creatinine mildly elevated 1.18, otherwise unremarkable.  EKG 10/04/2023: Sinus rhythm with Premature atrial complexes.  Rate 84. Nonspecific ST abnormality  Carotid duplex 10/26/2023: Summary:  Right Carotid: Velocities in the right ICA are consistent with a 1-39% stenosis.  Left Carotid: Velocities in the left ICA are consistent with a 1-39% stenosis. Non-hemodynamically significant plaque <50% noted in the CCA.   Vertebrals: Right vertebral artery demonstrates antegrade flow. Left vertebral artery demonstrates retrograde flow.  Subclavians: Left subclavian artery flow was disturbed with dampened flow.  Known left subclavian steal syndrome. Normal flow hemodynamics were seen in the right subclavian artery.   Cath 10/22/2022:   Mid Cx lesion is 50% stenosed.  Moderate calcification.   Dist Cx lesion is 50% stenosed.   The left ventricular systolic function is normal.   LV end diastolic pressure is mildly elevated.  LVEDP 21 mmHg.   The left ventricular ejection fraction is greater than 65% by visual estimate.   There is no aortic valve stenosis.   On 2L [place] Aortic saturation 100%, PA saturation 79%, RA pressure 17/13, mean RA pressure 12 mmHg, PA pressure 39/19, mean PA pressure 25 mmHg, pulmonary capillary wedge pressure 22/22, mean pulmonary capillary wedge pressure 22 mmHg, cardiac output 6 L/min, cardiac index 3.5.   Heavily calcified proximal left subclavian with delayed filling.   Nonobstructive coronary artery disease.  Upper normal pulmonary artery pressures.   Heavily calcified proximal left subclavian.       Lynwood Geofm RIGGERS [facility] Short Stay Center/Anesthesiology Phone [phone] 04/23/2024 3:16 PM

## 2024-04-23 NOTE — Telephone Encounter (Signed)
 complete

## 2024-04-23 NOTE — Anesthesia Preprocedure Evaluation (Signed)
 Anesthesia Evaluation  Patient identified by MRN, date of birth, ID band Patient awake    Reviewed: Allergy & Precautions, NPO status , Patient's Chart, lab work & pertinent test results  Airway Mallampati: III  TM Distance: >3 FB Neck ROM: Full    Dental no notable dental hx.    Pulmonary sleep apnea , former smoker   Pulmonary exam normal        Cardiovascular hypertension, Pt. on medications and Pt. on home beta blockers + CAD and + Peripheral Vascular Disease  Normal cardiovascular exam     Neuro/Psych   Anxiety     negative neurological ROS     GI/Hepatic Neg liver ROS,GERD  Medicated and Controlled,,  Endo/Other  diabetes, Insulin  Dependent  Patient on GLP-1 Agonist  Renal/GU Renal disease     Musculoskeletal  (+) Arthritis ,    Abdominal   Peds  Hematology negative hematology ROS (+)   Anesthesia Other Findings Left subclavian artery occlusion  Reproductive/Obstetrics                              Anesthesia Physical Anesthesia Plan  ASA: 3  Anesthesia Plan: General   Post-op Pain Management:    Induction: Intravenous  PONV Risk Score and Plan: 3 and Ondansetron , Dexamethasone  and Treatment may vary due to age or medical condition  Airway Management Planned: Oral ETT  Additional Equipment: Arterial line  Intra-op Plan:   Post-operative Plan: Extubation in OR  Informed Consent: I have reviewed the patients History and Physical, chart, labs and discussed the procedure including the risks, benefits and alternatives for the proposed anesthesia with the patient or authorized representative who has indicated his/her understanding and acceptance.     Dental advisory given  Plan Discussed with: CRNA  Anesthesia Plan Comments: (PAT note by Lynwood Hope, PA-C: 83 year old female with pertinent history including HTN, IDDM 2 (A1c 8.3 on 01/12/2024), nonobstructive CAD, left  subclavian artery stenosis, rheumatoid arthritis, GERD on PPI, OSA not on CPAP  Coronary CTA in March 2023 showed nonobstructive CAD, medical therapy was recommended.  She underwent cardiac catheterization on 10/22/2022 that showed 50% mid left circumflex lesion, 50% distal left circumflex lesion, LVEDP 21 mmHg, EF greater than 65%.  She has experienced dizziness, elevated blood pressure and bendopnea.  Patient was recently seen by Dr. Arnetha in May and referred to Dr. Darron at due to symptomatic subclavian artery disease.  She underwent left subclavian artery aortic arch angiography by Dr. Darron on 12/28/2023, this revealed flush heavily calcified occlusion on the left subclavian artery just before the origin of the left vertebral artery.  The left subclavian artery fills via retrograde flow from the left vertebral artery.  No significant obstructive disease affecting the innominate artery and the left carotid artery.  Due to long heavily calcified flush occlusion of the left subclavian artery, it was not felt suitable for endovascular intervention.  Recommend either medical therapy versus carotid to subclavian/axillary artery bypass.  She has been evaluated with vascular surgery and neurology and it is felt that her symptoms are consistent with left subclavian stenosis.  Patient reports last dose GLP-1 agonist 04/14/2024.  Preop labs reviewed, creatinine mildly elevated 1.18, otherwise unremarkable.  EKG 10/04/2023: Sinus rhythm with Premature atrial complexes.  Rate 84. Nonspecific ST abnormality  Carotid duplex 10/26/2023: Summary:  Right Carotid: Velocities in the right ICA are consistent with a 1-39% stenosis.  Left Carotid: Velocities in the left ICA  are consistent with a 1-39% stenosis. Non-hemodynamically significant plaque <50% noted in the CCA.  Vertebrals: Right vertebral artery demonstrates antegrade flow. Left vertebral artery demonstrates retrograde flow.  Subclavians: Left subclavian  artery flow was disturbed with dampened flow.  Known left subclavian steal syndrome. Normal flow hemodynamics were seen in the right subclavian artery.   Cath 10/22/2022:   Mid Cx lesion is 50% stenosed.  Moderate calcification.   Dist Cx lesion is 50% stenosed.   The left ventricular systolic function is normal.   LV end diastolic pressure is mildly elevated.  LVEDP 21 mmHg.   The left ventricular ejection fraction is greater than 65% by visual estimate.   There is no aortic valve stenosis.   On 2L [place] Aortic saturation 100%, PA saturation 79%, RA pressure 17/13, mean RA pressure 12 mmHg, PA pressure 39/19, mean PA pressure 25 mmHg, pulmonary capillary wedge pressure 22/22, mean pulmonary capillary wedge pressure 22 mmHg, cardiac output 6 L/min, cardiac index 3.5.   Heavily calcified proximal left subclavian with delayed filling.   Nonobstructive coronary artery disease.  Upper normal pulmonary artery pressures.   Heavily calcified proximal left subclavian.     )         Anesthesia Quick Evaluation

## 2024-04-24 ENCOUNTER — Telehealth: Payer: Self-pay

## 2024-04-24 NOTE — Telephone Encounter (Signed)
 Patient states that she was given directions and has not been on Ozempic  for a week. She saw a sentence that said to notify you of the surgery and that's why she called. No further action needed

## 2024-04-24 NOTE — Telephone Encounter (Signed)
 Patient scheduled for surgery tomorrow and wants to know if she needs to make any medication adjustment.

## 2024-04-25 ENCOUNTER — Other Ambulatory Visit: Payer: Self-pay

## 2024-04-25 ENCOUNTER — Inpatient Hospital Stay (HOSPITAL_COMMUNITY): Admitting: Certified Registered Nurse Anesthetist

## 2024-04-25 ENCOUNTER — Encounter (HOSPITAL_COMMUNITY): Payer: Self-pay | Admitting: Surgery

## 2024-04-25 ENCOUNTER — Inpatient Hospital Stay (HOSPITAL_COMMUNITY)

## 2024-04-25 ENCOUNTER — Inpatient Hospital Stay (HOSPITAL_COMMUNITY): Admitting: Physician Assistant

## 2024-04-25 ENCOUNTER — Encounter (HOSPITAL_COMMUNITY)
Admission: RE | Disposition: A | Discharge status: Home / Self Care | Payer: Self-pay | Source: Home / Self Care | Attending: Surgery

## 2024-04-25 ENCOUNTER — Inpatient Hospital Stay (HOSPITAL_COMMUNITY)
Admission: RE | Admit: 2024-04-25 | Discharge: 2024-04-26 | DRG: 271 | Disposition: A | Discharge status: Home / Self Care | Attending: Surgery | Admitting: Surgery

## 2024-04-25 DIAGNOSIS — Z91040 Latex allergy status: Secondary | ICD-10-CM

## 2024-04-25 DIAGNOSIS — Z794 Long term (current) use of insulin: Secondary | ICD-10-CM

## 2024-04-25 DIAGNOSIS — I700 Atherosclerosis of aorta: Secondary | ICD-10-CM | POA: Diagnosis not present

## 2024-04-25 DIAGNOSIS — Z7985 Long-term (current) use of injectable non-insulin antidiabetic drugs: Secondary | ICD-10-CM

## 2024-04-25 DIAGNOSIS — I708 Atherosclerosis of other arteries: Secondary | ICD-10-CM | POA: Diagnosis not present

## 2024-04-25 DIAGNOSIS — Z888 Allergy status to other drugs, medicaments and biological substances status: Secondary | ICD-10-CM | POA: Diagnosis not present

## 2024-04-25 DIAGNOSIS — Z90710 Acquired absence of both cervix and uterus: Secondary | ICD-10-CM

## 2024-04-25 DIAGNOSIS — Z85828 Personal history of other malignant neoplasm of skin: Secondary | ICD-10-CM | POA: Diagnosis not present

## 2024-04-25 DIAGNOSIS — E7800 Pure hypercholesterolemia, unspecified: Secondary | ICD-10-CM | POA: Diagnosis present

## 2024-04-25 DIAGNOSIS — M069 Rheumatoid arthritis, unspecified: Secondary | ICD-10-CM | POA: Diagnosis present

## 2024-04-25 DIAGNOSIS — I10 Essential (primary) hypertension: Secondary | ICD-10-CM | POA: Diagnosis not present

## 2024-04-25 DIAGNOSIS — G458 Other transient cerebral ischemic attacks and related syndromes: Secondary | ICD-10-CM

## 2024-04-25 DIAGNOSIS — Z8249 Family history of ischemic heart disease and other diseases of the circulatory system: Secondary | ICD-10-CM | POA: Diagnosis not present

## 2024-04-25 DIAGNOSIS — Z885 Allergy status to narcotic agent status: Secondary | ICD-10-CM | POA: Diagnosis not present

## 2024-04-25 DIAGNOSIS — Z8379 Family history of other diseases of the digestive system: Secondary | ICD-10-CM

## 2024-04-25 DIAGNOSIS — Z887 Allergy status to serum and vaccine status: Secondary | ICD-10-CM

## 2024-04-25 DIAGNOSIS — Z833 Family history of diabetes mellitus: Secondary | ICD-10-CM | POA: Diagnosis not present

## 2024-04-25 DIAGNOSIS — R918 Other nonspecific abnormal finding of lung field: Secondary | ICD-10-CM | POA: Diagnosis not present

## 2024-04-25 DIAGNOSIS — Z87891 Personal history of nicotine dependence: Secondary | ICD-10-CM

## 2024-04-25 DIAGNOSIS — J90 Pleural effusion, not elsewhere classified: Secondary | ICD-10-CM | POA: Diagnosis not present

## 2024-04-25 DIAGNOSIS — Z9889 Other specified postprocedural states: Secondary | ICD-10-CM | POA: Diagnosis not present

## 2024-04-25 DIAGNOSIS — I771 Stricture of artery: Secondary | ICD-10-CM | POA: Diagnosis present

## 2024-04-25 DIAGNOSIS — Z79899 Other long term (current) drug therapy: Secondary | ICD-10-CM | POA: Diagnosis not present

## 2024-04-25 DIAGNOSIS — I2510 Atherosclerotic heart disease of native coronary artery without angina pectoris: Secondary | ICD-10-CM | POA: Diagnosis not present

## 2024-04-25 DIAGNOSIS — Z9049 Acquired absence of other specified parts of digestive tract: Secondary | ICD-10-CM

## 2024-04-25 DIAGNOSIS — Z87442 Personal history of urinary calculi: Secondary | ICD-10-CM | POA: Diagnosis not present

## 2024-04-25 DIAGNOSIS — Z7982 Long term (current) use of aspirin: Secondary | ICD-10-CM

## 2024-04-25 HISTORY — PX: CAROTID-SUBCLAVIAN BYPASS GRAFT: SHX910

## 2024-04-25 LAB — GLUCOSE, CAPILLARY
Glucose-Capillary: 192 mg/dL — ABNORMAL HIGH (ref 70–99)
Glucose-Capillary: 154 mg/dL — ABNORMAL HIGH (ref 70–99)
Glucose-Capillary: 184 mg/dL — ABNORMAL HIGH (ref 70–99)
Glucose-Capillary: 209 mg/dL — ABNORMAL HIGH (ref 70–99)
Glucose-Capillary: 199 mg/dL — ABNORMAL HIGH (ref 70–99)

## 2024-04-25 LAB — ABO/RH: ABO/RH(D): O NEG

## 2024-04-25 LAB — POCT ACTIVATED CLOTTING TIME
Activated Clotting Time: 245 s
Activated Clotting Time: 227 s

## 2024-04-25 SURGERY — CREATION, BYPASS, ARTERIAL, SUBCLAVIAN TO CAROTID, USING GRAFT
Anesthesia: General | Site: Neck | Laterality: Left

## 2024-04-25 MED ORDER — HYDRALAZINE HCL 20 MG/ML IJ SOLN: 5.0000 mg | INTRAMUSCULAR | Status: DC | PRN

## 2024-04-25 MED ORDER — LABETALOL HCL 5 MG/ML IV SOLN
INTRAVENOUS | Status: DC | PRN
  Administered 2024-04-25 (×2): 5 mg via INTRAVENOUS

## 2024-04-25 MED ORDER — LOPERAMIDE HCL 2 MG PO CAPS: 2.0000 mg | ORAL_CAPSULE | Freq: Every day | ORAL | Status: DC | PRN

## 2024-04-25 MED ORDER — CEFAZOLIN SODIUM-DEXTROSE 2-4 GM/100ML-% IV SOLN
2.0000 g | INTRAVENOUS | Status: AC
  Administered 2024-04-25: 2 g via INTRAVENOUS
  Filled 2024-04-25: qty 100

## 2024-04-25 MED ORDER — CEFAZOLIN SODIUM-DEXTROSE 2-4 GM/100ML-% IV SOLN
2.0000 g | Freq: Once | INTRAVENOUS | Status: AC
  Administered 2024-04-25: 2 g via INTRAVENOUS
  Filled 2024-04-25: qty 100

## 2024-04-25 MED ORDER — METHENAMINE MANDELATE 0.5 G PO TABS
1.0000 g | ORAL_TABLET | Freq: Two times a day (BID) | ORAL | Status: DC
  Administered 2024-04-25: 0.5 g via ORAL
  Administered 2024-04-26: 1 g via ORAL
  Filled 2024-04-25 (×3): qty 2

## 2024-04-25 MED ORDER — FENTANYL CITRATE (PF) 100 MCG/2ML IJ SOLN
INTRAMUSCULAR | Status: AC
  Filled 2024-04-25: qty 2

## 2024-04-25 MED ORDER — LIDOCAINE 2% (20 MG/ML) 5 ML SYRINGE
INTRAMUSCULAR | Status: AC
  Filled 2024-04-25: qty 5

## 2024-04-25 MED ORDER — PHENYLEPHRINE HCL-NACL 20-0.9 MG/250ML-% IV SOLN
INTRAVENOUS | Status: DC | PRN
  Administered 2024-04-25: 30 ug/min via INTRAVENOUS

## 2024-04-25 MED ORDER — CHLORHEXIDINE GLUCONATE CLOTH 2 % EX PADS: 6.0000 | MEDICATED_PAD | Freq: Once | CUTANEOUS | Status: DC

## 2024-04-25 MED ORDER — PHENOL 1.4 % MT LIQD: 1.0000 | OROMUCOSAL | Status: DC | PRN

## 2024-04-25 MED ORDER — DOCUSATE SODIUM 100 MG PO CAPS
100.0000 mg | ORAL_CAPSULE | Freq: Every day | ORAL | Status: DC
  Administered 2024-04-26: 100 mg via ORAL
  Filled 2024-04-25: qty 1

## 2024-04-25 MED ORDER — ACETAMINOPHEN 650 MG RE SUPP: 325.0000 mg | RECTAL | Status: DC | PRN

## 2024-04-25 MED ORDER — CARVEDILOL 12.5 MG PO TABS
12.5000 mg | ORAL_TABLET | Freq: Two times a day (BID) | ORAL | Status: DC
  Administered 2024-04-25 – 2024-04-26 (×2): 12.5 mg via ORAL
  Filled 2024-04-25 (×2): qty 1

## 2024-04-25 MED ORDER — ONDANSETRON HCL 4 MG/2ML IJ SOLN
INTRAMUSCULAR | Status: AC
  Filled 2024-04-25: qty 2

## 2024-04-25 MED ORDER — PROPOFOL 10 MG/ML IV BOLUS
INTRAVENOUS | Status: AC
Start: 2024-04-25 — End: 2024-04-25
  Filled 2024-04-25: qty 20

## 2024-04-25 MED ORDER — ROCURONIUM BROMIDE 10 MG/ML (PF) SYRINGE
PREFILLED_SYRINGE | INTRAVENOUS | Status: AC
  Filled 2024-04-25: qty 10

## 2024-04-25 MED ORDER — ONDANSETRON HCL 4 MG/2ML IJ SOLN
INTRAMUSCULAR | Status: DC | PRN
  Administered 2024-04-25: 4 mg via INTRAVENOUS

## 2024-04-25 MED ORDER — SODIUM CHLORIDE 0.9 % IV SOLN: 500.0000 mL | Freq: Once | INTRAVENOUS | Status: DC | PRN

## 2024-04-25 MED ORDER — ROCURONIUM BROMIDE 10 MG/ML (PF) SYRINGE
PREFILLED_SYRINGE | INTRAVENOUS | Status: DC | PRN
  Administered 2024-04-25: 60 mg via INTRAVENOUS
  Administered 2024-04-25: 30 mg via INTRAVENOUS
  Administered 2024-04-25: 40 mg via INTRAVENOUS

## 2024-04-25 MED ORDER — ACETAMINOPHEN 10 MG/ML IV SOLN: 1000.0000 mg | Freq: Once | INTRAVENOUS | Status: DC | PRN

## 2024-04-25 MED ORDER — PROTAMINE SULFATE 10 MG/ML IV SOLN
INTRAVENOUS | Status: DC | PRN
  Administered 2024-04-25: 50 mg via INTRAVENOUS

## 2024-04-25 MED ORDER — PHENYLEPHRINE 80 MCG/ML (10ML) SYRINGE FOR IV PUSH (FOR BLOOD PRESSURE SUPPORT)
PREFILLED_SYRINGE | INTRAVENOUS | Status: DC | PRN
  Administered 2024-04-25 (×3): 80 ug via INTRAVENOUS

## 2024-04-25 MED ORDER — PHENYLEPHRINE HCL-NACL 20-0.9 MG/250ML-% IV SOLN
INTRAVENOUS | Status: AC
  Filled 2024-04-25: qty 250

## 2024-04-25 MED ORDER — PROPOFOL 10 MG/ML IV BOLUS
INTRAVENOUS | Status: DC | PRN
  Administered 2024-04-25 (×2): 50 mg via INTRAVENOUS
  Administered 2024-04-25: 30 mg via INTRAVENOUS
  Administered 2024-04-25 (×3): 50 mg via INTRAVENOUS

## 2024-04-25 MED ORDER — CHLORTHALIDONE 25 MG PO TABS
25.0000 mg | ORAL_TABLET | Freq: Every day | ORAL | Status: DC
  Administered 2024-04-25 – 2024-04-26 (×2): 25 mg via ORAL
  Filled 2024-04-25 (×2): qty 1

## 2024-04-25 MED ORDER — HEMOSTATIC AGENTS (NO CHARGE) OPTIME
TOPICAL | Status: DC | PRN
  Administered 2024-04-25: 1 via TOPICAL

## 2024-04-25 MED ORDER — INSULIN ASPART 100 UNIT/ML IJ SOLN
0.0000 [IU] | Freq: Three times a day (TID) | INTRAMUSCULAR | Status: DC
  Administered 2024-04-25: 5 [IU] via SUBCUTANEOUS
  Administered 2024-04-26: 8 [IU] via SUBCUTANEOUS

## 2024-04-25 MED ORDER — HEPARIN 6000 UNIT IRRIGATION SOLUTION
Status: AC
Start: 2024-04-25 — End: 2024-04-25
  Filled 2024-04-25: qty 500

## 2024-04-25 MED ORDER — LIDOCAINE 2% (20 MG/ML) 5 ML SYRINGE
INTRAMUSCULAR | Status: DC | PRN
  Administered 2024-04-25: 60 mg via INTRAVENOUS

## 2024-04-25 MED ORDER — NITROGLYCERIN 0.4 MG SL SUBL: 0.4000 mg | SUBLINGUAL_TABLET | SUBLINGUAL | Status: DC | PRN

## 2024-04-25 MED ORDER — CHLORHEXIDINE GLUCONATE 0.12 % MT SOLN
15.0000 mL | Freq: Once | OROMUCOSAL | Status: AC
  Administered 2024-04-25: 15 mL via OROMUCOSAL
  Filled 2024-04-25: qty 15

## 2024-04-25 MED ORDER — ACETAMINOPHEN 10 MG/ML IV SOLN
INTRAVENOUS | Status: AC
  Filled 2024-04-25: qty 100

## 2024-04-25 MED ORDER — SODIUM CHLORIDE 0.9 % IV SOLN: INTRAVENOUS | Status: DC

## 2024-04-25 MED ORDER — CLOBETASOL PROPIONATE 0.05 % EX CREA: 1.0000 | TOPICAL_CREAM | Freq: Every day | CUTANEOUS | Status: DC | PRN

## 2024-04-25 MED ORDER — ONDANSETRON HCL 4 MG/2ML IJ SOLN
4.0000 mg | Freq: Once | INTRAMUSCULAR | Status: AC | PRN
  Administered 2024-04-25: 4 mg via INTRAVENOUS

## 2024-04-25 MED ORDER — ONDANSETRON HCL 4 MG/2ML IJ SOLN: 4.0000 mg | Freq: Four times a day (QID) | INTRAMUSCULAR | Status: DC | PRN

## 2024-04-25 MED ORDER — INSULIN DEGLUDEC 200 UNIT/ML ~~LOC~~ SOPN: 30.0000 [IU] | PEN_INJECTOR | Freq: Every day | SUBCUTANEOUS | Status: DC

## 2024-04-25 MED ORDER — INSULIN ASPART 100 UNIT/ML IJ SOLN
0.0000 [IU] | INTRAMUSCULAR | Status: DC | PRN
  Administered 2024-04-25: 2 [IU] via SUBCUTANEOUS
  Filled 2024-04-25: qty 1

## 2024-04-25 MED ORDER — LABETALOL HCL 5 MG/ML IV SOLN
INTRAVENOUS | Status: AC
Start: 2024-04-25 — End: 2024-04-25
  Filled 2024-04-25: qty 4

## 2024-04-25 MED ORDER — SIMVASTATIN 20 MG PO TABS
20.0000 mg | ORAL_TABLET | Freq: Every evening | ORAL | Status: DC
  Administered 2024-04-25: 20 mg via ORAL
  Filled 2024-04-25: qty 1

## 2024-04-25 MED ORDER — METOPROLOL TARTRATE 5 MG/5ML IV SOLN: 2.5000 mg | INTRAVENOUS | Status: DC | PRN

## 2024-04-25 MED ORDER — SURGIFLO WITH THROMBIN (HEMOSTATIC MATRIX KIT) OPTIME
TOPICAL | Status: DC | PRN
  Administered 2024-04-25: 1 via TOPICAL

## 2024-04-25 MED ORDER — IRBESARTAN 300 MG PO TABS
300.0000 mg | ORAL_TABLET | Freq: Every day | ORAL | Status: DC
  Administered 2024-04-25 – 2024-04-26 (×2): 300 mg via ORAL
  Filled 2024-04-25 (×2): qty 1

## 2024-04-25 MED ORDER — INSULIN GLARGINE-YFGN 100 UNIT/ML ~~LOC~~ SOLN
30.0000 [IU] | Freq: Every day | SUBCUTANEOUS | Status: DC
  Administered 2024-04-25: 30 [IU] via SUBCUTANEOUS
  Filled 2024-04-25 (×2): qty 0.3

## 2024-04-25 MED ORDER — FENTANYL CITRATE (PF) 250 MCG/5ML IJ SOLN
INTRAMUSCULAR | Status: DC | PRN
  Administered 2024-04-25: 75 ug via INTRAVENOUS
  Administered 2024-04-25: 50 ug via INTRAVENOUS
  Administered 2024-04-25 (×4): 25 ug via INTRAVENOUS

## 2024-04-25 MED ORDER — ACETAMINOPHEN 325 MG PO TABS
325.0000 mg | ORAL_TABLET | ORAL | Status: DC | PRN
  Administered 2024-04-25 – 2024-04-26 (×2): 650 mg via ORAL
  Filled 2024-04-25 (×2): qty 2

## 2024-04-25 MED ORDER — INSULIN LISPRO (1 UNIT DIAL) 100 UNIT/ML (KWIKPEN): 6.0000 [IU] | PEN_INJECTOR | Freq: Three times a day (TID) | SUBCUTANEOUS | Status: DC

## 2024-04-25 MED ORDER — LACTATED RINGERS IV SOLN: INTRAVENOUS | Status: DC | PRN

## 2024-04-25 MED ORDER — ACETAMINOPHEN 10 MG/ML IV SOLN
INTRAVENOUS | Status: DC | PRN
  Administered 2024-04-25: 1000 mg via INTRAVENOUS

## 2024-04-25 MED ORDER — DIAZEPAM 5 MG PO TABS: 5.0000 mg | ORAL_TABLET | Freq: Four times a day (QID) | ORAL | Status: DC | PRN

## 2024-04-25 MED ORDER — HYDROMORPHONE HCL 1 MG/ML IJ SOLN: 0.5000 mg | INTRAMUSCULAR | Status: DC | PRN

## 2024-04-25 MED ORDER — AMISULPRIDE (ANTIEMETIC) 5 MG/2ML IV SOLN: 10.0000 mg | Freq: Once | INTRAVENOUS | Status: DC | PRN

## 2024-04-25 MED ORDER — LABETALOL HCL 5 MG/ML IV SOLN: 10.0000 mg | INTRAVENOUS | Status: DC | PRN

## 2024-04-25 MED ORDER — ASPIRIN 81 MG PO TBEC
81.0000 mg | DELAYED_RELEASE_TABLET | Freq: Every day | ORAL | Status: DC
  Administered 2024-04-26: 81 mg via ORAL
  Filled 2024-04-25: qty 1

## 2024-04-25 MED ORDER — SUGAMMADEX SODIUM 200 MG/2ML IV SOLN
INTRAVENOUS | Status: DC | PRN
  Administered 2024-04-25: 300 mg via INTRAVENOUS

## 2024-04-25 MED ORDER — LORAZEPAM 0.5 MG PO TABS
0.5000 mg | ORAL_TABLET | Freq: Every day | ORAL | Status: DC
  Administered 2024-04-25: 0.5 mg via ORAL
  Filled 2024-04-25: qty 1

## 2024-04-25 MED ORDER — HEPARIN 6000 UNIT IRRIGATION SOLUTION
Status: DC | PRN
  Administered 2024-04-25: 1

## 2024-04-25 MED ORDER — LIDOCAINE HCL (PF) 1 % IJ SOLN
INTRAMUSCULAR | Status: AC
Start: 2024-04-25 — End: 2024-04-25
  Filled 2024-04-25: qty 30

## 2024-04-25 MED ORDER — LABETALOL HCL 5 MG/ML IV SOLN
5.0000 mg | Freq: Once | INTRAVENOUS | Status: AC
  Administered 2024-04-25: 5 mg via INTRAVENOUS

## 2024-04-25 MED ORDER — ONDANSETRON HCL 4 MG PO TABS: 4.0000 mg | ORAL_TABLET | Freq: Three times a day (TID) | ORAL | Status: DC | PRN

## 2024-04-25 MED ORDER — POTASSIUM CHLORIDE CRYS ER 20 MEQ PO TBCR: 40.0000 meq | EXTENDED_RELEASE_TABLET | Freq: Every day | ORAL | Status: DC | PRN

## 2024-04-25 MED ORDER — POLYETHYLENE GLYCOL 3350 17 G PO PACK: 17.0000 g | PACK | Freq: Every day | ORAL | Status: DC | PRN

## 2024-04-25 MED ORDER — OXYCODONE-ACETAMINOPHEN 5-325 MG PO TABS: 1.0000 | ORAL_TABLET | Freq: Four times a day (QID) | ORAL | Status: DC | PRN

## 2024-04-25 MED ORDER — DEXAMETHASONE SOD PHOSPHATE PF 10 MG/ML IJ SOLN
INTRAMUSCULAR | Status: DC | PRN
  Administered 2024-04-25: 4 mg via INTRAVENOUS

## 2024-04-25 MED ORDER — LABETALOL HCL 5 MG/ML IV SOLN
10.0000 mg | Freq: Once | INTRAVENOUS | Status: AC
  Administered 2024-04-25: 10 mg via INTRAVENOUS

## 2024-04-25 MED ORDER — BISACODYL 10 MG RE SUPP: 10.0000 mg | Freq: Every day | RECTAL | Status: DC | PRN

## 2024-04-25 MED ORDER — HEPARIN SODIUM (PORCINE) 1000 UNIT/ML IJ SOLN
INTRAMUSCULAR | Status: DC | PRN
  Administered 2024-04-25: 7000 [IU] via INTRAVENOUS

## 2024-04-25 MED ORDER — LACTATED RINGERS IV SOLN: INTRAVENOUS | Status: DC

## 2024-04-25 MED ORDER — 0.9 % SODIUM CHLORIDE (POUR BTL) OPTIME
TOPICAL | Status: DC | PRN
  Administered 2024-04-25: 2000 mL

## 2024-04-25 MED ORDER — FENTANYL CITRATE (PF) 100 MCG/2ML IJ SOLN: 25.0000 ug | INTRAMUSCULAR | Status: DC | PRN

## 2024-04-25 MED ORDER — ORAL CARE MOUTH RINSE: 15.0000 mL | Freq: Once | OROMUCOSAL | Status: AC

## 2024-04-25 SURGICAL SUPPLY — 30 items
BAG COUNTER SPONGE SURGICOUNT (BAG) ×1 IMPLANT
BAG DECANTER FOR FLEXI CONT (MISCELLANEOUS) ×1 IMPLANT
CANISTER SUCTION 3000ML PPV (SUCTIONS) ×1 IMPLANT
CLIP TI MEDIUM 24 (CLIP) ×1 IMPLANT
CLIP TI WIDE RED SMALL 24 (CLIP) ×1 IMPLANT
COVER PROBE W GEL 5X96 (DRAPES) IMPLANT
DERMABOND ADVANCED .7 DNX12 (GAUZE/BANDAGES/DRESSINGS) ×1 IMPLANT
ELECTRODE REM PT RTRN 9FT ADLT (ELECTROSURGICAL) ×1 IMPLANT
GLOVE BIOGEL PI IND STRL 8 (GLOVE) ×1 IMPLANT
GLOVE SURG SS PI 7.5 STRL IVOR (GLOVE) ×1 IMPLANT
GOWN STRL REUS W/ TWL LRG LVL3 (GOWN DISPOSABLE) ×2 IMPLANT
GOWN STRL REUS W/ TWL XL LVL3 (GOWN DISPOSABLE) ×1 IMPLANT
GRAFT CV 30X6KNTD STRG TUBE (Vascular Products) IMPLANT
HEMOSTAT SNOW SURGICEL 2X4 (HEMOSTASIS) IMPLANT
KIT BASIN OR (CUSTOM PROCEDURE TRAY) ×1 IMPLANT
KIT TURNOVER KIT B (KITS) ×1 IMPLANT
PACK CAROTID (CUSTOM PROCEDURE TRAY) ×1 IMPLANT
PAD ARMBOARD POSITIONER FOAM (MISCELLANEOUS) ×2 IMPLANT
POSITIONER HEAD DONUT 9IN (MISCELLANEOUS) ×1 IMPLANT
PUNCH AORTIC ROTATE 5MM 8IN (MISCELLANEOUS) IMPLANT
SOLN 0.9% NACL POUR BTL 1000ML (IV SOLUTION) ×2 IMPLANT
SOLN STERILE WATER BTL 1000 ML (IV SOLUTION) ×1 IMPLANT
SURGIFLO W/THROMBIN 8M KIT (HEMOSTASIS) IMPLANT
SUT PROLENE 5 0 C 1 24 (SUTURE) ×1 IMPLANT
SUT PROLENE 6 0 BV (SUTURE) ×1 IMPLANT
SUT SILK 3-0 18XBRD TIE 12 (SUTURE) IMPLANT
SUT VIC AB 3-0 SH 27X BRD (SUTURE) ×2 IMPLANT
SUT VIC AB 3-0 X1 27 (SUTURE) ×1 IMPLANT
SUT VIC AB 4-0 PS2 18 (SUTURE) IMPLANT
TOWEL GREEN STERILE (TOWEL DISPOSABLE) ×1 IMPLANT

## 2024-04-25 NOTE — Discharge Instructions (Signed)

## 2024-04-25 NOTE — Plan of Care (Signed)
  Problem: Education: Goal: Knowledge of General Education information will improve Description: Including pain rating scale, medication(s)/side effects and non-pharmacologic comfort measures Outcome: Progressing   Problem: Health Behavior/Discharge Planning: Goal: Ability to manage health-related needs will improve Outcome: Progressing   Problem: Clinical Measurements: Goal: Ability to maintain clinical measurements within normal limits will improve Outcome: Progressing Goal: Will remain free from infection Outcome: Progressing Goal: Diagnostic test results will improve Outcome: Progressing Goal: Respiratory complications will improve Outcome: Progressing Goal: Cardiovascular complication will be avoided Outcome: Progressing   Problem: Activity: Goal: Risk for activity intolerance will decrease Outcome: Progressing   Problem: Nutrition: Goal: Adequate nutrition will be maintained Outcome: Progressing   Problem: Coping: Goal: Level of anxiety will decrease Outcome: Progressing   Problem: Elimination: Goal: Will not experience complications related to bowel motility Outcome: Progressing Goal: Will not experience complications related to urinary retention Outcome: Progressing   Problem: Pain Managment: Goal: General experience of comfort will improve and/or be controlled Outcome: Progressing   Problem: Safety: Goal: Ability to remain free from injury will improve Outcome: Progressing   Problem: Skin Integrity: Goal: Risk for impaired skin integrity will decrease Outcome: Progressing   Problem: Education: Goal: Ability to describe self-care measures that may prevent or decrease complications (Diabetes Survival Skills Education) will improve Outcome: Progressing Goal: Individualized Educational Video(s) Outcome: Progressing   Problem: Coping: Goal: Ability to adjust to condition or change in health will improve Outcome: Progressing   Problem: Fluid  Volume: Goal: Ability to maintain a balanced intake and output will improve Outcome: Progressing   Problem: Health Behavior/Discharge Planning: Goal: Ability to identify and utilize available resources and services will improve Outcome: Progressing Goal: Ability to manage health-related needs will improve Outcome: Progressing   Problem: Metabolic: Goal: Ability to maintain appropriate glucose levels will improve Outcome: Progressing   Problem: Nutritional: Goal: Maintenance of adequate nutrition will improve Outcome: Progressing Goal: Progress toward achieving an optimal weight will improve Outcome: Progressing   Problem: Skin Integrity: Goal: Risk for impaired skin integrity will decrease Outcome: Progressing   Problem: Tissue Perfusion: Goal: Adequacy of tissue perfusion will improve Outcome: Progressing   Problem: Education: Goal: Knowledge of discharge needs will improve Outcome: Progressing   Problem: Clinical Measurements: Goal: Postoperative complications will be avoided or minimized Outcome: Progressing   Problem: Respiratory: Goal: Will achieve and/or maintain a regular respiratory rate, without signs or symptoms of dyspnea Outcome: Progressing   Problem: Skin Integrity: Goal: Demonstration of wound healing without infection will improve Outcome: Progressing

## 2024-04-25 NOTE — Transfer of Care (Signed)
 Immediate Anesthesia Transfer of Care Note  Patient: Annette Suarez  Procedure(s) Performed: CREATION, BYPASS, ARTERIAL, SUBCLAVIAN TO CAROTID, USING X 30CM HEMASHIELD GOLD GRAFT (Left: Neck)  Patient Location: PACU  Anesthesia Type:General  Level of Consciousness: awake and drowsy  Airway & Oxygen Therapy: Patient Spontanous Breathing and Patient connected to face mask oxygen  Post-op Assessment: Report given to RN and Post -op Vital signs reviewed and stable  Post vital signs: Reviewed and stable  Last Vitals:  Vitals Value Taken Time  BP 103/92 04/25/24 10:36  Temp    Pulse 77 04/25/24 10:40  Resp 10 04/25/24 10:40  SpO2 87 % 04/25/24 10:40  Vitals shown include unfiled device data.  Last Pain:  Vitals:   04/25/24 9391  TempSrc:   PainSc: 0-No pain      Patients Stated Pain Goal: 0 (04/25/24 9391)  Complications: No notable events documented.

## 2024-04-25 NOTE — Op Note (Signed)
 Patient name: Annette Suarez MRN: [id number] DOB: 26-May-1941 Sex: female  04/25/2024 Pre-operative Diagnosis: Left subclavian steal syndrome Post-operative diagnosis:  Same Surgeon:  [name] Assistants:  Lucie Apt, PA Procedure:   left carotid to left subclavian bypass with 6mn dacryon graft Anesthesia:  General Blood Loss:  minimal Specimens:  none  Findings: The phrenic nerve was fully mobilized on top of the anterior scalene.  It ultimately was situated on the lateral aspect of the dacryon graft bypass coming off of the subclavian artery.  Indications: This is an 83 year old female with left subclavian artery occlusion.  She is having syncopal type episodes and tingling in her left hand.  She was referred to neurology to confirm that this was vertebrobasilar insufficiency.  They felt that it was not so we discussed proceeding with a left carotid subclavian bypass graft.  Procedure:  The patient was identified in the holding area and taken to [facility] OR ROOM 11  The patient was then placed supine on the table. general anesthesia was administered.  The patient was prepped and draped in the usual sterile fashion.  A time out was called and antibiotics were administered.  Due to the complexity of the procedure, an experienced assistant was necessary.  She helped with exposure by providing suction and retraction.  She helped with the anastomoses by following the suture.  A transverse incision was made 1 fingerbreadth below the clavicle beginning just lateral to the midline.  Cautery was used to divide the subcutaneous tissue and platysma muscle.  Identified the 2 heads of the sternocleidomastoid and developed an avascular plane between them.  I first dissected out the internal jugular vein.  I then dissected out the common carotid artery which was disease-free.  The vagus nerve was visualized and protected posterior to the artery and vein.  Next, I mobilized the scalene fat pad lateral to the  internal jugular vein.  I identified the thoracic duct and divided this between silk ties.  All lymphatics were ligated between silk ties.  Ultimately, I was able to identify the anterior scalene muscle.  The phrenic nerve was visualized coursing anteriorly from lateral to medial.  I had to fully skeletonized the phrenic nerve to mobilize it out of the way.  Once this was done the anterior scalene was divided with cautery.  This enabled me to expose the left subclavian artery which was disease-free.  There were several small branches off of the subclavian artery that were ligated.  There did appear to be 1 large proximal branch going cephalad.  This was protected.  Once the subclavian artery was fully mobilized, the patient was heparinized.  ACT levels were checked.  The left subclavian artery was then occluded with vascular clamps and a #11 blade was used to make an arteriotomy which was extended longitudinally with Potts scissors.  Based off of the small size of the subclavian artery, I elected to use a 6 mm dacryon graft.  This was beveled to fit the size of the arteriotomy.  A running anastomosis was performed with 6-0 Prolene.  Once this was completed, the clamps were released.  The anastomosis was hemostatic.  The phrenic nerve was gently placed lateral to the anastomosis however it abuts the graft near the artery.  The graft was then brought posterior to the internal jugular vein.  A site was selected on the posterior lateral side of the common carotid artery.  The common carotid artery was occluded.  A #11 blade was used  to make an arteriotomy.  This was extended with a #5 punch.  The graft was cut to the appropriate length and a end-to-side anastomosis was performed with running 6-0 Prolene.  Prior to completion, the appropriate flushing maneuvers were performed and the anastomosis was completed.  Hand-held Doppler was used to evaluate the signals in the carotid and subclavian artery which were excellent.   50 mg of protamine was used to reverse the heparin .  The wound was then irrigated.  There was no evidence of a chyle or lymphatic leak.  Surgiflo was placed in the incision.  I then reapproximated the platysma muscle with 3-0 Vicryl and closed the skin with 4-0 Vicryl and Dermabond.  There were no immediate complications.   Disposition: To PACU stable.   ALONSO [name], M.D., [facility] [place] Office: [phone] Pager:  [phone]

## 2024-04-25 NOTE — Anesthesia Procedure Notes (Signed)
 Arterial Line Insertion Start/End10/22/2025 7:00 AM Performed by: CRNA  Patient location: Pre-op. Preanesthetic checklist: patient identified, IV checked, site marked, risks and benefits discussed, surgical consent, monitors and equipment checked, pre-op evaluation, timeout performed and anesthesia consent Lidocaine  1% used for infiltration Right, radial was placed Catheter size: 20 G Hand hygiene performed  and maximum sterile barriers used   Attempts: 1 Procedure performed using ultrasound to evaluate access site. Ultrasound Notes:relevant anatomy identified, ultrasound used to visualize needle entry and vessel patent under ultrasound. Following insertion, dressing applied. Post procedure assessment: normal and unchanged

## 2024-04-25 NOTE — Interval H&P Note (Signed)
 History and Physical Interval Note:  04/25/2024 7:29 AM  Annette Suarez  has presented today for surgery, with the diagnosis of L subclavian artery occlusion.  The various methods of treatment have been discussed with the patient and family. After consideration of risks, benefits and other options for treatment, the patient has consented to  Procedure(s): CREATION, BYPASS, ARTERIAL, SUBCLAVIAN TO CAROTID, USING GRAFT (Left) as a surgical intervention.  The patient's history has been reviewed, patient examined, no change in status, stable for surgery.  I have reviewed the patient's chart and labs.  Questions were answered to the patient's satisfaction.     [name]

## 2024-04-25 NOTE — Anesthesia Procedure Notes (Addendum)
 Procedure Name: Intubation Date/Time: 04/25/2024 7:52 AM  Performed by: Boyce Shilling, CRNAPre-anesthesia Checklist: Patient identified, Emergency Drugs available, Suction available and Patient being monitored Patient Re-evaluated:Patient Re-evaluated prior to induction Oxygen Delivery Method: Circle system utilized Preoxygenation: Pre-oxygenation with 100% oxygen Induction Type: IV induction Ventilation: Mask ventilation without difficulty and Oral airway inserted - appropriate to patient size Laryngoscope Size: Mac and 3 Grade View: Grade I Tube type: Oral Tube size: 7.0 mm Number of attempts: 1 Airway Equipment and Method: Stylet and Oral airway Placement Confirmation: ETT inserted through vocal cords under direct vision, positive ETCO2 and breath sounds checked- equal and bilateral Secured at: 21 cm Tube secured with: Tape Dental Injury: Teeth and Oropharynx as per pre-operative assessment

## 2024-04-26 ENCOUNTER — Encounter (HOSPITAL_COMMUNITY): Payer: Self-pay | Admitting: Surgery

## 2024-04-26 LAB — BASIC METABOLIC PANEL WITH GFR
Anion gap: 11 (ref 5–15)
BUN: 31 mg/dL — ABNORMAL HIGH (ref 8–23)
CO2: 20 mmol/L — ABNORMAL LOW (ref 22–32)
Calcium: 7.9 mg/dL — ABNORMAL LOW (ref 8.9–10.3)
Chloride: 105 mmol/L (ref 98–111)
Creatinine, Ser: 1.42 mg/dL — ABNORMAL HIGH (ref 0.44–1.00)
GFR, Estimated: 37 mL/min — ABNORMAL LOW (ref >60)
Glucose, Bld: 221 mg/dL — ABNORMAL HIGH (ref 70–99)
Potassium: 4.4 mmol/L (ref 3.5–5.1)
Sodium: 136 mmol/L (ref 135–145)

## 2024-04-26 LAB — LIPID PANEL
Cholesterol: 95 mg/dL (ref 0–200)
HDL: 38 mg/dL — ABNORMAL LOW (ref >40)
LDL Cholesterol: 44 mg/dL (ref 0–99)
Total CHOL/HDL Ratio: 2.5 ratio
Triglycerides: 66 mg/dL (ref <150)
VLDL: 13 mg/dL (ref 0–40)

## 2024-04-26 LAB — CBC
HCT: 35.5 % — ABNORMAL LOW (ref 36.0–46.0)
Hemoglobin: 12.5 g/dL (ref 12.0–15.0)
MCH: 31.8 pg (ref 26.0–34.0)
MCHC: 35.2 g/dL (ref 30.0–36.0)
MCV: 90.3 fL (ref 80.0–100.0)
Platelets: 286 K/uL (ref 150–400)
RBC: 3.93 MIL/uL (ref 3.87–5.11)
RDW: 12.3 % (ref 11.5–15.5)
WBC: 17.6 K/uL — ABNORMAL HIGH (ref 4.0–10.5)
nRBC: 0 % (ref 0.0–0.2)

## 2024-04-26 LAB — GLUCOSE, CAPILLARY
Glucose-Capillary: 251 mg/dL — ABNORMAL HIGH (ref 70–99)
Glucose-Capillary: 236 mg/dL — ABNORMAL HIGH (ref 70–99)

## 2024-04-26 MED ORDER — OXYCODONE-ACETAMINOPHEN 5-325 MG PO TABS: 1.0000 | ORAL_TABLET | Freq: Four times a day (QID) | ORAL | 0 refills | Status: DC | PRN

## 2024-04-26 NOTE — Progress Notes (Addendum)
  Progress Note    04/26/2024 8:30 AM 1 Day Post-Op  Subjective:  no complaints. Breathing well.  Mild cough overnight.  Walked to the bathroom this morning.   Vitals:   04/26/24 0425 04/26/24 0500  BP: (!) 122/48 (!) 130/58  Pulse: 78 78  Resp: 15 16  Temp: 98.2 F (36.8 C)   SpO2: 94% 92%   Physical Exam: Lungs:  non labored on RA Incisions:  L neck/chest c/d/i Extremities:  palpable L radial; moving all ext well; symmetrical grip strength Neurologic: CN grossly intact  CBC    Component Value Date/Time   WBC 17.6 (H) 04/26/2024 0307   RBC 3.93 04/26/2024 0307   HGB 12.5 04/26/2024 0307   HGB 12.0 12/13/2023 0850   HCT 35.5 (L) 04/26/2024 0307   HCT 35.3 12/13/2023 0850   PLT 286 04/26/2024 0307   PLT 303 12/13/2023 0850   MCV 90.3 04/26/2024 0307   MCV 94 12/13/2023 0850   MCH 31.8 04/26/2024 0307   MCHC 35.2 04/26/2024 0307   RDW 12.3 04/26/2024 0307   RDW 12.0 12/13/2023 0850   LYMPHSABS 2,110 04/14/2023 0944   MONOABS 0.2 09/08/2017 1557   EOSABS 239 02/08/2024 1603   BASOSABS 91 02/08/2024 1603    BMET    Component Value Date/Time   NA 136 04/26/2024 0307   NA 139 12/13/2023 0850   K 4.4 04/26/2024 0307   CL 105 04/26/2024 0307   CO2 20 (L) 04/26/2024 0307   GLUCOSE 221 (H) 04/26/2024 0307   BUN 31 (H) 04/26/2024 0307   BUN 28 (H) 12/13/2023 0850   CREATININE 1.42 (H) 04/26/2024 0307   CREATININE 1.12 (H) 02/08/2024 1603   CALCIUM  7.9 (L) 04/26/2024 0307   GFRNONAA 37 (L) 04/26/2024 0307   GFRNONAA 37 (L) 01/09/2021 1337   GFRAA 43 (L) 01/09/2021 1337    INR    Component Value Date/Time   INR 1.1 04/20/2024 1230     Intake/Output Summary (Last 24 hours) at 04/26/2024 0830 Last data filed at 04/26/2024 0600 Gross per 24 hour  Intake 1680 ml  Output 425 ml  Net 1255 ml     Assessment/Plan:  83 y.o. female is s/p L carotid subclavian bypass 1 Day Post-Op   Subjectively feeling well since surgery.  The incision is well appearing  without hematoma.  She has not experienced any stroke like symptoms.  She is breathing well on room air.  She will ambulate with the mobility team this morning.  If walking well she can discharge this afternoon.  She will follow up in 2-3 weeks for incision check.   Donnice Sender, PA-C [facility] [phone] 04/26/2024 8:30 AM  I agree with the above.  I spent about the patient.  She is postoperative day #1 from a left carotid subclavian bypass graft.  She has a palpable left radial pulse.  She is neurologically intact.  I anticipate discharge later today  [name]

## 2024-04-26 NOTE — Progress Notes (Addendum)
 EOS  Pt reports still experience dizziness Up to [facility] x1 assist, unsteady A&Ox4, neuro intact, baseline, strange affect  --------------------  [facility] w/ walker x1 standby

## 2024-04-26 NOTE — Progress Notes (Signed)
 Mobility Specialist Progress Note:    04/26/24 1000  Mobility  Activity Ambulated with assistance  Level of Assistance Contact guard assist, steadying assist  Assistive Device Front wheel walker  Distance Ambulated (ft) 150 ft  Activity Response Tolerated well  Mobility Referral Yes  Mobility visit 1 Mobility  Mobility Specialist Start Time (ACUTE ONLY) 1000  Mobility Specialist Stop Time (ACUTE ONLY) 1009  Mobility Specialist Time Calculation (min) (ACUTE ONLY) 9 min   Pt received in chair, eager for mobility session. Ambulated in hallway with RW and MinG. Tolerated well, asx throughout. Returned to room, eager for d/c.   Annette Suarez Mobility Specialist Please contact via SecureChat or  Rehab office at [phone]

## 2024-04-26 NOTE — Anesthesia Postprocedure Evaluation (Signed)
 Anesthesia Post Note  Patient: Annette Suarez  Procedure(s) Performed: CREATION, BYPASS, ARTERIAL, SUBCLAVIAN TO CAROTID, USING X 30CM HEMASHIELD GOLD GRAFT (Left: Neck)     Patient location during evaluation: PACU Anesthesia Type: General Level of consciousness: awake Pain management: pain level controlled Vital Signs Assessment: post-procedure vital signs reviewed and stable Respiratory status: spontaneous breathing, nonlabored ventilation and respiratory function stable Cardiovascular status: blood pressure returned to baseline and stable Postop Assessment: no apparent nausea or vomiting Anesthetic complications: no   No notable events documented.  Last Vitals:  Vitals:   04/26/24 0425 04/26/24 0500  BP: (!) 122/48 (!) 130/58  Pulse: 78 78  Resp: 15 16  Temp: 36.8 C   SpO2: 94% 92%    Last Pain:  Vitals:   04/26/24 0425  TempSrc: Oral  PainSc:                  Bernardino SQUIBB Ridhi Hoffert

## 2024-04-26 NOTE — Plan of Care (Signed)

## 2024-04-26 NOTE — Progress Notes (Signed)
 PHARMACIST LIPID MONITORING   Annette Suarez is a 83 y.o. female admitted on 04/25/2024 with  L carotid subclavian bypass.  Pharmacy has been consulted to optimize lipid-lowering therapy with the indication of secondary prevention for clinical ASCVD.  Recent Labs:  Lipid Panel (last 6 months):   Lab Results  Component Value Date   CHOL 95 04/26/2024   TRIG 66 04/26/2024   HDL 38 (L) 04/26/2024   CHOLHDL 2.5 04/26/2024   VLDL 13 04/26/2024   LDLCALC 44 04/26/2024    Hepatic function panel (last 6 months):   Lab Results  Component Value Date   AST 15 04/20/2024   ALT 12 04/20/2024   ALKPHOS 87 04/20/2024   BILITOT 1.6 (H) 04/20/2024    SCr (since admission):   Serum creatinine: 1.42 mg/dL (H) 89/76/74 9692 Estimated creatinine clearance: 27.9 mL/min (A)  Current therapy and lipid therapy tolerance Current lipid-lowering therapy: simvastatin  20mg   Previous lipid-lowering therapies (if applicable): n/a Documented or reported allergies or intolerances to lipid-lowering therapies (if applicable): n/a  Assessment:   Patient agrees with changes to lipid-lowering therapy  Plan:    1.Statin intensity (high intensity recommended for all patients regardless of the LDL):  No statin changes. The patient is already on an appropriate statin.  2.Add ezetimibe (if any one of the following):   Not indicated at this time.  3.Refer to lipid clinic:   No  4.Follow-up with:  Primary care provider - Gil Greig BRAVO, NP  5.Follow-up labs after discharge:  No changes in lipid therapy, repeat a lipid panel in one year.     Thank you for allowing pharmacy to be a part of this patient's care.  Shelba Collier, PharmD, BCPS Clinical Pharmacist

## 2024-04-26 NOTE — TOC Transition Note (Signed)
 Transition of Care (TOC) - Discharge Note Rayfield Gobble RN, BSN Inpatient Care Management Unit 4E- RN Case Manager See Treatment Team for direct phone #   Patient Details  Name: Annette Suarez MRN: [id number] Date of Birth: [birth year]  Transition of Care [facility]) CM/SW Contact:  Gobble Rayfield Hurst, RN Phone Number: 04/26/2024, 1:12 PM   Clinical Narrative:    Pt stable for transition home today, CM notified by [facility] liaison that [facility] office made referral for any [facility] needs- liaison to follow up with pt and initiate office protocol if needed.   Family to transport home.  No further IP CM needs noted.    Final next level of care: Home w Home Health Services Barriers to Discharge: No Barriers Identified   Patient Goals and CMS Choice Patient states their goals for this hospitalization and ongoing recovery are:: return home   Choice offered to / list presented to : NA      Discharge Placement                 Home      Discharge Plan and Services Additional resources added to the After Visit Summary for     Discharge Planning Services: CM Consult Post Acute Care Choice:  ([facility] office referral)          DME Arranged: N/A DME Agency: NA         HH Agency: [facility] Date HH Agency Contacted: 04/26/24 Time HH Agency Contacted: 1311 Representative spoke with at [facility] Agency: [place]  Social Drivers of Health (SDOH) Interventions SDOH Screenings   Food Insecurity: No Food Insecurity (04/26/2024)  Housing: Low Risk  (04/26/2024)  Transportation Needs: No Transportation Needs (04/26/2024)  Utilities: Not At Risk (04/26/2024)  Alcohol Screen: Low Risk  (05/19/2023)  Depression (PHQ2-9): Low Risk  (07/18/2023)  Financial Resource Strain: Low Risk  (05/19/2023)  Physical Activity: Inactive (05/19/2023)  Social Connections: Moderately Integrated (04/26/2024)  Stress: No Stress Concern Present (05/19/2023)  Tobacco Use: Medium Risk (04/25/2024)   Health Literacy: Adequate Health Literacy (05/19/2023)     Readmission Risk Interventions    04/26/2024    1:12 PM  Readmission Risk Prevention Plan  Post Dischage Appt Complete  Medication Screening Complete  Transportation Screening Complete

## 2024-04-27 ENCOUNTER — Telehealth: Payer: Self-pay

## 2024-04-27 NOTE — Transitions of Care (Post Inpatient/ED Visit) (Signed)
   04/27/2024  Name: Annette Suarez MRN: [id number] DOB: 09-26-1940  Today's TOC FU Call Status: Today's TOC FU Call Status:: Unsuccessful Call (1st Attempt) Unsuccessful Call (1st Attempt) Date: 04/27/24  Attempted to reach the patient regarding the most recent Inpatient/ED visit.  Follow Up Plan: Additional outreach attempts will be made to reach the patient to complete the Transitions of Care (Post Inpatient/ED visit) call.   Shona Prow RN, CCM [place]  VBCI-Population Health RN Care Manager [phone]

## 2024-04-27 NOTE — Discharge Summary (Signed)
 Discharge Summary     Annette Suarez 1941-06-27 83 y.o. female  [id number]  Admission Date: 04/25/2024  Discharge Date: 04/26/24  Physician: Dr. Serene  Admission Diagnosis: Blockage of subclavian artery [I70.8] Left subclavian artery occlusion [I70.8] Stenosis of left subclavian artery [I77.1]  Discharge Day services:   See progress note 04/26/2024  Hospital Course:  [name] is a 83 year old female who is brought in as an outpatient and underwent left carotid subclavian bypass by Dr. Serene on 04/25/2024.  She tolerated the procedure well and was admitted to the hospital postoperatively.  Postoperative day #1 she was oxygenating well.  She maintained a palpable left radial pulse throughout her hospital stay.  She also remained neurologically intact without any neurologic events.  Her left neck incision was well-appearing and she was ready for discharge home.  She was able to walk with mobility team without complication.  She will follow-up in the office in 2 to 3 weeks for incision check.  She was prescribed 1 to 2 days of narcotic pain medication for continued postoperative pain control.  She was discharged home in stable condition.   Recent Labs    04/26/24 0307  NA 136  K 4.4  CL 105  CO2 20*  GLUCOSE 221*  BUN 31*  CALCIUM  7.9*   Recent Labs    04/26/24 0307  WBC 17.6*  HGB 12.5  HCT 35.5*  PLT 286   No results for input(s): INR in the last 72 hours.     Discharge Diagnosis:  Blockage of subclavian artery [I70.8] Left subclavian artery occlusion [I70.8] Stenosis of left subclavian artery [I77.1]  Secondary Diagnosis: Patient Active Problem List   Diagnosis Date Noted   Left subclavian artery occlusion 04/25/2024   Stenosis of left subclavian artery 04/25/2024   Type 2 diabetes mellitus with diabetic polyneuropathy, with long-term current use of insulin  (HCC) 08/29/2023   Type 2 diabetes mellitus with hyperglycemia, with long-term current  use of insulin  (HCC) 08/29/2023   Coronary artery disease of native artery of native heart with stable angina pectoris 01/27/2022   Hyperlipidemia associated with type 2 diabetes mellitus (HCC) 01/27/2022   Diabetes mellitus with coincident hypertension (HCC) 08/13/2021   Type 2 diabetes mellitus with stage 3a chronic kidney disease, with long-term current use of insulin  (HCC) 08/13/2021   Multinodular goiter 08/13/2021   Subclavian arterial stenosis 09/11/2020   Aortic atherosclerosis 09/11/2020   Alteration in blood pressure 06/11/2020   Old complex tear of medial meniscus of right knee    Old complex tear of lateral meniscus of right knee    Primary osteoarthritis of both feet 12/13/2017   Dehydration 09/09/2017   Acute lower UTI 09/09/2017   Nausea & vomiting 09/08/2017   Primary osteoarthritis of both hands 04/05/2017   Left carpal tunnel syndrome 11/15/2016   Rheumatoid arthritis of multiple sites with negative rheumatoid factor (HCC) 10/06/2016   High risk medication use 10/06/2016   Dyslipidemia 10/06/2016   Pain in both hands 09/08/2016   Hypertension associated with diabetes (HCC) 09/08/2016   Type 2 diabetes mellitus  09/08/2016   Gastroesophageal reflux disease without esophagitis 09/08/2016   Osteopenia of multiple sites 09/08/2016   History of anxiety 09/08/2016   Benign paroxysmal positional vertigo 09/08/2016   Sleep apnea 09/08/2016   Past Medical History:  Diagnosis Date   Acute cystitis    UTI   Anxiety    Arthritis    Benign positional vertigo    Coronary artery disease  Nonobstructive   Dermatitis    Diabetes mellitus without complication (HCC)    Family history of adverse reaction to anesthesia    pt reports her sister had adverse reaction to nerve block   GERD (gastroesophageal reflux disease)    Goiter, nontoxic, multinodular    Gout    H/O: hysterectomy    History of tobacco use    Hx laparoscopic cholecystectomy    Hypercholesterolemia     Hypertension    Incomplete bladder emptying    Insomnia    Nausea & vomiting 09/08/2017   Peripheral vascular disease    Postmenopausal atrophic vaginitis    RA (rheumatoid arthritis) (HCC)    Rectocele    Renal calculus    Skin cancer    Sleep apnea    Thyroid  condition    Type 2 diabetes mellitus (HCC)    Urolithiasis    Vaginal enterocele     Allergies as of 04/26/2024       Reactions   Latex Rash   Amaryl [glimepiride]    Don't Remember   Amlodipine     LE edema on 7.5mg  daily. Tolerates 5mg  daily just fine   Codeine    SICK   Jardiance [empagliflozin]    Urinary tract infection- SIDE EFFECT   Pneumovax 23 [pneumococcal Vac Polyvalent]    Red / Swelling / Hot to touch SIDE EFFECTS   Wellbutrin [bupropion]    Unknown        Medication List     TAKE these medications    Accu-Chek Guide Test test strip Generic drug: glucose blood Check blood sugar three times daily   Accu-Chek Softclix Lancets lancets Use to test blood sugar three times daily. Dx: E11.8   aspirin  EC 81 MG tablet Take 81 mg by mouth daily.   Biotin 1 MG Caps Take 1 mg by mouth daily.   Blink Tears 0.25 % Soln Generic drug: Polyethylene Glycol 400 Place 1 drop into both eyes at bedtime as needed (stratchy eyes).   carvedilol  12.5 MG tablet Commonly known as: COREG  Take 1 tablet (12.5 mg total) by mouth 2 (two) times daily.   chlorthalidone  25 MG tablet Commonly known as: HYGROTON  TAKE 1 TABLET BY MOUTH DAILY   clobetasol cream 0.05 % Commonly known as: TEMOVATE Apply 1 Application topically daily as needed (Rash).   diazepam  5 MG tablet Commonly known as: VALIUM  Take 5 mg by mouth every 6 (six) hours as needed for anxiety.   Droplet Pen Needles 31G X 5 MM Misc Generic drug: Insulin  Pen Needle USE TO ADMINISTER INSULIN  4 TIMES DAILY   Enbrel  SureClick 50 MG/ML injection Generic drug: etanercept  Inject 50 mg into the skin once a week.   estradiol  0.1 MG/GM vaginal  cream Commonly known as: ESTRACE  Place 1 Applicatorful vaginally 3 (three) times a week.   FreeStyle Libre 3 Plus Sensor Misc 1 Device by Other route every 14 (fourteen) days. Change sensor every 15 days.   FreeStyle Libre 3 Reader Devi 1 Device by Does not apply route daily in the afternoon.   insulin  lispro 100 UNIT/ML KwikPen Commonly known as: HumaLOG  KwikPen Start CF: Humalog  (BG-130/35) TIDQAC and at bedtime  Max daily 30 units What changed:  how much to take how to take this when to take this additional instructions   irbesartan  300 MG tablet Commonly known as: AVAPRO  TAKE 1 TABLET BY MOUTH DAILY   loperamide 2 MG tablet Commonly known as: IMODIUM A-D Take 2 mg by mouth daily as  needed for diarrhea or loose stools.   LORazepam  0.5 MG tablet Commonly known as: ATIVAN  TAKE 1 TABLET BY MOUTH AT BEDTIME   MAGNESIUM CITRATE PO Take 250 mg by mouth daily as needed (leg cramps).   methenamine 1 g tablet Commonly known as: HIPREX Take 1 g by mouth 2 (two) times daily with a meal.   nitroGLYCERIN  0.4 MG SL tablet Commonly known as: NITROSTAT  Place 1 tablet (0.4 mg total) under the tongue every 5 (five) minutes as needed for chest pain.   omeprazole 20 MG Tbdd disintegrating tablet Take 20 mg by mouth daily.   ondansetron  4 MG tablet Commonly known as: ZOFRAN  TAKE 1 TABLET BY MOUTH 3 TIMES A DAY AS NEEDED FOR NAUSEA AND/OR VOMITING   oxyCODONE-acetaminophen  5-325 MG tablet Commonly known as: PERCOCET/ROXICET Take 1 tablet by mouth every 6 (six) hours as needed for moderate pain (pain score 4-6).   Ozempic  (0.25 or 0.5 MG/DOSE) 2 MG/1.5ML Sopn Generic drug: Semaglutide (0.25 or 0.5MG /DOS) Inject 0.5 mg into the skin once a week.   PROBIOTIC PO Take 1 tablet by mouth daily.   simvastatin  20 MG tablet Commonly known as: ZOCOR  TAKE 1 TABLET BY MOUTH EVERY EVENING   Tresiba  FlexTouch 200 UNIT/ML FlexTouch Pen Generic drug: insulin  degludec INJECT UNDER THE  SKIN 60 UNITS DAILY IN THE AFTERNOON   Vitamin D3 50 MCG (2000 UT) capsule Take 2,000 Units by mouth daily.   VOLTAREN EX Apply 1 application  topically 3 (three) times daily as needed (Arthritis).         Discharge Instructions:   [facility] Discharge Instructions Carotid Endarterectomy (CEA)  Please refer to the following instructions for your post-procedure care. Your surgeon or physician assistant will discuss any changes with you.  Activity  You are encouraged to walk as much as you can. You can slowly return to normal activities but must avoid strenuous activity and heavy lifting until your doctor tell you it's OK. Avoid activities such as vacuuming or swinging a golf club. You can drive after one week if you are comfortable and you are no longer taking prescription pain medications. It is normal to feel tired for serval weeks after your surgery. It is also normal to have difficulty with sleep habits, eating, and bowel movements after surgery. These will go away with time.  Bathing/Showering  You may shower after you come home. Do not soak in a bathtub, hot tub, or swim until the incision heals completely.  Incision Care  Shower every day. Clean your incision with mild soap and water. Pat the area dry with a clean towel. You do not need a bandage unless otherwise instructed. Do not apply any ointments or creams to your incision. You may have skin glue on your incision. Do not peel it off. It will come off on its own in about one week. Your incision may feel thickened and raised for several weeks after your surgery. This is normal and the skin will soften over time. For Men Only: It's OK to shave around the incision but do not shave the incision itself for 2 weeks. It is common to have numbness under your chin that could last for several months.  Diet  Resume your normal diet. There are no special food restrictions following this procedure. A  low fat/low cholesterol diet is recommended for all patients with vascular disease. In order to heal from your surgery, it is CRITICAL to get adequate nutrition. Your body requires vitamins,  minerals, and protein. Vegetables are the best source of vitamins and minerals. Vegetables also provide the perfect balance of protein. Processed food has little nutritional value, so try to avoid this.  Medications  Resume taking all of your medications unless your doctor or physician assistant tells you not to.  If your incision is causing pain, you may take over-the- counter pain relievers such as acetaminophen  (Tylenol ). If you were prescribed a stronger pain medication, please be aware these medications can cause nausea and constipation.  Prevent nausea by taking the medication with a snack or meal. Avoid constipation by drinking plenty of fluids and eating foods with a high amount of fiber, such as fruits, vegetables, and grains. Do not take Tylenol  if you are taking prescription pain medications.  Follow Up  Our office will schedule a follow up appointment 2-3 weeks following discharge.  Please call us  immediately for any of the following conditions  Increased pain, redness, drainage (pus) from your incision site. Fever of 101 degrees or higher. If you should develop stroke (slurred speech, difficulty swallowing, weakness on one side of your body, loss of vision) you should call 911 and go to the nearest emergency room.  Reduce your risk of vascular disease:  Stop smoking. If you would like help call [facility] at 1-800-QUIT-NOW ((660)069-6524) or [place] at [phone]. Manage your cholesterol Maintain a desired weight Control your diabetes Keep your blood pressure down  If you have any questions, please call the office at [phone].   Disposition: home  Patient's condition: is good  Follow up: [facility] in 2 weeks.   Donnice Sender, PA-C [facility] [phone]   --- For [facility] Registry use ---   Modified Rankin score at D/C (0-6): 0  IV medication needed for:  1. Hypertension: No 2. Hypotension: No  Post-op Complications: No  1. Post-op CVA or TIA: No  If yes: Event classification (right eye, left eye, right cortical, left cortical, verterobasilar, other):   If yes: Timing of event (intra-op, <6 hrs post-op, >=6 hrs post-op, unknown):   2. CN injury: No  If yes: CN  injuried   3. Myocardial infarction: No  If yes: Dx by (EKG or clinical, Troponin):   4.  CHF: No  5.  Dysrhythmia (new): No  6. Wound infection: No  7. Reperfusion symptoms: No  8. Return to OR: No  If yes: return to OR for (bleeding, neurologic, other CEA incision, other):   Discharge medications: Statin use:  Yes ASA use:  Yes   Beta blocker use:  Yes ACE-Inhibitor use:  No  ARB use:  Yes CCB use: No P2Y12 Antagonist use: No, [ ]  Plavix, [ ]  Plasugrel, [ ]  Ticlopinine, [ ]  Ticagrelor, [ ]  Other, [ ]  No for medical reason, [ ]  Non-compliant, [ ]  Not-indicated Anti-coagulant use:  No, [ ]  Warfarin, [ ]  Rivaroxaban, [ ]  Dabigatran,

## 2024-04-30 ENCOUNTER — Telehealth: Payer: Self-pay

## 2024-04-30 NOTE — Transitions of Care (Post Inpatient/ED Visit) (Signed)
 04/30/2024  Name: Annette Suarez MRN: [id number] DOB: 12-16-1940  Today's TOC FU Call Status: Today's TOC FU Call Status:: Successful TOC FU Call Completed TOC FU Call Complete Date: 04/30/24 Patient's Name and Date of Birth confirmed.  Transition Care Management Follow-up Telephone Call How have you been since you were released from the hospital?: Better Any questions or concerns?: No  Items Reviewed: Did you receive and understand the discharge instructions provided?: Yes Medications obtained,verified, and reconciled?: Yes (Medications Reviewed) Any new allergies since your discharge?: No Dietary orders reviewed?: Yes Type of Diet Ordered:: Diabetic diet Do you have support at home?: Yes People in Home [RPT]: spouse Name of Support/Comfort Primary Source: lives in home with husband - [place]  Medications Reviewed Today: Medications Reviewed Today     Reviewed by Lauro Shona LABOR, RN (Registered Nurse) on 04/30/24 at 1647  Med List Status: <None>   Medication Order Taking? Sig Documenting Provider Last Dose Status Informant  Accu-Chek Softclix Lancets lancets [id number] Yes Use to test blood sugar three times daily. Dx: E11.8 Shamleffer, Ibtehal Jaralla, MD  Active   aspirin  EC 81 MG tablet [id number] Yes Take 81 mg by mouth daily. [provider]  Active Self  Biotin 1 MG CAPS [id number] Yes Take 1 mg by mouth daily. [provider]  Active Self  carvedilol  (COREG ) 12.5 MG tablet [id number] Yes Take 1 tablet (12.5 mg total) by mouth 2 (two) times daily. Santo Stanly LABOR, MD  Active Self  chlorthalidone  (HYGROTON ) 25 MG tablet [id number] Yes TAKE 1 TABLET BY MOUTH DAILY Chandrasekhar, Mahesh A, MD  Active Self  Cholecalciferol (VITAMIN D3) 50 MCG (2000 UT) capsule [id number] Yes Take 2,000 Units by mouth daily. [provider]  Active Self  clobetasol cream (TEMOVATE) 0.05 % [id number] Yes Apply 1 Application topically daily as needed (Rash).  [provider]  Active Self  Continuous Glucose Receiver (FREESTYLE LIBRE 3 READER) DEVI [id number] Yes 1 Device by Does not apply route daily in the afternoon. Shamleffer, Donell Cardinal, MD  Active Self  Continuous Glucose Sensor (FREESTYLE LIBRE 3 PLUS SENSOR) MISC [id number] Yes 1 Device by Other route every 14 (fourteen) days. Change sensor every 15 days. Shamleffer, Donell Cardinal, MD  Active Self  diazepam  (VALIUM ) 5 MG tablet [id number]  Take 5 mg by mouth every 6 (six) hours as needed for anxiety.  Patient not taking: Reported on 04/30/2024   [provider]  Consider Medication Status and Discontinue (Patient Preference) Self  Diclofenac Sodium (VOLTAREN EX) [id number] Yes Apply 1 application  topically 3 (three) times daily as needed (Arthritis). [provider]  Active Self  estradiol  (ESTRACE ) 0.1 MG/GM vaginal cream [id number] Yes Place 1 Applicatorful vaginally 3 (three) times a week. Ngetich, Dinah C, NP  Active Self  etanercept  (ENBREL  SURECLICK) 50 MG/ML injection [id number] Yes Inject 50 mg into the skin once a week. Dolphus Reiter, MD  Active Self  glucose blood (ACCU-CHEK GUIDE TEST) test strip [id number] Yes Check blood sugar three times daily Shamleffer, Ibtehal Jaralla, MD  Active   insulin  degludec (TRESIBA  FLEXTOUCH) 200 UNIT/ML FlexTouch Pen [id number] Yes INJECT UNDER THE SKIN 60 UNITS DAILY IN THE AFTERNOON  Patient taking differently: 60 Units daily.   Shamleffer, Ibtehal Jaralla, MD  Active Self  insulin  lispro (HUMALOG  KWIKPEN) 100 UNIT/ML KwikPen [id number] Yes Start CF: Humalog  (BG-130/35) TIDQAC and at bedtime  Max daily 30 units  Patient taking differently: Inject 6 Units into the skin 3 (three) times daily before meals.  Plus in addition Less than 165=0 Units 166-200=1 Units 201-235=2 Units 236-270=3 Units 271-305=4 Units 306-340=5 Units 341-375=6 Units 376-410=7 Units 411-415=8 Units   Shamleffer, Donell Cardinal, MD  Active Self   Insulin  Pen Needle (DROPLET PEN NEEDLES) 31G X 5 MM MISC [id number] Yes USE TO ADMINISTER INSULIN  4 TIMES DAILY Shamleffer, Ibtehal Jaralla, MD  Active Self  irbesartan  (AVAPRO ) 300 MG tablet [id number] Yes TAKE 1 TABLET BY MOUTH DAILY Arida, Muhammad A, MD  Active Self  loperamide (IMODIUM A-D) 2 MG tablet [id number] Yes Take 2 mg by mouth daily as needed for diarrhea or loose stools. [provider]  Active Self  LORazepam  (ATIVAN ) 0.5 MG tablet [id number] Yes TAKE 1 TABLET BY MOUTH AT BEDTIME Gil Greig BRAVO, NP  Active Self  MAGNESIUM CITRATE PO [id number] Yes Take 250 mg by mouth daily as needed (leg cramps). [provider]  Active Self  methenamine (HIPREX) 1 g tablet [id number] Yes Take 1 g by mouth 2 (two) times daily with a meal. [provider]  Active Self  nitroGLYCERIN  (NITROSTAT ) 0.4 MG SL tablet [id number]  Place 1 tablet (0.4 mg total) under the tongue every 5 (five) minutes as needed for chest pain.  Patient not taking: Reported on 04/30/2024   Claudene Victory ORN, MD  Active Self           Med Note MARC, LANETA HERO   Wed Apr 25, 2024  6:13 AM) Has not used in over a year  Omeprazole 20 MG TBDD [id number] Yes Take 20 mg by mouth daily. [provider]  Active Self  ondansetron  (ZOFRAN ) 4 MG tablet [id number] Yes TAKE 1 TABLET BY MOUTH 3 TIMES A DAY AS NEEDED FOR NAUSEA AND/OR VOMITING Fargo, Amy E, NP  Active Self  oxyCODONE-acetaminophen  (PERCOCET/ROXICET) 5-325 MG tablet [id number]  Take 1 tablet by mouth every 6 (six) hours as needed for moderate pain (pain score 4-6).  Patient not taking: Reported on 04/30/2024   Bethanie Cough, PA-C  Active   Polyethylene Glycol 400 (BLINK TEARS) 0.25 % SOLN [id number] Yes Place 1 drop into both eyes at bedtime as needed (stratchy eyes). [provider]  Active Self  Probiotic Product (PROBIOTIC PO) [id number] Yes Take 1 tablet by mouth daily. [provider]  Active Self  Semaglutide ,0.25 or 0.5MG /DOS,  (OZEMPIC , 0.25 OR 0.5 MG/DOSE,) 2 MG/1.5ML SOPN [id number] Yes Inject 0.5 mg into the skin once a week. Shamleffer, Donell Cardinal, MD  Active Self  simvastatin  (ZOCOR ) 20 MG tablet [id number] Yes TAKE 1 TABLET BY MOUTH EVERY EVENING Fargo, Amy E, NP  Active Self            Home Care and Equipment/Supplies: Were Home Health Services Ordered?: Yes Name of Home Health Agency:: [facility] - ordered but patient declined Has Agency set up a time to come to your home?: No (see above) Any new equipment or medical supplies ordered?: No  Functional Questionnaire: Do you need assistance with bathing/showering or dressing?: Yes Do you need assistance with meal preparation?: Yes (patient states she has people cooking/bringing food- older son helps) Do you need assistance with eating?: No Do you have difficulty maintaining continence: No Do you need assistance with getting out of bed/getting out of a chair/moving?: No Do you have difficulty managing or taking your medications?: No  Follow up appointments reviewed: PCP Follow-up appointment confirmed?: Yes Date of PCP follow-up appointment?: 05/03/24 Follow-up Provider: Greig Gil, NP Specialist Hospital Follow-up appointment confirmed?: Yes Date of Specialist follow-up appointment?: 05/28/24 Follow-Up  Specialty Provider:: Vein & Vascular Dr Serene Do you need transportation to your follow-up appointment?: No Do you understand care options if your condition(s) worsen?: Yes-patient verbalized understanding  SDOH Interventions Today    Flowsheet Row Most Recent Value  SDOH Interventions   Food Insecurity Interventions Intervention Not Indicated  Housing Interventions Intervention Not Indicated  Transportation Interventions Intervention Not Indicated  Utilities Interventions Intervention Not Indicated    Goals Addressed             This Visit's Progress    [facility]I Transitions of Care (TOC) Care Plan       Problems:  Recent  Hospitalization for treatment of left subclavian artery occlusion/ left carotid to left subclavian bypass with 6mn dacryon graft   Patient reporting sugar at time of call via Freestyle Libre 253 and chart review shows A1C 01/12/24 8.3 - requires ongoing education   Goal:  Over the next 30 days, the patient will not experience hospital readmission  Interventions:  Transitions of Care: Doctor Visits  - discussed the importance of doctor visits Contacted provider for patient needs - conference call to vein and vascular to schedule follow up with surgeon- scheduled 05/28/24 Arranged PCP follow-up within 7 days (Care Guide Scheduled) Post-op wound/incision care reviewed with patient/caregiver Reviewed Signs and symptoms of infection Patient was unaware of home health - states she has not heard from them - conference call was placed to [facility] and patient was advised that they spoke with husband who declined services   Diabetes Interventions: Assessed patient's understanding of A1c goal: <7% Provided education to patient about basic DM disease process Reviewed medications with patient and discussed importance of medication adherence Discussed plans with patient for ongoing care management follow up and provided patient with direct contact information for care management team Lab Results  Component Value Date   HGBA1C 8.3 (H) 01/12/2024    Surgery (left carotid to left subclavian bypass with 6mn dacryon graft  ): Evaluation of current treatment plan related to left carotid to left subclavian bypass with 6mn dacryon graft surgery reviewed post-operative instructions with patient/caregiver addressed questions about post - surgical incision care  reviewed medications with patient and addressed questions  Patient Self Care Activities:  Attend all scheduled provider appointments Call pharmacy for medication refills 3-7 days in advance of running out of medications Call provider office for new  concerns or questions  Notify RN Care Manager of TOC call rescheduling needs Participate in Transition of Care Program/Attend TOC scheduled calls Take medications as prescribed   check feet daily for cuts, sores or redness take the blood sugar log to all doctor visits manage portion size wash and dry feet carefully every day wear comfortable, cotton socks wear comfortable, well-fitting shoes  Plan:  Telephone follow up appointment with care management team member scheduled for:  11/3/25am The patient has been provided with contact information for the care management team and has been advised to call with any health related questions or concerns.         Shona Prow RN, CCM [place]  [facility]Population Health RN Care Manager [phone]

## 2024-04-30 NOTE — Progress Notes (Signed)
 Annette Suarez                                          MRN: [id number]   04/30/2024   The [facility]BCI Quality Team Specialist reviewed this patient medical record for the purposes of chart review for care gap closure. The following were reviewed: chart review for care gap closure-kidney health evaluation for diabetes:eGFR  and uACR.    VBCI Quality Team

## 2024-05-03 ENCOUNTER — Encounter: Payer: Self-pay | Admitting: Orthopedic Surgery

## 2024-05-03 ENCOUNTER — Ambulatory Visit (INDEPENDENT_AMBULATORY_CARE_PROVIDER_SITE_OTHER): Admitting: Orthopedic Surgery

## 2024-05-03 VITALS — BP 138/80 | HR 84 | Temp 97.9°F | Ht 61.0 in | Wt 156.2 lb

## 2024-05-03 DIAGNOSIS — Z794 Long term (current) use of insulin: Secondary | ICD-10-CM

## 2024-05-03 DIAGNOSIS — Z23 Encounter for immunization: Secondary | ICD-10-CM

## 2024-05-03 DIAGNOSIS — I708 Atherosclerosis of other arteries: Secondary | ICD-10-CM | POA: Diagnosis not present

## 2024-05-03 DIAGNOSIS — I25118 Atherosclerotic heart disease of native coronary artery with other forms of angina pectoris: Secondary | ICD-10-CM | POA: Diagnosis not present

## 2024-05-03 DIAGNOSIS — E1165 Type 2 diabetes mellitus with hyperglycemia: Secondary | ICD-10-CM

## 2024-05-03 MED ORDER — NITROGLYCERIN 0.4 MG SL SUBL: 0.4000 mg | SUBLINGUAL_TABLET | SUBLINGUAL | 3 refills | Status: AC | PRN

## 2024-05-03 MED ORDER — TRESIBA FLEXTOUCH 200 UNIT/ML ~~LOC~~ SOPN: 60.0000 [IU] | PEN_INJECTOR | SUBCUTANEOUS | Status: DC

## 2024-05-03 NOTE — Patient Instructions (Addendum)
 If sugars continue to be below 70 you can reduce Tresiba  by 2 units at a time OR try a carb snack before bedtime  Recommend getting flu vaccine within next week> you may get RSV 2-3 weeks after getting flu vaccine

## 2024-05-04 NOTE — Progress Notes (Signed)
 Careteam: Patient Care Team: Gil Greig BRAVO, NP as PCP - General (Adult Health Nurse Practitioner) Santo Stanly LABOR, MD as PCP - Cardiology (Cardiology) Cleotilde Sewer, OD (Optometry) Nicholaus Sherlyn CROME, NP as Nurse Practitioner Lauro Shona LABOR, RN as Registered Nurse  Seen by: Greig Gil, AGNP-C  PLACE OF SERVICE:  [facility]  Advanced Directive information    Allergies  Allergen Reactions   Latex Rash   Amaryl [Glimepiride]     Don't Remember   Amlodipine      LE edema on 7.5mg  daily. Tolerates 5mg  daily just fine   Codeine     SICK   Jardiance [Empagliflozin]     Urinary tract infection- SIDE EFFECT   Pneumovax 23 [Pneumococcal Vac Polyvalent]     Red / Swelling / Hot to touch SIDE EFFECTS   Wellbutrin [Bupropion]     Unknown    Chief Complaint  Patient presents with   Hospitalization Follow-up    Patient has concerns not being able to sleep all night and having no energy.      HPI: Patient is a 83 y.o. female seen today for f/u s/p hospitalization 10/22 due to blockage of left subclavian artery.   Discussed the use of AI scribe software for clinical note transcription with the patient, who gave verbal consent to proceed.  History of Present Illness   Annette Suarez is an 83 year old female with diabetes who presents for follow-up after subclavian artery bypass surgery.  She recently underwent bypass surgery for an occluded left subclavian artery, which involved the placement of a graft. Initially, a stent was considered in July, but the artery was completely blocked, necessitating the bypass surgery. No new medications were started post-procedure, and she is managing pain with Tylenol .   She experiences pain and tenderness on the left upper chest, impacting her sleep. She finds it difficult to find a comfortable position due to the tenderness, and her best sleep is achieved while sitting. She takes lorazepam  nightly and Tylenol  as needed for pain. Oxycodone  was prescribed but is not taken due to adverse reactions to codeine.  Her blood sugar levels have been running low in the mornings, around 67-68 mg/dL. She continues to take 60 units of Tresiba  in the afternoons between 2 and 3 PM. She manages low blood sugar episodes by consuming juice and sitting up until levels normalize. She uses a sliding scale insulin  regimen with meals but not at bedtime.  Her appetite has decreased since the surgery, resulting in a weight loss of about two pounds over the past month. She reports difficulty eating hospital food but is doing better at home.  She has not yet received her flu shot. She plans to get next week.       Review of Systems:  Review of Systems  Constitutional: Negative.   HENT: Negative.    Respiratory:  Negative for shortness of breath.   Cardiovascular:  Positive for chest pain.  Gastrointestinal: Negative.   Genitourinary: Negative.   Musculoskeletal: Negative.   Neurological: Negative.   Endo/Heme/Allergies:  Negative for polydipsia.  Psychiatric/Behavioral: Negative.      Past Medical History:  Diagnosis Date   Acute cystitis    UTI   Anxiety    Arthritis    Benign positional vertigo    Coronary artery disease    Nonobstructive   Dermatitis    Diabetes mellitus without complication (HCC)    Family history of adverse reaction to anesthesia  pt reports her sister had adverse reaction to nerve block   GERD (gastroesophageal reflux disease)    Goiter, nontoxic, multinodular    Gout    H/O: hysterectomy    History of tobacco use    Hx laparoscopic cholecystectomy    Hypercholesterolemia    Hypertension    Incomplete bladder emptying    Insomnia    Nausea & vomiting 09/08/2017   Peripheral vascular disease    Postmenopausal atrophic vaginitis    RA (rheumatoid arthritis) (HCC)    Rectocele    Renal calculus    Skin cancer    Sleep apnea    Thyroid  condition    Type 2 diabetes mellitus (HCC)    Urolithiasis     Vaginal enterocele    Past Surgical History:  Procedure Laterality Date   AORTIC ARCH ANGIOGRAPHY N/A 10/22/2022   Procedure: AORTIC ARCH ANGIOGRAPHY;  Surgeon: Dann Candyce RAMAN, MD;  Location: [facility] INVASIVE CV LAB;  Service: Cardiovascular;  Laterality: N/A;   AORTIC ARCH ANGIOGRAPHY N/A 12/28/2023   Procedure: AORTIC ARCH ANGIOGRAPHY;  Surgeon: Darron Deatrice LABOR, MD;  Location: [facility] INVASIVE CV LAB;  Service: Cardiovascular;  Laterality: N/A;   CAROTID-SUBCLAVIAN BYPASS GRAFT Left 04/25/2024   Procedure: CREATION, BYPASS, ARTERIAL, SUBCLAVIAN TO CAROTID, USING X 30CM HEMASHIELD GOLD GRAFT;  Surgeon: Serene Gaile ORN, MD;  Location: [facility] OR;  Service: Vascular;  Laterality: Left;   CARPAL TUNNEL RELEASE  2018   CATARACT EXTRACTION, BILATERAL     CHOLECYSTECTOMY     COLONOSCOPY     07/11/2009, 02/2015   GALLBLADDER SURGERY     KNEE ARTHROSCOPY Right 05/20/2020   Procedure: RIGHT KNEE ARTHROSCOPY AND DEBRIDEMENT;  Surgeon: Harden Jerona GAILS, MD;  Location: [facility];  Service: Orthopedics;  Laterality: Right;   PARTIAL HYSTERECTOMY     Overies left intact   RIGHT/LEFT HEART CATH AND CORONARY ANGIOGRAPHY N/A 10/22/2022   Procedure: RIGHT/LEFT HEART CATH AND CORONARY ANGIOGRAPHY;  Surgeon: Dann Candyce RAMAN, MD;  Location: [facility] INVASIVE CV LAB;  Service: Cardiovascular;  Laterality: N/A;   SQUAMOUS CELL CARCINOMA EXCISION  05/2018   foot   Social History:   reports that she quit smoking about 45 years ago. Her smoking use included cigarettes. She started smoking about 55 years ago. She has a 2 pack-year smoking history. She has been exposed to tobacco smoke. She has never used smokeless tobacco. She reports that she does not drink alcohol and does not use drugs.  Family History  Problem Relation Age of Onset   Diabetes Mother    Heart disease Father 65   Diabetes Son    Crohn's disease Son     Medications: Patient's Medications  New Prescriptions   No medications on  file  Previous Medications   ACCU-CHEK SOFTCLIX LANCETS LANCETS    Use to test blood sugar three times daily. Dx: E11.8   ASPIRIN  EC 81 MG TABLET    Take 81 mg by mouth daily.   BIOTIN 1 MG CAPS    Take 1 mg by mouth daily.   CARVEDILOL  (COREG ) 12.5 MG TABLET    Take 1 tablet (12.5 mg total) by mouth 2 (two) times daily.   CHLORTHALIDONE  (HYGROTON ) 25 MG TABLET    TAKE 1 TABLET BY MOUTH DAILY   CHOLECALCIFEROL (VITAMIN D3) 50 MCG (2000 UT) CAPSULE    Take 2,000 Units by mouth daily.   CLOBETASOL CREAM (TEMOVATE) 0.05 %    Apply 1 Application topically daily as needed (Rash).  CONTINUOUS GLUCOSE RECEIVER (FREESTYLE LIBRE 3 READER) DEVI    1 Device by Does not apply route daily in the afternoon.   CONTINUOUS GLUCOSE SENSOR (FREESTYLE LIBRE 3 PLUS SENSOR) MISC    1 Device by Other route every 14 (fourteen) days. Change sensor every 15 days.   DICLOFENAC SODIUM (VOLTAREN EX)    Apply 1 application  topically 3 (three) times daily as needed (Arthritis).   ESTRADIOL  (ESTRACE ) 0.1 MG/GM VAGINAL CREAM    Place 1 Applicatorful vaginally 3 (three) times a week.   ETANERCEPT  (ENBREL  SURECLICK) 50 MG/ML INJECTION    Inject 50 mg into the skin once a week.   GLUCOSE BLOOD (ACCU-CHEK GUIDE TEST) TEST STRIP    Check blood sugar three times daily   INSULIN  LISPRO (HUMALOG  KWIKPEN) 100 UNIT/ML KWIKPEN    Start CF: Humalog  (BG-130/35) TIDQAC and at bedtime  Max daily 30 units   INSULIN  PEN NEEDLE (DROPLET PEN NEEDLES) 31G X 5 MM MISC    USE TO ADMINISTER INSULIN  4 TIMES DAILY   IRBESARTAN  (AVAPRO ) 300 MG TABLET    TAKE 1 TABLET BY MOUTH DAILY   LOPERAMIDE (IMODIUM A-D) 2 MG TABLET    Take 2 mg by mouth daily as needed for diarrhea or loose stools.   LORAZEPAM  (ATIVAN ) 0.5 MG TABLET    TAKE 1 TABLET BY MOUTH AT BEDTIME   MAGNESIUM CITRATE PO    Take 250 mg by mouth daily as needed (leg cramps).   METHENAMINE (HIPREX) 1 G TABLET    Take 1 g by mouth 2 (two) times daily with a meal.   OMEPRAZOLE 20 MG TBDD    Take  20 mg by mouth daily.   ONDANSETRON  (ZOFRAN ) 4 MG TABLET    TAKE 1 TABLET BY MOUTH 3 TIMES A DAY AS NEEDED FOR NAUSEA AND/OR VOMITING   POLYETHYLENE GLYCOL 400 (BLINK TEARS) 0.25 % SOLN    Place 1 drop into both eyes at bedtime as needed (stratchy eyes).   PROBIOTIC PRODUCT (PROBIOTIC PO)    Take 1 tablet by mouth daily.   SEMAGLUTIDE ,0.25 OR 0.5MG /DOS, (OZEMPIC , 0.25 OR 0.5 MG/DOSE,) 2 MG/1.5ML SOPN    Inject 0.5 mg into the skin once a week.   SIMVASTATIN  (ZOCOR ) 20 MG TABLET    TAKE 1 TABLET BY MOUTH EVERY EVENING  Modified Medications   Modified Medication Previous Medication   INSULIN  DEGLUDEC (TRESIBA  FLEXTOUCH) 200 UNIT/ML FLEXTOUCH PEN insulin  degludec (TRESIBA  FLEXTOUCH) 200 UNIT/ML FlexTouch Pen      Inject 60 Units into the skin daily.    INJECT UNDER THE SKIN 60 UNITS DAILY IN THE AFTERNOON   NITROGLYCERIN  (NITROSTAT ) 0.4 MG SL TABLET nitroGLYCERIN  (NITROSTAT ) 0.4 MG SL tablet      Place 1 tablet (0.4 mg total) under the tongue every 5 (five) minutes as needed for chest pain.    Place 1 tablet (0.4 mg total) under the tongue every 5 (five) minutes as needed for chest pain.  Discontinued Medications   DIAZEPAM  (VALIUM ) 5 MG TABLET    Take 5 mg by mouth every 6 (six) hours as needed for anxiety.   OXYCODONE-ACETAMINOPHEN  (PERCOCET/ROXICET) 5-325 MG TABLET    Take 1 tablet by mouth every 6 (six) hours as needed for moderate pain (pain score 4-6).    Physical Exam:  Vitals:   05/03/24 1501  BP: 138/80  Pulse: 84  Temp: 97.9 F (36.6 C)  SpO2: 95%  Weight: 156 lb 3.2 oz (70.9 kg)  Height: 5' 1 (1.549 m)  Body mass index is 29.51 kg/m. Wt Readings from Last 3 Encounters:  05/03/24 156 lb 3.2 oz (70.9 kg)  04/25/24 155 lb (70.3 kg)  04/09/24 159 lb (72.1 kg)    Physical Exam Vitals reviewed.  Constitutional:      General: She is not in acute distress.    Appearance: She is not ill-appearing.  HENT:     Head: Normocephalic.  Eyes:     General:        Right eye: No  discharge.        Left eye: No discharge.  Cardiovascular:     Rate and Rhythm: Normal rate and regular rhythm.     Pulses: Normal pulses.     Heart sounds: Normal heart sounds.  Pulmonary:     Effort: Pulmonary effort is normal.     Breath sounds: Normal breath sounds.  Abdominal:     General: Bowel sounds are normal. There is no distension.     Palpations: Abdomen is soft.     Tenderness: There is no abdominal tenderness.  Musculoskeletal:     Cervical back: Neck supple.     Right lower leg: No edema.     Left lower leg: No edema.  Skin:    General: Skin is warm.     Capillary Refill: Capillary refill takes less than 2 seconds.     Comments: Left upper chest/subclavian with surgical incision, closed with surgical glue, no sign of infection   Neurological:     General: No focal deficit present.     Mental Status: She is alert and oriented to person, place, and time.  Psychiatric:        Mood and Affect: Mood normal.     Labs reviewed: Basic Metabolic Panel: Recent Labs    08/18/23 1343 10/31/23 1103 02/08/24 1603 04/20/24 1230 04/26/24 0307  NA 139   < > 140 140 136  K 4.4   < > 4.1 3.8 4.4  CL 100   < > 99 101 105  CO2 24   < > 32 28 20*  GLUCOSE 198*   < > 86 137* 221*  BUN 24   < > 29* 27* 31*  CREATININE 1.18*   < > 1.12* 1.18* 1.42*  CALCIUM  9.4   < > 9.8 9.1 7.9*  TSH 2.16  --   --   --   --    < > = values in this interval not displayed.   Liver Function Tests: Recent Labs    08/18/23 1343 10/31/23 1103 04/20/24 1230  AST 13 11 15   ALT 10 9 12   ALKPHOS  --   --  87  BILITOT 1.2 1.2 1.6*  PROT 6.7 6.9 7.4  ALBUMIN  --   --  4.1   No results for input(s): LIPASE, AMYLASE in the last 8760 hours. No results for input(s): AMMONIA in the last 8760 hours. CBC: Recent Labs    08/18/23 1343 10/31/23 1103 12/13/23 0850 02/08/24 1603 04/20/24 1230 04/26/24 0307  WBC 11.3* 9.7   < > 11.4* 10.6* 17.6*  NEUTROABS 7,605 6,819  --  7,592  --    --   HGB 12.5 12.9   < > 12.5 12.5 12.5  HCT 36.5 39.1   < > 36.6 36.6 35.5*  MCV 93.1 94.9   < > 93.4 92.4 90.3  PLT 299 290   < > 283 304 286   < > = values in this interval not displayed.  Lipid Panel: Recent Labs    01/12/24 1223 04/26/24 0307  CHOL 111 95  HDL 37* 38*  LDLCALC 48 44  TRIG 153* 66  CHOLHDL 3.0 2.5   TSH: Recent Labs    08/18/23 1343  TSH 2.16   A1C: Lab Results  Component Value Date   HGBA1C 8.3 (H) 01/12/2024     Assessment/Plan 1. Left subclavian artery occlusion (Primary) - s/p left subclavian bypass 10/22 - pain controlled with tylenol   - surgical site no infection  - f/u with vascular 11/24  2. Coronary artery disease of native artery of native heart with stable angina pectoris - cont asa and simvastatin   3. Type 2 diabetes mellitus with hyperglycemia, with long-term current use of insulin  (HCC) - followed by endocrinology - sugars < 70 in AM - discussed reducing Tresiba  by 2 units or taking carb snack before bed  - cont Tresiba , semaglutide  and SSI  Total time: 34 minutes. Greater than 50% of total time spent doing patient education regarding health maintenance, left subclavian surgery, T2DM, CAD and hypoglycemia including symptom/medication management.   Next appt: 05/24/2024  Greig Gil BODILY  [facility] [phone]

## 2024-05-07 ENCOUNTER — Telehealth: Payer: Self-pay

## 2024-05-07 NOTE — Transitions of Care (Post Inpatient/ED Visit) (Signed)
 Transition of Care week 2  Visit Note  05/07/2024  Name: Annette Suarez MRN: [id number]          DOB: 1940/10/29  Situation: Patient enrolled in [facility] 30-day program. Visit completed with patient by telephone.   Background: Admit/Discharge Date: 10/22 - 10/23   Jolynn Pack Primary Diagnosis: left subclavian artery occlusion/ left carotid to left subclavian bypass   Initial Transition Care Management Follow-up Telephone Call Discharge Date and Diagnosis: 04/26/24, : left subclavian artery occlusion   Past Medical History:  Diagnosis Date   Acute cystitis    UTI   Anxiety    Arthritis    Benign positional vertigo    Coronary artery disease    Nonobstructive   Dermatitis    Diabetes mellitus without complication (HCC)    Family history of adverse reaction to anesthesia    pt reports her sister had adverse reaction to nerve block   GERD (gastroesophageal reflux disease)    Goiter, nontoxic, multinodular    Gout    H/O: hysterectomy    History of tobacco use    Hx laparoscopic cholecystectomy    Hypercholesterolemia    Hypertension    Incomplete bladder emptying    Insomnia    Nausea & vomiting 09/08/2017   Peripheral vascular disease    Postmenopausal atrophic vaginitis    RA (rheumatoid arthritis) (HCC)    Rectocele    Renal calculus    Skin cancer    Sleep apnea    Thyroid  condition    Type 2 diabetes mellitus (HCC)    Urolithiasis    Vaginal enterocele     Assessment: Patient Reported Symptoms: Cognitive Cognitive Status: No symptoms reported, Normal speech and language skills, Alert and oriented to person, place, and time      Neurological Neurological Review of Symptoms: No symptoms reported    HEENT HEENT Symptoms Reported: No symptoms reported      Cardiovascular Cardiovascular Symptoms Reported: No symptoms reported    Respiratory Respiratory Symptoms Reported: No symptoms reported Other Respiratory Symptoms: Patient states cough has completely  resolved    Endocrine Endocrine Symptoms Reported: Hypoglycemia Is patient diabetic?: Yes Is patient checking blood sugars at home?: Yes List most recent blood sugar readings, include date and time of day: Patient reports sugar today 137 after eating - patient reports she has had some lower sugar readings in the morning and discussed this with her PCP 05/03/24 and states she has been having a small carb snack in the evening as discussed and states her sugar has been better since. Endocrine Self-Management Outcome: 4 (good)  Gastrointestinal Gastrointestinal Symptoms Reported: No symptoms reported      Genitourinary Genitourinary Symptoms Reported: No symptoms reported    Integumentary Integumentary Symptoms Reported: Incision Additional Integumentary Details: patient states bruising is resolving and incision is healing Skin Self-Management Outcome: 4 (good) Skin Comment: Patient asked if she can put vitamin E on incision - TOC RN advised patient to discuss with surgeon's office before applying anything to incision  Musculoskeletal Musculoskelatal Symptoms Reviewed: No symptoms reported   Falls in the past year?: No Number of falls in past year: 1 or less Was there an injury with Fall?: No Fall Risk Category Calculator: 0 Patient Fall Risk Level: Low Fall Risk    Psychosocial           There were no vitals filed for this visit.  Medications Reviewed Today     Reviewed by Lauro Shona LABOR, RN (Registered Nurse) on  05/07/24 at 1101  Med List Status: <None>   Medication Order Taking? Sig Documenting Provider Last Dose Status Informant  Accu-Chek Softclix Lancets lancets [id number] Yes Use to test blood sugar three times daily. Dx: E11.8 Shamleffer, Ibtehal Jaralla, MD  Active   aspirin  EC 81 MG tablet [id number] Yes Take 81 mg by mouth daily. [provider]  Active Self  Biotin 1 MG CAPS [id number] Yes Take 1 mg by mouth daily. [provider]  Active Self   carvedilol  (COREG ) 12.5 MG tablet [id number] Yes Take 1 tablet (12.5 mg total) by mouth 2 (two) times daily. Santo Stanly LABOR, MD  Active Self  chlorthalidone  (HYGROTON ) 25 MG tablet [id number] Yes TAKE 1 TABLET BY MOUTH DAILY Chandrasekhar, Mahesh A, MD  Active Self  Cholecalciferol (VITAMIN D3) 50 MCG (2000 UT) capsule [id number] Yes Take 2,000 Units by mouth daily. [provider]  Active Self  clobetasol cream (TEMOVATE) 0.05 % [id number] Yes Apply 1 Application topically daily as needed (Rash). [provider]  Active Self  Continuous Glucose Receiver (FREESTYLE LIBRE 3 READER) DEVI [id number] Yes 1 Device by Does not apply route daily in the afternoon. Shamleffer, Donell Cardinal, MD  Active Self  Continuous Glucose Sensor (FREESTYLE LIBRE 3 PLUS SENSOR) MISC [id number] Yes 1 Device by Other route every 14 (fourteen) days. Change sensor every 15 days. Shamleffer, Donell Cardinal, MD  Active Self  Diclofenac Sodium (VOLTAREN EX) [id number] Yes Apply 1 application  topically 3 (three) times daily as needed (Arthritis). [provider]  Active Self  estradiol  (ESTRACE ) 0.1 MG/GM vaginal cream [id number] Yes Place 1 Applicatorful vaginally 3 (three) times a week. Ngetich, Dinah C, NP  Active Self  etanercept  (ENBREL  SURECLICK) 50 MG/ML injection [id number] Yes Inject 50 mg into the skin once a week. Dolphus Reiter, MD  Active Self  glucose blood (ACCU-CHEK GUIDE TEST) test strip [id number] Yes Check blood sugar three times daily Shamleffer, Ibtehal Jaralla, MD  Active   insulin  degludec (TRESIBA  FLEXTOUCH) 200 UNIT/ML FlexTouch Pen [id number] Yes Inject 60 Units into the skin daily. Gil No E, NP  Active   insulin  lispro (HUMALOG  KWIKPEN) 100 UNIT/ML KwikPen [id number] Yes Start CF: Humalog  (BG-130/35) TIDQAC and at bedtime  Max daily 30 units  Patient taking differently: Inject 6 Units into the skin 3 (three) times daily before meals. Plus in addition Less than 165=0  Units 166-200=1 Units 201-235=2 Units 236-270=3 Units 271-305=4 Units 306-340=5 Units 341-375=6 Units 376-410=7 Units 411-415=8 Units   Shamleffer, Donell Cardinal, MD  Active Self  Insulin  Pen Needle (DROPLET PEN NEEDLES) 31G X 5 MM MISC [id number] Yes USE TO ADMINISTER INSULIN  4 TIMES DAILY Shamleffer, Ibtehal Jaralla, MD  Active Self  irbesartan  (AVAPRO ) 300 MG tablet [id number] Yes TAKE 1 TABLET BY MOUTH DAILY Arida, Muhammad A, MD  Active Self  loperamide (IMODIUM A-D) 2 MG tablet [id number]  Take 2 mg by mouth daily as needed for diarrhea or loose stools.  Patient not taking: Reported on 05/07/2024   [provider]  Active Self  LORazepam  (ATIVAN ) 0.5 MG tablet [id number] Yes TAKE 1 TABLET BY MOUTH AT BEDTIME Gil No BRAVO, NP  Active Self  MAGNESIUM CITRATE PO [id number] Yes Take 250 mg by mouth daily as needed (leg cramps). [provider]  Active Self  methenamine (HIPREX) 1 g tablet [id number] Yes Take 1 g by mouth 2 (two) times daily with a meal. [provider]  Active Self  nitroGLYCERIN  (NITROSTAT ) 0.4 MG SL tablet [id number]  Place 1  tablet (0.4 mg total) under the tongue every 5 (five) minutes as needed for chest pain.  Patient not taking: Reported on 05/07/2024   Gil Greig BRAVO, NP  Active   Omeprazole 20 MG TBDD [id number] Yes Take 20 mg by mouth daily. [provider]  Active Self  ondansetron  (ZOFRAN ) 4 MG tablet [id number] Yes TAKE 1 TABLET BY MOUTH 3 TIMES A DAY AS NEEDED FOR NAUSEA AND/OR VOMITING Fargo, Amy E, NP  Active Self  Polyethylene Glycol 400 (BLINK TEARS) 0.25 % SOLN [id number] Yes Place 1 drop into both eyes at bedtime as needed (stratchy eyes). [provider]  Active Self  Probiotic Product (PROBIOTIC PO) [id number] Yes Take 1 tablet by mouth daily. [provider]  Active Self  Semaglutide ,0.25 or 0.5MG /DOS, (OZEMPIC , 0.25 OR 0.5 MG/DOSE,) 2 MG/1.5ML SOPN [id number] Yes Inject 0.5 mg into the skin once a week.  Shamleffer, Donell Cardinal, MD  Active Self  simvastatin  (ZOCOR ) 20 MG tablet [id number] Yes TAKE 1 TABLET BY MOUTH EVERY EVENING Fargo, Amy E, NP  Active Self            Recommendation:   Continue Current Plan of Care  Follow Up Plan:   Telephone follow up appointment date/time:  11/11/25am  Shona Prow RN, CCM [place]  [facility] RN Care Manager [phone]

## 2024-05-07 NOTE — Patient Instructions (Signed)
 Visit Information  Thank you for taking time to visit with me today. Please don't hesitate to contact me if I can be of assistance to you before our next scheduled telephone appointment.  Our next appointment is by telephone on 05/15/24 in the morning  Following is a copy of your care plan:   Goals Addressed             This Visit's Progress    VBCI Transitions of Care (TOC) Care Plan       Problems:  Recent Hospitalization for treatment of left subclavian artery occlusion/ left carotid to left subclavian bypass with 6mn dacryon graft   Patient reporting sugar at time of call via [employer] 253 and chart review shows A1C 01/12/24 8.3 - requires ongoing education - 05/07/24 reviewed with patient diabetes management and diet    Goal:  Over the next 30 days, the patient will not experience hospital readmission  Interventions:  Transitions of Care: Doctor Visits  - discussed the importance of doctor visits Contacted provider for patient needs - conference call to vein and vascular to schedule follow up with surgeon- scheduled 05/28/24 Arranged PCP follow-up within 7 days (Care Guide Scheduled) Post-op wound/incision care reviewed with patient/caregiver Reviewed Signs and symptoms of infection Patient was unaware of home health - states she has not heard from them - conference call was placed to [facility] and patient was advised that they spoke with husband who declined services  Update 05/07/24: Patient states she is doing well and incision is healing and saw her PCP 05/03/24 and plans to get flu vaccine this week and RSV in 2-3 weeks. Patient states her sugar this morning 137. TOC RN reviewed Diabetes management and plate method as well as low carbs - drink water - walking or using  her sitting step machine starting slowly and increasing as tolerated. Patient states she is not having any real pain but does take Tylenol  as needed before bed. Patient agreeable to ongoing follow  up  Diabetes Interventions: Assessed patient's understanding of A1c goal: <7% Provided education to patient about basic DM disease process Reviewed medications with patient and discussed importance of medication adherence Discussed plans with patient for ongoing care management follow up and provided patient with direct contact information for care management team Lab Results  Component Value Date   HGBA1C 8.3 (H) 01/12/2024    Surgery (left carotid to left subclavian bypass with 6mn dacryon graft  ): Evaluation of current treatment plan related to left carotid to left subclavian bypass with 6mn dacryon graft surgery reviewed post-operative instructions with patient/caregiver addressed questions about post - surgical incision care  reviewed medications with patient and addressed questions  Patient Self Care Activities:  Attend all scheduled provider appointments Call pharmacy for medication refills 3-7 days in advance of running out of medications Call provider office for new concerns or questions  Notify RN Care Manager of TOC call rescheduling needs Participate in Transition of Care Program/Attend TOC scheduled calls Take medications as prescribed   check feet daily for cuts, sores or redness take the blood sugar log to all doctor visits manage portion size wash and dry feet carefully every day wear comfortable, cotton socks wear comfortable, well-fitting shoes  Plan:  Telephone follow up appointment with care management team member scheduled for:  11/11/25am The patient has been provided with contact information for the care management team and has been advised to call with any health related questions or concerns.  Patient verbalizes understanding of instructions and care plan provided today and agrees to view in MyChart. Active MyChart status and patient understanding of how to access instructions and care plan via MyChart confirmed with patient.     Telephone  follow up appointment with care management team member scheduled for:  05/15/24 The patient has been provided with contact information for the care management team and has been advised to call with any health related questions or concerns.   Please call the care guide team at [phone] if you need to cancel or reschedule your appointment.   Please call the Suicide and Crisis Lifeline: 988 call 1-800-273-TALK (toll free, 24 hour hotline) call 911 if you are experiencing a Mental Health or Behavioral Health Crisis or need someone to talk to.  Shona Prow RN, CCM [place]  VBCI-Population Health RN Care Manager [phone]

## 2024-05-17 ENCOUNTER — Telehealth: Payer: Self-pay

## 2024-05-17 NOTE — Transitions of Care (Post Inpatient/ED Visit) (Signed)
  Transition of Care week 3  Visit Note  05/17/2024  Name: Annette Suarez MRN: [id number]          DOB: 1941/02/16  Situation: Patient enrolled in [facility] 30-day program. Visit completed with patient by telephone.   Background: Admit/Discharge Date: 10/22 - 10/23   Jolynn Pack Primary Diagnosis: left subclavian artery occlusion/ left carotid to left subclavian bypass with 6mn dacryon graft   Spoke with patient who states she has a lot going on and doesn't feel she needs to continue with the calls and wants to disenroll from [facility] Program   Initial Transition Care Management Follow-up Telephone Call Discharge Date and Diagnosis: 04/26/24, : left subclavian artery occlusion   Past Medical History:  Diagnosis Date   Acute cystitis    UTI   Anxiety    Arthritis    Benign positional vertigo    Coronary artery disease    Nonobstructive   Dermatitis    Diabetes mellitus without complication (HCC)    Family history of adverse reaction to anesthesia    pt reports her sister had adverse reaction to nerve block   GERD (gastroesophageal reflux disease)    Goiter, nontoxic, multinodular    Gout    H/O: hysterectomy    History of tobacco use    Hx laparoscopic cholecystectomy    Hypercholesterolemia    Hypertension    Incomplete bladder emptying    Insomnia    Nausea & vomiting 09/08/2017   Peripheral vascular disease    Postmenopausal atrophic vaginitis    RA (rheumatoid arthritis) (HCC)    Rectocele    Renal calculus    Skin cancer    Sleep apnea    Thyroid  condition    Type 2 diabetes mellitus (HCC)    Urolithiasis    Vaginal enterocele     Assessment: Unable to complete assessments or review medications related patient requested to disenroll from [facility] program    Patient will continue to follow up with her providers  Follow Up Plan:   Closing From:  Transitions of Care Program  Shona Prow RN, CCM [facility]  VBCI-Population Health RN Care Manager [phone]

## 2024-05-20 ENCOUNTER — Other Ambulatory Visit: Payer: Self-pay | Admitting: Internal Medicine

## 2024-05-22 NOTE — Progress Notes (Signed)
 POST OPERATIVE OFFICE NOTE    CC:  F/u for surgery  HPI:  This is a 83 y.o. female who is s/p left carotid to left subclavian bypass with 6mn dacryon graft on 04/25/24 by Dr. Serene.  This was for left subclavian steal. She was having syncopal type episodes and tingling in her left hand. She did very well post operatively and was discharged home POD#1.   Pt returns today for follow up.  Pt states dizziness and left arm tingling have resolved. She is feeling much better in regard to that. She is still having some generalized fatigue. Reports that if she goes out to like shop she is completely wiped out afterwards. She is unsure if this is related at all to her surgery. She does still get a little light headed if she bends over too quickly but she says she just tries to avoid doing that. Much better then it was before though as she explains that she use to barely lower her head and she would get light headed. She also feels that her balance is much better and she can walk straight now.  She is experiencing some sharp pain that starts at her clavicle and circles around her left shoulder. This happens intermittently but usually later in day. No aggravating factors. Resolves on its own after a while. Warm compress seems to help a little or she says she takes tylenol  if it is bothersome. She has known RA and OA and she says she is not sure if this is just related to the arthritis she has in her spine. She otherwise has had some generalized blurring of her vision but not amaurosis. No facial drooping, slurred speech or unilateral upper or lower extremity weakness or numbness. She is medically managed on Aspirin  and statin.   The pt is on a statin for cholesterol management.    The pt is on an aspirin .    Other AC:  none The pt is on ARB and BB for hypertension.  The pt does have diabetes. Tobacco hx:  former   Allergies  Allergen Reactions   Latex Rash   Amaryl [Glimepiride]     Don't Remember    Amlodipine      LE edema on 7.5mg  daily. Tolerates 5mg  daily just fine   Codeine     SICK   Jardiance [Empagliflozin]     Urinary tract infection- SIDE EFFECT   Pneumovax 23 [Pneumococcal Vac Polyvalent]     Red / Swelling / Hot to touch SIDE EFFECTS   Wellbutrin [Bupropion]     Unknown    Current Outpatient Medications  Medication Sig Dispense Refill   Accu-Chek Softclix Lancets lancets Use to test blood sugar three times daily. Dx: E11.8 300 each 1   aspirin  EC 81 MG tablet Take 81 mg by mouth daily.     Biotin 1 MG CAPS Take 1 mg by mouth daily.     carvedilol  (COREG ) 12.5 MG tablet Take 1 tablet (12.5 mg total) by mouth 2 (two) times daily. 180 tablet 3   chlorthalidone  (HYGROTON ) 25 MG tablet TAKE 1 TABLET BY MOUTH DAILY 90 tablet 3   Cholecalciferol (VITAMIN D3) 50 MCG (2000 UT) capsule Take 2,000 Units by mouth daily.     clobetasol  cream (TEMOVATE ) 0.05 % Apply 1 Application topically daily as needed (Rash).     Continuous Glucose Receiver (FREESTYLE LIBRE 3 READER) DEVI USE WITH LIBRE SENSORS 1 each 0   Continuous Glucose Sensor (FREESTYLE LIBRE 3 PLUS SENSOR)  MISC 1 Device by Other route every 14 (fourteen) days. Change sensor every 15 days. 6 each 3   Diclofenac Sodium (VOLTAREN EX) Apply 1 application  topically 3 (three) times daily as needed (Arthritis).     estradiol  (ESTRACE ) 0.1 MG/GM vaginal cream Place 1 Applicatorful vaginally 3 (three) times a week. 42.5 g 0   etanercept  (ENBREL  SURECLICK) 50 MG/ML injection Inject 50 mg into the skin once a week. 12 mL 0   glucose blood (ACCU-CHEK GUIDE TEST) test strip Check blood sugar three times daily 100 each 12   insulin  degludec (TRESIBA  FLEXTOUCH) 200 UNIT/ML FlexTouch Pen Inject 60 Units into the skin daily.     insulin  lispro (HUMALOG ) 100 UNIT/ML KwikPen INJECT UNDER THE SKIN PER SLIDING SCALE THREE TIMES A DAY WITH MEALS AND EVERY NIGHT AT BEDTIME **MAX 30 UNITS DAILY 9 mL 1   Insulin  Pen Needle (DROPLET PEN NEEDLES) 31G  X 5 MM MISC USE TO ADMINISTER INSULIN  4 TIMES DAILY 100 each 2   irbesartan  (AVAPRO ) 300 MG tablet TAKE 1 TABLET BY MOUTH DAILY 90 tablet 3   loperamide  (IMODIUM  A-D) 2 MG tablet Take 2 mg by mouth daily as needed for diarrhea or loose stools. (Patient not taking: Reported on 05/07/2024)     LORazepam  (ATIVAN ) 0.5 MG tablet TAKE 1 TABLET BY MOUTH AT BEDTIME 30 tablet 1   MAGNESIUM CITRATE PO Take 250 mg by mouth daily as needed (leg cramps).     methenamine  (HIPREX) 1 g tablet Take 1 g by mouth 2 (two) times daily with a meal.     nitroGLYCERIN  (NITROSTAT ) 0.4 MG SL tablet Place 1 tablet (0.4 mg total) under the tongue every 5 (five) minutes as needed for chest pain. (Patient not taking: Reported on 05/07/2024) 30 tablet 3   Omeprazole 20 MG TBDD Take 20 mg by mouth daily.     ondansetron  (ZOFRAN ) 4 MG tablet TAKE 1 TABLET BY MOUTH 3 TIMES A DAY AS NEEDED FOR NAUSEA AND/OR VOMITING 20 tablet 0   Polyethylene Glycol 400 (BLINK TEARS) 0.25 % SOLN Place 1 drop into both eyes at bedtime as needed (stratchy eyes).     Probiotic Product (PROBIOTIC PO) Take 1 tablet by mouth daily.     Semaglutide ,0.25 or 0.5MG /DOS, (OZEMPIC , 0.25 OR 0.5 MG/DOSE,) 2 MG/1.5ML SOPN Inject 0.5 mg into the skin once a week. 3 mL 0   simvastatin  (ZOCOR ) 20 MG tablet TAKE 1 TABLET BY MOUTH EVERY EVENING 90 tablet 1   No current facility-administered medications for this visit.     ROS:  See HPI  Physical Exam:  Vitals:   05/28/24 0958 05/28/24 1000  BP: (!) 164/84 (!) 164/81  Pulse: 86 91  Temp: 97.7 F (36.5 C)    General: WDWN, in no distress Lungs: non labored Heart: regular rate and rhythm Incision:  left neck incision is well healed Extremities:  moving all extremities without deficits. 2+ radial pulses. Hands warm and well perfused Neuro: alert and oriented   Assessment/Plan:  This is a 83 y.o. female who is s/p: left carotid to left subclavian bypass with 6mn dacryon graft on 04/25/24 by Dr. Serene.   This was for left subclavian steal. She was having syncopal type episodes and tingling in her left hand. Her left neck incision is well healed. She is not having any neurological symptoms. She is experiencing some vague pains and generalized weakness. I suspect the pain are nerve irritation and if surgery is at all related hopefully this  will improve with time. Her main symptoms she underwent surgery for are now resolved.  - Continue aspirin  and statin - I will have her follow up in 4-6 weeks with carotid duplex   Curry Damme, [facility] [phone]   Clinic MD:  Serene

## 2024-05-24 ENCOUNTER — Ambulatory Visit: Payer: Medicare Other | Admitting: Orthopedic Surgery

## 2024-05-24 ENCOUNTER — Encounter: Payer: Self-pay | Admitting: Orthopedic Surgery

## 2024-05-24 ENCOUNTER — Other Ambulatory Visit: Payer: Self-pay | Admitting: *Deleted

## 2024-05-24 VITALS — BP 128/74 | HR 85 | Temp 96.8°F | Resp 18 | Ht 61.0 in | Wt 154.6 lb

## 2024-05-24 DIAGNOSIS — Z0000 Encounter for general adult medical examination without abnormal findings: Secondary | ICD-10-CM | POA: Diagnosis not present

## 2024-05-24 DIAGNOSIS — E2839 Other primary ovarian failure: Secondary | ICD-10-CM | POA: Diagnosis not present

## 2024-05-24 DIAGNOSIS — Z79899 Other long term (current) drug therapy: Secondary | ICD-10-CM

## 2024-05-24 NOTE — Patient Instructions (Signed)
 Ms. Jacquet,  Thank you for taking the time for your Medicare Wellness Visit. I appreciate your continued commitment to your health goals. Please review the care plan we discussed, and feel free to reach out if I can assist you further.  Please note that Annual Wellness Visits do not include a physical exam. Some assessments may be limited, especially if the visit was conducted virtually. If needed, we may recommend an in-person follow-up with your provider.  Ongoing Care Seeing your primary care provider every 3 to 6 months helps us  monitor your health and provide consistent, personalized care.   Referrals If a referral was made during today's visit and you haven't received any updates within two weeks, please contact the referred provider directly to check on the status.  Recommended Screenings:  Health Maintenance  Topic Date Due   DTaP/Tdap/Td vaccine (3 - Td or Tdap) 05/02/2023   Yearly kidney health urinalysis for diabetes  04/13/2024   Flu Shot  10/02/2024*   Hemoglobin A1C  07/14/2024   Complete foot exam   08/28/2024   Eye exam for diabetics  09/18/2024   Yearly kidney function blood test for diabetes  04/26/2025   Medicare Annual Wellness Visit  05/24/2025   Pneumococcal Vaccine for age over 30  Completed   Osteoporosis screening with Bone Density Scan  Completed   Meningitis B Vaccine  Aged Out   COVID-19 Vaccine  Discontinued   Hepatitis C Screening  Discontinued   Zoster (Shingles) Vaccine  Discontinued  *Topic was postponed. The date shown is not the original due date.       05/24/2024   11:05 AM  Advanced Directives  Type of Advance Directive Living will;Healthcare Power of [employer] of Healthcare Power of Attorney in Chart? No - copy requested    Vision: Annual vision screenings are recommended for early detection of glaucoma, cataracts, and diabetic retinopathy. These exams can also reveal signs of chronic conditions such as diabetes and high blood  pressure.  Dental: Annual dental screenings help detect early signs of oral cancer, gum disease, and other conditions linked to overall health, including heart disease and diabetes.  Please see the attached documents for additional preventive care recommendations.   Consider getting Tdap (tetanus) at local pharmacy  Please schedule bone density

## 2024-05-24 NOTE — Progress Notes (Signed)
 Chief Complaint  Patient presents with   Medicare Wellness    MEDICARE AWV, SEQUENTIAL     Subjective:   Annette Suarez is a 83 y.o. female who presents for a Medicare Annual Wellness Visit.  Allergies (verified) Latex, Amaryl [glimepiride], Amlodipine , Codeine, Jardiance [empagliflozin], Pneumovax 23 [pneumococcal vac polyvalent], and Wellbutrin [bupropion]   History: Past Medical History:  Diagnosis Date   Acute cystitis    UTI   Anxiety    Arthritis    Benign positional vertigo    Coronary artery disease    Nonobstructive   Dermatitis    Diabetes mellitus without complication (HCC)    Family history of adverse reaction to anesthesia    pt reports her sister had adverse reaction to nerve block   GERD (gastroesophageal reflux disease)    Goiter, nontoxic, multinodular    Gout    H/O: hysterectomy    History of tobacco use    Hx laparoscopic cholecystectomy    Hypercholesterolemia    Hypertension    Incomplete bladder emptying    Insomnia    Nausea & vomiting 09/08/2017   Peripheral vascular disease    Postmenopausal atrophic vaginitis    RA (rheumatoid arthritis) (HCC)    Rectocele    Renal calculus    Skin cancer    Sleep apnea    Thyroid  condition    Type 2 diabetes mellitus (HCC)    Urolithiasis    Vaginal enterocele    Past Surgical History:  Procedure Laterality Date   AORTIC ARCH ANGIOGRAPHY N/A 10/22/2022   Procedure: AORTIC ARCH ANGIOGRAPHY;  Surgeon: Dann Candyce RAMAN, MD;  Location: [facility];  Service: Cardiovascular;  Laterality: N/A;   AORTIC ARCH ANGIOGRAPHY N/A 12/28/2023   Procedure: AORTIC ARCH ANGIOGRAPHY;  Surgeon: Darron Deatrice LABOR, MD;  Location: [facility];  Service: Cardiovascular;  Laterality: N/A;   CAROTID-SUBCLAVIAN BYPASS GRAFT Left 04/25/2024   Procedure: CREATION, BYPASS, ARTERIAL, SUBCLAVIAN TO CAROTID, USING X 30CM HEMASHIELD GOLD GRAFT;  Surgeon: Serene Gaile ORN, MD;  Location: [facility];  Service:  Vascular;  Laterality: Left;   CARPAL TUNNEL RELEASE  2018   CATARACT EXTRACTION, BILATERAL     CHOLECYSTECTOMY     COLONOSCOPY     07/11/2009, 02/2015   GALLBLADDER SURGERY     KNEE ARTHROSCOPY Right 05/20/2020   Procedure: RIGHT KNEE ARTHROSCOPY AND DEBRIDEMENT;  Surgeon: Harden Jerona GAILS, MD;  Location: [facility];  Service: Orthopedics;  Laterality: Right;   PARTIAL HYSTERECTOMY     [place] left intact   RIGHT/LEFT HEART CATH AND CORONARY ANGIOGRAPHY N/A 10/22/2022   Procedure: RIGHT/LEFT HEART CATH AND CORONARY ANGIOGRAPHY;  Surgeon: Dann Candyce RAMAN, MD;  Location: [facility];  Service: Cardiovascular;  Laterality: N/A;   SQUAMOUS CELL CARCINOMA EXCISION  05/2018   foot   Family History  Problem Relation Age of Onset   Diabetes Mother    Heart disease Father 92   Diabetes Son    Crohn's disease Son    Social History   Occupational History   Not on file  Tobacco Use   Smoking status: Former    Current packs/day: 0.00    Average packs/day: 0.2 packs/day for 10.0 years (2.0 ttl pk-yrs)    Types: Cigarettes    Start date: 07/05/1968    Quit date: 07/05/1978    Years since quitting: 45.9    Passive exposure: Past   Smokeless tobacco: Never  Vaping Use   Vaping  status: Never Used  Substance and Sexual Activity   Alcohol use: No   Drug use: No   Sexual activity: Not Currently   Tobacco Counseling Counseling given: Not Answered  SDOH Screenings   Food Insecurity: No Food Insecurity (04/30/2024)  Housing: Low Risk  (04/30/2024)  Transportation Needs: No Transportation Needs (04/30/2024)  Utilities: Not At Risk (04/30/2024)  Alcohol Screen: Low Risk  (05/19/2023)  Depression (PHQ2-9): Low Risk  (05/24/2024)  Financial Resource Strain: Low Risk  (05/19/2023)  Physical Activity: Inactive (05/19/2023)  Social Connections: Moderately Integrated (04/26/2024)  Stress: No Stress Concern Present (05/19/2023)  Tobacco Use: Medium Risk (05/24/2024)   Health Literacy: Adequate Health Literacy (05/19/2023)   See flowsheets for full screening details  Depression Screen PHQ 2 & 9 Depression Scale- Over the past 2 weeks, how often have you been bothered by any of the following problems? Little interest or pleasure in doing things: 0 Feeling down, depressed, or hopeless (PHQ Adolescent also includes...irritable): 0 PHQ-2 Total Score: 0     Goals Addressed   None    Visit info / Clinical Intake: Medicare Wellness Visit Type:: Subsequent Annual Wellness Visit Persons participating in visit:: patient Medicare Wellness Visit Mode:: In-person (required for WTM) Information given by:: patient Interpreter Needed?: No Pre-visit prep was completed: yes Living arrangements:: lives with spouse/significant other Patient's Overall Health Status Rating: good Typical amount of pain: none Does pain affect daily life?: no (does have fatigue) Are you currently prescribed opioids?: no  Dietary Habits and Nutritional Risks How many meals a day?: 3 Eats fruit and vegetables daily?: yes Most meals are obtained by: preparing own meals In the last 2 weeks, have you had any of the following?: (!) nausea, vomiting, diarrhea Diabetic:: (!) yes Any non-healing wounds?: no How often do you check your BS?: continuous glucose monitor; 4 Would you like to be referred to a Nutritionist or for Diabetic Management? : no  Functional Status Activities of Daily Living (to include ambulation/medication): Independent Ambulation: Independent with device- listed below Home Assistive Devices/Equipment: Cane Medication Administration: Independent Home Management: Independent  Fall Screening Falls in the past year?: 0 Number of falls in past year: 0 Was there an injury with Fall?: 0 Fall Risk Category Calculator: 0 Patient Fall Risk Level: Low Fall Risk  Fall Risk Patient at Risk for Falls Due to: History of fall(s) Fall risk Follow up: Falls evaluation  completed  Home and Transportation Safety: All rugs have non-skid backing?: yes All stairs or steps have railings?: yes Grab bars in the bathtub or shower?: yes Have non-skid surface in bathtub or shower?: yes Good home lighting?: yes Regular seat belt use?: yes Hospital stays in the last year:: (!) yes How many hospital stays:: 1 Reason: For the surgery  Cognitive Assessment Difficulty concentrating, remembering, or making decisions? : no What year is it?: 0 points What month is it?: 0 points Give patient an address phrase to remember (5 components): [address] [employer] About what time is it?: 0 points Count backwards from 20 to 1: 0 points Say the months of the year in reverse: 0 points Repeat the address phrase from earlier: 2 points (Patient said [address] [place], [place]) 6 CIT Score: 2 points  [employer] (For Healthcare) Does Patient Have a Medical Advance Directive?: Yes Does patient want to make changes to medical advance directive?: No - Patient declined Type of Advance Directive: Healthcare Power of [place]; Living will Copy of Healthcare Power of Attorney in  Chart?: No - copy requested Copy of Living Will in Chart?: No - copy requested Would patient like information on creating a medical advance directive?: No - Patient declined        Objective:    Today's Vitals   05/24/24 1044  BP: 128/74  Pulse: 85  Resp: 18  Temp: (!) 96.8 F (36 C)  SpO2: 97%  Weight: 154 lb 9.6 oz (70.1 kg)  Height: 5' 1 (1.549 m)  PainSc: 0-No pain   Body mass index is 29.21 kg/m.  Current Medications (verified) Outpatient Encounter Medications as of 05/24/2024  Medication Sig   Accu-Chek Softclix Lancets lancets Use to test blood sugar three times daily. Dx: E11.8   aspirin  EC 81 MG tablet Take 81 mg by mouth daily.   Biotin 1 MG CAPS Take 1 mg by mouth daily.   carvedilol  (COREG ) 12.5 MG tablet Take 1 tablet (12.5 mg total) by mouth 2 (two)  times daily.   chlorthalidone  (HYGROTON ) 25 MG tablet TAKE 1 TABLET BY MOUTH DAILY   Cholecalciferol (VITAMIN D3) 50 MCG (2000 UT) capsule Take 2,000 Units by mouth daily.   clobetasol  cream (TEMOVATE ) 0.05 % Apply 1 Application topically daily as needed (Rash).   Continuous Glucose Receiver (FREESTYLE LIBRE 3 READER) DEVI USE WITH LIBRE SENSORS   Continuous Glucose Sensor (FREESTYLE LIBRE 3 PLUS SENSOR) MISC 1 Device by Other route every 14 (fourteen) days. Change sensor every 15 days.   Diclofenac Sodium (VOLTAREN EX) Apply 1 application  topically 3 (three) times daily as needed (Arthritis).   estradiol  (ESTRACE ) 0.1 MG/GM vaginal cream Place 1 Applicatorful vaginally 3 (three) times a week.   etanercept  (ENBREL  SURECLICK) 50 MG/ML injection Inject 50 mg into the skin once a week.   glucose blood (ACCU-CHEK GUIDE TEST) test strip Check blood sugar three times daily   insulin  degludec (TRESIBA  FLEXTOUCH) 200 UNIT/ML FlexTouch Pen Inject 60 Units into the skin daily.   insulin  lispro (HUMALOG ) 100 UNIT/ML KwikPen INJECT UNDER THE SKIN PER SLIDING SCALE THREE TIMES A DAY WITH MEALS AND EVERY NIGHT AT BEDTIME **MAX 30 UNITS DAILY   Insulin  Pen Needle (DROPLET PEN NEEDLES) 31G X 5 MM MISC USE TO ADMINISTER INSULIN  4 TIMES DAILY   irbesartan  (AVAPRO ) 300 MG tablet TAKE 1 TABLET BY MOUTH DAILY   loperamide  (IMODIUM  A-D) 2 MG tablet Take 2 mg by mouth daily as needed for diarrhea or loose stools.   LORazepam  (ATIVAN ) 0.5 MG tablet TAKE 1 TABLET BY MOUTH AT BEDTIME   MAGNESIUM CITRATE PO Take 250 mg by mouth daily as needed (leg cramps).   methenamine  (HIPREX) 1 g tablet Take 1 g by mouth 2 (two) times daily with a meal.   nitroGLYCERIN  (NITROSTAT ) 0.4 MG SL tablet Place 1 tablet (0.4 mg total) under the tongue every 5 (five) minutes as needed for chest pain.   Omeprazole 20 MG TBDD Take 20 mg by mouth daily.   ondansetron  (ZOFRAN ) 4 MG tablet TAKE 1 TABLET BY MOUTH 3 TIMES A DAY AS NEEDED FOR NAUSEA  AND/OR VOMITING   Polyethylene Glycol 400 (BLINK TEARS) 0.25 % SOLN Place 1 drop into both eyes at bedtime as needed (stratchy eyes).   Probiotic Product (PROBIOTIC PO) Take 1 tablet by mouth daily.   Semaglutide ,0.25 or 0.5MG /DOS, (OZEMPIC , 0.25 OR 0.5 MG/DOSE,) 2 MG/1.5ML SOPN Inject 0.5 mg into the skin once a week.   simvastatin  (ZOCOR ) 20 MG tablet TAKE 1 TABLET BY MOUTH EVERY EVENING   No facility-administered encounter  medications on file as of 05/24/2024.   Hearing/Vision screen Hearing Screening - Comments:: No problem with hearing Vision Screening - Comments:: Patient has been having a little blurry with her vision and she is going to eye doctor Immunizations and Health Maintenance Health Maintenance  Topic Date Due   DTaP/Tdap/Td (3 - Td or Tdap) 05/02/2023   Diabetic kidney evaluation - Urine ACR  04/13/2024   Medicare Annual Wellness (AWV)  05/18/2024   Influenza Vaccine  10/02/2024 (Originally 02/03/2024)   HEMOGLOBIN A1C  07/14/2024   FOOT EXAM  08/28/2024   OPHTHALMOLOGY EXAM  09/18/2024   Diabetic kidney evaluation - eGFR measurement  04/26/2025   Pneumococcal Vaccine: 50+ Years  Completed   Bone Density Scan  Completed   Meningococcal B Vaccine  Aged Out   COVID-19 Vaccine  Discontinued   Hepatitis C Screening  Discontinued   Zoster Vaccines- Shingrix  Discontinued        Assessment/Plan:  This is a routine wellness examination for Annette Suarez.  Patient Care Team: Gil Greig BRAVO, NP as PCP - General (Adult Health Nurse Practitioner) Santo Stanly LABOR, MD as PCP - Cardiology (Cardiology) Cleotilde Sewer, OD (Optometry) Nicholaus Sherlyn CROME, NP as Nurse Practitioner  I have personally reviewed and noted the following in the patient's chart:   Medical and social history Use of alcohol, tobacco or illicit drugs  Current medications and supplements including opioid prescriptions. Functional ability and status Nutritional status Physical activity Advanced  directives List of other physicians Hospitalizations, surgeries, and ER visits in previous 12 months Vitals Screenings to include cognitive, depression, and falls Referrals and appointments  No orders of the defined types were placed in this encounter.  In addition, I have reviewed and discussed with patient certain preventive protocols, quality metrics, and best practice recommendations. A written personalized care plan for preventive services as well as general preventive health recommendations were provided to patient.   Greig BRAVO Gil, NP   05/24/2024   No follow-ups on file.  After Visit Summary: (MyChart) Due to this being a telephonic visit, the after visit summary with patients personalized plan was offered to patient via MyChart   Nurse Notes: Planning on getting mammogram at end of year. Will schedule bone density. Plan to have urine microalbumin with endocrinology. 6CIT score was 2.

## 2024-05-25 ENCOUNTER — Ambulatory Visit: Payer: Self-pay | Admitting: Rheumatology

## 2024-05-25 LAB — CBC WITH DIFFERENTIAL/PLATELET
Absolute Lymphocytes: 2228 {cells}/uL (ref 850–3900)
Absolute Monocytes: 653 {cells}/uL (ref 200–950)
Basophils Absolute: 89 {cells}/uL (ref 0–200)
Basophils Relative: 0.9 %
Eosinophils Absolute: 267 {cells}/uL (ref 15–500)
Eosinophils Relative: 2.7 %
HCT: 36.1 % (ref 35.0–45.0)
Hemoglobin: 12.3 g/dL (ref 11.7–15.5)
MCH: 31.8 pg (ref 27.0–33.0)
MCHC: 34.1 g/dL (ref 32.0–36.0)
MCV: 93.3 fL (ref 80.0–100.0)
MPV: 11 fL (ref 7.5–12.5)
Monocytes Relative: 6.6 %
Neutro Abs: 6663 {cells}/uL (ref 1500–7800)
Neutrophils Relative %: 67.3 %
Platelets: 344 Thousand/uL (ref 140–400)
RBC: 3.87 Million/uL (ref 3.80–5.10)
RDW: 12.3 % (ref 11.0–15.0)
Total Lymphocyte: 22.5 %
WBC: 9.9 Thousand/uL (ref 3.8–10.8)

## 2024-05-25 LAB — COMPREHENSIVE METABOLIC PANEL WITH GFR
AG Ratio: 1.8 (calc) (ref 1.0–2.5)
ALT: 7 U/L (ref 6–29)
AST: 10 U/L (ref 10–35)
Albumin: 4.3 g/dL (ref 3.6–5.1)
Alkaline phosphatase (APISO): 81 U/L (ref 37–153)
BUN/Creatinine Ratio: 20 (calc) (ref 6–22)
BUN: 26 mg/dL — ABNORMAL HIGH (ref 7–25)
CO2: 32 mmol/L (ref 20–32)
Calcium: 9.3 mg/dL (ref 8.6–10.4)
Chloride: 102 mmol/L (ref 98–110)
Creat: 1.28 mg/dL — ABNORMAL HIGH (ref 0.60–0.95)
Globulin: 2.4 g/dL (ref 1.9–3.7)
Glucose, Bld: 79 mg/dL (ref 65–99)
Potassium: 4.5 mmol/L (ref 3.5–5.3)
Sodium: 140 mmol/L (ref 135–146)
Total Bilirubin: 1.3 mg/dL — ABNORMAL HIGH (ref 0.2–1.2)
Total Protein: 6.7 g/dL (ref 6.1–8.1)
eGFR: 42 mL/min/1.73m2 — ABNORMAL LOW (ref >=60)

## 2024-05-25 NOTE — Progress Notes (Signed)
 CBC normal, creatinine remains elevated and stable.  Please forward results to her PCP.

## 2024-05-28 ENCOUNTER — Ambulatory Visit: Attending: Surgery | Admitting: Physician Assistant

## 2024-05-28 VITALS — BP 164/81 | HR 91 | Temp 97.7°F | Wt 154.7 lb

## 2024-05-28 DIAGNOSIS — I708 Atherosclerosis of other arteries: Secondary | ICD-10-CM

## 2024-05-29 ENCOUNTER — Other Ambulatory Visit: Payer: Self-pay | Admitting: Orthopedic Surgery

## 2024-05-29 ENCOUNTER — Telehealth: Payer: Self-pay | Admitting: Neurology

## 2024-05-29 ENCOUNTER — Other Ambulatory Visit: Payer: Self-pay | Admitting: Surgery

## 2024-05-29 DIAGNOSIS — G4709 Other insomnia: Secondary | ICD-10-CM

## 2024-05-29 DIAGNOSIS — F419 Anxiety disorder, unspecified: Secondary | ICD-10-CM

## 2024-05-29 DIAGNOSIS — I708 Atherosclerosis of other arteries: Secondary | ICD-10-CM

## 2024-05-29 NOTE — Telephone Encounter (Signed)
 Patient said have had bypass surgery and feels do not need appointment on 09/04/24 with Dr. Rosemarie. Cancelling ultrasound on scheduled 07/31/24. Physician did the surgery has schedule an ultrasound and do not need to 2 ultrasound appointments.

## 2024-05-29 NOTE — Telephone Encounter (Signed)
 Patient is requesting a refill of the following medications: Requested Prescriptions   Pending Prescriptions Disp Refills   LORazepam  (ATIVAN ) 0.5 MG tablet [Pharmacy Med Name: LORAZEPAM  0.5 MG TABLET] 30 tablet 1    Sig: TAKE 1 TABLET BY MOUTH AT BEDTIME    Date of last refill: 03/27/2024  Refill amount: 30/1  Treatment agreement date: Patient has a future patient on 11/01/2024 to update contact.

## 2024-06-12 ENCOUNTER — Telehealth: Payer: Self-pay

## 2024-06-12 MED ORDER — OZEMPIC (0.25 OR 0.5 MG/DOSE) 2 MG/1.5ML ~~LOC~~ SOPN: 0.5000 mg | PEN_INJECTOR | SUBCUTANEOUS | 0 refills | Status: DC

## 2024-06-12 NOTE — Telephone Encounter (Signed)
 Spoke with patient.  She says [employer] has no record of her patient assistance since 2024.  Advised patient the this program wont be available come the first of the year.  Ozempic  script sent to pharmacy.  Advised patient to see how much the medication will cost and if she cannot afford it to let us  know.

## 2024-06-12 NOTE — Telephone Encounter (Signed)
 Patient called to get her ozempic  refilled.  She uses patient assistance with [employer] [facility].  No medications in the fridge.  Advised patient to call [employer] [facility] and see why she hasn't received her medicaiton.

## 2024-06-12 NOTE — Addendum Note (Signed)
 Addended by: Carolynn Tuley M on: 06/12/2024 03:45 PM   Modules accepted: Orders

## 2024-06-13 ENCOUNTER — Other Ambulatory Visit: Payer: Self-pay | Admitting: Internal Medicine

## 2024-07-02 ENCOUNTER — Other Ambulatory Visit

## 2024-07-02 ENCOUNTER — Ambulatory Visit: Admitting: Internal Medicine

## 2024-07-02 ENCOUNTER — Encounter: Payer: Self-pay | Admitting: Internal Medicine

## 2024-07-02 VITALS — BP 152/84 | Ht 61.0 in | Wt 153.0 lb

## 2024-07-02 DIAGNOSIS — Z794 Long term (current) use of insulin: Secondary | ICD-10-CM

## 2024-07-02 DIAGNOSIS — E1165 Type 2 diabetes mellitus with hyperglycemia: Secondary | ICD-10-CM

## 2024-07-02 LAB — POCT GLYCOSYLATED HEMOGLOBIN (HGB A1C): Hemoglobin A1C: 7.7 % — AB (ref 4.0–5.6)

## 2024-07-02 MED ORDER — TIRZEPATIDE 5 MG/0.5ML ~~LOC~~ SOAJ: 5.0000 mg | SUBCUTANEOUS | 3 refills | Status: AC

## 2024-07-02 NOTE — Progress Notes (Unsigned)
 "   Name: Annette Suarez  Age/ Sex: 83 y.o., female   MRN/ DOB: [id number], April 26, 1941     PCP: Gil Greig BRAVO, NP   Reason for Endocrinology Evaluation: Type 2 Diabetes Mellitus  Initial Endocrine Consultative Visit: 05/11/2021    PATIENT IDENTIFIER: Annette Suarez is a 83 y.o. female with a past medical history of T2DM, HTN, RA and dyslipidemia. The patient has followed with Endocrinology clinic since 05/11/2021 for consultative assistance with management of her diabetes.  DIABETIC HISTORY:  Ms. Helms was diagnosed with DM in ~ 1995 . She is intolerant to Metformin- nausea and Jardiance due to UTI's .Her hemoglobin A1c has ranged from 7.5% in 2021, peaking at 8.6% in 2023.  Transitioned care from Dr. Kassie 08/2021  Started trulicity  in 05/2021 but subsequently switched to Ozempic     THYROID  HISTORY : She has been diagnosed with MNG based on thyroid  ultrasound in 2011 showing multiple thyroid  nodules. She is s/p benign cytology of the left middle and left inferior nodules in 2011. Ultrasound in 2021 confirmed at least a 5 year stability    SUBJECTIVE:   During the last visit (02/13/2024): A1c 8.3 %     Today (07/02/2024): [name] is here to establish care for diabetes. She has been checking blood sugars multiple times daily through CGM. The patient has  had hypoglycemic episodes since the last clinic visit. She is symptomatic with these episodes.   She is s/p left carotid to left subclavian bypass in October, 2025.  She has been following up with vascular and vein specialist  She continues to follow-up with rheumatology (Dr. Monna) for seronegative rheumatoid arthritis, and osteoarthritis  Dizziness has improved  No nausea Has occasional alternating constipation and diarrhea   H/OME DIABETES REGIMEN:  Tresiba  60 units daily - takes 44 units  Ozempic  0.5 mg weekly ( Weekly)  Humalog  6 units TIDQAC CF: Humalog  (BG-130/35)     Statin: yes ACE-I/ARB: yes     CONTINUOUS  GLUCOSE MONITORING RECORD INTERPRETATION    Dates of Recording: 12/16-12/29/2025  Sensor description:freestyle libre  Results statistics:   CGM use % of time 97  Average and SD 190/32.4  Time in range 47 %  % Time Above 180 36  % Time above 250 17  % Time Below target 0     Glycemic patterns summary: BGs are optimal overnight and fluctuate during the day Hyperglycemic episodes mostly postprandial  Hypoglycemic episodes occurred N/A  Overnight periods: Optimal   DIABETIC COMPLICATIONS: Microvascular complications:  CKD III Denies: retinopathy, neuropathy  Last Eye Exam: Completed 07/2021  Macrovascular complications:   Denies: CAD, CVA, PVD   HISTORY:  Past Medical History:  Past Medical History:  Diagnosis Date   Acute cystitis    UTI   Anxiety    Arthritis    Benign positional vertigo    Coronary artery disease    Nonobstructive   Dermatitis    Diabetes mellitus without complication (HCC)    Family history of adverse reaction to anesthesia    pt reports her sister had adverse reaction to nerve block   GERD (gastroesophageal reflux disease)    Goiter, nontoxic, multinodular    Gout    H/O: hysterectomy    History of tobacco use    Hx laparoscopic cholecystectomy    Hypercholesterolemia    Hypertension    Incomplete bladder emptying    Insomnia    Nausea & vomiting 09/08/2017   Peripheral vascular disease  Postmenopausal atrophic vaginitis    RA (rheumatoid arthritis) (HCC)    Rectocele    Renal calculus    Skin cancer    Sleep apnea    Thyroid  condition    Type 2 diabetes mellitus (HCC)    Urolithiasis    Vaginal enterocele    Past Surgical History:  Past Surgical History:  Procedure Laterality Date   AORTIC ARCH ANGIOGRAPHY N/A 10/22/2022   Procedure: AORTIC ARCH ANGIOGRAPHY;  Surgeon: Dann Candyce RAMAN, MD;  Location: [facility];  Service: Cardiovascular;  Laterality: N/A;   AORTIC ARCH ANGIOGRAPHY N/A 12/28/2023    Procedure: AORTIC ARCH ANGIOGRAPHY;  Surgeon: Darron Deatrice LABOR, MD;  Location: [facility];  Service: Cardiovascular;  Laterality: N/A;   CAROTID-SUBCLAVIAN BYPASS GRAFT Left 04/25/2024   Procedure: CREATION, BYPASS, ARTERIAL, SUBCLAVIAN TO CAROTID, USING X 30CM HEMASHIELD GOLD GRAFT;  Surgeon: Serene Gaile ORN, MD;  Location: [facility] OR;  Service: Vascular;  Laterality: Left;   CARPAL TUNNEL RELEASE  2018   CATARACT EXTRACTION, BILATERAL     CHOLECYSTECTOMY     COLONOSCOPY     07/11/2009, 02/2015   GALLBLADDER SURGERY     KNEE ARTHROSCOPY Right 05/20/2020   Procedure: RIGHT KNEE ARTHROSCOPY AND DEBRIDEMENT;  Surgeon: Harden Jerona GAILS, MD;  Location: [facility];  Service: Orthopedics;  Laterality: Right;   PARTIAL HYSTERECTOMY     [place] left intact   RIGHT/LEFT HEART CATH AND CORONARY ANGIOGRAPHY N/A 10/22/2022   Procedure: RIGHT/LEFT HEART CATH AND CORONARY ANGIOGRAPHY;  Surgeon: Dann Candyce RAMAN, MD;  Location: [facility] CV LAB;  Service: Cardiovascular;  Laterality: N/A;   SQUAMOUS CELL CARCINOMA EXCISION  05/2018   foot   Social History:  reports that she quit smoking about 46 years ago. Her smoking use included cigarettes. She started smoking about 56 years ago. She has a 2 pack-year smoking history. She has been exposed to tobacco smoke. She has never used smokeless tobacco. She reports that she does not drink alcohol and does not use drugs. Family History:  Family History  Problem Relation Age of Onset   Diabetes Mother    Heart disease Father 33   Diabetes Son    Crohn's disease Son      HOME MEDICATIONS: Allergies as of 07/02/2024       Reactions   Latex Rash   Amaryl [glimepiride]    Don't Remember   Amlodipine     LE edema on 7.5mg  daily. Tolerates 5mg  daily just fine   Codeine    SICK   Jardiance [empagliflozin]    Urinary tract infection- SIDE EFFECT   Pneumovax 23 [pneumococcal Vac Polyvalent]    Red / Swelling / Hot to touch SIDE  EFFECTS   Wellbutrin [bupropion]    Unknown        Medication List        Accurate as of July 02, 2024 11:11 AM. If you have any questions, ask your nurse or doctor.          Accu-Chek Guide Test test strip Generic drug: glucose blood Check blood sugar three times daily   Accu-Chek Softclix Lancets lancets Use to test blood sugar three times daily. Dx: E11.8   aspirin  EC 81 MG tablet Take 81 mg by mouth daily.   Biotin 1 MG Caps Take 1 mg by mouth daily.   Blink Tears 0.25 % Soln Generic drug: Polyethylene Glycol 400 Place 1 drop into both eyes at bedtime as needed (stratchy eyes).  carvedilol  12.5 MG tablet Commonly known as: COREG  Take 1 tablet (12.5 mg total) by mouth 2 (two) times daily.   chlorthalidone  25 MG tablet Commonly known as: HYGROTON  TAKE 1 TABLET BY MOUTH DAILY   clobetasol  cream 0.05 % Commonly known as: TEMOVATE  Apply 1 Application topically daily as needed (Rash).   Droplet Pen Needles 31G X 5 MM Misc Generic drug: Insulin  Pen Needle USE TO ADMINISTER INSULIN  4 TIMES DAILY   Enbrel  SureClick 50 MG/ML injection Generic drug: etanercept  Inject 50 mg into the skin once a week.   estradiol  0.1 MG/GM vaginal cream Commonly known as: ESTRACE  Place 1 Applicatorful vaginally 3 (three) times a week.   FreeStyle Libre 3 Plus Sensor Misc 1 Device by Other route every 14 (fourteen) days. Change sensor every 15 days.   FreeStyle Libre 3 Reader Espiridion USE WITH LIBRE SENSORS   insulin  lispro 100 UNIT/ML KwikPen Commonly known as: HUMALOG  INJECT UNDER THE SKIN PER SLIDING SCALE THREE TIMES A DAY WITH MEALS AND EVERY NIGHT AT BEDTIME **MAX 30 UNITS DAILY   irbesartan  300 MG tablet Commonly known as: AVAPRO  TAKE 1 TABLET BY MOUTH DAILY   loperamide  2 MG tablet Commonly known as: IMODIUM  A-D Take 2 mg by mouth daily as needed for diarrhea or loose stools.   LORazepam  0.5 MG tablet Commonly known as: ATIVAN  TAKE 1 TABLET BY MOUTH AT  BEDTIME   MAGNESIUM CITRATE PO Take 250 mg by mouth daily as needed (leg cramps).   methenamine  1 g tablet Commonly known as: HIPREX Take 1 g by mouth 2 (two) times daily with a meal.   nitroGLYCERIN  0.4 MG SL tablet Commonly known as: NITROSTAT  Place 1 tablet (0.4 mg total) under the tongue every 5 (five) minutes as needed for chest pain.   omeprazole 20 MG Tbdd disintegrating tablet Take 20 mg by mouth daily.   ondansetron  4 MG tablet Commonly known as: ZOFRAN  TAKE 1 TABLET BY MOUTH 3 TIMES A DAY AS NEEDED FOR NAUSEA AND/OR VOMITING   Ozempic  (0.25 or 0.5 MG/DOSE) 2 MG/1.5ML Sopn Generic drug: Semaglutide (0.25 or 0.5MG /DOS) Inject 0.5 mg into the skin once a week.   PROBIOTIC PO Take 1 tablet by mouth daily.   simvastatin  20 MG tablet Commonly known as: ZOCOR  TAKE 1 TABLET BY MOUTH EVERY EVENING   Tresiba  FlexTouch 200 UNIT/ML FlexTouch Pen Generic drug: insulin  degludec INJECT UNDER THE SKIN 60 UNITS DAILY IN THE AFTERNOON   Vitamin D3 50 MCG (2000 UT) capsule Take 2,000 Units by mouth daily.   VOLTAREN EX Apply 1 application  topically 3 (three) times daily as needed (Arthritis).         OBJECTIVE:   Vital Signs: BP (!) 152/84   Ht 5' 1 (1.549 m)   Wt 153 lb (69.4 kg)   BMI 28.91 kg/m   Wt Readings from Last 3 Encounters:  07/02/24 153 lb (69.4 kg)  05/28/24 154 lb 11.2 oz (70.2 kg)  05/24/24 154 lb 9.6 oz (70.1 kg)     Exam: General: Pt appears well and is in NAD  Lungs: Clear with good BS bilat   Heart: RRR with norm   Extremities: No pretibial edema.  Neuro: MS is good with appropriate affect, pt is alert and Ox3   DM Foot Exam 07/02/2024  The skin of the feet is intact without sores or ulcerations. The pedal pulses are 2+ on right and 2+ on left. The sensation is intact  to a screening 5.07, 10 gram monofilament bilaterally  DATA REVIEWED:  Lab Results  Component Value Date   HGBA1C 8.3 (H) 01/12/2024   HGBA1C 8.7 (A)  11/30/2023   HGBA1C 8.7 (A) 08/29/2023    Latest Reference Range & Units 07/02/24 11:37  Microalb, Ur mg/dL 1.7  MICROALB/CREAT RATIO <30 mg/g creat 20  Creatinine, Urine 20 - 275 mg/dL 84      Latest Reference Range & Units 02/08/24 16:03  Sodium 135 - 146 mmol/L 140  Potassium 3.5 - 5.3 mmol/L 4.1  Chloride 98 - 110 mmol/L 99  CO2 20 - 32 mmol/L 32  Glucose 65 - 99 mg/dL 86  BUN 7 - 25 mg/dL 29 (H)  Creatinine 9.39 - 0.95 mg/dL 8.87 (H)  Calcium  8.6 - 10.4 mg/dL 9.8  BUN/Creatinine Ratio 6 - 22 (calc) 26 (H)  eGFR > OR = 60 mL/min/1.60m2 50 (L)     ASSESSMENT / PLAN / RECOMMENDATIONS:   1) Type 2 Diabetes Mellitus, Sub-Optimally controlled, With CKD III and neuropathic complications - Most recent A1c of 7.7 %. Goal A1c < 7.5 %.    -A1c is trending down but continues to be above goal -We have not been able to increase Ozempic  due to hair loss, she has been on patient assistance through [employer], but due to recent changes in the rules, the patient will have to obtain her GLP-1 agonist to the pharmacy.  I did recommend trying Mounjaro to see if she tolerates this better than the Ozempic  -She was on Trulicity  with no clinical improvement - Limited exercise due to knee pains - She is intolerant to Jardiance  - In reviewing freestyle guardian life insurance, patient has been with tight BG's overnight, I will decrease Tresiba  further as below  MEDICATIONS: Switch Ozempic  to Mounjaro 5 mg weekly Decrease Tresiba  40 units daily Continue Humalog  6 units with each meal  Continue CF: Humalog  (BG-130/35) TIDQAC and QHS  EDUCATION / INSTRUCTIONS: BG monitoring instructions: Patient is instructed to check her blood sugars 2 times a day, fasting and suppertime. Call [facility] if: BG persistently < 70  I reviewed the Rule of 15 for the treatment of hypoglycemia in detail with the patient. Literature supplied.   2) Diabetic complications:  Eye: Does not have known  diabetic retinopathy.  Neuro/ Feet: Does have known diabetic peripheral neuropathy .  Renal: Patient does  have known baseline CKD. She   is  on an ACEI/ARB at present.    3) Dyslipidemia :   -  LDL  has been at goal , no change    Medication  Simvastatin  20 mg daily     F/U in 4 months    I spent 25 minutes preparing to see the patient by review of recent labs, imaging and procedures, obtaining and reviewing separately obtained history, communicating with the patient, ordering medications, tests or procedures, and documenting clinical information in the EHR including the differential Dx, treatment, and any further evaluation and other management       Signed electronically by: Stefano Redgie Butts, MD  [facility] End[facility]  [facility] [address] [place], [place] [address] Phone: [phone] FAX: [phone]   CC: Gil Greig BRAVO, NP [address] [place] [address] Phone: [phone]  Fax: [phone]  Return to Endocrinology clinic as below: Future Appointments  Date Time Provider Department Center  07/09/2024  8:00 AM [facility] 1 [facility] H&V  07/23/2024  8:45 AM [facility] PA-2 [facility] H&V  07/31/2024 10:00 AM [facility] US  1 -  OUTPATIENT ONLY [facility]  09/05/2024 10:10 AM Cheryl Waddell HERO, PA-C [facility] None  11/01/2024 10:40 AM Gil Greig BRAVO, NP [facility] [address]  12/10/2024  3:30 PM Rosemarie Eather RAMAN, MD [facility] None  06/06/2025 10:40 AM Gil Greig BRAVO, NP [facility] [address]    "

## 2024-07-02 NOTE — Patient Instructions (Addendum)
 Switch Ozempic  to Mounjaro 5 mg weekly Decrease Tresiba  40 units ONCE Daily  Take Humalog  6 units with each meal PLUS add extra if needed depending on the scale  Humalog  correctional insulin : Use the scale below to help guide you before each meal   Blood sugar before meal Number of units to inject  Less than 165 0 unit  166 -  200 1 units  201 -  235 2 units  236 -  270 3 units  271 -  305 4 units  306 -  340 5 units  341 -  375 6 units  376 -  410 7 units  411 -  445 8 units     HOW TO TREAT LOW BLOOD SUGARS (Blood sugar LESS THAN 70 MG/DL) Please follow the RULE OF 15 for the treatment of hypoglycemia treatment (when your (blood sugars are less than 70 mg/dL)   STEP 1: Take 15 grams of carbohydrates when your blood sugar is low, which includes:  3-4 GLUCOSE TABS  OR 3-4 OZ OF JUICE OR REGULAR SODA OR ONE TUBE OF GLUCOSE GEL    STEP 2: RECHECK blood sugar in 15 MINUTES STEP 3: If your blood sugar is still low at the 15 minute recheck --> then, go back to STEP 1 and treat AGAIN with another 15 grams of carbohydrates.

## 2024-07-03 ENCOUNTER — Ambulatory Visit: Payer: Self-pay | Admitting: Internal Medicine

## 2024-07-03 LAB — MICROALBUMIN / CREATININE URINE RATIO
Creatinine, Urine: 84 mg/dL (ref 20–275)
Microalb Creat Ratio: 20 mg/g{creat}
Microalb, Ur: 1.7 mg/dL

## 2024-07-04 ENCOUNTER — Other Ambulatory Visit: Payer: Self-pay | Admitting: Internal Medicine

## 2024-07-09 ENCOUNTER — Ambulatory Visit (HOSPITAL_COMMUNITY)
Admission: RE | Admit: 2024-07-09 | Discharge: 2024-07-09 | Disposition: A | Discharge status: Home / Self Care | Source: Ambulatory Visit | Attending: Surgery | Admitting: Surgery

## 2024-07-09 ENCOUNTER — Ambulatory Visit

## 2024-07-09 DIAGNOSIS — I708 Atherosclerosis of other arteries: Secondary | ICD-10-CM | POA: Diagnosis not present

## 2024-07-10 ENCOUNTER — Ambulatory Visit: Admitting: Neurology

## 2024-07-13 ENCOUNTER — Telehealth: Payer: Self-pay

## 2024-07-13 MED ORDER — TOUJEO SOLOSTAR 300 UNIT/ML ~~LOC~~ SOPN: 40.0000 [IU] | PEN_INJECTOR | Freq: Every day | SUBCUTANEOUS | 4 refills | Status: AC

## 2024-07-13 NOTE — Telephone Encounter (Signed)
 Notification received that patients insurance no longer covers tresiba .  Alternatives are lantus  or toujeo .

## 2024-07-16 ENCOUNTER — Telehealth: Payer: Self-pay

## 2024-07-16 NOTE — Telephone Encounter (Signed)
 Submitted Patient Assistance Application to [employer] for ENBREL  along with patient portion, provider portion, insurance card copy, prior authorization approval, current medication list, and income documents. Will update patient when we receive a response.  Phone #: [phone] Fax #: [phone]

## 2024-07-16 NOTE — Telephone Encounter (Signed)
 Pt arrived in clinic and signed her portion of the application, she was then provided with an Enbrel  Sureclick sample pen which was signed out of logbook #1.  Attempted to submit a Prior Authorization request to [employer] for ENBREL  via CoverMyMeds, however request was cancelled stating that medication is covered under [id number] from 07/05/2024 to 07/04/2025. Will await PA cancellation letter to submit with application.  Key: [id number]

## 2024-07-16 NOTE — Telephone Encounter (Signed)
 Letter from [employer] gelene approval) printed but also sent to media tab

## 2024-07-16 NOTE — Telephone Encounter (Signed)
 Pt contacted office regarding Enbrel  PAP for 2026. She also informs me that she is almost out of medication. Advised that I will draft up paperwork and that she can come to the clinic to pick up sample and sign her portion of the application. Provider portion has been signed.

## 2024-07-23 ENCOUNTER — Ambulatory Visit: Admitting: Physician Assistant

## 2024-07-23 VITALS — BP 146/81 | Ht 61.0 in | Wt 152.2 lb

## 2024-07-23 DIAGNOSIS — I708 Atherosclerosis of other arteries: Secondary | ICD-10-CM

## 2024-07-23 NOTE — Progress Notes (Signed)
 " POST OPERATIVE OFFICE NOTE    CC:  F/u for surgery  HPI:  Annette Suarez is a 84 y.o. female who is here for follow-up.  She recently underwent left carotid to left subclavian artery bypass with 6 mm dacryon graft on 04/25/2024 by Dr. Serene.  This was done for subclavian steal with syncopal type episodes and left hand tingling.  At her first postop visit she reported that her dizziness and left arm tingling resolved.  She still had some generalized fatigue and a little bit of lightheadedness if she bent over too quickly.  She returns today for follow-up.  She says that she continues to have no issues with syncopal type episodes or left hand tingling.  She does report some generalized fatigue still, especially if she is out for a lot of the day.  She also still has some lightheadedness if she bends over too quickly.  Otherwise she denies any slurred speech, facial droop, sudden unilateral weakness/numbness, or sudden visual changes.  She takes a daily aspirin  and statin.  She is a former smoker.   Allergies[1]  Current Outpatient Medications  Medication Sig Dispense Refill   Accu-Chek Softclix Lancets lancets Use to test blood sugar three times daily. Dx: E11.8 300 each 1   aspirin  EC 81 MG tablet Take 81 mg by mouth daily.     Biotin 1 MG CAPS Take 1 mg by mouth daily.     carvedilol  (COREG ) 12.5 MG tablet Take 1 tablet (12.5 mg total) by mouth 2 (two) times daily. 180 tablet 3   chlorthalidone  (HYGROTON ) 25 MG tablet TAKE 1 TABLET BY MOUTH DAILY 90 tablet 3   Cholecalciferol (VITAMIN D3) 50 MCG (2000 UT) capsule Take 2,000 Units by mouth daily.     clobetasol  cream (TEMOVATE ) 0.05 % Apply 1 Application topically daily as needed (Rash).     Continuous Glucose Receiver (FREESTYLE LIBRE 3 READER) DEVI USE WITH LIBRE SENSORS 1 each 0   Continuous Glucose Sensor (FREESTYLE LIBRE 3 PLUS SENSOR) MISC 1 Device by Other route every 14 (fourteen) days. Change sensor every 15 days. 6 each 3    Diclofenac Sodium (VOLTAREN EX) Apply 1 application  topically 3 (three) times daily as needed (Arthritis).     estradiol  (ESTRACE ) 0.1 MG/GM vaginal cream Place 1 Applicatorful vaginally 3 (three) times a week. 42.5 g 0   etanercept  (ENBREL  SURECLICK) 50 MG/ML injection Inject 50 mg into the skin once a week. 12 mL 0   glucose blood (ACCU-CHEK GUIDE TEST) test strip Check blood sugar three times daily 100 each 12   insulin  glargine, 1 Unit Dial , (TOUJEO  SOLOSTAR) 300 UNIT/ML Solostar Pen Inject 40 Units into the skin daily in the afternoon. 15 mL 4   insulin  lispro (HUMALOG ) 100 UNIT/ML KwikPen INJECT UNDER THE SKIN PER SLIDING SCALE THREE TIMES A DAY WITH MEALS AND EVERY NIGHT AT BEDTIME **MAX 30 UNITS DAILY 9 mL 1   Insulin  Pen Needle (DROPLET PEN NEEDLES) 31G X 5 MM MISC USE TO ADMINISTER INSULIN  4 TIMES DAILY 100 each 2   irbesartan  (AVAPRO ) 300 MG tablet TAKE 1 TABLET BY MOUTH DAILY 90 tablet 3   loperamide  (IMODIUM  A-D) 2 MG tablet Take 2 mg by mouth daily as needed for diarrhea or loose stools.     LORazepam  (ATIVAN ) 0.5 MG tablet TAKE 1 TABLET BY MOUTH AT BEDTIME 30 tablet 1   MAGNESIUM CITRATE PO Take 250 mg by mouth daily as needed (leg cramps).     methenamine  (  HIPREX) 1 g tablet Take 1 g by mouth 2 (two) times daily with a meal.     nitroGLYCERIN  (NITROSTAT ) 0.4 MG SL tablet Place 1 tablet (0.4 mg total) under the tongue every 5 (five) minutes as needed for chest pain. 30 tablet 3   Omeprazole 20 MG TBDD Take 20 mg by mouth daily.     ondansetron  (ZOFRAN ) 4 MG tablet TAKE 1 TABLET BY MOUTH 3 TIMES A DAY AS NEEDED FOR NAUSEA AND/OR VOMITING 20 tablet 0   Polyethylene Glycol 400 (BLINK TEARS) 0.25 % SOLN Place 1 drop into both eyes at bedtime as needed (stratchy eyes).     Probiotic Product (PROBIOTIC PO) Take 1 tablet by mouth daily.     Semaglutide ,0.25 or 0.5MG /DOS, (OZEMPIC , 0.25 OR 0.5 MG/DOSE,) 2 MG/3ML SOPN DIAL  AND INJECT UNDER THE SKIN 0.5 MG WEEKLY 9 mL 3   simvastatin  (ZOCOR )  20 MG tablet TAKE 1 TABLET BY MOUTH EVERY EVENING 90 tablet 1   tirzepatide  (MOUNJARO ) 5 MG/0.5ML Pen Inject 5 mg into the skin once a week. 6 mL 3   No current facility-administered medications for this visit.    ROS:  See HPI  Physical Exam:  Incision: Left neck incision well-healed Extremities: 2+ radial pulses bilaterally Neuro: Alert and oriented x 3.  Intact motor and sensation of bilateral upper and lower extremities  Studies: Carotid duplex (07/09/2024) 1 to 39% carotid stenosis bilaterally.  Patent carotid to subclavian artery bypass.   Assessment/Plan:  This is a 84 y.o. female who is here for follow-up  - The patient recently underwent left carotid to subclavian artery bypass in November for subclavian steal with syncopal episodes and left hand tingling.  Her left-sided neck incision is well-healed -Carotid duplex demonstrates 1 to 39% carotid stenosis bilaterally.  She has a widely patent left carotid to left subclavian artery bypass without stenosis - She continues to have some fatigue if she exerts herself a lot during the day.  Otherwise she continues to do much better.  She no longer has any syncopal type episodes or left hand tingling.  She also denies any slurred speech, sudden visual changes, sudden unilateral weakness/numbness, or facial droop - On exam she is alert and oriented x 3.  She is neurologically intact.  She has 2+ radial pulses bilaterally -She will continue to take her aspirin  and statin and can follow-up with our office in 9 months with repeat carotid duplex   Ahmed Holster, PA-C [facility] [phone]   Clinic MD:  Serene     [1]  Allergies Allergen Reactions   Latex Rash   Amaryl [Glimepiride]     Don't Remember   Amlodipine      LE edema on 7.5mg  daily. Tolerates 5mg  daily just fine   Codeine     SICK   Jardiance [Empagliflozin]     Urinary tract infection- SIDE EFFECT   Pneumovax 23 [Pneumococcal Vac Polyvalent]      Red / Swelling / Hot to touch SIDE EFFECTS   Wellbutrin [Bupropion]     Unknown   "

## 2024-07-25 NOTE — Telephone Encounter (Signed)
 Pt provided with another sample of Enbrel  Sureclick 50mg /mL. It has been signed out of logbook #1.

## 2024-07-25 NOTE — Telephone Encounter (Signed)
 Medication Samples have been provided to the patient.  Drug name: Enbrel  Sureclick       Strength: 50 mg        Qty: 1 LOT: [id number]  Exp.Date: 02/01/2026  Dosing instructions: Inject 50 mg into skin once weekly.

## 2024-07-31 ENCOUNTER — Encounter (HOSPITAL_COMMUNITY)

## 2024-08-02 NOTE — Telephone Encounter (Signed)
 Medication Samples have been provided to the patient.  Drug name: Enbrel  Sureclick       Strength: 50 mg        Qty: 1 LOT: [id number]  Exp.Date: 02/01/2026  Dosing instructions: Inject one pen into skin once weekly.

## 2024-08-03 NOTE — Telephone Encounter (Signed)
 Per online status portal, Enbrel  PAP application is under review   [email or web address]

## 2024-08-07 ENCOUNTER — Ambulatory Visit: Admitting: Physician Assistant

## 2024-08-09 ENCOUNTER — Ambulatory Visit: Admitting: Neurology

## 2024-08-09 NOTE — Telephone Encounter (Signed)
 Pt contacted clinic and advised that she has been DENIED enrollment into Enbrel  PAP. I was able to confirm denial via [employer]F portal, however the reason for denial is not explicitly stated--only that she may reapply if your circumstances change.  Pt stated that she would like to discuss with either Dr. Dolphus or Waddell Craze regarding switching therapies. I advised that any other specialty medication she switched to would be just as expensive, however she informed me that she would be comfortable discussing de-escalation of therapy to a non-specialty medication. I assured her that I would send a message to them and that they would either attempt to schedule her for an earlier appointment than 3/4 or would give her a call to discuss alternative treatments.  Of note, I have made a few attempts to discuss the MPPP with her however she has not been receptive. Barring the opening of grants, she will not be able to remain on biologics from a financial perspective.

## 2024-08-09 NOTE — Telephone Encounter (Signed)
 Patient will need an appointment to discuss treatment options.

## 2024-08-09 NOTE — Telephone Encounter (Signed)
 Patient previously had an inadequate response to arava .  Not a good candidate for methotrexate  due to elevated creatinine.  Could consider the use of Plaquenil .

## 2024-09-04 ENCOUNTER — Ambulatory Visit: Admitting: Neurology

## 2024-09-05 ENCOUNTER — Ambulatory Visit: Admitting: Physician Assistant

## 2024-10-30 ENCOUNTER — Ambulatory Visit: Admitting: Internal Medicine

## 2024-11-01 ENCOUNTER — Ambulatory Visit: Admitting: Orthopedic Surgery

## 2024-12-10 ENCOUNTER — Ambulatory Visit: Admitting: Neurology

## 2025-06-06 ENCOUNTER — Ambulatory Visit: Admitting: Orthopedic Surgery
